# Patient Record
Sex: Female | Born: 1950 | Race: White | Hispanic: No | Marital: Married | Smoking: Former smoker
Health system: Southern US, Community
[De-identification: ages and names within clinical notes are randomized; demographics above are authoritative.]

## PROBLEM LIST (undated history)

## (undated) DIAGNOSIS — E079 Disorder of thyroid, unspecified: Secondary | ICD-10-CM

## (undated) DIAGNOSIS — R9431 Abnormal electrocardiogram [ECG] [EKG]: Secondary | ICD-10-CM

## (undated) DIAGNOSIS — K2960 Other gastritis without bleeding: Secondary | ICD-10-CM

## (undated) DIAGNOSIS — I2510 Atherosclerotic heart disease of native coronary artery without angina pectoris: Secondary | ICD-10-CM

## (undated) DIAGNOSIS — E785 Hyperlipidemia, unspecified: Secondary | ICD-10-CM

## (undated) DIAGNOSIS — J449 Chronic obstructive pulmonary disease, unspecified: Secondary | ICD-10-CM

## (undated) DIAGNOSIS — F1910 Other psychoactive substance abuse, uncomplicated: Secondary | ICD-10-CM

## (undated) DIAGNOSIS — C541 Malignant neoplasm of endometrium: Secondary | ICD-10-CM

## (undated) DIAGNOSIS — M1990 Unspecified osteoarthritis, unspecified site: Secondary | ICD-10-CM

## (undated) DIAGNOSIS — I10 Essential (primary) hypertension: Secondary | ICD-10-CM

## (undated) DIAGNOSIS — C801 Malignant (primary) neoplasm, unspecified: Secondary | ICD-10-CM

## (undated) HISTORY — DX: Unspecified osteoarthritis, unspecified site: M19.90

## (undated) HISTORY — DX: Disorder of thyroid, unspecified: E07.9

## (undated) HISTORY — DX: Atherosclerotic heart disease of native coronary artery without angina pectoris: I25.10

## (undated) HISTORY — DX: Abnormal electrocardiogram (ECG) (EKG): R94.31

## (undated) HISTORY — DX: Hyperlipidemia, unspecified: E78.5

## (undated) HISTORY — PX: TOTAL ABDOMINAL HYSTERECTOMY: SHX209

## (undated) HISTORY — DX: Other gastritis without bleeding: K29.60

## (undated) HISTORY — PX: BREAST CYST ASPIRATION: SHX578

## (undated) HISTORY — DX: Malignant neoplasm of endometrium: C54.1

## (undated) HISTORY — DX: Essential (primary) hypertension: I10

## (undated) HISTORY — DX: Malignant (primary) neoplasm, unspecified: C80.1

## (undated) HISTORY — DX: Other psychoactive substance abuse, uncomplicated: F19.10

---

## 2003-04-12 HISTORY — PX: THYROIDECTOMY: SHX17

## 2004-04-11 HISTORY — PX: CARDIAC CATHETERIZATION: SHX172

## 2005-02-09 ENCOUNTER — Ambulatory Visit: Payer: Self-pay | Admitting: General Practice

## 2005-04-05 ENCOUNTER — Ambulatory Visit (HOSPITAL_COMMUNITY): Admission: RE | Admit: 2005-04-05 | Discharge: 2005-04-05 | Payer: Self-pay | Admitting: *Deleted

## 2006-03-07 ENCOUNTER — Ambulatory Visit: Payer: Self-pay | Admitting: General Practice

## 2006-05-18 ENCOUNTER — Other Ambulatory Visit: Payer: Self-pay

## 2006-05-18 ENCOUNTER — Emergency Department: Payer: Self-pay | Admitting: Internal Medicine

## 2007-06-01 ENCOUNTER — Emergency Department: Payer: Self-pay | Admitting: Emergency Medicine

## 2007-06-01 ENCOUNTER — Other Ambulatory Visit: Payer: Self-pay

## 2007-09-22 ENCOUNTER — Emergency Department: Payer: Self-pay | Admitting: Emergency Medicine

## 2007-12-20 ENCOUNTER — Ambulatory Visit: Payer: Self-pay | Admitting: Internal Medicine

## 2008-04-18 ENCOUNTER — Ambulatory Visit: Payer: Self-pay | Admitting: Specialist

## 2009-04-11 DIAGNOSIS — C541 Malignant neoplasm of endometrium: Secondary | ICD-10-CM

## 2009-04-11 HISTORY — DX: Malignant neoplasm of endometrium: C54.1

## 2009-06-09 ENCOUNTER — Ambulatory Visit: Payer: Self-pay | Admitting: Gynecologic Oncology

## 2009-06-16 ENCOUNTER — Ambulatory Visit: Payer: Self-pay | Admitting: Gynecologic Oncology

## 2009-06-19 ENCOUNTER — Ambulatory Visit: Payer: Self-pay | Admitting: Obstetrics and Gynecology

## 2009-07-10 ENCOUNTER — Ambulatory Visit: Payer: Self-pay | Admitting: Gynecologic Oncology

## 2009-07-13 ENCOUNTER — Ambulatory Visit: Payer: Self-pay | Admitting: Obstetrics and Gynecology

## 2009-07-14 ENCOUNTER — Inpatient Hospital Stay: Payer: Self-pay | Admitting: Obstetrics and Gynecology

## 2009-08-09 ENCOUNTER — Ambulatory Visit: Payer: Self-pay | Admitting: Gynecologic Oncology

## 2009-09-09 ENCOUNTER — Ambulatory Visit: Payer: Self-pay | Admitting: Gynecologic Oncology

## 2010-06-05 ENCOUNTER — Emergency Department: Payer: Self-pay | Admitting: Emergency Medicine

## 2011-02-09 ENCOUNTER — Other Ambulatory Visit: Payer: Self-pay | Admitting: Internal Medicine

## 2011-04-20 ENCOUNTER — Ambulatory Visit: Payer: Self-pay | Admitting: Internal Medicine

## 2011-04-27 ENCOUNTER — Ambulatory Visit (INDEPENDENT_AMBULATORY_CARE_PROVIDER_SITE_OTHER): Payer: BC Managed Care – PPO | Admitting: Internal Medicine

## 2011-04-27 ENCOUNTER — Encounter: Payer: Self-pay | Admitting: Internal Medicine

## 2011-04-27 VITALS — BP 146/90 | HR 84 | Temp 98.0°F | Wt 197.0 lb

## 2011-04-27 DIAGNOSIS — E079 Disorder of thyroid, unspecified: Secondary | ICD-10-CM | POA: Insufficient documentation

## 2011-04-27 DIAGNOSIS — Z720 Tobacco use: Secondary | ICD-10-CM

## 2011-04-27 DIAGNOSIS — J449 Chronic obstructive pulmonary disease, unspecified: Secondary | ICD-10-CM

## 2011-04-27 DIAGNOSIS — E039 Hypothyroidism, unspecified: Secondary | ICD-10-CM

## 2011-04-27 DIAGNOSIS — M1990 Unspecified osteoarthritis, unspecified site: Secondary | ICD-10-CM | POA: Insufficient documentation

## 2011-04-27 DIAGNOSIS — Z716 Tobacco abuse counseling: Secondary | ICD-10-CM

## 2011-04-27 DIAGNOSIS — I10 Essential (primary) hypertension: Secondary | ICD-10-CM

## 2011-04-27 DIAGNOSIS — Z1239 Encounter for other screening for malignant neoplasm of breast: Secondary | ICD-10-CM

## 2011-04-27 DIAGNOSIS — K219 Gastro-esophageal reflux disease without esophagitis: Secondary | ICD-10-CM

## 2011-04-27 DIAGNOSIS — C541 Malignant neoplasm of endometrium: Secondary | ICD-10-CM | POA: Insufficient documentation

## 2011-04-27 DIAGNOSIS — Z7189 Other specified counseling: Secondary | ICD-10-CM

## 2011-04-27 DIAGNOSIS — F17200 Nicotine dependence, unspecified, uncomplicated: Secondary | ICD-10-CM

## 2011-04-27 DIAGNOSIS — C801 Malignant (primary) neoplasm, unspecified: Secondary | ICD-10-CM

## 2011-04-27 DIAGNOSIS — E785 Hyperlipidemia, unspecified: Secondary | ICD-10-CM

## 2011-04-27 DIAGNOSIS — A048 Other specified bacterial intestinal infections: Secondary | ICD-10-CM

## 2011-04-27 DIAGNOSIS — Z79899 Other long term (current) drug therapy: Secondary | ICD-10-CM

## 2011-04-27 MED ORDER — ALPRAZOLAM 0.5 MG PO TABS: 0.5000 mg | ORAL_TABLET | Freq: Every evening | ORAL | Status: AC | PRN

## 2011-04-27 MED ORDER — ALPRAZOLAM 0.5 MG PO TBDP: 0.5000 mg | ORAL_TABLET | Freq: Two times a day (BID) | ORAL | Status: DC | PRN

## 2011-04-27 MED ORDER — DULOXETINE HCL 60 MG PO CPEP: 60.0000 mg | ORAL_CAPSULE | Freq: Every day | ORAL | Status: DC

## 2011-04-27 NOTE — Telephone Encounter (Signed)
Xanax called to [facility] 

## 2011-04-27 NOTE — Patient Instructions (Signed)
I am prescribing alprazolam, a mild anxiety medication for you to use as needed fore anxiety, to help you quit smoking.     Gastroesophageal Reflux Disease, Adult Gastroesophageal reflux disease (GERD) happens when acid from your stomach flows up into the esophagus. When acid comes in contact with the esophagus, the acid causes soreness (inflammation) in the esophagus. Over time, GERD may create small holes (ulcers) in the lining of the esophagus. CAUSES   Increased body weight. This puts pressure on the stomach, making acid rise from the stomach into the esophagus.   Smoking. This increases acid production in the stomach.   Drinking alcohol. This causes decreased pressure in the lower esophageal sphincter (valve or ring of muscle between the esophagus and stomach), allowing acid from the stomach into the esophagus.   Late evening meals and a full stomach. This increases pressure and acid production in the stomach.   A malformed lower esophageal sphincter.  Sometimes, no cause is found. SYMPTOMS   Burning pain in the lower part of the mid-chest behind the breastbone and in the mid-stomach area. This may occur twice a week or more often.   Trouble swallowing.   Sore throat.   Dry cough.   Asthma-like symptoms including chest tightness, shortness of breath, or wheezing.  DIAGNOSIS  Your caregiver may be able to diagnose GERD based on your symptoms. In some cases, X-rays and other tests may be done to check for complications or to check the condition of your stomach and esophagus. TREATMENT  Your caregiver may recommend over-the-counter or prescription medicines to help decrease acid production. Ask your caregiver before starting or adding any new medicines.  HOME CARE INSTRUCTIONS   Change the factors that you can control. Ask your caregiver for guidance concerning weight loss, quitting smoking, and alcohol consumption.   Avoid foods and drinks that make your symptoms worse, such  as:   Caffeine or alcoholic drinks.   Chocolate.   Peppermint or mint flavorings.   Garlic and onions.   Spicy foods.   Citrus fruits, such as oranges, lemons, or limes.   Tomato-based foods such as sauce, chili, salsa, and pizza.   Fried and fatty foods.   Avoid lying down for the 3 hours prior to your bedtime or prior to taking a nap.   Eat small, frequent meals instead of large meals.   Wear loose-fitting clothing. Do not wear anything tight around your waist that causes pressure on your stomach.   Raise the head of your bed 6 to 8 inches with wood blocks to help you sleep. Extra pillows will not help.   Only take over-the-counter or prescription medicines for pain, discomfort, or fever as directed by your caregiver.   Do not take aspirin, ibuprofen, or other nonsteroidal anti-inflammatory drugs (NSAIDs).  SEEK IMMEDIATE MEDICAL CARE IF:   You have pain in your arms, neck, jaw, teeth, or back.   Your pain increases or changes in intensity or duration.   You develop nausea, vomiting, or sweating (diaphoresis).   You develop shortness of breath, or you faint.   Your vomit is green, yellow, black, or looks like coffee grounds or blood.   Your stool is red, bloody, or black.  These symptoms could be signs of other problems, such as heart disease, gastric bleeding, or esophageal bleeding. MAKE SURE YOU:   Understand these instructions.   Will watch your condition.   Will get help right away if you are not doing well or get worse.  Document Released: 01/05/2005 Document Revised: 12/08/2010 Document Reviewed: 10/15/2010 [facility] Patient Information 2012 [place], [place].

## 2011-04-27 NOTE — Progress Notes (Signed)
Subjective:    Patient ID: Pamela Romero, female    DOB: 03-May-1950, 61 y.o.   MRN: [id number]  HPI 31 Pamela Romero is a 61 year old white female with a history of hypothyroidism chronic tobacco abuse osteoarthritis venous insufficiency and endometrial cancer who returns for followup after an absence of 14 months her chief complaint today is increased reflux aggravated by fried foods. She has been intermittently successful with tobacco cessation.  . She has also been intermittently successful with weight loss having achieved a weight loss of roughly 80 pounds over the last year and 2 months with some regain due to emotional stressors at home which have interfered with her ability to exercise regularly. Her ability to exercise is also hindered by her chronic foot pain. She is working only one job where previously she was working 2. Home emotional stressors include the fact that her mother Rennie Plowman has been admitted to behavioral health during an alcohol binge with suicidal tendencies. She states that her mother has been drinking and abusing alcohol steadily for the last year or so and her brother is her drinking partner.   Past Medical History  Diagnosis Date  . Hypertension   . Hyperlipidemia   . Reflux gastritis   . Vaginal delivery     x 2  . CAD (coronary artery disease)     non critical, by cardiac cath 2006  . Abnormal EKG   . Endometrial adenocarcinoma 2011  . Thyroid disease   . Osteoarthritis   . Cancer     endometrial Ca   Current Outpatient Prescriptions on File Prior to Visit  Medication Sig Dispense Refill  . traMADol (ULTRAM) 50 MG tablet TAKE 2 TABLETS BY MOUTH TWICE A DAY AS NEEDED  120 tablet  2    Review of Systems  Constitutional: Negative for fever, chills and unexpected weight change.  HENT: Negative for hearing loss, ear pain, nosebleeds, congestion, sore throat, facial swelling, rhinorrhea, sneezing, mouth sores, trouble swallowing, neck pain, neck stiffness,  voice change, postnasal drip, sinus pressure, tinnitus and ear discharge.   Eyes: Negative for pain, discharge, redness and visual disturbance.  Respiratory: Negative for cough, chest tightness, shortness of breath, wheezing and stridor.   Cardiovascular: Negative for chest pain, palpitations and leg swelling.  Musculoskeletal: Positive for arthralgias. Negative for myalgias.  Skin: Negative for color change and rash.  Neurological: Negative for dizziness, weakness, light-headedness and headaches.  Hematological: Negative for adenopathy.   BP 146/90  Pulse 84  Temp(Src) 98 F (36.7 C) (Oral)  Wt 197 lb (89.359 kg)  SpO2 97%     Objective:   Physical Exam  Constitutional: She is oriented to person, place, and time. She appears well-developed and well-nourished.  HENT:  Mouth/Throat: Oropharynx is clear and moist.  Eyes: EOM are normal. Pupils are equal, round, and reactive to light. No scleral icterus.  Neck: Normal range of motion. Neck supple. No JVD present. No thyromegaly present.  Cardiovascular: Normal rate, regular rhythm, normal heart sounds and intact distal pulses.   Pulmonary/Chest: Effort normal and breath sounds normal.  Abdominal: Soft. Bowel sounds are normal. She exhibits no mass. There is no tenderness.  Musculoskeletal: Normal range of motion. She exhibits no edema.  Lymphadenopathy:    She has no cervical adenopathy.  Neurological: She is alert and oriented to person, place, and time.  Skin: Skin is warm and dry.  Psychiatric: She has a normal mood and affect.      Assessment & Plan:  Cancer Her endometrial cancer is under the care of observation of Dr. Santiago Bur at [facility] oncology. Records requested .  Esophageal reflux Discussed triggers for esophageal reflux and recommended use of omeprazole 20 mg daily. If symptoms are continuing to be present during daily use of omeprazole will refer to gastroenterology for endoscopy to rule out Barrett's  esophagus.  Osteoarthritis Her osteoarthritis is causing constant pain. Her constipation is contributing to her depressive disorder. We discussed changing her antidepressant from Lexapro to Cymbalta as a trial for management of recurrent chronic pain. She is willing to do this samples of Cymbalta 60 mg daily were given to patient and she was instructed to start the day after stopping her Lexapro.  Hypothyroidism She has a history of hypothyroidism with prior episodes of extremely underactive thyroid due to medication noncompliance. She has been on Synthroid for a few days and has not had a TSH checked in over one year. This will be checked today.  Tobacco abuse counseling She is contemplative of quitting tobacco again but has multiple stressors at home that are contributing to her risk for failure and relapsed. She is not interested in Chantix at this time. We discussed getting her pain under control before trying to set a date for her tobacco cessation. We discussed alternative activities that she may do to relieve stress. I have also offered to prescribe for her a mild anxiolytic that she can use as needed during those periods but often drive her to smoke.   Total time of care with patient today was 60 minutes. This included counseling evaluation and management of medications. Updated Medication List Outpatient Encounter Prescriptions as of 04/27/2011  Medication Sig Dispense Refill  . esomeprazole (NEXIUM) 40 MG capsule Take one daily in the evenings      . levothyroxine (SYNTHROID, LEVOTHROID) 200 MCG tablet Take 200 mcg by mouth daily.      . traMADol (ULTRAM) 50 MG tablet TAKE 2 TABLETS BY MOUTH TWICE A DAY AS NEEDED  120 tablet  2  . ALPRAZolam (XANAX) 0.5 MG tablet Take 1 tablet (0.5 mg total) by mouth at bedtime as needed for sleep or anxiety.  30 tablet  3  . DULoxetine (CYMBALTA) 60 MG capsule Take 1 capsule (60 mg total) by mouth daily.  30 capsule  11  . simvastatin (ZOCOR) 20 MG  tablet Take one by mouth daily      . DISCONTD: ALPRAZolam (NIRAVAM) 0.5 MG dissolvable tablet Take 1 tablet (0.5 mg total) by mouth 2 (two) times daily as needed for anxiety.  60 tablet  2

## 2011-04-28 LAB — COMPREHENSIVE METABOLIC PANEL
Alkaline Phosphatase: 85 U/L (ref 39–117)
CO2: 28 mEq/L (ref 19–32)
Creatinine, Ser: 1.2 mg/dL (ref 0.4–1.2)
GFR: 51.4 mL/min — ABNORMAL LOW (ref >60.00)
Glucose, Bld: 83 mg/dL (ref 70–99)
Total Bilirubin: 0.2 mg/dL — ABNORMAL LOW (ref 0.3–1.2)

## 2011-04-28 LAB — H. PYLORI ANTIBODY, IGG: H Pylori IgG: POSITIVE

## 2011-04-29 ENCOUNTER — Telehealth: Payer: Self-pay | Admitting: *Deleted

## 2011-04-29 DIAGNOSIS — E039 Hypothyroidism, unspecified: Secondary | ICD-10-CM | POA: Insufficient documentation

## 2011-04-29 DIAGNOSIS — Z716 Tobacco abuse counseling: Secondary | ICD-10-CM | POA: Insufficient documentation

## 2011-04-29 DIAGNOSIS — I10 Essential (primary) hypertension: Secondary | ICD-10-CM | POA: Insufficient documentation

## 2011-04-29 DIAGNOSIS — A048 Other specified bacterial intestinal infections: Secondary | ICD-10-CM

## 2011-04-29 DIAGNOSIS — K219 Gastro-esophageal reflux disease without esophagitis: Secondary | ICD-10-CM | POA: Insufficient documentation

## 2011-04-29 DIAGNOSIS — Z720 Tobacco use: Secondary | ICD-10-CM | POA: Insufficient documentation

## 2011-04-29 MED ORDER — CLARITHROMYCIN 250 MG PO TABS: 500.0000 mg | ORAL_TABLET | Freq: Two times a day (BID) | ORAL | Status: AC

## 2011-04-29 MED ORDER — OMEPRAZOLE 20 MG PO CPDR: 20.0000 mg | DELAYED_RELEASE_CAPSULE | Freq: Every day | ORAL | Status: DC

## 2011-04-29 MED ORDER — AMOXICILLIN 500 MG PO CAPS: 1000.0000 mg | ORAL_CAPSULE | Freq: Two times a day (BID) | ORAL | Status: AC

## 2011-04-29 MED ORDER — LEVOTHYROXINE SODIUM 25 MCG PO TABS: 25.0000 ug | ORAL_TABLET | Freq: Every day | ORAL | Status: DC

## 2011-04-29 MED ORDER — LEVOTHYROXINE SODIUM 200 MCG PO TABS: 200.0000 ug | ORAL_TABLET | Freq: Every day | ORAL | Status: DC

## 2011-04-29 NOTE — Progress Notes (Signed)
Addended by: Duncan Dull on: 04/29/2011 12:07 PM   Modules accepted: Orders

## 2011-04-29 NOTE — Assessment & Plan Note (Signed)
Her osteoarthritis is causing constant pain. Her constipation is contributing to her depressive disorder. We discussed changing her antidepressant from Lexapro to Cymbalta as a trial for management of recurrent chronic pain. She is willing to do this samples of Cymbalta 60 mg daily were given to patient and she was instructed to start the day after stopping her Lexapro.

## 2011-04-29 NOTE — Assessment & Plan Note (Signed)
Discussed triggers for esophageal reflux and recommended use of omeprazole 20 mg daily. If symptoms are continuing to be present during daily use of omeprazole will refer to gastroenterology for endoscopy to rule out Barrett's esophagus.

## 2011-04-29 NOTE — Assessment & Plan Note (Signed)
She is contemplative of quitting tobacco again but has multiple stressors at home that are contributing to her risk for failure and relapsed. She is not interested in Chantix at this time. We discussed getting her pain under control before trying to set a date for her tobacco cessation. We discussed alternative activities that she may do to relieve stress. I have also offered to prescribe for her a mild anxiolytic that she can use as needed during those periods but often drive her to smoke.

## 2011-04-29 NOTE — Telephone Encounter (Signed)
Advised patient of lab results.  She doesn't have any prilosec so will need that sent to pharmacy along with other meds.  [facility] [place].

## 2011-04-29 NOTE — Assessment & Plan Note (Signed)
She has a history of hypothyroidism with prior episodes of extremely underactive thyroid due to medication noncompliance. She has been on Synthroid for a few days and has not had a TSH checked in over one year. This will be checked today.

## 2011-04-29 NOTE — Assessment & Plan Note (Signed)
Her endometrial cancer is under the care of observation of Dr. Santiago Bur at [facility] oncology. Records requested .

## 2011-04-29 NOTE — Telephone Encounter (Signed)
omeprazole 20 mg bid sent to [facility]

## 2011-05-16 ENCOUNTER — Ambulatory Visit: Payer: Self-pay | Admitting: Internal Medicine

## 2011-05-16 DIAGNOSIS — Z0289 Encounter for other administrative examinations: Secondary | ICD-10-CM

## 2011-05-27 ENCOUNTER — Ambulatory Visit (INDEPENDENT_AMBULATORY_CARE_PROVIDER_SITE_OTHER): Payer: BC Managed Care – PPO | Admitting: Internal Medicine

## 2011-05-27 ENCOUNTER — Encounter: Payer: Self-pay | Admitting: Internal Medicine

## 2011-05-27 DIAGNOSIS — I10 Essential (primary) hypertension: Secondary | ICD-10-CM

## 2011-05-27 DIAGNOSIS — K219 Gastro-esophageal reflux disease without esophagitis: Secondary | ICD-10-CM

## 2011-05-27 DIAGNOSIS — K2960 Other gastritis without bleeding: Secondary | ICD-10-CM

## 2011-05-27 DIAGNOSIS — C801 Malignant (primary) neoplasm, unspecified: Secondary | ICD-10-CM

## 2011-05-27 MED ORDER — TRAMADOL HCL 50 MG PO TABS: 50.0000 mg | ORAL_TABLET | Freq: Four times a day (QID) | ORAL | Status: DC | PRN

## 2011-05-27 NOTE — Patient Instructions (Addendum)
For now,  Try 1 alprazolam 30 minutes prior to bedtime,  Repeat in 30 minutes if still wired..    Save the cymbalta trial for your next 2 day break.,  Take it on the way home from  work bc sedation is the most commonly reported issue.    Avoid all aspirin ,  Motrin  Alleve,  exedrin and goody's powders,  because you had a minor GI bleed from irritation of the stomach.

## 2011-05-27 NOTE — Progress Notes (Signed)
Subjective:    Patient ID: Pamela Romero, female    DOB: Jun 02, 1950, 61 y.o.   MRN: [id number]  HPI   Ms. 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 pylori gastritis .  She reports a recent two day episode of abdominal pain accompanied by nausea vomiting and diarrhea. She noticed some black stools and coffee ground emesis during that period of time. She had been taking Goody's powders for chronic leg and back pain. She has completed most of the treated for H. pylori gastritis continues to take a proton pump inhibitor.    her abdominal pain has resolved and she has not taken any Goody's powders in several days. Chief such as Cymbalta samples were given to her for a trial of treated for depression with chronic pain. She was concerned about the side effects she is working less and taking her other medications as prescribed.   Past Medical History  Diagnosis Date  . Hypertension   . Hyperlipidemia   . Reflux gastritis   . Vaginal delivery     x 2  . CAD (coronary artery disease)     non critical, by cardiac cath 2006  . Abnormal EKG   . Endometrial adenocarcinoma 2011  . Thyroid disease   . Osteoarthritis   . Cancer     endometrial Ca   Current Outpatient Prescriptions on File Prior to Visit  Medication Sig Dispense Refill  . DULoxetine (CYMBALTA) 60 MG capsule Take 1 capsule (60 mg total) by mouth daily.  30 capsule  11  . levothyroxine (LEVOTHROID) 25 MCG tablet Take 1 tablet (25 mcg total) by mouth daily. To be taken with the 200 mcg tablet in the morning 40 minutes prior to other medications  90 tablet  1  . levothyroxine (SYNTHROID, LEVOTHROID) 200 MCG tablet Take 1 tablet (200 mcg total) by mouth daily. To be taken with the 25 mcg tablet in the morning 40 minutes prior to other meds and food  90 tablet  1  . omeprazole (PRILOSEC)  20 MG capsule Take 1 capsule (20 mg total) by mouth daily. Two times daily for H pylori  treatment  60 capsule  0  . simvastatin (ZOCOR) 20 MG tablet Take one by mouth daily        Review of Systems  Constitutional: Negative for fever, chills and unexpected weight change.  HENT: Negative for hearing loss, ear pain, nosebleeds, congestion, sore throat, facial swelling, rhinorrhea, sneezing, mouth sores, trouble swallowing, neck pain, neck stiffness, voice change, postnasal drip, sinus pressure, tinnitus and ear discharge.   Eyes: Negative for pain, discharge, redness and visual disturbance.  Respiratory: Negative for cough, chest tightness, shortness of breath, wheezing and stridor.   Cardiovascular: Negative for chest pain, palpitations and leg swelling.  Musculoskeletal: Positive for arthralgias. Negative for myalgias.  Skin: Negative for color change and rash.  Neurological: Negative for dizziness, weakness, light-headedness and headaches.  Hematological: Negative for adenopathy.       Objective:   Physical Exam  Constitutional: She is oriented to person, place, and time. She appears well-developed and well-nourished.  HENT:  Mouth/Throat: Oropharynx is clear and moist.  Eyes: EOM are normal. Pupils are equal, round, and reactive to light. No scleral icterus.  Neck: Normal range of motion. Neck supple. No JVD present. No thyromegaly present.  Cardiovascular: Normal rate,  regular rhythm, normal heart sounds and intact distal pulses.   Pulmonary/Chest: Effort normal and breath sounds normal.  Abdominal: Soft. Bowel sounds are normal. She exhibits no mass. There is no tenderness.  Musculoskeletal: Normal range of motion. She exhibits no edema.  Lymphadenopathy:    She has no cervical adenopathy.  Neurological: She is alert and oriented to person, place, and time.  Skin: Skin is warm and dry.  Psychiatric: She has a normal mood and affect.          Assessment & Plan:

## 2011-05-29 ENCOUNTER — Encounter: Payer: Self-pay | Admitting: Internal Medicine

## 2011-05-29 DIAGNOSIS — K2960 Other gastritis without bleeding: Secondary | ICD-10-CM | POA: Insufficient documentation

## 2011-05-29 NOTE — Assessment & Plan Note (Signed)
A new diagnosis prior elevations. 2 diastolic is elevated readings. She will return in one month and if home readings over repeat reading is elevated will begin therapy with either ACE inhibitor or beta blocker. Will avoid calcium channel blockers since she has tendencies towards venous insufficiency and long hours of work on her feet and this will likely to precipitate fluid retention.

## 2011-05-29 NOTE — Assessment & Plan Note (Signed)
She continues to see Dr. Wendy Poet for management of endometrial cancer diagnosed in 2011. Records requested.

## 2011-05-29 NOTE — Assessment & Plan Note (Signed)
With positive H. pylori testing at last visit due to concerns for persistent abdominal pain. She was treated with 2 drug antibiotic regimen and omeprazole. Her recent episode of epigastric pain and severe gastritis was likely caused by overuse of NSAIDs. She is nonalcohol drinker

## 2011-05-29 NOTE — Assessment & Plan Note (Signed)
In the setting of concurrent H. pylori positive serology. Symptoms are currently quiescent but she did have an episode of what sounds like an upper GI bleed secondary to gastritis. She states her symptoms are currently resolved after taking the H. pylori treatment and continuing omeprazole. She does not want GI consult at this time.

## 2011-07-31 ENCOUNTER — Other Ambulatory Visit: Payer: Self-pay | Admitting: Internal Medicine

## 2011-08-18 ENCOUNTER — Other Ambulatory Visit: Payer: Self-pay | Admitting: Internal Medicine

## 2011-08-18 MED ORDER — ESOMEPRAZOLE MAGNESIUM 40 MG PO CPDR: 40.0000 mg | DELAYED_RELEASE_CAPSULE | Freq: Every day | ORAL | Status: DC

## 2011-08-23 ENCOUNTER — Telehealth: Payer: Self-pay | Admitting: Internal Medicine

## 2011-08-23 MED ORDER — ESOMEPRAZOLE MAGNESIUM 40 MG PO CPDR: 40.0000 mg | DELAYED_RELEASE_CAPSULE | Freq: Every day | ORAL | Status: DC

## 2011-08-23 NOTE — Telephone Encounter (Signed)
[phone] Pt came in needs refill on  nexium  [facility] Pt is completely out of med Please call pt when you call this in  She needs phone number to call [facility] to give them credit card number

## 2011-08-23 NOTE — Telephone Encounter (Signed)
Addended by: Jobie Quaker on: 08/23/2011 04:30 PM   Modules accepted: Orders

## 2011-08-23 NOTE — Telephone Encounter (Signed)
Rx sent to pharmacy. Left message stating that patient will need to look on back of her insurance card for number to call [facility].

## 2011-08-24 ENCOUNTER — Encounter: Payer: Self-pay | Admitting: Internal Medicine

## 2011-09-25 ENCOUNTER — Other Ambulatory Visit: Payer: Self-pay | Admitting: Internal Medicine

## 2011-11-08 ENCOUNTER — Other Ambulatory Visit: Payer: Self-pay | Admitting: Internal Medicine

## 2011-12-08 ENCOUNTER — Encounter: Payer: Self-pay | Admitting: Internal Medicine

## 2012-02-07 ENCOUNTER — Ambulatory Visit (INDEPENDENT_AMBULATORY_CARE_PROVIDER_SITE_OTHER): Payer: BC Managed Care – PPO | Admitting: Internal Medicine

## 2012-02-07 ENCOUNTER — Encounter: Payer: Self-pay | Admitting: Internal Medicine

## 2012-02-07 VITALS — BP 128/74 | HR 84 | Temp 98.7°F | Resp 12 | Ht 65.5 in | Wt 204.5 lb

## 2012-02-07 DIAGNOSIS — Z1239 Encounter for other screening for malignant neoplasm of breast: Secondary | ICD-10-CM

## 2012-02-07 DIAGNOSIS — M1990 Unspecified osteoarthritis, unspecified site: Secondary | ICD-10-CM

## 2012-02-07 DIAGNOSIS — Z79899 Other long term (current) drug therapy: Secondary | ICD-10-CM

## 2012-02-07 DIAGNOSIS — E039 Hypothyroidism, unspecified: Secondary | ICD-10-CM

## 2012-02-07 DIAGNOSIS — Z716 Tobacco abuse counseling: Secondary | ICD-10-CM

## 2012-02-07 DIAGNOSIS — R5383 Other fatigue: Secondary | ICD-10-CM

## 2012-02-07 DIAGNOSIS — F17200 Nicotine dependence, unspecified, uncomplicated: Secondary | ICD-10-CM

## 2012-02-07 DIAGNOSIS — Z23 Encounter for immunization: Secondary | ICD-10-CM

## 2012-02-07 DIAGNOSIS — I10 Essential (primary) hypertension: Secondary | ICD-10-CM

## 2012-02-07 DIAGNOSIS — E785 Hyperlipidemia, unspecified: Secondary | ICD-10-CM

## 2012-02-07 DIAGNOSIS — Z7189 Other specified counseling: Secondary | ICD-10-CM

## 2012-02-07 LAB — CBC WITH DIFFERENTIAL/PLATELET
Basophils Relative: 0.9 % (ref 0.0–3.0)
Eosinophils Relative: 1.2 % (ref 0.0–5.0)
HCT: 37.1 % (ref 36.0–46.0)
Lymphs Abs: 1.5 10*3/uL (ref 0.7–4.0)
Monocytes Relative: 6 % (ref 3.0–12.0)
Platelets: 147 10*3/uL — ABNORMAL LOW (ref 150.0–400.0)
RBC: 3.75 Mil/uL — ABNORMAL LOW (ref 3.87–5.11)
WBC: 5.8 10*3/uL (ref 4.5–10.5)

## 2012-02-07 LAB — COMPREHENSIVE METABOLIC PANEL
BUN: 11 mg/dL (ref 6–23)
CO2: 30 mEq/L (ref 19–32)
Creatinine, Ser: 1.1 mg/dL (ref 0.4–1.2)
GFR: 56.94 mL/min — ABNORMAL LOW (ref >60.00)
Glucose, Bld: 71 mg/dL (ref 70–99)
Sodium: 138 mEq/L (ref 135–145)
Total Bilirubin: 0.3 mg/dL (ref 0.3–1.2)
Total Protein: 6.9 g/dL (ref 6.0–8.3)

## 2012-02-07 LAB — TSH: TSH: 59.72 u[IU]/mL — ABNORMAL HIGH (ref 0.35–5.50)

## 2012-02-07 LAB — LIPID PANEL
Cholesterol: 312 mg/dL — ABNORMAL HIGH (ref 0–200)
HDL: 43.3 mg/dL (ref >39.00)
Triglycerides: 311 mg/dL — ABNORMAL HIGH (ref 0.0–149.0)
VLDL: 62.2 mg/dL — ABNORMAL HIGH (ref 0.0–40.0)

## 2012-02-07 MED ORDER — LEVOTHYROXINE SODIUM 200 MCG PO TABS: 200.0000 ug | ORAL_TABLET | Freq: Every day | ORAL | Status: DC

## 2012-02-07 MED ORDER — OMEPRAZOLE 40 MG PO CPDR: 40.0000 mg | DELAYED_RELEASE_CAPSULE | Freq: Every day | ORAL | Status: DC

## 2012-02-07 MED ORDER — HYDROCODONE-ACETAMINOPHEN 7.5-750 MG PO TABS: 1.0000 | ORAL_TABLET | Freq: Every day | ORAL | Status: DC | PRN

## 2012-02-07 MED ORDER — LEVOTHYROXINE SODIUM 50 MCG PO TABS: 50.0000 ug | ORAL_TABLET | Freq: Every day | ORAL | Status: DC

## 2012-02-07 MED ORDER — TRAMADOL HCL 50 MG PO TABS: 50.0000 mg | ORAL_TABLET | Freq: Four times a day (QID) | ORAL | Status: DC | PRN

## 2012-02-07 NOTE — Patient Instructions (Addendum)
Please return for a repeat thyroid level in 6 weeks,  You do not need to see me for this test,  Just come to the lab  PLEASE QUIT SMOKING

## 2012-02-07 NOTE — Progress Notes (Signed)
Patient ID: Pamela PATERNOSTRO, female   DOB: October 22, 1950, 61 y.o.   MRN: [id number]  Patient Active Problem List  Diagnosis  . Osteoarthritis  . Cancer  . Tobacco abuse counseling  . Tobacco abuse  . Hypothyroidism  . Hypertension goal BP (blood pressure) < 130/80  . Esophageal reflux  . 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 Other and unspecified hyperlipidemia    Subjective:  CC:   Chief Complaint  Patient presents with  . Medication Refill    HPI:   Pamela Romero a 61 y.o. female who presents Lost to followup. She was last seen in January at which time she was noted to be very hypothyroid and her dose was increased. She has not had any thyroid medications in over one month. She is having multiple symptoms including  Blurred vision blurred,  diffuse pain, nonrestorative sleep. She continues to work 2 jobs. At health fairs of her mother and husband have been cited as obstacles to her ability to maintain good followup..  Mother Fulton Mole fell and broke her other hip while drunk.  Mother has now been abstinent of alcohol 3 months,  husband was abusing drugs , went to rehab for 5 months and has been abstinent and employed  for the past 6 months.  Feet still hurting and swelling due to arthritis. Still smoking.   Past Medical History  Diagnosis Date  . Hypertension   . Hyperlipidemia   . Reflux gastritis   . Vaginal delivery     x 2  . CAD (coronary artery disease)     non critical, by cardiac cath 2006  . Abnormal EKG   . Endometrial adenocarcinoma 2011  . Thyroid disease   . Osteoarthritis   . Cancer     endometrial Ca    Past Surgical History  Procedure Date  . Thyroidectomy 2005    partial at [facility], non malignant tumor  . Cardiac catheterization 2006    40% LAD, EF 60%  . Total abdominal hysterectomy     with BSO         The following portions of the patient's history were reviewed and updated as appropriate: Allergies, current medications, and  problem list.    Review of Systems:   12 Pt  review of systems was negative except those addressed in the HPI,     History   Social History  . Marital Status: Married    Spouse Name: N/A    Number of Children: 0  . Years of Education: N/A   Occupational History  . Scientist, physiological- full time   . [employer]- part time    Social History Main Topics  . Smoking status: Current Every Day Smoker  . Smokeless tobacco: Not on file   Comment: 2-3 cigs a day  . Alcohol Use: Yes     Occasional  . Drug Use: Not on file  . Sexually Active: Not on file   Other Topics Concern  . Not on file   Social History Narrative   Lives with spouse, takes care of her mother.Had 2 children, one died at birth, the other given up for adoption (was unmarried).    Objective:  BP 128/74  Pulse 84  Temp 98.7 F (37.1 C) (Oral)  Resp 12  Ht 5' 5.5" (1.664 m)  Wt 204 lb 8 oz (92.761 kg)  BMI 33.51 kg/m2  SpO2 96%  General appearance: alert, cooperative and appears stated age Ears: normal TM's and external ear  canals both ears Throat: lips, mucosa, and tongue normal; teeth and gums normal Neck: no adenopathy, no carotid bruit, supple, symmetrical, trachea midline and thyroid not enlarged, symmetric, no tenderness/mass/nodules Back: symmetric, no curvature. ROM normal. No CVA tenderness. Lungs: clear to auscultation bilaterally Heart: regular rate and rhythm, S1, S2 normal, no murmur, click, rub or gallop Abdomen: soft, non-tender; bowel sounds normal; no masses,  no organomegaly Pulses: 2+ and symmetric Skin: Skin color, texture, turgor normal. No rashes or lesions Lymph nodes: Cervical, supraclavicular, and axillary nodes normal.  Assessment and Plan:  Hypertension goal BP (blood pressure) < 130/80 well controlled   Hypothyroidism Iatrogenic secondary to thyroid surgery. She has not been taking the thyroid medication for over one month. Her last TSH was 55 and her dose was increased to  225 mcg. I have increased it to 250 today.  Baseline TSH is 59 today She will return in 6 weeks for repeat TSH.  Tobacco abuse counseling Counseling given. She is not motivated to quit currently due to home stressors.  Osteoarthritis She has chronic foot pain due to severe arthritis. She cannot take NSAIDs due to history of gastritis. I've refilled her tramadol given her prescription for Vicodin to use once daily for severe pain.  Other and unspecified hyperlipidemia Her triglycerides are elevated today. Will not change her dose of statin until her thyroid is under better control.   Updated Medication List Outpatient Encounter Prescriptions as of 02/07/2012  Medication Sig Dispense Refill  . levothyroxine (SYNTHROID, LEVOTHROID) 200 MCG tablet Take 1 tablet (200 mcg total) by mouth daily. To be taken with the 25 mcg tablet in the morning 40 minutes prior to other meds and food  90 tablet  1  . simvastatin (ZOCOR) 20 MG tablet Take one by mouth daily      . traMADol (ULTRAM) 50 MG tablet Take 1 tablet (50 mg total) by mouth every 6 (six) hours as needed for pain.  120 tablet  3  . DISCONTD: DULoxetine (CYMBALTA) 60 MG capsule Take 1 capsule (60 mg total) by mouth daily.  30 capsule  11  . DISCONTD: esomeprazole (NEXIUM) 40 MG capsule Take 1 capsule (40 mg total) by mouth daily.  90 capsule  3  . DISCONTD: levothyroxine (LEVOTHROID) 25 MCG tablet Take 1 tablet (25 mcg total) by mouth daily. To be taken with the 200 mcg tablet in the morning 40 minutes prior to other medications  90 tablet  1  . DISCONTD: levothyroxine (SYNTHROID, LEVOTHROID) 200 MCG tablet Take 1 tablet (200 mcg total) by mouth daily. To be taken with the 25 mcg tablet in the morning 40 minutes prior to other meds and food  90 tablet  1  . DISCONTD: omeprazole (PRILOSEC) 20 MG capsule TAKE 1 CAPSULE TWICE DAILY FOR H-PYLORI TREATMENT  60 capsule  0  . DISCONTD: traMADol (ULTRAM) 50 MG tablet TAKE 1 TABLET EVERY 6 HOURS AS NEEDED  FOR PAIN  120 tablet  2  . HYDROcodone-acetaminophen (VICODIN ES) 7.5-750 MG per tablet Take 1 tablet by mouth daily as needed for pain.  30 tablet  2  . levothyroxine (SYNTHROID, LEVOTHROID) 50 MCG tablet Take 1 tablet (50 mcg total) by mouth daily.  30 tablet  3  . omeprazole (PRILOSEC) 40 MG capsule Take 1 capsule (40 mg total) by mouth daily.  30 capsule  3     Orders Placed This Encounter  Procedures  . MM Digital Screening  . Tdap vaccine greater than or equal to  7yo IM  . Pneumococcal polysaccharide vaccine 23-valent greater than or equal to 2yo subcutaneous/IM  . Lipid panel  . LDL cholesterol, direct  . Comprehensive metabolic panel  . CBC with Differential  . TSH    Return in about 6 months (around 08/07/2012).

## 2012-02-08 ENCOUNTER — Encounter: Payer: Self-pay | Admitting: Internal Medicine

## 2012-02-08 DIAGNOSIS — E785 Hyperlipidemia, unspecified: Secondary | ICD-10-CM | POA: Insufficient documentation

## 2012-02-08 NOTE — Assessment & Plan Note (Signed)
Her triglycerides are elevated today. Will not change her dose of statin until her thyroid is under better control.

## 2012-02-08 NOTE — Assessment & Plan Note (Signed)
well controlled  

## 2012-02-08 NOTE — Addendum Note (Signed)
Addended by: Sherlene Shams on: 02/08/2012 01:19 PM   Modules accepted: Orders

## 2012-02-08 NOTE — Assessment & Plan Note (Signed)
She has chronic foot pain due to severe arthritis. She cannot take NSAIDs due to history of gastritis. I've refilled her tramadol given her prescription for Vicodin to use once daily for severe pain.

## 2012-02-08 NOTE — Assessment & Plan Note (Signed)
Iatrogenic secondary to thyroid surgery. She has not been taking the thyroid medication for over one month. Her last TSH was 55 and her dose was increased to 225 mcg. I have increased it to 250 today.  Baseline TSH is 59 today She will return in 6 weeks for repeat TSH.

## 2012-02-08 NOTE — Assessment & Plan Note (Signed)
Counseling given. She is not motivated to quit currently due to home stressors.

## 2012-03-20 ENCOUNTER — Ambulatory Visit: Payer: BC Managed Care – PPO | Admitting: Internal Medicine

## 2012-03-28 ENCOUNTER — Ambulatory Visit: Payer: Self-pay | Admitting: Internal Medicine

## 2012-03-30 ENCOUNTER — Telehealth: Payer: Self-pay | Admitting: Internal Medicine

## 2012-03-30 ENCOUNTER — Ambulatory Visit (INDEPENDENT_AMBULATORY_CARE_PROVIDER_SITE_OTHER): Payer: BC Managed Care – PPO | Admitting: Internal Medicine

## 2012-03-30 ENCOUNTER — Encounter: Payer: Self-pay | Admitting: Internal Medicine

## 2012-03-30 VITALS — BP 140/80 | HR 80 | Temp 98.3°F | Resp 12 | Ht 68.0 in | Wt 206.8 lb

## 2012-03-30 DIAGNOSIS — Z720 Tobacco use: Secondary | ICD-10-CM

## 2012-03-30 DIAGNOSIS — R51 Headache: Secondary | ICD-10-CM

## 2012-03-30 DIAGNOSIS — H53129 Transient visual loss, unspecified eye: Secondary | ICD-10-CM

## 2012-03-30 DIAGNOSIS — E785 Hyperlipidemia, unspecified: Secondary | ICD-10-CM

## 2012-03-30 DIAGNOSIS — Z716 Tobacco abuse counseling: Secondary | ICD-10-CM

## 2012-03-30 DIAGNOSIS — H53123 Transient visual loss, bilateral: Secondary | ICD-10-CM

## 2012-03-30 DIAGNOSIS — R928 Other abnormal and inconclusive findings on diagnostic imaging of breast: Secondary | ICD-10-CM

## 2012-03-30 DIAGNOSIS — E039 Hypothyroidism, unspecified: Secondary | ICD-10-CM

## 2012-03-30 DIAGNOSIS — F17200 Nicotine dependence, unspecified, uncomplicated: Secondary | ICD-10-CM

## 2012-03-30 DIAGNOSIS — H539 Unspecified visual disturbance: Secondary | ICD-10-CM

## 2012-03-30 DIAGNOSIS — Z7189 Other specified counseling: Secondary | ICD-10-CM

## 2012-03-30 DIAGNOSIS — Z79899 Other long term (current) drug therapy: Secondary | ICD-10-CM

## 2012-03-30 DIAGNOSIS — R569 Unspecified convulsions: Secondary | ICD-10-CM

## 2012-03-30 LAB — LDL CHOLESTEROL, DIRECT: Direct LDL: 148.8 mg/dL

## 2012-03-30 LAB — COMPREHENSIVE METABOLIC PANEL
ALT: 13 U/L (ref 0–35)
AST: 18 U/L (ref 0–37)
Albumin: 3.6 g/dL (ref 3.5–5.2)
BUN: 11 mg/dL (ref 6–23)
Calcium: 8.8 mg/dL (ref 8.4–10.5)
Chloride: 107 mEq/L (ref 96–112)
Potassium: 3.9 mEq/L (ref 3.5–5.1)

## 2012-03-30 LAB — TSH: TSH: 19.92 u[IU]/mL — ABNORMAL HIGH (ref 0.35–5.50)

## 2012-03-30 NOTE — Patient Instructions (Signed)
Start taking a baby aspirin  Daily  MRI ordered of brain to investigate visual changes. Along with [facility] neurology eval

## 2012-03-30 NOTE — Telephone Encounter (Signed)
Her mammogram was incomplete on the right and they are advising additional views.  I will order for next week

## 2012-03-30 NOTE — Progress Notes (Signed)
Patient ID: Pamela Romero, female   DOB: 17-Dec-1950, 61 y.o.   MRN: [id number]  Patient Active Problem List  Diagnosis  . Osteoarthritis  . Cancer  . Tobacco abuse counseling  . Tobacco abuse  . Hypothyroidism  . Hypertension goal BP (blood pressure) < 130/80  . Esophageal reflux  . 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 Other and unspecified hyperlipidemia  . Transient vision disturbance, bilateral    Subjective:  CC:   Chief Complaint  Patient presents with  . Follow-up    HPI:   Pamela Romero a 61 y.o. female who presents for follow up on hypothyroidism,  Tobacco abuse,  Hyperlipidemia.  She has resumed her medications and has ben adherent over 90% of the time since her last visit She has a nNew cc:  Recurrent episodes of total vision loss  Described as  "a cloud comes over" visual and her visual field  Becomes completely whited out.  The episodes last for a total of 5 to 10 seconds before resolving spontaneously.   The onset was 2 years ago,  occurs about twice a month.  It has occurred while driving. Accompanied by left frontal headache, feelings of panic and dyspnea without chest pain or wheezing.   The headache resolves swiftly,  After about a minute.     Past Medical History  Diagnosis Date  . Hypertension   . Hyperlipidemia   . Reflux gastritis   . Vaginal delivery     x 2  . CAD (coronary artery disease)     non critical, by cardiac cath 2006  . Abnormal EKG   . Endometrial adenocarcinoma 2011  . Thyroid disease   . Osteoarthritis   . Cancer     endometrial Ca  . tobacco abuse     Past Surgical History  Procedure Date  . Thyroidectomy 2005    partial at [facility], non malignant tumor  . Cardiac catheterization 2006    40% LAD, EF 60%  . Total abdominal hysterectomy     with BSO         The following portions of the patient's history were reviewed and updated as appropriate: Allergies, current medications, and problem  list.    Review of Systems:   12 Pt  review of systems was negative except those addressed in the HPI,     History   Social History  . Marital Status: Married    Spouse Name: N/A    Number of Children: 0  . Years of Education: N/A   Occupational History  . Scientist, physiological- full time   . [employer]- part time    Social History Main Topics  . Smoking status: Current Every Day Smoker  . Smokeless tobacco: Not on file     Comment: 2-3 cigs a day  . Alcohol Use: Yes     Comment: Occasional  . Drug Use: Not on file  . Sexually Active: Not on file   Other Topics Concern  . Not on file   Social History Narrative   Lives with spouse, takes care of her mother.Had 2 children, one died at birth, the other given up for adoption (was unmarried).    Objective:  BP 140/80  Pulse 80  Temp 98.3 F (36.8 C) (Oral)  Resp 12  Ht 5\' 8"  (1.727 m)  Wt 206 lb 12 oz (93.781 kg)  BMI 31.44 kg/m2  SpO2 98%  General appearance: alert, cooperative and appears stated age Ears: normal TM's  and external ear canals both ears Throat: lips, mucosa, and tongue normal; teeth and gums normal Neck: no adenopathy, no carotid bruit, supple, symmetrical, trachea midline and thyroid not enlarged, symmetric, no tenderness/mass/nodules Back: symmetric, no curvature. ROM normal. No CVA tenderness. Lungs: clear to auscultation bilaterally Heart: regular rate and rhythm, S1, S2 normal, no murmur, click, rub or gallop Abdomen: soft, non-tender; bowel sounds normal; no masses,  no organomegaly Pulses: 2+ and symmetric Skin: Skin color, texture, turgor normal. No rashes or lesions Lymph nodes: Cervical, supraclavicular, and axillary nodes normal.  Assessment and Plan:  Hypothyroidism She has a long history of medication noncompliance. She has been using her medication for the last 6 weeks with over 90% appearance. TSh is 20 on current dose.  Increase needed.  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 Given her history of tobacco abuse am concerned about TIAs. I've ordered an MRI MRA of the brain. I recommended she start taking a baby aspirin daily and continue her statin.  Tobacco abuse Lifelong. Prior attempts to quit a been successful for more than several days. Aggravated by stress.  Tobacco abuse counseling Risks of continued tobacco use were discussed. She is currently interested in tobacco cessation She is considering trial of Chantix. I've given her a coupon cannot her copayments down.  Other and unspecified hyperlipidemia She will return for fasting lipids   Updated Medication List Outpatient Encounter Prescriptions as of 03/30/2012  Medication Sig Dispense Refill  . HYDROcodone-acetaminophen (VICODIN ES) 7.5-750 MG per tablet Take 1 tablet by mouth daily as needed for pain.  30 tablet  2  . levothyroxine (SYNTHROID, LEVOTHROID) 200 MCG tablet Take 1 tablet (200 mcg total) by mouth daily. To be taken with the 25 mcg tablet in the morning 40 minutes prior to other meds and food  90 tablet  1  . levothyroxine (SYNTHROID, LEVOTHROID) 50 MCG tablet Take 1.5 tablets (75 mcg total) by mouth daily. Take with the 200 mcg dose  30 tablet  3  . omeprazole (PRILOSEC) 40 MG capsule Take 1 capsule (40 mg total) by mouth daily.  30 capsule  3  . simvastatin (ZOCOR) 20 MG tablet Take one by mouth daily      . traMADol (ULTRAM) 50 MG tablet Take 1 tablet (50 mg total) by mouth every 6 (six) hours as needed for pain.  120 tablet  3  . [DISCONTINUED] levothyroxine (SYNTHROID, LEVOTHROID) 200 MCG tablet Take 1 tablet (200 mcg total) by mouth daily. To be taken with the 25 mcg tablet in the morning 40 minutes prior to other meds and food  90 tablet  1  . [DISCONTINUED] levothyroxine (SYNTHROID, LEVOTHROID) 50 MCG tablet Take 1 tablet (50 mcg total) by mouth daily.  30 tablet  3     Orders Placed This Encounter  Procedures  . MR  MRA Head/Brain Wo Cm  . MR Brain W Wo Contrast  . LDL cholesterol, direct  . Comprehensive metabolic panel  . Sedimentation rate  . Prolactin  . Ambulatory referral to Neurology    No Follow-up on file.

## 2012-04-01 MED ORDER — LEVOTHYROXINE SODIUM 50 MCG PO TABS: 75.0000 ug | ORAL_TABLET | Freq: Every day | ORAL | Status: DC

## 2012-04-01 MED ORDER — LEVOTHYROXINE SODIUM 200 MCG PO TABS: 200.0000 ug | ORAL_TABLET | Freq: Every day | ORAL | Status: DC

## 2012-04-01 NOTE — Assessment & Plan Note (Addendum)
She has a long history of medication noncompliance. She has been using her medication for the last 6 weeks with over 90% appearance. TSh is 20 on current dose.  Increase needed.

## 2012-04-02 ENCOUNTER — Encounter: Payer: Self-pay | Admitting: Internal Medicine

## 2012-04-02 ENCOUNTER — Other Ambulatory Visit: Payer: Self-pay | Admitting: Internal Medicine

## 2012-04-02 DIAGNOSIS — H539 Unspecified visual disturbance: Secondary | ICD-10-CM | POA: Insufficient documentation

## 2012-04-02 MED ORDER — HYDROCODONE-ACETAMINOPHEN 7.5-750 MG PO TABS: 1.0000 | ORAL_TABLET | Freq: Every day | ORAL | Status: DC | PRN

## 2012-04-02 NOTE — Telephone Encounter (Signed)
Fine to refill 1 month of the Hydrocodone. 7.5/300, 1 pill daily as needed for osteoarthritis pain, disp 30, no refill.

## 2012-04-02 NOTE — Telephone Encounter (Signed)
Med filled.  

## 2012-04-02 NOTE — Telephone Encounter (Signed)
Called patient to inform of mammogram status;no answer

## 2012-04-02 NOTE — Assessment & Plan Note (Signed)
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 Given her history of tobacco abuse am concerned about TIAs. I've ordered an MRI MRA of the brain. I recommended she start taking a baby aspirin daily and continue her statin.

## 2012-04-02 NOTE — Telephone Encounter (Signed)
Pamela Romero needs new Rx for her  Hydrocodone /APAP because 7.5/750 no longer available, please send Rx for 7.5/325 or 7.5/300. Thanks [facility] phone# [phone].

## 2012-04-02 NOTE — Assessment & Plan Note (Signed)
She will return for fasting lipids

## 2012-04-02 NOTE — Assessment & Plan Note (Signed)
Lifelong. Prior attempts to quit a been successful for more than several days. Aggravated by stress.

## 2012-04-02 NOTE — Assessment & Plan Note (Signed)
Risks of continued tobacco use were discussed. She is currently interested in tobacco cessation She is considering trial of Chantix. I've given her a coupon cannot her copayments down.

## 2012-04-03 LAB — HM MAMMOGRAPHY: HM Mammogram: ABNORMAL

## 2012-04-06 ENCOUNTER — Ambulatory Visit: Payer: Self-pay | Admitting: Internal Medicine

## 2012-04-06 NOTE — Telephone Encounter (Signed)
Patient notified of results.

## 2012-04-08 ENCOUNTER — Telehealth: Payer: Self-pay | Admitting: Internal Medicine

## 2012-04-08 NOTE — Telephone Encounter (Signed)
MRI of brain and MRA of brain circulation was normal.  I still think she needs to see a neurologist and have initiated a referral to  Dr. Sherryll Burger.

## 2012-04-09 NOTE — Telephone Encounter (Signed)
Pt notified ans states understanding.

## 2012-04-10 ENCOUNTER — Ambulatory Visit: Payer: Self-pay | Admitting: Internal Medicine

## 2012-04-11 HISTORY — PX: BREAST CYST ASPIRATION: SHX578

## 2012-04-12 ENCOUNTER — Telehealth: Payer: Self-pay | Admitting: Internal Medicine

## 2012-04-12 DIAGNOSIS — N63 Unspecified lump in unspecified breast: Secondary | ICD-10-CM

## 2012-04-12 NOTE — Telephone Encounter (Signed)
Pamela Romero's diagnostic mammogram was abnormal.  Please get her on the phone so I can talk to her.

## 2012-04-12 NOTE — Telephone Encounter (Signed)
Patient advised of persistent mass in right breast and recommended biopsy.  She has asked me to choose the surgeon.   Referral to Pamela Romero and Pamela Romero underway for bopsy of mass right breast

## 2012-04-12 NOTE — Telephone Encounter (Signed)
Patient notified

## 2012-04-13 ENCOUNTER — Telehealth: Payer: Self-pay | Admitting: *Deleted

## 2012-04-13 NOTE — Telephone Encounter (Signed)
Notified Christie  At [facility] of patient referral has been done to [facility].

## 2012-04-13 NOTE — Telephone Encounter (Signed)
Pamela Romero CALLED FROM [facility] [phone] CONCERNING PATIENT ABNORMAL MAMOGRAM REPORT ON 04/10/2012 TO SET UP SURGICAL CONSULT APPT.[name] PLEASE ADVISE.

## 2012-04-13 NOTE — Telephone Encounter (Signed)
Referral has already been made to [facility]

## 2012-04-16 ENCOUNTER — Telehealth: Payer: Self-pay | Admitting: *Deleted

## 2012-04-16 NOTE — Telephone Encounter (Signed)
7.5/325  thanks

## 2012-04-16 NOTE — Telephone Encounter (Signed)
Received a call from Leonette Most at the pharmacy stating that Vicodin only comes in the following strengths now: 7.5-325mg  or 7.5-500mg .  Which strength would you like to change the patient to?  Please advise.

## 2012-04-17 MED ORDER — HYDROCODONE-ACETAMINOPHEN 7.5-325 MG PO TABS: 1.0000 | ORAL_TABLET | Freq: Every day | ORAL | Status: DC | PRN

## 2012-04-17 NOTE — Telephone Encounter (Signed)
Pharmacy notified.

## 2012-04-19 ENCOUNTER — Encounter: Payer: Self-pay | Admitting: Internal Medicine

## 2012-04-30 ENCOUNTER — Encounter: Payer: Self-pay | Admitting: Internal Medicine

## 2012-06-09 ENCOUNTER — Telehealth: Payer: Self-pay | Admitting: Internal Medicine

## 2012-06-09 HISTORY — PX: BREAST SURGERY: SHX581

## 2012-06-10 MED ORDER — TRAMADOL HCL 50 MG PO TABS: ORAL_TABLET | ORAL | Status: DC

## 2012-06-10 NOTE — Addendum Note (Signed)
Addended by: Sherlene Shams on: 06/10/2012 11:37 PM   Modules accepted: Orders

## 2012-06-10 NOTE — Telephone Encounter (Signed)
Attempts to email failed .  Please phone in refill,

## 2012-06-11 NOTE — Telephone Encounter (Signed)
Rx called to pharmacy

## 2012-10-02 ENCOUNTER — Encounter: Payer: Self-pay | Admitting: Internal Medicine

## 2012-10-02 ENCOUNTER — Ambulatory Visit (INDEPENDENT_AMBULATORY_CARE_PROVIDER_SITE_OTHER): Payer: BC Managed Care – PPO | Admitting: Internal Medicine

## 2012-10-02 VITALS — BP 144/98 | HR 83 | Temp 97.9°F | Resp 16 | Wt 212.5 lb

## 2012-10-02 DIAGNOSIS — M1990 Unspecified osteoarthritis, unspecified site: Secondary | ICD-10-CM

## 2012-10-02 DIAGNOSIS — Z7189 Other specified counseling: Secondary | ICD-10-CM

## 2012-10-02 DIAGNOSIS — K5900 Constipation, unspecified: Secondary | ICD-10-CM | POA: Insufficient documentation

## 2012-10-02 DIAGNOSIS — F17200 Nicotine dependence, unspecified, uncomplicated: Secondary | ICD-10-CM

## 2012-10-02 DIAGNOSIS — Z1211 Encounter for screening for malignant neoplasm of colon: Secondary | ICD-10-CM

## 2012-10-02 DIAGNOSIS — E785 Hyperlipidemia, unspecified: Secondary | ICD-10-CM

## 2012-10-02 DIAGNOSIS — K2990 Gastroduodenitis, unspecified, without bleeding: Secondary | ICD-10-CM

## 2012-10-02 DIAGNOSIS — K2970 Gastritis, unspecified, without bleeding: Secondary | ICD-10-CM

## 2012-10-02 DIAGNOSIS — Z716 Tobacco abuse counseling: Secondary | ICD-10-CM

## 2012-10-02 DIAGNOSIS — E039 Hypothyroidism, unspecified: Secondary | ICD-10-CM

## 2012-10-02 DIAGNOSIS — Z79899 Other long term (current) drug therapy: Secondary | ICD-10-CM

## 2012-10-02 MED ORDER — DOCUSATE SODIUM 100 MG PO CAPS: 100.0000 mg | ORAL_CAPSULE | Freq: Two times a day (BID) | ORAL | Status: DC

## 2012-10-02 MED ORDER — TRAMADOL HCL 50 MG PO TABS: 100.0000 mg | ORAL_TABLET | Freq: Four times a day (QID) | ORAL | Status: DC | PRN

## 2012-10-02 MED ORDER — MELOXICAM 15 MG PO TABS: 15.0000 mg | ORAL_TABLET | Freq: Every day | ORAL | Status: DC

## 2012-10-02 NOTE — Assessment & Plan Note (Signed)
Increasing tramadol to 8 daily.

## 2012-10-02 NOTE — Assessment & Plan Note (Signed)
Referral for colonoscopy °

## 2012-10-02 NOTE — Assessment & Plan Note (Signed)
Due to excessive use of aspirin  of resolved.  continue omeprazole  Cautious trial of meloxicam .

## 2012-10-02 NOTE — Patient Instructions (Addendum)
1) For your foot pain :  Ok to take up to 8 tramadol daily.  Adding once daily meloxicam ans an antinflammatory,  STOP THE ASPIRIN AND  Aleve  Continue the omeprazole daily to protect your stomach   Please do the take home test for stool in your blood   You are being referred to Dr byrnett for your colonoscopy.  Make sure they give you an alternative prep    Return for fasting lipids and thyroid check

## 2012-10-02 NOTE — Assessment & Plan Note (Signed)
The patient was counseled on the dangers of tobacco use, and was advised to quit and referred to a tobacco cessation program.  Reviewed strategies to maximize success, including stress management.

## 2012-10-02 NOTE — Progress Notes (Signed)
Patient ID: Pamela Romero, female   DOB: 1950-05-21, 62 y.o.   MRN: [id number]  Patient Active Problem List   Diagnosis Date Noted  . Unspecified constipation 10/02/2012  . Transient vision disturbance, bilateral 04/02/2012  . Other and unspecified hyperlipidemia 02/08/2012  . 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 Tobacco abuse counseling 04/29/2011  . Tobacco abuse 04/29/2011  . Hypothyroidism 04/29/2011  . Hypertension goal BP (blood pressure) < 130/80 04/29/2011  . Esophageal reflux 04/29/2011  . Osteoarthritis   . Cancer     Subjective:  CC:   Chief Complaint  Patient presents with  . Acute Visit    pain in right foot . Concerned about pancreatic cancer screening.    HPI:   Pamela Romero a 62 y.o. female who presents with multiple somatic complaints.  Right foot has history of OA,  And pain was previously controlled with tramadol but her foot is now now hurting by end of shift, can't bear weight on it. Wants to increase the tramadol to 8 tramadol daily .  Does not want to see podiatry   Constipated.  No prior colonoscopy due to intolerance for prep causing nausea and vomiting.    Brother died of pancreatic CA last month , metastatic at time of diagnostic workup for abdominal pain.  Her former husband also died of pancreatic ca.  She wants to be screened for pancreatic CA.  She has had no abdominal pain or weight loss and no history of alcohol abuse.   Tobacco abuse:  Has signed up for tobacco cessation counselling/coaching .  Her  first phone callis scheduled  today at work . Home stressors are making it difficult  ,  She did not smoke around her dying  Brother, but has gone back to smoking daily a less quantitysince he passed last month   Weight gain  6 lbs.  Not exercising at all.. Works 2 12 hour days and 3 8 hr days.  Mom Pamela Romero  has had a recent fall resulting in a vertebral fracture and lives with other brother. But she is caring for  her.    Past Medical History  Diagnosis Date  . Hypertension   . Hyperlipidemia   . Reflux gastritis   . Vaginal delivery     x 2  . CAD (coronary artery disease)     non critical, by cardiac cath 2006  . Abnormal EKG   . Endometrial adenocarcinoma 2011  . Thyroid disease   . Osteoarthritis   . Cancer     endometrial Ca  . tobacco abuse     Past Surgical History  Procedure Laterality Date  . Thyroidectomy  2005    partial at [facility], non malignant tumor  . Cardiac catheterization  2006    40% LAD, EF 60%  . Total abdominal hysterectomy      with BSO  . Breast surgery Right March 2014    benign Byrnett    The following portions of the patient's history were reviewed and updated as appropriate: Allergies, current medications, and problem list.    Review of Systems:   Patient denies headache, fevers, malaise, unintentional weight loss, skin rash, eye pain, sinus congestion and sinus pain, sore throat, dysphagia,  hemoptysis , cough, dyspnea, wheezing, chest pain, palpitations, orthopnea, edema, abdominal pain, nausea, melena, diarrhea, constipation, flank pain, dysuria, hematuria, urinary  Frequency, nocturia, numbness, tingling, seizures,  Focal weakness, Loss of consciousness,  Tremor, insomnia, depression, anxiety, and suicidal ideation.  History   Social History  . Marital Status: Married    Spouse Name: N/A    Number of Children: 0  . Years of Education: N/A   Occupational History  . Scientist, physiological- full time   . [employer]- part time    Social History Main Topics  . Smoking status: Current Every Day Smoker  . Smokeless tobacco: Not on file     Comment: 2-3 cigs a day  . Alcohol Use: Yes     Comment: Occasional  . Drug Use: Not on file  . Sexually Active: Not on file   Other Topics Concern  . Not on file   Social History Narrative   Lives with spouse, takes care of her mother.   Had 2 children, one died at birth, the other given up for adoption (was  unmarried).    Objective:  BP 144/98  Pulse 83  Temp(Src) 97.9 F (36.6 C) (Oral)  Resp 16  Wt 212 lb 8 oz (96.389 kg)  BMI 32.32 kg/m2  SpO2 98%  General appearance: alert, cooperative and appears stated age Ears: normal TM's and external ear canals both ears Throat: lips, mucosa, and tongue normal; teeth and gums normal Neck: no adenopathy, no carotid bruit, supple, symmetrical, trachea midline and thyroid not enlarged, symmetric, no tenderness/mass/nodules Back: symmetric, no curvature. ROM normal. No CVA tenderness. Lungs: clear to auscultation bilaterally Heart: regular rate and rhythm, S1, S2 normal, no murmur, click, rub or gallop Abdomen: soft, non-tender; bowel sounds normal; no masses,  no organomegaly Pulses: 2+ and symmetric Skin: Skin color, texture, turgor normal. No rashes or lesions Lymph nodes: Cervical, supraclavicular, and axillary nodes normal.  Assessment and Plan:  Other and unspecified hyperlipidemia High per patient,  By recent check.    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  continue omeprazole  Cautious trial of meloxicam .   Tobacco abuse counseling The patient was counseled on the dangers of tobacco use, and was advised to quit and referred to a tobacco cessation program.  Reviewed strategies to maximize success, including stress management.  Osteoarthritis Increasing tramadol to 8 daily.   Unspecified constipation Referral for colonoscopy.     A total of 40 minutes was spent with patient more than half of which was spent in counseling, reviewing records from other prviders and coordination of care.  Updated Medication List Outpatient Encounter Prescriptions as of 10/02/2012  Medication Sig Dispense Refill  . levothyroxine (SYNTHROID, LEVOTHROID) 200 MCG tablet Take 1 tablet (200 mcg total) by mouth daily. To be taken with the 25 mcg tablet in  the morning 40 minutes prior to other meds and food  90 tablet  1  . levothyroxine (SYNTHROID, LEVOTHROID) 50 MCG tablet Take 1.5 tablets (75 mcg total) by mouth daily. Take with the 200 mcg dose  30 tablet  3  . traMADol (ULTRAM) 50 MG tablet Take 2 tablets (100 mg total) by mouth every 6 (six) hours as needed for pain. .  240 tablet  3  . [DISCONTINUED] traMADol (ULTRAM) 50 MG tablet TAKE 1 TABLET (50 MG TOTAL) BY MOUTH EVERY 6 (SIX) HOURS AS NEEDED FOR PAIN.  120 tablet  3  . docusate sodium (COLACE) 100 MG capsule Take 1 capsule (100 mg total) by mouth 2 (two) times daily.  60 capsule  3  . HYDROcodone-acetaminophen (NORCO) 7.5-325 MG per tablet Take 1 tablet by mouth daily as needed for  pain.  30 tablet  0  . meloxicam (MOBIC) 15 MG tablet Take 1 tablet (15 mg total) by mouth daily.  30 tablet  3  . omeprazole (PRILOSEC) 40 MG capsule Take 1 capsule (40 mg total) by mouth daily.  30 capsule  3  . simvastatin (ZOCOR) 20 MG tablet Take one by mouth daily       No facility-administered encounter medications on file as of 10/02/2012.     Orders Placed This Encounter  Procedures  . HM MAMMOGRAPHY  . Comprehensive metabolic panel  . CBC with Differential  . TSH  . Lipid panel  . Ambulatory referral to General Surgery  . POC Hemoccult Bld/Stl (3-Cd Home Screen)    No Follow-up on file.

## 2012-10-02 NOTE — Assessment & Plan Note (Signed)
High per patient,  By recent check.

## 2012-10-02 NOTE — Assessment & Plan Note (Signed)
Needs repeat TSH 

## 2012-10-03 ENCOUNTER — Other Ambulatory Visit: Payer: Self-pay | Admitting: Internal Medicine

## 2012-10-03 NOTE — Telephone Encounter (Signed)
The patient had to increase her medication and she is almost out . Can you call into the pharmacy a refill.  traMADol (ULTRAM) 50 MG tablet

## 2012-10-04 ENCOUNTER — Other Ambulatory Visit: Payer: BC Managed Care – PPO

## 2012-10-05 ENCOUNTER — Other Ambulatory Visit: Payer: BC Managed Care – PPO

## 2012-10-05 NOTE — Telephone Encounter (Signed)
May I refill ? 

## 2012-10-08 MED ORDER — TRAMADOL HCL 50 MG PO TABS: 100.0000 mg | ORAL_TABLET | Freq: Four times a day (QID) | ORAL | Status: DC | PRN

## 2012-10-11 ENCOUNTER — Other Ambulatory Visit: Payer: Self-pay | Admitting: Internal Medicine

## 2012-10-11 NOTE — Telephone Encounter (Signed)
Okay to refill? 

## 2012-10-15 ENCOUNTER — Other Ambulatory Visit (INDEPENDENT_AMBULATORY_CARE_PROVIDER_SITE_OTHER): Payer: BC Managed Care – PPO

## 2012-10-15 DIAGNOSIS — Z79899 Other long term (current) drug therapy: Secondary | ICD-10-CM

## 2012-10-15 DIAGNOSIS — E785 Hyperlipidemia, unspecified: Secondary | ICD-10-CM

## 2012-10-15 DIAGNOSIS — K2990 Gastroduodenitis, unspecified, without bleeding: Secondary | ICD-10-CM

## 2012-10-15 DIAGNOSIS — K2970 Gastritis, unspecified, without bleeding: Secondary | ICD-10-CM

## 2012-10-15 DIAGNOSIS — E039 Hypothyroidism, unspecified: Secondary | ICD-10-CM

## 2012-10-15 LAB — LIPID PANEL
Cholesterol: 274 mg/dL — ABNORMAL HIGH (ref 0–200)
Total CHOL/HDL Ratio: 5
Triglycerides: 129 mg/dL (ref 0.0–149.0)
VLDL: 25.8 mg/dL (ref 0.0–40.0)

## 2012-10-15 LAB — CBC WITH DIFFERENTIAL/PLATELET
Basophils Relative: 0.8 % (ref 0.0–3.0)
Eosinophils Absolute: 0.1 10*3/uL (ref 0.0–0.7)
Hemoglobin: 13.2 g/dL (ref 12.0–15.0)
Lymphs Abs: 1.6 10*3/uL (ref 0.7–4.0)
MCHC: 34 g/dL (ref 30.0–36.0)
MCV: 97.7 fl (ref 78.0–100.0)
Monocytes Absolute: 0.4 10*3/uL (ref 0.1–1.0)
Neutro Abs: 3.2 10*3/uL (ref 1.4–7.7)
RBC: 3.98 Mil/uL (ref 3.87–5.11)

## 2012-10-15 LAB — COMPREHENSIVE METABOLIC PANEL
ALT: 17 U/L (ref 0–35)
AST: 22 U/L (ref 0–37)
Albumin: 3.8 g/dL (ref 3.5–5.2)
Alkaline Phosphatase: 75 U/L (ref 39–117)
BUN: 15 mg/dL (ref 6–23)
Creatinine, Ser: 1 mg/dL (ref 0.4–1.2)
Potassium: 3.8 mEq/L (ref 3.5–5.1)

## 2012-10-15 LAB — LDL CHOLESTEROL, DIRECT: Direct LDL: 195.4 mg/dL

## 2012-10-17 MED ORDER — LEVOTHYROXINE SODIUM 200 MCG PO TABS: 200.0000 ug | ORAL_TABLET | Freq: Every day | ORAL | Status: DC

## 2012-10-17 NOTE — Addendum Note (Signed)
Addended by: Sherlene Shams on: 10/17/2012 01:52 PM   Modules accepted: Orders, Medications

## 2012-10-19 ENCOUNTER — Encounter: Payer: Self-pay | Admitting: Emergency Medicine

## 2012-10-30 ENCOUNTER — Ambulatory Visit: Payer: Self-pay | Admitting: General Surgery

## 2012-11-07 ENCOUNTER — Ambulatory Visit: Payer: Self-pay | Admitting: General Surgery

## 2012-12-03 ENCOUNTER — Other Ambulatory Visit: Payer: Self-pay | Admitting: Internal Medicine

## 2012-12-03 NOTE — Telephone Encounter (Signed)
Okay to refill? 

## 2012-12-04 ENCOUNTER — Other Ambulatory Visit: Payer: Self-pay | Admitting: Internal Medicine

## 2012-12-04 NOTE — Telephone Encounter (Signed)
Okay to refill? 

## 2012-12-06 NOTE — Telephone Encounter (Signed)
Patient notified script ready and will have to pick up from pharmacy.

## 2012-12-15 ENCOUNTER — Other Ambulatory Visit: Payer: Self-pay | Admitting: Internal Medicine

## 2012-12-18 ENCOUNTER — Telehealth: Payer: Self-pay | Admitting: Internal Medicine

## 2012-12-31 ENCOUNTER — Encounter: Payer: Self-pay | Admitting: Internal Medicine

## 2012-12-31 ENCOUNTER — Ambulatory Visit: Payer: Self-pay | Admitting: General Surgery

## 2013-01-24 ENCOUNTER — Encounter: Payer: Self-pay | Admitting: *Deleted

## 2013-02-18 ENCOUNTER — Other Ambulatory Visit: Payer: Self-pay | Admitting: Internal Medicine

## 2013-02-18 NOTE — Telephone Encounter (Signed)
Last visit 10/02/12

## 2013-05-20 ENCOUNTER — Other Ambulatory Visit: Payer: Self-pay | Admitting: Internal Medicine

## 2013-05-20 NOTE — Telephone Encounter (Signed)
Okay to refill? 

## 2013-05-20 NOTE — Telephone Encounter (Signed)
Ok to refill,  printed rx  

## 2013-05-20 NOTE — Telephone Encounter (Signed)
Refill faxed

## 2013-06-17 ENCOUNTER — Other Ambulatory Visit: Payer: Self-pay | Admitting: Internal Medicine

## 2013-06-17 NOTE — Telephone Encounter (Signed)
Okay to refill? 

## 2013-08-30 ENCOUNTER — Other Ambulatory Visit: Payer: Self-pay | Admitting: Internal Medicine

## 2013-08-30 NOTE — Telephone Encounter (Signed)
Ok to refill for 30 days as long as she long as she makes appt

## 2013-08-30 NOTE — Telephone Encounter (Signed)
Okay to refill? Last seen in June 2014

## 2013-09-03 NOTE — Telephone Encounter (Signed)
Pt notified. Scheduled appt for 09/16/13, Rx faxed to pharmacy

## 2013-09-16 ENCOUNTER — Encounter: Payer: Self-pay | Admitting: Internal Medicine

## 2013-09-16 ENCOUNTER — Ambulatory Visit (INDEPENDENT_AMBULATORY_CARE_PROVIDER_SITE_OTHER): Payer: BC Managed Care – PPO | Admitting: Internal Medicine

## 2013-09-16 VITALS — BP 124/88 | HR 85 | Resp 14 | Ht 65.5 in | Wt 212.2 lb

## 2013-09-16 DIAGNOSIS — I10 Essential (primary) hypertension: Secondary | ICD-10-CM

## 2013-09-16 DIAGNOSIS — Z1239 Encounter for other screening for malignant neoplasm of breast: Secondary | ICD-10-CM

## 2013-09-16 DIAGNOSIS — E785 Hyperlipidemia, unspecified: Secondary | ICD-10-CM

## 2013-09-16 DIAGNOSIS — R5383 Other fatigue: Secondary | ICD-10-CM

## 2013-09-16 DIAGNOSIS — Z7189 Other specified counseling: Secondary | ICD-10-CM

## 2013-09-16 DIAGNOSIS — R5381 Other malaise: Secondary | ICD-10-CM

## 2013-09-16 DIAGNOSIS — E039 Hypothyroidism, unspecified: Secondary | ICD-10-CM

## 2013-09-16 DIAGNOSIS — Z79899 Other long term (current) drug therapy: Secondary | ICD-10-CM

## 2013-09-16 DIAGNOSIS — R059 Cough, unspecified: Secondary | ICD-10-CM

## 2013-09-16 DIAGNOSIS — R05 Cough: Secondary | ICD-10-CM

## 2013-09-16 DIAGNOSIS — F17200 Nicotine dependence, unspecified, uncomplicated: Secondary | ICD-10-CM

## 2013-09-16 DIAGNOSIS — R1310 Dysphagia, unspecified: Secondary | ICD-10-CM

## 2013-09-16 DIAGNOSIS — Z716 Tobacco abuse counseling: Secondary | ICD-10-CM

## 2013-09-16 LAB — COMPREHENSIVE METABOLIC PANEL
ALT: 16 U/L (ref 0–35)
AST: 23 U/L (ref 0–37)
Albumin: 3.5 g/dL (ref 3.5–5.2)
Alkaline Phosphatase: 95 U/L (ref 39–117)
BILIRUBIN TOTAL: 0.2 mg/dL (ref 0.2–1.2)
BUN: 11 mg/dL (ref 6–23)
CO2: 29 mEq/L (ref 19–32)
CREATININE: 0.8 mg/dL (ref 0.4–1.2)
Calcium: 8.8 mg/dL (ref 8.4–10.5)
Chloride: 105 mEq/L (ref 96–112)
GFR: 76.4 mL/min (ref >60.00)
Glucose, Bld: 79 mg/dL (ref 70–99)
POTASSIUM: 3.6 meq/L (ref 3.5–5.1)
Sodium: 139 mEq/L (ref 135–145)
Total Protein: 6.1 g/dL (ref 6.0–8.3)

## 2013-09-16 LAB — CBC WITH DIFFERENTIAL/PLATELET
Basophils Absolute: 0 10*3/uL (ref 0.0–0.1)
Basophils Relative: 0.6 % (ref 0.0–3.0)
Eosinophils Absolute: 0.1 10*3/uL (ref 0.0–0.7)
Eosinophils Relative: 1.8 % (ref 0.0–5.0)
HCT: 36.3 % (ref 36.0–46.0)
HEMOGLOBIN: 12.1 g/dL (ref 12.0–15.0)
LYMPHS ABS: 1 10*3/uL (ref 0.7–4.0)
LYMPHS PCT: 21.5 % (ref 12.0–46.0)
MCHC: 33.4 g/dL (ref 30.0–36.0)
MCV: 92.9 fl (ref 78.0–100.0)
MONO ABS: 0.4 10*3/uL (ref 0.1–1.0)
MONOS PCT: 9.1 % (ref 3.0–12.0)
NEUTROS PCT: 67 % (ref 43.0–77.0)
Neutro Abs: 3.1 10*3/uL (ref 1.4–7.7)
Platelets: 127 10*3/uL — ABNORMAL LOW (ref 150.0–400.0)
RBC: 3.91 Mil/uL (ref 3.87–5.11)
RDW: 13.2 % (ref 11.5–15.5)
WBC: 4.6 10*3/uL (ref 4.0–10.5)

## 2013-09-16 LAB — LIPID PANEL
CHOL/HDL RATIO: 5
Cholesterol: 201 mg/dL — ABNORMAL HIGH (ref 0–200)
HDL: 41.9 mg/dL (ref >39.00)
LDL CALC: 143 mg/dL — AB (ref 0–99)
NonHDL: 159.1
TRIGLYCERIDES: 80 mg/dL (ref 0.0–149.0)
VLDL: 16 mg/dL (ref 0.0–40.0)

## 2013-09-16 LAB — TSH: TSH: 1.45 u[IU]/mL (ref 0.35–4.50)

## 2013-09-16 MED ORDER — VARENICLINE TARTRATE 0.5 MG X 11 & 1 MG X 42 PO MISC: ORAL | Status: DC

## 2013-09-16 MED ORDER — OMEPRAZOLE 40 MG PO CPDR: 40.0000 mg | DELAYED_RELEASE_CAPSULE | Freq: Every day | ORAL | Status: DC

## 2013-09-16 NOTE — Assessment & Plan Note (Signed)
Likely multifactorial including chronic bronchitis/COPD, and GERD.  Will increase GERD therapy.  Consider CT chest non contrast

## 2013-09-16 NOTE — Progress Notes (Signed)
Patient ID: Pamela Romero, female   DOB: 1950-09-27, 62 y.o.   MRN: [id number]  Patient Active Problem List   Diagnosis Date Noted  . Cough 09/16/2013  . Unspecified constipation 10/02/2012  . Transient vision disturbance, bilateral 04/02/2012  . Other and unspecified hyperlipidemia 02/08/2012  . 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 Tobacco abuse counseling 04/29/2011  . Tobacco abuse 04/29/2011  . Hypothyroidism 04/29/2011  . Hypertension goal BP (blood pressure) < 130/80 04/29/2011  . Esophageal reflux 04/29/2011  . Osteoarthritis   . Cancer     Subjective:  CC:   Chief Complaint  Patient presents with  . Follow-up    HPI:   Pamela Romero is a 63 y.o. female who presents for Follow up on chronic conditions including hypothyroidism, NSAID induced gastritis. OA, and tobacco abuse.  Last seen a year ago. Thyroid was very underactive then due to medicaiton noncompliance. She has been having a persistent cough and is worried about her health.  She is ready to quit smoking and is requesting  Pharmacotherapy.      Dysphagia for solid goods,  Regurgitating food,  Reflux is aggravating her cough.   Has not been taking nexium,  Due to $$$   Xantac. otc using   underactive thyroid TSH was 55 last July.  Lost to follow up  Has been taking 200 mcg daily , not 250 mcg daily for the last 6 weeks,  But has been missing  doses.  Interested in taking it weekly instead    Past Medical History  Diagnosis Date  . Hypertension   . Hyperlipidemia   . Reflux gastritis   . Vaginal delivery     x 2  . CAD (coronary artery disease)     non critical, by cardiac cath 2006  . Abnormal EKG   . Endometrial adenocarcinoma 2011  . Thyroid disease   . Osteoarthritis   . Cancer     endometrial Ca  . tobacco abuse     Past Surgical History  Procedure Laterality Date  . Thyroidectomy  2005    partial at [facility], non malignant tumor  . Cardiac catheterization   2006    40% LAD, EF 60%  . Total abdominal hysterectomy      with BSO  . Breast surgery Right March 2014    benign Byrnett       The following portions of the patient's history were reviewed and updated as appropriate: Allergies, current medications, and problem list.    Review of Systems:   Patient denies headache, fevers, malaise, unintentional weight loss, skin rash, eye pain, sinus congestion and sinus pain, sore throat, dysphagia,  hemoptysis , cough, dyspnea, wheezing, chest pain, palpitations, orthopnea, edema, abdominal pain, nausea, melena, diarrhea, constipation, flank pain, dysuria, hematuria, urinary  Frequency, nocturia, numbness, tingling, seizures,  Focal weakness, Loss of consciousness,  Tremor, insomnia, depression, anxiety, and suicidal ideation.     History   Social History  . Marital Status: Married    Spouse Name: N/A    Number of Children: 0  . Years of Education: N/A   Occupational History  . Public affairs consultant- full time   . [employer]- part time    Social History Main Topics  . Smoking status: Current Every Day Smoker  . Smokeless tobacco: Not on file     Comment: 2-3 cigs a day  . Alcohol Use: Yes     Comment: Occasional  . Drug Use: Not on file  .  Sexual Activity: Not on file   Other Topics Concern  . Not on file   Social History Narrative   Lives with spouse, takes care of her mother.   Had 2 children, one died at birth, the other given up for adoption (was unmarried).    Objective:  Filed Vitals:   09/16/13 1024  BP: 124/88  Pulse: 85  Resp: 14     General appearance: alert, cooperative and appears stated age Ears: normal TM's and external ear canals both ears Throat: lips, mucosa, and tongue normal; teeth and gums normal Neck: no adenopathy, no carotid bruit, supple, symmetrical, trachea midline and thyroid not enlarged, symmetric, no tenderness/mass/nodules Back: symmetric, no curvature. ROM normal. No CVA tenderness. Lungs:  clear to auscultation bilaterally Heart: regular rate and rhythm, S1, S2 normal, no murmur, click, rub or gallop Abdomen: soft, non-tender; bowel sounds normal; no masses,  no organomegaly Pulses: 2+ and symmetric Skin: Skin color, texture, turgor normal. No rashes or lesions Lymph nodes: Cervical, supraclavicular, and axillary nodes normal.  Assessment and Plan:  Tobacco abuse counseling She is requesting help with pharmacotherapy,  Discussed risks and benefits of Chantix and wellbutrin .  Chantix rx given .    Hypothyroidism Historically Uncontrolled secondary to medication noncompliance.  Thyroid function is WNL on current dose of 200 mcg daily .  No current changes needed.    Lab Results  Component Value Date   TSH 1.45 09/16/2013     Hypertension goal BP (blood pressure) < 130/80 Currently on no medications.  Diastolic elevation noted today.  Reviewed list of meds, patient is not taking OTC meds that could be causing,. It.  Have asked patient to recheck bp at home a minimum of 5 times over the next 4 weeks and call readings to office for adjustment of medications.   Lab Results  Component Value Date   CREATININE 0.8 09/16/2013   Lab Results  Component Value Date   NA 139 09/16/2013   K 3.6 09/16/2013   CL 105 09/16/2013   CO2 29 09/16/2013     Other and unspecified hyperlipidemia  Currently tolerating  statin therapy.   Liver enzymes are normal , no changes today.  Lab Results  Component Value Date   CHOL 201* 09/16/2013   HDL 41.90 09/16/2013   LDLCALC 143* 09/16/2013   LDLDIRECT 195.4 10/15/2012   TRIG 80.0 09/16/2013   CHOLHDL 5 09/16/2013   Lab Results  Component Value Date   ALT 16 09/16/2013   AST 23 09/16/2013   ALKPHOS 95 09/16/2013   BILITOT 0.2 09/16/2013     Cough Likely multifactorial including chronic bronchitis/COPD, and GERD.  Will increase GERD therapy.  Consider CT chest non contrast    Updated Medication List Outpatient Encounter Prescriptions as of 09/16/2013   Medication Sig  . docusate sodium (COLACE) 100 MG capsule Take 1 capsule (100 mg total) by mouth 2 (two) times daily.  [name] esomeprazole (NEXIUM) 40 MG capsule Take 40 mg by mouth daily.  [name] levothyroxine (SYNTHROID, LEVOTHROID) 200 MCG tablet 1 tablet daily  . traMADol (ULTRAM) 50 MG tablet Take 2 tablets (100 mg total) by mouth every 6 (six) hours as needed for pain. .  . [DISCONTINUED] levothyroxine (SYNTHROID, LEVOTHROID) 200 MCG tablet TAKE 1 TAB EVERY DAY TO BE TAKEN W/THE 50MCG TAB IN THE MORNING, 40 MINS PRIOR TO MEDS & FOOD  . omeprazole (PRILOSEC) 40 MG capsule Take 1 capsule (40 mg total) by mouth daily.  . simvastatin (ZOCOR) 20  MG tablet Take one by mouth daily  . varenicline (CHANTIX STARTING MONTH PAK) 0.5 MG X 11 & 1 MG X 42 tablet Take one 0.5 mg tablet by mouth once daily for 3 days, then increase to one 0.5 mg tablet twice daily for 4 days, then increase to one 1 mg tablet twice daily.  . [DISCONTINUED] HYDROcodone-acetaminophen (NORCO) 7.5-325 MG per tablet Take 1 tablet by mouth daily as needed for pain.  . [DISCONTINUED] meloxicam (MOBIC) 15 MG tablet Take 1 tablet (15 mg total) by mouth daily.  . [DISCONTINUED] omeprazole (PRILOSEC) 40 MG capsule Take 1 capsule (40 mg total) by mouth daily.  . [DISCONTINUED] traMADol (ULTRAM) 50 MG tablet TAKE 1 TABLET (50 MG TOTAL) BY MOUTH EVERY 6 (SIX) HOURS AS NEEDED FOR PAIN.  . [DISCONTINUED] traMADol (ULTRAM) 50 MG tablet TAKE 1 TABLET (50 MG TOTAL) BY MOUTH EVERY 6 (SIX) HOURS AS NEEDED FOR PAIN.  . [DISCONTINUED] traMADol (ULTRAM) 50 MG tablet TAKE 1 TABLET BY MOUTH EVERY 6 HOURS AS NEEDED FOR PAIN  . [DISCONTINUED] traMADol (ULTRAM) 50 MG tablet TAKE 1 TABLET BY MOUTH EVERY 6 HOURS AS NEEDED FOR PAIN  . [DISCONTINUED] traMADol (ULTRAM) 50 MG tablet TAKE 1 TABLET BY MOUTH EVERY 6 HOURS AS NEEDED FOR PAIN  . [DISCONTINUED] traMADol (ULTRAM) 50 MG tablet TAKE 1 TABLET BY MOUTH EVERY 6 HOURS AS NEEDED     Orders Placed This Encounter   Procedures  . DG Esophagus  . MM DIGITAL SCREENING BILATERAL  . Lipid panel  . TSH  . CBC with Differential  . Comprehensive metabolic panel    Return in about 4 weeks (around 10/14/2013).

## 2013-09-16 NOTE — Assessment & Plan Note (Signed)
esophageal stricture  Suspected givnen her history of GERD, but CA also a consideration given history of tobacco abuse.

## 2013-09-16 NOTE — Assessment & Plan Note (Addendum)
Currently on no medications.  Diastolic elevation noted today.  Reviewed list of meds, patient is not taking OTC meds that could be causing,. It.  Have asked patient to recheck bp at home a minimum of 5 times over the next 4 weeks and call readings to office for adjustment of medications.   Lab Results  Component Value Date   CREATININE 0.8 09/16/2013   Lab Results  Component Value Date   NA 139 09/16/2013   K 3.6 09/16/2013   CL 105 09/16/2013   CO2 29 09/16/2013

## 2013-09-16 NOTE — Assessment & Plan Note (Signed)
Historically Uncontrolled secondary to medication noncompliance.  Thyroid function is WNL on current dose of 200 mcg daily .  No current changes needed.    Lab Results  Component Value Date   TSH 1.45 09/16/2013

## 2013-09-16 NOTE — Patient Instructions (Addendum)
Barium swallow to evaluate swallowing issues Omeprazole 40 mg daily in the morning for refulx,  continue zantac in the evening  Chantix to help you qui smoking    Varenicline oral tablets What is this medicine? VARENICLINE (var EN i kleen) is used to help people quit smoking. It can reduce the symptoms caused by stopping smoking. It is used with a patient support program recommended by your physician. This medicine may be used for other purposes; ask your health care provider or pharmacist if you have questions. COMMON BRAND NAME(S): Chantix What should I tell my health care provider before I take this medicine? They need to know if you have any of these conditions: -bipolar disorder, depression, schizophrenia or other mental illness -heart disease -if you often drink alcohol -kidney disease -peripheral vascular disease -seizures -stroke -suicidal thoughts, plans, or attempt; a previous suicide attempt by you or a family member -an unusual or allergic reaction to varenicline, other medicines, foods, dyes, or preservatives -pregnant or trying to get pregnant -breast-feeding How should I use this medicine? You should set a date to stop smoking and tell your doctor. Start this medicine one week before the quit date. You can also start taking this medicine before you choose a quit date, and then pick a quit date that is between 8 and 35 days of treatment with this medicine. Stick to your plan; ask about support groups or other ways to help you remain a 'quitter'. Take this medicine by mouth after eating. Take with a full glass of water. Follow the directions on the prescription label. Take your doses at regular intervals. Do not take your medicine more often than directed. A special MedGuide will be given to you by the pharmacist with each prescription and refill. Be sure to read this information carefully each time. Talk to your pediatrician regarding the use of this medicine in children.  This medicine is not approved for use in children. Overdosage: If you think you have taken too much of this medicine contact a poison control center or emergency room at once. NOTE: This medicine is only for you. Do not share this medicine with others. What if I miss a dose? If you miss a dose, take it as soon as you can. If it is almost time for your next dose, take only that dose. Do not take double or extra doses. What may interact with this medicine? -alcohol or any product that contains alcohol -insulin -other stop smoking aids -theophylline -warfarin This list may not describe all possible interactions. Give your health care provider a list of all the medicines, herbs, non-prescription drugs, or dietary supplements you use. Also tell them if you smoke, drink alcohol, or use illegal drugs. Some items may interact with your medicine. What should I watch for while using this medicine? Visit your doctor or health care professional for regular check ups. Ask for ongoing advice and encouragement from your doctor or healthcare professional, friends, and family to help you quit. If you smoke while on this medication, quit again Your mouth may get dry. Chewing sugarless gum or sucking hard candy, and drinking plenty of water may help. Contact your doctor if the problem does not go away or is severe. You may get drowsy or dizzy. Do not drive, use machinery, or do anything that needs mental alertness until you know how this medicine affects you. Do not stand or sit up quickly, especially if you are an older patient. This reduces the risk of dizzy or  fainting spells. The use of this medicine may increase the chance of suicidal thoughts or actions. Pay special attention to how you are responding while on this medicine. Any worsening of mood, or thoughts of suicide or dying should be reported to your health care professional right away. What side effects may I notice from receiving this medicine? Side  effects that you should report to your doctor or health care professional as soon as possible: -allergic reactions like skin rash, itching or hives, swelling of the face, lips, tongue, or throat -breathing problems -changes in vision -chest pain or chest tightness -confusion, trouble speaking or understanding -fast, irregular heartbeat -feeling faint or lightheaded, falls -fever -pain in legs when walking -problems with balance, talking, walking -redness, blistering, peeling or loosening of the skin, including inside the mouth -ringing in ears -seizures -sudden numbness or weakness of the face, arm or leg -suicidal thoughts or other mood changes -trouble passing urine or change in the amount of urine -unusual bleeding or bruising -unusually weak or tired Side effects that usually do not require medical attention (report to your doctor or health care professional if they continue or are bothersome): -constipation -headache -nausea, vomiting -strange dreams -stomach gas -trouble sleeping This list may not describe all possible side effects. Call your doctor for medical advice about side effects. You may report side effects to FDA at 1-800-FDA-1088. Where should I keep my medicine? Keep out of the reach of children. Store at room temperature between 15 and 30 degrees C (59 and 86 degrees F). Throw away any unused medicine after the expiration date. NOTE: This sheet is a summary. It may not cover all possible information. If you have questions about this medicine, talk to your doctor, pharmacist, or health care provider.  2014, Elsevier/Gold Standard. (2013-01-07 13:37:47)

## 2013-09-16 NOTE — Assessment & Plan Note (Addendum)
She is requesting help with pharmacotherapy,  Discussed risks and benefits of Chantix and wellbutrin .  Chantix rx given .

## 2013-09-16 NOTE — Assessment & Plan Note (Signed)
Currently tolerating  statin therapy.   Liver enzymes are normal , no changes today.  Lab Results  Component Value Date   CHOL 201* 09/16/2013   HDL 41.90 09/16/2013   LDLCALC 143* 09/16/2013   LDLDIRECT 195.4 10/15/2012   TRIG 80.0 09/16/2013   CHOLHDL 5 09/16/2013   Lab Results  Component Value Date   ALT 16 09/16/2013   AST 23 09/16/2013   ALKPHOS 95 09/16/2013   BILITOT 0.2 09/16/2013

## 2013-09-16 NOTE — Progress Notes (Signed)
Pre visit review using our clinic review tool, if applicable. No additional management support is needed unless otherwise documented below in the visit note. 

## 2013-09-19 ENCOUNTER — Encounter: Payer: Self-pay | Admitting: *Deleted

## 2013-10-16 ENCOUNTER — Telehealth: Payer: Self-pay | Admitting: *Deleted

## 2013-10-16 MED ORDER — TRAMADOL HCL 50 MG PO TABS: 100.0000 mg | ORAL_TABLET | Freq: Four times a day (QID) | ORAL | Status: DC | PRN

## 2013-10-16 NOTE — Telephone Encounter (Signed)
Ok to refill,  printed rx .tt

## 2013-10-16 NOTE — Telephone Encounter (Signed)
Fax from pharmacy requesting Tramadol HCL 50mg  tablet one tablet PO every 6 hours PRN for pain.  Please advise refill

## 2013-10-17 NOTE — Telephone Encounter (Signed)
Rx faxed

## 2013-11-07 ENCOUNTER — Encounter: Payer: BC Managed Care – PPO | Admitting: Internal Medicine

## 2013-12-18 ENCOUNTER — Encounter: Payer: Self-pay | Admitting: Internal Medicine

## 2013-12-18 ENCOUNTER — Ambulatory Visit (INDEPENDENT_AMBULATORY_CARE_PROVIDER_SITE_OTHER): Payer: BC Managed Care – PPO | Admitting: Internal Medicine

## 2013-12-18 VITALS — BP 122/78 | HR 78 | Temp 97.7°F | Resp 14 | Ht 67.0 in | Wt 210.2 lb

## 2013-12-18 DIAGNOSIS — M2140 Flat foot [pes planus] (acquired), unspecified foot: Secondary | ICD-10-CM

## 2013-12-18 DIAGNOSIS — E785 Hyperlipidemia, unspecified: Secondary | ICD-10-CM

## 2013-12-18 DIAGNOSIS — E663 Overweight: Secondary | ICD-10-CM | POA: Insufficient documentation

## 2013-12-18 DIAGNOSIS — E034 Atrophy of thyroid (acquired): Secondary | ICD-10-CM

## 2013-12-18 DIAGNOSIS — E038 Other specified hypothyroidism: Secondary | ICD-10-CM

## 2013-12-18 DIAGNOSIS — Z9889 Other specified postprocedural states: Secondary | ICD-10-CM | POA: Insufficient documentation

## 2013-12-18 DIAGNOSIS — M2142 Flat foot [pes planus] (acquired), left foot: Secondary | ICD-10-CM | POA: Insufficient documentation

## 2013-12-18 DIAGNOSIS — E0789 Other specified disorders of thyroid: Secondary | ICD-10-CM

## 2013-12-18 DIAGNOSIS — E669 Obesity, unspecified: Secondary | ICD-10-CM | POA: Insufficient documentation

## 2013-12-18 DIAGNOSIS — F17200 Nicotine dependence, unspecified, uncomplicated: Secondary | ICD-10-CM

## 2013-12-18 DIAGNOSIS — Z79899 Other long term (current) drug therapy: Secondary | ICD-10-CM

## 2013-12-18 DIAGNOSIS — Z0000 Encounter for general adult medical examination without abnormal findings: Secondary | ICD-10-CM | POA: Insufficient documentation

## 2013-12-18 DIAGNOSIS — Z6826 Body mass index (BMI) 26.0-26.9, adult: Secondary | ICD-10-CM | POA: Insufficient documentation

## 2013-12-18 DIAGNOSIS — C549 Malignant neoplasm of corpus uteri, unspecified: Secondary | ICD-10-CM

## 2013-12-18 DIAGNOSIS — E6609 Other obesity due to excess calories: Secondary | ICD-10-CM | POA: Insufficient documentation

## 2013-12-18 DIAGNOSIS — M2141 Flat foot [pes planus] (acquired), right foot: Secondary | ICD-10-CM | POA: Insufficient documentation

## 2013-12-18 DIAGNOSIS — Z1159 Encounter for screening for other viral diseases: Secondary | ICD-10-CM

## 2013-12-18 DIAGNOSIS — R1310 Dysphagia, unspecified: Secondary | ICD-10-CM

## 2013-12-18 DIAGNOSIS — Z720 Tobacco use: Secondary | ICD-10-CM

## 2013-12-18 DIAGNOSIS — C541 Malignant neoplasm of endometrium: Secondary | ICD-10-CM

## 2013-12-18 DIAGNOSIS — Z1239 Encounter for other screening for malignant neoplasm of breast: Secondary | ICD-10-CM

## 2013-12-18 LAB — COMPREHENSIVE METABOLIC PANEL
ALK PHOS: 100 U/L (ref 39–117)
ALT: 11 U/L (ref 0–35)
AST: 16 U/L (ref 0–37)
Albumin: 3.7 g/dL (ref 3.5–5.2)
BILIRUBIN TOTAL: 0.2 mg/dL (ref 0.2–1.2)
BUN: 11 mg/dL (ref 6–23)
CO2: 27 mEq/L (ref 19–32)
Calcium: 8.8 mg/dL (ref 8.4–10.5)
Chloride: 102 mEq/L (ref 96–112)
Creatinine, Ser: 1 mg/dL (ref 0.4–1.2)
GFR: 62 mL/min (ref >60.00)
Glucose, Bld: 66 mg/dL — ABNORMAL LOW (ref 70–99)
Potassium: 3.3 mEq/L — ABNORMAL LOW (ref 3.5–5.1)
SODIUM: 138 meq/L (ref 135–145)
Total Protein: 7 g/dL (ref 6.0–8.3)

## 2013-12-18 LAB — LIPID PANEL
CHOLESTEROL: 233 mg/dL — AB (ref 0–200)
HDL: 37.3 mg/dL — AB (ref >39.00)
LDL Cholesterol: 160 mg/dL — ABNORMAL HIGH (ref 0–99)
NonHDL: 195.7
Total CHOL/HDL Ratio: 6
Triglycerides: 179 mg/dL — ABNORMAL HIGH (ref 0.0–149.0)
VLDL: 35.8 mg/dL (ref 0.0–40.0)

## 2013-12-18 NOTE — Assessment & Plan Note (Signed)
Last Thyroid function was WNL on current dose.  No current changes needed, but patient admits to recent noncompliance due to forgetting to take meds regualrly.  Discussed weekly dosing on Sundays

## 2013-12-18 NOTE — Assessment & Plan Note (Signed)
Annual mammogram ordered.

## 2013-12-18 NOTE — Assessment & Plan Note (Signed)
She continues to see Dr. Jacquelyne Balint for management of endometrial cancer diagnosed in 2011.

## 2013-12-18 NOTE — Progress Notes (Addendum)
Patient ID: Pamela Romero, female   DOB: 1950-11-20, 63 y.o.   MRN: [id number]    Subjective:     Pamela Romero is a 63 y.o. female and is here for a comprehensive physical exam. The patient reports multiple issues:.  Annual exam and follow up on lipids, thyroid, dysphagia  At last check  Her TSH was finally therapeutic ,  But she  Admits to medical noncompliance due to forgetting to take meds daily At last visit we discussed once weekly dosing due to medicaion noncompliance  Lipids: at last check in  June , LDL was 215  Never started the simvastatin   Eats a lot of restaurant/ fast food  In small frequent amounts .  Drinks 3 or 4 sodas daily   Tobacco abuse:  Still smoking, did not try the medication i gave her.  But has reduced from one pack  Daily to 10 cigs daily using a program at work  Has a coaching line .    AnxietyL Mother still drinking heavily at 30 and in an out of the hospital monthly.    Stressed out by her decline and her husband's unemployment after being laid off  And wt gain of 100 lbs   Still having dysphagia for liquids and solids.  better if she eats slowly.   Last EGstrD was negative for stricture.   Sees Pamela Romero annually for history of enodmetrial CA     History   Social History  . Marital Status: Married    Spouse Name: N/A    Number of Children: 0  . Years of Education: N/A   Occupational History  . Public affairs consultant- full time   . [employer]- part time    Social History Main Topics  . Smoking status: Current Every Day Smoker  . Smokeless tobacco: Not on file     Comment: 2-3 cigs a day  . Alcohol Use: Yes     Comment: Occasional  . Drug Use: Not on file  . Sexual Activity: Not on file   Other Topics Concern  . Not on file   Social History Narrative   Lives with spouse, takes care of her mother.   Had 2 children, one died at birth, the other given up for adoption (was unmarried).   Health Maintenance  Topic Date Due  . Pap Smear   09/21/1970  . Colonoscopy  09/21/2002  . Zostavax  09/20/2012  . Influenza Vaccine  11/09/2013  . Mammogram  04/10/2014  . Tetanus/tdap  02/06/2022    The following portions of the patient's history were reviewed and updated as appropriate: allergies, current medications, past family history, past medical history, past social history, past surgical history and problem list.  Review of Systems A comprehensive review of systems was negative.   Objective:    BP 122/78  Pulse 78  Temp(Src) 97.7 F (36.5 C) (Oral)  Resp 14  Ht 5\' 7"  (1.702 m)  Wt 210 lb 4 oz (95.369 kg)  BMI 32.92 kg/m2  SpO2 98%  General appearance: alert, cooperative and appears stated age Head: Normocephalic, without obvious abnormality, atraumatic Eyes: conjunctivae/corneas clear. PERRL, EOM's intact. Fundi benign. Ears: normal TM's and external ear canals both ears Nose: Nares normal. Septum midline. Mucosa normal. No drainage or sinus tenderness. Throat: lips, mucosa, and tongue normal; teeth and gums normal Neck: no adenopathy, no carotid bruit, no JVD, supple, symmetrical, trachea midline and thyroid not enlarged, symmetric, no tenderness/mass/nodules Lungs: clear to auscultation bilaterally Breasts:  normal appearance, no masses or tenderness Heart: regular rate and rhythm, S1, S2 normal, no murmur, click, rub or gallop Abdomen: soft, non-tender; bowel sounds normal; no masses,  no organomegaly Extremities: extremities normal, atraumatic, no cyanosis or edema Pulses: 2+ and symmetric Skin: Skin color, texture, turgor normal. No rashes or lesions Neurologic: Alert and oriented X 3, normal strength and tone. Normal symmetric reflexes. Normal coordination and gait.   .    Assessment and plan:    Dysphagia, unspecified(787.20) Most recent EGD was negative for stricture or mass.  Symptoms are prevented by eating slowly, suggesting achalasia.   Hypothyroidism Last Thyroid function was WNL on current  dose.  No current changes needed, but patient admits to recent noncompliance due to forgetting to take meds regualrly.  Discussed weekly dosing on Sundays   Tobacco abuse She has reduced daily use using an employer sponsored support program, currently 10 cigs daily .  Discussed alternative early morning actions.   Other and unspecified hyperlipidemia She has been  noncomnpliant with regular use of simvastatin.  Changing  simvastatin  To atorvastatin for more LDL reduction (untreated 215,  Treated 160) Lab Results  Component Value Date   CHOL 233* 12/18/2013   HDL 37.30* 12/18/2013   LDLCALC 160* 12/18/2013   LDLDIRECT 195.4 10/15/2012   TRIG 179.0* 12/18/2013   CHOLHDL 6 12/18/2013   Lab Results  Component Value Date   ALT 11 12/18/2013   AST 16 12/18/2013   ALKPHOS 100 12/18/2013   BILITOT 0.2 12/18/2013     Obesity, unspecified I have addressed  BMI and recommended wt loss of 10% of body weight over the next 6 months using a low glycemic index diet and regular exercise a minimum of 5 days per week.    Endometrial cancer She continues to see Pamela Romero for management of endometrial cancer diagnosed in 2011.   S/P breast biopsy Annual mammogram ordered.   Annual physical exam Annual comprehensive exam was done including breast, excluding pelvic,  as well as a comprehensive physical exam and management of acute and chronic conditions .  During the course of the visit the patient was educated and counseled about appropriate screening and preventive services including : fall prevention , diabetes screening, nutrition counseling, colorectal cancer screening, and recommended immunizations.  Printed recommendations for health maintenance screenings was given.   Pes planus of both feet She has chronici foot pain aggravated by pes planus and lack of appropriate arch support in shoes,  Podiatry referral advised .     Updated Medication List Outpatient Encounter Prescriptions as of 12/18/2013   Medication Sig  . levothyroxine (SYNTHROID, LEVOTHROID) 200 MCG tablet 1 tablet daily  . traMADol (ULTRAM) 50 MG tablet Take 2 tablets (100 mg total) by mouth every 6 (six) hours as needed. .  . [DISCONTINUED] simvastatin (ZOCOR) 20 MG tablet Take one by mouth daily  . atorvastatin (LIPITOR) 20 MG tablet Take 1 tablet (20 mg total) by mouth daily.  [name] docusate sodium (COLACE) 100 MG capsule Take 100 mg by mouth 2 (two) times daily as needed.  . [DISCONTINUED] docusate sodium (COLACE) 100 MG capsule Take 1 capsule (100 mg total) by mouth 2 (two) times daily.  . [DISCONTINUED] esomeprazole (NEXIUM) 40 MG capsule Take 40 mg by mouth daily.  . [DISCONTINUED] omeprazole (PRILOSEC) 40 MG capsule Take 1 capsule (40 mg total) by mouth daily.  . [DISCONTINUED] varenicline (CHANTIX STARTING MONTH PAK) 0.5 MG X 11 & 1 MG X 42 tablet Take  one 0.5 mg tablet by mouth once daily for 3 days, then increase to one 0.5 mg tablet twice daily for 4 days, then increase to one 1 mg tablet twice daily.

## 2013-12-18 NOTE — Assessment & Plan Note (Signed)
Annual comprehensive exam was done including breast, excluding pelvic,  as well as a comprehensive physical exam and management of acute and chronic conditions .  During the course of the visit the patient was educated and counseled about appropriate screening and preventive services including : fall prevention , diabetes screening, nutrition counseling, colorectal cancer screening, and recommended immunizations.  Printed recommendations for health maintenance screenings was given.

## 2013-12-18 NOTE — Progress Notes (Signed)
Pre visit review using our clinic review tool, if applicable. No additional management support is needed unless otherwise documented below in the visit note. 

## 2013-12-18 NOTE — Assessment & Plan Note (Addendum)
She has been  noncomnpliant with regular use of simvastatin.  Changing  simvastatin  To atorvastatin for more LDL reduction (untreated 215,  Treated 160) Lab Results  Component Value Date   CHOL 233* 12/18/2013   HDL 37.30* 12/18/2013   LDLCALC 160* 12/18/2013   LDLDIRECT 195.4 10/15/2012   TRIG 179.0* 12/18/2013   CHOLHDL 6 12/18/2013   Lab Results  Component Value Date   ALT 11 12/18/2013   AST 16 12/18/2013   ALKPHOS 100 12/18/2013   BILITOT 0.2 12/18/2013

## 2013-12-18 NOTE — Assessment & Plan Note (Signed)
I have addressed  BMI and recommended wt loss of 10% of body weight over the next 6 months using a low glycemic index diet and regular exercise a minimum of 5 days per week.   

## 2013-12-18 NOTE — Assessment & Plan Note (Signed)
Most recent EGD was negative for stricture or mass.  Symptoms are prevented by eating slowly, suggesting achalasia.    

## 2013-12-18 NOTE — Patient Instructions (Signed)
You had your annual  wellness exam today.  Please return to Dr Sabra Heck for your follow up on endometrial cancer    We will schedule your mammogram at [facility] soon .   You are due for the Shingles vaccine but not the pneumonia vaccine.  Check with your insurance to see if they cover it.    You can take your thyroid medication once a week on Sundays,  All 7 daily doses,    Referral to Dr Vickki Muff the foot doctor in process  I will resume your cholesterol medication if your LDL is still > 130 today

## 2013-12-18 NOTE — Assessment & Plan Note (Signed)
She has reduced daily use using an employer sponsored support program, currently 10 cigs daily .  Discussed alternative early morning actions.

## 2013-12-18 NOTE — Assessment & Plan Note (Signed)
She has chronici foot pain aggravated by pes planus and lack of appropriate arch support in shoes,  Podiatry referral advised .

## 2013-12-19 LAB — HEPATITIS C ANTIBODY: HCV Ab: NEGATIVE

## 2013-12-19 MED ORDER — ATORVASTATIN CALCIUM 20 MG PO TABS: 20.0000 mg | ORAL_TABLET | Freq: Every day | ORAL | Status: DC

## 2013-12-19 NOTE — Addendum Note (Signed)
Addended by: Crecencio Mc on: 12/19/2013 07:09 AM   Modules accepted: Orders, Medications

## 2013-12-26 ENCOUNTER — Ambulatory Visit: Payer: Self-pay | Admitting: Podiatry

## 2014-01-02 ENCOUNTER — Ambulatory Visit (INDEPENDENT_AMBULATORY_CARE_PROVIDER_SITE_OTHER): Payer: BC Managed Care – PPO

## 2014-01-02 ENCOUNTER — Encounter: Payer: Self-pay | Admitting: Podiatry

## 2014-01-02 ENCOUNTER — Ambulatory Visit (INDEPENDENT_AMBULATORY_CARE_PROVIDER_SITE_OTHER): Payer: BC Managed Care – PPO | Admitting: Podiatry

## 2014-01-02 VITALS — BP 125/85 | HR 83 | Resp 16 | Ht 68.0 in | Wt 210.0 lb

## 2014-01-02 DIAGNOSIS — M19079 Primary osteoarthritis, unspecified ankle and foot: Secondary | ICD-10-CM

## 2014-01-02 DIAGNOSIS — M722 Plantar fascial fibromatosis: Secondary | ICD-10-CM

## 2014-01-02 DIAGNOSIS — M19071 Primary osteoarthritis, right ankle and foot: Secondary | ICD-10-CM

## 2014-01-02 DIAGNOSIS — Q6650 Congenital pes planus, unspecified foot: Secondary | ICD-10-CM

## 2014-01-02 DIAGNOSIS — M779 Enthesopathy, unspecified: Secondary | ICD-10-CM

## 2014-01-02 NOTE — Patient Instructions (Signed)
Cryotherapy °Cryotherapy means treatment with cold. Ice or gel packs can be used to reduce both pain and swelling. Ice is the most helpful within the first 24 to 48 hours after an injury or flare-up from overusing a muscle or joint. Sprains, strains, spasms, burning pain, shooting pain, and aches can all be eased with ice. Ice can also be used when recovering from surgery. Ice is effective, has very few side effects, and is safe for most people to use. °PRECAUTIONS  °Ice is not a safe treatment option for people with: °· Raynaud phenomenon. This is a condition affecting small blood vessels in the extremities. Exposure to cold may cause your problems to return. °· Cold hypersensitivity. There are many forms of cold hypersensitivity, including: °¨ Cold urticaria. Red, itchy hives appear on the skin when the tissues begin to warm after being iced. °¨ Cold erythema. This is a red, itchy rash caused by exposure to cold. °¨ Cold hemoglobinuria. Red blood cells break down when the tissues begin to warm after being iced. The hemoglobin that carry oxygen are passed into the urine because they cannot combine with blood proteins fast enough. °· Numbness or altered sensitivity in the area being iced. °If you have any of the following conditions, do not use ice until you have discussed cryotherapy with your caregiver: °· Heart conditions, such as arrhythmia, angina, or chronic heart disease. °· High blood pressure. °· Healing wounds or open skin in the area being iced. °· Current infections. °· Rheumatoid arthritis. °· Poor circulation. °· Diabetes. °Ice slows the blood flow in the region it is applied. This is beneficial when trying to stop inflamed tissues from spreading irritating chemicals to surrounding tissues. However, if you expose your skin to cold temperatures for too long or without the proper protection, you can damage your skin or nerves. Watch for signs of skin damage due to cold. °HOME CARE INSTRUCTIONS °Follow  these tips to use ice and cold packs safely. °· Place a dry or damp towel between the ice and skin. A damp towel will cool the skin more quickly, so you may need to shorten the time that the ice is used. °· For a more rapid response, add gentle compression to the ice. °· Ice for no more than 10 to 20 minutes at a time. The bonier the area you are icing, the less time it will take to get the benefits of ice. °· Check your skin after 5 minutes to make sure there are no signs of a poor response to cold or skin damage. °· Rest 20 minutes or more between uses. °· Once your skin is numb, you can end your treatment. You can test numbness by very lightly touching your skin. The touch should be so light that you do not see the skin dimple from the pressure of your fingertip. When using ice, most people will feel these normal sensations in this order: cold, burning, aching, and numbness. °· Do not use ice on someone who cannot communicate their responses to pain, such as small children or people with dementia. °HOW TO MAKE AN ICE PACK °Ice packs are the most common way to use ice therapy. Other methods include ice massage, ice baths, and cryosprays. Muscle creams that cause a cold, tingly feeling do not offer the same benefits that ice offers and should not be used as a substitute unless recommended by your caregiver. °To make an ice pack, do one of the following: °· Place crushed ice or a   bag of frozen vegetables in a sealable plastic bag. Squeeze out the excess air. Place this bag inside another plastic bag. Slide the bag into a pillowcase or place a damp towel between your skin and the bag. °· Mix 3 parts water with 1 part rubbing alcohol. Freeze the mixture in a sealable plastic bag. When you remove the mixture from the freezer, it will be slushy. Squeeze out the excess air. Place this bag inside another plastic bag. Slide the bag into a pillowcase or place a damp towel between your skin and the bag. °SEEK MEDICAL CARE  IF: °· You develop white spots on your skin. This may give the skin a blotchy (mottled) appearance. °· Your skin turns blue or pale. °· Your skin becomes waxy or hard. °· Your swelling gets worse. °MAKE SURE YOU:  °· Understand these instructions. °· Will watch your condition. °· Will get help right away if you are not doing well or get worse. °Document Released: 11/22/2010 Document Revised: 08/12/2013 Document Reviewed: 11/22/2010 °Exit[employer]® Patient Information ©2015 [employer]. This information is not intended to replace advice given to you by your health care provider. Make sure you discuss any questions you have with your health care provider. ° °

## 2014-01-02 NOTE — Progress Notes (Signed)
   Subjective:    Patient ID: Pamela Romero, female    DOB: 04/20/1950, 63 y.o.   MRN: [id number]  HPI Comments: Pamela Romero, 63 year old female, presents the office today with complaints of right foot/ankle pain, left foot pain. She states that her bilateral heels are painful in the morning or after periods of work. She states she gets some relief with ambulation and pain recurs after seeing her feet for long periods. Also states that she has right ankle pain which she believes is worse than that heel pain. The ankle pain has been ongoing for approximately 6 years it is worse in the morning or after periods of rest. She has been taking tramadol and arthritis BC powder for pain. She's been soaking her feet Epson salts and using some arthritis cream to her feet. She is also tried numerous orthotics without relief of symptoms. She continues to have discomfort. Denies any history, or injury to the area. No other complaints at this time.  Foot Pain Associated symptoms include headaches, nausea and vomiting.      Review of Systems  HENT:       Ringing in ears  Cardiovascular:       Calf pain  Gastrointestinal: Positive for nausea and vomiting.  Musculoskeletal:       Joint pain Difficulty walking Muscle pain   Neurological: Positive for headaches.  Hematological: Bruises/bleeds easily.  All other systems reviewed and are negative.      Objective:   Physical Exam AAO x3, NAD DP/PT pulses palpable b/l. CRT < 3sec Protective sensation intact with Derrel Nip monofilament, vibratory sensation intact, Achilles tendon reflex intact. Tenderness to palpation over the lateral aspect of the rear foot over the subtalar joint on the right. Limitation in range of motion in both inversion and eversion. Mild crepitation with range of motion. Ankle joint range of motion without discomfort/crepitus. Severe decrease in medial arch height upon weightbearing. No tenderness on the course of posterior  tibial tendon. Tenderness to palpation over the plantar medial aspect of the bilateral calcaneus at the insertion of the plantar fascia. No pain within the course of the plantar fascia and the arch of the foot and no pain with lateral compression or along the posterior aspect.  MMT 5/5; equinus bilaterally No open lesions. No calf pain, swelling, warmth.     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 -X-rays were obtained and reviewed with the patient. See x-ray report for full details. No acute fracture. -Conservative versus surgical treatment were discussed including alternatives, risks, complications. -At this time the majority the pain is localized on the right foot over the subtalar joint. Patient states that this is more painful than the heel pain. Discussed injection therapy. At this time the patient wishes to proceed with an injection into the right subtalar joint. Under sterile skin preparation a total of 2.5 cc of dexamethasone, 0.5% Marcaine plain, 2% lidocaine plain was infiltrated in the subtalar joint. Patient tolerated the injection well without complications. Discussed that as both a diagnostic and therapeutic injection. -At next appointment will consider injections into the bilateral heels. -Discussed -Ice to the effected area. -Discussed stretching exercises. -Discussed possible [place] brace the right side. -Followup in 2 weeks or sooner if any problems are to arise or any changes symptoms. In the meantime call any questions, concerns, change in symptoms.

## 2014-01-16 ENCOUNTER — Ambulatory Visit (INDEPENDENT_AMBULATORY_CARE_PROVIDER_SITE_OTHER): Payer: BC Managed Care – PPO | Admitting: Podiatry

## 2014-01-16 DIAGNOSIS — M722 Plantar fascial fibromatosis: Secondary | ICD-10-CM

## 2014-01-16 DIAGNOSIS — M19071 Primary osteoarthritis, right ankle and foot: Secondary | ICD-10-CM

## 2014-01-16 DIAGNOSIS — Q6650 Congenital pes planus, unspecified foot: Secondary | ICD-10-CM

## 2014-01-16 MED ORDER — TRIAMCINOLONE ACETONIDE 10 MG/ML IJ SUSP
10.0000 mg | Freq: Once | INTRAMUSCULAR | Status: AC
  Administered 2014-01-16: 10 mg

## 2014-01-16 NOTE — Progress Notes (Signed)
Patient ID: Pamela Romero, female   DOB: 1951/02/18, 63 y.o.   MRN: [id number]  Subjective: [name] returns the office they for followup evaluation of bilateral foot pain. She states that after receiving injection into her right foot last appointment she had relief of symptoms in the area. Does state that she had continued symptoms however the pain was decreased. She continues to have pain in the bilateral plantar aspect of her heel. No acute changes since last appointment and no recent or new injury. No other complaints at this time.  Objective: AAO x3, NAD DP/PT pulse palpable b/l. CRT < 3 sec Protective sensation intact with Simms Weinstein monofilament, vibratory sensation intact, Achilles tendon reflex intact. Significant decrease in medial arch height upon weightbearing with a right greater than left. Tenderness over the sinus tarsi. Pain with range of motion crepitus with range of motion of the subtalar joint. Diffuse mild tenderness in the right foot over the ankle, subtalar joint as well as the plantar aspect of the foot. Tenderness to bilateral plantar medial tubercle of the calcaneus at the insertion of the plantar fascia. No pain with lateral compression of the calcaneus. No pain on the posterior aspect of the calcaneus. Equinus bilaterally. No open lesions. No calf pain, swelling, warmth.  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 Bilateral plantar fasciitis.  Plan: -Various she options discussed including alternatives, risks, complications. -Patient states that she has some relief of symptoms after the previous injection. At that time she continues to have pain to her bilateral heels. Patient elects to proceed with steroid injection into the bilateral heels. Under sterile skin preparation, a total of 2.5cc of kenalog 10, 0.5% Marcaine plain, and 2% lidocaine plain were infiltrated into the symptomatic  area without complication. A band-aid was applied. Patient tolerated the injection well without complication.  -We can discuss long-term treatment options for her given her significant flatfoot arthritic changes. I don't expect complete relief of symptoms at this plantar fascial injections but we are using these to help identify the symptomatic areas and to help relieve some of the inflammation and pain. -Discussed for likely cast for an [place] brace and the right lower extremity next appointment. -Continue ice the effected area. -Followup in 2 weeks or sooner if any problems are to arise or any changes symptoms. In the meantime call the office with any questions, concerns, change in symptoms.

## 2014-01-16 NOTE — Patient Instructions (Signed)
Cryotherapy °Cryotherapy means treatment with cold. Ice or gel packs can be used to reduce both pain and swelling. Ice is the most helpful within the first 24 to 48 hours after an injury or flare-up from overusing a muscle or joint. Sprains, strains, spasms, burning pain, shooting pain, and aches can all be eased with ice. Ice can also be used when recovering from surgery. Ice is effective, has very few side effects, and is safe for most people to use. °PRECAUTIONS  °Ice is not a safe treatment option for people with: °· Raynaud phenomenon. This is a condition affecting small blood vessels in the extremities. Exposure to cold may cause your problems to return. °· Cold hypersensitivity. There are many forms of cold hypersensitivity, including: °¨ Cold urticaria. Red, itchy hives appear on the skin when the tissues begin to warm after being iced. °¨ Cold erythema. This is a red, itchy rash caused by exposure to cold. °¨ Cold hemoglobinuria. Red blood cells break down when the tissues begin to warm after being iced. The hemoglobin that carry oxygen are passed into the urine because they cannot combine with blood proteins fast enough. °· Numbness or altered sensitivity in the area being iced. °If you have any of the following conditions, do not use ice until you have discussed cryotherapy with your caregiver: °· Heart conditions, such as arrhythmia, angina, or chronic heart disease. °· High blood pressure. °· Healing wounds or open skin in the area being iced. °· Current infections. °· Rheumatoid arthritis. °· Poor circulation. °· Diabetes. °Ice slows the blood flow in the region it is applied. This is beneficial when trying to stop inflamed tissues from spreading irritating chemicals to surrounding tissues. However, if you expose your skin to cold temperatures for too long or without the proper protection, you can damage your skin or nerves. Watch for signs of skin damage due to cold. °HOME CARE INSTRUCTIONS °Follow  these tips to use ice and cold packs safely. °· Place a dry or damp towel between the ice and skin. A damp towel will cool the skin more quickly, so you may need to shorten the time that the ice is used. °· For a more rapid response, add gentle compression to the ice. °· Ice for no more than 10 to 20 minutes at a time. The bonier the area you are icing, the less time it will take to get the benefits of ice. °· Check your skin after 5 minutes to make sure there are no signs of a poor response to cold or skin damage. °· Rest 20 minutes or more between uses. °· Once your skin is numb, you can end your treatment. You can test numbness by very lightly touching your skin. The touch should be so light that you do not see the skin dimple from the pressure of your fingertip. When using ice, most people will feel these normal sensations in this order: cold, burning, aching, and numbness. °· Do not use ice on someone who cannot communicate their responses to pain, such as small children or people with dementia. °HOW TO MAKE AN ICE PACK °Ice packs are the most common way to use ice therapy. Other methods include ice massage, ice baths, and cryosprays. Muscle creams that cause a cold, tingly feeling do not offer the same benefits that ice offers and should not be used as a substitute unless recommended by your caregiver. °To make an ice pack, do one of the following: °· Place crushed ice or a   bag of frozen vegetables in a sealable plastic bag. Squeeze out the excess air. Place this bag inside another plastic bag. Slide the bag into a pillowcase or place a damp towel between your skin and the bag. °· Mix 3 parts water with 1 part rubbing alcohol. Freeze the mixture in a sealable plastic bag. When you remove the mixture from the freezer, it will be slushy. Squeeze out the excess air. Place this bag inside another plastic bag. Slide the bag into a pillowcase or place a damp towel between your skin and the bag. °SEEK MEDICAL CARE  IF: °· You develop white spots on your skin. This may give the skin a blotchy (mottled) appearance. °· Your skin turns blue or pale. °· Your skin becomes waxy or hard. °· Your swelling gets worse. °MAKE SURE YOU:  °· Understand these instructions. °· Will watch your condition. °· Will get help right away if you are not doing well or get worse. °Document Released: 11/22/2010 Document Revised: 08/12/2013 Document Reviewed: 11/22/2010 °Exit[employer]® Patient Information ©2015 [employer]. This information is not intended to replace advice given to you by your health care provider. Make sure you discuss any questions you have with your health care provider. ° °

## 2014-01-30 ENCOUNTER — Ambulatory Visit (INDEPENDENT_AMBULATORY_CARE_PROVIDER_SITE_OTHER): Payer: BC Managed Care – PPO | Admitting: Podiatry

## 2014-01-30 DIAGNOSIS — M722 Plantar fascial fibromatosis: Secondary | ICD-10-CM

## 2014-01-30 DIAGNOSIS — M21961 Unspecified acquired deformity of right lower leg: Secondary | ICD-10-CM

## 2014-01-30 DIAGNOSIS — M2142 Flat foot [pes planus] (acquired), left foot: Secondary | ICD-10-CM

## 2014-01-30 DIAGNOSIS — M19071 Primary osteoarthritis, right ankle and foot: Secondary | ICD-10-CM

## 2014-01-30 DIAGNOSIS — M2141 Flat foot [pes planus] (acquired), right foot: Secondary | ICD-10-CM

## 2014-01-30 NOTE — Progress Notes (Signed)
Patient ID: Pamela Romero, female   DOB: 15-Jan-1951, 63 y.o.   MRN: [id number]  Subjective: Pamela Romero returns the office they for followup evaluation for right foot and ankle pain as well as bilateral plantar fasciitis. She states that since the injections last appointment she did not have much relief of symptoms. She also states that she's had recurrence of pain on the outside aspect of her foot over the site of the prior injection. She denies any acute changes since last appointment. No other complaints at this time.  Objective: AAO x3, NAD DP/PT pulses palpable bilaterally, CRT less than 3 seconds Protective sensation intact with Simms Weinstein monofilament, vibratory sensation intact Significant decrease in medial arch height bilaterally with tenderness over the right sinus tarsi. Decreased range of motion of subtalar joint with crepitus. Mild discomfort with range of motion of the ankle joint. Tenderness directly over the sinus tarsi. Mild tenderness to the plantar medial aspect of the calcaneus at the insertion the plantar fascia. No pain with lateral compression of the calcaneus or along the posterior aspect. Equinus bilaterally. MMT 4/5.  No calf pain, swelling, warmth No open lesions.  Assessment: 63 year old female with bilateral pes planovalgus right greater than left with symptomatic subtalar joint arthritis  Plan: -Conservative versus surgical treatment were again discussed including alternatives, risks, complications. -At this time recommend [place] brace. Patient unable to be casted today. She will come to the office tomorrow morning to be casted. -Followup in 3 weeks and likely steroid injection to the right sinus tarsi. -Followup sooner if any questions, concerns, change in symptoms.

## 2014-01-31 ENCOUNTER — Ambulatory Visit (INDEPENDENT_AMBULATORY_CARE_PROVIDER_SITE_OTHER): Payer: BC Managed Care – PPO | Admitting: *Deleted

## 2014-01-31 DIAGNOSIS — M21961 Unspecified acquired deformity of right lower leg: Secondary | ICD-10-CM

## 2014-01-31 DIAGNOSIS — M2142 Flat foot [pes planus] (acquired), left foot: Secondary | ICD-10-CM

## 2014-01-31 DIAGNOSIS — M2141 Flat foot [pes planus] (acquired), right foot: Secondary | ICD-10-CM

## 2014-01-31 DIAGNOSIS — M19071 Primary osteoarthritis, right ankle and foot: Secondary | ICD-10-CM

## 2014-01-31 NOTE — Progress Notes (Signed)
Patient was casted for [place] brace

## 2014-02-14 ENCOUNTER — Telehealth: Payer: Self-pay | Admitting: *Deleted

## 2014-02-14 NOTE — Telephone Encounter (Signed)
Calling in reference to a brace we're making for the patient.  We need to verify if you want the articulated or non-articulated standard brace.  If an articulated brace what type of joint, free motion or dorsi-assist?

## 2014-02-14 NOTE — Telephone Encounter (Signed)
Articulated is fine. Thanks.

## 2014-02-16 ENCOUNTER — Emergency Department: Payer: Self-pay | Admitting: Emergency Medicine

## 2014-02-19 ENCOUNTER — Telehealth: Payer: Self-pay | Admitting: *Deleted

## 2014-02-19 NOTE — Telephone Encounter (Signed)
Spoke with carleen at [facility] letting her know that the afo is non articulated per dr Jacqualyn Posey. [phone]

## 2014-02-20 ENCOUNTER — Ambulatory Visit: Payer: BC Managed Care – PPO | Admitting: Podiatry

## 2014-02-24 ENCOUNTER — Telehealth: Payer: Self-pay | Admitting: *Deleted

## 2014-02-24 NOTE — Telephone Encounter (Signed)
Need to know what style of brace to make for this patient.  Are you guys looking for articulated or non-articulated standard brace?  If you want articulated, do you want free motion or dorsi-flex.  Please give me a call.  Thank you.

## 2014-02-26 ENCOUNTER — Telehealth: Payer: Self-pay | Admitting: *Deleted

## 2014-02-26 NOTE — Telephone Encounter (Signed)
Spoke with Pamela Romero again at [facility] [phone] because they called again on 11.16.15 wanting to know if the afo brace was articulated or non articulated. Told Pamela Romero once again per dr wagoner non articulated. Pamela Romero understood and apologized for calling again. Stated that for some reason this order keeps going in circles.

## 2014-02-27 ENCOUNTER — Ambulatory Visit (INDEPENDENT_AMBULATORY_CARE_PROVIDER_SITE_OTHER): Payer: BC Managed Care – PPO | Admitting: Podiatry

## 2014-02-27 VITALS — BP 143/94 | HR 78 | Resp 16

## 2014-02-27 DIAGNOSIS — M2141 Flat foot [pes planus] (acquired), right foot: Secondary | ICD-10-CM

## 2014-02-27 DIAGNOSIS — M19071 Primary osteoarthritis, right ankle and foot: Secondary | ICD-10-CM

## 2014-02-27 DIAGNOSIS — M21961 Unspecified acquired deformity of right lower leg: Secondary | ICD-10-CM

## 2014-02-27 DIAGNOSIS — M2142 Flat foot [pes planus] (acquired), left foot: Secondary | ICD-10-CM

## 2014-02-27 DIAGNOSIS — M79671 Pain in right foot: Secondary | ICD-10-CM

## 2014-02-27 NOTE — Progress Notes (Signed)
Patient ID: Pamela Romero, female   DOB: [birth year], 63 y.o.   MRN: [id number]  Subjective: 63 year old female returns the office today for continued pain in her right foot. She is asking for steroid injection at this time. She denies any acute changes since last appointment. Denies any systemic complaints such as fevers, chills, nausea, vomiting. No other complaints at this time.  Objective: AAO x3, NAD DP/PT pulses palpable bilaterally, CRT less than 3 seconds Protective sensation intact with Simms Weinstein monofilament, vibratory sensation intact, Achilles tendon reflex intact Significant decrease in medial arch height bilaterally with tenderness directly over the sinus tarsi on the right foot. There is mild tenderness along the dorsal medial aspect of the foot over the talo- navicular joint. There is significant decrease in range of motion of the subtalar joint with crepitation. At this time there is no discomfort with range of motion of the ankle joint bilaterally. Equinus present. There is mild discomfort on the left foot but not as to medical's the right. Range of motion decrease in right foot  No calf pain with compression, swelling, warmth, erythema.  Assessment: 63 year old female with subtalar joint osteoarthritis/sinus tarsi pain/capsulitis; symptomatic flatfoot deformity  Plan: -Conservative versus surgical options discussed including alternatives, risks, complications. -At this time the patient is requesting a steroid injection into the area on the lateral aspect of the foot over the sinus tarsi as this is her site of maximal tenderness. Risks and, occasions of the injection were discussed the patient for which she understands and verbally consents to the injection. Under sterile Betadine preparation a total of 2 mL mixture of dexamethasone 4 mg, 0.5% Marcaine plain, 2% lidocaine plain was infiltrated in the sinus tarsi. Band-aid was applied. Patient tolerated the injection well  without complications. -We have not yet received the [place]. Patient to follow up once the brace arrives.  -The patient would like to proceed with surgical intervention, however would like to hold off for at least another year in order to retire from her job. Discussed with her that we can use bracing as well as periodic injections to control her symptoms during this time. -In the meantime, call the office with any questions, concerns, change in symptoms.

## 2014-02-27 NOTE — Patient Instructions (Signed)
Continue ice to the area over the next few days to help prevent a steroid flare.

## 2014-03-20 ENCOUNTER — Telehealth: Payer: Self-pay | Admitting: Internal Medicine

## 2014-03-20 ENCOUNTER — Ambulatory Visit: Payer: BC Managed Care – PPO | Admitting: Internal Medicine

## 2014-03-20 ENCOUNTER — Other Ambulatory Visit: Payer: Self-pay | Admitting: *Deleted

## 2014-03-20 MED ORDER — LEVOTHYROXINE SODIUM 200 MCG PO TABS: ORAL_TABLET | ORAL | Status: DC

## 2014-03-20 NOTE — Telephone Encounter (Signed)
The patient is completely out of her medication  levothyroxine (SYNTHROID, LEVOTHROID) 200 MCG tablet  She would like a office visit with Dr. Derrel Nip only

## 2014-03-20 NOTE — Telephone Encounter (Signed)
Please take off schedule.

## 2014-03-20 NOTE — Telephone Encounter (Signed)
Pt called to cancel appt due to being called in at work. Please advise to cancel off schedule/msn

## 2014-04-01 ENCOUNTER — Ambulatory Visit (INDEPENDENT_AMBULATORY_CARE_PROVIDER_SITE_OTHER): Payer: BC Managed Care – PPO | Admitting: Podiatry

## 2014-04-01 ENCOUNTER — Encounter: Payer: Self-pay | Admitting: Podiatry

## 2014-04-01 VITALS — BP 137/99 | HR 87 | Resp 18

## 2014-04-01 DIAGNOSIS — M19071 Primary osteoarthritis, right ankle and foot: Secondary | ICD-10-CM

## 2014-04-01 NOTE — Patient Instructions (Signed)
Ice the area that was injected today to help prevent a steroid flare

## 2014-04-02 NOTE — Progress Notes (Signed)
Patient ID: Pamela Romero, female   DOB: 04-09-1951, 63 y.o.   MRN: [id number]  Subjective: 63 year old female presents the office today to pick up [address] and for further evaluation of the right foot and ankle pain. The patient states that she periodically continues to get intermittent pain into the foot and ankle while working. She states that the injections into the side of her foot (sinus tarsi/subtalar joint injections) have helped. She states that she started to get more pain in the actual ankle joint. She points to the anterior lateral aspect of the ankle. No other complaints at this time in no acute changes since last appointment.  Objective: AAO 3, NAD DP/PT pulses palpable bilaterally, CRT less than 3 seconds Protective sensation intact with Simms Weinstein monofilament, vibratory sensation intact, Achilles tendon reflex intact. On the right foot there continues to have significant decrease in medial arch height upon weightbearing with tenderness overlying the sinus tarsi on the right foot. There is tenderness at this time upon anterior lateral aspect of the ankle joint is mild discomfort with ankle joint range of motion and ankle joint motion is limited. There is no overlying edema, erythema, increase in warmth. Significant reduction in range of motion of the subtalar joint and there is crepitation. Patient has symptoms in the left foot as well but not as significant as the right. No pain with calf compression, swelling, warmth, erythema.  Assessment: 63 year old female with subtalar joint osteoarthritis, sinus tarsi pain; right ankle joint pain/osteoarthritis  Plan: -Treatment options were discussed with the patient including alternatives, risks, complications. -[place] brace was fitted to the patient which appears to be fitting well although she is unable to fit inside of her work shoe. She states that the heel area is to bulky. Will send the brace back to the company to see if  they can modify it to make it somewhat smaller to fit inside of her shoe to work. -As the patient is having more pain in her ankle discussed steroid injection into the ankle joint at this time for both diagnostic and therapeutic purposes. Patient agrees to the injection. Under sterile Betadine preparation, a total of 2 mL mixture of dexamethasone phosphate, 0.5% Marcaine plain and 2% lidocaine plain was infiltrated into the area of maximal tenderness on the anterior lateral aspect of the ankle joint. A Band-Aid was applied. The patient tolerated the injection well without any complications. Post injection care was discussed with the patient.  -Follow-up after the brace is adjusted. In the meantime, call the office with any questions, concerns, change in symptoms.

## 2014-04-28 ENCOUNTER — Ambulatory Visit: Payer: BC Managed Care – PPO | Admitting: Internal Medicine

## 2014-05-06 ENCOUNTER — Telehealth: Payer: Self-pay | Admitting: *Deleted

## 2014-05-06 NOTE — Telephone Encounter (Signed)
Opened in error

## 2014-05-06 NOTE — Telephone Encounter (Signed)
Called pt to let her know afo brace can not be made any thinner per [facility]. Told pt she can have the brace we made for her and she said she did not want it and was not going to wear it. Pt stated she will come by the office tomorrow to pick up rx for afo to be made at [facility] that dr Jacqualyn Posey wrote for.

## 2014-05-14 ENCOUNTER — Ambulatory Visit (INDEPENDENT_AMBULATORY_CARE_PROVIDER_SITE_OTHER): Payer: BLUE CROSS/BLUE SHIELD | Admitting: Internal Medicine

## 2014-05-14 ENCOUNTER — Encounter: Payer: Self-pay | Admitting: Internal Medicine

## 2014-05-14 ENCOUNTER — Encounter (INDEPENDENT_AMBULATORY_CARE_PROVIDER_SITE_OTHER): Payer: Self-pay

## 2014-05-14 ENCOUNTER — Telehealth: Payer: Self-pay | Admitting: Internal Medicine

## 2014-05-14 VITALS — BP 140/100 | HR 80 | Temp 98.4°F | Resp 14 | Ht 68.0 in | Wt 212.2 lb

## 2014-05-14 DIAGNOSIS — Z23 Encounter for immunization: Secondary | ICD-10-CM

## 2014-05-14 DIAGNOSIS — R1310 Dysphagia, unspecified: Secondary | ICD-10-CM

## 2014-05-14 DIAGNOSIS — E038 Other specified hypothyroidism: Secondary | ICD-10-CM

## 2014-05-14 DIAGNOSIS — M2142 Flat foot [pes planus] (acquired), left foot: Secondary | ICD-10-CM

## 2014-05-14 DIAGNOSIS — E034 Atrophy of thyroid (acquired): Secondary | ICD-10-CM

## 2014-05-14 DIAGNOSIS — E785 Hyperlipidemia, unspecified: Secondary | ICD-10-CM

## 2014-05-14 DIAGNOSIS — Z716 Tobacco abuse counseling: Secondary | ICD-10-CM

## 2014-05-14 DIAGNOSIS — E559 Vitamin D deficiency, unspecified: Secondary | ICD-10-CM

## 2014-05-14 DIAGNOSIS — M19071 Primary osteoarthritis, right ankle and foot: Secondary | ICD-10-CM

## 2014-05-14 DIAGNOSIS — I10 Essential (primary) hypertension: Secondary | ICD-10-CM

## 2014-05-14 DIAGNOSIS — M2141 Flat foot [pes planus] (acquired), right foot: Secondary | ICD-10-CM

## 2014-05-14 LAB — COMPREHENSIVE METABOLIC PANEL
ALK PHOS: 99 U/L (ref 39–117)
ALT: 12 U/L (ref 0–35)
AST: 18 U/L (ref 0–37)
Albumin: 4.1 g/dL (ref 3.5–5.2)
BUN: 12 mg/dL (ref 6–23)
CALCIUM: 9.4 mg/dL (ref 8.4–10.5)
CO2: 29 mEq/L (ref 19–32)
CREATININE: 0.99 mg/dL (ref 0.40–1.20)
Chloride: 106 mEq/L (ref 96–112)
GFR: 60.48 mL/min (ref >60.00)
GLUCOSE: 74 mg/dL (ref 70–99)
Potassium: 4.1 mEq/L (ref 3.5–5.1)
SODIUM: 142 meq/L (ref 135–145)
Total Bilirubin: 0.4 mg/dL (ref 0.2–1.2)
Total Protein: 6.7 g/dL (ref 6.0–8.3)

## 2014-05-14 LAB — TSH: TSH: 56.08 u[IU]/mL — AB (ref 0.35–4.50)

## 2014-05-14 LAB — LIPID PANEL
CHOL/HDL RATIO: 5
Cholesterol: 289 mg/dL — ABNORMAL HIGH (ref 0–200)
HDL: 53.1 mg/dL (ref >39.00)
NonHDL: 235.9
Triglycerides: 210 mg/dL — ABNORMAL HIGH (ref 0.0–149.0)
VLDL: 42 mg/dL — AB (ref 0.0–40.0)

## 2014-05-14 LAB — VITAMIN D 25 HYDROXY (VIT D DEFICIENCY, FRACTURES): VITD: 26.66 ng/mL — ABNORMAL LOW (ref 30.00–100.00)

## 2014-05-14 LAB — LDL CHOLESTEROL, DIRECT: Direct LDL: 199 mg/dL

## 2014-05-14 MED ORDER — ATORVASTATIN CALCIUM 20 MG PO TABS: 20.0000 mg | ORAL_TABLET | Freq: Every day | ORAL | Status: DC

## 2014-05-14 MED ORDER — TRAMADOL HCL 50 MG PO TABS: 100.0000 mg | ORAL_TABLET | Freq: Four times a day (QID) | ORAL | Status: DC | PRN

## 2014-05-14 MED ORDER — HYDROCHLOROTHIAZIDE 25 MG PO TABS: 25.0000 mg | ORAL_TABLET | Freq: Every day | ORAL | Status: DC

## 2014-05-14 NOTE — Telephone Encounter (Signed)
emmi mailed  °

## 2014-05-14 NOTE — Progress Notes (Signed)
Pre-visit discussion using our clinic review tool. No additional management support is needed unless otherwise documented below in the visit note.  

## 2014-05-14 NOTE — Patient Instructions (Addendum)
I am proud of you for quitting smoking!!   I am starting you on atorvastatin for your cholesterol,  And hctz for your blood pressure  Referral to Dr Debby Bud at [facility] for your ankle pain     This is  my version of a  "Low GI"  Weight loss Diet:  It will allow you to lose 4 to 8  lbs  per month if you follow it carefully.  Your goal with exercise is a minimum of 30 minutes of aerobic exercise 5 days per week (Walking does not count once it becomes easy!)     All of the foods can be found at grocery stores and in bulk at [employer].  The Atkins protein bars and shakes are available in more varieties at Target, [employer] and [place].     7 AM Breakfast:  Choose from the following:  Low carbohydrate Protein  Shakes (I recommend the EAS AdvantEdge "Carb Control" shakes, Atkins,  Muscle Milk or Premier Protein shakes  All are < 4  carbs   a scrambled egg/bacon/cheese burrito made with Mission's "carb balance" whole wheat tortilla  (about 10 net carbs )  A slice of home made fritatta (egg based dish without a crust:  google it)    Avoid cereal and bananas, oatmeal and cream of wheat and grits. They are loaded with carbohydrates!   10 AM: high protein snack  Protein bar by Atkins  Or KIND  (the snack size, under 200 cal, usually < 6 net carbs).    A stick of cheese:  Around 1 carb,  100 cal      Other so called "protein bars" and Greek yogurts tend to be loaded with carbohydrates.  Remember, in food advertising, the word "energy" is synonymous for " carbohydrate."  Lunch:   A Sandwich using the bread choices listed, Can use any  Eggs,  lunchmeat, grilled meat or canned tuna), avocado, regular mayo/mustard  and cheese.  A Salad using blue cheese, ranch,  Goddess or vinagrette,  No croutons or "confetti" and no "candied nuts" but regular nuts OK.   No pretzels or chips.  Pickles and miniature sweet peppers are a good low carb alternative that provide a "crunch"  The bread is the only source of  carbohydrate in a sandwich and  can be decreased by trying some of these alternatives to traditional loaf bread  Joseph's makes a pita bread and a flat bread that are 50 cal and 4 net carbs available at [place] and [place].  This can be toasted to use with hummous as well  Toufayan makes a low carb flatbread that's 100 cal and 9 net carbs available at [employer] and [employer]'s makes 2 sizes of  Low carb whole wheat tortilla  (The large one is 210 cal and 6 net carbs)  Flat Out makes flatbreads that are low carb as well  Avoid "Low fat dressings, as well as Barry Brunner and [place] dressings They are loaded with sugar!   3 PM/ Mid day  Snack:  Consider  1 ounce of  almonds, walnuts, pistachios, pecans, peanuts,  Macadamia nuts or a nut medley.  Avoid "granola"; the dried cranberries and raisins are loaded with carbohydrates. Mixed nuts as long as there are no raisins,  cranberries or dried fruit.    Try the prosciutto/mozzarella cheese sticks by [employer]ci  In deli /backery section   High protein   To avoid overindulging in snacks: Try drinking a  glass of unsweeted almond/coconut milk  Or a cup of coffee with your Atkins chocolate bar to keep you from having 3!!!   Pork rinds!  Yes Pork Rinds        6 PM  Dinner:     Meat/fowl/fish with a green salad, and either broccoli, cauliflower, green beans, spinach, brussel sprouts or  Lima beans. DO NOT BREAD THE PROTEIN!!      There is a low carb pasta by Dreamfield's that is acceptable and tastes great: only 5 digestible carbs/serving.( All grocery stores but BJs carry it )  Try Hurley Cisco Angelo's chicken piccata or chicken or eggplant parm over low carb pasta.(Lowes and BJs)   Marjory Lies Sanchez's "Carnitas" (pulled pork, no sauce,  0 carbs) or his beef pot roast to make a dinner burrito (at [employer]J's)  Pesto over low carb pasta (bj's sells a good quality pesto in the center refrigerated section of the deli   Try satueeing  Cheral Marker with mushroooms  Whole  wheat pasta is still full of digestible carbs and  Not as low in glycemic index as Dreamfield's.   Brown rice is still rice,  So skip the rice and noodles if you eat [place] or [place] (or at least limit to 1/2 cup)  9 PM snack :   Breyer's "low carb" fudgsicle or  ice cream bar (Carb Smart line), or  Weight Watcher's ice cream bar , or another "no sugar added" ice cream;  a serving of fresh berries/cherries with whipped cream   Cheese or DANNON'S LlGHT N FIT GREEK YOGURT or the [place]os greek yogurt   8 ounces of [place] unsweetened almond/cococunut milk  Cheese and crackers (using WASA crackers,  They are low carb) or peanut butter on low carb crackers or pita bread     Avoid bananas, pineapple, grapes  and watermelon on a regular basis because they are high in sugar.  THINK OF THEM AS DESSERT  Remember that snack Substitutions should be less than 10 NET carbs per serving and meals should be < 20 net carbs. Remember that carbohydrates from fiber do not affect blood sugar, so you can  subtract fiber grams to get the "net carbs " of any particular food item.

## 2014-05-14 NOTE — Progress Notes (Signed)
Patient ID: Pamela Romero, female   DOB: September 24, 1950, 64 y.o.   MRN: [id number]  Patient Active Problem List   Diagnosis Date Noted  . Obesity, unspecified 12/18/2013  . Annual physical exam 12/18/2013  . S/P breast biopsy 12/18/2013  . Pes planus of both feet 12/18/2013  . Cough 09/16/2013  . Dysphagia, unspecified(787.20) 09/16/2013  . Unspecified constipation 10/02/2012  . Transient vision disturbance, bilateral 04/02/2012  . Hyperlipidemia LDL goal <100 02/08/2012  . 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 Tobacco abuse counseling 04/29/2011  . Hypothyroidism 04/29/2011  . Hypertension goal BP (blood pressure) < 130/80 04/29/2011  . Esophageal reflux 04/29/2011  . Osteoarthritis   . Endometrial cancer     Subjective:  CC:   Chief Complaint  Patient presents with  . Follow-up    General for medication    HPI:   Pamela Romero is a 64 y.o. female who presents for  Follow up on chronic conditions including hypothyroidism, hypertension , hyperlipidemia, and tobacco abuse.    She has been taking her thyroid medication regularly but has not been taking any statin therapy for several months .  She denies statin intolerance.  Just did not get around to picking up the medication. She was noncompliant with simvastatin previously and was taking it about 40% of the time,  But is currently taking neither simvastatin nor atorvastatin.  She has reduced her tobacco abuse to two cigarettes daily after her employer employed financial incentives to reduce tobacco use.  Her last cigarette was at  4 am  Has been assisted by a Smoking coach made available to her through the company.   Her feet continue to be a constant source of pain despite treatment by podiatry.  She is frustrated at the amount of out of pocket expense she has incurred ($1500) and is unable to wear the brace or receive a refund for the ill fitting brace that was fashioned for her, because she  cannot wear her work shoes with the brace on.  She has daily pain which is most severe by evening.  Using tramadol for pain relief,  Has history of NSAID induced gastritis.    Past Medical History  Diagnosis Date  . Hypertension   . Hyperlipidemia   . Reflux gastritis   . Vaginal delivery     x 2  . CAD (coronary artery disease)     non critical, by cardiac cath 2006  . Abnormal EKG   . Endometrial adenocarcinoma 2011  . Thyroid disease   . Osteoarthritis   . Cancer     endometrial Ca  . tobacco abuse     Past Surgical History  Procedure Laterality Date  . Thyroidectomy  2005    partial at [facility], non malignant tumor  . Cardiac catheterization  2006    40% LAD, EF 60%  . Total abdominal hysterectomy      with BSO  . Breast surgery Right March 2014    benign [place]       The following portions of the patient's history were reviewed and updated as appropriate: Allergies, current medications, and problem list.    Review of Systems:   Patient denies headache, fevers, malaise, unintentional weight loss, skin rash, eye pain, sinus congestion and sinus pain, sore throat, dysphagia,  hemoptysis , cough, dyspnea, wheezing, chest pain, palpitations, orthopnea, edema, abdominal pain, nausea, melena, diarrhea, constipation, flank pain, dysuria, hematuria, urinary  Frequency, nocturia, numbness, tingling, seizures,  Focal weakness, Loss  of consciousness,  Tremor, insomnia, depression, anxiety, and suicidal ideation.     History   Social History  . Marital Status: Married    Spouse Name: N/A    Number of Children: 0  . Years of Education: N/A   Occupational History  . Public affairs consultant- full time   . [employer]- part time    Social History Main Topics  . Smoking status: Current Every Day Smoker  . Smokeless tobacco: Not on file     Comment: 2-3 cigs a day  . Alcohol Use: No  . Drug Use: Not on file  . Sexual Activity: Not on file   Other Topics Concern  . Not on file    Social History Narrative   Lives with spouse, takes care of her mother.   Had 2 children, one died at birth, the other given up for adoption (was unmarried).    Objective:  Filed Vitals:   05/14/14 0822  BP: 140/100  Pulse: 80  Temp: 98.4 F (36.9 C)  Resp: 14     General appearance: alert, cooperative and appears stated age Ears: normal TM's and external ear canals both ears Throat: lips, mucosa, and tongue normal; teeth and gums normal Neck: no adenopathy, no carotid bruit, supple, symmetrical, trachea midline and thyroid not enlarged, symmetric, no tenderness/mass/nodules Back: symmetric, no curvature. ROM normal. No CVA tenderness. Lungs: clear to auscultation bilaterally Heart: regular rate and rhythm, S1, S2 normal, no murmur, click, rub or gallop Abdomen: soft, non-tender; bowel sounds normal; no masses,  no organomegaly Pulses: 2+ and symmetric Skin: Skin color, texture, turgor normal. No rashes or lesions Lymph nodes: Cervical, supraclavicular, and axillary nodes normal.  Assessment and Plan:  Problem List Items Addressed This Visit    Dysphagia    Most recent EGD was negative for stricture or mass.  Symptoms are prevented by eating slowly, suggesting achalasia.         Hyperlipidemia LDL goal <100    She has been  noncompliant with regular use of simvastatin and did not start the atorvastatin after last visit.,  Her untreated LDL is 160 and her 10 yr risk of CAD is > 28% due to concurrent HTN and tobacco abuse.  Strongly urged to start atorvastatin.  Lab Results  Component Value Date   CHOL 289* 05/14/2014   HDL 53.10 05/14/2014   LDLCALC 160* 12/18/2013   LDLDIRECT 199.0 05/14/2014   TRIG 210.0* 05/14/2014   CHOLHDL 5 05/14/2014   Lab Results  Component Value Date   ALT 12 05/14/2014   AST 18 05/14/2014   ALKPHOS 99 05/14/2014   BILITOT 0.4 05/14/2014           Relevant Medications   atorvastatin (LIPITOR) tablet   hydrochlorothiazide  tablet   Hypertension goal BP (blood pressure) < 130/80    Uncontrolled today.  Adding hctz.  compliance encouraged given her increased risk of CVA/CAD,      Relevant Medications   atorvastatin (LIPITOR) tablet   hydrochlorothiazide tablet   Hypothyroidism - Primary    Thyroid function is grossly underactive on  current dose of 200 mcf daily .  I suspect she has been noncompliant again given her history, but she reports compliance today.  Will repeat level is 6 weeks at same dose   Lab Results  Component Value Date   TSH 56.08* 05/14/2014         Relevant Medications   levothyroxine (SYNTHROID, LEVOTHROID) tablet   Other Relevant Orders   TSH (  Completed)   Pes planus of both feet    She continues to have severe pain daily due to multiple acquired conditions involving her feet and ankles. She is requesting a second opinion if her insurance will cover a referral to Dr Debby Bud at [place].       Tobacco abuse counseling    Smoking cessation instruction/counseling given:  commended patient for quitting and reviewed strategies for preventing relapses.  She is down to 2 cigarettes daily        Other Visit Diagnoses    Encounter for immunization        Hyperlipidemia        Relevant Medications    atorvastatin (LIPITOR) tablet    hydrochlorothiazide tablet    Other Relevant Orders    Lipid panel (Completed)    Vitamin D deficiency        Relevant Orders    Vit D  25 hydroxy (rtn osteoporosis monitoring) (Completed)    Essential hypertension        Relevant Medications    atorvastatin (LIPITOR) tablet    hydrochlorothiazide tablet    Other Relevant Orders    Comprehensive metabolic panel (Completed)    DJD (degenerative joint disease), ankle and foot, right        Relevant Medications    traMADol (ULTRAM) 50 MG tablet    Other Relevant Orders    Ambulatory referral to Orthopedic Surgery

## 2014-05-15 ENCOUNTER — Encounter: Payer: Self-pay | Admitting: Internal Medicine

## 2014-05-15 ENCOUNTER — Telehealth: Payer: Self-pay | Admitting: Internal Medicine

## 2014-05-15 MED ORDER — LEVOTHYROXINE SODIUM 200 MCG PO TABS: ORAL_TABLET | ORAL | Status: DC

## 2014-05-15 NOTE — Addendum Note (Signed)
Addended by: Crecencio Mc on: 05/15/2014 03:03 PM   Modules accepted: Orders

## 2014-05-15 NOTE — Assessment & Plan Note (Signed)
Most recent EGD was negative for stricture or mass.  Symptoms are prevented by eating slowly, suggesting achalasia.

## 2014-05-15 NOTE — Assessment & Plan Note (Addendum)
Thyroid function is grossly underactive on  current dose of 200 mcf daily .  I suspect she has been noncompliant again given her history, but she reports compliance today.  Will repeat level is 6 weeks at same dose   Lab Results  Component Value Date   TSH 56.08* 05/14/2014

## 2014-05-15 NOTE — Assessment & Plan Note (Signed)
She has been  noncompliant with regular use of simvastatin and did not start the atorvastatin after last visit.,  Her untreated LDL is 160 and her 10 yr risk of CAD is > 28% due to concurrent HTN and tobacco abuse.  Strongly urged to start atorvastatin.  Lab Results  Component Value Date   CHOL 289* 05/14/2014   HDL 53.10 05/14/2014   LDLCALC 160* 12/18/2013   LDLDIRECT 199.0 05/14/2014   TRIG 210.0* 05/14/2014   CHOLHDL 5 05/14/2014   Lab Results  Component Value Date   ALT 12 05/14/2014   AST 18 05/14/2014   ALKPHOS 99 05/14/2014   BILITOT 0.4 05/14/2014

## 2014-05-15 NOTE — Assessment & Plan Note (Signed)
She continues to have severe pain daily due to multiple acquired conditions involving her feet and ankles. She is requesting a second opinion if her insurance will cover a referral to Dr Debby Bud at [place].

## 2014-05-15 NOTE — Assessment & Plan Note (Addendum)
Uncontrolled today.  Adding hctz.  compliance encouraged given her increased risk of CVA/CAD,

## 2014-05-15 NOTE — Telephone Encounter (Signed)
emmi mailed  °

## 2014-05-15 NOTE — Assessment & Plan Note (Signed)
Smoking cessation instruction/counseling given:  commended patient for quitting and reviewed strategies for preventing relapses.  She is down to 2 cigarettes daily

## 2014-06-23 ENCOUNTER — Other Ambulatory Visit: Payer: BLUE CROSS/BLUE SHIELD

## 2014-12-10 ENCOUNTER — Other Ambulatory Visit: Payer: Self-pay | Admitting: Internal Medicine

## 2014-12-10 NOTE — Telephone Encounter (Signed)
Last OV 2.3.16.  Please advise refill 

## 2014-12-11 NOTE — Telephone Encounter (Signed)
rx faxed

## 2014-12-11 NOTE — Telephone Encounter (Signed)
Refill for 30 days only.  OFFICE VISIT NEEDED prior to any more refills 

## 2015-01-21 ENCOUNTER — Ambulatory Visit (INDEPENDENT_AMBULATORY_CARE_PROVIDER_SITE_OTHER): Payer: BLUE CROSS/BLUE SHIELD | Admitting: Internal Medicine

## 2015-01-21 ENCOUNTER — Encounter: Payer: Self-pay | Admitting: Internal Medicine

## 2015-01-21 VITALS — BP 138/96 | HR 92 | Temp 98.1°F | Resp 12 | Ht 68.0 in | Wt 207.1 lb

## 2015-01-21 DIAGNOSIS — K2970 Gastritis, unspecified, without bleeding: Secondary | ICD-10-CM

## 2015-01-21 DIAGNOSIS — R1314 Dysphagia, pharyngoesophageal phase: Secondary | ICD-10-CM

## 2015-01-21 DIAGNOSIS — E034 Atrophy of thyroid (acquired): Secondary | ICD-10-CM

## 2015-01-21 DIAGNOSIS — K2100 Gastro-esophageal reflux disease with esophagitis, without bleeding: Secondary | ICD-10-CM

## 2015-01-21 DIAGNOSIS — K2990 Gastroduodenitis, unspecified, without bleeding: Secondary | ICD-10-CM

## 2015-01-21 DIAGNOSIS — K2960 Other gastritis without bleeding: Secondary | ICD-10-CM

## 2015-01-21 DIAGNOSIS — E669 Obesity, unspecified: Secondary | ICD-10-CM | POA: Diagnosis not present

## 2015-01-21 DIAGNOSIS — I10 Essential (primary) hypertension: Secondary | ICD-10-CM

## 2015-01-21 DIAGNOSIS — T3991XA Poisoning by unspecified nonopioid analgesic, antipyretic and antirheumatic, accidental (unintentional), initial encounter: Secondary | ICD-10-CM

## 2015-01-21 DIAGNOSIS — Z720 Tobacco use: Secondary | ICD-10-CM

## 2015-01-21 DIAGNOSIS — Z8719 Personal history of other diseases of the digestive system: Secondary | ICD-10-CM

## 2015-01-21 DIAGNOSIS — K219 Gastro-esophageal reflux disease without esophagitis: Secondary | ICD-10-CM | POA: Diagnosis not present

## 2015-01-21 DIAGNOSIS — R110 Nausea: Secondary | ICD-10-CM

## 2015-01-21 DIAGNOSIS — K209 Esophagitis, unspecified without bleeding: Secondary | ICD-10-CM

## 2015-01-21 DIAGNOSIS — E785 Hyperlipidemia, unspecified: Secondary | ICD-10-CM | POA: Diagnosis not present

## 2015-01-21 DIAGNOSIS — Z1239 Encounter for other screening for malignant neoplasm of breast: Secondary | ICD-10-CM

## 2015-01-21 DIAGNOSIS — Z716 Tobacco abuse counseling: Secondary | ICD-10-CM

## 2015-01-21 DIAGNOSIS — Z113 Encounter for screening for infections with a predominantly sexual mode of transmission: Secondary | ICD-10-CM

## 2015-01-21 DIAGNOSIS — Z23 Encounter for immunization: Secondary | ICD-10-CM

## 2015-01-21 DIAGNOSIS — E038 Other specified hypothyroidism: Secondary | ICD-10-CM

## 2015-01-21 DIAGNOSIS — T39395A Adverse effect of other nonsteroidal anti-inflammatory drugs [NSAID], initial encounter: Secondary | ICD-10-CM

## 2015-01-21 LAB — COMPREHENSIVE METABOLIC PANEL
ALT: 13 U/L (ref 0–35)
AST: 22 U/L (ref 0–37)
Albumin: 3.9 g/dL (ref 3.5–5.2)
Alkaline Phosphatase: 105 U/L (ref 39–117)
BUN: 16 mg/dL (ref 6–23)
CHLORIDE: 103 meq/L (ref 96–112)
CO2: 27 meq/L (ref 19–32)
CREATININE: 0.99 mg/dL (ref 0.40–1.20)
Calcium: 9.5 mg/dL (ref 8.4–10.5)
GFR: 60.34 mL/min (ref >60.00)
GLUCOSE: 67 mg/dL — AB (ref 70–99)
Potassium: 3.9 mEq/L (ref 3.5–5.1)
Sodium: 140 mEq/L (ref 135–145)
Total Bilirubin: 0.3 mg/dL (ref 0.2–1.2)
Total Protein: 7 g/dL (ref 6.0–8.3)

## 2015-01-21 LAB — HEMOGLOBIN A1C: Hgb A1c MFr Bld: 4.8 % (ref 4.6–6.5)

## 2015-01-21 LAB — H. PYLORI ANTIBODY, IGG: H PYLORI IGG: NEGATIVE

## 2015-01-21 LAB — IBC PANEL
IRON: 74 ug/dL (ref 42–145)
SATURATION RATIOS: 24.8 % (ref 20.0–50.0)
Transferrin: 213 mg/dL (ref 212.0–360.0)

## 2015-01-21 LAB — LDL CHOLESTEROL, DIRECT: LDL DIRECT: 144 mg/dL

## 2015-01-21 MED ORDER — LOSARTAN POTASSIUM-HCTZ 50-12.5 MG PO TABS: 1.0000 | ORAL_TABLET | Freq: Every day | ORAL | Status: DC

## 2015-01-21 MED ORDER — ESOMEPRAZOLE MAGNESIUM 40 MG PO CPDR: 40.0000 mg | DELAYED_RELEASE_CAPSULE | Freq: Every day | ORAL | Status: DC

## 2015-01-21 MED ORDER — ATORVASTATIN CALCIUM 20 MG PO TABS: 20.0000 mg | ORAL_TABLET | Freq: Every day | ORAL | Status: DC

## 2015-01-21 MED ORDER — TRAMADOL HCL 50 MG PO TABS: 100.0000 mg | ORAL_TABLET | Freq: Four times a day (QID) | ORAL | Status: DC | PRN

## 2015-01-21 NOTE — Patient Instructions (Addendum)
I am starting you on losartan/hct for you blood pressure  I am starting you on atorvastatin for your cholesterol  DO NOT TAKE ANY MORE MOTRIN , ALEVE, OR ASPIRIN  Referral to GI for endoscopy and colonoscopy is under way   Resume Nexium ASAP

## 2015-01-21 NOTE — Progress Notes (Signed)
Pre-visit discussion using our clinic review tool. No additional management support is needed unless otherwise documented below in the visit note.  

## 2015-01-21 NOTE — Progress Notes (Signed)
Subjective:  Patient ID: Pamela Romero, female    DOB: August 02, 1950  Age: 64 y.o. MRN: [id number]  CC: The primary encounter diagnosis was Dysphagia, pharyngoesophageal phase. 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  HPI Pamela Romero presents for postprandial nausea and vomiting.  She was last seen 8 months ago. The nausea and vomiting has  been intermittent and occurring about once a week for the past 6 to 8 months,  Attributed it initially to acid reflux . Starts  About 30 to 60 mintues after eating with burning in the esophagus  ,  then mouth starts watering,,  Then suddenly becomes nauseated ad vomits. .She has also been having dysphagia for meat and bread for the past year, and has had to regurgitate at times.  She has a history of  esophageal stricture secondary to reflux eshophagitis and has had 2  prior  dilations, last EGD over 8 years ago by Dr. Nicolasa Ducking. Was told she would not be able to have any more dilations .  Stopped her PPI  Several months ago and has been taking zantac 75  Up to 4 daily bc she heard that daily PPI s caused dementia and other problems.   Also using alka seltzer,  Tums,  Etc with no improvement .  Still taking motrin and aleve nearly daily  For joint pain,   No prior colonoscopy despite multiple referrals,  because " t can't drink that stuff".   Tobacco abuse;  She  has reduced cigs to 4 daily , attributes her reduction to an enforced smoking cessation program that her employer has instituted  That is tied to compensation.   Not taking her Lipitor or her HCTZ.  Reports compliance with levothyroxine.    Outpatient Prescriptions Prior to Visit  Medication Sig Dispense Refill  . docusate sodium (COLACE) 100 MG capsule Take 100 mg by mouth 2 (two) times daily as needed.    [name] levothyroxine (SYNTHROID, LEVOTHROID) 200 MCG tablet 1 tablet daily 30 tablet 6  . atorvastatin (LIPITOR) 20 MG tablet Take 1 tablet (20 mg total) by mouth daily. 90 tablet 1  . ibuprofen (ADVIL,MOTRIN) 600 MG tablet     . traMADol (ULTRAM) 50 MG tablet TAKE 2 TABLETS BY MOUTH EVERY 6 HOURS AS NEEDED 240 tablet 0  . atorvastatin (LIPITOR) 20 MG tablet Take 1 tablet (20 mg total) by mouth daily. (Patient not taking: Reported on 05/14/2014) 90 tablet 3  . hydrochlorothiazide (HYDRODIURIL) 25 MG tablet Take 1 tablet (25 mg total) by mouth daily. (Patient not taking: Reported on 01/21/2015) 90 tablet 1   No facility-administered medications prior to visit.    Review of Systems;  Patient denies headache, fevers, malaise, unintentional weight loss, skin rash, eye pain, sinus congestion and sinus pain, sore throat, ,  hemoptysis , cough, dyspnea, wheezing, chest pain, palpitations, orthopnea, edema, abdominal pain,  melena, diarrhea, constipation, flank pain, dysuria, hematuria, urinary  Frequency, nocturia, numbness, tingling, seizures,  Focal weakness, Loss of consciousness,  Tremor, insomnia, depression, anxiety, and suicidal ideation.      Objective:  BP  138/96 mmHg  Pulse 92  Temp(Src) 98.1 F (36.7 C) (Oral)  Resp 12  Ht 5\' 8"  (1.727 m)  Wt 207 lb 2 oz (93.951 kg)  BMI 31.50 kg/m2  SpO2 98%  BP Readings from Last 3 Encounters:  01/21/15 138/96  05/14/14 140/100  04/01/14 137/99    Wt Readings from Last 3 Encounters:  01/21/15 207 lb 2 oz (93.951 kg)  05/14/14 212 lb 4 oz  (96.276 kg)  01/02/14 210 lb (95.255 kg)    General appearance: alert, cooperative and appears stated age Ears: normal TM's and external ear canals both ears Throat: lips, mucosa, and tongue normal; teeth and gums normal Neck: no adenopathy, no carotid bruit, supple, symmetrical, trachea midline and thyroid not enlarged, symmetric, no tenderness/mass/nodules Back: symmetric, no curvature. ROM normal. No CVA tenderness. Lungs: clear to auscultation bilaterally Heart: regular rate and rhythm, S1, S2 normal, no murmur, click, rub or gallop Abdomen: soft, tender in mid epigastric region without guarding , bowel sounds normal; no masses,  no organomegaly Pulses: 2+ and symmetric Skin: Skin color, texture, turgor normal. No rashes or lesions Lymph nodes: Cervical, supraclavicular, and axillary nodes normal.  Lab Results  Component Value Date   HGBA1C 4.8 01/21/2015    Lab Results  Component Value Date   CREATININE 0.99 01/21/2015   CREATININE 0.99 05/14/2014   CREATININE 1.0 12/18/2013    Lab Results  Component Value Date   WBC 4.6 09/16/2013   HGB 12.1 09/16/2013   HCT 36.3 09/16/2013   PLT 127.0* 09/16/2013   GLUCOSE 67* 01/21/2015   CHOL 289* 05/14/2014   TRIG 210.0* 05/14/2014   HDL 53.10 05/14/2014   LDLDIRECT 144.0 01/21/2015   LDLCALC 160* 12/18/2013   ALT 13 01/21/2015   AST 22 01/21/2015   NA 140 01/21/2015   K 3.9 01/21/2015   CL 103 01/21/2015   CREATININE 0.99 01/21/2015   BUN 16 01/21/2015   CO2 27 01/21/2015   TSH 56.08* 05/14/2014   HGBA1C 4.8 01/21/2015    No results found.  Assessment & Plan:   Problem List Items Addressed This Visit    Tobacco abuse counseling    Smoking cessation instruction/counseling given:  commended patient for quitting and reviewed strategies for preventing relapses.  At last visit she had reduce ue to 2 cigs daily,  She is currently smoking  4 cigarettes daily         Hypothyroidism    LAST TSH was wildly high due to  noncompliance with medication.  She reports she is taking her T4 daily,  TSH is pending    Lab Results  Component Value Date   TSH 56.08* 05/14/2014         Hypertension goal BP (blood pressure) < 130/80    Elevated today due to medication noncompliance and use of NSAIDs. .  Lab Results  Component Value Date   NA 140 01/21/2015   K 3.9 01/21/2015   CL 103 01/21/2015   CO2 27 01/21/2015   Lab Results  Component Value Date   CREATININE 0.99 01/21/2015        Relevant Medications   losartan-hydrochlorothiazide (HYZAAR) 50-12.5 MG tablet   atorvastatin (LIPITOR) 20 MG tablet   Esophageal reflux    With symptoms suggestive of recurrence of stricture ,  Aggravated by patient's self discontinuation of PPI .  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  Resuming Nexium and referral for endoscopy in process. H Pylori Ag pending .  She is not anemic or iron deficient by today's labs.  Lab Results  Component Value Date   WBC 4.6 09/16/2013   HGB 12.1 09/16/2013   HCT 36.3 09/16/2013   MCV 92.9 09/16/2013   PLT 127.0* 09/16/2013   Lab Results  Component Value Date   IRON 74 01/21/2015        Hyperlipidemia LDL goal <100    Untreated secondary to medication nonadherence.  Non fasting direct LDL is 144 today.  Advised to resume Lipitor.    Lab Results  Component Value Date   CHOL 289* 05/14/2014   HDL 53.10 05/14/2014   LDLCALC 160* 12/18/2013   LDLDIRECT 144.0 01/21/2015   TRIG 210.0* 05/14/2014   CHOLHDL 5 05/14/2014         Relevant Medications   losartan-hydrochlorothiazide (HYZAAR) 50-12.5 MG tablet   atorvastatin (LIPITOR) 20 MG tablet    Other Visit Diagnoses    Dysphagia, pharyngoesophageal phase    -  Primary    Relevant Orders     Ambulatory referral to Gastroenterology    History of esophageal stricture        Relevant Orders    Ambulatory referral to Gastroenterology    Postprandial nausea        Relevant Orders    Ambulatory referral to Gastroenterology    Esophagitis        Essential hypertension        Relevant Medications    losartan-hydrochlorothiazide (HYZAAR) 50-12.5 MG tablet    atorvastatin (LIPITOR) 20 MG tablet    Other Relevant Orders    Comprehensive metabolic panel (Completed)    Microalbumin / creatinine urine ratio    Hyperlipidemia        Relevant Medications    losartan-hydrochlorothiazide (HYZAAR) 50-12.5 MG tablet    atorvastatin (LIPITOR) 20 MG tablet    Other Relevant Orders    LDL cholesterol, direct (Completed)    Tobacco abuse        Gastritis and gastroduodenitis        Relevant Orders    H. pylori antibody, IgG (Completed)    IBC panel (Completed)    Obesity        Relevant Orders    Hemoglobin A1c (Completed)    Screen for STD (sexually transmitted disease)        Relevant Orders    HIV antibody (Completed)    Hepatitis C antibody (Completed)    Breast cancer screening        Relevant Orders    MM DIGITAL SCREENING BILATERAL    Need for prophylactic vaccination against Streptococcus pneumoniae (pneumococcus)        Relevant Orders    Pneumococcal conjugate vaccine 13-valent (Completed)    Encounter for immunization           I have discontinued Ms. Karney's ibuprofen and hydrochlorothiazide. I have also changed her traMADol. Additionally, I am having her start on losartan-hydrochlorothiazide and esomeprazole. Lastly, I am having her maintain her docusate sodium, levothyroxine, and atorvastatin.  Meds ordered this encounter  Medications  . losartan-hydrochlorothiazide (HYZAAR) 50-12.5 MG tablet    Sig: Take 1 tablet by mouth daily.    Dispense:  90 tablet    Refill:  1  . traMADol (ULTRAM) 50  MG tablet    Sig: Take 2 tablets (100 mg total) by mouth every 6  (six) hours as needed.    Dispense:  240 tablet    Refill:  5    This request is for a new prescription for a controlled substance as required by Federal/State law..  . atorvastatin (LIPITOR) 20 MG tablet    Sig: Take 1 tablet (20 mg total) by mouth daily.    Dispense:  90 tablet    Refill:  1  . esomeprazole (NEXIUM) 40 MG capsule    Sig: Take 1 capsule (40 mg total) by mouth daily.    Dispense:  30 capsule    Refill:  3    Medications Discontinued During This Encounter  Medication Reason  . atorvastatin (LIPITOR) 20 MG tablet Duplicate  . traMADol (ULTRAM) 50 MG tablet Reorder  . hydrochlorothiazide (HYDRODIURIL) 25 MG tablet   . ibuprofen (ADVIL,MOTRIN) 600 MG tablet   . atorvastatin (LIPITOR) 20 MG tablet Reorder    Follow-up: Return in about 4 weeks (around 02/18/2015).   Crecencio Mc, MD

## 2015-01-22 ENCOUNTER — Telehealth: Payer: Self-pay

## 2015-01-22 ENCOUNTER — Other Ambulatory Visit (INDEPENDENT_AMBULATORY_CARE_PROVIDER_SITE_OTHER): Payer: BLUE CROSS/BLUE SHIELD

## 2015-01-22 DIAGNOSIS — E034 Atrophy of thyroid (acquired): Secondary | ICD-10-CM

## 2015-01-22 DIAGNOSIS — E038 Other specified hypothyroidism: Secondary | ICD-10-CM | POA: Diagnosis not present

## 2015-01-22 LAB — TSH: TSH: 1.96 u[IU]/mL (ref 0.35–4.50)

## 2015-01-22 LAB — HEPATITIS C ANTIBODY: HCV AB: NEGATIVE

## 2015-01-22 LAB — HIV ANTIBODY (ROUTINE TESTING W REFLEX): HIV 1&2 Ab, 4th Generation: NONREACTIVE

## 2015-01-22 NOTE — Assessment & Plan Note (Signed)
Untreated secondary to medication nonadherence.  Non fasting direct LDL is 144 today.  Advised to resume Lipitor.    Lab Results  Component Value Date   CHOL 289* 05/14/2014   HDL 53.10 05/14/2014   LDLCALC 160* 12/18/2013   LDLDIRECT 144.0 01/21/2015   TRIG 210.0* 05/14/2014   CHOLHDL 5 05/14/2014

## 2015-01-22 NOTE — Assessment & Plan Note (Signed)
Elevated today due to medication noncompliance and use of NSAIDs. .  Lab Results  Component Value Date   NA 140 01/21/2015   K 3.9 01/21/2015   CL 103 01/21/2015   CO2 27 01/21/2015   Lab Results  Component Value Date   CREATININE 0.99 01/21/2015

## 2015-01-22 NOTE — Telephone Encounter (Signed)
PA started for patient on Nexium

## 2015-01-22 NOTE — Assessment & Plan Note (Addendum)
Recurrent, secondary to daily  use of OTC NSAIDs and self discontinuation of PPI.  Resuming Nexium and referral for endoscopy in process. H Pylori Ag pending .  She is not anemic or iron deficient by today's labs.  Lab Results  Component Value Date   WBC 4.6 09/16/2013   HGB 12.1 09/16/2013   HCT 36.3 09/16/2013   MCV 92.9 09/16/2013   PLT 127.0* 09/16/2013   Lab Results  Component Value Date   IRON 74 01/21/2015

## 2015-01-22 NOTE — Assessment & Plan Note (Signed)
LAST TSH was wildly high due to noncompliance with medication.  She reports she is taking her T4 daily,  TSH is pending    Lab Results  Component Value Date   TSH 56.08* 05/14/2014

## 2015-01-22 NOTE — Assessment & Plan Note (Signed)
With symptoms suggestive of recurrence of stricture ,  Aggravated by patient's self discontinuation of PPI .  Advised patient to NEVER discontinue  PPI and ref to GI for EGD

## 2015-01-22 NOTE — Assessment & Plan Note (Signed)
Smoking cessation instruction/counseling given:  commended patient for quitting and reviewed strategies for preventing relapses.  At last visit she had reduce ue to 2 cigs daily,  She is currently smoking  4 cigarettes daily

## 2015-01-23 ENCOUNTER — Encounter: Payer: Self-pay | Admitting: *Deleted

## 2015-03-11 ENCOUNTER — Other Ambulatory Visit: Payer: Self-pay | Admitting: Internal Medicine

## 2015-03-27 ENCOUNTER — Encounter: Payer: Self-pay | Admitting: Internal Medicine

## 2015-04-24 ENCOUNTER — Other Ambulatory Visit: Payer: Self-pay | Admitting: Internal Medicine

## 2015-04-24 ENCOUNTER — Ambulatory Visit
Admission: RE | Admit: 2015-04-24 | Discharge: 2015-04-24 | Disposition: A | Discharge status: Home / Self Care | Payer: BLUE CROSS/BLUE SHIELD | Source: Ambulatory Visit | Attending: Internal Medicine | Admitting: Internal Medicine

## 2015-04-24 DIAGNOSIS — N644 Mastodynia: Secondary | ICD-10-CM

## 2015-04-24 DIAGNOSIS — Z1239 Encounter for other screening for malignant neoplasm of breast: Secondary | ICD-10-CM

## 2015-04-28 ENCOUNTER — Ambulatory Visit
Admission: RE | Admit: 2015-04-28 | Discharge: 2015-04-28 | Disposition: A | Discharge status: Home / Self Care | Payer: BLUE CROSS/BLUE SHIELD | Source: Ambulatory Visit | Attending: Internal Medicine | Admitting: Internal Medicine

## 2015-04-28 DIAGNOSIS — N644 Mastodynia: Secondary | ICD-10-CM | POA: Diagnosis present

## 2015-04-28 DIAGNOSIS — N63 Unspecified lump in breast: Secondary | ICD-10-CM | POA: Insufficient documentation

## 2015-05-29 ENCOUNTER — Ambulatory Visit: Payer: BLUE CROSS/BLUE SHIELD | Admitting: Internal Medicine

## 2015-06-15 ENCOUNTER — Ambulatory Visit: Payer: BLUE CROSS/BLUE SHIELD | Admitting: Internal Medicine

## 2015-11-02 NOTE — Telephone Encounter (Signed)
My Chart message sent

## 2016-07-04 ENCOUNTER — Other Ambulatory Visit: Payer: Self-pay | Admitting: Internal Medicine

## 2016-07-04 DIAGNOSIS — Z1231 Encounter for screening mammogram for malignant neoplasm of breast: Secondary | ICD-10-CM

## 2016-07-29 ENCOUNTER — Ambulatory Visit: Payer: BLUE CROSS/BLUE SHIELD

## 2016-08-02 ENCOUNTER — Ambulatory Visit
Admission: RE | Admit: 2016-08-02 | Discharge: 2016-08-02 | Disposition: A | Discharge status: Home / Self Care | Payer: BLUE CROSS/BLUE SHIELD | Source: Ambulatory Visit | Attending: Internal Medicine | Admitting: Internal Medicine

## 2016-08-02 DIAGNOSIS — Z1231 Encounter for screening mammogram for malignant neoplasm of breast: Secondary | ICD-10-CM | POA: Diagnosis not present

## 2016-11-22 ENCOUNTER — Other Ambulatory Visit: Payer: Self-pay | Admitting: Nurse Practitioner

## 2016-11-22 DIAGNOSIS — M79605 Pain in left leg: Secondary | ICD-10-CM

## 2016-11-23 ENCOUNTER — Ambulatory Visit
Admission: RE | Admit: 2016-11-23 | Discharge: 2016-11-23 | Disposition: A | Discharge status: Home / Self Care | Payer: BLUE CROSS/BLUE SHIELD | Source: Ambulatory Visit | Attending: Nurse Practitioner | Admitting: Nurse Practitioner

## 2016-11-23 DIAGNOSIS — M79605 Pain in left leg: Secondary | ICD-10-CM | POA: Diagnosis present

## 2016-11-23 DIAGNOSIS — M7989 Other specified soft tissue disorders: Secondary | ICD-10-CM | POA: Diagnosis not present

## 2017-02-02 ENCOUNTER — Other Ambulatory Visit: Payer: Self-pay | Admitting: Family Medicine

## 2017-02-02 DIAGNOSIS — R55 Syncope and collapse: Secondary | ICD-10-CM

## 2017-02-02 DIAGNOSIS — R0602 Shortness of breath: Secondary | ICD-10-CM

## 2017-02-06 ENCOUNTER — Ambulatory Visit: Payer: BLUE CROSS/BLUE SHIELD

## 2017-02-08 ENCOUNTER — Ambulatory Visit
Admission: RE | Admit: 2017-02-08 | Discharge: 2017-02-08 | Disposition: A | Discharge status: Home / Self Care | Payer: BLUE CROSS/BLUE SHIELD | Source: Ambulatory Visit | Attending: Family Medicine | Admitting: Family Medicine

## 2017-02-08 DIAGNOSIS — I6523 Occlusion and stenosis of bilateral carotid arteries: Secondary | ICD-10-CM | POA: Insufficient documentation

## 2017-02-08 DIAGNOSIS — R55 Syncope and collapse: Secondary | ICD-10-CM | POA: Diagnosis not present

## 2017-02-09 ENCOUNTER — Ambulatory Visit: Admission: RE | Admit: 2017-02-09 | Payer: BLUE CROSS/BLUE SHIELD | Source: Ambulatory Visit

## 2017-02-09 ENCOUNTER — Ambulatory Visit
Admission: RE | Admit: 2017-02-09 | Discharge: 2017-02-09 | Disposition: A | Discharge status: Home / Self Care | Payer: BLUE CROSS/BLUE SHIELD | Source: Ambulatory Visit | Attending: Family Medicine | Admitting: Family Medicine

## 2017-02-09 ENCOUNTER — Other Ambulatory Visit: Payer: Self-pay | Admitting: Nurse Practitioner

## 2017-02-09 ENCOUNTER — Ambulatory Visit
Admission: RE | Admit: 2017-02-09 | Discharge: 2017-02-09 | Disposition: A | Discharge status: Home / Self Care | Payer: BLUE CROSS/BLUE SHIELD | Source: Ambulatory Visit | Attending: Nurse Practitioner | Admitting: Nurse Practitioner

## 2017-02-09 DIAGNOSIS — R0789 Other chest pain: Secondary | ICD-10-CM

## 2017-02-09 DIAGNOSIS — I2510 Atherosclerotic heart disease of native coronary artery without angina pectoris: Secondary | ICD-10-CM | POA: Insufficient documentation

## 2017-02-09 DIAGNOSIS — I10 Essential (primary) hypertension: Secondary | ICD-10-CM | POA: Insufficient documentation

## 2017-02-09 DIAGNOSIS — E785 Hyperlipidemia, unspecified: Secondary | ICD-10-CM | POA: Diagnosis not present

## 2017-02-09 DIAGNOSIS — E079 Disorder of thyroid, unspecified: Secondary | ICD-10-CM | POA: Diagnosis not present

## 2017-02-09 DIAGNOSIS — Z720 Tobacco use: Secondary | ICD-10-CM | POA: Diagnosis not present

## 2017-02-09 DIAGNOSIS — R0602 Shortness of breath: Secondary | ICD-10-CM

## 2017-02-09 DIAGNOSIS — R9431 Abnormal electrocardiogram [ECG] [EKG]: Secondary | ICD-10-CM | POA: Insufficient documentation

## 2017-02-09 NOTE — Progress Notes (Signed)
*  PRELIMINARY RESULTS* Echocardiogram 2D Echocardiogram has been performed.  Pamela Romero 02/09/2017, 10:30 AM

## 2017-04-05 ENCOUNTER — Other Ambulatory Visit: Payer: Self-pay

## 2017-04-17 ENCOUNTER — Ambulatory Visit: Payer: BLUE CROSS/BLUE SHIELD | Admitting: Nurse Practitioner

## 2017-04-17 ENCOUNTER — Encounter: Payer: Self-pay | Admitting: Nurse Practitioner

## 2017-04-17 VITALS — BP 130/80 | HR 89 | Resp 16 | Ht 68.0 in | Wt 230.0 lb

## 2017-04-17 DIAGNOSIS — M722 Plantar fascial fibromatosis: Secondary | ICD-10-CM | POA: Diagnosis not present

## 2017-04-17 DIAGNOSIS — L03116 Cellulitis of left lower limb: Secondary | ICD-10-CM | POA: Insufficient documentation

## 2017-04-17 DIAGNOSIS — F17210 Nicotine dependence, cigarettes, uncomplicated: Secondary | ICD-10-CM | POA: Insufficient documentation

## 2017-04-17 DIAGNOSIS — N189 Chronic kidney disease, unspecified: Secondary | ICD-10-CM | POA: Insufficient documentation

## 2017-04-17 DIAGNOSIS — M064 Inflammatory polyarthropathy: Secondary | ICD-10-CM | POA: Insufficient documentation

## 2017-04-17 DIAGNOSIS — E039 Hypothyroidism, unspecified: Secondary | ICD-10-CM

## 2017-04-17 DIAGNOSIS — F32A Depression, unspecified: Secondary | ICD-10-CM | POA: Insufficient documentation

## 2017-04-17 DIAGNOSIS — F419 Anxiety disorder, unspecified: Secondary | ICD-10-CM | POA: Insufficient documentation

## 2017-04-17 DIAGNOSIS — F411 Generalized anxiety disorder: Secondary | ICD-10-CM | POA: Insufficient documentation

## 2017-04-17 DIAGNOSIS — R0602 Shortness of breath: Secondary | ICD-10-CM | POA: Diagnosis not present

## 2017-04-17 DIAGNOSIS — F330 Major depressive disorder, recurrent, mild: Secondary | ICD-10-CM | POA: Diagnosis not present

## 2017-04-17 DIAGNOSIS — R609 Edema, unspecified: Secondary | ICD-10-CM | POA: Insufficient documentation

## 2017-04-17 DIAGNOSIS — N183 Chronic kidney disease, stage 3 unspecified: Secondary | ICD-10-CM | POA: Insufficient documentation

## 2017-04-17 MED ORDER — DICLOFENAC SODIUM 50 MG PO TBEC: 50.0000 mg | DELAYED_RELEASE_TABLET | Freq: Two times a day (BID) | ORAL | 3 refills | Status: DC

## 2017-04-17 MED ORDER — TRAMADOL HCL 50 MG PO TABS: 100.0000 mg | ORAL_TABLET | Freq: Four times a day (QID) | ORAL | 2 refills | Status: DC | PRN

## 2017-04-17 NOTE — Progress Notes (Signed)
[facility] [place], [place] [address]  Internal MEDICINE  Office Visit Note  Patient Name: Pamela Romero  [id number]  [id number]  Date of Service: 04/17/2017     Complaints/HPI Pt is here for routine follow up.  The patient is here for routine follo up visit. States that both ears are itchy and ringing all the time. Both are tender and sore.  The problems with her ears have been going on for about 2 months.  She also states that pain and doscomfort of the right foot continues to get worse. Has been to see podiatry for this. Did have injections into the foot which did help for about a week. X-rays have not shown anything significant or acute. The last time she saw podiatry was about a year ago.  Since her most recent visit, she has had her echocardiogram and carotid doppler studies done and needs to review the results.     Current Medication: Outpatient Encounter Medications as of 04/17/2017  Medication Sig  . docusate sodium (COLACE) 100 MG capsule Take 100 mg by mouth 2 (two) times daily as needed.  . DULoxetine (CYMBALTA) 20 MG capsule Take 20 mg by mouth daily.  [name] esomeprazole (NEXIUM) 40 MG capsule Take 1 capsule (40 mg total) by mouth daily.  [name] levothyroxine (SYNTHROID, LEVOTHROID) 200 MCG tablet TAKE 1 TABLET BY MOUTH EVERY DAY  . rosuvastatin (CRESTOR) 5 MG tablet Take 5 mg by mouth at bedtime.  . traMADol (ULTRAM) 50 MG tablet Take 2 tablets (100 mg total) by mouth every 6 (six) hours as needed.  . [DISCONTINUED] traMADol (ULTRAM) 50 MG tablet Take 2 tablets (100 mg total) by mouth every 6 (six) hours as needed.  [name] atorvastatin (LIPITOR) 20 MG tablet Take 1 tablet (20 mg total) by mouth daily. (Patient not taking: Reported on 04/17/2017)  . diclofenac (VOLTAREN) 50 MG EC tablet Take 1 tablet (50 mg total) by mouth 2 (two) times daily.  [name] losartan-hydrochlorothiazide (HYZAAR) 50-12.5 MG tablet Take 1 tablet by mouth daily. (Patient not taking:  Reported on 04/17/2017)  . [DISCONTINUED] diclofenac (VOLTAREN) 50 MG EC tablet TAKE 1 TABLET(S) BY MOUTH TWICE A DAY AS NEEDED FOR PAIN   No facility-administered encounter medications on file as of 04/17/2017.     Surgical History: Past Surgical History:  Procedure Laterality Date  . BREAST CYST ASPIRATION Right    neg  . BREAST SURGERY Right March 2014   benign [place]  . CARDIAC CATHETERIZATION  2006   40% LAD, EF 60%  . THYROIDECTOMY  2005   partial at [facility], non malignant tumor  . TOTAL ABDOMINAL HYSTERECTOMY     with BSO    Medical History: Past Medical History:  Diagnosis Date  . Abnormal EKG   . CAD (coronary artery disease)    non critical, by cardiac cath 2006  . Cancer ([place])    endometrial Ca  . Endometrial adenocarcinoma ([place]) 2011  . Hyperlipidemia   . Hypertension   . Osteoarthritis   . Reflux gastritis   . Thyroid disease   . tobacco abuse   . Vaginal delivery    x 2    Family History: Family History  Problem Relation Age of Onset  . Alcohol abuse Father   . Cancer Maternal Aunt        stomach  . Alcohol abuse Maternal Aunt   . Alcohol abuse Mother   . Heart disease Brother        MI  age 32- non smoker  . Cancer Brother        colon, dx'd age 23  . Cancer Brother        stomach, paternal half brother  . Breast cancer Neg Hx     Social History   Socioeconomic History  . Marital status: Married    Spouse name: Not on file  . Number of children: 0  . Years of education: Not on file  . Highest education level: Not on file  Social Needs  . Financial resource strain: Not on file  . Food insecurity - worry: Not on file  . Food insecurity - inability: Not on file  . Transportation needs - medical: Not on file  . Transportation needs - non-medical: Not on file  Occupational History  . Occupation: Public affairs consultant- full time  . Occupation: [employer]- part time  Tobacco Use  . Smoking status: Current Every Day Smoker  . Smokeless tobacco: Never  Used  . Tobacco comment: 2-3 cigs a day  Substance and Sexual Activity  . Alcohol use: No  . Drug use: No  . Sexual activity: Not on file  Other Topics Concern  . Not on file  Social History Narrative   Lives with spouse, takes care of her mother.   Had 2 children, one died at birth, the other given up for adoption (was unmarried).      Review of Systems  Constitutional: Negative.   HENT: Positive for ear pain and hearing loss.        Ringing in the ears.   Respiratory: Negative.   Cardiovascular: Negative for palpitations.  Gastrointestinal: Negative.   Endocrine: Negative.        Thyroid issues - due to have thyroid panel rechecked.   Genitourinary: Negative.   Musculoskeletal: Positive for arthralgias and myalgias.  Skin: Negative.   Allergic/Immunologic: Negative.   Neurological: Positive for headaches.  Hematological: Negative.   Psychiatric/Behavioral: The patient is nervous/anxious.     Today's Vitals   04/17/17 0857  BP: 130/80  Pulse: 89  Resp: 16  SpO2: 96%  Weight: 130 lb 12.8 oz (59.3 kg)  Height: 5\' 8"  (1.727 m)     Physical Exam  Constitutional: She is oriented to person, place, and time. She appears well-developed and well-nourished.  HENT:  Head: Normocephalic and atraumatic.  Right Ear: External ear normal.  Left Ear: External ear normal.  Eyes: Pupils are equal, round, and reactive to light.  Neck: Normal range of motion. Neck supple.  Cardiovascular: Normal rate, regular rhythm and normal heart sounds.  Pulmonary/Chest: Effort normal and breath sounds normal. She has no wheezes. She exhibits no tenderness.  Abdominal: Soft. Bowel sounds are normal. There is no tenderness.  Musculoskeletal:       Feet:  Neurological: She is alert and oriented to person, place, and time.  Skin: Skin is warm and dry.  Psychiatric: She has a normal mood and affect.  Nursing note and vitals reviewed.     Assessment/Plan:    ICD-10-CM   1. Plantar  fascia syndrome M72.2 Ambulatory referral to Podiatry    traMADol (ULTRAM) 50 MG tablet    diclofenac (VOLTAREN) 50 MG EC tablet  2. Acquired hypothyroidism E03.9 TSH + free T4  3. Major depressive disorder, recurrent, mild (HCC) F33.0   4. 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 Nicotine dependence, cigarettes, uncomplicated B01.751   6. Shortness of breath  R06.02    1. Continue diclofenac 50mg  bid prn pain/iflammation. May take tramadol 100mg  every four to six hours as needed for more severe pain. A new rx for #180 tablets given. Patient was also given a new referral to podiatry for further evaluation and treatment of plantar fasciitis.  2. Check thyroid panel and adjust levothyroxine as indicated  3. Continue duloxetine as prescribed  4. Diclofenac 50mg  twice daily to reduce pain/infalmmation. Tramadol 100mg  every 4 to 6 hours as needed for severe, breakthrough pain.  5. Smoking cessation information provided.  6. Reviewed carotid duplex study showing moderate plaque ithout stenosis in both carotid arteries. Echocardiogram looked good as well. Continue statin for cholesterol and monitor closely.    General Counseling: I have discussed the findings of the evaluation and examination with Darolyn.  I have also discussed any further diagnostic evaluation that may be needed or ordered today. Brice verbalizes understanding of the findings of todays visit. We also reviewed her medications today. she has been encouraged to call the office with any questions or concerns that should arise related to todays visit.  This patient was seen by Leretha Pol, FNP- C in Collaboration with Dr Lavera Guise as a part of collaborative care agreement  The patient should follow up in 3 months and sooner if needed     Time spent: 20 minutes    Dr Lavera Guise Internal medicine

## 2017-04-25 ENCOUNTER — Ambulatory Visit: Payer: Self-pay | Admitting: Podiatry

## 2017-05-12 ENCOUNTER — Ambulatory Visit: Payer: Self-pay | Admitting: Podiatry

## 2017-05-31 ENCOUNTER — Ambulatory Visit: Payer: Self-pay | Admitting: Internal Medicine

## 2017-06-29 ENCOUNTER — Ambulatory Visit: Payer: BLUE CROSS/BLUE SHIELD | Admitting: Nurse Practitioner

## 2017-06-29 ENCOUNTER — Encounter: Payer: Self-pay | Admitting: Nurse Practitioner

## 2017-06-29 VITALS — BP 148/98 | HR 86 | Resp 16 | Ht 68.0 in | Wt 228.0 lb

## 2017-06-29 DIAGNOSIS — M25562 Pain in left knee: Secondary | ICD-10-CM

## 2017-06-29 DIAGNOSIS — M545 Low back pain, unspecified: Secondary | ICD-10-CM

## 2017-06-29 DIAGNOSIS — I10 Essential (primary) hypertension: Secondary | ICD-10-CM | POA: Diagnosis not present

## 2017-06-29 DIAGNOSIS — E039 Hypothyroidism, unspecified: Secondary | ICD-10-CM | POA: Diagnosis not present

## 2017-06-29 MED ORDER — LOSARTAN POTASSIUM-HCTZ 50-12.5 MG PO TABS: 1.0000 | ORAL_TABLET | Freq: Every day | ORAL | 1 refills | Status: DC

## 2017-06-29 MED ORDER — LEVOTHYROXINE SODIUM 125 MCG PO TABS: 250.0000 ug | ORAL_TABLET | Freq: Every day | ORAL | 3 refills | Status: DC

## 2017-06-29 NOTE — Progress Notes (Signed)
[facility], [place] [address]  Internal MEDICINE  Office Visit Note  Patient Name: Pamela Romero  [id number]  [id number]  Date of Service: 07/19/2017  Chief Complaint  Patient presents with  . Knee Pain    left   . Back Pain    pain in upper back     The patient is here for sick visit. Today, she is complaining of left knee pain. Started suddenly. Hurts so much she can hardly sleep. Blood pressure is elevated. Likely due to the pain She is also c/o left flank pain. Hurts more when she bends from side to side. This has been getting worse over past few weeks.    Pt is here for routine follow up.    Current Medication: Outpatient Encounter Medications as of 06/29/2017  Medication Sig  . atorvastatin (LIPITOR) 20 MG tablet Take 1 tablet (20 mg total) by mouth daily.  . diclofenac (VOLTAREN) 50 MG EC tablet Take 1 tablet (50 mg total) by mouth 2 (two) times daily.  [name] docusate sodium (COLACE) 100 MG capsule Take 100 mg by mouth 2 (two) times daily as needed.  . DULoxetine (CYMBALTA) 20 MG capsule Take 20 mg by mouth daily.  [name] esomeprazole (NEXIUM) 40 MG capsule Take 1 capsule (40 mg total) by mouth daily.  . rosuvastatin (CRESTOR) 5 MG tablet Take 5 mg by mouth at bedtime.  . [DISCONTINUED] levothyroxine (SYNTHROID, LEVOTHROID) 125 MCG tablet Take 2 tablets (250 mcg total) by mouth daily.  . [DISCONTINUED] levothyroxine (SYNTHROID, LEVOTHROID) 200 MCG tablet TAKE 1 TABLET BY MOUTH EVERY DAY  . [DISCONTINUED] losartan-hydrochlorothiazide (HYZAAR) 50-12.5 MG tablet Take 1 tablet by mouth daily. (Patient not taking: Reported on 04/17/2017)  . [DISCONTINUED] losartan-hydrochlorothiazide (HYZAAR) 50-12.5 MG tablet Take 1 tablet by mouth daily.  . [DISCONTINUED] traMADol (ULTRAM) 50 MG tablet Take 2 tablets (100 mg total) by mouth every 6 (six) hours as needed.   No facility-administered encounter medications on file as of 06/29/2017.     Surgical  History: Past Surgical History:  Procedure Laterality Date  . BREAST CYST ASPIRATION Right    neg  . BREAST SURGERY Right March 2014   benign Byrnett  . CARDIAC CATHETERIZATION  2006   40% LAD, EF 60%  . THYROIDECTOMY  2005   partial at [facility], non malignant tumor  . TOTAL ABDOMINAL HYSTERECTOMY     with BSO    Medical History: Past Medical History:  Diagnosis Date  . Abnormal EKG   . CAD (coronary artery disease)    non critical, by cardiac cath 2006  . Cancer ([place])    endometrial Ca  . Endometrial adenocarcinoma ([place]) 2011  . Hyperlipidemia   . Hypertension   . Osteoarthritis   . Reflux gastritis   . Thyroid disease   . tobacco abuse   . Vaginal delivery    x 2    Family History: Family History  Problem Relation Age of Onset  . Alcohol abuse Father   . Cancer Maternal Aunt        stomach  . Alcohol abuse Maternal Aunt   . Alcohol abuse Mother   . Heart disease Brother        MI age 5- non smoker  . Cancer Brother        colon, dx'd age 58  . Cancer Brother        stomach, paternal half brother  . Breast cancer Neg Hx     Social  History   Socioeconomic History  . Marital status: Married    Spouse name: Not on file  . Number of children: 0  . Years of education: Not on file  . Highest education level: Not on file  Occupational History  . Occupation: Public affairs consultant- full time  . Occupation: Administrator, arts- part time  Social Needs  . Financial resource strain: Not on file  . Food insecurity:    Worry: Not on file    Inability: Not on file  . Transportation needs:    Medical: Not on file    Non-medical: Not on file  Tobacco Use  . Smoking status: Current Every Day Smoker  . Smokeless tobacco: Never Used  . Tobacco comment: 2-3 cigs a day  Substance and Sexual Activity  . Alcohol use: No  . Drug use: No  . Sexual activity: Not on file  Lifestyle  . Physical activity:    Days per week: Not on file    Minutes per session: Not on file  . Stress: Not  on file  Relationships  . Social connections:    Talks on phone: Not on file    Gets together: Not on file    Attends religious service: Not on file    Active member of club or organization: Not on file    Attends meetings of clubs or organizations: Not on file    Relationship status: Not on file  . Intimate partner violence:    Fear of current or ex partner: Not on file    Emotionally abused: Not on file    Physically abused: Not on file    Forced sexual activity: Not on file  Other Topics Concern  . Not on file  Social History Narrative   Lives with spouse, takes care of her mother.   Had 2 children, one died at birth, the other given up for adoption (was unmarried).      Review of Systems  Constitutional: Positive for activity change and fatigue.  HENT: Negative for ear pain, hearing loss and tinnitus.        Ringing in the ears.   Eyes: Negative.   Respiratory: Negative for cough, chest tightness, shortness of breath and wheezing.   Cardiovascular: Negative for chest pain and palpitations.  Gastrointestinal: Negative for constipation, diarrhea, nausea and vomiting.  Endocrine:       Thyroid issues - due to have thyroid panel rechecked.   Genitourinary: Negative.   Musculoskeletal: Positive for arthralgias, back pain and myalgias.       Left knee pain which is getting worse. Knee is swollen and it hurts to walk on it.  Low back pain. Pain worse with exertion.   Skin: Negative for rash.  Allergic/Immunologic: Negative for environmental allergies.  Neurological: Positive for headaches.  Hematological: Negative.   Psychiatric/Behavioral: The patient is nervous/anxious.     Today's Vitals   06/29/17 1116 06/29/17 1154  BP: (!) 138/98 (!) 148/98  Pulse: 86   Resp: 16   SpO2: 98%   Weight: 228 lb (103.4 kg)   Height: 5\' 8"  (1.727 m)     Physical Exam  Constitutional: She is oriented to person, place, and time. She appears well-developed and well-nourished. No  distress.  HENT:  Head: Normocephalic and atraumatic.  Mouth/Throat: Oropharynx is clear and moist. No oropharyngeal exudate.  Eyes: Pupils are equal, round, and reactive to light. EOM are normal.  Neck: Normal range of motion. Neck supple. No JVD present. No tracheal deviation present.  No thyromegaly present.  Cardiovascular: Normal rate, regular rhythm and normal heart sounds. Exam reveals no gallop and no friction rub.  No murmur heard. Pulmonary/Chest: Effort normal and breath sounds normal. No respiratory distress. She has no wheezes. She has no rales. She exhibits no tenderness.  Abdominal: Soft. Bowel sounds are normal. There is no tenderness.  Musculoskeletal:       Legs: Mild to moderate lower back pain. Pain is worse with bending and twisting at the waist. Pain also more severe with lifting, pushing, and pulling with the arms. No visible abnormalities or deformities noted today.   Lymphadenopathy:    She has no cervical adenopathy.  Neurological: She is alert and oriented to person, place, and time. No cranial nerve deficit.  Skin: Skin is warm and dry. She is not diaphoretic.  Psychiatric: She has a normal mood and affect. Her behavior is normal. Judgment and thought content normal.  Nursing note and vitals reviewed.  Assessment/Plan: 1. Acute left-sided low back pain without sciatica Will x-ray the lumbar and thoracic spine for further evaluation.  - DG Thoracic Spine 2 View; Future - DG Lumbar Spine Complete; Future  2. Acute pain of left knee X-ray left knee for further evaluation. Rest, ice, and elevated the knee when possible. Use ace bandage or brace to give knee support. May refer to orthopedics if indicated. - DG Knee Complete 4 Views Left; Future  3. Hypothyroidism, unspecified type Check thyroid panel and adjust levothyroxine dosing as indicated.   4. Essential hypertension Generally stable. Continue bp medication as prescribed.   General Counseling: Raiana  verbalizes understanding of the findings of todays visit and agrees with plan of treatment. I have discussed any further diagnostic evaluation that may be needed or ordered today. We also reviewed her medications today. she has been encouraged to call the office with any questions or concerns that should arise related to todays visit.   Apply a compressive ACE bandage. Rest and elevate the affected painful area.  Apply cold compresses intermittently as needed.  As pain recedes, begin normal activities slowly as tolerated.  Call if symptoms persist.  This patient was seen by Leretha Pol, FNP- C in Collaboration with Dr Lavera Guise as a part of collaborative care agreement  Orders Placed This Encounter  Procedures  . DG Knee Complete 4 Views Left  . DG Thoracic Spine 2 View  . DG Lumbar Spine Complete    Meds ordered this encounter  Medications  . DISCONTD: losartan-hydrochlorothiazide (HYZAAR) 50-12.5 MG tablet    Sig: Take 1 tablet by mouth daily.    Dispense:  90 tablet    Refill:  1    Order Specific Question:   Supervising Provider    Answer:   Lavera Guise [[id number]]  . DISCONTD: levothyroxine (SYNTHROID, LEVOTHROID) 125 MCG tablet    Sig: Take 2 tablets (250 mcg total) by mouth daily.    Dispense:  60 tablet    Refill:  3    Order Specific Question:   Supervising Provider    Answer:   Lavera Guise [[id number]]    Time spent: 8 Minutes       Dr Lavera Guise Internal medicine

## 2017-07-14 ENCOUNTER — Other Ambulatory Visit: Payer: Self-pay

## 2017-07-14 ENCOUNTER — Other Ambulatory Visit
Admission: RE | Admit: 2017-07-14 | Discharge: 2017-07-14 | Disposition: A | Discharge status: Home / Self Care | Payer: BLUE CROSS/BLUE SHIELD | Source: Ambulatory Visit | Attending: Nurse Practitioner | Admitting: Nurse Practitioner

## 2017-07-14 ENCOUNTER — Telehealth: Payer: Self-pay | Admitting: Nurse Practitioner

## 2017-07-14 DIAGNOSIS — E039 Hypothyroidism, unspecified: Secondary | ICD-10-CM | POA: Diagnosis present

## 2017-07-14 LAB — TSH: TSH: 40.992 u[IU]/mL — ABNORMAL HIGH (ref 0.350–4.500)

## 2017-07-14 LAB — T4, FREE: Free T4: 0.77 ng/dL (ref 0.61–1.12)

## 2017-07-14 NOTE — Telephone Encounter (Signed)
Pt is on levothyroxine 125 mg

## 2017-07-17 ENCOUNTER — Encounter: Payer: Self-pay | Admitting: Nurse Practitioner

## 2017-07-17 ENCOUNTER — Ambulatory Visit: Payer: BLUE CROSS/BLUE SHIELD | Admitting: Nurse Practitioner

## 2017-07-17 ENCOUNTER — Other Ambulatory Visit: Payer: Self-pay | Admitting: Nurse Practitioner

## 2017-07-17 VITALS — BP 120/83 | HR 91 | Resp 16 | Ht 68.5 in | Wt 219.0 lb

## 2017-07-17 DIAGNOSIS — M25562 Pain in left knee: Secondary | ICD-10-CM | POA: Diagnosis not present

## 2017-07-17 DIAGNOSIS — M722 Plantar fascial fibromatosis: Secondary | ICD-10-CM | POA: Diagnosis not present

## 2017-07-17 DIAGNOSIS — I10 Essential (primary) hypertension: Secondary | ICD-10-CM

## 2017-07-17 DIAGNOSIS — M064 Inflammatory polyarthropathy: Secondary | ICD-10-CM

## 2017-07-17 DIAGNOSIS — E039 Hypothyroidism, unspecified: Secondary | ICD-10-CM

## 2017-07-17 DIAGNOSIS — G8929 Other chronic pain: Secondary | ICD-10-CM | POA: Insufficient documentation

## 2017-07-17 MED ORDER — METHYLPREDNISOLONE 4 MG PO TBPK: ORAL_TABLET | ORAL | 0 refills | Status: DC

## 2017-07-17 MED ORDER — TRAMADOL HCL 50 MG PO TABS
100.0000 mg | ORAL_TABLET | Freq: Four times a day (QID) | ORAL | 2 refills | Status: DC | PRN
Start: 2017-07-17 — End: 2017-07-17

## 2017-07-17 MED ORDER — LEVOTHYROXINE SODIUM 150 MCG PO TABS: 300.0000 ug | ORAL_TABLET | Freq: Every day | ORAL | 3 refills | Status: DC

## 2017-07-17 MED ORDER — LOSARTAN POTASSIUM-HCTZ 50-12.5 MG PO TABS: 1.0000 | ORAL_TABLET | Freq: Every day | ORAL | 1 refills | Status: DC

## 2017-07-17 MED ORDER — TRAMADOL HCL 50 MG PO TABS: 100.0000 mg | ORAL_TABLET | Freq: Four times a day (QID) | ORAL | 1 refills | Status: DC | PRN

## 2017-07-17 NOTE — Progress Notes (Signed)
Changed tramadol, as prior prescription written in error. rx is for #120 tablets with 1 refill only.

## 2017-07-17 NOTE — Progress Notes (Signed)
[facility] [place], [place] [address]  Internal MEDICINE  Office Visit Note  Patient Name: Pamela Romero  [id number]  [id number]  Date of Service: 07/17/2017  Chief Complaint  Patient presents with  . Knee Pain    follow up  . Back Pain    upper back  . Hypothyroidism    The patient is here for routine follow up exam. She is having severe pain in right foot. Long history of plantar fasciitis. Has seen podiatry/orthopedics for this in the past. Does take tramadol on as needed basis, but does not seem to be working at this current dose. Pain keeps coming back while she is still working. She is also having a lot of pain in left knee. Was to have x-ray of left knee, but would like to see her orthopedic provider instead.  The patient did have lab work done today and is here to discuss results.   Knee Pain    Back Pain  Associated symptoms include headaches. Pertinent negatives include no abdominal pain or chest pain.    Pt is here for routine follow up.    Current Medication: Outpatient Encounter Medications as of 07/17/2017  Medication Sig Note  . diclofenac (VOLTAREN) 50 MG EC tablet Take 1 tablet (50 mg total) by mouth 2 (two) times daily.   [name] docusate sodium (COLACE) 100 MG capsule Take 100 mg by mouth 2 (two) times daily as needed.   . DULoxetine (CYMBALTA) 20 MG capsule Take 20 mg by mouth daily.   [name] levothyroxine (SYNTHROID, LEVOTHROID) 150 MCG tablet Take 2 tablets (300 mcg total) by mouth daily.   [name] losartan-hydrochlorothiazide (HYZAAR) 50-12.5 MG tablet Take 1 tablet by mouth daily.   . ranitidine (ZANTAC) 150 MG capsule Take by mouth.   . rosuvastatin (CRESTOR) 5 MG tablet Take 5 mg by mouth at bedtime.   . traMADol (ULTRAM) 50 MG tablet Take 2 tablets (100 mg total) by mouth every 6 (six) hours as needed.   . [DISCONTINUED] etodolac (LODINE) 400 MG tablet Take by mouth. 07/17/2017: patient taking diclofenac.  . [DISCONTINUED] levothyroxine  (SYNTHROID, LEVOTHROID) 125 MCG tablet Take 2 tablets (250 mcg total) by mouth daily.   . [DISCONTINUED] losartan-hydrochlorothiazide (HYZAAR) 50-12.5 MG tablet Take 1 tablet by mouth daily.   . [DISCONTINUED] traMADol (ULTRAM) 50 MG tablet Take 2 tablets (100 mg total) by mouth every 6 (six) hours as needed.   [name] atorvastatin (LIPITOR) 20 MG tablet Take 1 tablet (20 mg total) by mouth daily.   [name] esomeprazole (NEXIUM) 40 MG capsule Take 1 capsule (40 mg total) by mouth daily.   . methylPREDNISolone (MEDROL) 4 MG TBPK tablet Take by mouth as directed for 6 days    No facility-administered encounter medications on file as of 07/17/2017.     Surgical History: Past Surgical History:  Procedure Laterality Date  . BREAST CYST ASPIRATION Right    neg  . BREAST SURGERY Right March 2014   benign Byrnett  . CARDIAC CATHETERIZATION  2006   40% LAD, EF 60%  . THYROIDECTOMY  2005   partial at [name], non malignant tumor  . TOTAL ABDOMINAL HYSTERECTOMY     with BSO    Medical History: Past Medical History:  Diagnosis Date  . Abnormal EKG   . CAD (coronary artery disease)    non critical, by cardiac cath 2006  . Cancer ([place])    endometrial Ca  . Endometrial adenocarcinoma ([place]) 2011  . Hyperlipidemia   .  Hypertension   . Osteoarthritis   . Reflux gastritis   . Thyroid disease   . tobacco abuse   . Vaginal delivery    x 2    Family History: Family History  Problem Relation Age of Onset  . Alcohol abuse Father   . Cancer Maternal Aunt        stomach  . Alcohol abuse Maternal Aunt   . Alcohol abuse Mother   . Heart disease Brother        MI age 57- non smoker  . Cancer Brother        colon, dx'd age 93  . Cancer Brother        stomach, paternal half brother  . Breast cancer Neg Hx     Social History   Socioeconomic History  . Marital status: Married    Spouse name: Not on file  . Number of children: 0  . Years of education: Not on file  . Highest education level: Not on  file  Occupational History  . Occupation: Public affairs consultant- full time  . Occupation: Administrator, arts- part time  Social Needs  . Financial resource strain: Not on file  . Food insecurity:    Worry: Not on file    Inability: Not on file  . Transportation needs:    Medical: Not on file    Non-medical: Not on file  Tobacco Use  . Smoking status: Current Every Day Smoker  . Smokeless tobacco: Never Used  . Tobacco comment: 2-3 cigs a day  Substance and Sexual Activity  . Alcohol use: No  . Drug use: No  . Sexual activity: Not on file  Lifestyle  . Physical activity:    Days per week: Not on file    Minutes per session: Not on file  . Stress: Not on file  Relationships  . Social connections:    Talks on phone: Not on file    Gets together: Not on file    Attends religious service: Not on file    Active member of club or organization: Not on file    Attends meetings of clubs or organizations: Not on file    Relationship status: Not on file  . Intimate partner violence:    Fear of current or ex partner: Not on file    Emotionally abused: Not on file    Physically abused: Not on file    Forced sexual activity: Not on file  Other Topics Concern  . Not on file  Social History Narrative   Lives with spouse, takes care of her mother.   Had 2 children, one died at birth, the other given up for adoption (was unmarried).      Review of Systems  Constitutional: Positive for activity change and fatigue.  HENT: Positive for ear pain and hearing loss.        Ringing in the ears.   Eyes: Negative.   Respiratory: Negative for cough and wheezing.   Cardiovascular: Negative for chest pain and palpitations.       Blood pressure improved.   Gastrointestinal: Negative.  Negative for abdominal pain, diarrhea, nausea and vomiting.  Endocrine:       Thyroid very hypoactive.   Genitourinary: Negative.   Musculoskeletal: Positive for arthralgias, back pain and myalgias.       Left knee and right  foot. Pain getting worse and more difficult to keep active.   Skin: Negative.   Allergic/Immunologic: Negative.   Neurological: Positive for headaches.  Hematological: Negative.  Negative for adenopathy.  Psychiatric/Behavioral: The patient is nervous/anxious.    Today's Vitals   07/17/17 0850  BP: 120/83  Pulse: 91  Resp: 16  SpO2: 95%  Weight: 219 lb (99.3 kg)  Height: 5' 8.5" (1.74 m)    Physical Exam  Constitutional: She is oriented to person, place, and time. She appears well-developed and well-nourished.  HENT:  Head: Normocephalic and atraumatic.  Right Ear: External ear normal.  Left Ear: External ear normal.  Eyes: Pupils are equal, round, and reactive to light.  Neck: Normal range of motion. Neck supple. No thyromegaly present.  Cardiovascular: Normal rate, regular rhythm and normal heart sounds.  Pulmonary/Chest: Effort normal and breath sounds normal. She has no wheezes. She exhibits no tenderness.  Abdominal: Soft. Bowel sounds are normal. There is no tenderness.  Musculoskeletal:       Legs:      Feet:  Lymphadenopathy:    She has no cervical adenopathy.  Neurological: She is alert and oriented to person, place, and time.  Skin: Skin is warm and dry.  Psychiatric: She has a normal mood and affect.  Nursing note and vitals reviewed.   Assessment/Plan: 1. Hypothyroidism, unspecified type Very underactive thyroid. Increase levothyroxine to 3105mcg daily. Recheck thyroid panel prior to next visit and adjust again and refer to endocrinologist as indicated.  - levothyroxine (SYNTHROID, LEVOTHROID) 150 MCG tablet; Take 2 tablets (300 mcg total) by mouth daily.  Dispense: 60 tablet; Refill: 3  2. Acute pain of left knee Diclofenac 50mg  twice daily to reduce pain and inflammation. tramdol as needed and as prescribed. New rx provided to pharmacy today. Refer to orthopedics for further evaluation.  - Ambulatory referral to Orthopedic Surgery  3. Plantar fascia  syndrome Referral orthopedics. Pain medications as needed and as prescribed  - traMADol (ULTRAM) 50 MG tablet; Take 2 tablets (100 mg total) by mouth every 6 (six) hours as needed.  Dispense: 180 tablet; Refill: 2 - methylPREDNISolone (MEDROL) 4 MG TBPK tablet; Take by mouth as directed for 6 days  Dispense: 21 tablet; Refill: 0  4. Essential hypertension Improved. contineu bp medication as prescribed  - losartan-hydrochlorothiazide (HYZAAR) 50-12.5 MG tablet; Take 1 tablet by mouth daily.  Dispense: 90 tablet; Refill: 1  5. Inflammatory polyarthropathy (HCC) Diclofenac 50mg  twice daily as needed for pain/inflammation. Tramadol 100,g as needed and as prescribed. Refer t orthopedics for further evaluation and treatment .   General Counseling: Pamela Romero verbalizes understanding of the findings of todays visit and agrees with plan of treatment. I have discussed any further diagnostic evaluation that may be needed or ordered today. We also reviewed her medications today. she has been encouraged to call the office with any questions or concerns that should arise related to todays visit.  This patient was seen by Leretha Pol, FNP- C in Collaboration with Dr Lavera Guise as a part of collaborative care agreement    Orders Placed This Encounter  Procedures  . Ambulatory referral to Orthopedic Surgery    Meds ordered this encounter  Medications  . levothyroxine (SYNTHROID, LEVOTHROID) 150 MCG tablet    Sig: Take 2 tablets (300 mcg total) by mouth daily.    Dispense:  60 tablet    Refill:  3    Please note increased dose    Order Specific Question:   Supervising Provider    Answer:   Lavera Guise [[id number]]  . losartan-hydrochlorothiazide (HYZAAR) 50-12.5 MG tablet    Sig: Take 1 tablet  by mouth daily.    Dispense:  90 tablet    Refill:  1    Order Specific Question:   Supervising Provider    Answer:   Lavera Guise [[id number]]  . traMADol (ULTRAM) 50 MG tablet    Sig: Take 2 tablets (100 mg  total) by mouth every 6 (six) hours as needed.    Dispense:  180 tablet    Refill:  2    This request is for a new prescription for a controlled substance as required by Federal/State law..    Order Specific Question:   Supervising Provider    Answer:   Lavera Guise [[id number]]  . methylPREDNISolone (MEDROL) 4 MG TBPK tablet    Sig: Take by mouth as directed for 6 days    Dispense:  21 tablet    Refill:  0    Order Specific Question:   Supervising Provider    Answer:   Lavera Guise [[id number]]    Time spent: 63 Minutes        Dr Lavera Guise Internal medicine

## 2017-07-19 DIAGNOSIS — M545 Low back pain, unspecified: Secondary | ICD-10-CM | POA: Insufficient documentation

## 2017-08-14 ENCOUNTER — Ambulatory Visit: Payer: Self-pay | Admitting: Nurse Practitioner

## 2017-08-18 ENCOUNTER — Other Ambulatory Visit: Payer: Self-pay | Admitting: Surgery

## 2017-08-18 DIAGNOSIS — M25562 Pain in left knee: Secondary | ICD-10-CM

## 2017-08-24 ENCOUNTER — Telehealth: Payer: Self-pay

## 2017-08-24 DIAGNOSIS — I10 Essential (primary) hypertension: Secondary | ICD-10-CM

## 2017-08-24 MED ORDER — LISINOPRIL-HYDROCHLOROTHIAZIDE 20-12.5 MG PO TABS: 1.0000 | ORAL_TABLET | Freq: Every day | ORAL | 3 refills | Status: DC

## 2017-08-24 NOTE — Telephone Encounter (Signed)
I tried to call but there is not vm on cell or house. I will call back later

## 2017-08-28 NOTE — Telephone Encounter (Signed)
Got in touch with husband Friday at 4. He was notified of change.

## 2017-08-29 ENCOUNTER — Ambulatory Visit
Payer: BLUE CROSS/BLUE SHIELD | Attending: Student in an Organized Health Care Education/Training Program | Admitting: Student in an Organized Health Care Education/Training Program

## 2017-08-29 ENCOUNTER — Other Ambulatory Visit: Payer: Self-pay

## 2017-08-29 ENCOUNTER — Encounter: Payer: Self-pay | Admitting: Student in an Organized Health Care Education/Training Program

## 2017-08-29 VITALS — BP 132/103 | HR 94 | Temp 98.8°F | Resp 18 | Ht 68.0 in | Wt 212.0 lb

## 2017-08-29 DIAGNOSIS — F419 Anxiety disorder, unspecified: Secondary | ICD-10-CM | POA: Insufficient documentation

## 2017-08-29 DIAGNOSIS — I2510 Atherosclerotic heart disease of native coronary artery without angina pectoris: Secondary | ICD-10-CM | POA: Insufficient documentation

## 2017-08-29 DIAGNOSIS — Z79899 Other long term (current) drug therapy: Secondary | ICD-10-CM

## 2017-08-29 DIAGNOSIS — Z8542 Personal history of malignant neoplasm of other parts of uterus: Secondary | ICD-10-CM | POA: Insufficient documentation

## 2017-08-29 DIAGNOSIS — I129 Hypertensive chronic kidney disease with stage 1 through stage 4 chronic kidney disease, or unspecified chronic kidney disease: Secondary | ICD-10-CM | POA: Insufficient documentation

## 2017-08-29 DIAGNOSIS — G894 Chronic pain syndrome: Secondary | ICD-10-CM | POA: Diagnosis not present

## 2017-08-29 DIAGNOSIS — R0602 Shortness of breath: Secondary | ICD-10-CM | POA: Insufficient documentation

## 2017-08-29 DIAGNOSIS — Z8249 Family history of ischemic heart disease and other diseases of the circulatory system: Secondary | ICD-10-CM | POA: Insufficient documentation

## 2017-08-29 DIAGNOSIS — Z809 Family history of malignant neoplasm, unspecified: Secondary | ICD-10-CM | POA: Diagnosis not present

## 2017-08-29 DIAGNOSIS — K219 Gastro-esophageal reflux disease without esophagitis: Secondary | ICD-10-CM | POA: Insufficient documentation

## 2017-08-29 DIAGNOSIS — F17200 Nicotine dependence, unspecified, uncomplicated: Secondary | ICD-10-CM | POA: Diagnosis not present

## 2017-08-29 DIAGNOSIS — N189 Chronic kidney disease, unspecified: Secondary | ICD-10-CM | POA: Diagnosis not present

## 2017-08-29 DIAGNOSIS — E039 Hypothyroidism, unspecified: Secondary | ICD-10-CM | POA: Diagnosis not present

## 2017-08-29 DIAGNOSIS — Z6830 Body mass index (BMI) 30.0-30.9, adult: Secondary | ICD-10-CM | POA: Diagnosis not present

## 2017-08-29 DIAGNOSIS — R1310 Dysphagia, unspecified: Secondary | ICD-10-CM | POA: Insufficient documentation

## 2017-08-29 DIAGNOSIS — E785 Hyperlipidemia, unspecified: Secondary | ICD-10-CM | POA: Diagnosis not present

## 2017-08-29 DIAGNOSIS — Z811 Family history of alcohol abuse and dependence: Secondary | ICD-10-CM | POA: Diagnosis not present

## 2017-08-29 DIAGNOSIS — M722 Plantar fascial fibromatosis: Secondary | ICD-10-CM | POA: Insufficient documentation

## 2017-08-29 DIAGNOSIS — M2141 Flat foot [pes planus] (acquired), right foot: Secondary | ICD-10-CM | POA: Insufficient documentation

## 2017-08-29 DIAGNOSIS — E669 Obesity, unspecified: Secondary | ICD-10-CM | POA: Diagnosis not present

## 2017-08-29 DIAGNOSIS — M25562 Pain in left knee: Secondary | ICD-10-CM | POA: Insufficient documentation

## 2017-08-29 DIAGNOSIS — L03116 Cellulitis of left lower limb: Secondary | ICD-10-CM | POA: Diagnosis not present

## 2017-08-29 DIAGNOSIS — Z9889 Other specified postprocedural states: Secondary | ICD-10-CM | POA: Insufficient documentation

## 2017-08-29 DIAGNOSIS — M19071 Primary osteoarthritis, right ankle and foot: Secondary | ICD-10-CM | POA: Insufficient documentation

## 2017-08-29 DIAGNOSIS — K2970 Gastritis, unspecified, without bleeding: Secondary | ICD-10-CM | POA: Insufficient documentation

## 2017-08-29 DIAGNOSIS — Z79891 Long term (current) use of opiate analgesic: Secondary | ICD-10-CM | POA: Insufficient documentation

## 2017-08-29 DIAGNOSIS — Z90710 Acquired absence of both cervix and uterus: Secondary | ICD-10-CM | POA: Insufficient documentation

## 2017-08-29 MED ORDER — DICLOFENAC SODIUM 75 MG PO TBEC: 75.0000 mg | DELAYED_RELEASE_TABLET | Freq: Two times a day (BID) | ORAL | 1 refills | Status: DC

## 2017-08-29 NOTE — Progress Notes (Deleted)
Patient's Name: Pamela Romero  MRN: [id number]  Referring Provider: Ronnell Freshwater, NP  DOB: 06-17-1950  PCP: Ronnell Freshwater, NP  DOS: 08/29/2017  Note by: Gillis Santa, MD  Service setting: Ambulatory outpatient  Specialty: Interventional Pain Management  Location: [facility] (AMB) Pain Management Facility  Visit type: Initial Patient Evaluation  Patient type: New Patient   Primary Reason(s) for Visit: Encounter for initial evaluation of one or more chronic problems (new to examiner) potentially causing chronic pain, and posing a threat to normal musculoskeletal function. (Level of risk: High) CC: Foot Pain (right); Ankle Pain (right); and Knee Pain (left)  HPI  Pamela Romero is a 67 y.o. year old, female patient, who comes today to see [place] for the first time for an initial evaluation of her chronic pain. 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; Hyperlipidemia LDL goal <100; Transient vision disturbance, bilateral; Unspecified constipation; Cough; Dysphagia; Obesity, unspecified; Annual physical exam; S/P breast biopsy; Pes planus of both feet; Shortness of breath; Cellulitis of left lower limb; Edema, unspecified; Inflammatory polyarthropathy ([place]); Major depressive disorder, recurrent, mild ([place]); Chronic kidney disease, unspecified; Anxiety disorder, unspecified; Nicotine dependence, cigarettes, uncomplicated; Acute pain of left knee; Plantar fascia syndrome; and Acute left-sided low back pain without sciatica on their problem list. Today she comes in for evaluation of her Foot Pain (right); Ankle Pain (right); and Knee Pain (left)  Pain Assessment: Location: Right Foot Radiating: radiates from foot to ankle Onset: More than a month ago Duration: Chronic pain Quality: Stabbing, Tingling, Cramping Severity: 8 /10 (subjective, self-reported pain score)   Note: {Blank single:19197::"Reported level is inconsistent with clinical observations.","Clear symptom exaggeration. Reported level of pain is not compatible with clinical observations.","Reported level is compatible with observation."} {Blank single:19197::"Clinically the patient looks like a","     "} {Blank single:19197::"0/10","1/10","2/10","3/10","4/10","5/10","     "} {Blank single:19197::"A 1/10 is viewed as "Mild" and described as nagging, annoying, but not interfering with basic activities of daily living (ADL). Pamela Romero is able to eat, bathe, get dressed, do toileting (being able to get on and off the toilet and perform personal hygiene functions), transfer (move in and out of bed or a chair without assistance), and maintain continence (able to control bladder and bowel functions). Physiologic parameters such as blood pressure and heart rate apear wnl.","A 2/10 is viewed as "Mild to Moderate" and described as noticeable and distracting. Impossible to hide from other people. More frequent flare-ups. Still possible to adapt and function close to normal. It can be very annoying and may have occasional stronger flare-ups. With discipline, patients may get used to it and adapt.","A 3/10 is viewed as "Moderate" and described as significantly interfering with activities of daily living (ADL). It becomes difficult to feed, bathe, get dressed, get on and off the toilet or to perform personal hygiene functions. Difficult to get in and out of bed or a chair without assistance. Very distracting. With effort, it can be ignored when deeply involved in activities.","A 4/10 is viewed as "Moderately Severe" and described as impossible to ignore for more than a few minutes. With effort, patients may still be able to manage work or participate in some social activities. Very difficult to concentrate. Signs of autonomic nervous system discharge are evident: dilated pupils (mydriasis); mild sweating (diaphoresis); sleep  interference. Heart rate becomes elevated (>115 bpm). Diastolic blood pressure (lower number) rises above 100 mmHg. Patients find relief in laying down  and not moving.","A 5/10 is viewed as "Severe" and described as intense and extremely unpleasant. Associated with frowning face and frequent crying. Pain overwhelms the senses.  Ability to do any activity or maintain social relationships becomes significantly limited. Conversation becomes difficult. Pacing back and forth is common, as getting into a comfortable position is nearly impossible. Pain wakes you up from deep sleep. Physical signs will be obvious: pupillary dilation; increased sweating; goosebumps; brisk reflexes; cold, clammy hands and feet; nausea, vomiting or dry heaves; loss of appetite; significant sleep disturbance with inability to fall asleep or to remain asleep. When persistent, significant weight loss is observed due to the complete loss of appetite and sleep deprivation.  Blood pressure and heart rate becomes significantly elevated.","     "} {Blank single:19197::"Information on the proper use of the pain scale provided to the patient today.","Discrepancy may suggest a "suffering" (psychosocial) component.","Discrepancy may suggest symptom exaggeration.","Pamela Romero does not seem to understand the use of our objective pain scale","Pamela Romero continues to use a standard subjective pain scale, rather than an objective pain scale as instructed.","     "} {Blank single:19197::"     ","When using our objective Pain Scale, levels between 6 and 10/10 are said to belong in an emergency room, as it progressively worsens from a 6/10, described as severely limiting, requiring emergency care not usually available at an outpatient pain management facility. At a 6/10 level, communication becomes difficult and requires great effort. Assistance to reach the emergency department may be required. Facial flushing and profuse sweating along with potentially  dangerous increases in heart rate and blood pressure will be evident."} Effect on ADL:   Timing: Constant Modifying factors: sitting with ice on foot BP: (!) 132/103  HR: 94  Onset and Duration: {Hx; Onset and Duration:210120511} Cause of pain: {Hx; Cause:210120521} Severity: {Pain Severity:210120502} Timing: {Symptoms; Timing:210120501} Aggravating Factors: {Causes; Aggravating pain factors:210120507} Alleviating Factors: {Causes; Alleviating Factors:210120500} Associated Problems: {Hx; Associated problems:210120515} Quality of Pain: {Hx; Symptom quality or Descriptor:210120531} Previous Examinations or Tests: {Hx; Previous examinations or test:210120529} Previous Treatments: {Hx; Previous Treatment:210120503}  The patient comes into the clinics today for the first time for a chronic pain management evaluation. ***  Today I took the time to provide the patient with information regarding my pain practice. The patient was informed that my practice is divided into two sections: an interventional pain management section, as well as a completely separate and distinct medication management section. I explained that I have procedure days for my interventional therapies, and evaluation days for follow-ups and medication management. Because of the amount of documentation required during both, they are kept separated. This means that there is the possibility that she may be scheduled for a procedure on one day, and medication management the next. I have also informed her that because of staffing and facility limitations, I no longer take patients for medication management only. To illustrate the reasons for this, I gave the patient the example of surgeons, and how inappropriate it would be to refer a patient to his/her care, just to write for the post-surgical antibiotics on a surgery done by a different surgeon.   Because interventional pain management is my board-certified specialty, the patient was  informed that joining my practice means that they are open to any and all interventional therapies. I made it clear that this does not mean that they will be forced to have any procedures done. What this means is that I believe interventional therapies to be essential part  of the diagnosis and proper management of chronic pain conditions. Therefore, patients not interested in these interventional alternatives will be better served under the care of a different practitioner.  The patient was also made aware of my Comprehensive Pain Management Safety Guidelines where by joining my practice, they limit all of their nerve blocks and joint injections to those done by our practice, for as long as we are retained to manage their care.   Historic Controlled Substance Pharmacotherapy Review  PMP and historical list of controlled substances: ***  Highest opioid analgesic regimen found: ***  Most recent opioid analgesic: ***  Current opioid analgesics: ***  Highest recorded MME/day: *** mg/day MME/day: *** mg/day Medications: {Blank single:19197::"Patient brought medications to be checked, as requested","The patient did not bring the medication(s) to the appointment, as requested in our "New Patient Package""} Pharmacodynamics: Desired effects: Analgesia: The patient reports {Blank single:19197::"no","<50%","50%",">50%"} benefit. Reported improvement in function: The patient reports {Blank single:19197::"being unable to accomplish basic ADLs.","medication allows her to have a normal productive life, including accomplishing all ADLs.","medication allows her to accomplish basic ADLs."} Clinically meaningful improvement in function (CMIF): {Blank single:19197::"Medication does not meet basic CMIF","Sustained CMIF goals met"} Perceived effectiveness: {Blank single:19197::"None","Described as ineffective and would like to make some changes","Described as relatively effective but with some room for  improvement","Described as relatively effective, allowing for increase in activities of daily living (ADL)"} Undesirable effects: Side-effects or Adverse reactions: {Blank single:19197::"Minimal","Moderate","Significant","Constipation","Oversedation. This could suggest a drug interaction or accumulation","None reported"} Historical Monitoring: The patient  reports that she does not use drugs. List of all UDS Test(s): No results found for: MDMA, COCAINSCRNUR, PCPSCRNUR, [place], CANNABQUANT, [place], [place] List of other Serum/Urine Drug Screening Test(s):  No results found for: AMPHSCRSER, BARBSCRSER, BENZOSCRSER, COCAINSCRSER, COCAINSCRNUR, PCPSCRSER, PCPQUANT, THCSCRSER, THCU, CANNABQUANT, OPIATESCRSER, OXYSCRSER, PROPOXSCRSER, ETH Historical Background Evaluation: [place] PMP: {Blank single:19197::"   ","Unable to conduct review of the controlled substance reporting system due to technological failure.","Online review of the past 100-monthperiod conducted.","Six (6) year initial data search conducted."} {Blank single:19197::"Abnormal pattern detected.","Regular, uninterrupted pattern of monthly opioid refills detected.","Discrepancies found between information provided by the patient and the database.","No abnormal patterns identified.","     "} {Blank single:19197::"Pattern of multiple prescribers found","Pattern of multiple pharmacies found","A pattern of multiple prescribers and multiple pharmacies was identified","     "} PMP NARX Score Report:  Narcotic: *** Sedative: *** Stimulant: *** [place] Department of public safety, offender search: (Editor, commissioningInformation) {Blank single:19197::"Positive for","No criminal record(s) found","Non-contributory"} Risk Assessment Profile: Aberrant behavior: {Aberrant Behavior:210120800::"None observed or detected today"} Risk factors for fatal opioid overdose: {Risk Factors:210120801::"None identified today"} PMP NARX Overdose Risk Score: *** Fatal overdose hazard ratio  (HR): {Blank single:19197::"1.32 for 20-49 MME/day","1.92 for 50-99 MME/day","2.04 for doses equal to, or higher than 100 MME/day","Calculation deferred"} Non-fatal overdose hazard ratio (HR): {Blank single:19197::"1.44 for 20-49 MME/day","3.73 for 50-99 MME/day","8.87 for 100-199 MME/day","2.88 for doses equal to, or higher than 200 MME/day","Calculation deferred"} Risk of opioid abuse or dependence: 0.7-3.0% with doses ? 36 MME/day and 6.1-26% with doses ? 120 MME/day. Substance use disorder (SUD) risk level: {Blank single:19197::"High","Moderate","Low","Pending results of Medical Psychology Evaluation for SUD"} Opioid risk tool (ORT) (Total Score): 2 Opioid Risk Tool - 08/29/17 0918      Family History of Substance Abuse   Alcohol  Positive Female    Illegal Drugs  Negative    Rx Drugs  Negative      Personal History of Substance Abuse   Alcohol  Negative    Illegal Drugs  Negative  Rx Drugs  Negative      Age   Age between 11-45 years   No      History of Preadolescent Sexual Abuse   History of Preadolescent Sexual Abuse  Negative or Female      Psychological Disease   Psychological Disease  Negative    Depression  Positive      Total Score   Opioid Risk Tool Scoring  2    Opioid Risk Interpretation  Low Risk      ORT Scoring interpretation table:  Score <3 = Low Risk for SUD  Score between 4-7 = Moderate Risk for SUD  Score >8 = High Risk for Opioid Abuse   PHQ-2 Depression Scale:  Total score: 0  PHQ-2 Scoring interpretation table: (Score and probability of major depressive disorder)  Score 0 = No depression  Score 1 = 15.4% Probability  Score 2 = 21.1% Probability  Score 3 = 38.4% Probability  Score 4 = 45.5% Probability  Score 5 = 56.4% Probability  Score 6 = 78.6% Probability   PHQ-9 Depression Scale:  Total score: 0  PHQ-9 Scoring interpretation table:  Score 0-4 = No depression  Score 5-9 = Mild depression  Score 10-14 = Moderate depression  Score  15-19 = Moderately severe depression  Score 20-27 = Severe depression (2.4 times higher risk of SUD and 2.89 times higher risk of overuse)   Pharmacologic Plan: {Blank single:19197::"None at this point.","Non-opioid analgesic therapy offered.","Further information needed.","No further testing ordered.","Adjust therapy: ***","Discontinue opioid analgesic therapy.","Continue therapy as is.","No opioid analgesics.","Ms. Nicoletti did not have a "Drug Screening Test" done today.","As per protocol, I have not taken over any controlled substance management, pending the results of ordered tests and/or consults."} {Blank single:19197::"She declined/refused having a "Drug Screening test" done today.","She left without providing [place] with a sample for the "Drug Screening Test".","Ms. Arentz states being against the use of "opioid analgesics", as part of her treatment plan.","Ms. Gade has expressed her preference to stay away from controlled substances.","Ms. Laduke has indicated not being interested in our services, at this time.","          "} Initial impression: {Blank single:19197::"N/A","The patient indicated having no interest, at this time.","Poor candidate for opioid analgesics.","High risk for opiate therapy.","No immediate contraindications found.","Pending review of available data and ordered tests."}  Meds   Current Outpatient Medications:  .  docusate sodium (COLACE) 100 MG capsule, Take 100 mg by mouth 2 (two) times daily as needed., Disp: , Rfl:  .  esomeprazole (NEXIUM) 40 MG capsule, Take 1 capsule (40 mg total) by mouth daily., Disp: 30 capsule, Rfl: 3 .  levothyroxine (SYNTHROID, LEVOTHROID) 150 MCG tablet, Take 2 tablets (300 mcg total) by mouth daily., Disp: 60 tablet, Rfl: 3 .  lisinopril-hydrochlorothiazide (ZESTORETIC) 20-12.5 MG tablet, Take 1 tablet by mouth daily., Disp: 30 tablet, Rfl: 3 .  traMADol (ULTRAM) 50 MG tablet, Take 2 tablets (100 mg total) by mouth every 6 (six) hours  as needed., Disp: 120 tablet, Rfl: 1 .  diclofenac (VOLTAREN) 75 MG EC tablet, Take 1 tablet (75 mg total) by mouth 2 (two) times daily., Disp: 60 tablet, Rfl: 1 .  DULoxetine (CYMBALTA) 20 MG capsule, Take 20 mg by mouth daily., Disp: , Rfl:  .  ranitidine (ZANTAC) 150 MG capsule, Take by mouth., Disp: , Rfl:  .  rosuvastatin (CRESTOR) 5 MG tablet, Take 5 mg by mouth at bedtime., Disp: , Rfl:   Imaging Review  Cervical Imaging: Cervical MR wo contrast: No  results found for this or any previous visit. Cervical MR wo contrast: No results found for this or any previous visit. Cervical MR w/wo contrast: No results found for this or any previous visit. Cervical MR w contrast: No results found for this or any previous visit. Cervical CT wo contrast: No results found for this or any previous visit. Cervical CT w/wo contrast: No results found for this or any previous visit. Cervical CT w/wo contrast: No results found for this or any previous visit. Cervical CT w contrast: No results found for this or any previous visit. Cervical CT outside: No results found for this or any previous visit. Cervical DG 1 view: No results found for this or any previous visit. Cervical DG 2-3 views: No results found for this or any previous visit. Cervical DG F/E views: No results found for this or any previous visit. Cervical DG 2-3 clearing views: No results found for this or any previous visit. Cervical DG Bending/F/E views: No results found for this or any previous visit. Cervical DG complete: No results found for this or any previous visit. Cervical DG Myelogram views: No results found for this or any previous visit. Cervical DG Myelogram views: No results found for this or any previous visit. Cervical Discogram views: No results found for this or any previous visit.  Shoulder Imaging: Shoulder-R MR w contrast: No results found for this or any previous visit. Shoulder-L MR w contrast: No results found for this  or any previous visit. Shoulder-R MR w/wo contrast: No results found for this or any previous visit. Shoulder-L MR w/wo contrast: No results found for this or any previous visit. Shoulder-R MR wo contrast: No results found for this or any previous visit. Shoulder-L MR wo contrast: No results found for this or any previous visit. Shoulder-R CT w contrast: No results found for this or any previous visit. Shoulder-L CT w contrast: No results found for this or any previous visit. Shoulder-R CT w/wo contrast: No results found for this or any previous visit. Shoulder-L CT w/wo contrast: No results found for this or any previous visit. Shoulder-R CT wo contrast: No results found for this or any previous visit. Shoulder-L CT wo contrast: No results found for this or any previous visit. Shoulder-R DG Arthrogram: No results found for this or any previous visit. Shoulder-L DG Arthrogram: No results found for this or any previous visit. Shoulder-R DG 1 view: No results found for this or any previous visit. Shoulder-L DG 1 view: No results found for this or any previous visit. Shoulder-R DG: No results found for this or any previous visit. Shoulder-L DG: No results found for this or any previous visit.  Thoracic Imaging: Thoracic MR wo contrast: No results found for this or any previous visit. Thoracic MR wo contrast: No results found for this or any previous visit. Thoracic MR w/wo contrast: No results found for this or any previous visit. Thoracic MR w contrast: No results found for this or any previous visit. Thoracic CT wo contrast: No results found for this or any previous visit. Thoracic CT w/wo contrast: No results found for this or any previous visit. Thoracic CT w/wo contrast: No results found for this or any previous visit. Thoracic CT w contrast: No results found for this or any previous visit. Thoracic DG 2-3 views: No results found for this or any previous visit. Thoracic DG 4 views: No  results found for this or any previous visit. Thoracic DG: No results found for this  or any previous visit. Thoracic DG w/swimmers view: No results found for this or any previous visit. Thoracic DG Myelogram views: No results found for this or any previous visit. Thoracic DG Myelogram views: No results found for this or any previous visit.  Lumbosacral Imaging: Lumbar MR wo contrast: No results found for this or any previous visit. Lumbar MR wo contrast: No results found for this or any previous visit. Lumbar MR w/wo contrast: No results found for this or any previous visit. Lumbar MR w/wo contrast: No results found for this or any previous visit. Lumbar MR w contrast: No results found for this or any previous visit. Lumbar CT wo contrast: No results found for this or any previous visit. Lumbar CT w/wo contrast: No results found for this or any previous visit. Lumbar CT w/wo contrast: No results found for this or any previous visit. Lumbar CT w contrast: No results found for this or any previous visit. Lumbar DG 1V: No results found for this or any previous visit. Lumbar DG 1V (Clearing): No results found for this or any previous visit. Lumbar DG 2-3V (Clearing): No results found for this or any previous visit. Lumbar DG 2-3 views: No results found for this or any previous visit. Lumbar DG (Complete) 4+V: No results found for this or any previous visit. Lumbar DG F/E views: No results found for this or any previous visit. Lumbar DG Bending views: No results found for this or any previous visit. Lumbar DG Myelogram views: No results found for this or any previous visit. Lumbar DG Myelogram: No results found for this or any previous visit. Lumbar DG Myelogram: No results found for this or any previous visit. Lumbar DG Myelogram: No results found for this or any previous visit. Lumbar DG Myelogram Lumbosacral: No results found for this or any previous visit. Lumbar DG Diskogram views: No  results found for this or any previous visit. Lumbar DG Diskogram views: No results found for this or any previous visit. Lumbar DG Epidurogram OP: No results found for this or any previous visit. Lumbar DG Epidurogram IP: No results found for this or any previous visit.  Sacroiliac Joint Imaging: Sacroiliac Joint DG: No results found for this or any previous visit. Sacroiliac Joint MR w/wo contrast: No results found for this or any previous visit. Sacroiliac Joint MR wo contrast: No results found for this or any previous visit.  Spine Imaging: Whole Spine DG Myelogram views: No results found for this or any previous visit. Whole Spine MR Mets screen: No results found for this or any previous visit. Whole Spine MR Mets screen: No results found for this or any previous visit. Whole Spine MR w/wo: No results found for this or any previous visit. MRA Spinal Canal w/ cm: No results found for this or any previous visit. MRA Spinal Canal wo/ cm: No results found for this or any previous visit. MRA Spinal Canal w/wo cm: No results found for this or any previous visit. Spine Outside MR Films: No results found for this or any previous visit. Spine Outside CT Films: No results found for this or any previous visit. CT-Guided Biopsy: No results found for this or any previous visit. CT-Guided Needle Placement: No results found for this or any previous visit. DG Spine outside: No results found for this or any previous visit. IR Spine outside: No results found for this or any previous visit. NM Spine outside: No results found for this or any previous visit.  Hip Imaging:  Hip-R MR w contrast: No results found for this or any previous visit. Hip-L MR w contrast: No results found for this or any previous visit. Hip-R MR w/wo contrast: No results found for this or any previous visit. Hip-L MR w/wo contrast: No results found for this or any previous visit. Hip-R MR wo contrast: No results found for this  or any previous visit. Hip-L MR wo contrast: No results found for this or any previous visit. Hip-R CT w contrast: No results found for this or any previous visit. Hip-L CT w contrast: No results found for this or any previous visit. Hip-R CT w/wo contrast: No results found for this or any previous visit. Hip-L CT w/wo contrast: No results found for this or any previous visit. Hip-R CT wo contrast: No results found for this or any previous visit. Hip-L CT wo contrast: No results found for this or any previous visit. Hip-R DG 2-3 views: No results found for this or any previous visit. Hip-L DG 2-3 views: No results found for this or any previous visit. Hip-R DG Arthrogram: No results found for this or any previous visit. Hip-L DG Arthrogram: No results found for this or any previous visit. Hip-B DG Bilateral: No results found for this or any previous visit.  Knee Imaging: Knee-R MR w contrast: No results found for this or any previous visit. Knee-L MR w contrast: No results found for this or any previous visit. Knee-R MR w/wo contrast: No results found for this or any previous visit. Knee-L MR w/wo contrast: No results found for this or any previous visit. Knee-R MR wo contrast: No results found for this or any previous visit. Knee-L MR wo contrast: No results found for this or any previous visit. Knee-R CT w contrast: No results found for this or any previous visit. Knee-L CT w contrast: No results found for this or any previous visit. Knee-R CT w/wo contrast: No results found for this or any previous visit. Knee-L CT w/wo contrast: No results found for this or any previous visit. Knee-R CT wo contrast: No results found for this or any previous visit. Knee-L CT wo contrast: No results found for this or any previous visit. Knee-R DG 1-2 views: No results found for this or any previous visit. Knee-L DG 1-2 views: No results found for this or any previous visit. Knee-R DG 3 views: No  results found for this or any previous visit. Knee-L DG 3 views: No results found for this or any previous visit. Knee-R DG 4 views: No results found for this or any previous visit. Knee-L DG 4 views: No results found for this or any previous visit. Knee-R DG Arthrogram: No results found for this or any previous visit. Knee-L DG Arthrogram: No results found for this or any previous visit.  Ankle Imaging: Ankle-R DG Complete:  Results for orders placed in visit on 01/02/14  DG Ankle Complete Right   Narrative 3 views of the right ankle were obtained of a skeletally mature  individual.  Study includes AP, oblique, lateral projections.  There is degenerative changes within the subtalar joint as well as the  talonavicular joint and naviculocuneiform joint. There is dorsal beaking  of the TN and  joints. Plantar calcaneal heel spur and retrocalcaneal  exostosis present. Hammertoe contractures present. No acute fractures.  Ankle joint preserved   Ankle-L DG Complete: No results found for this or any previous visit.  Foot Imaging: Foot-R DG Complete: No results found for this or  any previous visit. Foot-L DG Complete:  Results for orders placed in visit on 01/02/14  DG Foot Complete Left   Narrative 3 views of a skeletally mature individual were obtained of the left foot.  Retrocalcaneal heel spur present. Midfoot degenerative joint changes.  Elevation of the first ray present. Hammertoe contractures present.  No  acute fractures.    Elbow Imaging: Elbow-R DG Complete: No results found for this or any previous visit. Elbow-L DG Complete: No results found for this or any previous visit.  Wrist Imaging: Wrist-R DG Complete: No results found for this or any previous visit. Wrist-L DG Complete: No results found for this or any previous visit.  Hand Imaging: Hand-R DG Complete: No results found for this or any previous visit. Hand-L DG Complete: No results found for this or any  previous visit.  Complexity Note: {Blank single:19197::"No new results found.","No results found under the [facility] record.","Imaging results reviewed.","Imaging results reviewed. Results shared with Ms. Delpizzo, using Layman's terms."} {Blank single:19197::"Today I personally and independently reviewed the study images pertinent to Ms. Kulig's problem.","      "} {Blank single:19197::"Results visible under [facility].","Results visible under [facility].","Results visible under [facility].","Results visible under [facility].","Results visible under "Care Everywhere".","Results made available to patient.","Copy of results provided to patient.","     "} {Blank single:19197::"Images reviewed using the PACS system hyperlink.","    "} {Blank single:19197::"I have personally examined the images and I agree with the reported  findings. I find no additional pain-related pathology to add to the report.","I have personally examined the images. I agree with the Radiologist's report. The following observed findings are of concern to me:","    "}   Allergies  Ms. Scow has No Known Allergies.  Laboratory Chemistry  Inflammation Markers (CRP: Acute Phase) (ESR: Chronic Phase) Lab Results  Component Value Date   ESRSEDRATE 19 03/30/2012  {Blank single:19197::"NOTE:","Interpretation:","None reported","No significant anomalies detected.","            "} {Blank single:19197::"The combined elevation of both markers could suggest the possibility of an autoimmune disease.","The combined elevation of both markers in combination with negative rheumatology markers could signal a chronic inflammatory condition not associated with autoimmune disease.","Elevated CRP could suggest acute inflammation while elevated sed rate is typically seen with chronic inflammation.","Elevated CRP is usually seen with acute phase inflammatory disease.","Elevated set rate is usually seen with chronic phase inflammatory  disease.","        "}  Rheumatology Markers No results found for: RF, ANA, LABURIC, URICUR, LYMEIGGIGMAB, LYMEABIGMQN{Blank single:19197::"NOTE:","Interpretation:","None reported","No significant anomalies detected.","            "} {Blank single:19197::"       "}  Renal Function Markers Lab Results  Component Value Date   BUN 16 01/21/2015   CREATININE 0.99 01/21/2015  {Blank single:19197::"NOTE:","Interpretation:","None reported","No significant anomalies detected.","            "} {Blank single:19197::"BUN/Cr ratio >20:1 would suggests prerenal azotemia (dehydration or renal hypoperfusion.","BUN/Cr ratio <10:1 would suggests renal damage.","            "}   Hepatic Function Markers Lab Results  Component Value Date   AST 22 01/21/2015   ALT 13 01/21/2015   ALBUMIN 3.9 01/21/2015   ALKPHOS 105 01/21/2015   HCVAB NEGATIVE 01/21/2015  {Blank single:19197::"NOTE:","Interpretation:","None reported","No significant anomalies detected.","            "} {Blank single:19197::"       "}  Electrolytes Lab Results  Component Value Date   NA 140 01/21/2015   K 3.9 01/21/2015   CL 103 01/21/2015   CALCIUM 9.5 01/21/2015  {Blank single:19197::"NOTE:","Interpretation:","None reported","No significant anomalies detected.","            "} {Blank single:19197::"       "}  Neuropathy Markers Lab Results  Component Value Date   HGBA1C 4.8 01/21/2015   HIV NONREACTIVE 01/21/2015  {Blank single:19197::"NOTE:","Interpretation:","None reported","No significant anomalies detected.","            "} {Blank single:19197::"       "}  Bone Pathology Markers Lab Results  Component Value Date   VD25OH 26.66 (L) 05/14/2014  {Blank single:19197::"NOTE:","Interpretation:","None reported","No significant anomalies detected.","            "} {Blank single:19197::"Vitamin D deficiency/insufficiency, among other things, has been associated with chronic joint pain.","        "}  Coagulation Parameters Lab  Results  Component Value Date   PLT 127.0 (L) 09/16/2013  {Blank single:19197::"NOTE:","Interpretation:","None reported","No significant anomalies detected.","            "} {Blank single:19197::"A positive D-dimer result may indicate the presence of an abnormally high level of fibrin degradation products. It indicates that there may be significant blood clot (thrombus) formation and breakdown in the body, but it does not tell the location or cause.","       "}  Cardiovascular Markers Lab Results  Component Value Date   HGB 12.1 09/16/2013   HCT 36.3 09/16/2013  {Blank single:19197::"NOTE:","Interpretation:","None reported","No significant anomalies detected.","            "} {Blank single:19197::"       "}   CA Markers No results found for: CEA, CA125, LABCA2{Blank single:19197::"NOTE:","Interpretation:","None reported","No significant anomalies detected.","            "} {Blank single:19197::"       "}  Note: {Blank single:19197::"No results found under the [facility] record","Results made available to patient.","Lab results reviewed and made available to patient.","Lab results reviewed and explained to patient in Layman's terms.","Lab results reviewed."}  PFSH  Drug: Ms. Bourgoin  reports that she does not use drugs. Alcohol:  reports that she does not drink alcohol. Tobacco:  reports that she has been smoking.  She has never used smokeless tobacco. Medical:  has a past medical history of Abnormal EKG, CAD (coronary artery disease), Cancer ([place]), Endometrial adenocarcinoma ([place]) (2011), Hyperlipidemia, Hypertension, Osteoarthritis, Reflux gastritis, Thyroid disease, tobacco abuse, and Vaginal delivery. Family: family history includes Alcohol abuse in her father, maternal aunt, and mother; Cancer in her brother, brother, and maternal aunt; Heart disease in her brother.  Past Surgical History:  Procedure Laterality Date  . BREAST CYST ASPIRATION Right    neg  .  BREAST SURGERY Right March 2014   benign [facility]  . CARDIAC CATHETERIZATION  2006   40% LAD, EF 60%  . THYROIDECTOMY  2005   partial at [facility], non malignant tumor  . TOTAL ABDOMINAL HYSTERECTOMY     with BSO   Active Ambulatory Problems    Diagnosis Date Noted  . Osteoarthritis   . Endometrial cancer ([place])   . Tobacco abuse counseling 04/29/2011  . Hypothyroidism 04/29/2011  . Essential hypertension 04/29/2011  . Esophageal reflux 04/29/2011  . 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 Hyperlipidemia LDL goal <100 02/08/2012  . Transient vision disturbance, bilateral 04/02/2012  . Unspecified constipation 10/02/2012  . Cough 09/16/2013  . Dysphagia 09/16/2013  .  Obesity, unspecified 12/18/2013  . Annual physical exam 12/18/2013  . S/P breast biopsy 12/18/2013  . Pes planus of both feet 12/18/2013  . Shortness of breath 04/17/2017  . Cellulitis of left lower limb 04/17/2017  . Edema, unspecified 04/17/2017  . 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 Chronic kidney disease, unspecified 04/17/2017  . Anxiety disorder, unspecified 04/17/2017  . Nicotine dependence, cigarettes, uncomplicated 02/02/8526  . Acute pain of left knee 07/17/2017  . Plantar fascia syndrome 07/17/2017  . Acute left-sided low back pain without sciatica 07/19/2017   Resolved Ambulatory Problems    Diagnosis Date Noted  . Thyroid disease   . Tobacco abuse 04/29/2011   Past Medical History:  Diagnosis Date  . Abnormal EKG   . CAD (coronary artery disease)   . Cancer ([place])   . Endometrial adenocarcinoma ([place]) 2011  . Hyperlipidemia   . Hypertension   . Osteoarthritis   . Reflux gastritis   . Thyroid disease   . tobacco abuse   . Vaginal delivery    Constitutional Exam  General appearance: {general exam:210120802::"Well nourished, well developed, and well hydrated. In no apparent acute  distress"} Vitals:   08/29/17 0912  BP: (!) 132/103  Pulse: 94  Resp: 18  Temp: 98.8 F (37.1 C)  SpO2: 100%  Weight: 212 lb (96.2 kg)  Height: '5\' 8"'$  (1.727 m)   BMI Assessment: Estimated body mass index is 32.23 kg/m as calculated from the following:   Height as of this encounter: '5\' 8"'$  (1.727 m).   Weight as of this encounter: 212 lb (96.2 kg).  BMI interpretation table: BMI level Category Range association with higher incidence of chronic pain  <18 kg/m2 Underweight   18.5-24.9 kg/m2 Ideal body weight   25-29.9 kg/m2 Overweight Increased incidence by 20%  30-34.9 kg/m2 Obese (Class I) Increased incidence by 68%  35-39.9 kg/m2 Severe obesity (Class II) Increased incidence by 136%  >40 kg/m2 Extreme obesity (Class III) Increased incidence by 254%   Patient's current BMI Ideal Body weight  Body mass index is 32.23 kg/m. Ideal body weight: 63.9 kg (140 lb 14 oz) Adjusted ideal body weight: 76.8 kg (169 lb 5.2 oz)   BMI Readings from Last 4 Encounters:  08/29/17 32.23 kg/m  07/17/17 32.81 kg/m  06/29/17 34.67 kg/m  04/17/17 34.97 kg/m   Wt Readings from Last 4 Encounters:  08/29/17 212 lb (96.2 kg)  07/17/17 219 lb (99.3 kg)  06/29/17 228 lb (103.4 kg)  04/17/17 230 lb (104.3 kg)  Psych/Mental status: {Blank single:19197::"Alert and oriented x 3. Exaggerated physical and/or psychosocial pain behavior perceived.","Alert, oriented x 3 (person, place, & time)"} {Blank single:19197::"Ms. Ortlieb's speech pattern and demeanor seems to suggest oversedation","     "} Eyes: {Blank single:19197::"Miotic (pupilary constriction) due to opiate use","Midriatic","Anisocoric","Evidence of ptosis","Pin-point pupils","PERLA"} Respiratory: {Blank single:19197::"Oxygen-dependent COPD","No evidence of acute respiratory distress"}  Cervical Spine Area Exam  Skin & Axial Inspection: {Blank single:19197::"Well healed scar from previous spine surgery detected","Paravertebral muscle  atrophy","No masses, redness, edema, swelling, or associated skin lesions"} Alignment: {Blank single:19197::"Asymmetric","Symmetrical"} Functional ROM: {Blank single:19197::"Improved after treatment","Adequate ROM","Decreased ROM","Diminished ROM","Full ROM","Fused","Grossly intact ROM","Guarding","Limited ROM","Mechanically restricted ROM","Minimal ROM","Pain restricted ROM","Restricted ROM","Zero ROM","ROM appears unrestricted","ROM is within functional limits (WFL)","ROM is within normal limits (WNL)","Unrestricted ROM"}{Blank single:19197::", to the right",", to the left",", bilaterally","     "} Stability: {Blank single:19197::"Possibly unstable","No instability detected"} Muscle Tone/Strength: {Blank single:19197::"Inconsistent level of performance when tested","Guarding observed","Functionally intact. No obvious neuro-muscular anomalies detected."} Sensory (Neurological): {  Blank single:19197::"Improved","Movement-associated pain","Movement-associated discomfort","Impaired sensorium","Articular pain pattern","Arthropathic arthralgia","Dermatomal pain pattern","Musculoskeletal pain pattern","Myotome pain pattern","Neurogenic pain pattern","Neuropathic pain pattern","Referred pain pattern","Visceral pain pattern","Allodynia (Painful response to non-painful stimuli)","Anesthesia (Absence of sensation)","Anesthesia Dolorosa (Numbness over painful area)","Dysesthesias (Unpleasant sensation to touch)","Hyperalgesia (Increased sensitivity to pain)","Hyperesthesia (Increased sensitivity to touch)","Hyperpathia (Painful, exaggerated response to nociceptive stimuli)","Hypoalgesia (Decreased sensitivity to painful stimuli)","Hypoesthesia/Hypesthesia (Reduced sensation to touch)","Paresthesia (Burning sensation)","Paresthesia (Tingling sensation)","WNL","No anomaly detected","Unimpaired"} Palpation: {Blank single:19197::"Tender","Complains of area being tender to  palpation","Uncomfortable","Positive","Negative","Increased muscle tone","Trigger Point","Muscular Atrophy","Non-tender","No complaints of tenderness","Hyperthermic","Hypothermic","Euthermic","No palpable anomalies"} {Blank single:19197::"Positive provocative maneuver for","Negative provocative maneuver for","Negative cervical compression test","Cervical compression test positive","Negative Tinel's test","Tinel's test","Negative Trousseau's test","Trousseau's test (3 minute blood pressure cuff test) for hypocalcemia","     "} {Blank single:19197::"for right occipital neuralgia","for left occipital neuralgia","for bilateral occipital neuralgia","for cervical facet disease","     "}  Upper Extremity (UE) Exam    Side: Right upper extremity  Side: Left upper extremity  Skin & Extremity Inspection: {Blank single:19197::"Evidence of prior arthroplastic surgery","Below elbow amputation (BEA)","Above elbow amputation (AEA)","Contracture","Atrophy","Dystrophy","Degenerative arthropathy with ulnar deviation","Degenerative deforming arthropathy","Heberden's nodes (DIP)","Bouchard's nodes (PIP)","No gross anomalies detected","Edema","Positive color changes","Some redness observed","Increased temperature","Acrocyanosis","Skin color, temperature, and hair growth are WNL. No peripheral edema or cyanosis. No masses, redness, swelling, asymmetry, or associated skin lesions. No contractures."}  Skin & Extremity Inspection: {Blank single:19197::"Evidence of prior arthroplastic surgery","Below elbow amputation (BEA)","Above elbow amputation (AEA)","Contracture","Atrophy","Dystrophy","Degenerative arthropathy with ulnar deviation","Degenerative deforming arthropathy","Heberden's nodes (DIP)","Bouchard's nodes (PIP)","No gross anomalies detected","Edema","Positive color changes","Some redness observed","Increased temperature","Acrocyanosis","Skin color, temperature, and hair growth are WNL. No peripheral edema or cyanosis. No  masses, redness, swelling, asymmetry, or associated skin lesions. No contractures."}  Functional ROM: {Blank single:19197::"Improved after treatment","Impaired ROM","Adequate ROM","Decreased ROM","Diminished ROM","Full ROM","Fused","Grossly intact ROM","Guarding","Limited ROM","Mechanically restricted ROM","Minimal ROM","Pain restricted ROM","Restricted ROM","Zero ROM","ROM appears unrestricted","ROM is within functional limits (WFL)","ROM is within normal limits (WNL)","Unrestricted ROM"} {Blank single:19197::"for shoulder","for elbow","for shoulder and elbow","for wrist","for wrist and hand","for hand","for all joints of upper extremity","       "}  Functional ROM: {Blank single:19197::"Improved after treatment","Impaired ROM","Adequate ROM","Decreased ROM","Diminished ROM","Full ROM","Fused","Grossly intact ROM","Guarding","Limited ROM","Mechanically restricted ROM","Minimal ROM","Pain restricted ROM","Restricted ROM","Zero ROM","ROM appears unrestricted","ROM is within functional limits (WFL)","ROM is within normal limits (WNL)","Unrestricted ROM"} {Blank single:19197::"for shoulder","for elbow","for shoulder and elbow","for wrist","for wrist and hand","for hand","for all joints of upper extremity","       "}  Muscle Tone/Strength: {Blank single:19197::"Inconsistent level of performance when tested","C5 myotomal weakness (Elbow Flexion)","C6 myotomal weakness (Wrist Extension)","C7 myotomal weakness (Elbow Extension","C8 myotomal weakness (Finger Extension)","T1 myotomal weakness (Finger Abduction and Adduction)","Generalized upper extremity weakness","Normal strength (5/5)","Movement possible against some resistance (4/5)","Movement possible against gravity, but not against resistance (3/5)","Movement possible, but not against gravity (5/3)","GDJMEQ flickering, but no movement (1/5)","No motor contraction (0/5)","Flaccid paralysis","Spastic paralysis","Cogwheel rigidity","Clasp-knife rigidity","Give-away  weakness","Deconditioned","Mild-to-medarate deconditioning","Moderate-to-severe deconditioning","Guarding","WNL","Unremarkable","Grossly normal","Grossly intact","Functionally intact. No obvious neuro-muscular anomalies detected."}  Muscle Tone/Strength: {Blank single:19197::"Inconsistent level of performance when tested","C5 myotomal weakness (Elbow Flexion)","C6 myotomal weakness (Wrist Extension)","C7 myotomal weakness (Elbow Extension","C8 myotomal weakness (Finger Extension)","T1 myotomal weakness (Finger Abduction and Adduction)","Generalized upper extremity weakness","Normal strength (5/5)","Movement possible against some resistance (4/5)","Movement possible against gravity, but not against resistance (3/5)","Movement possible, but not against gravity (6/8)","TMHDQQ flickering, but no movement (1/5)","No motor contraction (0/5)","Flaccid paralysis","Spastic paralysis","Cogwheel rigidity","Clasp-knife rigidity","Give-away weakness","Deconditioned","Mild-to-medarate deconditioning","Moderate-to-severe deconditioning","Guarding","WNL","Unremarkable","Grossly normal","Grossly intact","Functionally intact. No obvious neuro-muscular anomalies detected."}  Sensory (Neurological): {Blank single:19197::"Improved","Movement-associated pain","Movement-associated discomfort","Impaired sensorium","Articular pain pattern","Arthropathic arthralgia","Dermatomal pain pattern","Non-dermatomal pain pattern","Musculoskeletal pain pattern","Myotome pain pattern","Neurogenic pain pattern","Neuropathic pain pattern","Referred pain pattern","Visceral pain pattern","Allodynia (Painful response to non-painful stimuli)","Anesthesia (Absence of sensation)","Anesthesia Dolorosa (Numbness over painful area)","Dysesthesias (Unpleasant sensation to touch)","Hyperalgesia (Increased sensitivity to pain)","Hyperesthesia (Increased sensitivity  to touch)","Hyperpathia (Painful, exaggerated response to nociceptive stimuli)","Hypoalgesia  (Decreased sensitivity to painful stimuli)","Hypoesthesia/Hypesthesia (Reduced sensation to touch)","Paresthesia (Burning sensation)","Paresthesia (Tingling sensation)","WNL","No anomaly detected","Unimpaired"} {Blank single:19197::"affecting the shoulder","affecting the elbow","affecting the shoulder and elbow","affecting the wrist","affecting the wrist and hand","affecting the hand","affecting all joints of upper extremity","       "}  Sensory (Neurological): {Blank single:19197::"Improved","Movement-associated pain","Movement-associated discomfort","Impaired sensorium","Articular pain pattern","Arthropathic arthralgia","Dermatomal pain pattern","Non-dermatomal pain pattern","Musculoskeletal pain pattern","Myotome pain pattern","Neurogenic pain pattern","Neuropathic pain pattern","Referred pain pattern","Visceral pain pattern","Allodynia (Painful response to non-painful stimuli)","Anesthesia (Absence of sensation)","Anesthesia Dolorosa (Numbness over painful area)","Dysesthesias (Unpleasant sensation to touch)","Hyperalgesia (Increased sensitivity to pain)","Hyperesthesia (Increased sensitivity to touch)","Hyperpathia (Painful, exaggerated response to nociceptive stimuli)","Hypoalgesia (Decreased sensitivity to painful stimuli)","Hypoesthesia/Hypesthesia (Reduced sensation to touch)","Paresthesia (Burning sensation)","Paresthesia (Tingling sensation)","WNL","No anomaly detected","Unimpaired"} {Blank single:19197::"affecting the shoulder","affecting the elbow","affecting the shoulder and elbow","affecting the wrist","affecting the wrist and hand","affecting the hand","affecting all joints of upper extremity","       "}  Palpation: {Blank single:19197::"Tender","Complains of area being tender to palpation","Uncomfortable","Positive","Negative","Increased muscle tone","Trigger Point","Muscular Atrophy","Non-tender","No complaints of tenderness","Hyperthermic","Hypothermic","Euthermic","No palpable anomalies"} {Blank  single:19197::"Tinel's test","Trousseau's test (3 minute blood pressure cuff test) for hypocalcemia","     "} {Blank single:19197::"for right CTS","for left CTS","for bilateral CTS","     "}  Palpation: {Blank single:19197::"Tender","Complains of area being tender to palpation","Uncomfortable","Positive","Negative","Increased muscle tone","Trigger Point","Muscular Atrophy","Non-tender","No complaints of tenderness","Hyperthermic","Hypothermic","Euthermic","No palpable anomalies"} {Blank single:19197::"Tinel's test","Trousseau's test (3 minute blood pressure cuff test) for hypocalcemia","     "} {Blank single:19197::"for right CTS","for left CTS","for bilateral CTS","     "}  Provocative Test(s):  Phalen's test: {Blank single:19197::"(+) for CTS","(-)","deferred"} Tinel's test: {Blank single:19197::"(+) for CTS","(+) for ulnar neuropathy at elbow grove","(+) for ulnar neuropathy at [place]","(-)","deferred"} Apley's scratch test (touch opposite shoulder):  Action 1 (Across chest): {Blank single:19197::"Decreased ROM","Adequate ROM","deferred"} Action 2 (Overhead): {Blank single:19197::"Decreased ROM","Adequate ROM","deferred"} Action 3 (LB reach): {Blank single:19197::"Decreased ROM","Adequate ROM","deferred"}   Provocative Test(s):  Phalen's test: {Blank single:19197::"(+) for CTS","(-)","deferred"} Tinel's test: {Blank single:19197::"(+) for CTS","(+) for ulnar neuropathy at elbow grove","(+) for ulnar neuropathy at [place]","(-)","deferred"} Apley's scratch test (touch opposite shoulder):  Action 1 (Across chest): {Blank single:19197::"Decreased ROM","Adequate ROM","deferred"} Action 2 (Overhead): {Blank single:19197::"Decreased ROM","Adequate ROM","deferred"} Action 3 (LB reach): {Blank single:19197::"Decreased ROM","Adequate ROM","deferred"}    Thoracic Spine Area Exam  Skin & Axial Inspection: {Blank single:19197::"Well healed scar from previous spine surgery detected","prominent  thoracic Kyphosis","Significant thoracic kyphosis","Paravertebral muscle atrophy","No masses, redness, or swelling"} Alignment: {Blank single:19197::"Asymmetric","Symmetrical"} Functional ROM: {Blank single:19197::"Improved after treatment","Adequate ROM","Decreased ROM","Diminished ROM","Full ROM","Fused","Grossly intact ROM","Guarding","Limited ROM","Mechanically restricted ROM","Minimal ROM","Pain restricted ROM","Restricted ROM","Zero ROM","ROM appears unrestricted","ROM is within functional limits (WFL)","ROM is within normal limits (WNL)","Unrestricted ROM"} Stability: {Blank single:19197::"Possibly unstable","No instability detected"} Muscle Tone/Strength: {Blank single:19197::"Increased muscle tone over affected area","Inconsistent level of performance when tested","Functionally intact. No obvious neuro-muscular anomalies detected."} Sensory (Neurological): {Blank single:19197::"Improved","Movement associated pain","Movement associated discomfort","Impaired sensorium","Articular pain pattern","Dermatomal pain pattern","Musculoskeletal pain pattern","Myotome pain pattern","Neurogenic pain pattern","Neuropathic pain pattern","Referred pain pattern","Visceral pain pattern","Allodynia (Painful response to non-painful stimuli)","Anesthesia (Absence of sensation)","Anesthesia Dolorosa (Numbness over painful area)","Dysesthesias (Unpleasant sensation to touch)","Hyperalgesia (Increased sensitivity to pain)","Hyperesthesia (Increased sensitivity to touch)","Hyperpathia (Painful, exaggerated response to nociceptive stimuli)","Hypoalgesia (Decreased sensitivity to painful stimuli)","Hypoesthesia/Hypesthesia (Reduced sensation to touch)","Paresthesia (Burning sensation)","Paresthesia (Tingling sensation)","WNL","No anomaly detected","Unimpaired"} Muscle strength & Tone: {Blank single:19197::"Tender","Complains of area being tender to palpation","Uncomfortable","Positive","Negative","Increased muscle  tone","Trigger Point","Muscular Atrophy","Non-tender","No complaints of tenderness","Hyperthermic","Hypothermic","Euthermic","No palpable anomalies"}  Lumbar Spine Area Exam  Skin & Axial Inspection: {Blank single:19197::"Well healed scar from previous spine surgery detected","Thoraco-lumbar Scoliosis","Lumbar Scoliosis","Paravertebral muscle atrophy","No masses, redness, or swelling"} Alignment: {Blank single:19197::"Scoliosis detected","Levoscoliosis","Dextroscoliosis","Asymmetric","Symmetrical"} Functional ROM: {Blank single:19197::"Improved after treatment","Adequate ROM","Decreased ROM","Diminished ROM","Full  ROM","Fused","Grossly intact ROM","Guarding","Limited ROM","Mechanically restricted ROM","Minimal ROM","Pain restricted ROM","Restricted ROM","Zero ROM","ROM appears unrestricted","ROM is within functional limits (WFL)","ROM is within normal limits (WNL)","Unrestricted ROM"} {Blank single:19197::"affecting primarily the right","affecting primarily the left","affecting both sides","     "} Stability: {Blank single:19197::"Possibly unstable","No instability detected"} Muscle Tone/Strength: {Blank single:19197::"Increased muscle tone over affected area","Inconsistent level of performance when tested","Functionally intact. No obvious neuro-muscular anomalies detected."} Sensory (Neurological): {Blank single:19197::"Improved","Movement-associated pain","Movement-associated discomfort","Impaired sensorium","Articular pain pattern","Dermatomal pain pattern","Musculoskeletal pain pattern","Myotome pain pattern","Neurogenic pain pattern","Neuropathic pain pattern","Referred pain pattern","Visceral pain pattern","Allodynia (Painful response to non-painful stimuli)","Anesthesia (Absence of sensation)","Anesthesia Dolorosa (Numbness over painful area)","Dysesthesias (Unpleasant sensation to touch)","Hyperalgesia (Increased sensitivity to pain)","Hyperesthesia (Increased sensitivity to touch)","Hyperpathia  (Painful, exaggerated response to nociceptive stimuli)","Hypoalgesia (Decreased sensitivity to painful stimuli)","Hypoesthesia/Hypesthesia (Reduced sensation to touch)","Paresthesia (Burning sensation)","Paresthesia (Tingling sensation)","WNL","No anomaly detected","Unimpaired"} Palpation: {Blank single:19197::"Tender","Complains of area being tender to palpation","Uncomfortable","Positive","Negative","Increased muscle tone","Trigger Point","Muscular Atrophy","Non-tender","No complaints of tenderness","Hyperthermic","Hypothermic","Euthermic","No palpable anomalies"} {Blank single:19197::"Right Fist Percussion Test","Left Fist Percussion Test","Bilateral Fist Percussion Test","     "} Provocative Tests: Lumbar Hyperextension/rotation test: {Blank single:19197::"Positive","Negative","Equivocal","Improved after treatment","Unable to perform","Non-contributory","improved","worsened","no change from prior assessment","deferred today"} {Blank single:19197::"bilaterally for facet joint pain.","on the right for facet joint pain.","on the left for facet joint pain.","due to pain.","due to fusion restriction.","     "} Lumbar quadrant test (Kemp's test): {Blank single:19197::"(+)","(-)","Equivocal","Improved after treatment","Unable to perform","Non-contributory","improved","worsened","no change from prior assessment","deferred today"} {Blank single:19197::"bilaterally for facet joint pain.","bilateral for foraminal stenosis","on the right for foraminal stenosis","on the right for facet joint pain.","on the left for foraminal stenosis","on the left for facet joint pain.","due to pain.","due to fusion restriction.","     "} Lumbar Lateral bending test: {Blank single:19197::"(+)","(-)","Equivocal","Improved after treatment","Unable to perform","Non-contributory","improved","worsened","no change from prior assessment","deferred today"} {Blank single:19197::"ipsilateral radicular pain, bilaterally. Positive for bilateral  foraminal stenosis.","ipsilateral radicular pain, on the right. Positive for right-sided foraminal stenosis.","ipsilateral radicular pain, on the left. Positive for left-sided foraminal stenosis.","due to pain.","due to fusion restriction.","     "} Patrick's Maneuver: {Blank single:19197::"(+)","(-)","Non-diagnostic","Improved after treatment","Unable to perform","worsened","unimproved","Unchanged","no change from prior assessment","deferred today"} {Blank single:19197::"for bilateral S-I arthralgia","for right-sided S-I arthralgia","for left-sided S-I arthralgia","for bilateral hip arthralgia","for right hip arthralgia","for left hip arthralgia","due to pain","due to fusion restriction","     "} {Blank single:19197::"and","     "} {Blank single:19197::"for bilateral hip arthralgia","for right hip arthralgia","for left hip arthralgia","due to pain","due to fusion restriction","     "} FABER test: {Blank single:19197::"Positive","Negative","Non-diagnostic","Improved after treatment","Unable to perform","worsened","unimproved","Unchanged","no change from prior assessment","deferred today"} {Blank single:19197::"for bilateral S-I arthralgia","for right-sided S-I arthralgia","for left-sided S-I arthralgia","     "} Thigh-thrust test: {Blank single:19197::"Positive","Negative","Non-diagnostic","Improved after treatment","Unable to perform","worsened","unimproved","Unchanged","no change from prior assessment","deferred today"} {Blank single:19197::"for bilateral S-I arthralgia","for right-sided S-I arthralgia","for left-sided S-I arthralgia","     "} S-I compression test: {Blank single:19197::"Positive","Negative","Non-diagnostic","Improved after treatment","Unable to perform","worsened","unimproved","Unchanged","no change from prior assessment","deferred today"} {Blank single:19197::"for bilateral S-I arthralgia","for right-sided S-I arthralgia","for left-sided S-I arthralgia","     "} S-I distraction test: {Blank  single:19197::"Positive","Negative","Non-diagnostic","Improved after treatment","Unable to perform","worsened","unimproved","Unchanged","no change from prior assessment","deferred today"} {Blank single:19197::"for bilateral S-I arthralgia","for right-sided S-I arthralgia","for left-sided S-I arthralgia","     "}  Gait & Posture Assessment  Ambulation: {Blank single:19197::"Limited","Patient ambulates using a cane","Patient ambulates using crutches","Patient ambulates using a walker","Patient ambulates using a wheel chair","Patient came in today in a wheel chair","Patient brought in today on a stretcher","Independent. Level and Non-level surfaces","Independent. Level surfaces only","Dependent. Supervision required","Dependent. Level I (intermittent assistance required)","Dependent. Level II (constant assistance required)","Incapable of ambulation without assistance","Nonfunctional","Unassisted"} Gait: {Blank single:19197::"Limited. Using assistive device to ambulate","Significantly limited. Dependent on assistive device to ambulate","Age-related, senile gait pattern","Antalgic","Antalgic gait (limping)","Apraxia (Inability to execute  a movement, upon request, without loss of motor or sensory function)","Ataxic","Ataxia (Appendicular)","Ataxia (Cerebellar) (Irregular, uncoordinated movement with inability to balance on one leg or perform tandem gait test)","Ataxia (Friedreich's)","Ataxia (Poor voluntary coordination)","Ataxia (Sensory) (Unsteady "stomping" gait with heavy heel strikes. Postural instability worsened by closing eyes.)","Ataxia (Truncal) ("Drunken sailor" gait)","Ataxia (Vestibular)","Awkward","Cautious","Charlie-Chaplin (due to tibial torsion)","Choreiform (Irregular, jerky, involuntary movements) (Hyperkinetic)","Circumduction gait (due to hemiplegia)","Clumsy","Compensatory","Dystaxia (Mild degree of ataxia)","Dystonic","Flaccid paralysis","Frontal gait (Apraxia)","Functionally WNL","Grossly  intact","Hemiplegia","Hemiataxia (Ataxia limited to one side)","Hemiparesis","Hemiparetic (Post-stroke)(Ipsilateral arm flexion and tiptoe/lateral foot walk)","High stepping gait (foot drop)","Improved after treatment","Modified gait pattern (slower gait speed, wider stride width, and longer stance duration) associated with morbid obesity","Paraparetic","Paraparetic (Stiffness, extension, adduction and scissoring of both legs)","Paraplegia","Parkinsonian","Psychogenic","Scissor gait (cerebral palsy)","Shuffling gait","Staggering","Steppage","Stiff hip gait (hip ankylosis)","Stumbling","Trendelenburg (unstable hip)","Unaffected","Uneven","Unstable","Waddling (Hip pathology)","Relatively normal for age and body habitus"} Posture: {Blank single:19197::"Antalgic","Difficulty standing up straight, due to pain","Positive Romberg's test (Sensory Ataxia)(Worsening of balance and pointing with eyes closed)","Painful","Recombent","Relaxed","Tense","Difficulty with positional changes","Sway back","Lumbar lordosis","Thoracic kyphosis","Kyphosis-lordosis","Flat back","Forward head","Neutral Spine","Slouching","Drooping","Rigid","Poor","Good","WNL"}   Lower Extremity Exam    Side: Right lower extremity  Side: Left lower extremity  Stability: {Blank single:19197::"Dislocated","Subluxated","Laxity of","Unstable","Stable","No instability observed"} {Blank single:19197::"hip joint","for knee joint","hip and knee joints","Ankle","Toe","       "}  Stability: {Blank single:19197::"Dislocated","Subluxated","Laxity of","Unstable","Stable","No instability observed"} {Blank single:19197::"hip joint","for knee joint","hip and knee joints","Ankle","Toe","       "}  Skin & Extremity Inspection: {Blank single:19197::"Evidence of prior arthroplastic surgery","Below knee amputation (BKA)","Above knee amputation (AKA)","Contracture","Atrophy","Dystrophy","No gross anomalies detected","Edema","Pitting edema","Venous stasis edema","Positive  color changes","Some redness observed","Increased temperature","Acrocyanosis","Skin color, temperature, and hair growth are WNL. No peripheral edema or cyanosis. No masses, redness, swelling, asymmetry, or associated skin lesions. No contractures."}  Skin & Extremity Inspection: {Blank single:19197::"Evidence of prior arthroplastic surgery","Below knee amputation (BKA)","Above knee amputation (AKA)","Contracture","Atrophy","Dystrophy","No gross anomalies detected","Edema","Pitting edema","Venous stasis edema","Positive color changes","Some redness observed","Increased temperature","Acrocyanosis","Skin color, temperature, and hair growth are WNL. No peripheral edema or cyanosis. No masses, redness, swelling, asymmetry, or associated skin lesions. No contractures."}  Functional ROM: {Blank single:19197::"Improved after treatment","Impaired ROM","Adequate ROM","Decreased ROM","Diminished ROM","Full ROM","Fused","Grossly intact ROM","Guarding","Limited ROM","Mechanically restricted ROM","Minimal ROM","Pain restricted ROM","Restricted ROM","Zero ROM","ROM appears unrestricted","ROM is within functional limits (WFL)","ROM is within normal limits (WNL)","Unrestricted ROM"} {Blank single:19197::"for hip joint","for knee joint","for hip and knee joints","for all joints of the lower extremity","       "} {Blank single:19197::"Limited SLR (straight leg raise)","Adequate SLR (straight leg raise)","SLR WNL","       "}  Functional ROM: {Blank single:19197::"Improved after treatment","Impaired ROM","Adequate ROM","Decreased ROM","Diminished ROM","Full ROM","Fused","Grossly intact ROM","Guarding","Limited ROM","Mechanically restricted ROM","Minimal ROM","Pain restricted ROM","Restricted ROM","Zero ROM","ROM appears unrestricted","ROM is within functional limits (WFL)","ROM is within normal limits (WNL)","Unrestricted ROM"} {Blank single:19197::"for hip joint","for knee joint","for hip and knee joints","for all joints of the lower  extremity","       "} {Blank single:19197::"Limited SLR (straight leg raise)","Adequate SLR (straight leg raise)","SLR WNL","       "}  Muscle Tone/Strength: {Blank single:19197::"Able to Toe-walk & Heel-walk without problems","Foot drop (L5 vs Deep Peroneal)","L2 myotomal weakness (Hip Flexion)","L3 myotomal weakness (Knee Extension)","L4 myotomal weakness (Foot Dorsiflexion)","L5 myotomal (EHL) weakness (Toe Dorsiflexion)","S1 myotomal weakness (Foot Plantar Flexion)","Give-away weakness","Deconditioned","Mild-to-moderate deconditioning","Moderate-to-severe deconditioning","Generalized lower extremity weakness","Guarding","Inconsistent level of performance when tested","Normal strength (5/5)","Movement possible against some resistance (4/5)","Movement possible against gravity, but not against resistance (3/5)","Movement possible, but not against gravity (1/6)","XWRUEA flickering, but no movement (1/5)","No motor contraction (0/5)","Paraparesis","Paraplegia","Hemiparesis","Hemeplegia","Flaccid paralysis","Spastic paralysis","Cogwheel rigidity","Clasp-knife rigidity","WNL","Unremarkable","Grossly normal","Grossly intact","Functionally intact. No obvious neuro-muscular anomalies detected."}  Muscle Tone/Strength: {Blank single:19197::"Able to Toe-walk & Heel-walk without problems","Foot drop (L5 vs Deep Peroneal)","L2 myotomal weakness (Hip Flexion)","L3 myotomal weakness (Knee Extension)","L4 myotomal weakness (Foot Dorsiflexion)","L5 myotomal (EHL)  weakness (Toe Dorsiflexion)","S1 myotomal weakness (Foot Plantar Flexion)","Give-away weakness","Deconditioned","Mild-to-moderate deconditioning","Moderate-to-severe deconditioning","Generalized lower extremity weakness","Guarding","Inconsistent level of performance when tested","Normal strength (5/5)","Movement possible against some resistance (4/5)","Movement possible against gravity, but not against resistance (3/5)","Movement possible, but not against gravity  (3/8)","HWEXHB flickering, but no movement (1/5)","No motor contraction (0/5)","Paraparesis","Paraplegia","Hemiparesis","Hemeplegia","Flaccid paralysis","Spastic paralysis","Cogwheel rigidity","Clasp-knife rigidity","WNL","Unremarkable","Grossly normal","Grossly intact","Functionally intact. No obvious neuro-muscular anomalies detected."}  Sensory (Neurological): {Blank single:19197::"Improved","Movement-associated pain","Movement-associated discomfort","Impaired sensorium","Articular pain pattern","Arthropathic arthralgia","Dermatomal pain pattern","Non-dermatomal pain pattern","Musculoskeletal pain pattern","Myotome pain pattern","Neurogenic pain pattern","Neuropathic pain pattern","Referred pain pattern","Visceral pain pattern","Allodynia (Painful response to non-painful stimuli)","Anesthesia (Absence of sensation)","Anesthesia Dolorosa (Numbness over painful area)","Dysesthesias (Unpleasant sensation to touch)","Hyperalgesia (Increased sensitivity to pain)","Hyperesthesia (Increased sensitivity to touch)","Hyperpathia (Painful, exaggerated response to nociceptive stimuli)","Hypoalgesia (Decreased sensitivity to painful stimuli)","Hypoesthesia/Hypesthesia (Reduced sensation to touch)","Paresthesia (Burning sensation)","Paresthesia (Tingling sensation)","WNL","No anomaly detected","Unimpaired"}  Sensory (Neurological): {Blank single:19197::"Improved","Movement-associated pain","Movement-associated discomfort","Impaired sensorium","Articular pain pattern","Arthropathic arthralgia","Dermatomal pain pattern","Non-dermatomal pain pattern","Musculoskeletal pain pattern","Myotome pain pattern","Neurogenic pain pattern","Neuropathic pain pattern","Referred pain pattern","Visceral pain pattern","Allodynia (Painful response to non-painful stimuli)","Anesthesia (Absence of sensation)","Anesthesia Dolorosa (Numbness over painful area)","Dysesthesias (Unpleasant sensation to touch)","Hyperalgesia (Increased sensitivity to  pain)","Hyperesthesia (Increased sensitivity to touch)","Hyperpathia (Painful, exaggerated response to nociceptive stimuli)","Hypoalgesia (Decreased sensitivity to painful stimuli)","Hypoesthesia/Hypesthesia (Reduced sensation to touch)","Paresthesia (Burning sensation)","Paresthesia (Tingling sensation)","WNL","No anomaly detected","Unimpaired"}  Palpation: {Blank single:19197::"Tender","Complains of area being tender to palpation","Uncomfortable","Positive","Negative","Increased muscle tone","Trigger Point","Muscular Atrophy","Non-tender","No complaints of tenderness","Hyperthermic","Hypothermic","Euthermic","No palpable anomalies"}  Palpation: {Blank single:19197::"Tender","Complains of area being tender to palpation","Uncomfortable","Positive","Negative","Increased muscle tone","Trigger Point","Muscular Atrophy","Non-tender","No complaints of tenderness","Hyperthermic","Hypothermic","Euthermic","No palpable anomalies"}   Assessment  Primary Diagnosis & Pertinent Problem List: The primary encounter diagnosis was Primary osteoarthritis of right ankle. Diagnoses of Plantar fascia syndrome, Class 1 obesity without serious comorbidity with body mass index (BMI) of 30.0 to 30.9 in adult, unspecified obesity type, Chronic pain syndrome, Chronic use of opiate for therapeutic purpose, and Pharmacologic therapy were also pertinent to this visit.  Visit Diagnosis (New problems to examiner): 1. Primary osteoarthritis of right ankle   2. Plantar fascia syndrome   3. Class 1 obesity without serious comorbidity with body mass index (BMI) of 30.0 to 30.9 in adult, unspecified obesity type   4. Chronic pain syndrome   5. Chronic use of opiate for therapeutic purpose   6. Pharmacologic therapy    Plan of Care (Initial workup plan)  Note: {Blank single:19197::"Please be advised that as per protocol, today's visit has been an evaluation only. We have not taken over the patient's controlled substance  management."}  Problem-specific plan: No problem-specific Assessment & Plan notes found for this encounter.  Ordered Lab-work, Procedure(s), Referral(s), & Consult(s): Orders Placed This Encounter  Procedures  . Comprehensive metabolic panel  . Magnesium  . Vitamin B12  . Compliance Drug Analysis, Ur   Pharmacotherapy (current): Medications ordered:  Meds ordered this encounter  Medications  . diclofenac (VOLTAREN) 75 MG EC tablet    Sig: Take 1 tablet (75 mg total) by mouth 2 (two) times daily.    Dispense:  60 tablet    Refill:  1   Medications administered during this visit: Detra M. Ade had no medications administered during this visit.   Pharmacological management options:  Opioid Analgesics: The patient was informed that there is no guarantee that she would be a candidate for opioid analgesics. The decision will be made following CDC guidelines. This decision will be based on the results of diagnostic studies, as well as Ms. Caradonna's risk profile.   Membrane stabilizer: {Blank single:19197::"Not indicated","Medically contraindicated","Tried and failed","To be determined at a later time"}  Muscle relaxant: {Blank single:19197::"Not indicated","Medically contraindicated","Tried and failed","To be determined  at a later time"}  NSAID: {Blank single:19197::"Not indicated","Medically contraindicated","Tried and failed","To be determined at a later time"}  Other analgesic(s): {Blank single:19197::"Not indicated","Medically contraindicated","Tried and failed","To be determined at a later time"}   Interventional management options: Ms. Sarsfield was informed that there is no guarantee that she would be a candidate for interventional therapies. The decision will be based on the results of diagnostic studies, as well as Ms. Faeth's risk profile.  Procedure(s) under consideration:  ***   Provider-requested follow-up: Return in about 3 weeks (around 09/19/2017) for Medication  Management.  Future Appointments  Date Time Provider [place]  09/01/2017 10:15 AM [facility]  09/14/2017  9:30 AM Ronnell Freshwater, NP [facility] None  09/21/2017  8:45 AM Gillis Santa, MD [facility] None    Primary Care Physician: Ronnell Freshwater, NP Location: [facility] Note by: Gillis Santa, M.D, Date: 08/29/2017; Time: 2:46 PM  There are no Patient Instructions on file for this visit.

## 2017-08-29 NOTE — Progress Notes (Signed)
Nursing Pain Medication Assessment:  Safety precautions to be maintained throughout the outpatient stay will include: orient to surroundings, keep bed in low position, maintain call bell within reach at all times, provide assistance with transfer out of bed and ambulation.  Medication Inspection Compliance: Pill count conducted under aseptic conditions, in front of the patient. Neither the pills nor the bottle was removed from the patient's sight at any time. Once count was completed pills were immediately returned to the patient in their original bottle.  Medication: Tramadol (Ultram) Pill/Patch Count: 20 of 120 pills remain Pill/Patch Appearance: Markings consistent with prescribed medication Bottle Appearance: Standard pharmacy container. Clearly labeled. Filled Date: 04/ 22/ 2019 Last Medication intake:  Today

## 2017-08-29 NOTE — Progress Notes (Signed)
She has a history of he did not have any heart problems patient's Name: Pamela Romero  MRN: [id number]  Referring Provider: Ronnell Freshwater, NP  DOB: 1950-09-19  PCP: Ronnell Freshwater, NP  DOS: 08/29/2017  Note by: Gillis Santa, MD  Service setting: Ambulatory outpatient  Specialty: Interventional Pain Management  Location: [facility] (AMB) Pain Management Facility  Visit type: Initial Patient Evaluation  Patient type: New Patient   Primary Reason(s) for Visit: Encounter for initial evaluation of one or more chronic problems (new to examiner) potentially causing chronic pain, and posing a threat to normal musculoskeletal function. (Level of risk: High) CC: Foot Pain (right); Ankle Pain (right); and Knee Pain (left)  HPI  Pamela Romero is a 67 y.o. year old, female patient, who comes today to see [place] for the first time for an initial evaluation of her chronic pain. 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; Hyperlipidemia LDL goal <100; Transient vision disturbance, bilateral; Unspecified constipation; Cough; Dysphagia; Obesity, unspecified; Annual physical exam; S/P breast biopsy; Pes planus of both feet; Shortness of breath; Cellulitis of left lower limb; Edema, unspecified; Inflammatory polyarthropathy (Pamela Romero); Major depressive disorder, recurrent, mild (Pamela Romero); Chronic kidney disease, unspecified; Anxiety disorder, unspecified; Nicotine dependence, cigarettes, uncomplicated; Acute pain of left knee; Plantar fascia syndrome; and Acute left-sided low back pain without sciatica on their problem list. Today she comes in for evaluation of her Foot Pain (right); Ankle Pain (right); and Knee Pain (left)  Pain Assessment: Location: Right Foot Radiating: radiates from foot to ankle Onset: More than a month ago Duration: Chronic pain Quality: Stabbing, Tingling,  Cramping Severity: 8 /10 (subjective, self-reported pain score)  Note: Reported level is inconsistent with clinical observations.                         When using our objective Pain Scale, levels between 6 and 10/10 are said to belong in an emergency room, as it progressively worsens from a 6/10, described as severely limiting, requiring emergency care not usually available at an outpatient pain management facility. At a 6/10 level, communication becomes difficult and requires great effort. Assistance to reach the emergency department may be required. Facial flushing and profuse sweating along with potentially dangerous increases in heart rate and blood pressure will be evident. Effect on ADL:   Timing: Constant Modifying factors: sitting with ice on foot BP: (!) 132/103  HR: 94  Onset and Duration: Gradual Cause of pain: Unknown Severity: Getting worse 8 out of 10 at its worst, 6 out of 10 at its best.  Currently 8 out of 10.  Most of the time 7 out of 10 Timing: Not influenced by any time the day Aggravating factors: Kneeling, sitting, twisting, walking Alleviating factors: Cold packs, medications, resting Associated problems: Daytime cramps, nighttime cramps, dizziness, nausea, sweating, swelling, tingling, vomiting, pain that wakes me up, pain that does not let me sleep Quality: Cramping, feeling of weight, hot, nagging, tender, throbbing, tingling Previous examinations or test: X-rays Previous treatments: Stretching exercises  The patient comes into the clinics today for the first time for a chronic pain management evaluation.   67 year old female with a history of hypothyroidism, endometrial cancer, acid reflux, obesity who presents with right foot and right ankle pain along with left knee pain.  This is been going on for over 3 years.  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  She has been on tramadol for many years.  She finds this medication  and effective in managing her pain.  Patient usually takes 100 mg 3 times a day as needed.  She states that over the last couple of years she has not noticed any improvement in her pain with this medication. Patient is tried right foot and ankle steroid injections which were not effective in the past.  She denies bowel or bladder dysfunction.   Today I took the time to provide the patient with information regarding my pain practice. The patient was informed that my practice is divided into two sections: an interventional pain management section, as well as a completely separate and distinct medication management section. I explained that I have procedure days for my interventional therapies, and evaluation days for follow-ups and medication management. Because of the amount of documentation required during both, they are kept separated. This means that there is the possibility that she may be scheduled for a procedure on one day, and medication management the next. I have also informed her that because of staffing and facility limitations, I no longer take patients for medication management only. To illustrate the reasons for this, I gave the patient the example of surgeons, and how inappropriate it would be to refer a patient to his/her care, just to write for the post-surgical antibiotics on a surgery done by a different surgeon.   Because interventional pain management is my board-certified specialty, the patient was informed that joining my practice means that they are open to any and all interventional therapies. I made it clear that this does not mean that they will be forced to have any procedures done. What this means is that I believe interventional therapies to be essential part of the diagnosis and proper management of chronic pain conditions. Therefore, patients not interested in these interventional alternatives will be better served under the care of a different practitioner.  The patient was  also made aware of my Comprehensive Pain Management Safety Guidelines where by joining my practice, they limit all of their nerve blocks and joint injections to those done by our practice, for as long as we are retained to manage their care.   Historic Controlled Substance Pharmacotherapy Review  PMP and historical list of controlled substances: Tramadol 100 mg 3 times daily as needed, quantity 180 a month, MME equals 30 Medications: Patient brought medications to be checked, as requested Pharmacodynamics: Desired effects: Analgesia: The patient reports <50% benefit. Reported improvement in function: The patient reports medication allows her to accomplish basic ADLs. Clinically meaningful improvement in function (CMIF): Sustained CMIF goals met Perceived effectiveness: Described as relatively effective but with some room for improvement Undesirable effects: Side-effects or Adverse reactions: None reported Historical Monitoring: The patient  reports that she does not use drugs. List of all UDS Test(s): No results found for: MDMA, COCAINSCRNUR, [place], [place], CANNABQUANT, [place], [place] List of other Serum/Urine Drug Screening Test(s):  No results found for: AMPHSCRSER, BARBSCRSER, BENZOSCRSER, COCAINSCRSER, COCAINSCRNUR, PCPSCRSER, PCPQUANT, THCSCRSER, THCU, CANNABQUANT, OPIATESCRSER, OXYSCRSER, PROPOXSCRSER, ETH Historical Background Evaluation: [place] PMP: Six (6) year initial data search conducted.             [place] Department of public safety, offender search: Editor, commissioning Information) Non-contributory Risk Assessment Profile: Aberrant behavior: None observed or detected today Risk factors for fatal opioid overdose: None identified today Fatal overdose hazard ratio (HR): Calculation deferred Non-fatal overdose hazard ratio (HR): Calculation deferred Risk of opioid abuse or  dependence: 0.7-3.0% with doses ? 36 MME/day and 6.1-26% with doses ? 120 MME/day. Substance use disorder (SUD) risk level:  Low Opioid risk tool (ORT) (Total Score): 2 Opioid Risk Tool - 08/29/17 0918      Family History of Substance Abuse   Alcohol  Positive Female    Illegal Drugs  Negative    Rx Drugs  Negative      Personal History of Substance Abuse   Alcohol  Negative    Illegal Drugs  Negative    Rx Drugs  Negative      Age   Age between 30-45 years   No      History of Preadolescent Sexual Abuse   History of Preadolescent Sexual Abuse  Negative or Female      Psychological Disease   Psychological Disease  Negative    Depression  Positive      Total Score   Opioid Risk Tool Scoring  2    Opioid Risk Interpretation  Low Risk      ORT Scoring interpretation table:  Score <3 = Low Risk for SUD  Score between 4-7 = Moderate Risk for SUD  Score >8 = High Risk for Opioid Abuse   PHQ-2 Depression Scale:  Total score: 0  PHQ-2 Scoring interpretation table: (Score and probability of major depressive disorder)  Score 0 = No depression  Score 1 = 15.4% Probability  Score 2 = 21.1% Probability  Score 3 = 38.4% Probability  Score 4 = 45.5% Probability  Score 5 = 56.4% Probability  Score 6 = 78.6% Probability   PHQ-9 Depression Scale:  Total score: 0  PHQ-9 Scoring interpretation table:  Score 0-4 = No depression  Score 5-9 = Mild depression  Score 10-14 = Moderate depression  Score 15-19 = Moderately severe depression  Score 20-27 = Severe depression (2.4 times higher risk of SUD and 2.89 times higher risk of overuse)   Pharmacologic Plan: As per protocol, I have not taken over any controlled substance management, pending the results of ordered tests and/or consults.            Initial impression: Pending review of available data and ordered tests.  Meds   Current Outpatient Medications:  .  docusate sodium (COLACE) 100 MG capsule, Take 100 mg by mouth 2 (two) times daily as needed., Disp: , Rfl:  .  esomeprazole (NEXIUM) 40 MG capsule, Take 1 capsule (40 mg total) by mouth daily.,  Disp: 30 capsule, Rfl: 3 .  levothyroxine (SYNTHROID, LEVOTHROID) 150 MCG tablet, Take 2 tablets (300 mcg total) by mouth daily., Disp: 60 tablet, Rfl: 3 .  lisinopril-hydrochlorothiazide (ZESTORETIC) 20-12.5 MG tablet, Take 1 tablet by mouth daily., Disp: 30 tablet, Rfl: 3 .  traMADol (ULTRAM) 50 MG tablet, Take 2 tablets (100 mg total) by mouth every 6 (six) hours as needed., Disp: 120 tablet, Rfl: 1 .  diclofenac (VOLTAREN) 75 MG EC tablet, Take 1 tablet (75 mg total) by mouth 2 (two) times daily., Disp: 60 tablet, Rfl: 1 .  DULoxetine (CYMBALTA) 20 MG capsule, Take 20 mg by mouth daily., Disp: , Rfl:  .  ranitidine (ZANTAC) 150 MG capsule, Take by mouth., Disp: , Rfl:  .  rosuvastatin (CRESTOR) 5 MG tablet, Take 5 mg by mouth at bedtime., Disp: , Rfl:    ROS  Cardiovascular: High blood pressure and Chest pain Pulmonary or Respiratory: Shortness of breath, Smoking and Snoring  Neurological: No reported neurological signs or symptoms such as seizures, abnormal skin  sensations, urinary and/or fecal incontinence, being born with an abnormal open spine and/or a tethered spinal cord Review of Past Neurological Studies: No results found for this or any previous visit. Psychological-Psychiatric: No reported psychological or psychiatric signs or symptoms such as difficulty sleeping, anxiety, depression, delusions or hallucinations (schizophrenial), mood swings (bipolar disorders) or suicidal ideations or attempts Gastrointestinal: Reflux or heatburn Genitourinary: No reported renal or genitourinary signs or symptoms such as difficulty voiding or producing urine, peeing blood, non-functioning kidney, kidney stones, difficulty emptying the bladder, difficulty controlling the flow of urine, or chronic kidney disease Hematological: No reported hematological signs or symptoms such as prolonged bleeding, low or poor functioning platelets, bruising or bleeding easily, hereditary bleeding problems, low energy  levels due to low hemoglobin or being anemic Endocrine: Slow thyroid Rheumatologic: No reported rheumatological signs and symptoms such as fatigue, joint pain, tenderness, swelling, redness, heat, stiffness, decreased range of motion, with or without associated rash Musculoskeletal: Negative for myasthenia gravis, muscular dystrophy, multiple sclerosis or malignant hyperthermia Work History: Working full time  [employer]  Drug: Ms. Dray  reports that she does not use drugs. Alcohol:  reports that she does not drink alcohol. Tobacco:  reports that she has been smoking.  She has never used smokeless tobacco. Medical:  has a past medical history of Abnormal EKG, CAD (coronary artery disease), Cancer ([place]), Endometrial adenocarcinoma ([place]) (2011), Hyperlipidemia, Hypertension, Osteoarthritis, Reflux gastritis, Thyroid disease, tobacco abuse, and Vaginal delivery. Family: family history includes Alcohol abuse in her father, maternal aunt, and mother; Cancer in her brother, brother, and maternal aunt; Heart disease in her brother.       Past Surgical History:  Procedure Laterality Date  . BREAST CYST ASPIRATION Right    neg  . BREAST SURGERY Right March 2014   benign [place]  . CARDIAC CATHETERIZATION  2006   40% LAD, EF 60%  . THYROIDECTOMY  2005   partial at [facility], non malignant tumor  . TOTAL ABDOMINAL HYSTERECTOMY     with BSO       Active Ambulatory Problems    Diagnosis Date Noted  . Osteoarthritis   . Endometrial cancer ([place])   . Tobacco abuse counseling 04/29/2011  . Hypothyroidism 04/29/2011  . Essential hypertension 04/29/2011  . Esophageal reflux 04/29/2011  . 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 Hyperlipidemia LDL goal <100 02/08/2012  . Transient vision disturbance, bilateral 04/02/2012  . Unspecified constipation 10/02/2012  . Cough 09/16/2013  . Dysphagia 09/16/2013  . Obesity, unspecified 12/18/2013  . Annual physical exam  12/18/2013  . S/P breast biopsy 12/18/2013  . Pes planus of both feet 12/18/2013  . Shortness of breath 04/17/2017  . Cellulitis of left lower limb 04/17/2017  . Edema, unspecified 04/17/2017  . Inflammatory polyarthropathy ([place]) 04/17/2017  . Major depressive disorder, recurrent, mild ([place]) 04/17/2017  . Chronic kidney disease, unspecified 04/17/2017  . Anxiety disorder, unspecified 04/17/2017  . Nicotine dependence, cigarettes, uncomplicated 72/90/2111  . Acute pain of left knee 07/17/2017  . Plantar fascia syndrome 07/17/2017  . Acute left-sided low back pain without sciatica 07/19/2017     Imaging Review  X-ray knee left 3 views5/01/2018 [place] Other Result Information  This result has an attachment that is not available.  Result Narrative  Knee Imaging: AP weightbearing of both knees, as well as lateral and merchant views of  the left knee are obtained.These films demonstrate moderate degenerative  changes, primarily involving the medial compartment with 90% medial joint  space narrowing.Overall alignment is mild varus.No fractures, lytic  lesions, or abnormal calcifications are noted.   X-ray foot right 1 view4/03/2017 [place] Result Impression  Inferior heel spur with arthritis midfoot  Result Narrative    COMPARISON: None  FINDINGS: Lateral of the left foot reveals an inferior spur off the calcaneus.Some  arthritic changes are noted in the dorsal midfoot region.No evidence of      BMI interpretation table: BMI level Category Range association with higher incidence of chronic pain  <18 kg/m2 Underweight   18.5-24.9 kg/m2 Ideal body weight   25-29.9 kg/m2 Overweight Increased incidence by 20%  30-34.9 kg/m2 Obese (Class I) Increased incidence by 68%  35-39.9 kg/m2 Severe obesity (Class II) Increased incidence by 136%  >40 kg/m2 Extreme obesity (Class III) Increased incidence by 254%   Patient's current  BMI Ideal Body weight  Body mass index is 32.23 kg/m. Ideal body weight: 63.9 kg (140 lb 14 oz) Adjusted ideal body weight: 76.8 kg (169 lb 5.2 oz)   BMI Readings from Last 4 Encounters:  08/29/17 32.23 kg/m  07/17/17 32.81 kg/m  06/29/17 34.67 kg/m  04/17/17 34.97 kg/m   Wt Readings from Last 4 Encounters:  08/29/17 212 lb (96.2 kg)  07/17/17 219 lb (99.3 kg)  06/29/17 228 lb (103.4 kg)  04/17/17 230 lb (104.3 kg)  Psych/Mental status: Alert, oriented x 3 (person, place, & time)       Eyes: PERLA Respiratory: No evidence of acute respiratory distress  Cervical Spine Area Exam  Skin & Axial Inspection: No masses, redness, edema, swelling, or associated skin lesions Alignment: Symmetrical Functional ROM: Unrestricted ROM      Stability: No instability detected Muscle Tone/Strength: Functionally intact. No obvious neuro-muscular anomalies detected. Sensory (Neurological): Unimpaired Palpation: No palpable anomalies              Upper Extremity (UE) Exam    Side: Right upper extremity  Side: Left upper extremity  Skin & Extremity Inspection: Skin color, temperature, and hair growth are WNL. No peripheral edema or cyanosis. No masses, redness, swelling, asymmetry, or associated skin lesions. No contractures.  Skin & Extremity Inspection: Skin color, temperature, and hair growth are WNL. No peripheral edema or cyanosis. No masses, redness, swelling, asymmetry, or associated skin lesions. No contractures.  Functional ROM: Unrestricted ROM          Functional ROM: Unrestricted ROM          Muscle Tone/Strength: Functionally intact. No obvious neuro-muscular anomalies detected.  Muscle Tone/Strength: Functionally intact. No obvious neuro-muscular anomalies detected.  Sensory (Neurological): Unimpaired          Sensory (Neurological): Unimpaired          Palpation: No palpable anomalies              Palpation: No palpable anomalies              Provocative Test(s):  Phalen's test:  deferred Tinel's test: deferred Apley's scratch test (touch opposite shoulder):  Action 1 (Across chest): deferred Action 2 (Overhead): deferred Action 3 (LB reach): deferred   Provocative Test(s):  Phalen's test: deferred Tinel's test: deferred Apley's scratch test (touch opposite shoulder):  Action 1 (Across chest): deferred Action 2 (Overhead): deferred Action 3 (LB reach): deferred    Thoracic Spine Area Exam  Skin & Axial Inspection: No masses, redness, or swelling Alignment: Symmetrical Functional ROM: Unrestricted ROM Stability: No instability detected Muscle Tone/Strength: Functionally intact. No obvious neuro-muscular anomalies detected. Sensory (Neurological):  Unimpaired Muscle strength & Tone: No palpable anomalies  Lumbar Spine Area Exam  Skin & Axial Inspection: No masses, redness, or swelling Alignment: Symmetrical Functional ROM: Unrestricted ROM       Stability: No instability detected Muscle Tone/Strength: Functionally intact. No obvious neuro-muscular anomalies detected. Sensory (Neurological): Unimpaired Palpation: No palpable anomalies       Provocative Tests: Lumbar Hyperextension/rotation test: deferred today       Lumbar quadrant test (Kemp's test): deferred today       Lumbar Lateral bending test: deferred today       Patrick's Maneuver: deferred today                   FABER test: deferred today       Thigh-thrust test: deferred today       S-I compression test: deferred today       S-I distraction test: deferred today        Gait & Posture Assessment  Ambulation: Unassisted Gait: Relatively normal for age and body habitus Posture: WNL   Lower Extremity Exam    Side: Right lower extremity  Side: Left lower extremity  Stability: No instability observed          Stability: No instability observed          Skin & Extremity Inspection: Skin color, temperature, and hair growth are WNL. No peripheral edema or cyanosis. No masses, redness, swelling,  asymmetry, or associated skin lesions. No contractures.  Skin & Extremity Inspection: Skin color, temperature, and hair growth are WNL. No peripheral edema or cyanosis. No masses, redness, swelling, asymmetry, or associated skin lesions. No contractures.  Functional ROM: Decreased ROM right foot and right ankle                  Functional ROM: Pain restricted ROM for knee joint          Muscle Tone/Strength: Functionally intact. No obvious neuro-muscular anomalies detected.  Muscle Tone/Strength: Functionally intact. No obvious neuro-muscular anomalies detected.  Sensory (Neurological): Arthropathic arthralgia  Sensory (Neurological): Arthropathic arthralgia  Palpation: Complains of area being tender to palpation  Palpation: No palpable anomalies   Assessment  Primary Diagnosis & Pertinent Problem List: The primary encounter diagnosis was Primary osteoarthritis of right ankle. Diagnoses of Plantar fascia syndrome, Class 1 obesity without serious comorbidity with body mass index (BMI) of 30.0 to 30.9 in adult, unspecified obesity type, Chronic pain syndrome, Chronic use of opiate for therapeutic purpose, and Pharmacologic therapy were also pertinent to this visit.  Visit Diagnosis (New problems to examiner): 1. Primary osteoarthritis of right ankle   2. Plantar fascia syndrome   3. Class 1 obesity without serious comorbidity with body mass index (BMI) of 30.0 to 30.9 in adult, unspecified obesity type   4. Chronic pain syndrome   5. Chronic use of opiate for therapeutic purpose   6. Pharmacologic therapy    General Recommendations: The pain condition that the patient suffers from is best treated with a multidisciplinary approach that involves an increase in physical activity to prevent de-conditioning and worsening of the pain cycle, as well as psychological counseling (formal and/or informal) to address the co-morbid psychological affects of pain. Treatment will often involve judicious use of  pain medications and interventional procedures to decrease the pain, allowing the patient to participate in the physical activity that will ultimately produce long-lasting pain reductions. The goal of the multidisciplinary approach is to return the patient to a higher level of overall function  and to restore their ability to perform activities of daily living.  67 year old female with a history of hypothyroidism, endometrial cancer, acid reflux, obesity who presents with right foot and right ankle pain along with left knee pain.  This is been going on for over 3 years.  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  She has been on tramadol for many years.  She finds this medication and effective in managing her pain.  Patient usually takes 100 mg 3 times a day as needed.  She states that over the last couple of years she has not noticed any improvement in her pain with this medication. Patient is tried right foot and ankle steroid injections which were not effective in the past.    Patient is primarily here for medication management and adjustment.  Given that tramadol has become less effective in the context of worsening osteoarthritis, we discussed alternative medications both opioid and non-opioid.  Patient found better benefit with a higher dose of diclofenac so we will increase to 75 mg twice daily.  I instructed the patient to take this after a meal and to keep yourself hydrated.  We will also complete UDS today which is standard for new patients.  Future opioid considerations could include hydrocodone, oxycodone, possible [place] patch.  I will also obtain baseline lab work today including CMP, magnesium, vitamin B12.  In regards to her left knee pain, we discussed left knee genicular nerve block with possible pulsed radio frequency ablation.    Plan of Care (Initial workup plan)  Note: Please be advised that as per protocol, today's visit has been an evaluation  only. We have not taken over the patient's controlled substance management.  Ordered Lab-work, Procedure(s), Referral(s), & Consult(s): Orders Placed This Encounter  Procedures  . Comprehensive metabolic panel  . Magnesium  . Vitamin B12   Pharmacotherapy (current): Medications ordered:  Meds ordered this encounter  Medications  . diclofenac (VOLTAREN) 75 MG EC tablet    Sig: Take 1 tablet (75 mg total) by mouth 2 (two) times daily.    Dispense:  60 tablet    Refill:  1   Medications administered during this visit: [place] had no medications administered during this visit.   Pharmacological management options:  Opioid Analgesics: The patient was informed that there is no guarantee that she would be a candidate for opioid analgesics. The decision will be made following CDC guidelines. This decision will be based on the results of diagnostic studies, as well as Ms. Effertz's risk profile.   Membrane stabilizer: To be determined at a later time  Muscle relaxant: To be determined at a later time  NSAID: To be determined at a later time  Other analgesic(s): To be determined at a later time   Provider-requested follow-up: Return in about 3 weeks (around 09/19/2017) for Medication Management.  Future Appointments  Date Time Provider [place]  09/01/2017 10:15 AM [facility]  09/14/2017  9:30 AM Ronnell Freshwater, NP [facility] None  09/21/2017  8:45 AM Gillis Santa, MD [facility] None    Primary Care Physician: Ronnell Freshwater, NP Location: [facility] Note by: Gillis Santa, M.D, Date: 08/29/2017; Time: 12:04 PM  There are no Patient Instructions on file for this visit.

## 2017-08-30 ENCOUNTER — Other Ambulatory Visit: Payer: Self-pay | Admitting: Student in an Organized Health Care Education/Training Program

## 2017-08-30 LAB — COMPREHENSIVE METABOLIC PANEL
ALBUMIN: 4.4 g/dL (ref 3.6–4.8)
ALT: 18 IU/L (ref 0–32)
AST: 15 IU/L (ref 0–40)
Albumin/Globulin Ratio: 2 (ref 1.2–2.2)
Alkaline Phosphatase: 126 IU/L — ABNORMAL HIGH (ref 39–117)
BILIRUBIN TOTAL: 0.2 mg/dL (ref 0.0–1.2)
BUN / CREAT RATIO: 17 (ref 12–28)
BUN: 19 mg/dL (ref 8–27)
CALCIUM: 9.3 mg/dL (ref 8.7–10.3)
CHLORIDE: 102 mmol/L (ref 96–106)
CO2: 25 mmol/L (ref 20–29)
CREATININE: 1.13 mg/dL — AB (ref 0.57–1.00)
GFR calc non Af Amer: 51 mL/min/{1.73_m2} — ABNORMAL LOW (ref >59)
GFR, EST AFRICAN AMERICAN: 59 mL/min/{1.73_m2} — AB (ref >59)
GLUCOSE: 75 mg/dL (ref 65–99)
Globulin, Total: 2.2 g/dL (ref 1.5–4.5)
Potassium: 3.8 mmol/L (ref 3.5–5.2)
Sodium: 141 mmol/L (ref 134–144)
TOTAL PROTEIN: 6.6 g/dL (ref 6.0–8.5)

## 2017-08-30 LAB — MAGNESIUM: Magnesium: 1.7 mg/dL (ref 1.6–2.3)

## 2017-08-30 LAB — VITAMIN B12: Vitamin B-12: 224 pg/mL — ABNORMAL LOW (ref 232–1245)

## 2017-09-01 ENCOUNTER — Ambulatory Visit
Admission: RE | Admit: 2017-09-01 | Discharge: 2017-09-01 | Disposition: A | Discharge status: Home / Self Care | Payer: BLUE CROSS/BLUE SHIELD | Source: Ambulatory Visit | Attending: Surgery | Admitting: Surgery

## 2017-09-01 DIAGNOSIS — M25562 Pain in left knee: Secondary | ICD-10-CM | POA: Diagnosis present

## 2017-09-01 DIAGNOSIS — M948X6 Other specified disorders of cartilage, lower leg: Secondary | ICD-10-CM | POA: Insufficient documentation

## 2017-09-01 DIAGNOSIS — M85662 Other cyst of bone, left lower leg: Secondary | ICD-10-CM | POA: Insufficient documentation

## 2017-09-01 DIAGNOSIS — X58XXXA Exposure to other specified factors, initial encounter: Secondary | ICD-10-CM | POA: Diagnosis not present

## 2017-09-01 DIAGNOSIS — S83282A Other tear of lateral meniscus, current injury, left knee, initial encounter: Secondary | ICD-10-CM | POA: Diagnosis not present

## 2017-09-01 DIAGNOSIS — S83242A Other tear of medial meniscus, current injury, left knee, initial encounter: Secondary | ICD-10-CM | POA: Diagnosis not present

## 2017-09-01 DIAGNOSIS — M1712 Unilateral primary osteoarthritis, left knee: Secondary | ICD-10-CM | POA: Diagnosis not present

## 2017-09-05 ENCOUNTER — Telehealth: Payer: Self-pay

## 2017-09-05 ENCOUNTER — Other Ambulatory Visit: Payer: Self-pay | Admitting: Nurse Practitioner

## 2017-09-05 DIAGNOSIS — M722 Plantar fascial fibromatosis: Secondary | ICD-10-CM

## 2017-09-05 MED ORDER — TRAMADOL HCL 50 MG PO TABS: 100.0000 mg | ORAL_TABLET | Freq: Four times a day (QID) | ORAL | 1 refills | Status: DC | PRN

## 2017-09-05 NOTE — Telephone Encounter (Signed)
Patient to have initial appointment with pain clinic in middle of June. Renewed current rx for tramadol, two tablets twice daily as needed, for 30 days with 1 refill.

## 2017-09-05 NOTE — Progress Notes (Signed)
Patient to have initial appointment with pain clinic in middle of June. Renewed current rx for tramadol, two tablets twice daily as needed, for 30 days with 1 refill.

## 2017-09-05 NOTE — Telephone Encounter (Signed)
Called and notified pt husband.

## 2017-09-07 LAB — COMPLIANCE DRUG ANALYSIS, UR

## 2017-09-14 ENCOUNTER — Encounter: Payer: Self-pay | Admitting: Nurse Practitioner

## 2017-09-14 ENCOUNTER — Ambulatory Visit: Payer: BLUE CROSS/BLUE SHIELD | Admitting: Nurse Practitioner

## 2017-09-14 VITALS — BP 132/86 | HR 83 | Resp 16 | Ht 68.5 in | Wt 223.6 lb

## 2017-09-14 DIAGNOSIS — F17210 Nicotine dependence, cigarettes, uncomplicated: Secondary | ICD-10-CM | POA: Diagnosis not present

## 2017-09-14 DIAGNOSIS — E039 Hypothyroidism, unspecified: Secondary | ICD-10-CM

## 2017-09-14 DIAGNOSIS — M25562 Pain in left knee: Secondary | ICD-10-CM

## 2017-09-14 DIAGNOSIS — I10 Essential (primary) hypertension: Secondary | ICD-10-CM

## 2017-09-14 DIAGNOSIS — G8929 Other chronic pain: Secondary | ICD-10-CM | POA: Diagnosis not present

## 2017-09-14 MED ORDER — LISINOPRIL 10 MG PO TABS: 10.0000 mg | ORAL_TABLET | Freq: Every day | ORAL | 3 refills | Status: DC

## 2017-09-14 NOTE — Progress Notes (Signed)
[facility] [place], [place] [address]  Internal MEDICINE  Office Visit Note  Patient Name: Pamela Romero  [id number]  [id number]  Date of Service: 09/14/2017   Pt is here for routine follow up.   Chief Complaint  Patient presents with  . Medication Management    blood pressure medication. pt took self off due to blood pressure dropping too low  . Knee Pain    left knee - has seen orthopedics     The patient's blood pressure medication was changed due to recall of losartan. Sent in prescription of lisinopril/HCTZ, comparable to dose of losartan/HCTZ she was already taking. She was feeling dizzy and weak. When she took her blood pressure, it was 90/58. She has stopped this medication. Blood pressure is only slightly elevated today without medication.  She has seen orthopedics and pain management due to chronic knee and foot pain. Her left knee is bone on bone. She has had MRI and will follow up with orthopedics tomorrow. Blood work and urine specimen were done at initial visit with pain management and she goes back to them on June 13.       Current Medication: Outpatient Encounter Medications as of 09/14/2017  Medication Sig Note  . diclofenac (VOLTAREN) 75 MG EC tablet Take 1 tablet (75 mg total) by mouth 2 (two) times daily.   [name] docusate sodium (COLACE) 100 MG capsule Take 100 mg by mouth 2 (two) times daily as needed.   . DULoxetine (CYMBALTA) 20 MG capsule Take 20 mg by mouth daily.   [name] esomeprazole (NEXIUM) 40 MG capsule Take 1 capsule (40 mg total) by mouth daily.   [name] levothyroxine (SYNTHROID, LEVOTHROID) 150 MCG tablet Take 2 tablets (300 mcg total) by mouth daily.   . ranitidine (ZANTAC) 150 MG capsule Take by mouth.   . rosuvastatin (CRESTOR) 5 MG tablet Take 5 mg by mouth at bedtime.   . traMADol (ULTRAM) 50 MG tablet Take 2 tablets (100 mg total) by mouth every 6 (six) hours as needed.   . [DISCONTINUED] lisinopril-hydrochlorothiazide  (ZESTORETIC) 20-12.5 MG tablet Take 1 tablet by mouth daily. 09/14/2017: caused low blood pressure reading.   [name] lisinopril (PRINIVIL,ZESTRIL) 10 MG tablet Take 1 tablet (10 mg total) by mouth daily.    No facility-administered encounter medications on file as of 09/14/2017.     Surgical History: Past Surgical History:  Procedure Laterality Date  . BREAST CYST ASPIRATION Right    neg  . BREAST SURGERY Right March 2014   benign [place]  . CARDIAC CATHETERIZATION  2006   40% LAD, EF 60%  . THYROIDECTOMY  2005   partial at [facility], non malignant tumor  . TOTAL ABDOMINAL HYSTERECTOMY     with BSO    Medical History: Past Medical History:  Diagnosis Date  . Abnormal EKG   . CAD (coronary artery disease)    non critical, by cardiac cath 2006  . Cancer ([place])    endometrial Ca  . Endometrial adenocarcinoma ([place]) 2011  . Hyperlipidemia   . Hypertension   . Osteoarthritis   . Reflux gastritis   . Thyroid disease   . tobacco abuse   . Vaginal delivery    x 2    Family History: Family History  Problem Relation Age of Onset  . Alcohol abuse Father   . Cancer Maternal Aunt        stomach  . Alcohol abuse Maternal Aunt   . Alcohol abuse Mother   .  Heart disease Brother        MI age 61- non smoker  . Cancer Brother        colon, dx'd age 51  . Cancer Brother        stomach, paternal half brother  . Breast cancer Neg Hx     Social History   Socioeconomic History  . Marital status: Married    Spouse name: Not on file  . Number of children: 0  . Years of education: Not on file  . Highest education level: Not on file  Occupational History  . Occupation: Public affairs consultant- full time  . Occupation: Administrator, arts- part time  Social Needs  . Financial resource strain: Not on file  . Food insecurity:    Worry: Not on file    Inability: Not on file  . Transportation needs:    Medical: Not on file    Non-medical: Not on file  Tobacco Use  . Smoking status: Current Every Day Smoker    . Smokeless tobacco: Never Used  . Tobacco comment: 2-3 cigs a day  Substance and Sexual Activity  . Alcohol use: No  . Drug use: No  . Sexual activity: Not on file  Lifestyle  . Physical activity:    Days per week: Not on file    Minutes per session: Not on file  . Stress: Not on file  Relationships  . Social connections:    Talks on phone: Not on file    Gets together: Not on file    Attends religious service: Not on file    Active member of club or organization: Not on file    Attends meetings of clubs or organizations: Not on file    Relationship status: Not on file  . Intimate partner violence:    Fear of current or ex partner: Not on file    Emotionally abused: Not on file    Physically abused: Not on file    Forced sexual activity: Not on file  Other Topics Concern  . Not on file  Social History Narrative   Lives with spouse, takes care of her mother.   Had 2 children, one died at birth, the other given up for adoption (was unmarried).      Review of Systems  Constitutional: Positive for activity change. Negative for fatigue.  HENT: Negative for ear pain and hearing loss.        Ringing in the ears.   Eyes: Negative.   Respiratory: Negative for cough and wheezing.   Cardiovascular: Negative for chest pain and palpitations.       Blood pressure improved.   Gastrointestinal: Negative.  Negative for abdominal pain, diarrhea, nausea and vomiting.  Endocrine:       Has been taking her higher dose of levothyroxine every day. Will have blood work redrawn next month.   Genitourinary: Negative.   Musculoskeletal: Positive for arthralgias and myalgias. Negative for back pain.       Left knee and right foot. Pain getting worse and more difficult to keep active.   Skin: Negative for rash.  Allergic/Immunologic: Negative.   Neurological: Positive for headaches.  Hematological: Negative for adenopathy.  Psychiatric/Behavioral: The patient is nervous/anxious.      Today's Vitals   09/14/17 0830  BP: 132/86  Pulse: 83  Resp: 16  SpO2: 99%  Weight: 223 lb 9.6 oz (101.4 kg)  Height: 5' 8.5" (1.74 m)    Physical Exam  Constitutional: She is oriented to person,  place, and time. She appears well-developed and well-nourished.  HENT:  Head: Normocephalic and atraumatic.  Right Ear: External ear normal.  Left Ear: External ear normal.  Eyes: Pupils are equal, round, and reactive to light.  Neck: Normal range of motion. Neck supple. No thyromegaly present.  Cardiovascular: Normal rate, regular rhythm and normal heart sounds.  Pulmonary/Chest: Effort normal and breath sounds normal. She has no wheezes. She exhibits no tenderness.  Abdominal: Soft. Bowel sounds are normal. There is no tenderness.  Musculoskeletal:       Legs:      Feet:  Moderate to severe left knee pain. Pain is most severe along inferior aspect of the patella. Mild/moderate swelling noted.   Lymphadenopathy:    She has no cervical adenopathy.  Neurological: She is alert and oriented to person, place, and time.  Skin: Skin is warm and dry.  Psychiatric: She has a normal mood and affect. Her behavior is normal. Judgment and thought content normal.  Nursing note and vitals reviewed.  Assessment/Plan: 1. Essential hypertension D/c lisinopril/HCTZ and start lisinopril 10mg  daily. Limit salt and increase water in the diet.  - lisinopril (PRINIVIL,ZESTRIL) 10 MG tablet; Take 1 tablet (10 mg total) by mouth daily.  Dispense: 30 tablet; Refill: 3  2. Acquired hypothyroidism Check thyroid panel and adjust levothyroxine dose as indicated. May need referral to endocrinology.   3. Chronic pain of left knee Follow up with orthopedics and pain management as scheduled.   4. Nicotine dependence, cigarettes, uncomplicated Continue to recommend smoking cessation.   General Counseling: Abrial verbalizes understanding of the findings of todays visit and agrees with plan of treatment. I  have discussed any further diagnostic evaluation that may be needed or ordered today. We also reviewed her medications today. she has been encouraged to call the office with any questions or concerns that should arise related to todays visit.    Counseling:  Hypertension Counseling:   The following hypertensive lifestyle modification were recommended and discussed:  1. Limiting alcohol intake to less than 1 oz/day of ethanol:(24 oz of beer or 8 oz of wine or 2 oz of 100-proof whiskey). 2. Take baby ASA 81 mg daily. 3. Importance of regular aerobic exercise and losing weight. 4. Reduce dietary saturated fat and cholesterol intake for overall cardiovascular health. 5. Maintaining adequate dietary potassium, calcium, and magnesium intake. 6. Regular monitoring of the blood pressure. 7. Reduce sodium intake to less than 100 mmol/day (less than 2.3 gm of sodium or less than 6 gm of sodium choride)   This patient was seen by Leretha Pol, FNP- C in Collaboration with Dr Lavera Guise as a part of collaborative care agreement  Meds ordered this encounter  Medications  . lisinopril (PRINIVIL,ZESTRIL) 10 MG tablet    Sig: Take 1 tablet (10 mg total) by mouth daily.    Dispense:  30 tablet    Refill:  3    Order Specific Question:   Supervising Provider    Answer:   Lavera Guise [[id number]]    Time spent: 70 Minutes      Dr Lavera Guise Internal medicine

## 2017-09-15 DIAGNOSIS — M23301 Other meniscus derangements, unspecified lateral meniscus, left knee: Secondary | ICD-10-CM | POA: Insufficient documentation

## 2017-09-21 ENCOUNTER — Ambulatory Visit
Payer: BLUE CROSS/BLUE SHIELD | Attending: Student in an Organized Health Care Education/Training Program | Admitting: Student in an Organized Health Care Education/Training Program

## 2017-09-21 ENCOUNTER — Other Ambulatory Visit: Payer: Self-pay

## 2017-09-21 ENCOUNTER — Encounter: Payer: Self-pay | Admitting: Student in an Organized Health Care Education/Training Program

## 2017-09-21 VITALS — BP 144/97 | HR 86 | Temp 98.0°F | Resp 16 | Ht 68.0 in | Wt 219.0 lb

## 2017-09-21 DIAGNOSIS — Z9889 Other specified postprocedural states: Secondary | ICD-10-CM | POA: Insufficient documentation

## 2017-09-21 DIAGNOSIS — Z8249 Family history of ischemic heart disease and other diseases of the circulatory system: Secondary | ICD-10-CM | POA: Insufficient documentation

## 2017-09-21 DIAGNOSIS — Z79891 Long term (current) use of opiate analgesic: Secondary | ICD-10-CM | POA: Diagnosis not present

## 2017-09-21 DIAGNOSIS — R1310 Dysphagia, unspecified: Secondary | ICD-10-CM | POA: Insufficient documentation

## 2017-09-21 DIAGNOSIS — Z6830 Body mass index (BMI) 30.0-30.9, adult: Secondary | ICD-10-CM

## 2017-09-21 DIAGNOSIS — F17200 Nicotine dependence, unspecified, uncomplicated: Secondary | ICD-10-CM | POA: Insufficient documentation

## 2017-09-21 DIAGNOSIS — K5900 Constipation, unspecified: Secondary | ICD-10-CM | POA: Insufficient documentation

## 2017-09-21 DIAGNOSIS — S83242A Other tear of medial meniscus, current injury, left knee, initial encounter: Secondary | ICD-10-CM | POA: Diagnosis not present

## 2017-09-21 DIAGNOSIS — F17210 Nicotine dependence, cigarettes, uncomplicated: Secondary | ICD-10-CM | POA: Insufficient documentation

## 2017-09-21 DIAGNOSIS — Z8542 Personal history of malignant neoplasm of other parts of uterus: Secondary | ICD-10-CM | POA: Insufficient documentation

## 2017-09-21 DIAGNOSIS — I129 Hypertensive chronic kidney disease with stage 1 through stage 4 chronic kidney disease, or unspecified chronic kidney disease: Secondary | ICD-10-CM | POA: Diagnosis not present

## 2017-09-21 DIAGNOSIS — F419 Anxiety disorder, unspecified: Secondary | ICD-10-CM | POA: Insufficient documentation

## 2017-09-21 DIAGNOSIS — R05 Cough: Secondary | ICD-10-CM | POA: Diagnosis not present

## 2017-09-21 DIAGNOSIS — L03116 Cellulitis of left lower limb: Secondary | ICD-10-CM | POA: Insufficient documentation

## 2017-09-21 DIAGNOSIS — E785 Hyperlipidemia, unspecified: Secondary | ICD-10-CM | POA: Diagnosis not present

## 2017-09-21 DIAGNOSIS — M19071 Primary osteoarthritis, right ankle and foot: Secondary | ICD-10-CM | POA: Diagnosis not present

## 2017-09-21 DIAGNOSIS — M2141 Flat foot [pes planus] (acquired), right foot: Secondary | ICD-10-CM | POA: Diagnosis not present

## 2017-09-21 DIAGNOSIS — I2510 Atherosclerotic heart disease of native coronary artery without angina pectoris: Secondary | ICD-10-CM | POA: Insufficient documentation

## 2017-09-21 DIAGNOSIS — M722 Plantar fascial fibromatosis: Secondary | ICD-10-CM | POA: Insufficient documentation

## 2017-09-21 DIAGNOSIS — S83282A Other tear of lateral meniscus, current injury, left knee, initial encounter: Secondary | ICD-10-CM | POA: Insufficient documentation

## 2017-09-21 DIAGNOSIS — G894 Chronic pain syndrome: Secondary | ICD-10-CM | POA: Diagnosis not present

## 2017-09-21 DIAGNOSIS — Z79899 Other long term (current) drug therapy: Secondary | ICD-10-CM | POA: Diagnosis not present

## 2017-09-21 DIAGNOSIS — M1712 Unilateral primary osteoarthritis, left knee: Secondary | ICD-10-CM

## 2017-09-21 DIAGNOSIS — Z760 Encounter for issue of repeat prescription: Secondary | ICD-10-CM | POA: Diagnosis present

## 2017-09-21 DIAGNOSIS — R0602 Shortness of breath: Secondary | ICD-10-CM | POA: Diagnosis not present

## 2017-09-21 DIAGNOSIS — N189 Chronic kidney disease, unspecified: Secondary | ICD-10-CM | POA: Diagnosis not present

## 2017-09-21 DIAGNOSIS — E890 Postprocedural hypothyroidism: Secondary | ICD-10-CM | POA: Insufficient documentation

## 2017-09-21 DIAGNOSIS — E669 Obesity, unspecified: Secondary | ICD-10-CM | POA: Insufficient documentation

## 2017-09-21 DIAGNOSIS — E538 Deficiency of other specified B group vitamins: Secondary | ICD-10-CM | POA: Insufficient documentation

## 2017-09-21 DIAGNOSIS — X58XXXA Exposure to other specified factors, initial encounter: Secondary | ICD-10-CM | POA: Insufficient documentation

## 2017-09-21 DIAGNOSIS — M25571 Pain in right ankle and joints of right foot: Secondary | ICD-10-CM | POA: Insufficient documentation

## 2017-09-21 DIAGNOSIS — K219 Gastro-esophageal reflux disease without esophagitis: Secondary | ICD-10-CM | POA: Insufficient documentation

## 2017-09-21 DIAGNOSIS — Z809 Family history of malignant neoplasm, unspecified: Secondary | ICD-10-CM | POA: Insufficient documentation

## 2017-09-21 DIAGNOSIS — Z90710 Acquired absence of both cervix and uterus: Secondary | ICD-10-CM | POA: Insufficient documentation

## 2017-09-21 DIAGNOSIS — Z811 Family history of alcohol abuse and dependence: Secondary | ICD-10-CM | POA: Insufficient documentation

## 2017-09-21 MED ORDER — HYDROCODONE-ACETAMINOPHEN 5-325 MG PO TABS: 1.0000 | ORAL_TABLET | Freq: Four times a day (QID) | ORAL | 0 refills | Status: DC | PRN

## 2017-09-21 NOTE — Patient Instructions (Addendum)
You were given 2 prescriptions for Hydrocodone today.  ____________________________________________________________________________________________  Genicular Nerve Block  What is a genicular nerve block? A genicular nerve block is the injection of a local anesthetic to block the nerves that transmits pain from the knee.  What is the purpose of a facet nerve block? A genicular nerve block is a diagnostic procedure to determine if the pathologic changes (i.e. arthritis, meniscal tears, etc) and inflammation within the knee joint is the source of your knee pain. It also confirms that the knee pain will respond well to the actual treatment procedure. If a genicular nerve block works, it will give you relief for several hours. After that, the pain is expected to return to normal. This test is always performed twice (usually a week or two apart) because two successful tests are required to move onto treatment. If both diagnostic tests are positive, then we schedule a treatment called radiofrequency (RF) ablation. In this procedure, the same nerves are cauterized, which typically leads to pain relief for 4 -18 months. If this process works well for one knee, it can be performed on the other knee if needed.  How is the procedure performed? You will be placed on the procedure table. The injection site is sterilized with either iodine or chlorhexadine. The site to be injected is numbed with a local anesthetic, and a needle is directed to the target area. X-ray guidance is used to ensure proper placement and positioning of the needle. When the needle is properly positioned near the genicular nerve, local anesthetic is injected to numb that nerve. This will be repeated at multiple sites around the knee to block all genicular nerves.  Will the procedure be painful? The injection can be painful and we therefore provide the option of receiving IV sedation. IV sedation, combined with local anesthetic, can make the  injection nearly pain free. It allows you to remain very still during the procedure, which can also make the injection easier, faster, and more successful. If you decide to have IV sedation, you must have a driver to get you home safely afterwards. In addition, you cannot have anything to eat or drink within 8 hours of your appointment (clear liquids are allowed until 3 hours before the procedure). If you take medications for diabetes, these medications may need to be adjusted the morning of the procedure. Your primary care physician can help you with this adjustment.  What are the discharge instructions? If you received IV sedation do not drive or operate machinery for at least 24 hours after the procedure. You may return to work the next day following your procedure. You may resume your normal diet immediately. Do not engage in any strenuous activity for 24 hours. You should, however, engage in moderate activity that typically causes your ususal pain. If the block works, those activities should not be painful for several hours after the injection. Do not take a bath, swim, or use a hot tub for 24 hours (you may take a shower). Call the office if you have any of the following: severe pain afterwards (different than your usual symptoms), redness/swelling/discharge at the injection site(s), fevers/chills, difficulty with bowel or bladder functions.  What are the risks and side effects? The complication rate for this procedure is very low. Whenever a needle enters the skin, bleeding or infection can occur. Some other serious but extremely rare risks include paralysis and death. You may have an allergic reaction to any of the medications used. If you have a  known allergy to any medications, especially local anesthetics, notify our staff before the procedure takes place. You may experience any of the following side effects up to 4 - 6 hours after the procedure: . Leg muscle weakness or numbness may occur due to  the local anesthetic affecting the nerves that control your legs (this is a temporary affect and it is not paralysis). If you have any leg weakness or numbness, walk only with assistance in order to prevent falls and injury. Your leg strength will return slowly and completely. . Dizziness may occur due to a decrease in your blood pressure. If this occurs, remain in a seated or lying position. Gradually sit up, and then stand after at least 10 minutes of sitting. . Mild headaches may occur. Drink fluids and take pain medications if needed. If the headaches persist or become severe, call the office. . Mild discomfort at the injection site can occur. This typically lasts for a few hours but can persist for a couple days. If this occurs, take anti-inflammatories or pain medications, apply ice to the area the day of the procedure. If it persists, apply moist heat in the day(s) following.  The side effects listed above can be normal. They are not dangerous and will resolve on their own. If, however, you experience any of the following, a complication may have occurred and you should either contact your doctor. If he is not readily available, then you should proceed to the closest urgent care center for evaluation: . Severe or progressive pain at the injection site(s) . Arm or leg weakness that progressively worsens or persists for longer than 8 hours . Severe or progressive redness, swelling, or discharge from the injections site(s) . Fevers, chills, nausea, or vomiting . Bowel or bladder dysfunction (i.e. inability to urinate or pass stool or difficulty controlling either)  How long does it take for the procedure to work? You should feel relief from your usual pain within the first hour. Again, this is only expected to last for several hours, at the most. Remember, you may be sore in the middle part of your back from the needles, and you must distinguish this from your usual  pain. ____________________________________________________________________________________________

## 2017-09-21 NOTE — Progress Notes (Signed)
Patient's Name: Pamela Romero  MRN: [id number]  Referring Provider: Ronnell Freshwater, NP  DOB: [birth year]  PCP: Ronnell Freshwater, NP  DOS: 09/21/2017  Note by: Gillis Santa, MD  Service setting: Ambulatory outpatient  Specialty: Interventional Pain Management  Location: [facility] (AMB) Pain Management Facility    Patient type: Established   Primary Reason(s) for Visit: Encounter for prescription drug management. (Level of risk: moderate)  CC: Medication Refill  HPI  [name] is a 67 y.o. year old, female patient, who comes today for a medication management evaluation. 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-inflammatory drug; Hyperlipidemia LDL goal <100; Transient vision disturbance, bilateral; Unspecified constipation; Cough; Dysphagia; Obesity, unspecified; Annual physical exam; S/P breast biopsy; Pes planus of both feet; Shortness of breath; Cellulitis of left lower limb; Edema, unspecified; Inflammatory polyarthropathy ([place]); Major depressive disorder, recurrent, mild ([place]); Chronic kidney disease, unspecified; Anxiety disorder, unspecified; Nicotine dependence, cigarettes, uncomplicated; Chronic pain of left knee; Plantar fascia syndrome; and Acute left-sided low back pain without sciatica on their problem list. Her primarily concern today is the Medication Refill  Pain Assessment: Location: Right Foot(right) Radiating: Radiates from ankle to foot Onset: More than a month ago Duration: Chronic pain Quality: Stabbing, Tingling, Cramping Severity: 6 /10 (subjective, self-reported pain score)  Note: Reported level is inconsistent with clinical observations.                         When using our objective Pain Scale, levels between 6 and 10/10 are said to belong in an emergency room, as it progressively worsens from a 6/10, described as severely limiting, requiring  emergency care not usually available at an outpatient pain management facility. At a 6/10 level, communication becomes difficult and requires great effort. Assistance to reach the emergency department may be required. Facial flushing and profuse sweating along with potentially dangerous increases in heart rate and blood pressure will be evident. Effect on ADL: Affectes with walking, working, overall activities  Timing: Constant Modifying factors: Sitting foot in ice BP: (!) 144/97  HR: 86  [name] was last scheduled for an appointment on 08/29/2017 for medication management. During today's appointment we reviewed [name]'s chronic pain status, as well as her outpatient medication regimen.  Patient returns today for her second patient visit.  Her UDS was reviewed and appropriate. She does find some benefit with diclofenac in regards to her knee pain.  Lab results were reviewed as below.  The patient  reports that she does not use drugs. Her body mass index is 33.3 kg/m.  Further details on both, my assessment(s), as well as the proposed treatment plan, please see below.  Controlled Substance Pharmacotherapy Assessment REMS (Risk Evaluation and Mitigation Strategy)  Analgesic: We will start hydrocodone 5 mg 3 times daily as needed, quantity 90/month MME/day: 15 mg/day.  Pamela Napoleon, RN  09/21/2017  8:41 AM  Sign at close encounter Safety precautions to be maintained throughout the outpatient stay will include: orient to surroundings, keep bed in low position, maintain call bell within reach at all times, provide assistance with transfer out of bed and ambulation.    Pharmacokinetics: Liberation and absorption (onset of action): WNL Distribution (time to peak effect): WNL Metabolism and excretion (duration of action): WNL         Pharmacodynamics: Desired effects: Analgesia: [name] reports >50% benefit. Functional ability: Patient reports that medication allows  her to  accomplish basic ADLs Clinically meaningful improvement in function (CMIF): Sustained CMIF goals met Perceived effectiveness: Described as relatively effective, allowing for increase in activities of daily living (ADL) Undesirable effects: Side-effects or Adverse reactions: None reported Monitoring: [place] PMP: Online review of the past 67-monthperiod conducted. Compliant with practice rules and regulations Last UDS on record: Summary  Date Value Ref Range Status  08/30/2017 FINAL  Final    Comment:    ==================================================================== TOXASSURE COMP DRUG ANALYSIS,UR ==================================================================== Test                             Result       Flag       Units Drug Present and Declared for Prescription Verification   Tramadol                       >1479        EXPECTED   ng/mg creat   O-Desmethyltramadol            >1479        EXPECTED   ng/mg creat   N-Desmethyltramadol            >1479        EXPECTED   ng/mg creat    Source of tramadol is a prescription medication.    O-desmethyltramadol and N-desmethyltramadol are expected    metabolites of tramadol.   Diclofenac                     PRESENT      EXPECTED Drug Present not Declared for Prescription Verification   Acetaminophen                  PRESENT      UNEXPECTED Drug Absent but Declared for Prescription Verification   Duloxetine                     Not Detected UNEXPECTED ==================================================================== Test                      Result    Flag   Units      Ref Range   Creatinine              338              mg/dL      >=20 ==================================================================== Declared Medications:  The flagging and interpretation on this report are based on the  following declared medications.  Unexpected results may arise from  inaccuracies in the declared medications.  **Note: The testing scope of  this panel includes these medications:  Duloxetine  Tramadol  **Note: The testing scope of this panel does not include small to  moderate amounts of these reported medications:  Diclofenac  **Note: The testing scope of this panel does not include following  reported medications:  Docusate (Colace)  Hydrochlorothiazide (Zestoretic)  Levothyroxine (Synthroid)  Lisinopril (Zestoretic)  Omeprazole (Nexium)  Ranitidine  Rosuvastatin ==================================================================== For clinical consultation, please call ([phone] ====================================================================    UDS interpretation: Compliant          Medication Assessment Form: Reviewed. Patient indicates being compliant with therapy Treatment compliance: Compliant Risk Assessment Profile: Aberrant behavior: See prior evaluations. None observed or detected today Comorbid factors increasing risk of overdose: See prior notes. No additional risks detected today Risk of substance use disorder (SUD): Low Opioid Risk  Tool - 09/21/17 0841      Family History of Substance Abuse   Alcohol  Positive Female    Illegal Drugs  Negative    Rx Drugs  Negative      Personal History of Substance Abuse   Alcohol  Negative    Illegal Drugs  Negative    Rx Drugs  Negative      Age   Age between 9-45 years   No      History of Preadolescent Sexual Abuse   History of Preadolescent Sexual Abuse  Negative or Female      Psychological Disease   Psychological Disease  Negative    Depression  Positive      Total Score   Opioid Risk Tool Scoring  2    Opioid Risk Interpretation  Low Risk      ORT Scoring interpretation table:  Score <3 = Low Risk for SUD  Score between 4-7 = Moderate Risk for SUD  Score >8 = High Risk for Opioid Abuse   Risk Mitigation Strategies:  Patient Counseling: Completed today. Counseling provided to patient as per "Patient Counseling Document". Document  signed by patient, attesting to counseling and understanding Patient-Prescriber Agreement (PPA): Obtained today  Notification to other healthcare providers: Written and sent today  Pharmacologic Plan: Today we will take over the chronic pain medication management and from this point on our medication agreement with this patient is active.             Laboratory Chemistry  Inflammation Markers (CRP: Acute Phase) (ESR: Chronic Phase) Lab Results  Component Value Date   ESRSEDRATE 19 03/30/2012                         Rheumatology Markers No results found for: RF, ANA, LABURIC, URICUR, LYMEIGGIGMAB, LYMEABIGMQN, HLAB27                      Renal Function Markers Lab Results  Component Value Date   BUN 19 08/29/2017   CREATININE 1.13 (H) 08/29/2017   BCR 17 08/29/2017   GFRAA 59 (L) 08/29/2017   GFRNONAA 51 (L) 08/29/2017                             Hepatic Function Markers Lab Results  Component Value Date   AST 15 08/29/2017   ALT 18 08/29/2017   ALBUMIN 4.4 08/29/2017   ALKPHOS 126 (H) 08/29/2017   HCVAB NEGATIVE 01/21/2015                        Electrolytes Lab Results  Component Value Date   NA 141 08/29/2017   K 3.8 08/29/2017   CL 102 08/29/2017   CALCIUM 9.3 08/29/2017   MG 1.7 08/29/2017                        Neuropathy Markers Lab Results  Component Value Date   VITAMINB12 224 (L) 08/29/2017   HGBA1C 4.8 01/21/2015   HIV NONREACTIVE 01/21/2015                        Bone Pathology Markers Lab Results  Component Value Date   VD25OH 26.66 (L) 05/14/2014  Coagulation Parameters Lab Results  Component Value Date   PLT 127.0 (L) 09/16/2013                        Cardiovascular Markers Lab Results  Component Value Date   HGB 12.1 09/16/2013   HCT 36.3 09/16/2013                         CA Markers No results found for: CEA, CA125, LABCA2                      Note: Lab results reviewed.  Recent Diagnostic Imaging  Results  MR KNEE LEFT WO CONTRAST CLINICAL DATA:  Persistent anterior knee pain for 1.5 months.  EXAM: MRI OF THE LEFT KNEE WITHOUT CONTRAST  TECHNIQUE: Multiplanar, multisequence MR imaging of the knee was performed. No intravenous contrast was administered.  COMPARISON:  None.  FINDINGS: MENISCI  Medial meniscus: There is an extensive horizontal tear posterior horn extending from the superior surface to the periphery. The midbody is degenerated and peripherally subluxed.  Lateral meniscus: Extensive horizontal tear involving the anterior horn and midbody with small parameniscal cysts at the level of the midbody best seen on image 34 of series 7 and series 31 of series 5.  LIGAMENTS  Cruciates:  Intact.  Collaterals:  Intact.  CARTILAGE  Patellofemoral: Partial-thickness cartilage loss of the superior aspect of the medial facet of the patella.  Medial: Denuding of the articular cartilage of the medial tibial plateau with subcortical edema and subcortical cyst formation in the periphery of the tibial plateau. Thinning of the articular cartilage of the femoral condyle.  Lateral: Focal full-thickness cartilage loss in the posterior aspect of the lateral tibial plateau.  Joint: Trace joint effusion. Normal Hoffa's fat pad. No plical thickening.  Popliteal Fossa:  Small Baker's cyst.  Intact popliteus tendon.  Extensor Mechanism:  Normal.  Bones: Moderate marginal osteophytes in the medial compartment. Tiny marginal osteophytes in the lateral compartment.  Other: None  IMPRESSION: 1. Extensive horizontal tear of the posterior horn of the medial meniscus. 2. Extensive horizontal tear of the anterior horn and midbody of the lateral meniscus. 3. Moderate osteoarthritis of the medial compartment with full-thickness cartilage loss and subcortical edema and subcortical cyst formation in the medial tibial plateau.  Electronically Signed   By: Lorriane Shire  M.D.   On: 09/01/2017 10:59  Complexity Note: Imaging results reviewed. Results shared with Ms. Sachs, using Layman's terms.                         Meds   Current Outpatient Medications:  .  docusate sodium (COLACE) 100 MG capsule, Take 100 mg by mouth 2 (two) times daily as needed., Disp: , Rfl:  .  DULoxetine (CYMBALTA) 20 MG capsule, Take 20 mg by mouth daily., Disp: , Rfl:  .  esomeprazole (NEXIUM) 40 MG capsule, Take 1 capsule (40 mg total) by mouth daily., Disp: 30 capsule, Rfl: 3 .  levothyroxine (SYNTHROID, LEVOTHROID) 150 MCG tablet, Take 2 tablets (300 mcg total) by mouth daily., Disp: 60 tablet, Rfl: 3 .  lisinopril (PRINIVIL,ZESTRIL) 10 MG tablet, Take 1 tablet (10 mg total) by mouth daily., Disp: 30 tablet, Rfl: 3 .  ranitidine (ZANTAC) 150 MG capsule, Take by mouth., Disp: , Rfl:  .  traMADol (ULTRAM) 50 MG tablet, Take 2 tablets (100 mg total) by mouth every  6 (six) hours as needed., Disp: 120 tablet, Rfl: 1 .  HYDROcodone-acetaminophen (NORCO/VICODIN) 5-325 MG tablet, Take 1 tablet by mouth every 6 (six) hours as needed for moderate pain. For chronic pain To last for 30 days from fill date  To fill on or after: 09/21/17, 10/20/17, Disp: 90 tablet, Rfl: 0 .  rosuvastatin (CRESTOR) 5 MG tablet, Take 5 mg by mouth at bedtime., Disp: , Rfl:   ROS  Constitutional: Denies any fever or chills Gastrointestinal: No reported hemesis, hematochezia, vomiting, or acute GI distress Musculoskeletal: Denies any acute onset joint swelling, redness, loss of ROM, or weakness Neurological: No reported episodes of acute onset apraxia, aphasia, dysarthria, agnosia, amnesia, paralysis, loss of coordination, or loss of consciousness  Allergies  Ms. Defrain has No Known Allergies.  [place]  Drug: Ms. Razzano  reports that she does not use drugs. Alcohol:  reports that she does not drink alcohol. Tobacco:  reports that she has been smoking.  She has never used smokeless tobacco. Medical:  has  a past medical history of Abnormal EKG, CAD (coronary artery disease), Cancer ([place]), Endometrial adenocarcinoma ([place]) (2011), Hyperlipidemia, Hypertension, Osteoarthritis, Reflux gastritis, Thyroid disease, tobacco abuse, and Vaginal delivery. Surgical: Ms. Haldeman  has a past surgical history that includes Thyroidectomy (2005); Cardiac catheterization (2006); Total abdominal hysterectomy; Breast surgery (Right, March 2014); and Breast cyst aspiration (Right). Family: family history includes Alcohol abuse in her father, maternal aunt, and mother; Cancer in her brother, brother, and maternal aunt; Heart disease in her brother.  Constitutional Exam  General appearance: Well nourished, well developed, and well hydrated. In no apparent acute distress Vitals:   09/21/17 0831  BP: (!) 144/97  Pulse: 86  Resp: 16  Temp: 98 F (36.7 C)  TempSrc: Oral  SpO2: 100%  Weight: 219 lb (99.3 kg)  Height: 5' 8" (1.727 m)   BMI Assessment: Estimated body mass index is 33.3 kg/m as calculated from the following:   Height as of this encounter: 5' 8" (1.727 m).   Weight as of this encounter: 219 lb (99.3 kg).  BMI interpretation table: BMI level Category Range association with higher incidence of chronic pain  <18 kg/m2 Underweight   18.5-24.9 kg/m2 Ideal body weight   25-29.9 kg/m2 Overweight Increased incidence by 20%  30-34.9 kg/m2 Obese (Class I) Increased incidence by 68%  35-39.9 kg/m2 Severe obesity (Class II) Increased incidence by 136%  >40 kg/m2 Extreme obesity (Class III) Increased incidence by 254%   Patient's current BMI Ideal Body weight  Body mass index is 33.3 kg/m. Ideal body weight: 63.9 kg (140 lb 14 oz) Adjusted ideal body weight: 78.1 kg (172 lb 2 oz)   BMI Readings from Last 4 Encounters:  09/21/17 33.30 kg/m  09/14/17 33.50 kg/m  08/29/17 32.23 kg/m  07/17/17 32.81 kg/m   Wt Readings from Last 4 Encounters:  09/21/17 219 lb (99.3 kg)  09/14/17 223 lb 9.6 oz (101.4  kg)  08/29/17 212 lb (96.2 kg)  07/17/17 219 lb (99.3 kg)  Psych/Mental status: Alert, oriented x 3 (person, place, & time)       Eyes: PERLA Respiratory: No evidence of acute respiratory distress  Cervical Spine Area Exam  Skin & Axial Inspection: No masses, redness, edema, swelling, or associated skin lesions Alignment: Symmetrical Functional ROM: Unrestricted ROM      Stability: No instability detected Muscle Tone/Strength: Functionally intact. No obvious neuro-muscular anomalies detected. Sensory (Neurological): Unimpaired Palpation: No palpable anomalies  Upper Extremity (UE) Exam    Side: Right upper extremity  Side: Left upper extremity  Skin & Extremity Inspection: Skin color, temperature, and hair growth are WNL. No peripheral edema or cyanosis. No masses, redness, swelling, asymmetry, or associated skin lesions. No contractures.  Skin & Extremity Inspection: Skin color, temperature, and hair growth are WNL. No peripheral edema or cyanosis. No masses, redness, swelling, asymmetry, or associated skin lesions. No contractures.  Functional ROM: Unrestricted ROM          Functional ROM: Unrestricted ROM          Muscle Tone/Strength: Functionally intact. No obvious neuro-muscular anomalies detected.  Muscle Tone/Strength: Functionally intact. No obvious neuro-muscular anomalies detected.  Sensory (Neurological): Unimpaired          Sensory (Neurological): Unimpaired          Palpation: No palpable anomalies              Palpation: No palpable anomalies              Provocative Test(s):  Phalen's test: deferred Tinel's test: deferred Apley's scratch test (touch opposite shoulder):  Action 1 (Across chest): deferred Action 2 (Overhead): deferred Action 3 (LB reach): deferred   Provocative Test(s):  Phalen's test: deferred Tinel's test: deferred Apley's scratch test (touch opposite shoulder):  Action 1 (Across chest): deferred Action 2 (Overhead): deferred Action 3  (LB reach): deferred    Thoracic Spine Area Exam  Skin & Axial Inspection: No masses, redness, or swelling Alignment: Symmetrical Functional ROM: Unrestricted ROM Stability: No instability detected Muscle Tone/Strength: Functionally intact. No obvious neuro-muscular anomalies detected. Sensory (Neurological): Unimpaired Muscle strength & Tone: No palpable anomalies  Lumbar Spine Area Exam  Skin & Axial Inspection: No masses, redness, or swelling Alignment: Symmetrical Functional ROM: Unrestricted ROM       Stability: No instability detected Muscle Tone/Strength: Functionally intact. No obvious neuro-muscular anomalies detected. Sensory (Neurological): Unimpaired Palpation: No palpable anomalies       Provocative Tests: Lumbar Hyperextension/rotation test: deferred today       Lumbar quadrant test (Kemp's test): deferred today       Lumbar Lateral bending test: deferred today       Patrick's Maneuver: deferred today                   FABER test: deferred today       Thigh-thrust test: deferred today       S-I compression test: deferred today       S-I distraction test: deferred today        Gait & Posture Assessment  Ambulation: Unassisted Gait: Relatively normal for age and body habitus Posture: WNL   Lower Extremity Exam    Side: Right lower extremity  Side: Left lower extremity  Stability: No instability observed          Stability: No instability observed          Skin & Extremity Inspection: Skin color, temperature, and hair growth are WNL. No peripheral edema or cyanosis. No masses, redness, swelling, asymmetry, or associated skin lesions. No contractures.  Skin & Extremity Inspection: Some redness observed  Functional ROM: Unrestricted ROM                  Functional ROM: Decreased ROM for hip and knee joints          Muscle Tone/Strength: Functionally intact. No obvious neuro-muscular anomalies detected.  Muscle Tone/Strength: Functionally intact. No obvious  neuro-muscular anomalies  detected.  Sensory (Neurological): Unimpaired  Sensory (Neurological): Arthropathic arthralgia  Palpation: No palpable anomalies  Palpation: Complains of area being tender to palpation   Assessment  Primary Diagnosis & Pertinent Problem List: The primary encounter diagnosis was Primary osteoarthritis of right ankle. Diagnoses of Plantar fascia syndrome, Class 1 obesity without serious comorbidity with body mass index (BMI) of 30.0 to 30.9 in adult, unspecified obesity type, Chronic pain syndrome, Chronic use of opiate for therapeutic purpose, Pharmacologic therapy, B12 deficiency, and Primary osteoarthritis of left knee were also pertinent to this visit.  Status Diagnosis  Controlled Controlled Controlled 1. Primary osteoarthritis of right ankle   2. Plantar fascia syndrome   3. Class 1 obesity without serious comorbidity with body mass index (BMI) of 30.0 to 30.9 in adult, unspecified obesity type   4. Chronic pain syndrome   5. Chronic use of opiate for therapeutic purpose   6. Pharmacologic therapy   7. B12 deficiency   8. Primary osteoarthritis of left knee      General Recommendations: The pain condition that the patient suffers from is best treated with a multidisciplinary approach that involves an increase in physical activity to prevent de-conditioning and worsening of the pain cycle, as well as psychological counseling (formal and/or informal) to address the co-morbid psychological affects of pain. Treatment will often involve judicious use of pain medications and interventional procedures to decrease the pain, allowing the patient to participate in the physical activity that will ultimately produce long-lasting pain reductions. The goal of the multidisciplinary approach is to return the patient to a higher level of overall function and to restore their ability to perform activities of daily living.  67 year old female with a history of hypothyroidism,  endometrial cancer, acid reflux, obesity who presents with right foot and right ankle pain along with left knee pain.  This is been going on for over 3 years.  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  She has been on tramadol for many years.  She finds this medication ineffective in managing her pain.  Patient usually takes 100 mg 3 times a day as needed.  She states that over the last couple of years she has not noticed any improvement in her pain with this medication. Patient is tried right foot and ankle steroid injections which were not effective in the past.   Patient's left knee MRI shows extensive horizontal tear of the posterior and anterior horn of the medial and lateral meniscus along with moderate osteoarthritis of the medial compartment with full-thickness cartilage loss.  She has met with an orthopedic surgeon and has been told to have left knee replacement surgery but patient wants to hold off at this time.  We discussed alternative therapies for her left knee pain including left knee genicular nerve block with possible pulse radiofrequency ablation.  Risks and benefits of this procedure were discussed.  Patient wants to hold off at this time.  We will place as needed order for a left knee genicular nerve block.  Also reviewed the patient's lab results and notified her of her slightly low B12 levels.  I encouraged her to supplement with B12 therapy that she can obtain over-the-counter.  In regards to medication management, patient did find benefit with diclofenac 75 mg twice daily that was prescribed to her at the last visit.  On patient's metabolic panel, her creatinine was elevated to 1.13.  I have instructed her to discontinue her diclofenac and her limit her  NSAID intake.  I have encouraged her to maintain hydration.  I have instructed her to discontinue tramadol.  She will sign an opioid contract with our clinic and we will initiate hydrocodone 5 mg  3 times daily as needed, quantity 90/month.  Plan: -Discontinue tramadol, diclofenac. -Sign opioid contract -Prescription for hydrocodone 5 mg 3 times daily as needed, quantity 90/month.  Prescription provided for 2 months -PRN left knee diagnostic genicular nerve block  Future considerations: Left knee genicular nerve block  Plan of Care  Pharmacotherapy (Medications Ordered): Meds ordered this encounter  Medications  . DISCONTD: HYDROcodone-acetaminophen (NORCO/VICODIN) 5-325 MG tablet    Sig: Take 1 tablet by mouth every 6 (six) hours as needed for moderate pain. For chronic pain To last for 30 days from fill date  To fill on or after: 09/21/17, 10/20/17    Dispense:  90 tablet    Refill:  0    Do not place this medication, or any other prescription from our practice, on "Automatic Refill". Patient may have prescription filled one day early if pharmacy is closed on scheduled refill date. Do not fill until:  To last until:  . HYDROcodone-acetaminophen (NORCO/VICODIN) 5-325 MG tablet    Sig: Take 1 tablet by mouth every 6 (six) hours as needed for moderate pain. For chronic pain To last for 30 days from fill date  To fill on or after: 09/21/17, 10/20/17    Dispense:  90 tablet    Refill:  0    Do not place this medication, or any other prescription from our practice, on "Automatic Refill". Patient may have prescription filled one day early if pharmacy is closed on scheduled refill date. Do not fill until:  To last until:   Lab-work, procedure(s), and/or referral(s): Orders Placed This Encounter  Procedures  . GENICULAR NERVE BLOCK    Provider-requested follow-up: Return in about 8 weeks (around 11/16/2017) for Medication Management. Time Note: Greater than 50% of the 25 minute(s) of face-to-face time spent with Ms. Konkle, was spent in counseling/coordination of care regarding: Ms. Stringfield primary cause of pain, the results of her recent test(s), the treatment plan, treatment  alternatives, the risks and possible complications of proposed treatment, medication side effects, going over the informed consent, the opioid analgesic risks and possible complications, the appropriate use of her medications, realistic expectations, the goals of pain management (increased in functionality), the medication agreement and the patient's responsibilities when it comes to controlled substances.   Future Appointments  Date Time Provider [place]  11/16/2017  8:45 AM Gillis Santa, MD [facility] None  11/21/2017  9:45 AM Ronnell Freshwater, NP [place] None    Primary Care Physician: Ronnell Freshwater, NP Location: [facility] Note by: Gillis Santa, M.D Date: 09/21/2017; Time: 9:12 AM  Patient Instructions  You were given 2 prescriptions for Hydrocodone today.  ____________________________________________________________________________________________  Genicular Nerve Block  What is a genicular nerve block? A genicular nerve block is the injection of a local anesthetic to block the nerves that transmits pain from the knee.  What is the purpose of a facet nerve block? A genicular nerve block is a diagnostic procedure to determine if the pathologic changes (i.e. arthritis, meniscal tears, etc) and inflammation within the knee joint is the source of your knee pain. It also confirms that the knee pain will respond well to the actual treatment procedure. If a genicular nerve block works, it will give you relief for several hours. After that, the pain  is expected to return to normal. This test is always performed twice (usually a week or two apart) because two successful tests are required to move onto treatment. If both diagnostic tests are positive, then we schedule a treatment called radiofrequency (RF) ablation. In this procedure, the same nerves are cauterized, which typically leads to pain relief for 4 -18 months. If this process works well for one  knee, it can be performed on the other knee if needed.  How is the procedure performed? You will be placed on the procedure table. The injection site is sterilized with either iodine or chlorhexadine. The site to be injected is numbed with a local anesthetic, and a needle is directed to the target area. X-ray guidance is used to ensure proper placement and positioning of the needle. When the needle is properly positioned near the genicular nerve, local anesthetic is injected to numb that nerve. This will be repeated at multiple sites around the knee to block all genicular nerves.  Will the procedure be painful? The injection can be painful and we therefore provide the option of receiving IV sedation. IV sedation, combined with local anesthetic, can make the injection nearly pain free. It allows you to remain very still during the procedure, which can also make the injection easier, faster, and more successful. If you decide to have IV sedation, you must have a driver to get you home safely afterwards. In addition, you cannot have anything to eat or drink within 8 hours of your appointment (clear liquids are allowed until 3 hours before the procedure). If you take medications for diabetes, these medications may need to be adjusted the morning of the procedure. Your primary care physician can help you with this adjustment.  What are the discharge instructions? If you received IV sedation do not drive or operate machinery for at least 24 hours after the procedure. You may return to work the next day following your procedure. You may resume your normal diet immediately. Do not engage in any strenuous activity for 24 hours. You should, however, engage in moderate activity that typically causes your ususal pain. If the block works, those activities should not be painful for several hours after the injection. Do not take a bath, swim, or use a hot tub for 24 hours (you may take a shower). Call the office if you  have any of the following: severe pain afterwards (different than your usual symptoms), redness/swelling/discharge at the injection site(s), fevers/chills, difficulty with bowel or bladder functions.  What are the risks and side effects? The complication rate for this procedure is very low. Whenever a needle enters the skin, bleeding or infection can occur. Some other serious but extremely rare risks include paralysis and death. You may have an allergic reaction to any of the medications used. If you have a known allergy to any medications, especially local anesthetics, notify our staff before the procedure takes place. You may experience any of the following side effects up to 4 - 6 hours after the procedure: . Leg muscle weakness or numbness may occur due to the local anesthetic affecting the nerves that control your legs (this is a temporary affect and it is not paralysis). If you have any leg weakness or numbness, walk only with assistance in order to prevent falls and injury. Your leg strength will return slowly and completely. . Dizziness may occur due to a decrease in your blood pressure. If this occurs, remain in a seated or lying position. Gradually sit up,  and then stand after at least 10 minutes of sitting. . Mild headaches may occur. Drink fluids and take pain medications if needed. If the headaches persist or become severe, call the office. . Mild discomfort at the injection site can occur. This typically lasts for a few hours but can persist for a couple days. If this occurs, take anti-inflammatories or pain medications, apply ice to the area the day of the procedure. If it persists, apply moist heat in the day(s) following.  The side effects listed above can be normal. They are not dangerous and will resolve on their own. If, however, you experience any of the following, a complication may have occurred and you should either contact your doctor. If he is not readily available, then you  should proceed to the closest urgent care center for evaluation: . Severe or progressive pain at the injection site(s) . Arm or leg weakness that progressively worsens or persists for longer than 8 hours . Severe or progressive redness, swelling, or discharge from the injections site(s) . Fevers, chills, nausea, or vomiting . Bowel or bladder dysfunction (i.e. inability to urinate or pass stool or difficulty controlling either)  How long does it take for the procedure to work? You should feel relief from your usual pain within the first hour. Again, this is only expected to last for several hours, at the most. Remember, you may be sore in the middle part of your back from the needles, and you must distinguish this from your usual pain. ____________________________________________________________________________________________

## 2017-09-21 NOTE — Progress Notes (Signed)
Safety precautions to be maintained throughout the outpatient stay will include: orient to surroundings, keep bed in low position, maintain call bell within reach at all times, provide assistance with transfer out of bed and ambulation.  

## 2017-10-17 ENCOUNTER — Ambulatory Visit: Payer: Self-pay | Admitting: Internal Medicine

## 2017-10-17 ENCOUNTER — Ambulatory Visit: Payer: Self-pay | Admitting: Nurse Practitioner

## 2017-11-16 ENCOUNTER — Encounter: Payer: Self-pay | Admitting: Student in an Organized Health Care Education/Training Program

## 2017-11-16 ENCOUNTER — Other Ambulatory Visit: Payer: Self-pay

## 2017-11-16 ENCOUNTER — Ambulatory Visit
Payer: BLUE CROSS/BLUE SHIELD | Attending: Student in an Organized Health Care Education/Training Program | Admitting: Student in an Organized Health Care Education/Training Program

## 2017-11-16 VITALS — BP 170/107 | HR 82 | Temp 98.4°F | Resp 18 | Ht 68.0 in | Wt 210.0 lb

## 2017-11-16 DIAGNOSIS — M2141 Flat foot [pes planus] (acquired), right foot: Secondary | ICD-10-CM | POA: Insufficient documentation

## 2017-11-16 DIAGNOSIS — F17210 Nicotine dependence, cigarettes, uncomplicated: Secondary | ICD-10-CM | POA: Diagnosis not present

## 2017-11-16 DIAGNOSIS — Z811 Family history of alcohol abuse and dependence: Secondary | ICD-10-CM | POA: Insufficient documentation

## 2017-11-16 DIAGNOSIS — E039 Hypothyroidism, unspecified: Secondary | ICD-10-CM | POA: Insufficient documentation

## 2017-11-16 DIAGNOSIS — Z7983 Long term (current) use of bisphosphonates: Secondary | ICD-10-CM | POA: Diagnosis not present

## 2017-11-16 DIAGNOSIS — Z79899 Other long term (current) drug therapy: Secondary | ICD-10-CM | POA: Diagnosis not present

## 2017-11-16 DIAGNOSIS — G894 Chronic pain syndrome: Secondary | ICD-10-CM | POA: Insufficient documentation

## 2017-11-16 DIAGNOSIS — K5900 Constipation, unspecified: Secondary | ICD-10-CM | POA: Insufficient documentation

## 2017-11-16 DIAGNOSIS — M19071 Primary osteoarthritis, right ankle and foot: Secondary | ICD-10-CM

## 2017-11-16 DIAGNOSIS — R1310 Dysphagia, unspecified: Secondary | ICD-10-CM | POA: Insufficient documentation

## 2017-11-16 DIAGNOSIS — M1712 Unilateral primary osteoarthritis, left knee: Secondary | ICD-10-CM | POA: Insufficient documentation

## 2017-11-16 DIAGNOSIS — Z79891 Long term (current) use of opiate analgesic: Secondary | ICD-10-CM

## 2017-11-16 DIAGNOSIS — C541 Malignant neoplasm of endometrium: Secondary | ICD-10-CM | POA: Insufficient documentation

## 2017-11-16 DIAGNOSIS — E785 Hyperlipidemia, unspecified: Secondary | ICD-10-CM | POA: Diagnosis not present

## 2017-11-16 DIAGNOSIS — Z8249 Family history of ischemic heart disease and other diseases of the circulatory system: Secondary | ICD-10-CM | POA: Insufficient documentation

## 2017-11-16 DIAGNOSIS — Z809 Family history of malignant neoplasm, unspecified: Secondary | ICD-10-CM | POA: Insufficient documentation

## 2017-11-16 DIAGNOSIS — E669 Obesity, unspecified: Secondary | ICD-10-CM | POA: Insufficient documentation

## 2017-11-16 DIAGNOSIS — M170 Bilateral primary osteoarthritis of knee: Secondary | ICD-10-CM | POA: Insufficient documentation

## 2017-11-16 DIAGNOSIS — R05 Cough: Secondary | ICD-10-CM | POA: Diagnosis not present

## 2017-11-16 DIAGNOSIS — M722 Plantar fascial fibromatosis: Secondary | ICD-10-CM | POA: Diagnosis not present

## 2017-11-16 DIAGNOSIS — Z5181 Encounter for therapeutic drug level monitoring: Secondary | ICD-10-CM | POA: Insufficient documentation

## 2017-11-16 DIAGNOSIS — I129 Hypertensive chronic kidney disease with stage 1 through stage 4 chronic kidney disease, or unspecified chronic kidney disease: Secondary | ICD-10-CM | POA: Insufficient documentation

## 2017-11-16 DIAGNOSIS — L03116 Cellulitis of left lower limb: Secondary | ICD-10-CM | POA: Insufficient documentation

## 2017-11-16 DIAGNOSIS — Z79890 Hormone replacement therapy (postmenopausal): Secondary | ICD-10-CM | POA: Diagnosis not present

## 2017-11-16 DIAGNOSIS — K219 Gastro-esophageal reflux disease without esophagitis: Secondary | ICD-10-CM | POA: Insufficient documentation

## 2017-11-16 DIAGNOSIS — N189 Chronic kidney disease, unspecified: Secondary | ICD-10-CM | POA: Insufficient documentation

## 2017-11-16 DIAGNOSIS — F419 Anxiety disorder, unspecified: Secondary | ICD-10-CM | POA: Diagnosis not present

## 2017-11-16 MED ORDER — HYDROCODONE-ACETAMINOPHEN 7.5-325 MG PO TABS: 1.0000 | ORAL_TABLET | Freq: Three times a day (TID) | ORAL | 0 refills | Status: AC | PRN

## 2017-11-16 MED ORDER — HYDROCODONE-ACETAMINOPHEN 7.5-325 MG PO TABS: 1.0000 | ORAL_TABLET | Freq: Three times a day (TID) | ORAL | 0 refills | Status: DC | PRN

## 2017-11-16 NOTE — Progress Notes (Signed)
Nursing Pain Medication Assessment:  Safety precautions to be maintained throughout the outpatient stay will include: orient to surroundings, keep bed in low position, maintain call bell within reach at all times, provide assistance with transfer out of bed and ambulation.  Medication Inspection Compliance: Pill count conducted under aseptic conditions, in front of the patient. Neither the pills nor the bottle was removed from the patient's sight at any time. Once count was completed pills were immediately returned to the patient in their original bottle.  Medication: Hydrocodone/APAP Pill/Patch Count: 6 of 90 pills remain Pill/Patch Appearance: Markings consistent with prescribed medication Bottle Appearance: Standard pharmacy container. Clearly labeled. Filled Date:07/12/ 2019 Last Medication intake:  Today

## 2017-11-16 NOTE — Progress Notes (Signed)
Patient's Name: Pamela Romero  MRN: [id number]  Referring Provider: Ronnell Freshwater, NP  DOB: May 04, 1950  PCP: Ronnell Freshwater, NP  DOS: 11/16/2017  Note by: Gillis Santa, MD  Service setting: Ambulatory outpatient  Specialty: Interventional Pain Management  Location: [facility] (AMB) Pain Management Facility    Patient type: Established   Primary Reason(s) for Visit: Encounter for prescription drug management. (Level of risk: moderate)  CC: Foot Pain (right) and Knee Pain (left)  HPI  [name] is a 67 y.o. year old, female patient, who comes today for a medication management evaluation. 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; Hyperlipidemia LDL goal <100; Transient vision disturbance, bilateral; Unspecified constipation; Cough; Dysphagia; Obesity, unspecified; Annual physical exam; S/P breast biopsy; Pes planus of both feet; Shortness of breath; Cellulitis of left lower limb; Edema, unspecified; Inflammatory polyarthropathy ([place]); Major depressive disorder, recurrent, mild ([place]); Chronic kidney disease, unspecified; Anxiety disorder, unspecified; Nicotine dependence, cigarettes, uncomplicated; Chronic pain of left knee; Plantar fascia syndrome; and Acute left-sided low back pain without sciatica on their problem list. Her primarily concern today is the Foot Pain (right) and Knee Pain (left)  Pain Assessment: Location: Right Foot Radiating:   Onset: More than a month ago Duration: Chronic pain Quality: Numbness Severity: 6 /10 (subjective, self-reported pain score)  Note: Reported level is compatible with observation.                         When using our objective Pain Scale, levels between 6 and 10/10 are said to belong in an emergency room, as it progressively worsens from a 6/10, described as severely limiting, requiring emergency care not  usually available at an outpatient pain management facility. At a 6/10 level, communication becomes difficult and requires great effort. Assistance to reach the emergency department may be required. Facial flushing and profuse sweating along with potentially dangerous increases in heart rate and blood pressure will be evident. Effect on ADL:   Timing: Constant Modifying factors: nothing BP: (!) 170/107  HR: 82  [name] was last scheduled for an appointment on 09/21/2017 for medication management. During today's appointment we reviewed [name]'s chronic pain status, as well as her outpatient medication regimen.  Patient follows up today complaining of severe bilateral knee pain, left greater than right.  She states that she is having significant difficulty at work especially bending down in regards to her knee.  She also feels that her knee has been giving out at times especially the left one.  She states that the medication that she is on is only moderately effective.  She is requesting higher dose.  She is also interested in pursuing nerve blocks were talked about at the last visit.  Worsening pain in the left knee.  The patient  reports that she does not use drugs. Her body mass index is 31.93 kg/m.  Further details on both, my assessment(s), as well as the proposed treatment plan, please see below.  Controlled Substance Pharmacotherapy Assessment REMS (Risk Evaluation and Mitigation Strategy)  Analgesic: Hydrocodone 5 mg 3 times daily as needed, quantity 90 MME/day: 15 mg/day.  Landis Martins, RN  11/16/2017  8:43 AM  Sign at close encounter Nursing Pain Medication Assessment:  Safety precautions to be maintained throughout the outpatient stay will include: orient to surroundings, keep bed in low position, maintain call bell within reach at all times, provide  assistance with transfer out of bed and ambulation.  Medication Inspection Compliance: Pill count conducted under aseptic  conditions, in front of the patient. Neither the pills nor the bottle was removed from the patient's sight at any time. Once count was completed pills were immediately returned to the patient in their original bottle.  Medication: Hydrocodone/APAP Pill/Patch Count: 6 of 90 pills remain Pill/Patch Appearance: Markings consistent with prescribed medication Bottle Appearance: Standard pharmacy container. Clearly labeled. Filled Date:07/12/ 2019 Last Medication intake:  Today   Pharmacokinetics: Liberation and absorption (onset of action): WNL Distribution (time to peak effect): WNL Metabolism and excretion (duration of action): WNL         Pharmacodynamics: Desired effects: Analgesia: [name] reports <50% benefit. Functional ability: Patient reports that medication does help, but not nearly as much as she would like Clinically meaningful improvement in function (CMIF): Sustained CMIF goals met Perceived effectiveness: Described as relatively effective but with some room for improvement Undesirable effects: Side-effects or Adverse reactions: None reported Monitoring: Industry PMP: Online review of the past 49-monthperiod conducted. Compliant with practice rules and regulations Last UDS on record: Summary  Date Value Ref Range Status  08/30/2017 FINAL  Final    Comment:    ==================================================================== TOXAS[facility]RE COMP DRUG ANALYSIS,UR ==================================================================== Test                             Result       Flag       Units Drug Present and Declared for Prescription Verification   Tramadol                       >1479        EXPECTED   ng/mg creat   O-Desmethyltramadol            >1479        EXPECTED   ng/mg creat   N-Desmethyltramadol            >1479        EXPECTED   ng/mg creat    Source of tramadol is a prescription medication.    O-desmethyltramadol and N-desmethyltramadol are expected    metabolites  of tramadol.   Diclofenac                     PRESENT      EXPECTED Drug Present not Declared for Prescription Verification   Acetaminophen                  PRESENT      UNEXPECTED Drug Absent but Declared for Prescription Verification   Duloxetine                     Not Detected UNEXPECTED ==================================================================== Test                      Result    Flag   Units      Ref Range   Creatinine              338              mg/dL      >=20 ==================================================================== Declared Medications:  The flagging and interpretation on this report are based on the  following declared medications.  Unexpected results may arise from  inaccuracies in the declared medications.  **Note: The testing scope of this panel includes these  medications:  Duloxetine  Tramadol  **Note: The testing scope of this panel does not include small to  moderate amounts of these reported medications:  Diclofenac  **Note: The testing scope of this panel does not include following  reported medications:  Docusate (Colace)  Hydrochlorothiazide (Zestoretic)  Levothyroxine (Synthroid)  Lisinopril (Zestoretic)  Omeprazole (Nexium)  Ranitidine  Rosuvastatin ==================================================================== For clinical consultation, please call [phone]. ====================================================================    UDS interpretation: Compliant          Medication Assessment Form: Reviewed. Patient indicates being compliant with therapy Treatment compliance: Compliant Risk Assessment Profile: Aberrant behavior: See prior evaluations. None observed or detected today Comorbid factors increasing risk of overdose: See prior notes. No additional risks detected today Risk of substance use disorder (SUD): Low  ORT Scoring interpretation table:  Score <3 = Low Risk for SUD  Score between 4-7 = Moderate Risk  for SUD  Score >8 = High Risk for Opioid Abuse   Risk Mitigation Strategies:  Patient Counseling: Covered Patient-Prescriber Agreement (PPA): Present and active  Notification to other healthcare providers: Done  Pharmacologic Plan: Therapy adjustment: Will temporarily increase dose to 7.5 mg 3 times daily as needed same quantity of 90 until we can do genicular nerve blocks and genicular nerve radio frequency ablation.  Laboratory Chemistry  Inflammation Markers (CRP: Acute Phase) (ESR: Chronic Phase) Lab Results  Component Value Date   ESRSEDRATE 19 03/30/2012                         Rheumatology Markers No results found for: RF, ANA, LABURIC, URICUR, LYMEIGGIGMAB, LYMEABIGMQN, HLAB27                      Renal Function Markers Lab Results  Component Value Date   BUN 19 08/29/2017   CREATININE 1.13 (H) 08/29/2017   BCR 17 08/29/2017   GFRAA 59 (L) 08/29/2017   GFRNONAA 51 (L) 08/29/2017                             Hepatic Function Markers Lab Results  Component Value Date   AST 15 08/29/2017   ALT 18 08/29/2017   ALBUMIN 4.4 08/29/2017   ALKPHOS 126 (H) 08/29/2017   HCVAB NEGATIVE 01/21/2015                        Electrolytes Lab Results  Component Value Date   NA 141 08/29/2017   K 3.8 08/29/2017   CL 102 08/29/2017   CALCIUM 9.3 08/29/2017   MG 1.7 08/29/2017                        Neuropathy Markers Lab Results  Component Value Date   VITAMINB12 224 (L) 08/29/2017   HGBA1C 4.8 01/21/2015   HIV NONREACTIVE 01/21/2015                        Bone Pathology Markers Lab Results  Component Value Date   VD25OH 26.66 (L) 05/14/2014                         Coagulation Parameters Lab Results  Component Value Date   PLT 127.0 (L) 09/16/2013  Cardiovascular Markers Lab Results  Component Value Date   HGB 12.1 09/16/2013   HCT 36.3 09/16/2013                         CA Markers No results found for: CEA, CA125, LABCA2                       Note: Lab results reviewed.  Recent Diagnostic Imaging Results  MR KNEE LEFT WO CONTRAST CLINICAL DATA:  Persistent anterior knee pain for 1.5 months.  EXAM: MRI OF THE LEFT KNEE WITHOUT CONTRAST  TECHNIQUE: Multiplanar, multisequence MR imaging of the knee was performed. No intravenous contrast was administered.  COMPARISON:  None.  FINDINGS: MENISCI  Medial meniscus: There is an extensive horizontal tear posterior horn extending from the superior surface to the periphery. The midbody is degenerated and peripherally subluxed.  Lateral meniscus: Extensive horizontal tear involving the anterior horn and midbody with small parameniscal cysts at the level of the midbody best seen on image 34 of series 7 and series 31 of series 5.  LIGAMENTS  Cruciates:  Intact.  Collaterals:  Intact.  CARTILAGE  Patellofemoral: Partial-thickness cartilage loss of the superior aspect of the medial facet of the patella.  Medial: Denuding of the articular cartilage of the medial tibial plateau with subcortical edema and subcortical cyst formation in the periphery of the tibial plateau. Thinning of the articular cartilage of the femoral condyle.  Lateral: Focal full-thickness cartilage loss in the posterior aspect of the lateral tibial plateau.  Joint: Trace joint effusion. Normal Hoffa's fat pad. No plical thickening.  Popliteal Fossa:  Small Baker's cyst.  Intact popliteus tendon.  Extensor Mechanism:  Normal.  Bones: Moderate marginal osteophytes in the medial compartment. Tiny marginal osteophytes in the lateral compartment.  Other: None  IMPRESSION: 1. Extensive horizontal tear of the posterior horn of the medial meniscus. 2. Extensive horizontal tear of the anterior horn and midbody of the lateral meniscus. 3. Moderate osteoarthritis of the medial compartment with full-thickness cartilage loss and subcortical edema and subcortical cyst formation in the  medial tibial plateau.  Electronically Signed   By: Lorriane Shire M.D.   On: 09/01/2017 10:59  Complexity Note: Imaging results reviewed. Results shared with Ms. Colasurdo, using Layman's terms.                         Meds   Current Outpatient Medications:  .  docusate sodium (COLACE) 100 MG capsule, Take 100 mg by mouth 2 (two) times daily as needed., Disp: , Rfl:  .  DULoxetine (CYMBALTA) 20 MG capsule, Take 20 mg by mouth daily., Disp: , Rfl:  .  esomeprazole (NEXIUM) 40 MG capsule, Take 1 capsule (40 mg total) by mouth daily., Disp: 30 capsule, Rfl: 3 .  levothyroxine (SYNTHROID, LEVOTHROID) 150 MCG tablet, Take 2 tablets (300 mcg total) by mouth daily., Disp: 60 tablet, Rfl: 3 .  lisinopril (PRINIVIL,ZESTRIL) 10 MG tablet, Take 1 tablet (10 mg total) by mouth daily., Disp: 30 tablet, Rfl: 3 .  rosuvastatin (CRESTOR) 5 MG tablet, Take 5 mg by mouth at bedtime., Disp: , Rfl:  .  [START ON 12/18/2017] HYDROcodone-acetaminophen (NORCO) 7.5-325 MG tablet, Take 1 tablet by mouth 3 (three) times daily as needed for moderate pain., Disp: 90 tablet, Rfl: 0 .  [START ON 11/18/2017] HYDROcodone-acetaminophen (NORCO) 7.5-325 MG tablet, Take 1 tablet by mouth 3 (three) times daily as  needed for moderate pain., Disp: 90 tablet, Rfl: 0 .  ranitidine (ZANTAC) 150 MG capsule, Take by mouth., Disp: , Rfl:  .  traMADol (ULTRAM) 50 MG tablet, Take 2 tablets (100 mg total) by mouth every 6 (six) hours as needed. (Patient not taking: Reported on 11/16/2017), Disp: 120 tablet, Rfl: 1  ROS  Constitutional: Denies any fever or chills Gastrointestinal: No reported hemesis, hematochezia, vomiting, or acute GI distress Musculoskeletal: Denies any acute onset joint swelling, redness, loss of ROM, or weakness Neurological: No reported episodes of acute onset apraxia, aphasia, dysarthria, agnosia, amnesia, paralysis, loss of coordination, or loss of consciousness  Allergies  Ms. Soberano has No Known  Allergies.  Stella  Drug: Ms. Nudo  reports that she does not use drugs. Alcohol:  reports that she does not drink alcohol. Tobacco:  reports that she has been smoking. She has never used smokeless tobacco. Medical:  has a past medical history of Abnormal EKG, CAD (coronary artery disease), Cancer ([place]), Endometrial adenocarcinoma ([place]) (2011), Hyperlipidemia, Hypertension, Osteoarthritis, Reflux gastritis, Thyroid disease, tobacco abuse, and Vaginal delivery. Surgical: Ms. Gardiner  has a past surgical history that includes Thyroidectomy (2005); Cardiac catheterization (2006); Total abdominal hysterectomy; Breast surgery (Right, March 2014); and Breast cyst aspiration (Right). Family: family history includes Alcohol abuse in her father, maternal aunt, and mother; Cancer in her brother, brother, and maternal aunt; Heart disease in her brother.  Constitutional Exam  General appearance: Well nourished, well developed, and well hydrated. In no apparent acute distress Vitals:   11/16/17 0839  BP: (!) 170/107  Pulse: 82  Resp: 18  Temp: 98.4 F (36.9 C)  TempSrc: Oral  SpO2: 100%  Weight: 210 lb (95.3 kg)  Height: _0  (1.727 m)   BMI Assessment: Estimated body mass index is 31.93 kg/m as calculated from the following:   Height as of this encounter: _1  (1.727 m).   Weight as of this encounter: 210 lb (95.3 kg).  BMI interpretation table: BMI level Category Range association with higher incidence of chronic pain  <18 kg/m2 Underweight   18.5-24.9 kg/m2 Ideal body weight   25-29.9 kg/m2 Overweight Increased incidence by 20%  30-34.9 kg/m2 Obese (Class I) Increased incidence by 68%  35-39.9 kg/m2 Severe obesity (Class II) Increased incidence by 136%  >40 kg/m2 Extreme obesity (Class III) Increased incidence by 254%   Patient's current BMI Ideal Body weight  Body mass index is 31.93 kg/m. Ideal body weight: 63.9 kg (140 lb 14 oz) Adjusted ideal body weight: 76.4 kg (168 lb 8.4  oz)   BMI Readings from Last 4 Encounters:  11/16/17 31.93 kg/m  09/21/17 33.30 kg/m  09/14/17 33.50 kg/m  08/29/17 32.23 kg/m   Wt Readings from Last 4 Encounters:  11/16/17 210 lb (95.3 kg)  09/21/17 219 lb (99.3 kg)  09/14/17 223 lb 9.6 oz (101.4 kg)  08/29/17 212 lb (96.2 kg)  Psych/Mental status: Alert, oriented x 3 (person, place, & time)       Eyes: PERLA Respiratory: No evidence of acute respiratory distress  Cervical Spine Area Exam  Skin & Axial Inspection: No masses, redness, edema, swelling, or associated skin lesions Alignment: Symmetrical Functional ROM: Unrestricted ROM      Stability: No instability detected Muscle Tone/Strength: Functionally intact. No obvious neuro-muscular anomalies detected. Sensory (Neurological): Unimpaired Palpation: No palpable anomalies              Upper Extremity (UE) Exam    Side: Right upper extremity  Side: Left upper  extremity  Skin & Extremity Inspection: Skin color, temperature, and hair growth are WNL. No peripheral edema or cyanosis. No masses, redness, swelling, asymmetry, or associated skin lesions. No contractures.  Skin & Extremity Inspection: Skin color, temperature, and hair growth are WNL. No peripheral edema or cyanosis. No masses, redness, swelling, asymmetry, or associated skin lesions. No contractures.  Functional ROM: Unrestricted ROM          Functional ROM: Unrestricted ROM          Muscle Tone/Strength: Functionally intact. No obvious neuro-muscular anomalies detected.  Muscle Tone/Strength: Functionally intact. No obvious neuro-muscular anomalies detected.  Sensory (Neurological): Unimpaired          Sensory (Neurological): Unimpaired          Palpation: No palpable anomalies              Palpation: No palpable anomalies              Provocative Test(s):  Phalen's test: deferred Tinel's test: deferred Apley's scratch test (touch opposite shoulder):  Action 1 (Across chest): deferred Action 2 (Overhead):  deferred Action 3 (LB reach): deferred   Provocative Test(s):  Phalen's test: deferred Tinel's test: deferred Apley's scratch test (touch opposite shoulder):  Action 1 (Across chest): deferred Action 2 (Overhead): deferred Action 3 (LB reach): deferred    Thoracic Spine Area Exam  Skin & Axial Inspection: No masses, redness, or swelling Alignment: Symmetrical Functional ROM: Unrestricted ROM Stability: No instability detected Muscle Tone/Strength: Functionally intact. No obvious neuro-muscular anomalies detected. Sensory (Neurological): Unimpaired Muscle strength & Tone: No palpable anomalies  Lumbar Spine Area Exam  Skin & Axial Inspection: No masses, redness, or swelling Alignment: Symmetrical Functional ROM: Unrestricted ROM       Stability: No instability detected Muscle Tone/Strength: Functionally intact. No obvious neuro-muscular anomalies detected. Sensory (Neurological): Unimpaired Palpation: No palpable anomalies       Provocative Tests: Hyperextension/rotation test: deferred today       Lumbar quadrant test (Kemp's test): deferred today       Lateral bending test: deferred today       Patrick's Maneuver: deferred today                   FABER test: deferred today                   S-I anterior distraction/compression test: deferred today         S-I lateral compression test: deferred today         S-I Thigh-thrust test: deferred today         S-I Gaenslen's test: deferred today           Gait & Posture Assessment  Ambulation: Unassisted Gait: Relatively normal for age and body habitus Posture: WNL   Lower Extremity Exam    Side: Right lower extremity  Side: Left lower extremity  Stability: No instability observed          Stability: No instability observed          Skin & Extremity Inspection: Skin color, temperature, and hair growth are WNL. No peripheral edema or cyanosis. No masses, redness, swelling, asymmetry, or associated skin lesions. No contractures.   Skin & Extremity Inspection: Skin color, temperature, and hair growth are WNL. No peripheral edema or cyanosis. No masses, redness, swelling, asymmetry, or associated skin lesions. No contractures.  Functional ROM: Decreased ROM for knee joint          Functional ROM:  Decreased ROM for knee joint          Muscle Tone/Strength: Functionally intact. No obvious neuro-muscular anomalies detected.  Muscle Tone/Strength: Functionally intact. No obvious neuro-muscular anomalies detected.  Sensory (Neurological): Arthropathic arthralgia  Sensory (Neurological): Arthropathic arthralgia  Palpation: Complains of area being tender to palpation  Palpation: Complains of area being tender to palpation   Assessment  Primary Diagnosis & Pertinent Problem List: The primary encounter diagnosis was Bilateral primary osteoarthritis of knee. Diagnoses of Primary osteoarthritis of left knee, Primary osteoarthritis of right ankle, Pharmacologic therapy, Chronic use of opiate for therapeutic purpose, and Chronic pain syndrome were also pertinent to this visit.  Status Diagnosis  Worsening Worsening Worsening 1. Bilateral primary osteoarthritis of knee   2. Primary osteoarthritis of left knee   3. Primary osteoarthritis of right ankle   4. Pharmacologic therapy   5. Chronic use of opiate for therapeutic purpose   6. Chronic pain syndrome      General Recommendations: The pain condition that the patient suffers from is best treated with a multidisciplinary approach that involves an increase in physical activity to prevent de-conditioning and worsening of the pain cycle, as well as psychological counseling (formal and/or informal) to address the co-morbid psychological affects of pain. Treatment will often involve judicious use of pain medications and interventional procedures to decrease the pain, allowing the patient to participate in the physical activity that will ultimately produce long-lasting pain reductions. The  goal of the multidisciplinary approach is to return the patient to a higher level of overall function and to restore their ability to perform activities of daily living.  67 year old female with a history of hypothyroidism, endometrial cancer, acid reflux, obesity who presents with right foot and right ankle pain along with left knee pain. This is been going on for over 3 years. 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 She had been tramadol for many years. She finds medication ineffective in managing her pain. Patient usually took 100 mg 3 times a day as needed. She states that over the last couple of years she has not noticed any improvement in her pain with this medication. Patient is tried right foot and ankle steroid injections which were not effective in the past. Patient's left knee MRI shows extensive horizontal tear of the posterior and anterior horn of the medial and lateral meniscus along with moderate osteoarthritis of the medial compartment with full-thickness cartilage loss.  She has met with an orthopedic surgeon and has been told to have left knee replacement surgery but patient wants to hold off at this time.    Patient follows up today complaining of severe bilateral knee pain, left greater than right.  She states that she is having significant difficulty at work especially bending down in regards to her knee.  She also feels that her knee has been giving out at times especially the left one.  She states that the medication that she is on is only moderately effective.  She is requesting higher dose.  She is also interested in pursuing nerve blocks were talked about at the last visit.  We discussed alternative therapies for her left knee pain including left knee genicular nerve block with possible pulse radiofrequency ablation.  Risks and benefits of this procedure were discussed.   We will schedule the patient for bilateral genicular nerve  block #1.  I will also increase her hydrocodone from 5 mg to 7.5 mg 3 times daily as needed  for a short period of time until we can get her knee pain better from the genicular nerve blocks and possible radio frequency ablation.  Plan: -Bilateral genicular nerve block #1 with sedation for bilateral knee osteoarthritis -Increase hydrocodone as below to 7.5 mg 3 times daily as needed -Patient instructed to continue Cymbalta   Plan of Care  Pharmacotherapy (Medications Ordered): Meds ordered this encounter  Medications  . HYDROcodone-acetaminophen (NORCO) 7.5-325 MG tablet    Sig: Take 1 tablet by mouth 3 (three) times daily as needed for moderate pain.    Dispense:  90 tablet    Refill:  0    Do not place this medication, or any other prescription from our practice, on "Automatic Refill". Patient may have prescription filled one day early if pharmacy is closed on scheduled refill date. :  . HYDROcodone-acetaminophen ([place]) 7.5-325 MG tablet    Sig: Take 1 tablet by mouth 3 (three) times daily as needed for moderate pain.    Dispense:  90 tablet    Refill:  0    Do not place this medication, or any other prescription from our practice, on "Automatic Refill". Patient may have prescription filled one day early if pharmacy is closed on scheduled refill date.   Lab-work, procedure(s), and/or referral(s): Orders Placed This Encounter  Procedures  . GENICULAR NERVE BLOCK    Provider-requested follow-up: Return in about 2 weeks (around 11/30/2017) for Procedure.  Time Note: Greater than 50% of the 25 minute(s) of face-to-face time spent with Ms. Elenbaas, was spent in counseling/coordination of care regarding: Ms. Hoot primary cause of pain, the treatment plan, treatment alternatives, the risks and possible complications of proposed treatment, medication side effects, going over the informed consent, the opioid analgesic risks and possible complications, the results, interpretation and  significance of  her recent diagnostic interventional treatment(s), the appropriate use of her medications, realistic expectations, the goals of pain management (increased in functionality) and the medication agreement.  Future Appointments  Date Time Provider [place]  11/21/2017 10:30 AM Kendell Bane, NP [facility] None  11/29/2017  8:45 AM Gillis Santa, MD [facility] None  01/16/2018  9:30 AM Gillis Santa, MD [facility] None    Primary Care Physician: Ronnell Freshwater, NP Location: [facility] Note by: Gillis Santa, M.D Date: 11/16/2017; Time: 9:27 AM  Patient Instructions  ____________________________________________________________________________________________  Preparing for Procedure with Sedation  Instructions: . Oral Intake: Do not eat or drink anything for at least 8 hours prior to your procedure. . Transportation: Public transportation is not allowed. Bring an adult driver. The driver must be physically present in our waiting room before any procedure can be started. [name] Physical Assistance: Bring an adult physically capable of assisting you, in the event you need help. This adult should keep you company at home for at least 6 hours after the procedure. . Blood Pressure Medicine: Take your blood pressure medicine with a sip of water the morning of the procedure. . Blood thinners: Notify our staff if you are taking any blood thinners. Depending on which one you take, there will be specific instructions on how and when to stop it. . Diabetics on insulin: Notify the staff so that you can be scheduled 1st case in the morning. If your diabetes requires high dose insulin, take only  of your normal insulin dose the morning of the procedure and notify the staff that you have done so. . Preventing infections: Shower with an antibacterial soap the morning of  your procedure. . Build-up your immune system: Take 1000 mg of Vitamin C with every meal (3  times a day) the day prior to your procedure. [name] Antibiotics: Inform the staff if you have a condition or reason that requires you to take antibiotics before dental procedures. . Pregnancy: If you are pregnant, call and cancel the procedure. . Sickness: If you have a cold, fever, or any active infections, call and cancel the procedure. . Arrival: You must be in the facility at least 30 minutes prior to your scheduled procedure. . Children: Do not bring children with you. . Dress appropriately: Bring dark clothing that you would not mind if they get stained. . Valuables: Do not bring any jewelry or valuables.  Procedure appointments are reserved for interventional treatments only. [name] No Prescription Refills. . No medication changes will be discussed during procedure appointments. . No disability issues will be discussed.  Reasons to call and reschedule or cancel your procedure: (Following these recommendations will minimize the risk of a serious complication.) . Surgeries: Avoid having procedures within 2 weeks of any surgery. (Avoid for 2 weeks before or after any surgery). . Flu Shots: Avoid having procedures within 2 weeks of a flu shots or . (Avoid for 2 weeks before or after immunizations). . Barium: Avoid having a procedure within 7-10 days after having had a radiological study involving the use of radiological contrast. (Myelograms, Barium swallow or enema study). . Heart attacks: Avoid any elective procedures or surgeries for the initial 6 months after a "Myocardial Infarction" (Heart Attack). . Blood thinners: It is imperative that you stop these medications before procedures. Let us know if you if you take any blood thinner.  . Infection: Avoid procedures during or within two weeks of an infection (including chest colds or gastrointestinal problems). Symptoms associated with infections include: Localized redness, fever, chills, night sweats or profuse sweating, burning sensation when  voiding, cough, congestion, stuffiness, runny nose, sore throat, diarrhea, nausea, vomiting, cold or Flu symptoms, recent or current infections. It is specially important if the infection is over the area that we intend to treat. [name] Heart and lung problems: Symptoms that may suggest an active cardiopulmonary problem include: cough, chest pain, breathing difficulties or shortness of breath, dizziness, ankle swelling, uncontrolled high or unusually low blood pressure, and/or palpitations. If you are experiencing any of these symptoms, cancel your procedure and contact your primary care physician for an evaluation.  Remember:  Regular Business hours are:  Monday to Thursday 8:00 AM to 4:00 PM  Provider's Schedule: Milinda Pointer, MD:  Procedure days: Tuesday and Thursday 7:30 AM to 4:00 PM  Gillis Santa, MD:  Procedure days: Monday and Wednesday 7:30 AM to 4:00 PM ____________________________________________________________________________________________  ____________________________________________________________________________________________  Genicular Nerve Block  What is a genicular nerve block? A genicular nerve block is the injection of a local anesthetic to block the nerves that transmits pain from the knee.  What is the purpose of a facet nerve block? A genicular nerve block is a diagnostic procedure to determine if the pathologic changes (i.e. arthritis, meniscal tears, etc) and inflammation within the knee joint is the source of your knee pain. It also confirms that the knee pain will respond well to the actual treatment procedure. If a genicular nerve block works, it will give you relief for several hours. After that, the pain is expected to return to normal. This test is always performed twice (usually a week or two apart) because two successful tests are required to  move onto treatment. If both diagnostic tests are positive, then we schedule a treatment called radiofrequency  (RF) ablation. In this procedure, the same nerves are cauterized, which typically leads to pain relief for 4 -18 months. If this process works well for one knee, it can be performed on the other knee if needed.  How is the procedure performed? You will be placed on the procedure table. The injection site is sterilized with either iodine or chlorhexadine. The site to be injected is numbed with a local anesthetic, and a needle is directed to the target area. X-ray guidance is used to ensure proper placement and positioning of the needle. When the needle is properly positioned near the genicular nerve, local anesthetic is injected to numb that nerve. This will be repeated at multiple sites around the knee to block all genicular nerves.  Will the procedure be painful? The injection can be painful and we therefore provide the option of receiving IV sedation. IV sedation, combined with local anesthetic, can make the injection nearly pain free. It allows you to remain very still during the procedure, which can also make the injection easier, faster, and more successful. If you decide to have IV sedation, you must have a driver to get you home safely afterwards. In addition, you cannot have anything to eat or drink within 8 hours of your appointment (clear liquids are allowed until 3 hours before the procedure). If you take medications for diabetes, these medications may need to be adjusted the morning of the procedure. Your primary care physician can help you with this adjustment.  What are the discharge instructions? If you received IV sedation do not drive or operate machinery for at least 24 hours after the procedure. You may return to work the next day following your procedure. You may resume your normal diet immediately. Do not engage in any strenuous activity for 24 hours. You should, however, engage in moderate activity that typically causes your ususal pain. If the block works, those activities should not  be painful for several hours after the injection. Do not take a bath, swim, or use a hot tub for 24 hours (you may take a shower). Call the office if you have any of the following: severe pain afterwards (different than your usual symptoms), redness/swelling/discharge at the injection site(s), fevers/chills, difficulty with bowel or bladder functions.  What are the risks and side effects? The complication rate for this procedure is very low. Whenever a needle enters the skin, bleeding or infection can occur. Some other serious but extremely rare risks include paralysis and death. You may have an allergic reaction to any of the medications used. If you have a known allergy to any medications, especially local anesthetics, notify our staff before the procedure takes place. You may experience any of the following side effects up to 4 - 6 hours after the procedure: . Leg muscle weakness or numbness may occur due to the local anesthetic affecting the nerves that control your legs (this is a temporary affect and it is not paralysis). If you have any leg weakness or numbness, walk only with assistance in order to prevent falls and injury. Your leg strength will return slowly and completely. . Dizziness may occur due to a decrease in your blood pressure. If this occurs, remain in a seated or lying position. Gradually sit up, and then stand after at least 10 minutes of sitting. . Mild headaches may occur. Drink fluids and take pain medications if needed. If the  headaches persist or become severe, call the office. . Mild discomfort at the injection site can occur. This typically lasts for a few hours but can persist for a couple days. If this occurs, take anti-inflammatories or pain medications, apply ice to the area the day of the procedure. If it persists, apply moist heat in the day(s) following.  The side effects listed above can be normal. They are not dangerous and will resolve on their own. If, however, you  experience any of the following, a complication may have occurred and you should either contact your doctor. If he is not readily available, then you should proceed to the closest urgent care center for evaluation: . Severe or progressive pain at the injection site(s) . Arm or leg weakness that progressively worsens or persists for longer than 8 hours . Severe or progressive redness, swelling, or discharge from the injections site(s) . Fevers, chills, nausea, or vomiting . Bowel or bladder dysfunction (i.e. inability to urinate or pass stool or difficulty controlling either)  How long does it take for the procedure to work? You should feel relief from your usual pain within the first hour. Again, this is only expected to last for several hours, at the most. Remember, you may be sore in the middle part of your back from the needles, and you must distinguish this from your usual pain. ____________________________________________________________________________________________

## 2017-11-16 NOTE — Patient Instructions (Signed)
____________________________________________________________________________________________  Preparing for Procedure with Sedation  Instructions: . Oral Intake: Do not eat or drink anything for at least 8 hours prior to your procedure. . Transportation: Public transportation is not allowed. Bring an adult driver. The driver must be physically present in our waiting room before any procedure can be started. . Physical Assistance: Bring an adult physically capable of assisting you, in the event you need help. This adult should keep you company at home for at least 6 hours after the procedure. . Blood Pressure Medicine: Take your blood pressure medicine with a sip of water the morning of the procedure. . Blood thinners: Notify our staff if you are taking any blood thinners. Depending on which one you take, there will be specific instructions on how and when to stop it. . Diabetics on insulin: Notify the staff so that you can be scheduled 1st case in the morning. If your diabetes requires high dose insulin, take only  of your normal insulin dose the morning of the procedure and notify the staff that you have done so. . Preventing infections: Shower with an antibacterial soap the morning of your procedure. . Build-up your immune system: Take 1000 mg of Vitamin C with every meal (3 times a day) the day prior to your procedure. . Antibiotics: Inform the staff if you have a condition or reason that requires you to take antibiotics before dental procedures. . Pregnancy: If you are pregnant, call and cancel the procedure. . Sickness: If you have a cold, fever, or any active infections, call and cancel the procedure. . Arrival: You must be in the facility at least 30 minutes prior to your scheduled procedure. . Children: Do not bring children with you. . Dress appropriately: Bring dark clothing that you would not mind if they get stained. . Valuables: Do not bring any jewelry or valuables.  Procedure  appointments are reserved for interventional treatments only. . No Prescription Refills. . No medication changes will be discussed during procedure appointments. . No disability issues will be discussed.  Reasons to call and reschedule or cancel your procedure: (Following these recommendations will minimize the risk of a serious complication.) . Surgeries: Avoid having procedures within 2 weeks of any surgery. (Avoid for 2 weeks before or after any surgery). . Flu Shots: Avoid having procedures within 2 weeks of a flu shots or . (Avoid for 2 weeks before or after immunizations). . Barium: Avoid having a procedure within 7-10 days after having had a radiological study involving the use of radiological contrast. (Myelograms, Barium swallow or enema study). . Heart attacks: Avoid any elective procedures or surgeries for the initial 6 months after a "Myocardial Infarction" (Heart Attack). . Blood thinners: It is imperative that you stop these medications before procedures. Let us know if you if you take any blood thinner.  . Infection: Avoid procedures during or within two weeks of an infection (including chest colds or gastrointestinal problems). Symptoms associated with infections include: Localized redness, fever, chills, night sweats or profuse sweating, burning sensation when voiding, cough, congestion, stuffiness, runny nose, sore throat, diarrhea, nausea, vomiting, cold or Flu symptoms, recent or current infections. It is specially important if the infection is over the area that we intend to treat. . Heart and lung problems: Symptoms that may suggest an active cardiopulmonary problem include: cough, chest pain, breathing difficulties or shortness of breath, dizziness, ankle swelling, uncontrolled high or unusually low blood pressure, and/or palpitations. If you are experiencing any of these symptoms, cancel   your procedure and contact your primary care physician for an evaluation.  Remember:   Regular Business hours are:  Monday to Thursday 8:00 AM to 4:00 PM  Provider's Schedule: Milinda Pointer, MD:  Procedure days: Tuesday and Thursday 7:30 AM to 4:00 PM  Gillis Santa, MD:  Procedure days: Monday and Wednesday 7:30 AM to 4:00 PM ____________________________________________________________________________________________  ____________________________________________________________________________________________  Genicular Nerve Block  What is a genicular nerve block? A genicular nerve block is the injection of a local anesthetic to block the nerves that transmits pain from the knee.  What is the purpose of a facet nerve block? A genicular nerve block is a diagnostic procedure to determine if the pathologic changes (i.e. arthritis, meniscal tears, etc) and inflammation within the knee joint is the source of your knee pain. It also confirms that the knee pain will respond well to the actual treatment procedure. If a genicular nerve block works, it will give you relief for several hours. After that, the pain is expected to return to normal. This test is always performed twice (usually a week or two apart) because two successful tests are required to move onto treatment. If both diagnostic tests are positive, then we schedule a treatment called radiofrequency (RF) ablation. In this procedure, the same nerves are cauterized, which typically leads to pain relief for 4 -18 months. If this process works well for one knee, it can be performed on the other knee if needed.  How is the procedure performed? You will be placed on the procedure table. The injection site is sterilized with either iodine or chlorhexadine. The site to be injected is numbed with a local anesthetic, and a needle is directed to the target area. X-ray guidance is used to ensure proper placement and positioning of the needle. When the needle is properly positioned near the genicular nerve, local anesthetic is  injected to numb that nerve. This will be repeated at multiple sites around the knee to block all genicular nerves.  Will the procedure be painful? The injection can be painful and we therefore provide the option of receiving IV sedation. IV sedation, combined with local anesthetic, can make the injection nearly pain free. It allows you to remain very still during the procedure, which can also make the injection easier, faster, and more successful. If you decide to have IV sedation, you must have a driver to get you home safely afterwards. In addition, you cannot have anything to eat or drink within 8 hours of your appointment (clear liquids are allowed until 3 hours before the procedure). If you take medications for diabetes, these medications may need to be adjusted the morning of the procedure. Your primary care physician can help you with this adjustment.  What are the discharge instructions? If you received IV sedation do not drive or operate machinery for at least 24 hours after the procedure. You may return to work the next day following your procedure. You may resume your normal diet immediately. Do not engage in any strenuous activity for 24 hours. You should, however, engage in moderate activity that typically causes your ususal pain. If the block works, those activities should not be painful for several hours after the injection. Do not take a bath, swim, or use a hot tub for 24 hours (you may take a shower). Call the office if you have any of the following: severe pain afterwards (different than your usual symptoms), redness/swelling/discharge at the injection site(s), fevers/chills, difficulty with bowel or bladder functions.  What  are the risks and side effects? The complication rate for this procedure is very low. Whenever a needle enters the skin, bleeding or infection can occur. Some other serious but extremely rare risks include paralysis and death. You may have an allergic reaction to  any of the medications used. If you have a known allergy to any medications, especially local anesthetics, notify our staff before the procedure takes place. You may experience any of the following side effects up to 4 - 6 hours after the procedure: . Leg muscle weakness or numbness may occur due to the local anesthetic affecting the nerves that control your legs (this is a temporary affect and it is not paralysis). If you have any leg weakness or numbness, walk only with assistance in order to prevent falls and injury. Your leg strength will return slowly and completely. . Dizziness may occur due to a decrease in your blood pressure. If this occurs, remain in a seated or lying position. Gradually sit up, and then stand after at least 10 minutes of sitting. . Mild headaches may occur. Drink fluids and take pain medications if needed. If the headaches persist or become severe, call the office. . Mild discomfort at the injection site can occur. This typically lasts for a few hours but can persist for a couple days. If this occurs, take anti-inflammatories or pain medications, apply ice to the area the day of the procedure. If it persists, apply moist heat in the day(s) following.  The side effects listed above can be normal. They are not dangerous and will resolve on their own. If, however, you experience any of the following, a complication may have occurred and you should either contact your doctor. If he is not readily available, then you should proceed to the closest urgent care center for evaluation: . Severe or progressive pain at the injection site(s) . Arm or leg weakness that progressively worsens or persists for longer than 8 hours . Severe or progressive redness, swelling, or discharge from the injections site(s) . Fevers, chills, nausea, or vomiting . Bowel or bladder dysfunction (i.e. inability to urinate or pass stool or difficulty controlling either)  How long does it take for the  procedure to work? You should feel relief from your usual pain within the first hour. Again, this is only expected to last for several hours, at the most. Remember, you may be sore in the middle part of your back from the needles, and you must distinguish this from your usual pain. ____________________________________________________________________________________________

## 2017-11-21 ENCOUNTER — Ambulatory Visit: Payer: Self-pay | Admitting: Adult Health

## 2017-11-29 ENCOUNTER — Ambulatory Visit
Admission: RE | Admit: 2017-11-29 | Discharge: 2017-11-29 | Disposition: A | Discharge status: Home / Self Care | Payer: BLUE CROSS/BLUE SHIELD | Source: Ambulatory Visit | Attending: Student in an Organized Health Care Education/Training Program | Admitting: Student in an Organized Health Care Education/Training Program

## 2017-11-29 ENCOUNTER — Encounter: Payer: Self-pay | Admitting: Student in an Organized Health Care Education/Training Program

## 2017-11-29 ENCOUNTER — Ambulatory Visit (HOSPITAL_BASED_OUTPATIENT_CLINIC_OR_DEPARTMENT_OTHER): Payer: BLUE CROSS/BLUE SHIELD | Admitting: Student in an Organized Health Care Education/Training Program

## 2017-11-29 ENCOUNTER — Other Ambulatory Visit: Payer: Self-pay

## 2017-11-29 VITALS — BP 163/96 | HR 80 | Temp 96.8°F | Resp 17 | Ht 68.0 in | Wt 220.0 lb

## 2017-11-29 DIAGNOSIS — M170 Bilateral primary osteoarthritis of knee: Secondary | ICD-10-CM

## 2017-11-29 MED ORDER — DEXAMETHASONE SODIUM PHOSPHATE 10 MG/ML IJ SOLN: 10.0000 mg | Freq: Once | INTRAMUSCULAR | Status: DC

## 2017-11-29 MED ORDER — LIDOCAINE HCL 2 % IJ SOLN
INTRAMUSCULAR | Status: AC
  Filled 2017-11-29: qty 20

## 2017-11-29 MED ORDER — ROPIVACAINE HCL 2 MG/ML IJ SOLN
10.0000 mL | Freq: Once | INTRAMUSCULAR | Status: AC
  Administered 2017-11-29: 10 mL

## 2017-11-29 MED ORDER — DEXAMETHASONE SODIUM PHOSPHATE 10 MG/ML IJ SOLN
10.0000 mg | Freq: Once | INTRAMUSCULAR | Status: AC
  Administered 2017-11-29: 10 mg

## 2017-11-29 MED ORDER — FENTANYL CITRATE (PF) 100 MCG/2ML IJ SOLN
INTRAMUSCULAR | Status: AC
  Filled 2017-11-29: qty 2

## 2017-11-29 MED ORDER — LIDOCAINE HCL 2 % IJ SOLN
20.0000 mL | Freq: Once | INTRAMUSCULAR | Status: AC
  Administered 2017-11-29: 400 mg

## 2017-11-29 MED ORDER — DEXAMETHASONE SODIUM PHOSPHATE 10 MG/ML IJ SOLN
INTRAMUSCULAR | Status: AC
  Filled 2017-11-29: qty 3

## 2017-11-29 MED ORDER — ROPIVACAINE HCL 2 MG/ML IJ SOLN
INTRAMUSCULAR | Status: AC
  Filled 2017-11-29: qty 10

## 2017-11-29 MED ORDER — LACTATED RINGERS IV SOLN
1000.0000 mL | Freq: Once | INTRAVENOUS | Status: AC
  Administered 2017-11-29: 1000 mL via INTRAVENOUS

## 2017-11-29 MED ORDER — FENTANYL CITRATE (PF) 100 MCG/2ML IJ SOLN
25.0000 ug | INTRAMUSCULAR | Status: DC | PRN
  Administered 2017-11-29: 50 ug via INTRAVENOUS

## 2017-11-29 NOTE — Progress Notes (Signed)
Safety precautions to be maintained throughout the outpatient stay will include: orient to surroundings, keep bed in low position, maintain call bell within reach at all times, provide assistance with transfer out of bed and ambulation.  

## 2017-11-29 NOTE — Patient Instructions (Signed)

## 2017-11-29 NOTE — Progress Notes (Signed)
Patient's Name: Pamela Romero  MRN: [id number]  Referring Provider: Ronnell Freshwater, NP  DOB: 1950/08/17  PCP: Ronnell Freshwater, NP  DOS: 11/29/2017  Note by: Gillis Santa, MD  Service setting: Ambulatory outpatient  Specialty: Interventional Pain Management  Patient type: Established  Location: [facility] (AMB) Pain Management Facility  Visit type: Interventional Procedure   Primary Reason for Visit: Interventional Pain Management Treatment. CC: Knee Pain (left and right)  Procedure:          Anesthesia, Analgesia, Anxiolysis:  Type: Genicular Nerves Block (Superior-lateral, Superior-medial, and Inferior-medial Genicular Nerves)          CPT: 17510      Primary Purpose: Diagnostic Region: Lateral, Anterior, and Medial aspects of the knee joint, above and below the knee joint proper. Level: Superior and inferior to the knee joint. Target Area: For Genicular Nerve block(s), the targets are: the superior-lateral genicular nerve, located in the lateral distal portion of the femoral shaft as it curves to form the lateral epicondyle, in the region of the distal femoral metaphysis; the superior-medial genicular nerve, located in the medial distal portion of the femoral shaft as it curves to form the medial epicondyle; and the inferior-medial genicular nerve, located in the medial, proximal portion of the tibial shaft, as it curves to form the medial epicondyle, in the region of the proximal tibial metaphysis. Approach: Anterior, percutaneous, ipsilateral approach. Laterality: Bilateral Position: Modified Fowler's position with pillows under the targeted knee(s).  Type: Moderate (Conscious) Sedation combined with Local Anesthesia Indication(s): Analgesia and Anxiety Route: Intravenous (IV) IV Access: Secured Sedation: Meaningful verbal contact was maintained at all times during the procedure  Local Anesthetic: Lidocaine 1-2%   Indications: 1. Bilateral primary osteoarthritis of knee    Pain  Score: Pre-procedure: 8 /10 Post-procedure: 0-No pain/10  Pre-op Assessment:  Pamela Romero is a 67 y.o. (year old), female patient, seen today for interventional treatment. She  has a past surgical history that includes Thyroidectomy (2005); Cardiac catheterization (2006); Total abdominal hysterectomy; Breast surgery (Right, March 2014); and Breast cyst aspiration (Right). Pamela Romero has a current medication list which includes the following prescription(s): docusate sodium, duloxetine, esomeprazole, hydrocodone-acetaminophen, hydrocodone-acetaminophen, levothyroxine, lisinopril, ranitidine, rosuvastatin, and tramadol, and the following Facility-Administered Medications: dexamethasone and fentanyl. Her primarily concern today is the Knee Pain (left and right)  Initial Vital Signs:  Pulse/HCG Rate: 86ECG Heart Rate: 78 Temp: 98.5 F (36.9 C) Resp: 18 BP: (!) 136/96 SpO2: 98 %  BMI: Estimated body mass index is 33.45 kg/m as calculated from the following:   Height as of this encounter: 5\' 8"  (1.727 m).   Weight as of this encounter: 220 lb (99.8 kg).  Risk Assessment: Allergies: Reviewed. She has No Known Allergies.  Allergy Precautions: None required Coagulopathies: Reviewed. None identified.  Blood-thinner therapy: None at this time Active Infection(s): Reviewed. None identified. Pamela Romero is afebrile  Site Confirmation: Pamela Romero was asked to confirm the procedure and laterality before marking the site Procedure checklist: Completed Consent: Before the procedure and under the influence of no sedative(s), amnesic(s), or anxiolytics, the patient was informed of the treatment options, risks and possible complications. To fulfill our ethical and legal obligations, as recommended by the American Medical Association's Code of Ethics, I have informed the patient of my clinical impression; the nature and purpose of the treatment or procedure; the risks, benefits, and possible  complications of the intervention; the alternatives, including doing nothing; the risk(s) and benefit(s) of the alternative treatment(s) or procedure(s);  and the risk(s) and benefit(s) of doing nothing. The patient was provided information about the general risks and possible complications associated with the procedure. These may include, but are not limited to: failure to achieve desired goals, infection, bleeding, organ or nerve damage, allergic reactions, paralysis, and death. In addition, the patient was informed of those risks and complications associated to the procedure, such as failure to decrease pain; infection; bleeding; organ or nerve damage with subsequent damage to sensory, motor, and/or autonomic systems, resulting in permanent pain, numbness, and/or weakness of one or several areas of the body; allergic reactions; (i.e.: anaphylactic reaction); and/or death. Furthermore, the patient was informed of those risks and complications associated with the medications. These include, but are not limited to: allergic reactions (i.e.: anaphylactic or anaphylactoid reaction(s)); adrenal axis suppression; blood sugar elevation that in diabetics may result in ketoacidosis or comma; water retention that in patients with history of congestive heart failure may result in shortness of breath, pulmonary edema, and decompensation with resultant heart failure; weight gain; swelling or edema; medication-induced neural toxicity; particulate matter embolism and blood vessel occlusion with resultant organ, and/or nervous system infarction; and/or aseptic necrosis of one or more joints. Finally, the patient was informed that Medicine is not an exact science; therefore, there is also the possibility of unforeseen or unpredictable risks and/or possible complications that may result in a catastrophic outcome. The patient indicated having understood very clearly. We have given the patient no guarantees and we have made no  promises. Enough time was given to the patient to ask questions, all of which were answered to the patient's satisfaction. Pamela Romero has indicated that she wanted to continue with the procedure. Attestation: I, the ordering provider, attest that I have discussed with the patient the benefits, risks, side-effects, alternatives, likelihood of achieving goals, and potential problems during recovery for the procedure that I have provided informed consent. Date  Time: 11/29/2017  8:33 AM  Pre-Procedure Preparation:  Monitoring: As per clinic protocol. Respiration, ETCO2, SpO2, BP, heart rate and rhythm monitor placed and checked for adequate function Safety Precautions: Patient was assessed for positional comfort and pressure points before starting the procedure. Time-out: I initiated and conducted the "Time-out" before starting the procedure, as per protocol. The patient was asked to participate by confirming the accuracy of the "Time Out" information. Verification of the correct person, site, and procedure were performed and confirmed by me, the nursing staff, and the patient. "Time-out" conducted as per Joint Commission's Universal Protocol (UP.01.01.01). Time: 0915  Description of Procedure:          Area Prepped: Entire knee area, from mid-thigh to mid-shin, lateral, anterior, and medial aspects. Prepping solution: ChloraPrep (2% chlorhexidine gluconate and 70% isopropyl alcohol) Safety Precautions: Aspiration looking for blood return was conducted prior to all injections. At no point did we inject any substances, as a needle was being advanced. No attempts were made at seeking any paresthesias. Safe injection practices and needle disposal techniques used. Medications properly checked for expiration dates. SDV (single dose vial) medications used. Latex Allergy precautions taken.   Description of the Procedure: Protocol guidelines were followed. The patient was placed in position over the procedure  table. The target area was identified and the area prepped in the usual manner. Skin & deeper tissues infiltrated with local anesthetic. Appropriate amount of time allowed to pass for local anesthetics to take effect. The procedure needles were then advanced to the target area. Proper needle placement secured. Negative aspiration confirmed. Solution  injected in intermittent fashion, asking for systemic symptoms every 0.5cc of injectate. The needles were then removed and the area cleansed, making sure to leave some of the prepping solution back to take advantage of its long term bactericidal properties.  Vitals:   11/29/17 0935 11/29/17 0945 11/29/17 0955 11/29/17 1004  BP: (!) 139/91 (!) 166/86 (!) 139/93 (!) 163/96  Pulse: 80     Resp: 16 14 17 17   Temp:  97.7 F (36.5 C)  (!) 96.8 F (36 C)  TempSrc:      SpO2: 97% 100% 98% 98%  Weight:      Height:        Start Time: 0915 hrs. End Time: 0932 hrs. Materials:  Needle(s) Type: Regular needle Gauge: 22G Length: 3.5-in Medication(s): Please see orders for medications and dosing details. 5 cc solution made of 4 cc of 0.2% ropivacaine, 1 cc of Decadron 10 mg/cc.  1.5 cc injected at each level for the left knee. 5 cc solution made of 4 cc of 0.2% ropivacaine, 1 cc of Decadron 10 mg/cc.  1.5 cc injected at each level for the right knee.  Total steroid dose equals 20 mg for bilateral knees Imaging Guidance (Non-Spinal):          Type of Imaging Technique: Fluoroscopy Guidance (Non-Spinal) Indication(s): Assistance in needle guidance and placement for procedures requiring needle placement in or near specific anatomical locations not easily accessible without such assistance. Exposure Time: Please see nurses notes. Contrast: Before injecting any contrast, we confirmed that the patient did not have an allergy to iodine, shellfish, or radiological contrast. Once satisfactory needle placement was completed at the desired level, radiological  contrast was injected. Contrast injected under live fluoroscopy. No contrast complications. See chart for type and volume of contrast used. Fluoroscopic Guidance: I was personally present during the use of fluoroscopy. "Tunnel Vision Technique" used to obtain the best possible view of the target area. Parallax error corrected before commencing the procedure. "Direction-depth-direction" technique used to introduce the needle under continuous pulsed fluoroscopy. Once target was reached, antero-posterior, oblique, and lateral fluoroscopic projection used confirm needle placement in all planes. Images permanently stored in EMR. Interpretation: I personally interpreted the imaging intraoperatively. Adequate needle placement confirmed in multiple planes. Appropriate spread of contrast into desired area was observed. No evidence of afferent or efferent intravascular uptake. Permanent images saved into the patient's record.  Antibiotic Prophylaxis:   Anti-infectives (From admission, onward)   None     Indication(s): None identified  Post-operative Assessment:  Post-procedure Vital Signs:  Pulse/HCG Rate: 8077 Temp: (!) 96.8 F (36 C) Resp: 17 BP: (!) 163/96 SpO2: 98 %  EBL: None  Complications: No immediate post-treatment complications observed by team, or reported by patient.  Note: The patient tolerated the entire procedure well. A repeat set of vitals were taken after the procedure and the patient was kept under observation following institutional policy, for this type of procedure. Post-procedural neurological assessment was performed, showing return to baseline, prior to discharge. The patient was provided with post-procedure discharge instructions, including a section on how to identify potential problems. Should any problems arise concerning this procedure, the patient was given instructions to immediately contact us, at any time, without hesitation. In any case, we plan to contact the  patient by telephone for a follow-up status report regarding this interventional procedure.  Comments:  No additional relevant information. 5 out of 5 strength bilateral lower extremity: Plantar flexion, dorsiflexion, knee flexion, knee extension.  Plan of  Care    Imaging Orders     DG C-Arm 1-60 Min-No Report Procedure Orders    No procedure(s) ordered today    Medications ordered for procedure: Meds ordered this encounter  Medications  . lactated ringers infusion 1,000 mL  . fentaNYL (SUBLIMAZE) injection 25-100 mcg    Make sure Narcan is available in the pyxis when using this medication. In the event of respiratory depression (RR< 8/min): Titrate NARCAN (naloxone) in increments of 0.1 to 0.2 mg IV at 2-3 minute intervals, until desired degree of reversal.  . lidocaine (XYLOCAINE) 2 % (with pres) injection 400 mg  . ropivacaine (PF) 2 mg/mL (0.2%) (NAROPIN) injection 10 mL  . dexamethasone (DECADRON) injection 10 mg  . dexamethasone (DECADRON) injection 10 mg  . dexamethasone (DECADRON) injection 10 mg   Medications administered: We administered lactated ringers, fentaNYL, lidocaine, ropivacaine (PF) 2 mg/mL (0.2%), dexamethasone, and dexamethasone.  See the medical record for exact dosing, route, and time of administration.  New Prescriptions   No medications on file   Disposition: Discharge home  Discharge Date & Time: 11/29/2017; 1005 hrs.   Physician-requested Follow-up: Return in about 4 weeks (around 12/27/2017) for Post Procedure Evaluation.  Future Appointments  Date Time Provider Comern­o  12/01/2017 11:00 AM Kendell Bane, NP [facility] None  12/27/2017 12:45 PM Gillis Santa, MD [facility] None  01/16/2018  9:30 AM Gillis Santa, MD [facility] None   Primary Care Physician: Ronnell Freshwater, NP Location: [facility] Out[facility] Facility Note by: Gillis Santa, MD Date: 11/29/2017; Time: 11:21 AM  Disclaimer:  Medicine is not an exact science.  The only guarantee in medicine is that nothing is guaranteed. It is important to note that the decision to proceed with this intervention was based on the information collected from the patient. The Data and conclusions were drawn from the patient's questionnaire, the interview, and the physical examination. Because the information was provided in large part by the patient, it cannot be guaranteed that it has not been purposely or unconsciously manipulated. Every effort has been made to obtain as much relevant data as possible for this evaluation. It is important to note that the conclusions that lead to this procedure are derived in large part from the available data. Always take into account that the treatment will also be dependent on availability of resources and existing treatment guidelines, considered by other Pain Management Practitioners as being common knowledge and practice, at the time of the intervention. For Medico-Legal purposes, it is also important to point out that variation in procedural techniques and pharmacological choices are the acceptable norm. The indications, contraindications, technique, and results of the above procedure should only be interpreted and judged by a Board-Certified Interventional Pain Specialist with extensive familiarity and expertise in the same exact procedure and technique.

## 2017-11-30 ENCOUNTER — Telehealth: Payer: Self-pay | Admitting: *Deleted

## 2017-11-30 NOTE — Telephone Encounter (Signed)
No problems post procedure. 

## 2017-12-01 ENCOUNTER — Ambulatory Visit: Payer: Self-pay | Admitting: Adult Health

## 2017-12-27 ENCOUNTER — Other Ambulatory Visit: Payer: Self-pay

## 2017-12-27 ENCOUNTER — Ambulatory Visit
Payer: BLUE CROSS/BLUE SHIELD | Attending: Student in an Organized Health Care Education/Training Program | Admitting: Student in an Organized Health Care Education/Training Program

## 2017-12-27 ENCOUNTER — Encounter: Payer: Self-pay | Admitting: Student in an Organized Health Care Education/Training Program

## 2017-12-27 VITALS — BP 157/105 | HR 87 | Temp 98.2°F | Resp 18 | Ht 68.0 in | Wt 200.0 lb

## 2017-12-27 DIAGNOSIS — K219 Gastro-esophageal reflux disease without esophagitis: Secondary | ICD-10-CM | POA: Diagnosis not present

## 2017-12-27 DIAGNOSIS — E785 Hyperlipidemia, unspecified: Secondary | ICD-10-CM | POA: Insufficient documentation

## 2017-12-27 DIAGNOSIS — E669 Obesity, unspecified: Secondary | ICD-10-CM | POA: Diagnosis not present

## 2017-12-27 DIAGNOSIS — M25571 Pain in right ankle and joints of right foot: Secondary | ICD-10-CM | POA: Diagnosis not present

## 2017-12-27 DIAGNOSIS — Z6830 Body mass index (BMI) 30.0-30.9, adult: Secondary | ICD-10-CM | POA: Insufficient documentation

## 2017-12-27 DIAGNOSIS — M19071 Primary osteoarthritis, right ankle and foot: Secondary | ICD-10-CM | POA: Diagnosis not present

## 2017-12-27 DIAGNOSIS — M170 Bilateral primary osteoarthritis of knee: Secondary | ICD-10-CM | POA: Diagnosis not present

## 2017-12-27 DIAGNOSIS — Z79899 Other long term (current) drug therapy: Secondary | ICD-10-CM | POA: Insufficient documentation

## 2017-12-27 DIAGNOSIS — Z8542 Personal history of malignant neoplasm of other parts of uterus: Secondary | ICD-10-CM | POA: Insufficient documentation

## 2017-12-27 DIAGNOSIS — E039 Hypothyroidism, unspecified: Secondary | ICD-10-CM | POA: Diagnosis not present

## 2017-12-27 DIAGNOSIS — N189 Chronic kidney disease, unspecified: Secondary | ICD-10-CM | POA: Diagnosis not present

## 2017-12-27 DIAGNOSIS — M2141 Flat foot [pes planus] (acquired), right foot: Secondary | ICD-10-CM | POA: Insufficient documentation

## 2017-12-27 DIAGNOSIS — G8929 Other chronic pain: Secondary | ICD-10-CM | POA: Insufficient documentation

## 2017-12-27 DIAGNOSIS — F17210 Nicotine dependence, cigarettes, uncomplicated: Secondary | ICD-10-CM | POA: Insufficient documentation

## 2017-12-27 DIAGNOSIS — M1712 Unilateral primary osteoarthritis, left knee: Secondary | ICD-10-CM

## 2017-12-27 DIAGNOSIS — M064 Inflammatory polyarthropathy: Secondary | ICD-10-CM | POA: Insufficient documentation

## 2017-12-27 DIAGNOSIS — F330 Major depressive disorder, recurrent, mild: Secondary | ICD-10-CM | POA: Diagnosis not present

## 2017-12-27 DIAGNOSIS — I2510 Atherosclerotic heart disease of native coronary artery without angina pectoris: Secondary | ICD-10-CM | POA: Diagnosis not present

## 2017-12-27 DIAGNOSIS — F419 Anxiety disorder, unspecified: Secondary | ICD-10-CM | POA: Insufficient documentation

## 2017-12-27 DIAGNOSIS — I129 Hypertensive chronic kidney disease with stage 1 through stage 4 chronic kidney disease, or unspecified chronic kidney disease: Secondary | ICD-10-CM | POA: Insufficient documentation

## 2017-12-27 NOTE — Patient Instructions (Signed)
____________________________________________________________________________________________  Preparing for your procedure (without sedation)  Instructions: . Oral Intake: Do not eat or drink anything for at least 3 hours prior to your procedure. . Transportation: Unless otherwise stated by your physician, you may drive yourself after the procedure. . Blood Pressure Medicine: Take your blood pressure medicine with a sip of water the morning of the procedure. . Blood thinners: Notify our staff if you are taking any blood thinners. Depending on which one you take, there will be specific instructions on how and when to stop it. . Diabetics on insulin: Notify the staff so that you can be scheduled 1st case in the morning. If your diabetes requires high dose insulin, take only  of your normal insulin dose the morning of the procedure and notify the staff that you have done so. . Preventing infections: Shower with an antibacterial soap the morning of your procedure.  . Build-up your immune system: Take 1000 mg of Vitamin C with every meal (3 times a day) the day prior to your procedure. [name] Antibiotics: Inform the staff if you have a condition or reason that requires you to take antibiotics before dental procedures. . Pregnancy: If you are pregnant, call and cancel the procedure. . Sickness: If you have a cold, fever, or any active infections, call and cancel the procedure. . Arrival: You must be in the facility at least 30 minutes prior to your scheduled procedure. . Children: Do not bring any children with you. . Dress appropriately: Bring dark clothing that you would not mind if they get stained. . Valuables: Do not bring any jewelry or valuables.  Procedure appointments are reserved for interventional treatments only. [name] No Prescription Refills. . No medication changes will be discussed during procedure appointments. . No disability issues will be discussed.  Reasons to call and reschedule or  cancel your procedure: (Following these recommendations will minimize the risk of a serious complication.) . Surgeries: Avoid having procedures within 2 weeks of any surgery. (Avoid for 2 weeks before or after any surgery). . Flu Shots: Avoid having procedures within 2 weeks of a flu shots or . (Avoid for 2 weeks before or after immunizations). . Barium: Avoid having a procedure within 7-10 days after having had a radiological study involving the use of radiological contrast. (Myelograms, Barium swallow or enema study). . Heart attacks: Avoid any elective procedures or surgeries for the initial 6 months after a "Myocardial Infarction" (Heart Attack). . Blood thinners: It is imperative that you stop these medications before procedures. Let us know if you if you take any blood thinner.  . Infection: Avoid procedures during or within two weeks of an infection (including chest colds or gastrointestinal problems). Symptoms associated with infections include: Localized redness, fever, chills, night sweats or profuse sweating, burning sensation when voiding, cough, congestion, stuffiness, runny nose, sore throat, diarrhea, nausea, vomiting, cold or Flu symptoms, recent or current infections. It is specially important if the infection is over the area that we intend to treat. [name] Heart and lung problems: Symptoms that may suggest an active cardiopulmonary problem include: cough, chest pain, breathing difficulties or shortness of breath, dizziness, ankle swelling, uncontrolled high or unusually low blood pressure, and/or palpitations. If you are experiencing any of these symptoms, cancel your procedure and contact your primary care physician for an evaluation.  Remember:  Regular Business hours are:  Monday to Thursday 8:00 AM to 4:00 PM  Provider's Schedule: Milinda Pointer, MD:  Procedure days: Tuesday and Thursday 7:30 AM to  4:00 PM  Bilal Lateef, MD:  Procedure days: Monday and Wednesday 7:30 AM to 4:00  PM ____________________________________________________________________________________________    

## 2017-12-27 NOTE — Progress Notes (Signed)
Safety precautions to be maintained throughout the outpatient stay will include: orient to surroundings, keep bed in low position, maintain call bell within reach at all times, provide assistance with transfer out of bed and ambulation.  

## 2017-12-27 NOTE — Progress Notes (Signed)
Patient's Name: Pamela Romero  MRN: [id number]  Referring Provider: Ronnell Freshwater, NP  DOB: 10/22/1950  PCP: Ronnell Freshwater, NP  DOS: 12/27/2017  Note by: Gillis Santa, MD  Service setting: Ambulatory outpatient  Specialty: Interventional Pain Management  Location: [facility] (AMB) Pain Management Facility    Patient type: Established   Primary Reason(s) for Visit: Encounter for post-procedure evaluation of chronic illness with mild to moderate exacerbation CC: Follow-up  HPI  Pamela Romero is a 67 y.o. year old, female patient, who comes today for a post-procedure evaluation. 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; Hyperlipidemia LDL goal <100; Transient vision disturbance, bilateral; Unspecified constipation; Cough; Dysphagia; Obesity, unspecified; Annual physical exam; S/P breast biopsy; Pes planus of both feet; Shortness of breath; Cellulitis of left lower limb; Edema, unspecified; Inflammatory polyarthropathy ([place]); Major depressive disorder, recurrent, mild ([place]); Chronic kidney disease, unspecified; Anxiety disorder, unspecified; Nicotine dependence, cigarettes, uncomplicated; Chronic pain of left knee; Plantar fascia syndrome; Acute left-sided low back pain without sciatica; Bilateral primary osteoarthritis of knee; Chronic pain of right ankle; and Primary osteoarthritis of right ankle on their problem list. Her primarily concern today is the Follow-up  Pain Assessment: Location: Right, Left Knee Radiating: Radiates to lower right knee Onset: More than a month ago Duration: Chronic pain Quality: Numbness, Aching, Stabbing Severity: 9 (right knee; pain 5 on left knee)/10 (subjective, self-reported pain score)  Note: Reported level is inconsistent with clinical observations. Clinically the patient looks like a 3/10 A 3/10 is viewed as  "Moderate" and described as significantly interfering with activities of daily living (ADL). It becomes difficult to feed, bathe, get dressed, get on and off the toilet or to perform personal hygiene functions. Difficult to get in and out of bed or a chair without assistance. Very distracting. With effort, it can be ignored when deeply involved in activities. Information on the proper use of the pain scale provided to the patient today. When using our objective Pain Scale, levels between 6 and 10/10 are said to belong in an emergency room, as it progressively worsens from a 6/10, described as severely limiting, requiring emergency care not usually available at an outpatient pain management facility. At a 6/10 level, communication becomes difficult and requires great effort. Assistance to reach the emergency department may be required. Facial flushing and profuse sweating along with potentially dangerous increases in heart rate and blood pressure will be evident. Effect on ADL: "Unable to walk so much at work"  Timing: Constant Modifying factors: Procedure BP: (!) 157/105  HR: 87  Pamela Romero comes in today for post-procedure evaluation after the treatment done on 11/30/2017.  Further details on both, my assessment(s), as well as the proposed treatment plan, please see below.  Post-Procedure Assessment  11/29/2017 Procedure: Bilateral genicular nerve block Pre-procedure pain score:  8/10 Post-procedure pain score: 0/10         Influential Factors: BMI: 30.41 kg/m Intra-procedural challenges: None observed.         Assessment challenges: None detected.              Reported side-effects: None.        Post-procedural adverse reactions or complications: None reported         Sedation: Please see nurses note. When no sedatives are used, the analgesic levels obtained are directly associated to the effectiveness of the local anesthetics. However, when sedation is provided, the level of analgesia  obtained during the initial 1 hour following the intervention, is believed to be the result of a combination of factors. These factors may include, but are not limited to: 1. The effectiveness of the local anesthetics used. 2. The effects of the analgesic(s) and/or anxiolytic(s) used. 3. The degree of discomfort experienced by the patient at the time of the procedure. 4. The patients ability and reliability in recalling and recording the events. 5. The presence and influence of possible secondary gains and/or psychosocial factors. Reported result: Relief experienced during the 1st hour after the procedure: 100 % (Ultra-Short Term Relief)            Interpretative annotation: Clinically appropriate result. Analgesia during this period is likely to be Local Anesthetic and/or IV Sedative (Analgesic/Anxiolytic) related.          Effects of local anesthetic: The analgesic effects attained during this period are directly associated to the localized infiltration of local anesthetics and therefore cary significant diagnostic value as to the etiological location, or anatomical origin, of the pain. Expected duration of relief is directly dependent on the pharmacodynamics of the local anesthetic used. Long-acting (4-6 hours) anesthetics used.  Reported result: Relief during the next 4 to 6 hour after the procedure: 100 % (Short-Term Relief)            Interpretative annotation: Clinically appropriate result. Analgesia during this period is likely to be Local Anesthetic-related.          Long-term benefit: Defined as the period of time past the expected duration of local anesthetics (1 hour for short-acting and 4-6 hours for long-acting). With the possible exception of prolonged sympathetic blockade from the local anesthetics, benefits during this period are typically attributed to, or associated with, other factors such as analgesic sensory neuropraxia, antiinflammatory effects, or beneficial biochemical changes  provided by agents other than the local anesthetics.  Reported result: Extended relief following procedure: (70% on Left Knee; 0% on Right Knee) (Long-Term Relief)            Interpretative annotation: Clinically possible results. Good relief. No permanent benefit expected. Inflammation plays a part in the etiology to the pain.          Current benefits: Defined as reported results that persistent at this point in time.   Analgesia: Greater than 70% for left knee, 0% for right knee  Function: Somewhat improved ROM: Somewhat improved Interpretative annotation:   Good pain relief for the left knee, not for the right knee Effective diagnostic intervention.          Interpretation: Results would suggest a successful diagnostic intervention.                  Plan:   Repeat procedure for the right knee; right knee genicular nerve block #2.                Laboratory Chemistry  Inflammation Markers (CRP: Acute Phase) (ESR: Chronic Phase) Lab Results  Component Value Date   ESRSEDRATE 19 03/30/2012                         Rheumatology Markers No results found for: RF, ANA, LABURIC, URICUR, LYMEIGGIGMAB, LYMEABIGMQN, HLAB27                      Renal Function Markers Lab Results  Component Value Date   BUN 19 08/29/2017   CREATININE 1.13 (H) 08/29/2017   BCR 17 08/29/2017  GFRAA 59 (L) 08/29/2017   GFRNONAA 51 (L) 08/29/2017                             Hepatic Function Markers Lab Results  Component Value Date   AST 15 08/29/2017   ALT 18 08/29/2017   ALBUMIN 4.4 08/29/2017   ALKPHOS 126 (H) 08/29/2017   HCVAB NEGATIVE 01/21/2015                        Electrolytes Lab Results  Component Value Date   NA 141 08/29/2017   K 3.8 08/29/2017   CL 102 08/29/2017   CALCIUM 9.3 08/29/2017   MG 1.7 08/29/2017                        Neuropathy Markers Lab Results  Component Value Date   VITAMINB12 224 (L) 08/29/2017   HGBA1C 4.8 01/21/2015   HIV NONREACTIVE 01/21/2015                         CNS Tests No results found for: COLORCSF, APPEARCSF, RBCCOUNTCSF, WBCCSF, POLYSCSF, LYMPHSCSF, EOSCSF, PROTEINCSF, GLUCCSF, JCVIRUS, CSFOLI, IGGCSF                      Bone Pathology Markers Lab Results  Component Value Date   VD25OH 26.66 (L) 05/14/2014                         Coagulation Parameters Lab Results  Component Value Date   PLT 127.0 (L) 09/16/2013                        Cardiovascular Markers Lab Results  Component Value Date   HGB 12.1 09/16/2013   HCT 36.3 09/16/2013                         CA Markers No results found for: CEA, CA125, LABCA2                      Note: Lab results reviewed.  Recent Diagnostic Imaging Results  DG C-Arm 1-60 Min-No Report Fluoroscopy was utilized by the requesting physician.  No radiographic  interpretation.   Complexity Note: Imaging results reviewed. Results shared with Ms. Marsolek, using Layman's terms.                         Meds   Current Outpatient Medications:  .  docusate sodium (COLACE) 100 MG capsule, Take 100 mg by mouth 2 (two) times daily as needed., Disp: , Rfl:  .  DULoxetine (CYMBALTA) 20 MG capsule, Take 20 mg by mouth daily., Disp: , Rfl:  .  esomeprazole (NEXIUM) 40 MG capsule, Take 1 capsule (40 mg total) by mouth daily., Disp: 30 capsule, Rfl: 3 .  HYDROcodone-acetaminophen (NORCO) 7.5-325 MG tablet, Take 1 tablet by mouth 3 (three) times daily as needed for moderate pain., Disp: 90 tablet, Rfl: 0 .  levothyroxine (SYNTHROID, LEVOTHROID) 150 MCG tablet, Take 2 tablets (300 mcg total) by mouth daily., Disp: 60 tablet, Rfl: 3 .  lisinopril (PRINIVIL,ZESTRIL) 10 MG tablet, Take 1 tablet (10 mg total) by mouth daily., Disp: 30 tablet, Rfl: 3 .  ranitidine (ZANTAC) 150 MG capsule, Take by mouth., Disp: ,  Rfl:  .  rosuvastatin (CRESTOR) 5 MG tablet, Take 5 mg by mouth at bedtime., Disp: , Rfl:  .  traMADol (ULTRAM) 50 MG tablet, Take 2 tablets (100 mg total) by mouth every 6 (six) hours as  needed., Disp: 120 tablet, Rfl: 1  ROS  Constitutional: Denies any fever or chills Gastrointestinal: No reported hemesis, hematochezia, vomiting, or acute GI distress Musculoskeletal: Denies any acute onset joint swelling, redness, loss of ROM, or weakness Neurological: No reported episodes of acute onset apraxia, aphasia, dysarthria, agnosia, amnesia, paralysis, loss of coordination, or loss of consciousness  Allergies  Ms. Justiniano has No Known Allergies.  [place]  Drug: Ms. Muchow  reports that she does not use drugs. Alcohol:  reports that she does not drink alcohol. Tobacco:  reports that she has been smoking. She has never used smokeless tobacco. Medical:  has a past medical history of Abnormal EKG, CAD (coronary artery disease), Cancer ([place]), Endometrial adenocarcinoma ([place]) (2011), Hyperlipidemia, Hypertension, Osteoarthritis, Reflux gastritis, Thyroid disease, tobacco abuse, and Vaginal delivery. Surgical: Ms. Galeno  has a past surgical history that includes Thyroidectomy (2005); Cardiac catheterization (2006); Total abdominal hysterectomy; Breast surgery (Right, March 2014); and Breast cyst aspiration (Right). Family: family history includes Alcohol abuse in her father, maternal aunt, and mother; Cancer in her brother, brother, and maternal aunt; Heart disease in her brother.  Constitutional Exam  General appearance: Well nourished, well developed, and well hydrated. In no apparent acute distress Vitals:   12/27/17 1139  BP: (!) 157/105  Pulse: 87  Resp: 18  Temp: 98.2 F (36.8 C)  Weight: 200 lb (90.7 kg)  Height: 5' 8" (1.727 m)   BMI Assessment: Estimated body mass index is 30.41 kg/m as calculated from the following:   Height as of this encounter: 5' 8" (1.727 m).   Weight as of this encounter: 200 lb (90.7 kg).  BMI interpretation table: BMI level Category Range association with higher incidence of chronic pain  <18 kg/m2 Underweight   18.5-24.9 kg/m2 Ideal  body weight   25-29.9 kg/m2 Overweight Increased incidence by 20%  30-34.9 kg/m2 Obese (Class I) Increased incidence by 68%  35-39.9 kg/m2 Severe obesity (Class II) Increased incidence by 136%  >40 kg/m2 Extreme obesity (Class III) Increased incidence by 254%   Patient's current BMI Ideal Body weight  Body mass index is 30.41 kg/m. Ideal body weight: 63.9 kg (140 lb 14 oz) Adjusted ideal body weight: 74.6 kg (164 lb 8.4 oz)   BMI Readings from Last 4 Encounters:  12/27/17 30.41 kg/m  11/29/17 33.45 kg/m  11/16/17 31.93 kg/m  09/21/17 33.30 kg/m   Wt Readings from Last 4 Encounters:  12/27/17 200 lb (90.7 kg)  11/29/17 220 lb (99.8 kg)  11/16/17 210 lb (95.3 kg)  09/21/17 219 lb (99.3 kg)  Psych/Mental status: Alert, oriented x 3 (person, place, & time)       Eyes: PERLA Respiratory: No evidence of acute respiratory distress  Cervical Spine Area Exam  Skin & Axial Inspection: No masses, redness, edema, swelling, or associated skin lesions Alignment: Symmetrical Functional ROM: Unrestricted ROM      Stability: No instability detected Muscle Tone/Strength: Functionally intact. No obvious neuro-muscular anomalies detected. Sensory (Neurological): Unimpaired Palpation: No palpable anomalies              Upper Extremity (UE) Exam    Side: Right upper extremity  Side: Left upper extremity  Skin & Extremity Inspection: Skin color, temperature, and hair growth are WNL. No peripheral edema  or cyanosis. No masses, redness, swelling, asymmetry, or associated skin lesions. No contractures.  Skin & Extremity Inspection: Skin color, temperature, and hair growth are WNL. No peripheral edema or cyanosis. No masses, redness, swelling, asymmetry, or associated skin lesions. No contractures.  Functional ROM: Unrestricted ROM          Functional ROM: Unrestricted ROM          Muscle Tone/Strength: Functionally intact. No obvious neuro-muscular anomalies detected.  Muscle Tone/Strength:  Functionally intact. No obvious neuro-muscular anomalies detected.  Sensory (Neurological): Unimpaired          Sensory (Neurological): Unimpaired          Palpation: No palpable anomalies              Palpation: No palpable anomalies              Provocative Test(s):  Phalen's test: deferred Tinel's test: deferred Apley's scratch test (touch opposite shoulder):  Action 1 (Across chest): deferred Action 2 (Overhead): deferred Action 3 (LB reach): deferred   Provocative Test(s):  Phalen's test: deferred Tinel's test: deferred Apley's scratch test (touch opposite shoulder):  Action 1 (Across chest): deferred Action 2 (Overhead): deferred Action 3 (LB reach): deferred    Thoracic Spine Area Exam  Skin & Axial Inspection: No masses, redness, or swelling Alignment: Symmetrical Functional ROM: Unrestricted ROM Stability: No instability detected Muscle Tone/Strength: Functionally intact. No obvious neuro-muscular anomalies detected. Sensory (Neurological): Unimpaired Muscle strength & Tone: No palpable anomalies  Lumbar Spine Area Exam  Skin & Axial Inspection: No masses, redness, or swelling Alignment: Symmetrical Functional ROM: Unrestricted ROM       Stability: No instability detected Muscle Tone/Strength: Functionally intact. No obvious neuro-muscular anomalies detected. Sensory (Neurological): Unimpaired Palpation: No palpable anomalies       Provocative Tests: Hyperextension/rotation test: deferred today       Lumbar quadrant test (Kemp's test): deferred today       Lateral bending test: deferred today       Patrick's Maneuver: deferred today                   FABER test: deferred today                   S-I anterior distraction/compression test: deferred today         S-I lateral compression test: deferred today         S-I Thigh-thrust test: deferred today         S-I Gaenslen's test: deferred today          Gait & Posture Assessment  Ambulation: Unassisted Gait:  Relatively normal for age and body habitus Posture: WNL   Lower Extremity Exam    Side: Right lower extremity  Side: Left lower extremity  Stability: No instability observed          Stability: No instability observed          Skin & Extremity Inspection: Skin color, temperature, and hair growth are WNL. No peripheral edema or cyanosis. No masses, redness, swelling, asymmetry, or associated skin lesions. No contractures.  Skin & Extremity Inspection: Skin color, temperature, and hair growth are WNL. No peripheral edema or cyanosis. No masses, redness, swelling, asymmetry, or associated skin lesions. No contractures.  Functional ROM: Decreased ROM for knee joint          Functional ROM: Improved after treatment  Muscle Tone/Strength: Functionally intact. No obvious neuro-muscular anomalies detected.  Muscle Tone/Strength: Functionally intact. No obvious neuro-muscular anomalies detected.  Sensory (Neurological): Arthropathic arthralgia  Sensory (Neurological): Improved  Palpation: No palpable anomalies  Palpation: No palpable anomalies   Assessment  Primary Diagnosis & Pertinent Problem List: The primary encounter diagnosis was Chronic pain of right ankle. Diagnoses of Bilateral primary osteoarthritis of knee, Primary osteoarthritis of right ankle, and Primary osteoarthritis of left knee were also pertinent to this visit.  Status Diagnosis  Persistent Persistent Persistent 1. Chronic pain of right ankle   2. Bilateral primary osteoarthritis of knee   3. Primary osteoarthritis of right ankle   4. Primary osteoarthritis of left knee      67 year old female with a history of bilateral knee osteoarthritis who follows up status post bilateral genicular nerve block who is endorsing significant pain relief in regards to her left knee but not her right knee.  We discussed repeating genicular nerve block for her right knee and adding additional volume to see if she can obtain pain  relief.  Patient also endorsing right ankle and right foot pain.  This is worse with ankle inversion.  I will obtain x-rays of her right ankle and right foot.  Plan: -Repeat genicular nerve block for the right knee only -X-rays of right ankle and right foot -Continue medications as prescribed  Problems updated and reviewed during this visit: Problem  Bilateral Primary Osteoarthritis of Knee  Chronic Pain of Right Ankle  Primary Osteoarthritis of Right Ankle   Plan of Care  Pharmacotherapy (Medications Ordered): No orders of the defined types were placed in this encounter.  Lab-work, procedure(s), and/or referral(s): Orders Placed This Encounter  Procedures  . GENICULAR NERVE BLOCK  . DG Foot Complete Right  . DG Ankle Complete Right   Provider-requested follow-up: Return in about 2 weeks (around 01/10/2018) for Procedure.  Time Note: Greater than 50% of the 25 minute(s) of face-to-face time spent with Ms. Alkhatib, was spent in counseling/coordination of care regarding: Ms. Myhand primary cause of pain, the treatment plan, treatment alternatives, the risks and possible complications of proposed treatment, the results, interpretation and significance of  her recent diagnostic interventional treatment(s) and the goals of pain management (increased in functionality).  Future Appointments  Date Time Provider [place]  01/10/2018  9:30 AM Gillis Santa, MD [facility] None  01/16/2018  9:30 AM Gillis Santa, MD [facility] None    Primary Care Physician: Ronnell Freshwater, NP Location: [facility] Note by: Gillis Santa, M.D Date: 12/27/2017; Time: 2:01 PM  Patient Instructions  ____________________________________________________________________________________________  Preparing for your procedure (without sedation)  Instructions: . Oral Intake: Do not eat or drink anything for at least 3 hours prior to your procedure. . Transportation:  Unless otherwise stated by your physician, you may drive yourself after the procedure. . Blood Pressure Medicine: Take your blood pressure medicine with a sip of water the morning of the procedure. . Blood thinners: Notify our staff if you are taking any blood thinners. Depending on which one you take, there will be specific instructions on how and when to stop it. . Diabetics on insulin: Notify the staff so that you can be scheduled 1st case in the morning. If your diabetes requires high dose insulin, take only  of your normal insulin dose the morning of the procedure and notify the staff that you have done so. . Preventing infections: Shower with an antibacterial soap the morning of your procedure.  [name]  Build-up your immune system: Take 1000 mg of Vitamin C with every meal (3 times a day) the day prior to your procedure. [name] Antibiotics: Inform the staff if you have a condition or reason that requires you to take antibiotics before dental procedures. . Pregnancy: If you are pregnant, call and cancel the procedure. . Sickness: If you have a cold, fever, or any active infections, call and cancel the procedure. . Arrival: You must be in the facility at least 30 minutes prior to your scheduled procedure. . Children: Do not bring any children with you. . Dress appropriately: Bring dark clothing that you would not mind if they get stained. . Valuables: Do not bring any jewelry or valuables.  Procedure appointments are reserved for interventional treatments only. [name] No Prescription Refills. . No medication changes will be discussed during procedure appointments. . No disability issues will be discussed.  Reasons to call and reschedule or cancel your procedure: (Following these recommendations will minimize the risk of a serious complication.) . Surgeries: Avoid having procedures within 2 weeks of any surgery. (Avoid for 2 weeks before or after any surgery). . Flu Shots: Avoid having procedures within 2  weeks of a flu shots or . (Avoid for 2 weeks before or after immunizations). . Barium: Avoid having a procedure within 7-10 days after having had a radiological study involving the use of radiological contrast. (Myelograms, Barium swallow or enema study). . Heart attacks: Avoid any elective procedures or surgeries for the initial 6 months after a "Myocardial Infarction" (Heart Attack). . Blood thinners: It is imperative that you stop these medications before procedures. Let us know if you if you take any blood thinner.  . Infection: Avoid procedures during or within two weeks of an infection (including chest colds or gastrointestinal problems). Symptoms associated with infections include: Localized redness, fever, chills, night sweats or profuse sweating, burning sensation when voiding, cough, congestion, stuffiness, runny nose, sore throat, diarrhea, nausea, vomiting, cold or Flu symptoms, recent or current infections. It is specially important if the infection is over the area that we intend to treat. [name] Heart and lung problems: Symptoms that may suggest an active cardiopulmonary problem include: cough, chest pain, breathing difficulties or shortness of breath, dizziness, ankle swelling, uncontrolled high or unusually low blood pressure, and/or palpitations. If you are experiencing any of these symptoms, cancel your procedure and contact your primary care physician for an evaluation.  Remember:  Regular Business hours are:  Monday to Thursday 8:00 AM to 4:00 PM  Provider's Schedule: Milinda Pointer, MD:  Procedure days: Tuesday and Thursday 7:30 AM to 4:00 PM  Gillis Santa, MD:  Procedure days: Monday and Wednesday 7:30 AM to 4:00 PM ____________________________________________________________________________________________

## 2018-01-10 ENCOUNTER — Encounter: Payer: Self-pay | Admitting: Student in an Organized Health Care Education/Training Program

## 2018-01-10 ENCOUNTER — Ambulatory Visit (HOSPITAL_BASED_OUTPATIENT_CLINIC_OR_DEPARTMENT_OTHER): Payer: BLUE CROSS/BLUE SHIELD | Admitting: Student in an Organized Health Care Education/Training Program

## 2018-01-10 ENCOUNTER — Ambulatory Visit
Admission: RE | Admit: 2018-01-10 | Discharge: 2018-01-10 | Disposition: A | Discharge status: Home / Self Care | Payer: BLUE CROSS/BLUE SHIELD | Source: Ambulatory Visit | Attending: Student in an Organized Health Care Education/Training Program | Admitting: Student in an Organized Health Care Education/Training Program

## 2018-01-10 ENCOUNTER — Other Ambulatory Visit: Payer: Self-pay

## 2018-01-10 VITALS — BP 124/84 | HR 72 | Temp 98.8°F | Resp 16 | Ht 68.0 in | Wt 205.0 lb

## 2018-01-10 DIAGNOSIS — E890 Postprocedural hypothyroidism: Secondary | ICD-10-CM | POA: Diagnosis not present

## 2018-01-10 DIAGNOSIS — M170 Bilateral primary osteoarthritis of knee: Secondary | ICD-10-CM | POA: Diagnosis not present

## 2018-01-10 DIAGNOSIS — Z79890 Hormone replacement therapy (postmenopausal): Secondary | ICD-10-CM | POA: Diagnosis not present

## 2018-01-10 DIAGNOSIS — Z79891 Long term (current) use of opiate analgesic: Secondary | ICD-10-CM | POA: Diagnosis not present

## 2018-01-10 DIAGNOSIS — M19071 Primary osteoarthritis, right ankle and foot: Secondary | ICD-10-CM | POA: Insufficient documentation

## 2018-01-10 DIAGNOSIS — Z79899 Other long term (current) drug therapy: Secondary | ICD-10-CM | POA: Diagnosis not present

## 2018-01-10 DIAGNOSIS — M79604 Pain in right leg: Secondary | ICD-10-CM | POA: Diagnosis present

## 2018-01-10 DIAGNOSIS — Z90710 Acquired absence of both cervix and uterus: Secondary | ICD-10-CM | POA: Insufficient documentation

## 2018-01-10 DIAGNOSIS — M1712 Unilateral primary osteoarthritis, left knee: Secondary | ICD-10-CM

## 2018-01-10 MED ORDER — ROPIVACAINE HCL 2 MG/ML IJ SOLN
INTRAMUSCULAR | Status: AC
  Filled 2018-01-10: qty 10

## 2018-01-10 MED ORDER — HYDROCODONE-ACETAMINOPHEN 7.5-325 MG PO TABS: 1.0000 | ORAL_TABLET | Freq: Three times a day (TID) | ORAL | 0 refills | Status: AC | PRN

## 2018-01-10 MED ORDER — HYDROCODONE-ACETAMINOPHEN 7.5-325 MG PO TABS: 1.0000 | ORAL_TABLET | Freq: Three times a day (TID) | ORAL | 0 refills | Status: DC | PRN

## 2018-01-10 MED ORDER — ROPIVACAINE HCL 2 MG/ML IJ SOLN
10.0000 mL | Freq: Once | INTRAMUSCULAR | Status: AC
  Administered 2018-01-10: 10 mL

## 2018-01-10 MED ORDER — LIDOCAINE HCL 2 % IJ SOLN
20.0000 mL | Freq: Once | INTRAMUSCULAR | Status: AC
  Administered 2018-01-10: 400 mg

## 2018-01-10 MED ORDER — DEXAMETHASONE SODIUM PHOSPHATE 10 MG/ML IJ SOLN
10.0000 mg | Freq: Once | INTRAMUSCULAR | Status: AC
  Administered 2018-01-10: 10 mg

## 2018-01-10 MED ORDER — LIDOCAINE HCL 2 % IJ SOLN
INTRAMUSCULAR | Status: AC
  Filled 2018-01-10: qty 20

## 2018-01-10 MED ORDER — DEXAMETHASONE SODIUM PHOSPHATE 10 MG/ML IJ SOLN
INTRAMUSCULAR | Status: AC
  Filled 2018-01-10: qty 1

## 2018-01-10 NOTE — Patient Instructions (Signed)
____________________________________________________________________________________________  Post-Procedure Information  What to expect: Most procedures involve the use of a local anesthetic (numbing medicine), and a steroid (anti-inflammatory medicine).  The local anesthetics may cause temporary numbness and weakness of the legs or arms, depending on the location of the block. This numbness/weakness may last 4-6 hours, depending on the local anesthetic used. In rare instances, it can last up to 24 hours. While numb, you must be very careful not to injure the extremity.  After any procedure, you could expect the pain to get better within 15-20 minutes. This relief is temporary and may last 4-6 hours. Once the local anesthetics wears off, you could experience discomfort, possibly more than usual, for up to 10 (ten) days. In the case of radiofrequencies, it may last up to 6 weeks. Surgeries may take up to 8 weeks for the healing process. The discomfort is due to the irritation caused by needles going through skin and muscle. To minimize the discomfort, we recommend using ice the first day, and heat from then on. The ice should be applied for 15 minutes on, and 15 minutes off. Keep repeating this cycle until bedtime. Avoid applying the ice directly to the skin, to prevent frostbite. Heat should be used daily, until the pain improves (4-10 days). Be careful not to burn yourself.  Occasionally you may experience muscle spasms or cramps. These occur as a consequence of the irritation caused by the needle sticks to the muscle and the blood that will inevitably be lost into the surrounding muscle tissue. Blood tends to be very irritating to tissues, which tend to react by going into spasm. These spasms may start the same day of your procedure, but they may also take days to develop. This late onset type of spasm or cramp is usually caused by electrolyte imbalances triggered by the steroids, at the level of the  kidney. Cramps and spasms tend to respond well to muscle relaxants, multivitamins (some are triggered by the procedure, but may have their origins in vitamin deficiencies), and "Gatorade", or any sports drinks that can replenish any electrolyte imbalances. (If you are a diabetic, ask your pharmacist to get you a sugar-free brand.) Warm showers or baths may also be helpful. Stretching exercises are highly recommended.  General Instructions:  Be alert for signs of possible infection: redness, swelling, heat, red streaks, elevated temperature, and/or fever. These typically appear 4 to 6 days after the procedure. Immediately notify your doctor if you experience unusual bleeding, difficulty breathing, or loss of bowel or bladder control. If you experience increased pain, do not increase your pain medicine intake, unless instructed by your pain physician.  Post-Procedure Care:  Be careful in moving about. Muscle spasms in the area of the injection may occur. Applying ice or heat to the area is often helpful. The incidence of spinal headaches after epidural injections ranges between 1.4% and 6%. If you develop a headache that does not seem to respond to conservative therapy, please let your physician know. This can be treated with an epidural blood patch.   Post-procedure numbness or redness is to be expected, however it should average 4 to 6 hours. If numbness and weakness of your extremities begins to develop 4 to 6 hours after your procedure, and is felt to be progressing and worsening, immediately contact your physician.    Diet:  If you experience nausea, do not eat until this sensation goes away. If you had a "Stellate Ganglion Block" for upper extremity "Reflex Sympathetic Dystrophy", do  not eat or drink until your hoarseness goes away. In any case, always start with liquids first and if you tolerate them well, then slowly progress to more solid foods.  Activity:  For the first 4 to 6 hours after the  procedure, use caution in moving about as you may experience numbness and/or weakness. Use caution in cooking, using household electrical appliances, and climbing steps.  If you need to reach your Doctor call our office: [phone] Monday-Thursday 8:00 am - 4:00 PM    Fridays: Closed      In case of an emergency: In case of emergency, call 911 or go to the nearest emergency room and have the physician there call us.  Interpretation of Procedure Every nerve block has two components: a diagnostic component, and a treatment component. Unrealistic expectations are the most common causes of "perceived failure".  In a perfect world, a single nerve block should be able to completely and permanently eliminate the pain. Sadly, the world is not perfect.  Most pain management nerve blocks are performed using local anesthetics and steroids. Steroids are responsible for any long-term benefit that you may experience. Their purpose is to decrease any chronic swelling that may exist in the area. Steroids begin to work immediately after being injected. However, most patients will not experience any benefits until 5 to 10 days after the injection, when the swelling has come down to the point where they can tell a difference. Steroids will only help if there is swelling to be treated. As such, they can assist with the diagnosis. If effective, they suggest an inflammatory component to the pain, and if ineffective, they rule out inflammation as the main cause or component of the problem. If the problem is one of mechanical compression, you will get no benefit from those steroids.   In the case of local anesthetics, they have a crucial role in the diagnosis of your condition. Most will begin to work within15 to 20 minutes after injection. The duration will depend on the type used (short- vs. Long-acting). It is of outmost importance that patients keep tract of their pain, after the procedure. To assist with this matter,  a "Post-procedure Pain Diary" is provided. Make sure to complete it and to bring it back to your follow-up appointment.  As long as the patient keeps accurate, detailed records of their symptoms after every procedure, and returns to have those interpreted, every procedure will provide [place] with invaluable information. Even a block that does not provide the patient with any relief, will always provide [place] with information about the mechanism and the origin of the pain. The only time a nerve block can be considered a waste of time is when patients do not keep track of the results, or do not keep their post-procedure appointment.  Reporting the results back to your physician The Pain Score  Pain is a subjective complaint. It cannot be seen, touched, or measured. We depend entirely on the patient's report of the pain in order to assess your condition and treatment. To evaluate the pain, we use a pain scale, where "0" means "No Pain", and a "10" is "the worst possible pain that you can even imagine" (i.e. something like been eaten alive by a shark or being torn apart by a lion).   You will frequently be asked to rate your pain. Please be as accurate, remember that medical decisions will be based on your responses. Please do not rate your pain above a 10. Doing  so is actually interpreted as "symptom magnification" (exaggeration), as well as lack of understanding with regards to the scale. To put this into perspective, when you tell us that your pain is at a 10 (ten), what you are saying is that there is nothing we can do to make this pain any worse. (Carefully think about that.)  Call if:  You experience numbness and weakness that gets worse with time, as opposed to wearing off.  New onset bowel or bladder incontinence. (This applies to Spinal procedures only)  Emergency Numbers:  [place] business hours (Monday - Thursday, 8:00 AM - 4:00 PM) (Friday, 9:00 AM - 12:00 Noon): [phone]  After hours:  [phone] ____________________________________________________________________________________________

## 2018-01-10 NOTE — Progress Notes (Signed)
Patient's Name: Pamela Romero  MRN: [id number]  Referring Provider: Ronnell Freshwater, NP  DOB: January 10, 1951  PCP: Pamela Freshwater, NP  DOS: 01/10/2018  Note by: Pamela Santa, MD  Service setting: Ambulatory outpatient  Specialty: Interventional Pain Management  Patient type: Established  Location: [facility] (AMB) Pain Management Facility  Visit type: Interventional Procedure   Primary Reason for Visit: Interventional Pain Management Treatment. CC: Leg Pain (right)  Procedure:          Anesthesia, Analgesia, Anxiolysis:  Type: Genicular Nerves Block (Superior-lateral, Superior-medial, and Inferior-medial Genicular Nerves) #2  CPT: 63149      Primary Purpose: Diagnostic Region: Lateral, Anterior, and Medial aspects of the knee joint, above and below the knee joint proper. Level: Superior and inferior to the knee joint. Target Area: For Genicular Nerve block(s), the targets are: the superior-lateral genicular nerve, located in the lateral distal portion of the femoral shaft as it curves to form the lateral epicondyle, in the region of the distal femoral metaphysis; the superior-medial genicular nerve, located in the medial distal portion of the femoral shaft as it curves to form the medial epicondyle; and the inferior-medial genicular nerve, located in the medial, proximal portion of the tibial shaft, as it curves to form the medial epicondyle, in the region of the proximal tibial metaphysis. Approach: Anterior, percutaneous, ipsilateral approach. Laterality: Right knee Position: Modified Fowler's position with pillows under the targeted knee(s).  Type: Local Anesthesia Indication(s): Analgesia         Route: Infiltration ([place]/IM) IV Access: Declined Sedation: Declined  Local Anesthetic: Lidocaine 1-2%   Indications: 1. Bilateral primary osteoarthritis of knee   2. Primary osteoarthritis of right ankle   3. Primary osteoarthritis of left knee    Pain Score: Pre-procedure: 10-Worst pain  ever/10 Post-procedure: 0-No pain/10  Patient is still endorsing pain relief in regards to her left knee pain after her first left knee genicular nerve block.  She is still having progressive pain in her right knee.  She states that this pain is making it difficult for her to walk.  We will plan for repeat genicular nerve block #2 today.  I will also refill her chronic pain medications as below.  Pamela Romero PMP checked and appropriate.  UDS up-to-date and appropriate.  Pre-op Assessment:  Pamela Romero is a 67 y.o. (year old), female patient, seen today for interventional treatment. She  has a past surgical history that includes Thyroidectomy (2005); Cardiac catheterization (2006); Total abdominal hysterectomy; Breast surgery (Right, March 2014); and Breast cyst aspiration (Right). Pamela Romero has a current medication list which includes the following prescription(s): docusate sodium, duloxetine, esomeprazole, levothyroxine, lisinopril, ranitidine, rosuvastatin, tramadol, hydrocodone-acetaminophen, and hydrocodone-acetaminophen. Her primarily concern today is the Leg Pain (right)  Initial Vital Signs:  Pulse/HCG Rate: 85  Temp: 98.8 F (37.1 C) Resp: 18 BP: (!) 140/104 SpO2: 97 %  BMI: Estimated body mass index is 31.17 kg/m as calculated from the following:   Height as of this encounter: 5\' 8"  (1.727 m).   Weight as of this encounter: 205 lb (93 kg).  Risk Assessment: Allergies: Reviewed. She has No Known Allergies.  Allergy Precautions: None required Coagulopathies: Reviewed. None identified.  Blood-thinner therapy: None at this time Active Infection(s): Reviewed. None identified. Pamela Romero is afebrile  Site Confirmation: Pamela Romero was asked to confirm the procedure and laterality before marking the site Procedure checklist: Completed Consent: Before the procedure and under the influence of no sedative(s), amnesic(s), or anxiolytics, the patient was  informed of the treatment  options, risks and possible complications. To fulfill our ethical and legal obligations, as recommended by the American Medical Association's Code of Ethics, I have informed the patient of my clinical impression; the nature and purpose of the treatment or procedure; the risks, benefits, and possible complications of the intervention; the alternatives, including doing nothing; the risk(s) and benefit(s) of the alternative treatment(s) or procedure(s); and the risk(s) and benefit(s) of doing nothing. The patient was provided information about the general risks and possible complications associated with the procedure. These may include, but are not limited to: failure to achieve desired goals, infection, bleeding, organ or nerve damage, allergic reactions, paralysis, and death. In addition, the patient was informed of those risks and complications associated to the procedure, such as failure to decrease pain; infection; bleeding; organ or nerve damage with subsequent damage to sensory, motor, and/or autonomic systems, resulting in permanent pain, numbness, and/or weakness of one or several areas of the body; allergic reactions; (i.e.: anaphylactic reaction); and/or death. Furthermore, the patient was informed of those risks and complications associated with the medications. These include, but are not limited to: allergic reactions (i.e.: anaphylactic or anaphylactoid reaction(s)); adrenal axis suppression; blood sugar elevation that in diabetics may result in ketoacidosis or comma; water retention that in patients with history of congestive heart failure may result in shortness of breath, pulmonary edema, and decompensation with resultant heart failure; weight gain; swelling or edema; medication-induced neural toxicity; particulate matter embolism and blood vessel occlusion with resultant organ, and/or nervous system infarction; and/or aseptic necrosis of one or more joints. Finally, the patient was informed that  Medicine is not an exact science; therefore, there is also the possibility of unforeseen or unpredictable risks and/or possible complications that may result in a catastrophic outcome. The patient indicated having understood very clearly. We have given the patient no guarantees and we have made no promises. Enough time was given to the patient to ask questions, all of which were answered to the patient's satisfaction. Ms. Stalling has indicated that she wanted to continue with the procedure. Attestation: I, the ordering provider, attest that I have discussed with the patient the benefits, risks, side-effects, alternatives, likelihood of achieving goals, and potential problems during recovery for the procedure that I have provided informed consent. Date  Time:   Pre-Procedure Preparation:  Monitoring: As per clinic protocol. Respiration, ETCO2, SpO2, BP, heart rate and rhythm monitor placed and checked for adequate function Safety Precautions: Patient was assessed for positional comfort and pressure points before starting the procedure. Time-out: I initiated and conducted the "Time-out" before starting the procedure, as per protocol. The patient was asked to participate by confirming the accuracy of the "Time Out" information. Verification of the correct person, site, and procedure were performed and confirmed by me, the nursing staff, and the patient. "Time-out" conducted as per Joint Commission's Universal Protocol (UP.01.01.01). Time: 1035  Description of Procedure:          Area Prepped: Entire RIGHT knee area, from mid-thigh to mid-shin, lateral, anterior, and medial aspects. Prepping solution: ChloraPrep (2% chlorhexidine gluconate and 70% isopropyl alcohol) Safety Precautions: Aspiration looking for blood return was conducted prior to all injections. At no point did we inject any substances, as a needle was being advanced. No attempts were made at seeking any paresthesias. Safe injection  practices and needle disposal techniques used. Medications properly checked for expiration dates. SDV (single dose vial) medications used. Latex Allergy precautions taken.   Description of the  Procedure: Protocol guidelines were followed. The patient was placed in position over the procedure table. The target area was identified and the area prepped in the usual manner. Skin & deeper tissues infiltrated with local anesthetic. Appropriate amount of time allowed to pass for local anesthetics to take effect. The procedure needles were then advanced to the target area. Proper needle placement secured. Negative aspiration confirmed. Solution injected in intermittent fashion, asking for systemic symptoms every 0.5cc of injectate. The needles were then removed and the area cleansed, making sure to leave some of the prepping solution back to take advantage of its long term bactericidal properties.  Vitals:   01/10/18 1025 01/10/18 1035 01/10/18 1040 01/10/18 1050  BP: 137/88 118/86 132/86 124/84  Pulse: 76 74 76 72  Resp: 13 16 15 16   Temp:      SpO2: 99% 99% 97% 98%  Weight:      Height:        Start Time: 1035 hrs. End Time: 1044 hrs. Materials:  Needle(s) Type: Regular needle Gauge: 22G Length: 3.5-in Medication(s): Please see orders for medications and dosing details.  5 cc solution made of 4 cc of 0.2% ropivacaine, 1 cc of Decadron 10 mg/cc.  1.5 cc injected at each level for the right knee.  Total steroid dose equals 10 mg Imaging Guidance (Non-Spinal):          Type of Imaging Technique: Fluoroscopy Guidance (Non-Spinal) Indication(s): Assistance in needle guidance and placement for procedures requiring needle placement in or near specific anatomical locations not easily accessible without such assistance. Exposure Time: Please see nurses notes. Contrast: Before injecting any contrast, we confirmed that the patient did not have an allergy to iodine, shellfish, or radiological contrast.  Once satisfactory needle placement was completed at the desired level, radiological contrast was injected. Contrast injected under live fluoroscopy. No contrast complications. See chart for type and volume of contrast used. Fluoroscopic Guidance: I was personally present during the use of fluoroscopy. "Tunnel Vision Technique" used to obtain the best possible view of the target area. Parallax error corrected before commencing the procedure. "Direction-depth-direction" technique used to introduce the needle under continuous pulsed fluoroscopy. Once target was reached, antero-posterior, oblique, and lateral fluoroscopic projection used confirm needle placement in all planes. Images permanently stored in EMR. Interpretation: I personally interpreted the imaging intraoperatively. Adequate needle placement confirmed in multiple planes. Appropriate spread of contrast into desired area was observed. No evidence of afferent or efferent intravascular uptake. Permanent images saved into the patient's record.  Antibiotic Prophylaxis:   Anti-infectives (From admission, onward)   None     Indication(s): None identified  Post-operative Assessment:  Post-procedure Vital Signs:  Pulse/HCG Rate: 72  Temp: 98.8 F (37.1 C) Resp: 16 BP: 124/84 SpO2: 98 %  EBL: None  Complications: No immediate post-treatment complications observed by team, or reported by patient.  Note: The patient tolerated the entire procedure well. A repeat set of vitals were taken after the procedure and the patient was kept under observation following institutional policy, for this type of procedure. Post-procedural neurological assessment was performed, showing return to baseline, prior to discharge. The patient was provided with post-procedure discharge instructions, including a section on how to identify potential problems. Should any problems arise concerning this procedure, the patient was given instructions to immediately contact  us, at any time, without hesitation. In any case, we plan to contact the patient by telephone for a follow-up status report regarding this interventional procedure.  Comments:  No additional  relevant information. 5 out of 5 strength bilateral lower extremity: Plantar flexion, dorsiflexion, knee flexion, knee extension.  Plan of Care    Imaging Orders     DG C-Arm 1-60 Min-No Report Procedure Orders    No procedure(s) ordered today    Medications ordered for procedure: Meds ordered this encounter  Medications  . HYDROcodone-acetaminophen (NORCO) 7.5-325 MG tablet    Sig: Take 1 tablet by mouth 3 (three) times daily as needed for moderate pain.    Dispense:  90 tablet    Refill:  0    Do not place this medication, or any other prescription from our practice, on "Automatic Refill". Patient may have prescription filled one day early if pharmacy is closed on scheduled refill date. :  . HYDROcodone-acetaminophen ([place]) 7.5-325 MG tablet    Sig: Take 1 tablet by mouth 3 (three) times daily as needed for moderate pain.    Dispense:  90 tablet    Refill:  0    Do not place this medication, or any other prescription from our practice, on "Automatic Refill". Patient may have prescription filled one day early if pharmacy is closed on scheduled refill date.  . ropivacaine (PF) 2 mg/mL (0.2%) (NAROPIN) injection 10 mL  . lidocaine (XYLOCAINE) 2 % (with pres) injection 400 mg  . dexamethasone (DECADRON) injection 10 mg   Medications administered: We administered ropivacaine (PF) 2 mg/mL (0.2%), lidocaine, and dexamethasone.  See the medical record for exact dosing, route, and time of administration.  New Prescriptions   HYDROCODONE-ACETAMINOPHEN (NORCO) 7.5-325 MG TABLET    Take 1 tablet by mouth 3 (three) times daily as needed for moderate pain.   Disposition: Discharge home  Discharge Date & Time: 01/10/2018; 1051 hrs.   Physician-requested Follow-up: Return in about 6 weeks (around  02/21/2018) for Post Procedure Evaluation, Medication Management.  Future Appointments  Date Time Provider [place]  02/15/2018  8:30 AM Pamela Santa, MD [facility] None   Primary Care Physician: Pamela Freshwater, NP Location: [facility] Note by: Pamela Santa, MD Date: 01/10/2018; Time: 11:13 AM  Disclaimer:  Medicine is not an exact science. The only guarantee in medicine is that nothing is guaranteed. It is important to note that the decision to proceed with this intervention was based on the information collected from the patient. The Data and conclusions were drawn from the patient's questionnaire, the interview, and the physical examination. Because the information was provided in large part by the patient, it cannot be guaranteed that it has not been purposely or unconsciously manipulated. Every effort has been made to obtain as much relevant data as possible for this evaluation. It is important to note that the conclusions that lead to this procedure are derived in large part from the available data. Always take into account that the treatment will also be dependent on availability of resources and existing treatment guidelines, considered by other Pain Management Practitioners as being common knowledge and practice, at the time of the intervention. For Medico-Legal purposes, it is also important to point out that variation in procedural techniques and pharmacological choices are the acceptable norm. The indications, contraindications, technique, and results of the above procedure should only be interpreted and judged by a Board-Certified Interventional Pain Specialist with extensive familiarity and expertise in the same exact procedure and technique.

## 2018-01-10 NOTE — Progress Notes (Signed)
Nursing Pain Medication Assessment:  Safety precautions to be maintained throughout the outpatient stay will include: orient to surroundings, keep bed in low position, maintain call bell within reach at all times, provide assistance with transfer out of bed and ambulation.  Medication Inspection Compliance: Pill count conducted under aseptic conditions, in front of the patient. Neither the pills nor the bottle was removed from the patient's sight at any time. Once count was completed pills were immediately returned to the patient in their original bottle.  Medication: Hydrocodone/APAP Pill/Patch Count: 15 of 90 pills remain Pill/Patch Appearance: Markings consistent with prescribed medication Bottle Appearance: Standard pharmacy container. Clearly labeled. Filled Date: 09 / 11 / 2019 Last Medication intake:  Today

## 2018-01-11 ENCOUNTER — Telehealth: Payer: Self-pay | Admitting: *Deleted

## 2018-01-11 NOTE — Telephone Encounter (Signed)
Attempted to call for post procedure follow-up. No answer, unable to leave a message. 

## 2018-01-16 ENCOUNTER — Encounter: Payer: BLUE CROSS/BLUE SHIELD | Admitting: Student in an Organized Health Care Education/Training Program

## 2018-02-15 ENCOUNTER — Ambulatory Visit
Payer: BLUE CROSS/BLUE SHIELD | Attending: Student in an Organized Health Care Education/Training Program | Admitting: Student in an Organized Health Care Education/Training Program

## 2018-02-15 ENCOUNTER — Other Ambulatory Visit: Payer: Self-pay

## 2018-02-15 VITALS — BP 148/101 | HR 93 | Temp 98.1°F | Resp 16 | Ht 68.0 in | Wt 205.0 lb

## 2018-02-15 DIAGNOSIS — E785 Hyperlipidemia, unspecified: Secondary | ICD-10-CM | POA: Diagnosis not present

## 2018-02-15 DIAGNOSIS — L03116 Cellulitis of left lower limb: Secondary | ICD-10-CM | POA: Diagnosis not present

## 2018-02-15 DIAGNOSIS — M79604 Pain in right leg: Secondary | ICD-10-CM | POA: Insufficient documentation

## 2018-02-15 DIAGNOSIS — I2510 Atherosclerotic heart disease of native coronary artery without angina pectoris: Secondary | ICD-10-CM | POA: Insufficient documentation

## 2018-02-15 DIAGNOSIS — Z6831 Body mass index (BMI) 31.0-31.9, adult: Secondary | ICD-10-CM | POA: Diagnosis not present

## 2018-02-15 DIAGNOSIS — Z8249 Family history of ischemic heart disease and other diseases of the circulatory system: Secondary | ICD-10-CM | POA: Insufficient documentation

## 2018-02-15 DIAGNOSIS — E669 Obesity, unspecified: Secondary | ICD-10-CM | POA: Diagnosis not present

## 2018-02-15 DIAGNOSIS — Z8542 Personal history of malignant neoplasm of other parts of uterus: Secondary | ICD-10-CM | POA: Diagnosis not present

## 2018-02-15 DIAGNOSIS — R202 Paresthesia of skin: Secondary | ICD-10-CM | POA: Insufficient documentation

## 2018-02-15 DIAGNOSIS — N189 Chronic kidney disease, unspecified: Secondary | ICD-10-CM | POA: Insufficient documentation

## 2018-02-15 DIAGNOSIS — M064 Inflammatory polyarthropathy: Secondary | ICD-10-CM | POA: Insufficient documentation

## 2018-02-15 DIAGNOSIS — M170 Bilateral primary osteoarthritis of knee: Secondary | ICD-10-CM | POA: Diagnosis not present

## 2018-02-15 DIAGNOSIS — I129 Hypertensive chronic kidney disease with stage 1 through stage 4 chronic kidney disease, or unspecified chronic kidney disease: Secondary | ICD-10-CM | POA: Insufficient documentation

## 2018-02-15 DIAGNOSIS — M1712 Unilateral primary osteoarthritis, left knee: Secondary | ICD-10-CM | POA: Diagnosis not present

## 2018-02-15 DIAGNOSIS — K219 Gastro-esophageal reflux disease without esophagitis: Secondary | ICD-10-CM | POA: Insufficient documentation

## 2018-02-15 DIAGNOSIS — Z79890 Hormone replacement therapy (postmenopausal): Secondary | ICD-10-CM | POA: Insufficient documentation

## 2018-02-15 DIAGNOSIS — E039 Hypothyroidism, unspecified: Secondary | ICD-10-CM | POA: Insufficient documentation

## 2018-02-15 DIAGNOSIS — B9689 Other specified bacterial agents as the cause of diseases classified elsewhere: Secondary | ICD-10-CM | POA: Diagnosis not present

## 2018-02-15 DIAGNOSIS — F330 Major depressive disorder, recurrent, mild: Secondary | ICD-10-CM | POA: Diagnosis not present

## 2018-02-15 DIAGNOSIS — M25571 Pain in right ankle and joints of right foot: Secondary | ICD-10-CM | POA: Insufficient documentation

## 2018-02-15 DIAGNOSIS — M2141 Flat foot [pes planus] (acquired), right foot: Secondary | ICD-10-CM | POA: Diagnosis not present

## 2018-02-15 DIAGNOSIS — F17210 Nicotine dependence, cigarettes, uncomplicated: Secondary | ICD-10-CM | POA: Insufficient documentation

## 2018-02-15 DIAGNOSIS — F419 Anxiety disorder, unspecified: Secondary | ICD-10-CM | POA: Diagnosis not present

## 2018-02-15 DIAGNOSIS — C541 Malignant neoplasm of endometrium: Secondary | ICD-10-CM | POA: Diagnosis not present

## 2018-02-15 DIAGNOSIS — Z79899 Other long term (current) drug therapy: Secondary | ICD-10-CM | POA: Insufficient documentation

## 2018-02-15 DIAGNOSIS — G894 Chronic pain syndrome: Secondary | ICD-10-CM | POA: Diagnosis not present

## 2018-02-15 NOTE — Progress Notes (Signed)
Patient's Name: Pamela Romero  MRN: [id number]  Referring Provider: Ronnell Freshwater, NP  DOB: March 05, 1951  PCP: Ronnell Freshwater, NP  DOS: 02/15/2018  Note by: Gillis Santa, MD  Service setting: Ambulatory outpatient  Specialty: Interventional Pain Management  Location: [facility] (AMB) Pain Management Facility    Patient type: Established   Primary Reason(s) for Visit: Encounter for post-procedure evaluation of chronic illness with mild to moderate exacerbation CC: Ankle Pain (right) and Leg Pain (left and right)  HPI  Pamela Romero is a 67 y.o. year old, female patient, who comes today for a post-procedure evaluation. 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; Hyperlipidemia LDL goal <100; Transient vision disturbance, bilateral; Unspecified constipation; Cough; Dysphagia; Obesity, unspecified; Annual physical exam; S/P breast biopsy; Pes planus of both feet; Shortness of breath; Cellulitis of left lower limb; Edema, unspecified; Inflammatory polyarthropathy ([place]); Major depressive disorder, recurrent, mild ([place]); Chronic kidney disease, unspecified; Anxiety disorder, unspecified; Nicotine dependence, cigarettes, uncomplicated; Chronic pain of left knee; Plantar fascia syndrome; Acute left-sided low back pain without sciatica; Bilateral primary osteoarthritis of knee; Chronic pain of right ankle; and Primary osteoarthritis of right ankle on their problem list. Her primarily concern today is the Ankle Pain (right) and Leg Pain (left and right)  Pain Assessment: Location: (P) Left (P) Leg Radiating: (P) denies Onset: (P) More than a month ago Duration: Chronic pain Quality: (P) Tingling(drawing) Severity: 7 /10 (subjective, self-reported pain score)  Note: Reported level is inconsistent with clinical observations. Clinically the patient looks like a 3/10 A  3/10 is viewed as "Moderate" and described as significantly interfering with activities of daily living (ADL). It becomes difficult to feed, bathe, get dressed, get on and off the toilet or to perform personal hygiene functions. Difficult to get in and out of bed or a chair without assistance. Very distracting. With effort, it can be ignored when deeply involved in activities. Information on the proper use of the pain scale provided to the patient today. When using our objective Pain Scale, levels between 6 and 10/10 are said to belong in an emergency room, as it progressively worsens from a 6/10, described as severely limiting, requiring emergency care not usually available at an outpatient pain management facility. At a 6/10 level, communication becomes difficult and requires great effort. Assistance to reach the emergency department may be required. Facial flushing and profuse sweating along with potentially dangerous increases in heart rate and blood pressure will be evident. Effect on ADL:   Timing: (P) Intermittent Modifying factors: (P) elevation, Epsom salt BP: (!) 148/101  HR: 93  Pamela Romero comes in today for post-procedure evaluation.  Further details on both, my assessment(s), as well as the proposed treatment plan, please see below.  Post-Procedure Assessment  01/10/2018 Procedure: Right knee genicular nerve block #2 Pre-procedure pain score:  10/10 Post-procedure pain score: 0/10         Influential Factors: BMI: 31.17 kg/m Intra-procedural challenges: None observed.         Assessment challenges: None detected.              Reported side-effects: None.        Post-procedural adverse reactions or complications: None reported         Sedation: Please see nurses note. When no sedatives are used, the analgesic levels obtained are directly associated to the effectiveness of the local anesthetics. However, when sedation is provided, the level  of analgesia obtained during the initial  1 hour following the intervention, is believed to be the result of a combination of factors. These factors may include, but are not limited to: 1. The effectiveness of the local anesthetics used. 2. The effects of the analgesic(s) and/or anxiolytic(s) used. 3. The degree of discomfort experienced by the patient at the time of the procedure. 4. The patients ability and reliability in recalling and recording the events. 5. The presence and influence of possible secondary gains and/or psychosocial factors. Reported result: Relief experienced during the 1st hour after the procedure: 100 % (Ultra-Short Term Relief)            Interpretative annotation: Clinically appropriate result. Analgesia during this period is likely to be Local Anesthetic and/or IV Sedative (Analgesic/Anxiolytic) related.          Effects of local anesthetic: The analgesic effects attained during this period are directly associated to the localized infiltration of local anesthetics and therefore cary significant diagnostic value as to the etiological location, or anatomical origin, of the pain. Expected duration of relief is directly dependent on the pharmacodynamics of the local anesthetic used. Long-acting (4-6 hours) anesthetics used.  Reported result: Relief during the next 4 to 6 hour after the procedure: 100 % (Short-Term Relief)            Interpretative annotation: Clinically appropriate result. Analgesia during this period is likely to be Local Anesthetic-related.          Long-term benefit: Defined as the period of time past the expected duration of local anesthetics (1 hour for short-acting and 4-6 hours for long-acting). With the possible exception of prolonged sympathetic blockade from the local anesthetics, benefits during this period are typically attributed to, or associated with, other factors such as analgesic sensory neuropraxia, antiinflammatory effects, or beneficial biochemical changes provided by agents other than  the local anesthetics.  Reported result: Extended relief following procedure: 90 %(90% L. leg, 0 rellief in R. leg) (Long-Term Relief)            Interpretative annotation: Clinically possible results. Good relief. No permanent benefit expected. Inflammation plays a part in the etiology to the pain.          Current benefits: Defined as reported results that persistent at this point in time.   Analgesia: 25-50 %            Interpretative annotation: Recurrence of symptoms.  Specifically in the right knee No permanent benefit expected. Effective diagnostic intervention.          Interpretation: Results would suggest a successful diagnostic intervention.                  Plan:  Proceed with Radiofrequency Ablation for the purpose of attaining long-term benefits.                Laboratory Chemistry  Inflammation Markers (CRP: Acute Phase) (ESR: Chronic Phase) Lab Results  Component Value Date   ESRSEDRATE 19 03/30/2012                         Rheumatology Markers No results found for: RF, ANA, LABURIC, URICUR, LYMEIGGIGMAB, LYMEABIGMQN, HLAB27                      Renal Function Markers Lab Results  Component Value Date   BUN 19 08/29/2017   CREATININE 1.13 (H) 08/29/2017   BCR 17 08/29/2017   GFRAA  59 (L) 08/29/2017   GFRNONAA 51 (L) 08/29/2017                             Hepatic Function Markers Lab Results  Component Value Date   AST 15 08/29/2017   ALT 18 08/29/2017   ALBUMIN 4.4 08/29/2017   ALKPHOS 126 (H) 08/29/2017   HCVAB NEGATIVE 01/21/2015                        Electrolytes Lab Results  Component Value Date   NA 141 08/29/2017   K 3.8 08/29/2017   CL 102 08/29/2017   CALCIUM 9.3 08/29/2017   MG 1.7 08/29/2017                        Neuropathy Markers Lab Results  Component Value Date   VITAMINB12 224 (L) 08/29/2017   HGBA1C 4.8 01/21/2015   HIV NONREACTIVE 01/21/2015                        CNS Tests No results found for: COLORCSF, APPEARCSF,  RBCCOUNTCSF, WBCCSF, POLYSCSF, LYMPHSCSF, EOSCSF, PROTEINCSF, GLUCCSF, JCVIRUS, CSFOLI, IGGCSF                      Bone Pathology Markers Lab Results  Component Value Date   VD25OH 26.66 (L) 05/14/2014                         Coagulation Parameters Lab Results  Component Value Date   PLT 127.0 (L) 09/16/2013                        Cardiovascular Markers Lab Results  Component Value Date   HGB 12.1 09/16/2013   HCT 36.3 09/16/2013                         CA Markers No results found for: CEA, CA125, LABCA2                      Note: Lab results reviewed.  Recent Diagnostic Imaging Results  DG C-Arm 1-60 Min-No Report Fluoroscopy was utilized by the requesting physician.  No radiographic  interpretation.   Complexity Note: Imaging results reviewed. Results shared with Ms. Alverio, using Layman's terms.                         Meds   Current Outpatient Medications:  .  docusate sodium (COLACE) 100 MG capsule, Take 100 mg by mouth 2 (two) times daily as needed., Disp: , Rfl:  .  DULoxetine (CYMBALTA) 20 MG capsule, Take 20 mg by mouth daily., Disp: , Rfl:  .  esomeprazole (NEXIUM) 40 MG capsule, Take 1 capsule (40 mg total) by mouth daily., Disp: 30 capsule, Rfl: 3 .  HYDROcodone-acetaminophen (NORCO) 7.5-325 MG tablet, Take 1 tablet by mouth 3 (three) times daily as needed for moderate pain., Disp: 90 tablet, Rfl: 0 .  levothyroxine (SYNTHROID, LEVOTHROID) 150 MCG tablet, Take 2 tablets (300 mcg total) by mouth daily., Disp: 60 tablet, Rfl: 3 .  lisinopril (PRINIVIL,ZESTRIL) 10 MG tablet, Take 1 tablet (10 mg total) by mouth daily., Disp: 30 tablet, Rfl: 3 .  rosuvastatin (CRESTOR) 5 MG tablet, Take 5 mg by mouth  at bedtime., Disp: , Rfl:  .  ranitidine (ZANTAC) 150 MG capsule, Take by mouth., Disp: , Rfl:  .  traMADol (ULTRAM) 50 MG tablet, Take 2 tablets (100 mg total) by mouth every 6 (six) hours as needed. (Patient not taking: Reported on 02/15/2018), Disp: 120 tablet,  Rfl: 1  ROS  Constitutional: Denies any fever or chills Gastrointestinal: No reported hemesis, hematochezia, vomiting, or acute GI distress Musculoskeletal: Denies any acute onset joint swelling, redness, loss of ROM, or weakness Neurological: No reported episodes of acute onset apraxia, aphasia, dysarthria, agnosia, amnesia, paralysis, loss of coordination, or loss of consciousness  Allergies  Ms. Langenbach has No Known Allergies.  [place]  Drug: Ms. Whipple  reports that she does not use drugs. Alcohol:  reports that she does not drink alcohol. Tobacco:  reports that she has been smoking. She has never used smokeless tobacco. Medical:  has a past medical history of Abnormal EKG, CAD (coronary artery disease), Cancer ([place]), Endometrial adenocarcinoma ([place]) (2011), Hyperlipidemia, Hypertension, Osteoarthritis, Reflux gastritis, Thyroid disease, tobacco abuse, and Vaginal delivery. Surgical: Ms. Marschner  has a past surgical history that includes Thyroidectomy (2005); Cardiac catheterization (2006); Total abdominal hysterectomy; Breast surgery (Right, March 2014); and Breast cyst aspiration (Right). Family: family history includes Alcohol abuse in her father, maternal aunt, and mother; Cancer in her brother, brother, and maternal aunt; Heart disease in her brother.  Constitutional Exam  General appearance: Well nourished, well developed, and well hydrated. In no apparent acute distress Vitals:   02/15/18 0837  BP: (!) 148/101  Pulse: 93  Resp: 16  Temp: 98.1 F (36.7 C)  TempSrc: Oral  SpO2: 99%  Weight: 205 lb (93 kg)  Height: '5\' 8"'$  (1.727 m)   BMI Assessment: Estimated body mass index is 31.17 kg/m as calculated from the following:   Height as of this encounter: '5\' 8"'$  (1.727 m).   Weight as of this encounter: 205 lb (93 kg).  BMI interpretation table: BMI level Category Range association with higher incidence of chronic pain  <18 kg/m2 Underweight   18.5-24.9 kg/m2 Ideal body  weight   25-29.9 kg/m2 Overweight Increased incidence by 20%  30-34.9 kg/m2 Obese (Class I) Increased incidence by 68%  35-39.9 kg/m2 Severe obesity (Class II) Increased incidence by 136%  >40 kg/m2 Extreme obesity (Class III) Increased incidence by 254%   Patient's current BMI Ideal Body weight  Body mass index is 31.17 kg/m. Ideal body weight: 63.9 kg (140 lb 14 oz) Adjusted ideal body weight: 75.5 kg (166 lb 8.4 oz)   BMI Readings from Last 4 Encounters:  02/15/18 31.17 kg/m  01/10/18 31.17 kg/m  12/27/17 30.41 kg/m  11/29/17 33.45 kg/m   Wt Readings from Last 4 Encounters:  02/15/18 205 lb (93 kg)  01/10/18 205 lb (93 kg)  12/27/17 200 lb (90.7 kg)  11/29/17 220 lb (99.8 kg)  Psych/Mental status: Alert, oriented x 3 (person, place, & time)       Eyes: PERLA Respiratory: No evidence of acute respiratory distress  Cervical Spine Area Exam  Skin & Axial Inspection: No masses, redness, edema, swelling, or associated skin lesions Alignment: Symmetrical Functional ROM: Unrestricted ROM      Stability: No instability detected Muscle Tone/Strength: Functionally intact. No obvious neuro-muscular anomalies detected. Sensory (Neurological): Unimpaired Palpation: No palpable anomalies              Upper Extremity (UE) Exam    Side: Right upper extremity  Side: Left upper extremity  Skin & Extremity  Inspection: Skin color, temperature, and hair growth are WNL. No peripheral edema or cyanosis. No masses, redness, swelling, asymmetry, or associated skin lesions. No contractures.  Skin & Extremity Inspection: Skin color, temperature, and hair growth are WNL. No peripheral edema or cyanosis. No masses, redness, swelling, asymmetry, or associated skin lesions. No contractures.  Functional ROM: Unrestricted ROM          Functional ROM: Unrestricted ROM          Muscle Tone/Strength: Functionally intact. No obvious neuro-muscular anomalies detected.  Muscle Tone/Strength: Functionally  intact. No obvious neuro-muscular anomalies detected.  Sensory (Neurological): Unimpaired          Sensory (Neurological): Unimpaired          Palpation: No palpable anomalies              Palpation: No palpable anomalies              Provocative Test(s):  Phalen's test: deferred Tinel's test: deferred Apley's scratch test (touch opposite shoulder):  Action 1 (Across chest): deferred Action 2 (Overhead): deferred Action 3 (LB reach): deferred   Provocative Test(s):  Phalen's test: deferred Tinel's test: deferred Apley's scratch test (touch opposite shoulder):  Action 1 (Across chest): deferred Action 2 (Overhead): deferred Action 3 (LB reach): deferred    Thoracic Spine Area Exam  Skin & Axial Inspection: No masses, redness, or swelling Alignment: Symmetrical Functional ROM: Unrestricted ROM Stability: No instability detected Muscle Tone/Strength: Functionally intact. No obvious neuro-muscular anomalies detected. Sensory (Neurological): Unimpaired Muscle strength & Tone: No palpable anomalies  Lumbar Spine Area Exam  Skin & Axial Inspection: No masses, redness, or swelling Alignment: Symmetrical Functional ROM: Unrestricted ROM       Stability: No instability detected Muscle Tone/Strength: Functionally intact. No obvious neuro-muscular anomalies detected. Sensory (Neurological): Unimpaired Palpation: No palpable anomalies       Provocative Tests: Hyperextension/rotation test: deferred today       Lumbar quadrant test (Kemp's test): deferred today       Lateral bending test: deferred today       Patrick's Maneuver: deferred today                   FABER test: deferred today                   S-I anterior distraction/compression test: deferred today         S-I lateral compression test: deferred today         S-I Thigh-thrust test: deferred today         S-I Gaenslen's test: deferred today          Gait & Posture Assessment  Ambulation: Unassisted Gait: Relatively  normal for age and body habitus Posture: WNL   Lower Extremity Exam    Side: Right lower extremity  Side: Left lower extremity  Stability: No instability observed          Stability: No instability observed          Skin & Extremity Inspection: Skin color, temperature, and hair growth are WNL. No peripheral edema or cyanosis. No masses, redness, swelling, asymmetry, or associated skin lesions. No contractures.  Skin & Extremity Inspection: Skin color, temperature, and hair growth are WNL. No peripheral edema or cyanosis. No masses, redness, swelling, asymmetry, or associated skin lesions. No contractures.  Functional ROM: Decreased ROM for hip and knee joints          Functional ROM: Decreased ROM for hip  and knee joints          Muscle Tone/Strength: Functionally intact. No obvious neuro-muscular anomalies detected.  Muscle Tone/Strength: Functionally intact. No obvious neuro-muscular anomalies detected.  Sensory (Neurological): Unimpaired  Sensory (Neurological): Unimpaired  Palpation: No palpable anomalies  Palpation: No palpable anomalies   Assessment  Primary Diagnosis & Pertinent Problem List: The primary encounter diagnosis was Bilateral primary osteoarthritis of knee. Diagnoses of Chronic pain syndrome and Primary osteoarthritis of left knee were also pertinent to this visit.  Status Diagnosis  Responding Persistent Responding 1. Bilateral primary osteoarthritis of knee   2. Chronic pain syndrome   3. Primary osteoarthritis of left knee      Patient follows up today after her right knee genicular nerve block #2.  Patient's procedure was on 01/10/2018.  Patient endorses approximately 2 weeks of significant pain relief in her right knee after the block.  Patient states that has she not to receive the block, her pain would have been much higher since she is having to take care of her brother during his illness and she has been on her feet for a longer period of time.  The patient  continues to work an overnight shift and is looking forward to retirement in 8 months.  She has return of knee pain.  We have completed 2 diagnostic right knee genicular nerve blocks which provided greater than 50% pain relief and improvement in functionality for a variable time of 2 to 3 weeks.  We discussed radio frequency ablation of these nerves.  Plan for right knee genicular nerve RFA with sedation.  We will plan for medication refill at next visit.  Plan: -Right knee genicular RFA -Medication management at next visit -Continue chronic pain medications as above.  Future considerations: -Left knee genicular RFA   Lab-work, procedure(s), and/or referral(s): Orders Placed This Encounter  Procedures  . Radiofrequency,Genicular   Time Note: Greater than 50% of the 25 minute(s) of face-to-face time spent with Ms. Freund, was spent in counseling/coordination of care regarding: Ms. Jenison primary cause of pain, the treatment plan, treatment alternatives, the risks and possible complications of proposed treatment, going over the informed consent, the results, interpretation and significance of  her recent diagnostic interventional treatment(s) and realistic expectations.  Provider-requested follow-up: Return in about 18 days (around 03/05/2018) for Procedure.  No future appointments.  Primary Care Physician: Ronnell Freshwater, NP Location: [facility] Note by: Gillis Santa, M.D Date: 02/15/2018; Time: 9:43 AM  Patient Instructions  ____________________________________________________________________________________________  Preparing for Procedure with Sedation  Instructions: . Oral Intake: Do not eat or drink anything for at least 8 hours prior to your procedure. . Transportation: Public transportation is not allowed. Bring an adult driver. The driver must be physically present in our waiting room before any procedure can be started. [name] Physical  Assistance: Bring an adult physically capable of assisting you, in the event you need help. This adult should keep you company at home for at least 6 hours after the procedure. . Blood Pressure Medicine: Take your blood pressure medicine with a sip of water the morning of the procedure. . Blood thinners: Notify our staff if you are taking any blood thinners. Depending on which one you take, there will be specific instructions on how and when to stop it. . Diabetics on insulin: Notify the staff so that you can be scheduled 1st case in the morning. If your diabetes requires high dose insulin, take only  of your normal insulin  dose the morning of the procedure and notify the staff that you have done so. . Preventing infections: Shower with an antibacterial soap the morning of your procedure. . Build-up your immune system: Take 1000 mg of Vitamin C with every meal (3 times a day) the day prior to your procedure. [name] Antibiotics: Inform the staff if you have a condition or reason that requires you to take antibiotics before dental procedures. . Pregnancy: If you are pregnant, call and cancel the procedure. . Sickness: If you have a cold, fever, or any active infections, call and cancel the procedure. . Arrival: You must be in the facility at least 30 minutes prior to your scheduled procedure. . Children: Do not bring children with you. . Dress appropriately: Bring dark clothing that you would not mind if they get stained. . Valuables: Do not bring any jewelry or valuables.  Procedure appointments are reserved for interventional treatments only. [name] No Prescription Refills. . No medication changes will be discussed during procedure appointments. . No disability issues will be discussed.  Reasons to call and reschedule or cancel your procedure: (Following these recommendations will minimize the risk of a serious complication.) . Surgeries: Avoid having procedures within 2 weeks of any surgery. (Avoid for 2  weeks before or after any surgery). . Flu Shots: Avoid having procedures within 2 weeks of a flu shots or . (Avoid for 2 weeks before or after immunizations). . Barium: Avoid having a procedure within 7-10 days after having had a radiological study involving the use of radiological contrast. (Myelograms, Barium swallow or enema study). . Heart attacks: Avoid any elective procedures or surgeries for the initial 6 months after a "Myocardial Infarction" (Heart Attack). . Blood thinners: It is imperative that you stop these medications before procedures. Let us know if you if you take any blood thinner.  . Infection: Avoid procedures during or within two weeks of an infection (including chest colds or gastrointestinal problems). Symptoms associated with infections include: Localized redness, fever, chills, night sweats or profuse sweating, burning sensation when voiding, cough, congestion, stuffiness, runny nose, sore throat, diarrhea, nausea, vomiting, cold or Flu symptoms, recent or current infections. It is specially important if the infection is over the area that we intend to treat. [name] Heart and lung problems: Symptoms that may suggest an active cardiopulmonary problem include: cough, chest pain, breathing difficulties or shortness of breath, dizziness, ankle swelling, uncontrolled high or unusually low blood pressure, and/or palpitations. If you are experiencing any of these symptoms, cancel your procedure and contact your primary care physician for an evaluation.  Remember:  Regular Business hours are:  Monday to Thursday 8:00 AM to 4:00 PM  Provider's Schedule: Milinda Pointer, MD:  Procedure days: Tuesday and Thursday 7:30 AM to 4:00 PM  Gillis Santa, MD:  Procedure days: Monday and Wednesday 7:30 AM to 4:00 PM ____________________________________________________________________________________________

## 2018-02-15 NOTE — Progress Notes (Signed)
Nursing Pain Medication Assessment:  Safety precautions to be maintained throughout the outpatient stay will include: orient to surroundings, keep bed in low position, maintain call bell within reach at all times, provide assistance with transfer out of bed and ambulation.  Medication Inspection Compliance: Pill count conducted under aseptic conditions, in front of the patient. Neither the pills nor the bottle was removed from the patient's sight at any time. Once count was completed pills were immediately returned to the patient in their original bottle.  Medication: Hydrocodone/APAP Pill/Patch Count: 87 of 90 pills remain Pill/Patch Appearance: Markings consistent with prescribed medication Bottle Appearance: Standard pharmacy container. Clearly labeled. Filled Date: 11/06 / 2019 Last Medication intake:  Today

## 2018-02-15 NOTE — Patient Instructions (Signed)
____________________________________________________________________________________________  Preparing for Procedure with Sedation  Instructions: . Oral Intake: Do not eat or drink anything for at least 8 hours prior to your procedure. . Transportation: Public transportation is not allowed. Bring an adult driver. The driver must be physically present in our waiting room before any procedure can be started. . Physical Assistance: Bring an adult physically capable of assisting you, in the event you need help. This adult should keep you company at home for at least 6 hours after the procedure. . Blood Pressure Medicine: Take your blood pressure medicine with a sip of water the morning of the procedure. . Blood thinners: Notify our staff if you are taking any blood thinners. Depending on which one you take, there will be specific instructions on how and when to stop it. . Diabetics on insulin: Notify the staff so that you can be scheduled 1st case in the morning. If your diabetes requires high dose insulin, take only  of your normal insulin dose the morning of the procedure and notify the staff that you have done so. . Preventing infections: Shower with an antibacterial soap the morning of your procedure. . Build-up your immune system: Take 1000 mg of Vitamin C with every meal (3 times a day) the day prior to your procedure. . Antibiotics: Inform the staff if you have a condition or reason that requires you to take antibiotics before dental procedures. . Pregnancy: If you are pregnant, call and cancel the procedure. . Sickness: If you have a cold, fever, or any active infections, call and cancel the procedure. . Arrival: You must be in the facility at least 30 minutes prior to your scheduled procedure. . Children: Do not bring children with you. . Dress appropriately: Bring dark clothing that you would not mind if they get stained. . Valuables: Do not bring any jewelry or valuables.  Procedure  appointments are reserved for interventional treatments only. . No Prescription Refills. . No medication changes will be discussed during procedure appointments. . No disability issues will be discussed.  Reasons to call and reschedule or cancel your procedure: (Following these recommendations will minimize the risk of a serious complication.) . Surgeries: Avoid having procedures within 2 weeks of any surgery. (Avoid for 2 weeks before or after any surgery). . Flu Shots: Avoid having procedures within 2 weeks of a flu shots or . (Avoid for 2 weeks before or after immunizations). . Barium: Avoid having a procedure within 7-10 days after having had a radiological study involving the use of radiological contrast. (Myelograms, Barium swallow or enema study). . Heart attacks: Avoid any elective procedures or surgeries for the initial 6 months after a "Myocardial Infarction" (Heart Attack). . Blood thinners: It is imperative that you stop these medications before procedures. Let us know if you if you take any blood thinner.  . Infection: Avoid procedures during or within two weeks of an infection (including chest colds or gastrointestinal problems). Symptoms associated with infections include: Localized redness, fever, chills, night sweats or profuse sweating, burning sensation when voiding, cough, congestion, stuffiness, runny nose, sore throat, diarrhea, nausea, vomiting, cold or Flu symptoms, recent or current infections. It is specially important if the infection is over the area that we intend to treat. . Heart and lung problems: Symptoms that may suggest an active cardiopulmonary problem include: cough, chest pain, breathing difficulties or shortness of breath, dizziness, ankle swelling, uncontrolled high or unusually low blood pressure, and/or palpitations. If you are experiencing any of these symptoms, cancel   your procedure and contact your primary care physician for an evaluation.  Remember:   Regular Business hours are:  Monday to Thursday 8:00 AM to 4:00 PM  Provider's Schedule: Francisco Naveira, MD:  Procedure days: Tuesday and Thursday 7:30 AM to 4:00 PM  Bilal Lateef, MD:  Procedure days: Monday and Wednesday 7:30 AM to 4:00 PM ____________________________________________________________________________________________    

## 2018-02-28 ENCOUNTER — Telehealth: Payer: Self-pay

## 2018-03-06 NOTE — Telephone Encounter (Signed)
error 

## 2018-03-12 ENCOUNTER — Encounter: Payer: Self-pay | Admitting: Student in an Organized Health Care Education/Training Program

## 2018-03-12 ENCOUNTER — Ambulatory Visit (HOSPITAL_BASED_OUTPATIENT_CLINIC_OR_DEPARTMENT_OTHER): Payer: BLUE CROSS/BLUE SHIELD | Admitting: Student in an Organized Health Care Education/Training Program

## 2018-03-12 ENCOUNTER — Ambulatory Visit
Admission: RE | Admit: 2018-03-12 | Discharge: 2018-03-12 | Disposition: A | Discharge status: Home / Self Care | Payer: BLUE CROSS/BLUE SHIELD | Source: Ambulatory Visit | Attending: Student in an Organized Health Care Education/Training Program | Admitting: Student in an Organized Health Care Education/Training Program

## 2018-03-12 DIAGNOSIS — M170 Bilateral primary osteoarthritis of knee: Secondary | ICD-10-CM | POA: Diagnosis not present

## 2018-03-12 DIAGNOSIS — M79604 Pain in right leg: Secondary | ICD-10-CM | POA: Diagnosis present

## 2018-03-12 MED ORDER — DEXAMETHASONE SODIUM PHOSPHATE 10 MG/ML IJ SOLN
INTRAMUSCULAR | Status: AC
  Filled 2018-03-12: qty 1

## 2018-03-12 MED ORDER — HYDROCODONE-ACETAMINOPHEN 7.5-325 MG PO TABS: 1.0000 | ORAL_TABLET | Freq: Three times a day (TID) | ORAL | 0 refills | Status: DC | PRN

## 2018-03-12 MED ORDER — HYDROCODONE-ACETAMINOPHEN 7.5-325 MG PO TABS: 1.0000 | ORAL_TABLET | Freq: Three times a day (TID) | ORAL | 0 refills | Status: AC | PRN

## 2018-03-12 MED ORDER — FENTANYL CITRATE (PF) 100 MCG/2ML IJ SOLN
INTRAMUSCULAR | Status: AC
  Filled 2018-03-12: qty 2

## 2018-03-12 MED ORDER — LACTATED RINGERS IV SOLN
1000.0000 mL | Freq: Once | INTRAVENOUS | Status: AC
  Administered 2018-03-12: 1000 mL via INTRAVENOUS

## 2018-03-12 MED ORDER — ROPIVACAINE HCL 2 MG/ML IJ SOLN
INTRAMUSCULAR | Status: AC
  Filled 2018-03-12: qty 10

## 2018-03-12 MED ORDER — ROPIVACAINE HCL 2 MG/ML IJ SOLN
10.0000 mL | Freq: Once | INTRAMUSCULAR | Status: AC
  Administered 2018-03-12: 10 mL

## 2018-03-12 MED ORDER — LIDOCAINE HCL 2 % IJ SOLN
20.0000 mL | Freq: Once | INTRAMUSCULAR | Status: AC
  Administered 2018-03-12: 400 mg

## 2018-03-12 MED ORDER — LIDOCAINE HCL 2 % IJ SOLN
INTRAMUSCULAR | Status: AC
  Filled 2018-03-12: qty 20

## 2018-03-12 MED ORDER — FENTANYL CITRATE (PF) 100 MCG/2ML IJ SOLN
25.0000 ug | INTRAMUSCULAR | Status: DC | PRN
  Administered 2018-03-12: 75 ug via INTRAVENOUS

## 2018-03-12 MED ORDER — DEXAMETHASONE SODIUM PHOSPHATE 10 MG/ML IJ SOLN
10.0000 mg | Freq: Once | INTRAMUSCULAR | Status: AC
  Administered 2018-03-12: 10 mg

## 2018-03-12 NOTE — Progress Notes (Signed)
Safety precautions to be maintained throughout the outpatient stay will include: orient to surroundings, keep bed in low position, maintain call bell within reach at all times, provide assistance with transfer out of bed and ambulation.  

## 2018-03-12 NOTE — Patient Instructions (Addendum)
Two scripts for Hydrocodone were sent to your pharmacy.Post-procedure Information What to expect: Most procedures involve the use of a local anesthetic (numbing medicine), and a steroid (anti-inflammatory medicine).  The local anesthetics may cause temporary numbness and weakness of the legs or arms, depending on the location of the block. This numbness/weakness may last 4-6 hours, depending on the local anesthetic used. In rare instances, it can last up to 24 hours. While numb, you must be very careful not to injure the extremity.  After any procedure, you could expect the pain to get better within 15-20 minutes. This relief is temporary and may last 4-6 hours. Once the local anesthetics wears off, you could experience discomfort, possibly more than usual, for up to 10 (ten) days. In the case of radiofrequencies, it may last up to 6 weeks. Surgeries may take up to 8 weeks for the healing process. The discomfort is due to the irritation caused by needles going through skin and muscle. To minimize the discomfort, we recommend using ice the first day, and heat from then on. The ice should be applied for 15 minutes on, and 15 minutes off. Keep repeating this cycle until bedtime. Avoid applying the ice directly to the skin, to prevent frostbite. Heat should be used daily, until the pain improves (4-10 days). Be careful not to burn yourself.  Occasionally you may experience muscle spasms or cramps. These occur as a consequence of the irritation caused by the needle sticks to the muscle and the blood that will inevitably be lost into the surrounding muscle tissue. Blood tends to be very irritating to tissues, which tend to react by going into spasm. These spasms may start the same day of your procedure, but they may also take days to develop. This late onset type of spasm or cramp is usually caused by electrolyte imbalances triggered by the steroids, at the level of the kidney. Cramps and spasms tend to respond  well to muscle relaxants, multivitamins (some are triggered by the procedure, but may have their origins in vitamin deficiencies), and "Gatorade", or any sports drinks that can replenish any electrolyte imbalances. (If you are a diabetic, ask your pharmacist to get you a sugar-free brand.) Warm showers or baths may also be helpful. Stretching exercises are highly recommended. General Instructions:  Be alert for signs of possible infection: redness, swelling, heat, red streaks, elevated temperature, and/or fever. These typically appear 4 to 6 days after the procedure. Immediately notify your doctor if you experience unusual bleeding, difficulty breathing, or loss of bowel or bladder control. If you experience increased pain, do not increase your pain medicine intake, unless instructed by your pain physician. Post-Procedure Care:  Be careful in moving about. Muscle spasms in the area of the injection may occur. Applying ice or heat to the area is often helpful. The incidence of spinal headaches after epidural injections ranges between 1.4% and 6%. If you develop a headache that does not seem to respond to conservative therapy, please let your physician know. This can be treated with an epidural blood patch.   Post-procedure numbness or redness is to be expected, however it should average 4 to 6 hours. If numbness and weakness of your extremities begins to develop 4 to 6 hours after your procedure, and is felt to be progressing and worsening, immediately contact your physician.   Diet:  If you experience nausea, do not eat until this sensation goes away. If you had a "Stellate Ganglion Block" for upper extremity "Reflex Sympathetic  Dystrophy", do not eat or drink until your hoarseness goes away. In any case, always start with liquids first and if you tolerate them well, then slowly progress to more solid foods. Activity:  For the first 4 to 6 hours after the procedure, use caution in moving about as you may  experience numbness and/or weakness. Use caution in cooking, using household electrical appliances, and climbing steps. If you need to reach your Doctor call our office: [phone] Monday-Thursday 8:00 am - 4:00 PM    Fridays: Closed     In case of an emergency: In case of emergency, call 911 or go to the nearest emergency room and have the physician there call us.  Interpretation of Procedure Every nerve block has two components: a diagnostic component, and a treatment component. Unrealistic expectations are the most common causes of "perceived failure".  In a perfect world, a single nerve block should be able to completely and permanently eliminate the pain. Sadly, the world is not perfect.  Most pain management nerve blocks are performed using local anesthetics and steroids. Steroids are responsible for any long-term benefit that you may experience. Their purpose is to decrease any chronic swelling that may exist in the area. Steroids begin to work immediately after being injected. However, most patients will not experience any benefits until 5 to 10 days after the injection, when the swelling has come down to the point where they can tell a difference. Steroids will only help if there is swelling to be treated. As such, they can assist with the diagnosis. If effective, they suggest an inflammatory component to the pain, and if ineffective, they rule out inflammation as the main cause or component of the problem. If the problem is one of mechanical compression, you will get no benefit from those steroids.   In the case of local anesthetics, they have a crucial role in the diagnosis of your condition. Most will begin to work within15 to 20 minutes after injection. The duration will depend on the type used (short- vs. Long-acting). It is of outmost importance that patients keep tract of their pain, after the procedure. To assist with this matter, a "Post-procedure Pain Diary" is provided. Make sure  to complete it and to bring it back to your follow-up appointment.  As long as the patient keeps accurate, detailed records of their symptoms after every procedure, and returns to have those interpreted, every procedure will provide [place] with invaluable information. Even a block that does not provide the patient with any relief, will always provide [place] with information about the mechanism and the origin of the pain. The only time a nerve block can be considered a waste of time is when patients do not keep track of the results, or do not keep their post-procedure appointment.  Reporting the results back to your physician The Pain Score  Pain is a subjective complaint. It cannot be seen, touched, or measured. We depend entirely on the patient's report of the pain in order to assess your condition and treatment. To evaluate the pain, we use a pain scale, where "0" means "No Pain", and a "10" is "the worst possible pain that you can even imagine" (i.e. something like been eaten alive by a shark or being torn apart by a lion).   You will frequently be asked to rate your pain. Please be as accurate, remember that medical decisions will be based on your responses. Please do not rate your pain above a 10. Doing so  is actually interpreted as "symptom magnification" (exaggeration), as well as lack of understanding with regards to the scale. To put this into perspective, when you tell us that your pain is at a 10 (ten), what you are saying is that there is nothing we can do to make this pain any worse. (Carefully think about that.)Preparing for Procedure with Sedation Instructions: . Oral Intake: Do not eat or drink anything for at least 8 hours prior to your procedure. . Transportation: Public transportation is not allowed. Bring an adult driver. The driver must be physically present in our waiting room before any procedure can be started. [name] Physical Assistance: Bring an adult capable of physically assisting you, in  the event you need help. . Blood Pressure Medicine: Take your blood pressure medicine with a sip of water the morning of the procedure. . Insulin: Take only  of your normal insulin dose. . Preventing infections: Shower with an antibacterial soap the morning of your procedure. . Build-up your immune system: Take 1000 mg of Vitamin C with every meal (3 times a day) the day prior to your procedure. . Pregnancy: If you are pregnant, call and cancel the procedure. . Sickness: If you have a cold, fever, or any active infections, call and cancel the procedure. . Arrival: You must be in the facility at least 30 minutes prior to your scheduled procedure. . Children: Do not bring children with you. . Dress appropriately: Bring dark clothing that you would not mind if they get stained. . Valuables: Do not bring any jewelry or valuables. Procedure appointments are reserved for interventional treatments only. [name] No Prescription Refills. . No medication changes will be discussed during procedure appointments. No disability issues will be discussed.

## 2018-03-12 NOTE — Progress Notes (Signed)
Patient's Name: Pamela Romero  MRN: [id number]  Referring Provider: Gillis Santa, MD  DOB: [birth year]  PCP: Ronnell Freshwater, NP  DOS: 03/12/2018  Note by: Gillis Santa, MD  Service setting: Ambulatory outpatient  Specialty: Interventional Pain Management  Patient type: Established  Location: [facility] (AMB) Pain Management Facility  Visit type: Interventional Procedure   Primary Reason for Visit: Interventional Pain Management Treatment. CC: Leg Pain (right)  Procedure:          Anesthesia, Analgesia, Anxiolysis:  Type: Therapeutic Superior-lateral, Superior-medial, and Inferior-medial, Genicular Nerve Radiofrequency Ablation.  #1  Region: Lateral, Anterior, and Medial aspects of the knee joint, above and below the knee joint proper. Level: Superior and inferior to the knee joint. Laterality: Right  Type: Moderate (Conscious) Sedation combined with Local Anesthesia Indication(s): Analgesia and Anxiety Route: Intravenous (IV) IV Access: Secured Sedation: Meaningful verbal contact was maintained at all times during the procedure  Local Anesthetic: Lidocaine 1-2%  Position: Supine   Indications: 1. Bilateral primary osteoarthritis of knee    Pamela Romero has been dealing with the above chronic pain for longer than three months and has either failed to respond, was unable to tolerate, or simply did not get enough benefit from other more conservative therapies including, but not limited to: 1. Over-the-counter medications 2. Anti-inflammatory medications 3. Muscle relaxants 4. Membrane stabilizers 5. Opioids 6. Physical therapy and/or chiropractic manipulation 7. Modalities (Heat, ice, etc.) 8. Invasive techniques such as nerve blocks. Pamela Romero has attained more than 50% relief of the pain from a series of diagnostic injections conducted in separate occasions.  Pain Score: Pre-procedure: 5 /10 Post-procedure: 0-No pain/10  Pre-op Assessment:  Pamela Romero is a 67 y.o. (year  old), female patient, seen today for interventional treatment. She  has a past surgical history that includes Thyroidectomy (2005); Cardiac catheterization (2006); Total abdominal hysterectomy; Breast surgery (Right, March 2014); and Breast cyst aspiration (Right). Pamela Romero has a current medication list which includes the following prescription(s): docusate sodium, duloxetine, esomeprazole, hydrocodone-acetaminophen, lisinopril, ranitidine, rosuvastatin, hydrocodone-acetaminophen, and tramadol, and the following Facility-Administered Medications: fentanyl. Her primarily concern today is the Leg Pain (right)  Initial Vital Signs:  Pulse/HCG Rate: 94ECG Heart Rate: 86 Temp: 97.8 F (36.6 C) Resp: 16 BP: (!) 175/82 SpO2: 96 %  BMI: Estimated body mass index is 30.72 kg/m as calculated from the following:   Height as of this encounter: 5' 8.5" (1.74 m).   Weight as of this encounter: 205 lb (93 kg).  Risk Assessment: Allergies: Reviewed. She has No Known Allergies.  Allergy Precautions: None required Coagulopathies: Reviewed. None identified.  Blood-thinner therapy: None at this time Active Infection(s): Reviewed. None identified. Pamela Romero is afebrile  Site Confirmation: Pamela Romero was asked to confirm the procedure and laterality before marking the site Procedure checklist: Completed Consent: Before the procedure and under the influence of no sedative(s), amnesic(s), or anxiolytics, the patient was informed of the treatment options, risks and possible complications. To fulfill our ethical and legal obligations, as recommended by the American Medical Association's Code of Ethics, I have informed the patient of my clinical impression; the nature and purpose of the treatment or procedure; the risks, benefits, and possible complications of the intervention; the alternatives, including doing nothing; the risk(s) and benefit(s) of the alternative treatment(s) or procedure(s); and the risk(s)  and benefit(s) of doing nothing. The patient was provided information about the general risks and possible complications associated with the procedure. These may include, but are not limited to:  failure to achieve desired goals, infection, bleeding, organ or nerve damage, allergic reactions, paralysis, and death. In addition, the patient was informed of those risks and complications associated to the procedure, such as failure to decrease pain; infection; bleeding; organ or nerve damage with subsequent damage to sensory, motor, and/or autonomic systems, resulting in permanent pain, numbness, and/or weakness of one or several areas of the body; allergic reactions; (i.e.: anaphylactic reaction); and/or death. Furthermore, the patient was informed of those risks and complications associated with the medications. These include, but are not limited to: allergic reactions (i.e.: anaphylactic or anaphylactoid reaction(s)); adrenal axis suppression; blood sugar elevation that in diabetics may result in ketoacidosis or comma; water retention that in patients with history of congestive heart failure may result in shortness of breath, pulmonary edema, and decompensation with resultant heart failure; weight gain; swelling or edema; medication-induced neural toxicity; particulate matter embolism and blood vessel occlusion with resultant organ, and/or nervous system infarction; and/or aseptic necrosis of one or more joints. Finally, the patient was informed that Medicine is not an exact science; therefore, there is also the possibility of unforeseen or unpredictable risks and/or possible complications that may result in a catastrophic outcome. The patient indicated having understood very clearly. We have given the patient no guarantees and we have made no promises. Enough time was given to the patient to ask questions, all of which were answered to the patient's satisfaction. Pamela Romero has indicated that she wanted to  continue with the procedure. Attestation: I, the ordering provider, attest that I have discussed with the patient the benefits, risks, side-effects, alternatives, likelihood of achieving goals, and potential problems during recovery for the procedure that I have provided informed consent. Date  Time: 03/12/2018  7:53 AM  Pre-Procedure Preparation:  Monitoring: As per clinic protocol. Respiration, ETCO2, SpO2, BP, heart rate and rhythm monitor placed and checked for adequate function Safety Precautions: Patient was assessed for positional comfort and pressure points before starting the procedure. Time-out: I initiated and conducted the "Time-out" before starting the procedure, as per protocol. The patient was asked to participate by confirming the accuracy of the "Time Out" information. Verification of the correct person, site, and procedure were performed and confirmed by me, the nursing staff, and the patient. "Time-out" conducted as per Joint Commission's Universal Protocol (UP.01.01.01). Time: 0828  Description of Procedure:          Target Area: For Genicular Nerve block(s), the targets are: the superior-lateral genicular nerve, located in the lateral distal portion of the femoral shaft as it curves to form the lateral epicondyle, in the region of the distal femoral metaphysis; the superior-medial genicular nerve, located in the medial distal portion of the femoral shaft as it curves to form the medial epicondyle; and the inferior-medial genicular nerve, located in the medial, proximal portion of the tibial shaft, as it curves to form the medial epicondyle, in the region of the proximal tibial metaphysis. Approach: Anterior, ipsilateral approach. Area Prepped: Entire knee area, from mid-thigh to mid-shin, lateral, anterior, and medial aspects. Prepping solution: Hibiclens (4.0% Chlorhexidine gluconate solution) Safety Precautions: Aspiration looking for blood return was conducted prior to all  injections. At no point did we inject any substances, as a needle was being advanced. No attempts were made at seeking any paresthesias. Safe injection practices and needle disposal techniques used. Medications properly checked for expiration dates. SDV (single dose vial) medications used. Description of the Procedure: Protocol guidelines were followed. The patient was placed in position over  the procedure table. The target area was identified and the area prepped in the usual manner. The skin and muscle were infiltrated with local anesthetic. Appropriate amount of time allowed to pass for local anesthetics to take effect. Radiofrequency needles were introduced to the target area using fluoroscopic guidance. Using the NeuroTherm NT1100 Radiofrequency Generator, sensory stimulation using 50 Hz was used to locate & identify the nerve, making sure that the needle was positioned such that there was no sensory stimulation below 0.3 V or above 0.7 V. Stimulation using 2 Hz was used to evaluate the motor component. Care was taken not to lesion any nerves that demonstrated motor stimulation of the lower extremities at an output of less than 2.5 times that of the sensory threshold, or a maximum of 2.0 V. Once satisfactory placement of the needles was achieved, the numbing solution was slowly injected after negative aspiration. After waiting for at least 2 minutes, the ablation was performed at 80 degrees C for 60 seconds, using regular Radiofrequency settings. Once the procedure was completed, the needles were then removed and the area cleansed, making sure to leave some of the prepping solution back to take advantage of its long term bactericidal properties. Intra-operative Compliance: Compliant Vitals:   03/12/18 0846 03/12/18 0856 03/12/18 0907 03/12/18 0917  BP: (!) 145/105 (!) 156/92 (!) 155/93 (!) 165/97  Pulse:      Resp: 20 16 18 19   Temp:  97.6 F (36.4 C)  99 F (37.2 C)  TempSrc:      SpO2: 98% 100%  100% 100%  Weight:      Height:        Start Time: 0828 hrs. End Time: 0846 hrs. Materials & Medications:  Needle(s) Type: Teflon-coated, curved tip, Radiofrequency needle(s) Gauge: 22G Length: 10cm Medication(s): Please see orders for medications and dosing details. 6 solution made a 5 cc of 0.2% prilocaine, 1 cc of Decadron 10 mg/cc.  2 cc injected at each level prior to lesioning Imaging Guidance (Non-Spinal):          Type of Imaging Technique: Fluoroscopy Guidance (Non-Spinal) Indication(s): Assistance in needle guidance and placement for procedures requiring needle placement in or near specific anatomical locations not easily accessible without such assistance. Exposure Time: Please see nurses notes. Contrast: Before injecting any contrast, we confirmed that the patient did not have an allergy to iodine, shellfish, or radiological contrast. Once satisfactory needle placement was completed at the desired level, radiological contrast was injected. Contrast injected under live fluoroscopy. No contrast complications. See chart for type and volume of contrast used. Fluoroscopic Guidance: I was personally present during the use of fluoroscopy. "Tunnel Vision Technique" used to obtain the best possible view of the target area. Parallax error corrected before commencing the procedure. "Direction-depth-direction" technique used to introduce the needle under continuous pulsed fluoroscopy. Once target was reached, antero-posterior, oblique, and lateral fluoroscopic projection used confirm needle placement in all planes. Images permanently stored in EMR. Interpretation: I personally interpreted the imaging intraoperatively. Adequate needle placement confirmed in multiple planes. Appropriate spread of contrast into desired area was observed. No evidence of afferent or efferent intravascular uptake. Permanent images saved into the patient's record.  Antibiotic Prophylaxis:   Anti-infectives (From  admission, onward)   None     Indication(s): None identified  Post-operative Assessment:  Post-procedure Vital Signs:  Pulse/HCG Rate: 9480 Temp: 99 F (37.2 C) Resp: 19 BP: (!) 165/97 SpO2: 100 %  EBL: None  Complications: No immediate post-treatment complications observed by team,  or reported by patient.  Note: The patient tolerated the entire procedure well. A repeat set of vitals were taken after the procedure and the patient was kept under observation following institutional policy, for this type of procedure. Post-procedural neurological assessment was performed, showing return to baseline, prior to discharge. The patient was provided with post-procedure discharge instructions, including a section on how to identify potential problems. Should any problems arise concerning this procedure, the patient was given instructions to immediately contact us, at any time, without hesitation. In any case, we plan to contact the patient by telephone for a follow-up status report regarding this interventional procedure.  Comments:  No additional relevant information.  Plan of Care    Imaging Orders     DG C-Arm 1-60 Min-No Report  Procedure Orders     Radiofrequency,Genicular  Medications ordered for procedure: Meds ordered this encounter  Medications  . lactated ringers infusion 1,000 mL  . fentaNYL (SUBLIMAZE) injection 25-100 mcg    Make sure Narcan is available in the pyxis when using this medication. In the event of respiratory depression (RR< 8/min): Titrate NARCAN (naloxone) in increments of 0.1 to 0.2 mg IV at 2-3 minute intervals, until desired degree of reversal.  . ropivacaine (PF) 2 mg/mL (0.2%) (NAROPIN) injection 10 mL  . lidocaine (XYLOCAINE) 2 % (with pres) injection 400 mg  . dexamethasone (DECADRON) injection 10 mg  . HYDROcodone-acetaminophen (NORCO) 7.5-325 MG tablet    Sig: Take 1 tablet by mouth 3 (three) times daily as needed for moderate pain.    Dispense:   90 tablet    Refill:  0    Do not place this medication, or any other prescription from our practice, on "Automatic Refill". Patient may have prescription filled one day early if pharmacy is closed on scheduled refill date.  [name] HYDROcodone-acetaminophen (NORCO) 7.5-325 MG tablet    Sig: Take 1 tablet by mouth every 8 (eight) hours as needed for moderate pain.    Dispense:  90 tablet    Refill:  0    Do not place this medication, or any other prescription from our practice, on "Automatic Refill". Patient may have prescription filled one day early if pharmacy is closed on scheduled refill date.    [place] PMP checked and appropriate. Refill of chronic pain meds as above.  Medications administered: We administered lactated ringers, fentaNYL, ropivacaine (PF) 2 mg/mL (0.2%), lidocaine, and dexamethasone.  See the medical record for exact dosing, route, and time of administration.  Disposition: Discharge home  Discharge Date & Time: 03/12/2018;   hrs.   Physician-requested Follow-up: Return in about 2 weeks (around 03/26/2018) for Contra-lateral Procedure.  Future Appointments  Date Time Provider [place]  03/26/2018  8:45 AM Gillis Santa, MD [facility] None   Primary Care Physician: Ronnell Freshwater, NP Location: [facility] Note by: Gillis Santa, MD Date: 03/12/2018; Time: 9:35 AM  Disclaimer:  Medicine is not an exact science. The only guarantee in medicine is that nothing is guaranteed. It is important to note that the decision to proceed with this intervention was based on the information collected from the patient. The Data and conclusions were drawn from the patient's questionnaire, the interview, and the physical examination. Because the information was provided in large part by the patient, it cannot be guaranteed that it has not been purposely or unconsciously manipulated. Every effort has been made to obtain as much relevant data as possible for  this evaluation. It is important to  note that the conclusions that lead to this procedure are derived in large part from the available data. Always take into account that the treatment will also be dependent on availability of resources and existing treatment guidelines, considered by other Pain Management Practitioners as being common knowledge and practice, at the time of the intervention. For Medico-Legal purposes, it is also important to point out that variation in procedural techniques and pharmacological choices are the acceptable norm. The indications, contraindications, technique, and results of the above procedure should only be interpreted and judged by a Board-Certified Interventional Pain Specialist with extensive familiarity and expertise in the same exact procedure and technique.

## 2018-03-13 ENCOUNTER — Telehealth: Payer: Self-pay

## 2018-03-13 NOTE — Telephone Encounter (Signed)
Post procedure phone call.  Patient states she is doing god.

## 2018-03-26 ENCOUNTER — Ambulatory Visit: Payer: BLUE CROSS/BLUE SHIELD | Admitting: Student in an Organized Health Care Education/Training Program

## 2018-04-16 ENCOUNTER — Ambulatory Visit (HOSPITAL_BASED_OUTPATIENT_CLINIC_OR_DEPARTMENT_OTHER): Payer: BLUE CROSS/BLUE SHIELD | Admitting: Student in an Organized Health Care Education/Training Program

## 2018-04-16 ENCOUNTER — Ambulatory Visit
Admission: RE | Admit: 2018-04-16 | Discharge: 2018-04-16 | Disposition: A | Discharge status: Home / Self Care | Payer: BLUE CROSS/BLUE SHIELD | Source: Ambulatory Visit | Attending: Student in an Organized Health Care Education/Training Program | Admitting: Student in an Organized Health Care Education/Training Program

## 2018-04-16 ENCOUNTER — Other Ambulatory Visit: Payer: Self-pay

## 2018-04-16 ENCOUNTER — Encounter: Payer: Self-pay | Admitting: Student in an Organized Health Care Education/Training Program

## 2018-04-16 VITALS — BP 115/93 | HR 75 | Temp 97.2°F | Resp 14 | Ht 68.0 in | Wt 205.0 lb

## 2018-04-16 DIAGNOSIS — M1712 Unilateral primary osteoarthritis, left knee: Secondary | ICD-10-CM

## 2018-04-16 MED ORDER — LIDOCAINE HCL 2 % IJ SOLN
20.0000 mL | Freq: Once | INTRAMUSCULAR | Status: AC
  Administered 2018-04-16: 400 mg
  Filled 2018-04-16: qty 20

## 2018-04-16 MED ORDER — FENTANYL CITRATE (PF) 100 MCG/2ML IJ SOLN
25.0000 ug | INTRAMUSCULAR | Status: DC | PRN
  Administered 2018-04-16: 100 ug via INTRAVENOUS
  Filled 2018-04-16: qty 2

## 2018-04-16 MED ORDER — ROPIVACAINE HCL 2 MG/ML IJ SOLN
10.0000 mL | Freq: Once | INTRAMUSCULAR | Status: AC
  Administered 2018-04-16: 10 mL
  Filled 2018-04-16: qty 10

## 2018-04-16 MED ORDER — HYDROCODONE-ACETAMINOPHEN 7.5-325 MG PO TABS: 1.0000 | ORAL_TABLET | Freq: Three times a day (TID) | ORAL | 0 refills | Status: DC | PRN

## 2018-04-16 MED ORDER — LACTATED RINGERS IV SOLN
1000.0000 mL | Freq: Once | INTRAVENOUS | Status: AC
  Administered 2018-04-16: 1000 mL via INTRAVENOUS

## 2018-04-16 MED ORDER — DEXAMETHASONE SODIUM PHOSPHATE 10 MG/ML IJ SOLN
10.0000 mg | Freq: Once | INTRAMUSCULAR | Status: AC
  Administered 2018-04-16: 10 mg
  Filled 2018-04-16: qty 1

## 2018-04-16 NOTE — Patient Instructions (Addendum)
Pain Management Discharge Instructions  General Discharge Instructions :  If you need to reach your doctor call: Monday-Friday 8:00 am - 4:00 pm at [phone] or toll free [phone].  After clinic hours [phone] to have operator reach doctor.  Bring all of your medication bottles to all your appointments in the pain clinic.  To cancel or reschedule your appointment with Pain Management please remember to call 24 hours in advance to avoid a fee.  Refer to the educational materials which you have been given on: General Risks, I had my Procedure. Discharge Instructions, Post Sedation.  Post Procedure Instructions:  The drugs you were given will stay in your system until tomorrow, so for the next 24 hours you should not drive, make any legal decisions or drink any alcoholic beverages.  You may eat anything you prefer, but it is better to start with liquids then soups and crackers, and gradually work up to solid foods.  Please notify your doctor immediately if you have any unusual bleeding, trouble breathing or pain that is not related to your normal pain.  Depending on the type of procedure that was done, some parts of your body may feel week and/or numb.  This usually clears up by tonight or the next day.  Walk with the use of an assistive device or accompanied by an adult for the 24 hours.  You may use ice on the affected area for the first 24 hours.  Put ice in a Ziploc bag and cover with a towel and place against area 15 minutes on 15 minutes off.  You may switch to heat after 24 hours.  Hydrocodone 7.5 escribed. To pick up at  Pharmacy.

## 2018-04-16 NOTE — Progress Notes (Signed)
Patient's Name: Pamela Romero  MRN: [id number]  Referring Provider: Ronnell Freshwater, NP  DOB: [birth year]  PCP: Ronnell Freshwater, NP  DOS: 04/16/2018  Note by: Gillis Santa, MD  Service setting: Ambulatory outpatient  Specialty: Interventional Pain Management  Patient type: Established  Location: [facility] (AMB) Pain Management Facility  Visit type: Interventional Procedure   Primary Reason for Visit: Interventional Pain Management Treatment. CC: Knee Pain (left)  Procedure:          Anesthesia, Analgesia, Anxiolysis:  Type: Therapeutic Superior-lateral, Superior-medial, and Inferior-medial, Genicular Nerve Radiofrequency Ablation.  #1  Region: Lateral, Anterior, and Medial aspects of the knee joint, above and below the knee joint proper. Level: Superior and inferior to the knee joint. Laterality: Left  Type: Moderate (Conscious) Sedation combined with Local Anesthesia Indication(s): Analgesia and Anxiety Route: Intravenous (IV) IV Access: Secured Sedation: Meaningful verbal contact was maintained at all times during the procedure  Local Anesthetic: Lidocaine 1-2%  Position: Supine   Indications: 1. Primary osteoarthritis of left knee    Pamela Romero has been dealing with the above chronic pain for longer than three months and has either failed to respond, was unable to tolerate, or simply did not get enough benefit from other more conservative therapies including, but not limited to: 1. Over-the-counter medications 2. Anti-inflammatory medications 3. Muscle relaxants 4. Membrane stabilizers 5. Opioids 6. Physical therapy and/or chiropractic manipulation 7. Modalities (Heat, ice, etc.) 8. Invasive techniques such as nerve blocks. Pamela Romero has attained more than 50% relief of the pain from a series of diagnostic injections conducted in separate occasions.  Pain Score: Pre-procedure: 7 /10 Post-procedure: 0-No pain/10  Pre-op Assessment:  [name] is a 68 y.o. (year old),  female patient, seen today for interventional treatment. She  has a past surgical history that includes Thyroidectomy (2005); Cardiac catheterization (2006); Total abdominal hysterectomy; Breast surgery (Right, March 2014); and Breast cyst aspiration (Right). [name] has a current medication list which includes the following prescription(s): docusate sodium, duloxetine, esomeprazole, hydrocodone-acetaminophen, lisinopril, ranitidine, rosuvastatin, and tramadol, and the following Facility-Administered Medications: fentanyl. Her primarily concern today is the Knee Pain (left)  Initial Vital Signs:  Pulse/HCG Rate: 75ECG Heart Rate: 79 Temp: 98.3 F (36.8 C) Resp: 18 BP: (!) 146/96 SpO2: 98 %  BMI: Estimated body mass index is 31.17 kg/m as calculated from the following:   Height as of this encounter: 5\' 8"  (1.727 m).   Weight as of this encounter: 205 lb (93 kg).  Risk Assessment: Allergies: Reviewed. She has No Known Allergies.  Allergy Precautions: None required Coagulopathies: Reviewed. None identified.  Blood-thinner therapy: None at this time Active Infection(s): Reviewed. None identified. Pamela Romero is afebrile  Site Confirmation: Pamela Romero was asked to confirm the procedure and laterality before marking the site Procedure checklist: Completed Consent: Before the procedure and under the influence of no sedative(s), amnesic(s), or anxiolytics, the patient was informed of the treatment options, risks and possible complications. To fulfill our ethical and legal obligations, as recommended by the American Medical Association's Code of Ethics, I have informed the patient of my clinical impression; the nature and purpose of the treatment or procedure; the risks, benefits, and possible complications of the intervention; the alternatives, including doing nothing; the risk(s) and benefit(s) of the alternative treatment(s) or procedure(s); and the risk(s) and benefit(s) of doing  nothing. The patient was provided information about the general risks and possible complications associated with the procedure. These may include, but are not limited to:  failure to achieve desired goals, infection, bleeding, organ or nerve damage, allergic reactions, paralysis, and death. In addition, the patient was informed of those risks and complications associated to the procedure, such as failure to decrease pain; infection; bleeding; organ or nerve damage with subsequent damage to sensory, motor, and/or autonomic systems, resulting in permanent pain, numbness, and/or weakness of one or several areas of the body; allergic reactions; (i.e.: anaphylactic reaction); and/or death. Furthermore, the patient was informed of those risks and complications associated with the medications. These include, but are not limited to: allergic reactions (i.e.: anaphylactic or anaphylactoid reaction(s)); adrenal axis suppression; blood sugar elevation that in diabetics may result in ketoacidosis or comma; water retention that in patients with history of congestive heart failure may result in shortness of breath, pulmonary edema, and decompensation with resultant heart failure; weight gain; swelling or edema; medication-induced neural toxicity; particulate matter embolism and blood vessel occlusion with resultant organ, and/or nervous system infarction; and/or aseptic necrosis of one or more joints. Finally, the patient was informed that Medicine is not an exact science; therefore, there is also the possibility of unforeseen or unpredictable risks and/or possible complications that may result in a catastrophic outcome. The patient indicated having understood very clearly. We have given the patient no guarantees and we have made no promises. Enough time was given to the patient to ask questions, all of which were answered to the patient's satisfaction. Pamela Romero has indicated that she wanted to continue with the  procedure. Attestation: I, the ordering provider, attest that I have discussed with the patient the benefits, risks, side-effects, alternatives, likelihood of achieving goals, and potential problems during recovery for the procedure that I have provided informed consent. Date  Time: 04/16/2018  8:50 AM  Pre-Procedure Preparation:  Monitoring: As per clinic protocol. Respiration, ETCO2, SpO2, BP, heart rate and rhythm monitor placed and checked for adequate function Safety Precautions: Patient was assessed for positional comfort and pressure points before starting the procedure. Time-out: I initiated and conducted the "Time-out" before starting the procedure, as per protocol. The patient was asked to participate by confirming the accuracy of the "Time Out" information. Verification of the correct person, site, and procedure were performed and confirmed by me, the nursing staff, and the patient. "Time-out" conducted as per Joint Commission's Universal Protocol (UP.01.01.01). Time: 0940  Description of Procedure:          Target Area: For Genicular Nerve block(s), the targets are: the superior-lateral genicular nerve, located in the lateral distal portion of the femoral shaft as it curves to form the lateral epicondyle, in the region of the distal femoral metaphysis; the superior-medial genicular nerve, located in the medial distal portion of the femoral shaft as it curves to form the medial epicondyle; and the inferior-medial genicular nerve, located in the medial, proximal portion of the tibial shaft, as it curves to form the medial epicondyle, in the region of the proximal tibial metaphysis. Approach: Anterior, ipsilateral approach. Area Prepped: Entire knee area, from mid-thigh to mid-shin, lateral, anterior, and medial aspects. Prepping solution: Hibiclens (4.0% Chlorhexidine gluconate solution) Safety Precautions: Aspiration looking for blood return was conducted prior to all injections. At no point  did we inject any substances, as a needle was being advanced. No attempts were made at seeking any paresthesias. Safe injection practices and needle disposal techniques used. Medications properly checked for expiration dates. SDV (single dose vial) medications used. Description of the Procedure: Protocol guidelines were followed. The patient was placed in position over  the procedure table. The target area was identified and the area prepped in the usual manner. The skin and muscle were infiltrated with local anesthetic. Appropriate amount of time allowed to pass for local anesthetics to take effect. Radiofrequency needles were introduced to the target area using fluoroscopic guidance. Using the NeuroTherm NT1100 Radiofrequency Generator, sensory stimulation using 50 Hz was used to locate & identify the nerve, making sure that the needle was positioned such that there was no sensory stimulation below 0.3 V or above 0.7 V. Stimulation using 2 Hz was used to evaluate the motor component. Care was taken not to lesion any nerves that demonstrated motor stimulation of the lower extremities at an output of less than 2.5 times that of the sensory threshold, or a maximum of 2.0 V. Once satisfactory placement of the needles was achieved, the numbing solution was slowly injected after negative aspiration. After waiting for at least 2 minutes, the ablation was performed at 80 degrees C for 60 seconds, using regular Radiofrequency settings. Once the procedure was completed, the needles were then removed and the area cleansed, making sure to leave some of the prepping solution back to take advantage of its long term bactericidal properties. Intra-operative Compliance: Compliant Vitals:   04/16/18 1000 04/16/18 1010 04/16/18 1020 04/16/18 1030  BP: (!) 153/92 (!) 146/85 (!) 154/99 (!) 115/93  Pulse:      Resp: 20 19 18 14   Temp:  97.9 F (36.6 C)  (!) 97.2 F (36.2 C)  TempSrc:      SpO2: 100% 98% 100% 100%  Weight:       Height:        Start Time: 0940 hrs. End Time: 1000 hrs. Materials & Medications:  Needle(s) Type: Teflon-coated, curved tip, Radiofrequency needle(s) Gauge: 22G Length: 10cm Medication(s): Please see orders for medications and dosing details. 6 solution made a 5 cc of 0.2% Ropivacaine, 1 cc of Decadron 10 mg/cc.  2 cc injected at each level prior to lesioning Imaging Guidance (Non-Spinal):          Type of Imaging Technique: Fluoroscopy Guidance (Non-Spinal) Indication(s): Assistance in needle guidance and placement for procedures requiring needle placement in or near specific anatomical locations not easily accessible without such assistance. Exposure Time: Please see nurses notes. Contrast: Before injecting any contrast, we confirmed that the patient did not have an allergy to iodine, shellfish, or radiological contrast. Once satisfactory needle placement was completed at the desired level, radiological contrast was injected. Contrast injected under live fluoroscopy. No contrast complications. See chart for type and volume of contrast used. Fluoroscopic Guidance: I was personally present during the use of fluoroscopy. "Tunnel Vision Technique" used to obtain the best possible view of the target area. Parallax error corrected before commencing the procedure. "Direction-depth-direction" technique used to introduce the needle under continuous pulsed fluoroscopy. Once target was reached, antero-posterior, oblique, and lateral fluoroscopic projection used confirm needle placement in all planes. Images permanently stored in EMR. Interpretation: I personally interpreted the imaging intraoperatively. Adequate needle placement confirmed in multiple planes. Appropriate spread of contrast into desired area was observed. No evidence of afferent or efferent intravascular uptake. Permanent images saved into the patient's record.  Antibiotic Prophylaxis:   Anti-infectives (From admission, onward)   None      Indication(s): None identified  Post-operative Assessment:  Post-procedure Vital Signs:  Pulse/HCG Rate: 7573 Temp: (!) 97.2 F (36.2 C) Resp: 14 BP: (!) 115/93 SpO2: 100 %  EBL: None  Complications: No immediate post-treatment complications observed  by team, or reported by patient.  Note: The patient tolerated the entire procedure well. A repeat set of vitals were taken after the procedure and the patient was kept under observation following institutional policy, for this type of procedure. Post-procedural neurological assessment was performed, showing return to baseline, prior to discharge. The patient was provided with post-procedure discharge instructions, including a section on how to identify potential problems. Should any problems arise concerning this procedure, the patient was given instructions to immediately contact us, at any time, without hesitation. In any case, we plan to contact the patient by telephone for a follow-up status report regarding this interventional procedure.  Comments:  No additional relevant information.  Plan of Care    Imaging Orders     DG C-Arm 1-60 Min-No Report     DG Ankle Complete Left     DG Ankle Complete Right Procedure Orders    No procedure(s) ordered today    Medications ordered for procedure: Meds ordered this encounter  Medications  . lactated ringers infusion 1,000 mL  . fentaNYL (SUBLIMAZE) injection 25-100 mcg    Make sure Narcan is available in the pyxis when using this medication. In the event of respiratory depression (RR< 8/min): Titrate NARCAN (naloxone) in increments of 0.1 to 0.2 mg IV at 2-3 minute intervals, until desired degree of reversal.  . ropivacaine (PF) 2 mg/mL (0.2%) (NAROPIN) injection 10 mL  . lidocaine (XYLOCAINE) 2 % (with pres) injection 400 mg  . dexamethasone (DECADRON) injection 10 mg  . HYDROcodone-acetaminophen (NORCO) 7.5-325 MG tablet    Sig: Take 1 tablet by mouth every 8 (eight) hours as  needed for moderate pain.    Dispense:  90 tablet    Refill:  0    Do not place this medication, or any other prescription from our practice, on "Automatic Refill". Patient may have prescription filled one day early if pharmacy is closed on scheduled refill date.    [place] PMP checked and appropriate. Refill of chronic pain meds as above for 1 month, fill date 05/10/2018  Medications administered: We administered lactated ringers, fentaNYL, ropivacaine (PF) 2 mg/mL (0.2%), lidocaine, and dexamethasone.  See the medical record for exact dosing, route, and time of administration.  Disposition: Discharge home  Discharge Date & Time: 04/16/2018; 1035 hrs.   Physician-requested Follow-up: Return in about 7 weeks (around 06/04/2018) for Post Procedure Evaluation, Medication Management.  Future Appointments  Date Time Provider [place]  05/31/2018  9:30 AM Gillis Santa, MD [facility] None   Primary Care Physician: Ronnell Freshwater, NP Location: [facility] Note by: Gillis Santa, MD Date: 04/16/2018; Time: 1:15 PM  Disclaimer:  Medicine is not an exact science. The only guarantee in medicine is that nothing is guaranteed. It is important to note that the decision to proceed with this intervention was based on the information collected from the patient. The Data and conclusions were drawn from the patient's questionnaire, the interview, and the physical examination. Because the information was provided in large part by the patient, it cannot be guaranteed that it has not been purposely or unconsciously manipulated. Every effort has been made to obtain as much relevant data as possible for this evaluation. It is important to note that the conclusions that lead to this procedure are derived in large part from the available data. Always take into account that the treatment will also be dependent on availability of resources and existing treatment guidelines, considered by  other Pain Management Practitioners  as being common knowledge and practice, at the time of the intervention. For Medico-Legal purposes, it is also important to point out that variation in procedural techniques and pharmacological choices are the acceptable norm. The indications, contraindications, technique, and results of the above procedure should only be interpreted and judged by a Board-Certified Interventional Pain Specialist with extensive familiarity and expertise in the same exact procedure and technique.

## 2018-04-17 ENCOUNTER — Telehealth: Payer: Self-pay

## 2018-04-17 NOTE — Telephone Encounter (Signed)
Phone call was made to pt, no answer and no answering service.

## 2018-05-09 ENCOUNTER — Other Ambulatory Visit: Payer: Self-pay

## 2018-05-09 DIAGNOSIS — I10 Essential (primary) hypertension: Secondary | ICD-10-CM

## 2018-05-09 MED ORDER — LEVOTHYROXINE SODIUM 125 MCG PO TABS: ORAL_TABLET | ORAL | 3 refills | Status: DC

## 2018-05-09 MED ORDER — LISINOPRIL 10 MG PO TABS: 10.0000 mg | ORAL_TABLET | Freq: Every day | ORAL | 3 refills | Status: DC

## 2018-05-22 ENCOUNTER — Ambulatory Visit
Admission: RE | Admit: 2018-05-22 | Discharge: 2018-05-22 | Disposition: A | Discharge status: Home / Self Care | Payer: BLUE CROSS/BLUE SHIELD | Source: Ambulatory Visit | Attending: Student in an Organized Health Care Education/Training Program | Admitting: Student in an Organized Health Care Education/Training Program

## 2018-05-22 DIAGNOSIS — M1712 Unilateral primary osteoarthritis, left knee: Secondary | ICD-10-CM

## 2018-05-31 ENCOUNTER — Ambulatory Visit
Payer: BLUE CROSS/BLUE SHIELD | Attending: Student in an Organized Health Care Education/Training Program | Admitting: Student in an Organized Health Care Education/Training Program

## 2018-05-31 ENCOUNTER — Encounter: Payer: Self-pay | Admitting: Student in an Organized Health Care Education/Training Program

## 2018-05-31 ENCOUNTER — Other Ambulatory Visit: Payer: Self-pay

## 2018-05-31 ENCOUNTER — Telehealth: Payer: Self-pay | Admitting: Student in an Organized Health Care Education/Training Program

## 2018-05-31 VITALS — BP 151/107 | HR 79 | Temp 98.2°F | Resp 18 | Ht 68.0 in | Wt 207.0 lb

## 2018-05-31 DIAGNOSIS — Z79899 Other long term (current) drug therapy: Secondary | ICD-10-CM | POA: Diagnosis not present

## 2018-05-31 DIAGNOSIS — M1712 Unilateral primary osteoarthritis, left knee: Secondary | ICD-10-CM | POA: Diagnosis present

## 2018-05-31 DIAGNOSIS — G894 Chronic pain syndrome: Secondary | ICD-10-CM | POA: Diagnosis not present

## 2018-05-31 DIAGNOSIS — M170 Bilateral primary osteoarthritis of knee: Secondary | ICD-10-CM | POA: Diagnosis not present

## 2018-05-31 MED ORDER — HYDROCODONE-ACETAMINOPHEN 7.5-325 MG PO TABS: 1.0000 | ORAL_TABLET | Freq: Four times a day (QID) | ORAL | 0 refills | Status: DC | PRN

## 2018-05-31 MED ORDER — HYDROCODONE-ACETAMINOPHEN 7.5-325 MG PO TABS: 1.0000 | ORAL_TABLET | Freq: Four times a day (QID) | ORAL | 0 refills | Status: AC | PRN

## 2018-05-31 NOTE — Progress Notes (Signed)
Nursing Pain Medication Assessment:  Safety precautions to be maintained throughout the outpatient stay will include: orient to surroundings, keep bed in low position, maintain call bell within reach at all times, provide assistance with transfer out of bed and ambulation.  Medication Inspection Compliance: Pill count conducted under aseptic conditions, in front of the patient. Neither the pills nor the bottle was removed from the patient's sight at any time. Once count was completed pills were immediately returned to the patient in their original bottle.  Medication: Hydrocodone/APAP Pill/Patch Count: 0 of 90 pills remain Pill/Patch Appearance: Markings consistent with prescribed medication Bottle Appearance: Standard pharmacy container. Clearly labeled. Filled Date: 01 / 30 / 2019 Last Medication intake:  Today

## 2018-05-31 NOTE — Patient Instructions (Signed)
____________________________________________________________________________________________  Preparing for your procedure (without sedation)  Instructions: . Oral Intake: Do not eat or drink anything for at least 3 hours prior to your procedure. . Transportation: Unless otherwise stated by your physician, you may drive yourself after the procedure. . Blood Pressure Medicine: Take your blood pressure medicine with a sip of water the morning of the procedure. . Blood thinners: Notify our staff if you are taking any blood thinners. Depending on which one you take, there will be specific instructions on how and when to stop it. . Diabetics on insulin: Notify the staff so that you can be scheduled 1st case in the morning. If your diabetes requires high dose insulin, take only  of your normal insulin dose the morning of the procedure and notify the staff that you have done so. . Preventing infections: Shower with an antibacterial soap the morning of your procedure.  . Build-up your immune system: Take 1000 mg of Vitamin C with every meal (3 times a day) the day prior to your procedure. . Antibiotics: Inform the staff if you have a condition or reason that requires you to take antibiotics before dental procedures. . Pregnancy: If you are pregnant, call and cancel the procedure. . Sickness: If you have a cold, fever, or any active infections, call and cancel the procedure. . Arrival: You must be in the facility at least 30 minutes prior to your scheduled procedure. . Children: Do not bring any children with you. . Dress appropriately: Bring dark clothing that you would not mind if they get stained. . Valuables: Do not bring any jewelry or valuables.  Procedure appointments are reserved for interventional treatments only. . No Prescription Refills. . No medication changes will be discussed during procedure appointments. . No disability issues will be discussed.  Reasons to call and reschedule or  cancel your procedure: (Following these recommendations will minimize the risk of a serious complication.) . Surgeries: Avoid having procedures within 2 weeks of any surgery. (Avoid for 2 weeks before or after any surgery). . Flu Shots: Avoid having procedures within 2 weeks of a flu shots or . (Avoid for 2 weeks before or after immunizations). . Barium: Avoid having a procedure within 7-10 days after having had a radiological study involving the use of radiological contrast. (Myelograms, Barium swallow or enema study). . Heart attacks: Avoid any elective procedures or surgeries for the initial 6 months after a "Myocardial Infarction" (Heart Attack). . Blood thinners: It is imperative that you stop these medications before procedures. Let us know if you if you take any blood thinner.  . Infection: Avoid procedures during or within two weeks of an infection (including chest colds or gastrointestinal problems). Symptoms associated with infections include: Localized redness, fever, chills, night sweats or profuse sweating, burning sensation when voiding, cough, congestion, stuffiness, runny nose, sore throat, diarrhea, nausea, vomiting, cold or Flu symptoms, recent or current infections. It is specially important if the infection is over the area that we intend to treat. . Heart and lung problems: Symptoms that may suggest an active cardiopulmonary problem include: cough, chest pain, breathing difficulties or shortness of breath, dizziness, ankle swelling, uncontrolled high or unusually low blood pressure, and/or palpitations. If you are experiencing any of these symptoms, cancel your procedure and contact your primary care physician for an evaluation.  Remember:  Regular Business hours are:  Monday to Thursday 8:00 AM to 4:00 PM  Provider's Schedule: Francisco Naveira, MD:  Procedure days: Tuesday and Thursday 7:30 AM to   4:00 PM  Bilal Lateef, MD:  Procedure days: Monday and Wednesday 7:30 AM to 4:00  PM ____________________________________________________________________________________________    

## 2018-05-31 NOTE — Telephone Encounter (Signed)
Pts husband called and stated that [facility] would not let them fill her Hydrocodone because it was too early but pt states that Dr. Holley Raring said he could fill it early

## 2018-05-31 NOTE — Progress Notes (Signed)
Patient's Name: Pamela Romero  MRN: [id number]  Referring Provider: Ronnell Freshwater, NP  DOB: Sep 19, 1950  PCP: Ronnell Freshwater, NP  DOS: 05/31/2018  Note by: Gillis Santa, MD  Service setting: Ambulatory outpatient  Specialty: Interventional Pain Management  Location: [facility] (AMB) Pain Management Facility    Patient type: Established   Primary Reason(s) for Visit: Encounter for prescription drug management & post-procedure evaluation of chronic illness with mild to moderate exacerbation(Level of risk: moderate) TK:PTWS knee pain  HPI  Pamela Romero is a 68 y.o. year old, female patient, who comes today for a post-procedure evaluation and medication management. 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; Hyperlipidemia LDL goal <100; Transient vision disturbance, bilateral; Unspecified constipation; Cough; Dysphagia; Obesity, unspecified; Annual physical exam; S/P breast biopsy; Pes planus of both feet; Shortness of breath; Cellulitis of left lower limb; Edema, unspecified; Inflammatory polyarthropathy ([place]); Major depressive disorder, recurrent, mild ([place]); Chronic kidney disease, unspecified; Anxiety disorder, unspecified; Nicotine dependence, cigarettes, uncomplicated; Chronic pain of left knee; Plantar fascia syndrome; Acute left-sided low back pain without sciatica; Bilateral primary osteoarthritis of knee; Chronic pain of right ankle; and Primary osteoarthritis of right ankle on their problem list. Her primarily concern today is the No chief complaint on file.  Pain Assessment: Location: Right, Left Knee Radiating: Pain starts at the thigh bilateral and down to feet bilateral  Onset: More than a month ago Duration: Chronic pain Quality: Aching, Cramping, Sharp, Constant, Stabbing Severity: 7 /10 (subjective, self-reported pain score)  Note:  Reported level is inconsistent with clinical observations.                         When using our objective Pain Scale, levels between 6 and 10/10 are said to belong in an emergency room, as it progressively worsens from a 6/10, described as severely limiting, requiring emergency care not usually available at an outpatient pain management facility. At a 6/10 level, communication becomes difficult and requires great effort. Assistance to reach the emergency department may be required. Facial flushing and profuse sweating along with potentially dangerous increases in heart rate and blood pressure will be evident. Effect on ADL: "It hards to walk so much escpeically with work"  Timing: Constant Modifying factors: Medication, procedure and rest  BP: (!) 151/107  HR: 79  [name] was last seen on 04/17/2018 for a procedure. During today's appointment we reviewed Pamela Romero's post-procedure results, as well as her outpatient medication regimen.  Further details on both, my assessment(s), as well as the proposed treatment plan, please see below.  Controlled Substance Pharmacotherapy Assessment REMS (Risk Evaluation and Mitigation Strategy)  Analgesic: Hydrocodone 7.5 mg 3 times daily PRN MME/day: 22.5 mg/day.  [name], RN  05/31/2018  8:40 AM  Sign when Signing Visit Nursing Pain Medication Assessment:  Safety precautions to be maintained throughout the outpatient stay will include: orient to surroundings, keep bed in low position, maintain call bell within reach at all times, provide assistance with transfer out of bed and ambulation.  Medication Inspection Compliance: Pill count conducted under aseptic conditions, in front of the patient. Neither the pills nor the bottle was removed from the patient's sight at any time. Once count was completed pills were immediately returned to the patient in their original bottle.  Medication: Hydrocodone/APAP Pill/Patch Count: 0 of 90 pills  remain Pill/Patch Appearance: Markings consistent with prescribed medication Bottle  Appearance: Standard pharmacy container. Clearly labeled. Filled Date: 01 / 30 / 2019 Last Medication intake:  Today   Pharmacokinetics: Liberation and absorption (onset of action): WNL Distribution (time to peak effect): WNL Metabolism and excretion (duration of action): WNL         Pharmacodynamics: Desired effects: Analgesia: Pamela Romero reports >50% benefit. Functional ability: Patient reports that medication allows her to accomplish basic ADLs Clinically meaningful improvement in function (CMIF): Sustained CMIF goals met Perceived effectiveness: Described as relatively effective but with some room for improvement Undesirable effects: Side-effects or Adverse reactions: None reported Monitoring: [place] PMP: Online review of the past 42-monthperiod conducted. Compliant with practice rules and regulations Last UDS on record: Summary  Date Value Ref Range Status  08/30/2017 FINAL  Final    Comment:    ==================================================================== TOXAS[facility]RE COMP DRUG ANALYSIS,UR ==================================================================== Test                             Result       Flag       Units Drug Present and Declared for Prescription Verification   Tramadol                       >1479        EXPECTED   ng/mg creat   O-Desmethyltramadol            >1479        EXPECTED   ng/mg creat   N-Desmethyltramadol            >1479        EXPECTED   ng/mg creat    Source of tramadol is a prescription medication.    O-desmethyltramadol and N-desmethyltramadol are expected    metabolites of tramadol.   Diclofenac                     PRESENT      EXPECTED Drug Present not Declared for Prescription Verification   Acetaminophen                  PRESENT      UNEXPECTED Drug Absent but Declared for Prescription Verification   Duloxetine                     Not Detected  UNEXPECTED ==================================================================== Test                      Result    Flag   Units      Ref Range   Creatinine              338              mg/dL      >=20 ==================================================================== Declared Medications:  The flagging and interpretation on this report are based on the  following declared medications.  Unexpected results may arise from  inaccuracies in the declared medications.  **Note: The testing scope of this panel includes these medications:  Duloxetine  Tramadol  **Note: The testing scope of this panel does not include small to  moderate amounts of these reported medications:  Diclofenac  **Note: The testing scope of this panel does not include following  reported medications:  Docusate (Colace)  Hydrochlorothiazide (Zestoretic)  Levothyroxine (Synthroid)  Lisinopril (Zestoretic)  Omeprazole (Nexium)  Ranitidine  Rosuvastatin ==================================================================== For clinical consultation, please call ([phone] ====================================================================  UDS interpretation: Compliant          Medication Assessment Form: Reviewed. Patient indicates being compliant with therapy Treatment compliance: Compliant Risk Assessment Profile: Aberrant behavior: See initial evaluations. None observed or detected today Comorbid factors increasing risk of overdose: See initial evaluation. No additional risks detected today Opioid risk tool (ORT):  Opioid Risk  05/31/2018  Alcohol 3  Illegal Drugs 0  Rx Drugs 0  Alcohol 3  Illegal Drugs 0  Rx Drugs 0  Age between 16-45 years  0  History of Preadolescent Sexual Abuse 0  Psychological Disease 0  Depression 0  Opioid Risk Tool Scoring 6  Opioid Risk Interpretation Moderate Risk    ORT Scoring interpretation table:  Score <3 = Low Risk for SUD  Score between 4-7 = Moderate Risk  for SUD  Score >8 = High Risk for Opioid Abuse   Risk of substance use disorder (SUD): Low  Risk Mitigation Strategies:  Patient Counseling: Covered Patient-Prescriber Agreement (PPA): Present and active  Notification to other healthcare providers: Done  Pharmacologic Plan: Increase in monthly quantity from #90-->105 so that patient can occasionally take fourth tablet a day when she is working 7 days shifts.             Post-Procedure Assessment  04/16/2018 Procedure: Left genicular nerve RFA 04/16/2018; right genicular nerve RFA 03/12/2018  Influential Factors: BMI: 31.47 kg/m Intra-procedural challenges: None observed.         Assessment challenges: None detected.              Reported side-effects: None.        Post-procedural adverse reactions or complications: None reported         Sedation: Please see nurses note. When no sedatives are used, the analgesic levels obtained are directly associated to the effectiveness of the local anesthetics. However, when sedation is provided, the level of analgesia obtained during the initial 1 hour following the intervention, is believed to be the result of a combination of factors. These factors may include, but are not limited to: 1. The effectiveness of the local anesthetics used. 2. The effects of the analgesic(s) and/or anxiolytic(s) used. 3. The degree of discomfort experienced by the patient at the time of the procedure. 4. The patients ability and reliability in recalling and recording the events. 5. The presence and influence of possible secondary gains and/or psychosocial factors. Reported result: Relief experienced during the 1st hour after the procedure: 100 % (Ultra-Short Term Relief)            Interpretative annotation: Clinically appropriate result. Analgesia during this period is likely to be Local Anesthetic and/or IV Sedative (Analgesic/Anxiolytic) related.          Effects of local anesthetic: The analgesic effects attained  during this period are directly associated to the localized infiltration of local anesthetics and therefore cary significant diagnostic value as to the etiological location, or anatomical origin, of the pain. Expected duration of relief is directly dependent on the pharmacodynamics of the local anesthetic used. Long-acting (4-6 hours) anesthetics used.  Reported result: Relief during the next 4 to 6 hour after the procedure: 100 % (Short-Term Relief)            Interpretative annotation: Clinically appropriate result. Analgesia during this period is likely to be Local Anesthetic-related.          Long-term benefit: Defined as the period of time past the expected duration of local anesthetics (1 hour for short-acting and 4-6  hours for long-acting). With the possible exception of prolonged sympathetic blockade from the local anesthetics, benefits during this period are typically attributed to, or associated with, other factors such as analgesic sensory neuropraxia, antiinflammatory effects, or beneficial biochemical changes provided by agents other than the local anesthetics.  Reported result: Extended relief following procedure: 60 %(Right lknee is 60% improve (except the foot). Left knee is worse. Feels likes its pulling on inner left knee. ) (Long-Term Relief)            Interpretative annotation: Clinically possible results. Good relief. No permanent benefit expected. Inflammation plays a part in the etiology to the pain.          Current benefits: Defined as reported results that persistent at this point in time.   Analgesia: 100% right knee, left knee 60 to 70% with continued pain towards the medial aspect. %            Function: Somewhat improved ROM: Somewhat improved Interpretative annotation: Ongoing benefit. Therapeutic benefit observed. Effective therapeutic approach.          Interpretation: Results would suggest a successful diagnostic and therapeutic intervention.                  Plan:   Please see "Plan of Care" for details.                Laboratory Chemistry  Inflammation Markers (CRP: Acute Phase) (ESR: Chronic Phase) Lab Results  Component Value Date   ESRSEDRATE 19 03/30/2012                         Rheumatology Markers No results found for: RF, ANA, LABURIC, URICUR, LYMEIGGIGMAB, LYMEABIGMQN, HLAB27                      Renal Function Markers Lab Results  Component Value Date   BUN 19 08/29/2017   CREATININE 1.13 (H) 08/29/2017   BCR 17 08/29/2017   GFRAA 59 (L) 08/29/2017   GFRNONAA 51 (L) 08/29/2017                             Hepatic Function Markers Lab Results  Component Value Date   AST 15 08/29/2017   ALT 18 08/29/2017   ALBUMIN 4.4 08/29/2017   ALKPHOS 126 (H) 08/29/2017   HCVAB NEGATIVE 01/21/2015                        Electrolytes Lab Results  Component Value Date   NA 141 08/29/2017   K 3.8 08/29/2017   CL 102 08/29/2017   CALCIUM 9.3 08/29/2017   MG 1.7 08/29/2017                        Neuropathy Markers Lab Results  Component Value Date   VITAMINB12 224 (L) 08/29/2017   HGBA1C 4.8 01/21/2015   HIV NONREACTIVE 01/21/2015                        CNS Tests No results found for: COLORCSF, APPEARCSF, RBCCOUNTCSF, WBCCSF, POLYSCSF, LYMPHSCSF, EOSCSF, PROTEINCSF, GLUCCSF, JCVIRUS, CSFOLI, IGGCSF                      Bone Pathology Markers Lab Results  Component Value Date   VD25OH 26.66 (L) 05/14/2014  Coagulation Parameters Lab Results  Component Value Date   PLT 127.0 (L) 09/16/2013                        Cardiovascular Markers Lab Results  Component Value Date   HGB 12.1 09/16/2013   HCT 36.3 09/16/2013                         CA Markers No results found for: CEA, CA125, LABCA2                      Endocrine Markers Lab Results  Component Value Date   TSH 40.992 (H) 07/14/2017   FREET4 0.77 07/14/2017                        Note: Lab results reviewed.  Recent Diagnostic  Imaging Results  DG Ankle Complete Right CLINICAL DATA:  Bilateral knee osteoarthritis with outward rotation of the feet. Evaluate for underlying ankle abnormality.  EXAM: RIGHT ANKLE - COMPLETE 3+ VIEW  COMPARISON:  Concurrently obtained radiographs of the left ankle  FINDINGS: No evidence of acute fracture, malalignment or ankle joint effusion. No degenerative changes present within the tibiotalar joint. There is degenerative osteoarthritis at the talonavicular, calcaneonavicular and talonavicular joints. Further degenerative changes are present in the midfoot within the intertarsal joints. No evidence of osseous lesion.  IMPRESSION: Degenerative osteoarthritis in the hindfoot and midfoot.  Electronically Signed   By: Jacqulynn Cadet M.D.   On: 05/22/2018 09:18 DG Ankle Complete Left CLINICAL DATA:  68 year old female with knee osteoarthritis and outward rotation of the feet. Evaluate for underlying ankle abnormality.  EXAM: LEFT ANKLE COMPLETE - 3+ VIEW  COMPARISON:  Concurrently obtained radiographs of the right ankle  FINDINGS: No evidence of acute fracture or malalignment. No ankle joint effusion or focal degenerative changes in the ankle itself. No evidence of bony lesion. There is degenerative osteoarthritis in the midfoot in the intertarsal joints.  IMPRESSION: Midfoot degenerative osteoarthritis within the intertarsal joints.  Electronically Signed   By: Jacqulynn Cadet M.D.   On: 05/22/2018 09:16  Complexity Note: Imaging results reviewed. Results shared with Ms. Cerino, using Layman's terms.                         Meds   Current Outpatient Medications:  .  esomeprazole (NEXIUM) 40 MG capsule, Take 1 capsule (40 mg total) by mouth daily., Disp: 30 capsule, Rfl: 3 .  HYDROcodone-acetaminophen (NORCO) 7.5-325 MG tablet, Take 1 tablet by mouth every 6 (six) hours as needed for up to 30 days for moderate pain., Disp: 105 tablet, Rfl: 0 .   levothyroxine (SYNTHROID, LEVOTHROID) 125 MCG tablet, Take two tablets by mouth daily, Disp: 60 tablet, Rfl: 3 .  lisinopril (PRINIVIL,ZESTRIL) 10 MG tablet, Take 1 tablet (10 mg total) by mouth daily., Disp: 30 tablet, Rfl: 3 .  docusate sodium (COLACE) 100 MG capsule, Take 100 mg by mouth 2 (two) times daily as needed., Disp: , Rfl:  .  DULoxetine (CYMBALTA) 20 MG capsule, Take 20 mg by mouth daily., Disp: , Rfl:  .  [START ON 06/30/2018] HYDROcodone-acetaminophen (NORCO) 7.5-325 MG tablet, Take 1 tablet by mouth every 6 (six) hours as needed for up to 30 days for moderate pain., Disp: 105 tablet, Rfl: 0  ROS  Constitutional: Denies any fever or chills Gastrointestinal: No reported hemesis,  hematochezia, vomiting, or acute GI distress Musculoskeletal: Denies any acute onset joint swelling, redness, loss of ROM, or weakness Neurological: No reported episodes of acute onset apraxia, aphasia, dysarthria, agnosia, amnesia, paralysis, loss of coordination, or loss of consciousness  Allergies  Ms. Napolitano has No Known Allergies.  PFSH  Drug: Ms. Northrup  reports no history of drug use. Alcohol:  reports no history of alcohol use. Tobacco:  reports that she has been smoking. She has never used smokeless tobacco. Medical:  has a past medical history of Abnormal EKG, CAD (coronary artery disease), Cancer ([place]), Endometrial adenocarcinoma ([place]) (2011), Hyperlipidemia, Hypertension, Osteoarthritis, Reflux gastritis, Thyroid disease, tobacco abuse, and Vaginal delivery. Surgical: Ms. Barcia  has a past surgical history that includes Thyroidectomy (2005); Cardiac catheterization (2006); Total abdominal hysterectomy; Breast surgery (Right, March 2014); and Breast cyst aspiration (Right). Family: family history includes Alcohol abuse in her father, maternal aunt, and mother; Cancer in her brother, brother, and maternal aunt; Heart disease in her brother.  Constitutional Exam  General appearance: Well  nourished, well developed, and well hydrated. In no apparent acute distress Vitals:   05/31/18 0830  BP: (!) 151/107  Pulse: 79  Resp: 18  Temp: 98.2 F (36.8 C)  SpO2: 97%  Weight: 207 lb (93.9 kg)  Height: '5\' 8"'$  (1.727 m)   BMI Assessment: Estimated body mass index is 31.47 kg/m as calculated from the following:   Height as of this encounter: '5\' 8"'$  (1.727 m).   Weight as of this encounter: 207 lb (93.9 kg).  BMI interpretation table: BMI level Category Range association with higher incidence of chronic pain  <18 kg/m2 Underweight   18.5-24.9 kg/m2 Ideal body weight   25-29.9 kg/m2 Overweight Increased incidence by 20%  30-34.9 kg/m2 Obese (Class I) Increased incidence by 68%  35-39.9 kg/m2 Severe obesity (Class II) Increased incidence by 136%  >40 kg/m2 Extreme obesity (Class III) Increased incidence by 254%   Patient's current BMI Ideal Body weight  Body mass index is 31.47 kg/m. Ideal body weight: 63.9 kg (140 lb 14 oz) Adjusted ideal body weight: 75.9 kg (167 lb 5.2 oz)   BMI Readings from Last 4 Encounters:  05/31/18 31.47 kg/m  04/16/18 31.17 kg/m  03/12/18 30.72 kg/m  02/15/18 31.17 kg/m   Wt Readings from Last 4 Encounters:  05/31/18 207 lb (93.9 kg)  04/16/18 205 lb (93 kg)  03/12/18 205 lb (93 kg)  02/15/18 205 lb (93 kg)  Psych/Mental status: Alert, oriented x 3 (person, place, & time)       Eyes: PERLA Respiratory: No evidence of acute respiratory distress  Cervical Spine Area Exam  Skin & Axial Inspection: No masses, redness, edema, swelling, or associated skin lesions Alignment: Symmetrical Functional ROM: Unrestricted ROM      Stability: No instability detected Muscle Tone/Strength: Functionally intact. No obvious neuro-muscular anomalies detected. Sensory (Neurological): Unimpaired Palpation: No palpable anomalies              Upper Extremity (UE) Exam    Side: Right upper extremity  Side: Left upper extremity  Skin & Extremity Inspection:  Skin color, temperature, and hair growth are WNL. No peripheral edema or cyanosis. No masses, redness, swelling, asymmetry, or associated skin lesions. No contractures.  Skin & Extremity Inspection: Skin color, temperature, and hair growth are WNL. No peripheral edema or cyanosis. No masses, redness, swelling, asymmetry, or associated skin lesions. No contractures.  Functional ROM: Unrestricted ROM          Functional ROM:  Unrestricted ROM          Muscle Tone/Strength: Functionally intact. No obvious neuro-muscular anomalies detected.  Muscle Tone/Strength: Functionally intact. No obvious neuro-muscular anomalies detected.  Sensory (Neurological): Unimpaired          Sensory (Neurological): Unimpaired          Palpation: No palpable anomalies              Palpation: No palpable anomalies              Provocative Test(s):  Phalen's test: deferred Tinel's test: deferred Apley's scratch test (touch opposite shoulder):  Action 1 (Across chest): deferred Action 2 (Overhead): deferred Action 3 (LB reach): deferred   Provocative Test(s):  Phalen's test: deferred Tinel's test: deferred Apley's scratch test (touch opposite shoulder):  Action 1 (Across chest): deferred Action 2 (Overhead): deferred Action 3 (LB reach): deferred    Thoracic Spine Area Exam  Skin & Axial Inspection: No masses, redness, or swelling Alignment: Symmetrical Functional ROM: Unrestricted ROM Stability: No instability detected Muscle Tone/Strength: Functionally intact. No obvious neuro-muscular anomalies detected. Sensory (Neurological): Unimpaired Muscle strength & Tone: No palpable anomalies  Lumbar Spine Area Exam  Skin & Axial Inspection: No masses, redness, or swelling Alignment: Symmetrical Functional ROM: Unrestricted ROM       Stability: No instability detected Muscle Tone/Strength: Functionally intact. No obvious neuro-muscular anomalies detected. Sensory (Neurological): Unimpaired Palpation: No palpable  anomalies       Provocative Tests: Hyperextension/rotation test: deferred today       Lumbar quadrant test (Kemp's test): deferred today       Lateral bending test: deferred today       Patrick's Maneuver: deferred today                   FABER* test: deferred today                   S-I anterior distraction/compression test: deferred today         S-I lateral compression test: deferred today         S-I Thigh-thrust test: deferred today         S-I Gaenslen's test: deferred today         *(Flexion, ABduction and External Rotation)  Gait & Posture Assessment  Ambulation: Unassisted Gait: Relatively normal for age and body habitus Posture: WNL   Lower Extremity Exam    Side: Right lower extremity  Side: Left lower extremity  Stability: No instability observed          Stability: No instability observed          Skin & Extremity Inspection: Skin color, temperature, and hair growth are WNL. No peripheral edema or cyanosis. No masses, redness, swelling, asymmetry, or associated skin lesions. No contractures.  Skin & Extremity Inspection: Skin color, temperature, and hair growth are WNL. No peripheral edema or cyanosis. No masses, redness, swelling, asymmetry, or associated skin lesions. No contractures.  Functional ROM: Improved after treatment                  Functional ROM: Unrestricted ROM                  Muscle Tone/Strength: Functionally intact. No obvious neuro-muscular anomalies detected.  Muscle Tone/Strength: Functionally intact. No obvious neuro-muscular anomalies detected.  Sensory (Neurological): Improved        Sensory (Neurological): Arthropathic arthralgia more pronounced towards the medial aspect  DTR: Patellar: deferred today Achilles: deferred today Plantar: deferred today  DTR: Patellar: deferred today Achilles: deferred today Plantar: deferred today  Palpation: No palpable anomalies  Palpation: No palpable anomalies   Assessment   Status Diagnosis   Controlled Controlled Controlled 1. Primary osteoarthritis of left knee   2. Chronic pain syndrome   3. Bilateral primary osteoarthritis of knee   4. Pharmacologic therapy      68 year old female with a history of bilateral knee osteoarthritis who follows up status post left and right genicular nerve radiofrequency ablation.  Patient endorses significant pain relief and improvement in function of her right knee.  She also describes improvement in pain of her left knee but continues to endorse persistent medial knee pain that is worse with weightbearing.  We also reviewed the patient's ankle x-rays which reveal degenerative changes.  In regards to the medial aspect of her knee, we discussed intra-articular steroid injection of her left knee.  Risks and benefits were discussed and patient would like to proceed.  Given that the patient is having worsening medial knee pain that is worse with weightbearing, patient is requesting to increase her monthly quantity to 4 times a day.  I will increase her monthly quantity from 90-1 05 so that the patient can take an extra tablet on some days when she has having a pain flare.  Patient endorsed understanding.  No further dose escalation beyond this.  Plan: -Status post bilateral genicular nerve RFA.  Can consider repeating after 6 months -Left intra-articular steroid injection for knee osteoarthritis -Prescription for hydrocodone at increase monthly quantity of #105.  No further dose escalation beyond this.  Plan of Care  Pharmacotherapy (Medications Ordered): Meds ordered this encounter  Medications  . HYDROcodone-acetaminophen (NORCO) 7.5-325 MG tablet    Sig: Take 1 tablet by mouth every 6 (six) hours as needed for up to 30 days for moderate pain.    Dispense:  105 tablet    Refill:  0    Do not place this medication, or any other prescription from our practice, on "Automatic Refill". Patient may have prescription filled one day early if pharmacy  is closed on scheduled refill date.  [name] HYDROcodone-acetaminophen (NORCO) 7.5-325 MG tablet    Sig: Take 1 tablet by mouth every 6 (six) hours as needed for up to 30 days for moderate pain.    Dispense:  105 tablet    Refill:  0    Do not place this medication, or any other prescription from our practice, on "Automatic Refill". Patient may have prescription filled one day early if pharmacy is closed on scheduled refill date. :   Lab-work, procedure(s), and/or referral(s): Orders Placed This Encounter  Procedures  . KNEE INJECTION  . Drug Screen 10 W/Conf, Serum    Time Note: Greater than 50% of the 25 minute(s) of face-to-face time spent with Ms. Thomaston, was spent in counseling/coordination of care regarding: the appropriate use of the pain scale, Ms. Tignor's primary cause of pain, the results of her recent test(s), the treatment plan, treatment alternatives, the risks and possible complications of proposed treatment, medication side effects, going over the informed consent, the opioid analgesic risks and possible complications, the results, interpretation and significance of  her recent diagnostic interventional treatment(s), the appropriate use of her medications, realistic expectations, the goals of pain management (increased in functionality), the medication agreement and the patient's responsibilities when it comes to controlled substances. Future Appointments  Date Time Provider [place]  06/04/2018 10:20  AM Mikey College, NP [facility] None  06/18/2018  9:45 AM Gillis Santa, MD [facility] None  07/31/2018  8:45 AM Gillis Santa, MD [facility] None    Primary Care Physician: Ronnell Freshwater, NP Location: [facility] Note by: Gillis Santa, M.D Date: 05/31/2018; Time: 11:10 AM  Patient Instructions    ____________________________________________________________________________________________  Preparing for your procedure (without  sedation)  Instructions: . Oral Intake: Do not eat or drink anything for at least 3 hours prior to your procedure. . Transportation: Unless otherwise stated by your physician, you may drive yourself after the procedure. . Blood Pressure Medicine: Take your blood pressure medicine with a sip of water the morning of the procedure. . Blood thinners: Notify our staff if you are taking any blood thinners. Depending on which one you take, there will be specific instructions on how and when to stop it. . Diabetics on insulin: Notify the staff so that you can be scheduled 1st case in the morning. If your diabetes requires high dose insulin, take only  of your normal insulin dose the morning of the procedure and notify the staff that you have done so. . Preventing infections: Shower with an antibacterial soap the morning of your procedure.  . Build-up your immune system: Take 1000 mg of Vitamin C with every meal (3 times a day) the day prior to your procedure. [name] Antibiotics: Inform the staff if you have a condition or reason that requires you to take antibiotics before dental procedures. . Pregnancy: If you are pregnant, call and cancel the procedure. . Sickness: If you have a cold, fever, or any active infections, call and cancel the procedure. . Arrival: You must be in the facility at least 30 minutes prior to your scheduled procedure. . Children: Do not bring any children with you. . Dress appropriately: Bring dark clothing that you would not mind if they get stained. . Valuables: Do not bring any jewelry or valuables.  Procedure appointments are reserved for interventional treatments only. [name] No Prescription Refills. . No medication changes will be discussed during procedure appointments. . No disability issues will be discussed.  Reasons to call and reschedule or cancel your procedure: (Following these recommendations will minimize the risk of a serious complication.) . Surgeries: Avoid having  procedures within 2 weeks of any surgery. (Avoid for 2 weeks before or after any surgery). . Flu Shots: Avoid having procedures within 2 weeks of a flu shots or . (Avoid for 2 weeks before or after immunizations). . Barium: Avoid having a procedure within 7-10 days after having had a radiological study involving the use of radiological contrast. (Myelograms, Barium swallow or enema study). . Heart attacks: Avoid any elective procedures or surgeries for the initial 6 months after a "Myocardial Infarction" (Heart Attack). . Blood thinners: It is imperative that you stop these medications before procedures. Let us know if you if you take any blood thinner.  . Infection: Avoid procedures during or within two weeks of an infection (including chest colds or gastrointestinal problems). Symptoms associated with infections include: Localized redness, fever, chills, night sweats or profuse sweating, burning sensation when voiding, cough, congestion, stuffiness, runny nose, sore throat, diarrhea, nausea, vomiting, cold or Flu symptoms, recent or current infections. It is specially important if the infection is over the area that we intend to treat. [name] Heart and lung problems: Symptoms that may suggest an active cardiopulmonary problem include: cough, chest pain, breathing difficulties or shortness of breath, dizziness, ankle swelling,  uncontrolled high or unusually low blood pressure, and/or palpitations. If you are experiencing any of these symptoms, cancel your procedure and contact your primary care physician for an evaluation.  Remember:  Regular Business hours are:  Monday to Thursday 8:00 AM to 4:00 PM  Provider's Schedule: Milinda Pointer, MD:  Procedure days: Tuesday and Thursday 7:30 AM to 4:00 PM  Gillis Santa, MD:  Procedure days: Monday and Wednesday 7:30 AM to 4:00 PM ____________________________________________________________________________________________

## 2018-05-31 NOTE — Telephone Encounter (Signed)
Spoke with pharmacist and they state they could fill it. Notified patients husband,

## 2018-06-04 ENCOUNTER — Ambulatory Visit (INDEPENDENT_AMBULATORY_CARE_PROVIDER_SITE_OTHER): Payer: BLUE CROSS/BLUE SHIELD | Admitting: Nurse Practitioner

## 2018-06-04 ENCOUNTER — Encounter: Payer: Self-pay | Admitting: Nurse Practitioner

## 2018-06-04 VITALS — BP 139/95 | HR 88 | Temp 98.2°F | Ht 68.0 in | Wt 232.6 lb

## 2018-06-04 DIAGNOSIS — Z7689 Persons encountering health services in other specified circumstances: Secondary | ICD-10-CM

## 2018-06-04 DIAGNOSIS — K2100 Gastro-esophageal reflux disease with esophagitis, without bleeding: Secondary | ICD-10-CM

## 2018-06-04 DIAGNOSIS — I10 Essential (primary) hypertension: Secondary | ICD-10-CM

## 2018-06-04 DIAGNOSIS — E890 Postprocedural hypothyroidism: Secondary | ICD-10-CM

## 2018-06-04 DIAGNOSIS — R252 Cramp and spasm: Secondary | ICD-10-CM

## 2018-06-04 MED ORDER — LISINOPRIL 20 MG PO TABS: 20.0000 mg | ORAL_TABLET | Freq: Every day | ORAL | 3 refills | Status: DC

## 2018-06-04 MED ORDER — LISINOPRIL 10 MG PO TABS: 10.0000 mg | ORAL_TABLET | Freq: Every day | ORAL | 1 refills | Status: DC

## 2018-06-04 NOTE — Progress Notes (Signed)
Subjective:    Patient ID: Pamela Romero, female    DOB: 17-Aug-1950, 68 y.o.   MRN: [id number]  Pamela Romero is a 68 y.o. female presenting on 06/04/2018 for Establish Care (muscle cramps arms, back and sides.  pt concern it could be related to thyroid disease. Need blood pressure managed.)   HPI Establish Care New Provider Pt last seen by PCP about 3 years ago.  Obtain records from [facility] for recent specialist care.  Patient has been seen recently for thyroid management and pain medicine.    Thyroid disease Patient feels like levels are not at goals.  Patient was having dizziness at levothyroxine at 300 mcg.  Has reduced to 250 then 125. - Patient is having severe cramps in arms, hands, legs, toes, back and sides, anterior left shoulder.  - Patient occasionally has trouble getting breath in. - Bleeding easily. - Patient works [employer] 3rd shift - Patient has been on 250 mcg for 2-3 weeks.    Hypertension - She is not checking BP at home or outside of clinic.    - Current medications: lisinopril 10 mg once daily prescribed but NOT taking.  Previously was tolerating well without side effects - She is not currently symptomatic. - Pt denies headache, lightheadedness, dizziness, changes in vision, chest tightness/pressure, palpitations, leg swelling, sudden loss of speech or loss of consciousness.  Past Medical History:  Diagnosis Date  . Abnormal EKG   . CAD (coronary artery disease)    non critical, by cardiac cath 2006  . Cancer ([place])    endometrial Ca  . Endometrial adenocarcinoma ([place]) 2011  . Hyperlipidemia   . Hypertension   . Osteoarthritis   . Reflux gastritis   . Thyroid disease   . tobacco abuse   . Vaginal delivery    x 2   Past Surgical History:  Procedure Laterality Date  . BREAST CYST ASPIRATION Right    neg  . BREAST SURGERY Right March 2014   benign [place]  . CARDIAC CATHETERIZATION  2006   40% LAD, EF 60%  . THYROIDECTOMY  2005   partial at [facility], non  malignant tumor  . TOTAL ABDOMINAL HYSTERECTOMY     with BSO   Social History   Socioeconomic History  . Marital status: Married    Spouse name: Not on file  . Number of children: 0  . Years of education: Not on file  . Highest education level: Not on file  Occupational History  . Occupation: Public affairs consultant- full time  . Occupation: Administrator, arts- part time  Social Needs  . Financial resource strain: Not on file  . Food insecurity:    Worry: Not on file    Inability: Not on file  . Transportation needs:    Medical: Not on file    Non-medical: Not on file  Tobacco Use  . Smoking status: Current Every Day Smoker  . Smokeless tobacco: Never Used  . Tobacco comment: 2-3 cigs a day  Substance and Sexual Activity  . Alcohol use: No  . Drug use: No  . Sexual activity: Not on file  Lifestyle  . Physical activity:    Days per week: Not on file    Minutes per session: Not on file  . Stress: Not on file  Relationships  . Social connections:    Talks on phone: Not on file    Gets together: Not on file    Attends religious service: Not on file    Active  member of club or organization: Not on file    Attends meetings of clubs or organizations: Not on file    Relationship status: Not on file  . Intimate partner violence:    Fear of current or ex partner: Not on file    Emotionally abused: Not on file    Physically abused: Not on file    Forced sexual activity: Not on file  Other Topics Concern  . Not on file  Social History Narrative   Lives with spouse, takes care of her mother.   Had 2 children, one died at birth, the other given up for adoption (was unmarried).   Family History  Problem Relation Age of Onset  . Alcohol abuse Father   . Cancer Maternal Aunt        stomach  . Alcohol abuse Maternal Aunt   . Alcohol abuse Mother   . Heart disease Brother        MI age 49- non smoker  . Cancer Brother        colon, dx'd age 63  . Cancer Brother        stomach, paternal  half brother  . Breast cancer Neg Hx    Current Outpatient Medications on File Prior to Visit  Medication Sig  . docusate sodium (COLACE) 100 MG capsule Take 100 mg by mouth 2 (two) times daily as needed.  [name] esomeprazole (NEXIUM) 40 MG capsule Take 1 capsule (40 mg total) by mouth daily.  [name] ON 06/30/2018] HYDROcodone-acetaminophen (NORCO) 7.5-325 MG tablet Take 1 tablet by mouth every 6 (six) hours as needed for up to 30 days for moderate pain.  [name] levothyroxine (SYNTHROID, LEVOTHROID) 125 MCG tablet Take two tablets by mouth daily  . lisinopril (PRINIVIL,ZESTRIL) 10 MG tablet Take 1 tablet (10 mg total) by mouth daily.  [name] HYDROcodone-acetaminophen (NORCO) 7.5-325 MG tablet Take 1 tablet by mouth every 6 (six) hours as needed for up to 30 days for moderate pain. (Patient not taking: Reported on 06/04/2018)   No current facility-administered medications on file prior to visit.     Review of Systems  Constitutional: Positive for fatigue. Negative for activity change and appetite change.  Respiratory: Negative for cough and shortness of breath.   Cardiovascular: Negative for chest pain, palpitations and leg swelling.  Gastrointestinal: Negative for constipation, diarrhea, nausea and vomiting.  Genitourinary: Negative for dysuria, frequency and urgency.  Musculoskeletal: Positive for myalgias. Negative for arthralgias.  Skin: Negative for rash.  Neurological: Positive for dizziness. Negative for headaches.  Psychiatric/Behavioral: Negative for dysphoric mood. The patient is not nervous/anxious.    Per HPI unless specifically indicated above     Objective:    BP (!) 139/95 (BP Location: Right Arm, Patient Position: Sitting, Cuff Size: Large)   Pulse 88   Temp 98.2 F (36.8 C) (Oral)   Ht 5\' 8"  (1.727 m)   Wt 232 lb 9.6 oz (105.5 kg)   SpO2 99%   BMI 35.37 kg/m   Wt Readings from Last 3 Encounters:  06/04/18 232 lb 9.6 oz (105.5 kg)  05/31/18 207 lb (93.9 kg)  04/16/18 205 lb  (93 kg)    Physical Exam Vitals signs reviewed.  Constitutional:      General: She is not in acute distress.    Appearance: She is well-developed.  HENT:     Head: Normocephalic and atraumatic.  Neck:     Musculoskeletal: Full passive range of motion without pain, normal range of motion and neck  supple.     Thyroid: No thyromegaly.  Cardiovascular:     Rate and Rhythm: Normal rate and regular rhythm.     Pulses:          Radial pulses are 2+ on the right side and 2+ on the left side.       Posterior tibial pulses are 1+ on the right side and 1+ on the left side.     Heart sounds: Normal heart sounds, S1 normal and S2 normal.  Pulmonary:     Effort: Pulmonary effort is normal. No respiratory distress.     Breath sounds: Normal breath sounds and air entry.  Musculoskeletal:     Right lower leg: No edema.     Left lower leg: No edema.  Skin:    General: Skin is warm and dry.     Capillary Refill: Capillary refill takes less than 2 seconds.  Neurological:     Mental Status: She is alert and oriented to person, place, and time.  Psychiatric:        Attention and Perception: Attention normal.        Mood and Affect: Mood and affect normal.        Behavior: Behavior normal. Behavior is cooperative.    Results for orders placed or performed in visit on 06/04/18  COMPLETE METABOLIC PANEL WITH GFR  Result Value Ref Range   Glucose, Bld 73 65 - 99 mg/dL   BUN 19 7 - 25 mg/dL   Creat 1.12 (H) 0.50 - 0.99 mg/dL   GFR, Est Non African American 52 (L) > OR = 60 mL/min/1.34m2   GFR, Est African American 60 > OR = 60 mL/min/1.15m2   BUN/Creatinine Ratio 17 6 - 22 (calc)   Sodium 143 135 - 146 mmol/L   Potassium 4.1 3.5 - 5.3 mmol/L   Chloride 103 98 - 110 mmol/L   CO2 26 20 - 32 mmol/L   Calcium 9.1 8.6 - 10.4 mg/dL   Total Protein 6.8 6.1 - 8.1 g/dL   Albumin 4.1 3.6 - 5.1 g/dL   Globulin 2.7 1.9 - 3.7 g/dL (calc)   AG Ratio 1.5 1.0 - 2.5 (calc)   Total Bilirubin 0.3 0.2 - 1.2  mg/dL   Alkaline phosphatase (APISO) 76 37 - 153 U/L   AST 19 10 - 35 U/L   ALT 10 6 - 29 U/L  Magnesium  Result Value Ref Range   Magnesium 1.9 1.5 - 2.5 mg/dL  TSH + free T4  Result Value Ref Range   TSH W/REFLEX TO FT4 50.77 (H) 0.40 - 4.50 mIU/L  T4, free  Result Value Ref Range   Free T4 0.7 (L) 0.8 - 1.8 ng/dL      Assessment & Plan:   Problem List Items Addressed This Visit      Cardiovascular and Mediastinum   Essential hypertension - Primary Controlled hypertension.  BP goal < 130/80.  Pt is not currently working on lifestyle modifications.  Taking medications tolerating well without side effects. No current complications.  Plan: 1. Continue taking lisinopril 10 mg once daily 2. Obtain labs today  3. Encouraged heart healthy diet and increasing exercise to 30 minutes most days of the week. 4. Check BP 1-2 x per week at home, keep log, and bring to clinic at next appointment. 5. Follow up 3 months.     Relevant Medications   lisinopril (PRINIVIL,ZESTRIL) 10 MG tablet   Other Relevant Orders   COMPLETE METABOLIC PANEL WITH  GFR (Completed)     Digestive   Esophageal reflux Currently well controlled on esomeprazole 40 mg once daily.  Plan: 1. Continue esomeprazole 40 mg once daily. Side effects discussed. Pt wants to continue med. 2. Avoid diet triggers. Reviewed need to seek care if globus sensation, difficulty swallowing, s/sx of GI bleed. 3. Follow up as needed and in 6 months.      Endocrine   Hypothyroidism Status unknown.  Patient with partial thyroidectomy and post-surgical hypothyroidism. Recheck labs.  Continue meds without changes today.  Refills provided. Followup with meds after labs and in 6 months.   Relevant Medications   levothyroxine (SYNTHROID, LEVOTHROID) 125 MCG tablet   Other Relevant Orders   TSH + free T4 (Completed)    Other Visit Diagnoses    Encounter to establish care     Previous PCP was not recently.  Records are reviewed in [facility].   Past medical, family, and surgical history reviewed w/ pt.     Muscle cramps     Unknown cause of muscle cramps.  No medications currently taking that would cause cramping.  Check labs for electrolytes.  Encourage ongoing stretching and moderate regular physical activity.  Follow-up prn and after labs.   Relevant Orders   COMPLETE METABOLIC PANEL WITH GFR (Completed)   Magnesium (Completed)      Meds ordered this encounter  Medications  . DISCONTD: lisinopril (PRINIVIL,ZESTRIL) 20 MG tablet    Sig: Take 1 tablet (20 mg total) by mouth daily.    Dispense:  90 tablet    Refill:  3    Order Specific Question:   Supervising Provider    Answer:   Olin Hauser [[id number]]  . lisinopril (PRINIVIL,ZESTRIL) 10 MG tablet    Sig: Take 1 tablet (10 mg total) by mouth daily.    Dispense:  90 tablet    Refill:  1    Order Specific Question:   Supervising Provider    Answer:   Olin Hauser [[id number]]  . levothyroxine (SYNTHROID, LEVOTHROID) 125 MCG tablet    Sig: Take two tablets by mouth daily before breakfast with water.    Dispense:  60 tablet    Refill:  3    Order Specific Question:   Supervising Provider    Answer:   Olin Hauser [[id number]]    Follow up plan: Return in about 3 months (around 09/02/2018) for hypertension, thyroid.  Cassell Smiles, DNP, AGPCNP-BC Adult Gerontology Primary Care Nurse Practitioner [place] [facility] 06/04/2018, 10:51 AM

## 2018-06-04 NOTE — Patient Instructions (Addendum)
Pamela Romero,   Thank you for coming in to clinic today.  1. START taking lisinopril to 10 mg once daily.  2. Labs today.    3. We will call with results in about 1-2 days.  Please schedule a follow-up appointment with Cassell Smiles, AGNP. Return in about 3 months (around 09/02/2018) for hypertension, thyroid.  If you have any other questions or concerns, please feel free to call the clinic or send a message through [place]. You may also schedule an earlier appointment if necessary.  You will receive a survey after today's visit either digitally by e-mail or paper by [employer]. Your experiences and feedback matter to [place].  Please respond so we know how we are doing as we provide care for you.   Cassell Smiles, DNP, AGNP-BC Adult Gerontology Nurse Practitioner [place]

## 2018-06-05 ENCOUNTER — Encounter: Payer: Self-pay | Admitting: Nurse Practitioner

## 2018-06-05 LAB — COMPLETE METABOLIC PANEL WITH GFR
AG Ratio: 1.5 (calc) (ref 1.0–2.5)
ALT: 10 U/L (ref 6–29)
AST: 19 U/L (ref 10–35)
Albumin: 4.1 g/dL (ref 3.6–5.1)
Alkaline phosphatase (APISO): 76 U/L (ref 37–153)
BUN/Creatinine Ratio: 17 (calc) (ref 6–22)
BUN: 19 mg/dL (ref 7–25)
CO2: 26 mmol/L (ref 20–32)
Calcium: 9.1 mg/dL (ref 8.6–10.4)
Chloride: 103 mmol/L (ref 98–110)
Creat: 1.12 mg/dL — ABNORMAL HIGH (ref 0.50–0.99)
GFR, Est African American: 60 mL/min/{1.73_m2} (ref >=60)
GFR, Est Non African American: 52 mL/min/{1.73_m2} — ABNORMAL LOW (ref >=60)
Globulin: 2.7 g/dL (calc) (ref 1.9–3.7)
Glucose, Bld: 73 mg/dL (ref 65–99)
Potassium: 4.1 mmol/L (ref 3.5–5.3)
Sodium: 143 mmol/L (ref 135–146)
Total Bilirubin: 0.3 mg/dL (ref 0.2–1.2)
Total Protein: 6.8 g/dL (ref 6.1–8.1)

## 2018-06-05 LAB — MAGNESIUM: Magnesium: 1.9 mg/dL (ref 1.5–2.5)

## 2018-06-05 LAB — TSH+FREE T4: TSH W/REFLEX TO FT4: 50.77 mIU/L — ABNORMAL HIGH (ref 0.40–4.50)

## 2018-06-05 LAB — T4, FREE: Free T4: 0.7 ng/dL — ABNORMAL LOW (ref 0.8–1.8)

## 2018-06-05 MED ORDER — LEVOTHYROXINE SODIUM 125 MCG PO TABS: ORAL_TABLET | ORAL | 3 refills | Status: DC

## 2018-06-09 ENCOUNTER — Encounter: Payer: Self-pay | Admitting: Nurse Practitioner

## 2018-06-10 LAB — OXYCODONES,MS,WB/SP RFX
Oxycocone: NEGATIVE ng/mL
Oxycodones Confirmation: NEGATIVE
Oxymorphone: NEGATIVE ng/mL

## 2018-06-10 LAB — OPIATES,MS,WB/SP RFX
6-Acetylmorphine: NEGATIVE
Codeine: NEGATIVE ng/mL
DIHYDROCODEINE: 2.3 ng/mL
Hydrocodone: 16.7 ng/mL
Hydromorphone: NEGATIVE ng/mL
Morphine: NEGATIVE ng/mL
Opiate Confirmation: POSITIVE

## 2018-06-10 LAB — DRUG SCREEN 10 W/CONF, SERUM
Amphetamines, IA: NEGATIVE ng/mL
Barbiturates, IA: NEGATIVE ug/mL
Benzodiazepines, IA: NEGATIVE ng/mL
Cocaine & Metabolite, IA: NEGATIVE ng/mL
Methadone, IA: NEGATIVE ng/mL
Opiates, IA: POSITIVE ng/mL — AB
Oxycodones, IA: NEGATIVE ng/mL
Phencyclidine, IA: NEGATIVE ng/mL
Propoxyphene, IA: NEGATIVE ng/mL
THC(MARIJUANA) METABOLITE, IA: NEGATIVE ng/mL

## 2018-06-18 ENCOUNTER — Ambulatory Visit: Payer: BLUE CROSS/BLUE SHIELD | Admitting: Student in an Organized Health Care Education/Training Program

## 2018-06-27 ENCOUNTER — Other Ambulatory Visit: Payer: Self-pay

## 2018-06-27 ENCOUNTER — Encounter: Payer: Self-pay | Admitting: Student in an Organized Health Care Education/Training Program

## 2018-06-27 ENCOUNTER — Ambulatory Visit
Payer: BLUE CROSS/BLUE SHIELD | Attending: Student in an Organized Health Care Education/Training Program | Admitting: Student in an Organized Health Care Education/Training Program

## 2018-06-27 ENCOUNTER — Telehealth: Payer: Self-pay | Admitting: Nurse Practitioner

## 2018-06-27 VITALS — BP 159/102 | HR 86 | Temp 98.4°F | Resp 18 | Ht 68.5 in | Wt 210.0 lb

## 2018-06-27 DIAGNOSIS — M1712 Unilateral primary osteoarthritis, left knee: Secondary | ICD-10-CM | POA: Diagnosis present

## 2018-06-27 MED ORDER — LIDOCAINE HCL 2 % IJ SOLN
20.0000 mL | Freq: Once | INTRAMUSCULAR | Status: AC
  Administered 2018-06-27: 400 mg
  Filled 2018-06-27: qty 20

## 2018-06-27 MED ORDER — ROPIVACAINE HCL 2 MG/ML IJ SOLN
4.0000 mL | Freq: Once | INTRAMUSCULAR | Status: AC
  Administered 2018-06-27: 4 mL via INTRA_ARTICULAR
  Filled 2018-06-27: qty 10

## 2018-06-27 MED ORDER — DEXAMETHASONE SODIUM PHOSPHATE 10 MG/ML IJ SOLN
10.0000 mg | Freq: Once | INTRAMUSCULAR | Status: AC
  Administered 2018-06-27: 10 mg
  Filled 2018-06-27: qty 1

## 2018-06-27 NOTE — Telephone Encounter (Signed)
The pt called requesting a letter stating she can return to work. She was sick 2 weeks ago with possible flu that she treated with OTC medication. She had fever, chills, bodyaches and coughing during that time.  She treated her symptoms with  otc medications and stayed out of work for a week. Her symptoms improved and she returned to work. The pt state now her job is requesting a doctors note from everyone who has been sick within the last 2 weeks, because of an employee who state he was exposed to the [place] and quarantined.  She needs the doctors note to state she is not sick and safe to return to work. She said she didn't come in the office during the time she was sick because she felt like it was the flu and didn't want to expose Lauren to the symptoms, because she knew she was pregnant.The pt was seen today at [facility] and her vitals was taken and documented. She denies any symptoms. She states all her symptoms subsided over 1.5 weeks ago. Please advise

## 2018-06-27 NOTE — Telephone Encounter (Signed)
The pt was notified. She verbalize understanding, no questions or concerns. 

## 2018-06-27 NOTE — Progress Notes (Signed)
Patient's Name: Pamela Romero  MRN: [id number]  Referring Provider: Ronnell Freshwater, NP  DOB: Dec 11, 1950  PCP: Mikey College, NP  DOS: 06/27/2018  Note by: Gillis Santa, MD  Service setting: Ambulatory outpatient  Specialty: Interventional Pain Management  Patient type: Established  Location: [facility] (AMB) Pain Management Facility  Visit type: Interventional Procedure   Primary Reason for Visit: Interventional Pain Management Treatment. CC: Procedure (Steriod injection ) and Knee Pain  Procedure:          Anesthesia, Analgesia, Anxiolysis:  Type: Diagnostic Intra-Articular Local anesthetic and steroid Knee Injection #1  Region: Lateral infrapatellar Knee Region Level: Knee Joint Laterality: Left knee  Type: Local Anesthesia Indication(s): Analgesia         Local Anesthetic: Lidocaine 1-2% Route: Infiltration ([place]/IM) IV Access: Declined Sedation: Declined   Position: Sitting   Indications: 1. Primary osteoarthritis of left knee    Pain Score: Pre-procedure: 10-Worst pain ever/10 Post-procedure: 0-No pain/10  Pre-op Assessment:  Pamela Romero is a 68 y.o. (year old), female patient, seen today for interventional treatment. She  has a past surgical history that includes Thyroidectomy (2005); Cardiac catheterization (2006); Total abdominal hysterectomy; Breast surgery (Right, March 2014); and Breast cyst aspiration (Right). Pamela Romero has a current medication list which includes the following prescription(s): docusate sodium, esomeprazole, hydrocodone-acetaminophen, hydrocodone-acetaminophen, levothyroxine, and lisinopril, and the following Facility-Administered Medications: lidocaine. Her primarily concern today is the Procedure (Steriod injection ) and Knee Pain  Initial Vital Signs:  Pulse/HCG Rate: 87  Temp: 98.4 F (36.9 C) Resp: 18 BP: (!) 149/78 SpO2: 100 %  BMI: Estimated body mass index is 31.47 kg/m as calculated from the following:   Height as of this  encounter: 5' 8.5" (1.74 m).   Weight as of this encounter: 210 lb (95.3 kg).  Risk Assessment: Allergies: Reviewed. She has No Known Allergies.  Allergy Precautions: None required Coagulopathies: Reviewed. None identified.  Blood-thinner therapy: None at this time Active Infection(s): Reviewed. None identified. Pamela Romero is afebrile  Site Confirmation: Pamela Romero was asked to confirm the procedure and laterality before marking the site Procedure checklist: Completed Consent: Before the procedure and under the influence of no sedative(s), amnesic(s), or anxiolytics, the patient was informed of the treatment options, risks and possible complications. To fulfill our ethical and legal obligations, as recommended by the American Medical Association's Code of Ethics, I have informed the patient of my clinical impression; the nature and purpose of the treatment or procedure; the risks, benefits, and possible complications of the intervention; the alternatives, including doing nothing; the risk(s) and benefit(s) of the alternative treatment(s) or procedure(s); and the risk(s) and benefit(s) of doing nothing. The patient was provided information about the general risks and possible complications associated with the procedure. These may include, but are not limited to: failure to achieve desired goals, infection, bleeding, organ or nerve damage, allergic reactions, paralysis, and death. In addition, the patient was informed of those risks and complications associated to the procedure, such as failure to decrease pain; infection; bleeding; organ or nerve damage with subsequent damage to sensory, motor, and/or autonomic systems, resulting in permanent pain, numbness, and/or weakness of one or several areas of the body; allergic reactions; (i.e.: anaphylactic reaction); and/or death. Furthermore, the patient was informed of those risks and complications associated with the medications. These include, but are  not limited to: allergic reactions (i.e.: anaphylactic or anaphylactoid reaction(s)); adrenal axis suppression; blood sugar elevation that in diabetics may result in ketoacidosis or comma; water  retention that in patients with history of congestive heart failure may result in shortness of breath, pulmonary edema, and decompensation with resultant heart failure; weight gain; swelling or edema; medication-induced neural toxicity; particulate matter embolism and blood vessel occlusion with resultant organ, and/or nervous system infarction; and/or aseptic necrosis of one or more joints. Finally, the patient was informed that Medicine is not an exact science; therefore, there is also the possibility of unforeseen or unpredictable risks and/or possible complications that may result in a catastrophic outcome. The patient indicated having understood very clearly. We have given the patient no guarantees and we have made no promises. Enough time was given to the patient to ask questions, all of which were answered to the patient's satisfaction. Pamela Romero has indicated that she wanted to continue with the procedure. Attestation: I, the ordering provider, attest that I have discussed with the patient the benefits, risks, side-effects, alternatives, likelihood of achieving goals, and potential problems during recovery for the procedure that I have provided informed consent. Date  Time: 06/27/2018  9:13 AM  Pre-Procedure Preparation:  Monitoring: As per clinic protocol. Respiration, ETCO2, SpO2, BP, heart rate and rhythm monitor placed and checked for adequate function Safety Precautions: Patient was assessed for positional comfort and pressure points before starting the procedure. Time-out: I initiated and conducted the "Time-out" before starting the procedure, as per protocol. The patient was asked to participate by confirming the accuracy of the "Time Out" information. Verification of the correct person, site, and  procedure were performed and confirmed by me, the nursing staff, and the patient. "Time-out" conducted as per Joint Commission's Universal Protocol (UP.01.01.01). Time: 0940  Description of Procedure:          Target Area: Knee Joint Approach: Just above the Lateral tibial plateau, lateral to the infrapatellar tendon. Area Prepped: Entire knee area, from the mid-thigh to the mid-shin. Prepping solution: ChloraPrep (2% chlorhexidine gluconate and 70% isopropyl alcohol) Safety Precautions: Aspiration looking for blood return was conducted prior to all injections. At no point did we inject any substances, as a needle was being advanced. No attempts were made at seeking any paresthesias. Safe injection practices and needle disposal techniques used. Medications properly checked for expiration dates. SDV (single dose vial) medications used. Description of the Procedure: Protocol guidelines were followed. The patient was placed in position over the fluoroscopy table. The target area was identified and the area prepped in the usual manner. Skin & deeper tissues infiltrated with local anesthetic. Appropriate amount of time allowed to pass for local anesthetics to take effect. The procedure needles were then advanced to the target area. Proper needle placement secured. Negative aspiration confirmed. Solution injected in intermittent fashion, asking for systemic symptoms every 0.5cc of injectate. The needles were then removed and the area cleansed, making sure to leave some of the prepping solution back to take advantage of its long term bactericidal properties. Vitals:   06/27/18 0922 06/27/18 0943  BP: (!) 149/78 (!) 159/102  Pulse: 87 86  Resp: 18   Temp: 98.4 F (36.9 C)   TempSrc: Oral   SpO2: 100%   Weight: 210 lb (95.3 kg)   Height: 5' 8.5" (1.74 m)     Start Time: 0941 hrs. End Time: 0943 hrs. Materials:  Needle(s) Type: Regular needle Gauge: 25G Length: 1.5-in Medication(s): Please see  orders for medications and dosing details. 3 cc solution made of 2 cc of 0.2% Ropivacaine and 1 cc of Decadron 10 mg/cc, injected intra-articular  Imaging Guidance:  Type of Imaging Technique: None used Indication(s): N/A Exposure Time: No patient exposure Contrast: None used. Fluoroscopic Guidance: N/A Ultrasound Guidance: N/A Interpretation: N/A  Antibiotic Prophylaxis:   Anti-infectives (From admission, onward)   None     Indication(s): None identified  Post-operative Assessment:  Post-procedure Vital Signs:  Pulse/HCG Rate: 86  Temp: 98.4 F (36.9 C) Resp: 18 BP: (!) 159/102 SpO2: 100 %  EBL: None  Complications: No immediate post-treatment complications observed by team, or reported by patient.  Note: The patient tolerated the entire procedure well. A repeat set of vitals were taken after the procedure and the patient was kept under observation following institutional policy, for this type of procedure. Post-procedural neurological assessment was performed, showing return to baseline, prior to discharge. The patient was provided with post-procedure discharge instructions, including a section on how to identify potential problems. Should any problems arise concerning this procedure, the patient was given instructions to immediately contact us, at any time, without hesitation. In any case, we plan to contact the patient by telephone for a follow-up status report regarding this interventional procedure.  Comments:  No additional relevant information.  Plan of Care  Orders:  No orders of the defined types were placed in this encounter.  Medications ordered for procedure: Meds ordered this encounter  Medications  . lidocaine (XYLOCAINE) 2 % (with pres) injection 400 mg  . ropivacaine (PF) 2 mg/mL (0.2%) (NAROPIN) injection 4 mL  . dexamethasone (DECADRON) injection 10 mg   Medications administered: We administered ropivacaine (PF) 2 mg/mL (0.2%) and  dexamethasone.  See the medical record for exact dosing, route, and time of administration.  Disposition: Discharge home  Discharge Date & Time: 06/27/2018; 0950 hrs.   Follow-up plan:    Future Appointments  Date Time Provider [place]  07/31/2018  8:45 AM Gillis Santa, MD [facility] None  09/05/2018  8:00 AM Mikey College, NP [facility] None   Primary Care Physician: Mikey College, NP Location: [facility] Management Facility Note by: Gillis Santa, MD Date: 06/27/2018; Time: 9:53 AM  Disclaimer:  Medicine is not an exact science. The only guarantee in medicine is that nothing is guaranteed. It is important to note that the decision to proceed with this intervention was based on the information collected from the patient. The Data and conclusions were drawn from the patient's questionnaire, the interview, and the physical examination. Because the information was provided in large part by the patient, it cannot be guaranteed that it has not been purposely or unconsciously manipulated. Every effort has been made to obtain as much relevant data as possible for this evaluation. It is important to note that the conclusions that lead to this procedure are derived in large part from the available data. Always take into account that the treatment will also be dependent on availability of resources and existing treatment guidelines, considered by other Pain Management Practitioners as being common knowledge and practice, at the time of the intervention. For Medico-Legal purposes, it is also important to point out that variation in procedural techniques and pharmacological choices are the acceptable norm. The indications, contraindications, technique, and results of the above procedure should only be interpreted and judged by a Board-Certified Interventional Pain Specialist with extensive familiarity and expertise in the same exact procedure and technique.

## 2018-06-27 NOTE — Progress Notes (Signed)
Safety precautions to be maintained throughout the outpatient stay will include: orient to surroundings, keep bed in low position, maintain call bell within reach at all times, provide assistance with transfer out of bed and ambulation.  

## 2018-06-27 NOTE — Telephone Encounter (Signed)
Letter is provided.

## 2018-06-27 NOTE — Telephone Encounter (Signed)
Pt needs a note to return to work saying she is not sick due to a Art therapist saying he had traveled and was quarantined.   She was sick recently but is fine now and had already returned to work.  Her call back number is [phone]

## 2018-06-28 ENCOUNTER — Telehealth: Payer: Self-pay

## 2018-06-28 NOTE — Telephone Encounter (Signed)
Mrs Greaves not available spoke with husband and states she is doing better. Instructed to call if needed.

## 2018-07-02 ENCOUNTER — Telehealth: Payer: Self-pay | Admitting: *Deleted

## 2018-07-03 NOTE — Telephone Encounter (Signed)
Offered to see patient to assess but she wants to see how it is if she rest it for a few days. Denies numbness or weakness. Just says it aches. Improves with heat and rest. Patient to call tomorrow if she wants to be seen.

## 2018-07-31 ENCOUNTER — Ambulatory Visit
Payer: Medicare Other | Attending: Student in an Organized Health Care Education/Training Program | Admitting: Student in an Organized Health Care Education/Training Program

## 2018-07-31 ENCOUNTER — Other Ambulatory Visit: Payer: Self-pay

## 2018-07-31 DIAGNOSIS — G894 Chronic pain syndrome: Secondary | ICD-10-CM | POA: Diagnosis not present

## 2018-07-31 DIAGNOSIS — M170 Bilateral primary osteoarthritis of knee: Secondary | ICD-10-CM

## 2018-07-31 DIAGNOSIS — M1712 Unilateral primary osteoarthritis, left knee: Secondary | ICD-10-CM | POA: Diagnosis not present

## 2018-07-31 DIAGNOSIS — Z79891 Long term (current) use of opiate analgesic: Secondary | ICD-10-CM | POA: Diagnosis not present

## 2018-07-31 DIAGNOSIS — M722 Plantar fascial fibromatosis: Secondary | ICD-10-CM

## 2018-07-31 MED ORDER — HYDROCODONE-ACETAMINOPHEN 7.5-325 MG PO TABS: 1.0000 | ORAL_TABLET | Freq: Four times a day (QID) | ORAL | 0 refills | Status: DC | PRN

## 2018-07-31 MED ORDER — HYDROCODONE-ACETAMINOPHEN 7.5-325 MG PO TABS: 1.0000 | ORAL_TABLET | Freq: Four times a day (QID) | ORAL | 0 refills | Status: AC | PRN

## 2018-07-31 NOTE — Progress Notes (Signed)
Pain Management Virtual Encounter Note - Virtual Visit via [place] (real-time audio visits between healthcare provider and patient).  Patient's Phone No. & Preferred Pharmacy:  [phone] (home); [phone] (mobile); (Preferred) [phone] No e-mail address on record  [id number] - [place], [place] - [address] [place] [address] Phone: [phone] Fax: [phone]  [facility] [place], [place] [address] [place] [address] Phone: [phone] Fax: [phone]   Pre-screening note:  Our staff contacted [name] and offered her an "in person", "face-to-face" appointment versus a telephone encounter. She indicated preferring the telephone encounter, at this time.  Reason for Virtual Visit: COVID-19*  Social distancing based on CDC and AMA recommendations.   I contacted Pamela Romero on 07/31/2018 at 10:07 AM via video conference and clearly identified myself as Pamela Santa, MD. I verified that I was speaking with the correct person using two identifiers (Name and date of birth: 06-04-1950).  Advanced Informed Consent I sought verbal advanced consent from Pamela Romero for virtual visit interactions. I informed Pamela Romero of possible security and privacy concerns, risks, and limitations associated with providing "not-in-person" medical evaluation and management services. I also informed Pamela Romero of the availability of "in-person" appointments. Finally, I informed her that there would be a charge for the virtual visit and that she could be  personally, fully or partially, financially responsible for it. Pamela Romero expressed understanding and agreed to proceed.   Historic Elements   Pamela Romero is a 68 y.o. year old, female patient evaluated today after her last encounter by our practice on 07/02/2018. Pamela Romero  has a past medical history of Abnormal EKG, CAD  (coronary artery disease), Cancer ([place]), Endometrial adenocarcinoma ([place]) (2011), Hyperlipidemia, Hypertension, Osteoarthritis, Reflux gastritis, Thyroid disease, tobacco abuse, and Vaginal delivery. She also  has a past surgical history that includes Thyroidectomy (2005); Cardiac catheterization (2006); Total abdominal hysterectomy; Breast surgery (Right, March 2014); and Breast cyst aspiration (Right). [name] has a current medication list which includes the following prescription(s): esomeprazole, hydrocodone-acetaminophen, levothyroxine, lisinopril, docusate sodium, and hydrocodone-acetaminophen. She  reports that she quit smoking about 2 months ago. She has never used smokeless tobacco. She reports that she does not drink alcohol or use drugs. [name] has No Known Allergies.   HPI  I last saw her on 06/27/2018. She is being evaluated for both, medication management and a post-procedure assessment.  Post-Procedure Evaluation  Procedure: Left knee IA steroid injection Pre-procedure pain level:  10/10 Post-procedure: 0/10          Sedation: Please see nurses note.  Effectiveness during initial hour after procedure(Ultra-Short Term Relief): 100%  Local anesthetic used: Long-acting (4-6 hours) Effectiveness: Defined as any analgesic benefit obtained secondary to the administration of local anesthetics. This carries significant diagnostic value as to the etiological location, or anatomical origin, of the pain. Duration of benefit is expected to coincide with the duration of the local anesthetic used.  Effectiveness during initial 4-6 hours after procedure(Short-Term Relief): 100%  Long-term benefit: Defined as any relief past the pharmacologic duration of the local anesthetics.  Effectiveness past the initial 6 hours after procedure(Long-Term Relief): 100 %  Current benefits: Defined as benefit that persist at this time.   Analgesia:  >50% relief Function: Back to baseline ROM: Back to  baseline  Pharmacotherapy Assessment   06/30/2018  1   05/31/2018  Hydrocodone-Acetamin 7.5-325  105.00 30 Bi Lat   [id number]   Nor (1409)   0  26.25 MME  Comm Ins   [place]     Monitoring: Pharmacotherapy: No side-effects or adverse reactions reported. [place] PMP: PDMP reviewed during this encounter.       Compliance: No problems identified or detected. Plan: Refer to "POC".  Review of recent tests  DG Ankle Complete Right CLINICAL DATA:  Bilateral knee osteoarthritis with outward rotation of the feet. Evaluate for underlying ankle abnormality.  EXAM: RIGHT ANKLE - COMPLETE 3+ VIEW  COMPARISON:  Concurrently obtained radiographs of the left ankle  FINDINGS: No evidence of acute fracture, malalignment or ankle joint effusion. No degenerative changes present within the tibiotalar joint. There is degenerative osteoarthritis at the talonavicular, calcaneonavicular and talonavicular joints. Further degenerative changes are present in the midfoot within the intertarsal joints. No evidence of osseous lesion.  IMPRESSION: Degenerative osteoarthritis in the hindfoot and midfoot.  Electronically Signed   By: Jacqulynn Cadet M.D.   On: 05/22/2018 09:18 DG Ankle Complete Left CLINICAL DATA:  68 year old female with knee osteoarthritis and outward rotation of the feet. Evaluate for underlying ankle abnormality.  EXAM: LEFT ANKLE COMPLETE - 3+ VIEW  COMPARISON:  Concurrently obtained radiographs of the right ankle  FINDINGS: No evidence of acute fracture or malalignment. No ankle joint effusion or focal degenerative changes in the ankle itself. No evidence of bony lesion. There is degenerative osteoarthritis in the midfoot in the intertarsal joints.  IMPRESSION: Midfoot degenerative osteoarthritis within the intertarsal joints.  Electronically Signed   By: Jacqulynn Cadet M.D.   On: 05/22/2018 09:16   Office Visit on 06/04/2018  Component Date Value Ref Range Status  .  Glucose, Bld 06/04/2018 73  65 - 99 mg/dL Final   Comment: .            Fasting reference interval .   . BUN 06/04/2018 19  7 - 25 mg/dL Final  . Creat 06/04/2018 1.12* 0.50 - 0.99 mg/dL Final   Comment: For patients >65 years of age, the reference limit for Creatinine is approximately 13% higher for people identified as African-American. .   . GFR, Est Non African American 06/04/2018 52* > OR = 60 mL/min/1.35m2 Final  . GFR, Est African American 06/04/2018 60  > OR = 60 mL/min/1.61m2 Final  . BUN/Creatinine Ratio 06/04/2018 17  6 - 22 (calc) Final  . Sodium 06/04/2018 143  135 - 146 mmol/L Final  . Potassium 06/04/2018 4.1  3.5 - 5.3 mmol/L Final  . Chloride 06/04/2018 103  98 - 110 mmol/L Final  . CO2 06/04/2018 26  20 - 32 mmol/L Final  . Calcium 06/04/2018 9.1  8.6 - 10.4 mg/dL Final  . Total Protein 06/04/2018 6.8  6.1 - 8.1 g/dL Final  . Albumin 06/04/2018 4.1  3.6 - 5.1 g/dL Final  . Globulin 06/04/2018 2.7  1.9 - 3.7 g/dL (calc) Final  . AG Ratio 06/04/2018 1.5  1.0 - 2.5 (calc) Final  . Total Bilirubin 06/04/2018 0.3  0.2 - 1.2 mg/dL Final  . Alkaline phosphatase (APISO) 06/04/2018 76  37 - 153 U/L Final  . AST 06/04/2018 19  10 - 35 U/L Final  . ALT 06/04/2018 10  6 - 29 U/L Final  . Magnesium 06/04/2018 1.9  1.5 - 2.5 mg/dL Final  . TSH W/REFLEX TO FT4 06/04/2018 50.77* 0.40 - 4.50 mIU/L Final  . Free T4 06/04/2018 0.7* 0.8 - 1.8 ng/dL Final   Assessment  The  primary encounter diagnosis was Primary osteoarthritis of left knee. Diagnoses of Chronic pain syndrome, Bilateral primary osteoarthritis of knee, Chronic use of opiate for therapeutic purpose, and Plantar fascia syndrome were also pertinent to this visit.  Significant pain relief for 2-1/2 to 3 weeks after previous left intra-articular steroid injection.  Is endorsing increased cramping of her knee.  Discussed repeating intra-articular steroid injection when able to after COVID-19 restriction is lifted for elective  procedures.  Otherwise patient has been laid off.  Has been followed for unemployment.  States that she is doing the best that she can.  Refill of medications as below.  Will wean back down to 90 after intra-articular steroid injection.  Plan of Care  I am having 29 M. Behlke start on HYDROcodone-acetaminophen. I am also having her maintain her docusate sodium, esomeprazole, lisinopril, levothyroxine, and HYDROcodone-acetaminophen.  Pharmacotherapy (Medications Ordered): Meds ordered this encounter  Medications  . HYDROcodone-acetaminophen ([place]) 7.5-325 MG tablet    Sig: Take 1 tablet by mouth every 6 (six) hours as needed for up to 30 days for moderate pain.    Dispense:  105 tablet    Refill:  0    Do not place this medication, or any other prescription from our practice, on "Automatic Refill". Patient may have prescription filled one day early if pharmacy is closed on scheduled refill date. :  . HYDROcodone-acetaminophen ([place]) 7.5-325 MG tablet    Sig: Take 1 tablet by mouth every 6 (six) hours as needed for up to 30 days for severe pain. Must last 30 days.    Dispense:  105 tablet    Refill:  0    [place] STOP ACT - Not applicable. Fill one day early if pharmacy is closed on scheduled refill date.   Orders:  Orders Placed This Encounter  Procedures  . KNEE INJECTION    Local Anesthetic & Steroid injection.    Standing Status:   Future    Standing Expiration Date:   08/30/2018    Scheduling Instructions:     Side: Left-sided     Sedation: None     Timeframe: ASAA    Order Specific Question:   Where will this procedure be performed?    Answer:   [facility]     Follow-up plan:   Return in about 8 weeks (around 09/25/2018) for Medication Management.   I discussed the assessment and treatment plan with the patient. The patient was provided an opportunity to ask questions and all were answered. The patient agreed with the plan and demonstrated an understanding of the  instructions.  Patient advised to call back or seek an in-person evaluation if the symptoms or condition worsens.  Total duration of non-face-to-face encounter: 25 minutes.  Note by: Pamela Santa, MD Date: 07/31/2018; Time: 10:07 AM  Disclaimer:  * Given the special circumstances of the COVID-19 pandemic, the federal government has announced that the Office for Civil Rights (OCR) will exercise its enforcement discretion and will not impose penalties on physicians using telehealth in the event of noncompliance with regulatory requirements under the [place] and Accountability Act (HIPAA) in connection with the good faith provision of telehealth during the LYYTK-35 national public health emergency. ([place])

## 2018-08-02 ENCOUNTER — Telehealth: Payer: Self-pay

## 2018-08-02 NOTE — Telephone Encounter (Addendum)
The pt complains of constant SOB but admits it worsen with movement x 5 days ago. Also mild dry cough. denies fever. She denies any exposure to COVID 19, and has not traveled out of [place] in the last month. She states she have currently been in the house for the last 4 weeks. She quit smoking 2 mths ago, but have had issues with SOB in the past. Please advise   The pt seem to be stable. She able to engage with conversation without having to take a deep breath. I scheduled her a office visit for tomorrow at 9:40am.

## 2018-08-02 NOTE — Telephone Encounter (Signed)
Covering inbox for Pamela Romero, AGPCNP-BC while she is out of office on maternity leave.  Discussed case with Donnie Mesa CMA, agree with triage, will see patient as scheduled 4/24. Return criteria was given or when to seek more immediate care.  [name], [facility] 08/02/2018, 1:01 PM

## 2018-08-03 ENCOUNTER — Other Ambulatory Visit: Payer: Self-pay

## 2018-08-03 ENCOUNTER — Encounter: Payer: Self-pay | Admitting: Family Medicine

## 2018-08-03 ENCOUNTER — Ambulatory Visit (INDEPENDENT_AMBULATORY_CARE_PROVIDER_SITE_OTHER): Payer: BLUE CROSS/BLUE SHIELD | Admitting: Family Medicine

## 2018-08-03 DIAGNOSIS — J441 Chronic obstructive pulmonary disease with (acute) exacerbation: Secondary | ICD-10-CM | POA: Diagnosis not present

## 2018-08-03 DIAGNOSIS — R0609 Other forms of dyspnea: Secondary | ICD-10-CM | POA: Diagnosis not present

## 2018-08-03 MED ORDER — ALBUTEROL SULFATE HFA 108 (90 BASE) MCG/ACT IN AERS: 2.0000 | INHALATION_SPRAY | RESPIRATORY_TRACT | 0 refills | Status: DC | PRN

## 2018-08-03 MED ORDER — PREDNISONE 50 MG PO TABS: 50.0000 mg | ORAL_TABLET | Freq: Every day | ORAL | 0 refills | Status: DC

## 2018-08-03 NOTE — Progress Notes (Signed)
Virtual Visit via Telephone The purpose of this virtual visit is to provide medical care while limiting exposure to the novel coronavirus (COVID19) for both patient and office staff.  Consent was obtained for phone visit:  Yes.   Answered questions that patient had about telehealth interaction:  Yes.   I discussed the limitations, risks, security and privacy concerns of performing an evaluation and management service by telephone. I also discussed with the patient that there may be a patient responsible charge related to this service. The patient expressed understanding and agreed to proceed.  Patient Location: Home Provider Location: [facility] (Office)  PCP is Cassell Smiles, AGPCNP-BC - I am currently covering during her maternity leave.  ---------------------------------------------------------------------- Chief Complaint  Patient presents with  . Shortness of Breath    intermittent SOB x 2 weeks. Pt state the symptoms worsen with movement. Mild dry cough that intermittent, not persistent.   . Constipation    pt currently taking Ducolax laxatives every other week. Currently having bm every six days.    S: Reviewed CMA documentation. 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 Occasional dry cough. No sick contacts. She has not been dx with COPD. She does not have albuterol or other breathing treatment. - She is a former smoker, long history tobacco abuse 0.5ppd for 50 years, quit 2 months ago - She has history of some HTN and CAD, does not currently have cardiologist  Patient currently at home in isolation Denies any high risk travel to areas of current concern for [place]. Denies any known or suspected exposure to person with or possibly with COVID19.  Denies  any fevers, chills, sweats, body ache, chest pain or pressure, sinus pain or pressure, headache, abdominal pain, diarrhea, dizziness lightheadedness, syncope  Social History   Tobacco Use  . Smoking status: Former Smoker    Packs/day: 0.50    Years: 50.00    Pack years: 25.00    Last attempt to quit: 05/23/2018    Years since quitting: 0.1  . Smokeless tobacco: Never Used  . Tobacco comment: 2-3 cigs a day  Substance Use Topics  . Alcohol use: No  . Drug use: No    -------------------------------------------------------------------------- O: No physical exam performed due to remote telephone encounter.  -------------------------------------------------------------------------- A&P:   Improved today Suspected Acute COPD Exacerbation most likely in setting of chronic tobacco abuse Cannot rule out underlying other cardiovascular etiology Less likely infectious given lack of other symptoms or fever - Reassuring without high risk symptoms - Afebrile  - Currently patient is MODERATE RISK for COVID19 based on current symptoms and no known travel/exposure - however at this time, now [place] is currently within phase of community spread, and therefore patient can still be potentially exposed. Cannot rule out case with mild symptoms. Testing is not recommended at this time due to mild symptoms.  She declined to go to hospital in recent triage.  1. START Prednisone burst 50mg  daily x 5 days - counsel on side effects 2. START Albuterol rescue inhaler q 4 hr PRN 3. Monitor symptoms 4. Notify if worsening or new concerns - if improved then we can consider adding a daily breathing treatment for COPD maintenance - spiriva or other option  Additionally if new concerns for underlying cardiac etiology - advised to go to hospital -  we can consider future Cardiology referral if needed  Strict return criteria given if not improved - when to go to hospital ED or Urgent Care  Meds ordered this  encounter  Medications  . albuterol (VENTOLIN HFA) 108 (90 Base) MCG/ACT inhaler    Sig: Inhale 2 puffs into the lungs every 4 (four) hours as needed for wheezing or shortness of breath (cough).    Dispense:  1 Inhaler    Refill:  0  . predniSONE (DELTASONE) 50 MG tablet    Sig: Take 1 tablet (50 mg total) by mouth daily with breakfast.    Dispense:  5 tablet    Refill:  0   OPTIONAL RECOMMENDED self quarantine for patient safety for PREVENTION ONLY. It is not required based on current clinical symptoms. If they were to develop fever or worsening shortness of breath, then emphasis on REQUIRED quarantine for up to 7-14 days that could be resolved if fever free >3 days AND if symptoms improving after 7 days.   If symptoms do not resolve or significantly improve OR if WORSENING - fever / cough - or worsening shortness of breath - then should contact us and seek advice on next steps in treatment at home vs where/when to seek care at Urgent Care or Hospital ED for further intervention and possible testing if indicated.  Patient verbalizes understanding with the above medical recommendations including the limitation of remote medical advice.  Specific follow-up / call-back criteria were given for patient to follow-up or seek medical care more urgently if needed.   - Time spent in direct consultation with patient on phone: 12 minutes  [name], [facility] 08/03/2018, 9:35 AM

## 2018-08-03 NOTE — Patient Instructions (Addendum)
AVS given over phone. No access to [employer]

## 2018-08-24 ENCOUNTER — Other Ambulatory Visit: Payer: Self-pay | Admitting: Family Medicine

## 2018-08-24 DIAGNOSIS — J441 Chronic obstructive pulmonary disease with (acute) exacerbation: Secondary | ICD-10-CM

## 2018-09-04 ENCOUNTER — Telehealth: Payer: Self-pay

## 2018-09-04 DIAGNOSIS — J441 Chronic obstructive pulmonary disease with (acute) exacerbation: Secondary | ICD-10-CM

## 2018-09-04 MED ORDER — UMECLIDINIUM-VILANTEROL 62.5-25 MCG/INH IN AEPB: 1.0000 | INHALATION_SPRAY | Freq: Every day | RESPIRATORY_TRACT | 0 refills | Status: DC

## 2018-09-04 NOTE — Telephone Encounter (Signed)
Covering inbox for Pamela Romero, AGPCNP-BC while she is out of office.  Last seen by me on 08/03/18 for COPD - discussed this is likely a new diagnosis based on her clinical history no formal dx before.  She can start with maintenance therapy with Anoro - she can come by office to pick up sample, and then when she does pick it up and try it she should call us back to let us know if it is beneficial and we can send rx to her pharmacy, once daily use Anoro to prevent COPD flares or reduce flare ups.  In future if still not improving, then we would refer her to Pulmonology  [name], [facility] 09/04/2018, 4:42 PM

## 2018-09-04 NOTE — Telephone Encounter (Signed)
Pt complaining of intermittent SOB. Some days no problem and she doesn't have to use the inhaler. Then there are days that she struggle getting through daily activities like folding clothes without getting short of breath. She wanted to know if this is common w/ COPD. Today she is a little more short of breath. She had to sit down to get her breath while cooking. She reports that after resting a few minutes she recovers. Please advise

## 2018-09-05 ENCOUNTER — Ambulatory Visit: Payer: BLUE CROSS/BLUE SHIELD | Admitting: Nurse Practitioner

## 2018-09-05 MED ORDER — UMECLIDINIUM-VILANTEROL 62.5-25 MCG/INH IN AEPB: 1.0000 | INHALATION_SPRAY | Freq: Every day | RESPIRATORY_TRACT | 2 refills | Status: DC

## 2018-09-05 NOTE — Telephone Encounter (Signed)
Sent rx Anoro to her pharmacy, it should be covered. If she needs a sample she can contact us when they are in stock, otherwise can fill rx and just start medication now.  [name], [facility] 09/05/2018, 10:15 AM

## 2018-09-05 NOTE — Telephone Encounter (Signed)
The pt was notified of your recommendation. We currently do not have any Anoro samples in stock. I notified the patient as soon as we get them we will give her a call.

## 2018-09-10 ENCOUNTER — Other Ambulatory Visit: Payer: Self-pay

## 2018-09-10 DIAGNOSIS — Z01818 Encounter for other preprocedural examination: Secondary | ICD-10-CM

## 2018-09-12 ENCOUNTER — Other Ambulatory Visit: Payer: Self-pay

## 2018-09-12 ENCOUNTER — Ambulatory Visit (INDEPENDENT_AMBULATORY_CARE_PROVIDER_SITE_OTHER): Payer: BC Managed Care – PPO | Admitting: Family Medicine

## 2018-09-12 ENCOUNTER — Encounter: Payer: Self-pay | Admitting: Family Medicine

## 2018-09-12 DIAGNOSIS — I10 Essential (primary) hypertension: Secondary | ICD-10-CM | POA: Diagnosis not present

## 2018-09-12 DIAGNOSIS — K5900 Constipation, unspecified: Secondary | ICD-10-CM | POA: Diagnosis not present

## 2018-09-12 DIAGNOSIS — E890 Postprocedural hypothyroidism: Secondary | ICD-10-CM | POA: Diagnosis not present

## 2018-09-12 DIAGNOSIS — J441 Chronic obstructive pulmonary disease with (acute) exacerbation: Secondary | ICD-10-CM | POA: Diagnosis not present

## 2018-09-12 MED ORDER — POLYETHYLENE GLYCOL 3350 17 GM/SCOOP PO POWD: 17.0000 g | Freq: Every day | ORAL | 1 refills | Status: DC | PRN

## 2018-09-12 NOTE — Progress Notes (Signed)
Virtual Visit via Telephone The purpose of this virtual visit is to provide medical care while limiting exposure to the novel coronavirus (COVID19) for both patient and office staff.  Consent was obtained for phone visit:  Yes.   Answered questions that patient had about telehealth interaction:  Yes.   I discussed the limitations, risks, security and privacy concerns of performing an evaluation and management service by telephone. I also discussed with the patient that there may be a patient responsible charge related to this service. The patient expressed understanding and agreed to proceed.  Patient Location: Home Provider Location: [facility])  ---------------------------------------------------------------------- Chief Complaint  Patient presents with  . Hypertension  . Thyroid Problem  . Constipation    persistent constipation. Pt have to strain when she have to make bowel movement. She report she does feel like something coming out after she have a bowel movement.    S: Reviewed CMA documentation. I have called patient and gathered additional HPI as follows:  CHRONIC HTN: Reports no concerns. Current Meds - Lisinopril 10mg  daily   Reports good compliance, took meds today. Tolerating well, w/o complaints. Denies CP, dyspnea, HA, edema, dizziness / lightheadedness  Hypothyroidism Last lab was 07/2017 had TSH >40. Also most recent had TSH 50 in 05/2018, she was taking 163mcg x 2 = 226mcg daily without problem. Due for testing. No new concerns or symptoms of thyroid problem Denies temperature change, hair or nail problem, reduced energy  Constipation Managed by [facility] on opiates, hydrocodone. She was taking Dulcolax for constipation. She request additional medication or treatment options. She tries to hydrate. Never taken miralax, she is straining more with BMs. Denies rectal bleeding or blood in bowel movement.  Follow-up COPD Last visit  07/2018, treated for acute COPD. Improved. Now taking Anoro with good results. Still occasional short of breath.  Denies any high risk travel to areas of current concern for COVID19. Denies any known or suspected exposure to person with or possibly with COVID19.  Denies any fevers, chills, sweats, body ache, cough, shortness of breath, sinus pain or pressure, headache, abdominal pain, diarrhea  Past Medical History:  Diagnosis Date  . Abnormal EKG   . CAD (coronary artery disease)    non critical, by cardiac cath 2006  . Cancer ([place])    endometrial Ca  . Endometrial adenocarcinoma ([place]) 2011  . Hyperlipidemia   . Hypertension   . Osteoarthritis    Right foot, Left knee (Dr. Roland Rack)  . Reflux gastritis   . Thyroid disease   . tobacco abuse   . Vaginal delivery    x 2   Social History   Tobacco Use  . Smoking status: Former Smoker    Packs/day: 0.50    Years: 50.00    Pack years: 25.00    Last attempt to quit: 05/23/2018    Years since quitting: 0.3  . Smokeless tobacco: Never Used  . Tobacco comment: 2-3 cigs a day  Substance Use Topics  . Alcohol use: No  . Drug use: No    Current Outpatient Medications:  .  docusate sodium (COLACE) 100 MG capsule, Take 100 mg by mouth 2 (two) times daily as needed., Disp: , Rfl:  .  esomeprazole (NEXIUM) 40 MG capsule, Take 1 capsule (40 mg total) by mouth daily., Disp: 30 capsule, Rfl: 3 .  HYDROcodone-acetaminophen (NORCO) 7.5-325 MG tablet, Take 1 tablet by mouth every 6 (six) hours as needed for up to 30 days for  severe pain. Must last 30 days., Disp: 105 tablet, Rfl: 0 .  levothyroxine (SYNTHROID, LEVOTHROID) 125 MCG tablet, Take two tablets by mouth daily before breakfast with water., Disp: 60 tablet, Rfl: 3 .  lisinopril (PRINIVIL,ZESTRIL) 10 MG tablet, Take 1 tablet (10 mg total) by mouth daily., Disp: 90 tablet, Rfl: 1 .  PROAIR HFA 108 (90 Base) MCG/ACT inhaler, INHALE 2 PUFFS INTO THE LUNGS EVERY 4 HOURS AS NEEDED FOR WHEEZING  OR SHORTNESS OF BREATH (COUGH, Disp: 8.5 Inhaler, Rfl: 2 .  umeclidinium-vilanterol (ANORO ELLIPTA) 62.5-25 MCG/INH AEPB, Inhale 1 puff into the lungs daily., Disp: 60 each, Rfl: 2 .  polyethylene glycol powder (GLYCOLAX/MIRALAX) 17 GM/SCOOP powder, Take 17 g by mouth daily as needed for mild constipation or moderate constipation., Disp: 255 g, Rfl: 1  Depression screen [facility] 2/9 09/12/2018 06/27/2018 05/31/2018  Decreased Interest 0 0 0  Down, Depressed, Hopeless 0 0 0  PHQ - 2 Score 0 0 0  Altered sleeping - - -  Tired, decreased energy - - -  Change in appetite - - -  Feeling bad or failure about yourself  - - -  Trouble concentrating - - -  Moving slowly or fidgety/restless - - -  Suicidal thoughts - - -  PHQ-9 Score - - -  Difficult doing work/chores - - -    GAD 7 : Generalized Anxiety Score 09/12/2018  Nervous, Anxious, on Edge 1  Control/stop worrying 1  Worry too much - different things 0  Trouble relaxing 1  Restless 1  Easily annoyed or irritable 1  Afraid - awful might happen 1  Total GAD 7 Score 6  Anxiety Difficulty Somewhat difficult    -------------------------------------------------------------------------- O: No physical exam performed due to remote telephone encounter.  Lab results reviewed.  Last TSH 40.922, Free T4 0.77 (07/14/17)  No results found for this or any previous visit (from the past 2160 hour(s)).  -------------------------------------------------------------------------- A&P:  Problem List Items Addressed This Visit    Essential hypertension Controlled HTN on current med Continue Lisinopril Check chemistry    Relevant Orders   COMPLETE METABOLIC PANEL WITH GFR   Hypothyroidism Previously elevated TSH. Due for lab Return tomorrow for labs with TSH Continue current Levothyroxine 136mcg x 2 = 250    Relevant Orders   TSH   T4, free   COMPLETE METABOLIC PANEL WITH GFR    Other Visit Diagnoses    Chronic obstructive pulmonary disease  with acute exacerbation (HCC)    -  Primary Clinically improved COPD with reduced dyspnea on Anoro Some residual symptoms Followup if not improving, consider pulm    Constipation, unspecified constipation type      Trial on Miralax for constipation, may use PRN vs maintenance Lifestyle changes to diet    Relevant Medications   polyethylene glycol powder (GLYCOLAX/MIRALAX) 17 GM/SCOOP powder      Meds ordered this encounter  Medications  . polyethylene glycol powder (GLYCOLAX/MIRALAX) 17 GM/SCOOP powder    Sig: Take 17 g by mouth daily as needed for mild constipation or moderate constipation.    Dispense:  255 g    Refill:  1    Follow-up: - Return in 3 months for COPD, HTN, Thyroid, Constipation, w/ PCP - Future labs ordered for tomorrow 6/4 for TSH Free T4 and CMET  Patient verbalizes understanding with the above medical recommendations including the limitation of remote medical advice.  Specific follow-up and call-back criteria were given for patient to follow-up or seek medical care  more urgently if needed.   - Time spent in direct consultation with patient on phone: 9 minutes   [name], [facility] 09/12/2018, 10:03 AM

## 2018-09-12 NOTE — Patient Instructions (Signed)
AVS info given by phone. No access to mychart.

## 2018-09-13 ENCOUNTER — Other Ambulatory Visit
Admission: RE | Admit: 2018-09-13 | Discharge: 2018-09-13 | Disposition: A | Discharge status: Home / Self Care | Payer: BC Managed Care – PPO | Source: Ambulatory Visit | Attending: Student in an Organized Health Care Education/Training Program | Admitting: Student in an Organized Health Care Education/Training Program

## 2018-09-13 ENCOUNTER — Other Ambulatory Visit: Payer: Medicare Other

## 2018-09-13 DIAGNOSIS — Z1159 Encounter for screening for other viral diseases: Secondary | ICD-10-CM | POA: Insufficient documentation

## 2018-09-14 LAB — NOVEL CORONAVIRUS, NAA (HOSP ORDER, SEND-OUT TO REF LAB; TAT 18-24 HRS): SARS-CoV-2, NAA: NOT DETECTED

## 2018-09-17 ENCOUNTER — Other Ambulatory Visit: Payer: Self-pay

## 2018-09-17 ENCOUNTER — Encounter: Payer: Self-pay | Admitting: Student in an Organized Health Care Education/Training Program

## 2018-09-17 ENCOUNTER — Ambulatory Visit
Payer: BC Managed Care – PPO | Attending: Student in an Organized Health Care Education/Training Program | Admitting: Student in an Organized Health Care Education/Training Program

## 2018-09-17 VITALS — BP 148/113 | HR 94 | Temp 98.0°F | Ht 69.0 in | Wt 210.0 lb

## 2018-09-17 DIAGNOSIS — G894 Chronic pain syndrome: Secondary | ICD-10-CM | POA: Diagnosis present

## 2018-09-17 DIAGNOSIS — M1712 Unilateral primary osteoarthritis, left knee: Secondary | ICD-10-CM | POA: Diagnosis present

## 2018-09-17 DIAGNOSIS — M170 Bilateral primary osteoarthritis of knee: Secondary | ICD-10-CM | POA: Insufficient documentation

## 2018-09-17 DIAGNOSIS — M722 Plantar fascial fibromatosis: Secondary | ICD-10-CM | POA: Diagnosis present

## 2018-09-17 MED ORDER — DEXAMETHASONE SODIUM PHOSPHATE 10 MG/ML IJ SOLN
10.0000 mg | Freq: Once | INTRAMUSCULAR | Status: AC
  Administered 2018-09-17: 10 mg
  Filled 2018-09-17: qty 1

## 2018-09-17 MED ORDER — HYDROCODONE-ACETAMINOPHEN 7.5-325 MG PO TABS: 1.0000 | ORAL_TABLET | Freq: Four times a day (QID) | ORAL | 0 refills | Status: AC | PRN

## 2018-09-17 MED ORDER — HYDROCODONE-ACETAMINOPHEN 7.5-325 MG PO TABS: 1.0000 | ORAL_TABLET | Freq: Four times a day (QID) | ORAL | 0 refills | Status: DC | PRN

## 2018-09-17 MED ORDER — LIDOCAINE HCL 2 % IJ SOLN
20.0000 mL | Freq: Once | INTRAMUSCULAR | Status: AC
  Administered 2018-09-17: 11:00:00 400 mg
  Filled 2018-09-17: qty 20

## 2018-09-17 MED ORDER — ROPIVACAINE HCL 2 MG/ML IJ SOLN
4.0000 mL | Freq: Once | INTRAMUSCULAR | Status: AC
  Administered 2018-09-17: 10 mL via INTRA_ARTICULAR
  Filled 2018-09-17: qty 10

## 2018-09-17 NOTE — Progress Notes (Signed)
Patient's Name: Pamela Romero  MRN: [id number]  Referring Provider: Mikey College, *  DOB: Sep 18, 1950  PCP: Mikey College, NP  DOS: 09/17/2018  Note by: Gillis Santa, MD  Service setting: Ambulatory outpatient  Specialty: Interventional Pain Management  Patient type: Established  Location: [facility] (AMB) Pain Management Facility  Visit type: Interventional Procedure   Primary Reason for Visit: Interventional Pain Management Treatment. CC: Knee Pain  Procedure:          Anesthesia, Analgesia, Anxiolysis:  Type: Therapeutic Intra-Articular Local anesthetic and steroid Knee Injection #2  Region: Infrapatellar Knee Region Level: Knee Joint Laterality: Left knee  Type: Local Anesthesia Indication(s): Analgesia         Local Anesthetic: Lidocaine 1-2% Route: Infiltration (Central/IM) IV Access: Declined Sedation: Declined   Position: Sitting   Indications: 1. Primary osteoarthritis of left knee   2. Bilateral primary osteoarthritis of knee   3. Chronic pain syndrome   4. Plantar fascia syndrome    Pain Score: Pre-procedure: 8 /10 Post-procedure: 0-No pain/10  Pre-op Assessment:  Pamela Romero is a 68 y.o. (year old), female patient, seen today for interventional treatment. She  has a past surgical history that includes Thyroidectomy (2005); Cardiac catheterization (2006); Total abdominal hysterectomy; Breast surgery (Right, March 2014); and Breast cyst aspiration (Right). Pamela Romero has a current medication list which includes the following prescription(s): docusate sodium, esomeprazole, hydrocodone-acetaminophen, hydrocodone-acetaminophen, levothyroxine, lisinopril, polyethylene glycol powder, proair hfa, and umeclidinium-vilanterol. Her primarily concern today is the Knee Pain  Initial Vital Signs:  Pulse/HCG Rate: 96  Temp: 98 F (36.7 C) Resp:   BP: (!) 148/113 SpO2: 99 %  BMI: Estimated body mass index is 31.01 kg/m as calculated from the following:   Height as of  this encounter: 5' 9" (1.753 m).   Weight as of this encounter: 210 lb (95.3 kg).  Risk Assessment: Allergies: Reviewed. She has No Known Allergies.  Allergy Precautions: None required Coagulopathies: Reviewed. None identified.  Blood-thinner therapy: None at this time Active Infection(s): Reviewed. None identified. Pamela Romero is afebrile  Site Confirmation: Pamela Romero was asked to confirm the procedure and laterality before marking the site Procedure checklist: Completed Consent: Before the procedure and under the influence of no sedative(s), amnesic(s), or anxiolytics, the patient was informed of the treatment options, risks and possible complications. To fulfill our ethical and legal obligations, as recommended by the American Medical Association's Code of Ethics, I have informed the patient of my clinical impression; the nature and purpose of the treatment or procedure; the risks, benefits, and possible complications of the intervention; the alternatives, including doing nothing; the risk(s) and benefit(s) of the alternative treatment(s) or procedure(s); and the risk(s) and benefit(s) of doing nothing. The patient was provided information about the general risks and possible complications associated with the procedure. These may include, but are not limited to: failure to achieve desired goals, infection, bleeding, organ or nerve damage, allergic reactions, paralysis, and death. In addition, the patient was informed of those risks and complications associated to the procedure, such as failure to decrease pain; infection; bleeding; organ or nerve damage with subsequent damage to sensory, motor, and/or autonomic systems, resulting in permanent pain, numbness, and/or weakness of one or several areas of the body; allergic reactions; (i.e.: anaphylactic reaction); and/or death. Furthermore, the patient was informed of those risks and complications associated with the medications. These include, but  are not limited to: allergic reactions (i.e.: anaphylactic or anaphylactoid reaction(s)); adrenal axis suppression; blood sugar elevation that  in diabetics may result in ketoacidosis or comma; water retention that in patients with history of congestive heart failure may result in shortness of breath, pulmonary edema, and decompensation with resultant heart failure; weight gain; swelling or edema; medication-induced neural toxicity; particulate matter embolism and blood vessel occlusion with resultant organ, and/or nervous system infarction; and/or aseptic necrosis of one or more joints. Finally, the patient was informed that Medicine is not an exact science; therefore, there is also the possibility of unforeseen or unpredictable risks and/or possible complications that may result in a catastrophic outcome. The patient indicated having understood very clearly. We have given the patient no guarantees and we have made no promises. Enough time was given to the patient to ask questions, all of which were answered to the patient's satisfaction. Pamela Romero has indicated that she wanted to continue with the procedure. Attestation: I, the ordering provider, attest that I have discussed with the patient the benefits, risks, side-effects, alternatives, likelihood of achieving goals, and potential problems during recovery for the procedure that I have provided informed consent. Date  Time: 09/17/2018 10:21 AM  Pre-Procedure Preparation:  Monitoring: As per clinic protocol. Respiration, ETCO2, SpO2, BP, heart rate and rhythm monitor placed and checked for adequate function Safety Precautions: Patient was assessed for positional comfort and pressure points before starting the procedure. Time-out: I initiated and conducted the "Time-out" before starting the procedure, as per protocol. The patient was asked to participate by confirming the accuracy of the "Time Out" information. Verification of the correct person, site,  and procedure were performed and confirmed by me, the nursing staff, and the patient. "Time-out" conducted as per Joint Commission's Universal Protocol (UP.01.01.01). Time: 1056  Description of Procedure:          Target Area: Knee Joint Approach: Just above the Lateral tibial plateau, lateral to the infrapatellar tendon. Area Prepped: Entire knee area, from the mid-thigh to the mid-shin. Prepping solution: 61M DuraPrep (Iodine Povacrylex [0.7% available iodine] and Isopropyl Alcohol, 74% w/w) Safety Precautions: Aspiration looking for blood return was conducted prior to all injections. At no point did we inject any substances, as a needle was being advanced. No attempts were made at seeking any paresthesias. Safe injection practices and needle disposal techniques used. Medications properly checked for expiration dates. SDV (single dose vial) medications used. Description of the Procedure: Protocol guidelines were followed. The patient was placed in position over the fluoroscopy table. The target area was identified and the area prepped in the usual manner. Skin & deeper tissues infiltrated with local anesthetic. Appropriate amount of time allowed to pass for local anesthetics to take effect. The procedure needles were then advanced to the target area. Proper needle placement secured. Negative aspiration confirmed. Solution injected in intermittent fashion, asking for systemic symptoms every 0.5cc of injectate. The needles were then removed and the area cleansed, making sure to leave some of the prepping solution back to take advantage of its long term bactericidal properties. Vitals:   09/17/18 1025 09/17/18 1100  BP:  (!) 148/113  Pulse: 96 94  Temp: 98 F (36.7 C)   SpO2: 99% 99%  Weight: 210 lb (95.3 kg)   Height: 5' 9" (1.753 m)     Start Time: 1056 hrs. End Time: 1057 hrs. Materials:  Needle(s) Type: Regular needle Gauge: 25G Length: 1.5-in Medication(s): Please see orders for  medications and dosing details. 3 cc solution made of 2 cc of 0.2% cocaine, 1 cc of Decadron 10 mg/cc, injected into intra-articular left  knee Imaging Guidance:          Type of Imaging Technique: None used Indication(s): N/A Exposure Time: No patient exposure Contrast: None used. Fluoroscopic Guidance: N/A Ultrasound Guidance: N/A Interpretation: N/A  Antibiotic Prophylaxis:   Anti-infectives (From admission, onward)   None     Indication(s): None identified  Post-operative Assessment:  Post-procedure Vital Signs:  Pulse/HCG Rate: 94  Temp: 98 F (36.7 C) Resp:   BP: (!) 148/113 SpO2: 99 %  EBL: None  Complications: No immediate post-treatment complications observed by team, or reported by patient.  Note: The patient tolerated the entire procedure well. A repeat set of vitals were taken after the procedure and the patient was kept under observation following institutional policy, for this type of procedure. Post-procedural neurological assessment was performed, showing return to baseline, prior to discharge. The patient was provided with post-procedure discharge instructions, including a section on how to identify potential problems. Should any problems arise concerning this procedure, the patient was given instructions to immediately contact us, at any time, without hesitation. In any case, we plan to contact the patient by telephone for a follow-up status report regarding this interventional procedure.  Comments:  No additional relevant information.  Controlled Substance Pharmacotherapy Assessment REMS (Risk Evaluation and Mitigation Strategy)   08/30/2018  1   07/31/2018  Hydrocodone-Acetamin 7.5-325  105.00 30 Bi Lat   [id number]   [place] (1409)   0  26.25 MME  Comm Ins   [place]    No notes on file Pharmacokinetics: Liberation and absorption (onset of action): WNL Distribution (time to peak effect): WNL Metabolism and excretion (duration of action): WNL          Pharmacodynamics: Desired effects: Analgesia: Pamela Romero reports >50% benefit. Functional ability: Patient reports that medication allows her to accomplish basic ADLs Clinically meaningful improvement in function (CMIF): Sustained CMIF goals met Perceived effectiveness: Described as relatively effective, allowing for increase in activities of daily living (ADL) Undesirable effects: Side-effects or Adverse reactions: None reported Monitoring: [place] PMP: Online review of the past 60-monthperiod conducted. Compliant with practice rules and regulations Last UDS on record: Summary  Date Value Ref Range Status  08/30/2017 FINAL  Final    Comment:    ==================================================================== TOXASSURE COMP DRUG ANALYSIS,UR ==================================================================== Test                             Result       Flag       Units Drug Present and Declared for Prescription Verification   Tramadol                       >1479        EXPECTED   ng/mg creat   O-Desmethyltramadol            >1479        EXPECTED   ng/mg creat   N-Desmethyltramadol            >1479        EXPECTED   ng/mg creat    Source of tramadol is a prescription medication.    O-desmethyltramadol and N-desmethyltramadol are expected    metabolites of tramadol.   Diclofenac                     PRESENT      EXPECTED Drug Present not Declared for Prescription Verification   Acetaminophen  PRESENT      UNEXPECTED Drug Absent but Declared for Prescription Verification   Duloxetine                     Not Detected UNEXPECTED ==================================================================== Test                      Result    Flag   Units      Ref Range   Creatinine              338              mg/dL      >=20 ==================================================================== Declared Medications:  The flagging and interpretation on this report are based on  the  following declared medications.  Unexpected results may arise from  inaccuracies in the declared medications.  **Note: The testing scope of this panel includes these medications:  Duloxetine  Tramadol  **Note: The testing scope of this panel does not include small to  moderate amounts of these reported medications:  Diclofenac  **Note: The testing scope of this panel does not include following  reported medications:  Docusate (Colace)  Hydrochlorothiazide (Zestoretic)  Levothyroxine (Synthroid)  Lisinopril (Zestoretic)  Omeprazole (Nexium)  Ranitidine  Rosuvastatin ==================================================================== For clinical consultation, please call [phone]. ====================================================================    UDS interpretation: Compliant          Medication Assessment Form: Reviewed. Patient indicates being compliant with therapy Treatment compliance: Compliant Risk Assessment Profile: Aberrant behavior: See initial evaluations. None observed or detected today Comorbid factors increasing risk of overdose: See initial evaluation. No additional risks detected today Opioid risk tool (ORT):  Opioid Risk  09/17/2018  Alcohol 0  Illegal Drugs 0  Rx Drugs 0  Alcohol 0  Illegal Drugs 0  Rx Drugs 0  Age between 16-45 years  0  History of Preadolescent Sexual Abuse 0  Psychological Disease 0  Depression 0  Opioid Risk Tool Scoring 0  Opioid Risk Interpretation Low Risk    ORT Scoring interpretation table:  Score <3 = Low Risk for SUD  Score between 4-7 = Moderate Risk for SUD  Score >8 = High Risk for Opioid Abuse   Risk of substance use disorder (SUD): Low  Risk Mitigation Strategies:  Patient Counseling: Covered Patient-Prescriber Agreement (PPA): Present and active  Notification to other healthcare providers: Done  Pharmacologic Plan: No change in therapy, at this time.             Plan of Care   1. Primary  osteoarthritis of left knee   2. Bilateral primary osteoarthritis of knee   3. Chronic pain syndrome   4. Plantar fascia syndrome    Medications ordered for procedure: Meds ordered this encounter  Medications  . ropivacaine (PF) 2 mg/mL (0.2%) (NAROPIN) injection 4 mL  . dexamethasone (DECADRON) injection 10 mg  . lidocaine (XYLOCAINE) 2 % (with pres) injection 400 mg  . HYDROcodone-acetaminophen (NORCO) 7.5-325 MG tablet    Sig: Take 1 tablet by mouth every 6 (six) hours as needed for up to 30 days for severe pain. Must last 30 days.    Dispense:  105 tablet    Refill:  0    [place] STOP ACT - Not applicable. Fill one day early if pharmacy is closed on scheduled refill date.  [name] HYDROcodone-acetaminophen (NORCO) 7.5-325 MG tablet    Sig: Take 1 tablet by mouth every 6 (six) hours as needed for up to 30 days  for severe pain. Must last 30 days.    Dispense:  105 tablet    Refill:  0    [place] STOP ACT - Not applicable. Fill one day early if pharmacy is closed on scheduled refill date.   UDS at next visit.   Medications administered: We administered ropivacaine (PF) 2 mg/mL (0.2%), dexamethasone, and lidocaine.  See the medical record for exact dosing, route, and time of administration.  Disposition: Discharge home  Discharge Date & Time: 09/17/2018; 1105 hrs.   Follow-up plan:   Return in about 8 weeks (around 11/12/2018) for Post Procedure Evaluation, Medication Management.     Future Appointments  Date Time Provider [place]  11/12/2018  1:30 PM Gillis Santa, MD [facility] None  01/11/2019  9:20 AM Mikey College, NP [facility] None   Primary Care Physician: Mikey College, NP Location: [facility] Outpatient [facility] Management Facility Note by: Gillis Santa, MD Date: 09/17/2018; Time: 11:55 AM  Disclaimer:  Medicine is not an exact science. The only guarantee in medicine is that nothing is guaranteed. It is important to note that the decision to proceed with this intervention  was based on the information collected from the patient. The Data and conclusions were drawn from the patient's questionnaire, the interview, and the physical examination. Because the information was provided in large part by the patient, it cannot be guaranteed that it has not been purposely or unconsciously manipulated. Every effort has been made to obtain as much relevant data as possible for this evaluation. It is important to note that the conclusions that lead to this procedure are derived in large part from the available data. Always take into account that the treatment will also be dependent on availability of resources and existing treatment guidelines, considered by other Pain Management Practitioners as being common knowledge and practice, at the time of the intervention. For Medico-Legal purposes, it is also important to point out that variation in procedural techniques and pharmacological choices are the acceptable norm. The indications, contraindications, technique, and results of the above procedure should only be interpreted and judged by a Board-Certified Interventional Pain Specialist with extensive familiarity and expertise in the same exact procedure and technique.

## 2018-09-17 NOTE — Patient Instructions (Signed)

## 2018-09-18 ENCOUNTER — Telehealth: Payer: Self-pay | Admitting: *Deleted

## 2018-09-18 ENCOUNTER — Encounter: Payer: BLUE CROSS/BLUE SHIELD | Admitting: Student in an Organized Health Care Education/Training Program

## 2018-09-18 NOTE — Telephone Encounter (Signed)
Attempted to call for post procedure follow-up. No answer, unable to leave a message. 

## 2018-10-01 ENCOUNTER — Telehealth: Payer: Self-pay

## 2018-10-01 NOTE — Telephone Encounter (Signed)
Reviewed chart. This seems to be contradictory to what she told me on 09/12/18, said she was using Anoro with good results.  Regardless, she should try sample Anoro first for 7 days then go pick up rx at pharmacy, let us know if need new rx  [name], [facility] 10/01/2018, 10:35 AM

## 2018-10-01 NOTE — Telephone Encounter (Signed)
Pt called complaining of persistent SOB with exertion. She informed me that she was only using the Proair inhaler because she never got the Anoro inhaler form the pharmacy. She states that they never notified her that it was ready for pick up. I contacted the pharmacy and left a detail message on the vm concerning the Anoro inhaler prescription. I also recommended that the patient call to check on when the prescription will be available. She verbalize understanding.

## 2018-10-01 NOTE — Telephone Encounter (Signed)
The pt informed me that she thought the Proair inhaler was the Anoro. She contacted her pharmacy and the prescription was ready for pick-up.

## 2018-11-06 ENCOUNTER — Ambulatory Visit (INDEPENDENT_AMBULATORY_CARE_PROVIDER_SITE_OTHER): Payer: Medicare Other | Admitting: Nurse Practitioner

## 2018-11-06 ENCOUNTER — Encounter: Payer: Self-pay | Admitting: Nurse Practitioner

## 2018-11-06 ENCOUNTER — Other Ambulatory Visit: Payer: Self-pay

## 2018-11-06 VITALS — BP 126/85 | HR 88 | Resp 22 | Ht 69.0 in | Wt 241.0 lb

## 2018-11-06 DIAGNOSIS — E890 Postprocedural hypothyroidism: Secondary | ICD-10-CM | POA: Diagnosis not present

## 2018-11-06 DIAGNOSIS — R0602 Shortness of breath: Secondary | ICD-10-CM | POA: Diagnosis not present

## 2018-11-06 DIAGNOSIS — R632 Polyphagia: Secondary | ICD-10-CM

## 2018-11-06 DIAGNOSIS — R7309 Other abnormal glucose: Secondary | ICD-10-CM

## 2018-11-06 DIAGNOSIS — R635 Abnormal weight gain: Secondary | ICD-10-CM | POA: Diagnosis not present

## 2018-11-06 DIAGNOSIS — J441 Chronic obstructive pulmonary disease with (acute) exacerbation: Secondary | ICD-10-CM

## 2018-11-06 MED ORDER — MONTELUKAST SODIUM 10 MG PO TABS: 10.0000 mg | ORAL_TABLET | Freq: Every day | ORAL | 3 refills | Status: DC

## 2018-11-06 NOTE — Progress Notes (Signed)
Subjective:    Patient ID: Pamela Romero, female    DOB: May 26, 1950, 68 y.o.   MRN: [id number]  Pamela Romero is a 68 y.o. female presenting on 11/06/2018 for Shortness of Breath (persistent SOB, weight gain and sevee intermittent cramps )   HPI Shortness of Breath Patient has stopped smoking.  She has continued being stopped smoking for last 7 months. - Patient is limited in travel out of the home.  Patient goes to get groceries at [employer] and is not going anywhere else.  Wants to see Aunt in [place], [place] but she is not willing to travel at this time.  Little interest in activites, when she is active she must take frequent rest breaks.  She has started her maintenance medications as prescribed Dr. Parks Ranger on 09/12/2018.  She has not yet seen pulmonology notes initial exacerbation.  Weight gain Increased appetite is noted over the last several months.  Patient is concerned about the rate of weight gain. - Patient has been off her levothyroxine for about 1-2 weeks, which is a possible and probable cause of her weight gain.   Cramps Patient is noting leg cramps intermittently throughout the day.  There is nothing that seems to make them better.  They go away on their own.  Frequency of leg cramps is increasing. Patient has no other known causes of increased cramps.  Social History   Tobacco Use  . Smoking status: Former Smoker    Packs/day: 0.50    Years: 50.00    Pack years: 25.00    Quit date: 05/23/2018    Years since quitting: 0.4  . Smokeless tobacco: Never Used  . Tobacco comment: 2-3 cigs a day  Substance Use Topics  . Alcohol use: No  . Drug use: No    Review of Systems Per HPI unless specifically indicated above     Objective:    BP 126/85 (BP Location: Right Arm, Patient Position: Sitting, Cuff Size: Normal)   Pulse 88   Resp (!) 22   Ht 5\' 9"  (1.753 m)   Wt 241 lb (109.3 kg)   SpO2 99%   BMI 35.59 kg/m   Wt Readings from Last 3 Encounters:  11/06/18  241 lb (109.3 kg)  09/17/18 210 lb (95.3 kg)  06/27/18 210 lb (95.3 kg)    Physical Exam Vitals signs reviewed.  Constitutional:      General: She is not in acute distress.    Appearance: She is well-developed.  HENT:     Head: Normocephalic and atraumatic.  Cardiovascular:     Rate and Rhythm: Normal rate and regular rhythm.     Pulses:          Radial pulses are 2+ on the right side and 2+ on the left side.       Posterior tibial pulses are 1+ on the right side and 1+ on the left side.     Heart sounds: Normal heart sounds, S1 normal and S2 normal.  Pulmonary:     Effort: Pulmonary effort is normal. No respiratory distress.     Breath sounds: Decreased air movement present. Wheezing (throughout all lobes) and rhonchi present.  Musculoskeletal:     Right lower leg: No edema.     Left lower leg: No edema.  Skin:    General: Skin is warm and dry.     Capillary Refill: Capillary refill takes less than 2 seconds.  Neurological:     Mental Status: She is alert  and oriented to person, place, and time.  Psychiatric:        Attention and Perception: Attention normal.        Mood and Affect: Mood and affect normal.        Behavior: Behavior normal. Behavior is cooperative.      Results for orders placed or performed during the hospital encounter of 09/13/18  Novel Coronavirus, NAA (hospital order; send-out to ref lab)   Specimen: Nasopharyngeal Swab; Respiratory  Result Value Ref Range   SARS-CoV-2, NAA NOT DETECTED NOT DETECTED   Coronavirus Source NASOPHARYNGEAL       Assessment & Plan:   Problem List Items Addressed This Visit      Endocrine   Hypothyroidism   Relevant Orders   TSH (Completed)   T4, free (Completed)     Other   Shortness of breath   Relevant Medications   montelukast (SINGULAIR) 10 MG tablet   Other Relevant Orders   CBC with Differential/Platelet (Completed)   COMPLETE METABOLIC PANEL WITH GFR (Completed)    Other Visit Diagnoses    Chronic  obstructive pulmonary disease with acute exacerbation (HCC)    -  Primary   Relevant Medications   montelukast (SINGULAIR) 10 MG tablet   Other Relevant Orders   Pulmonary Function Test [facility] Only   Ambulatory referral to Pulmonology   CBC with Differential/Platelet (Completed)   Weight gain       Relevant Orders   TSH (Completed)   T4, free (Completed)   COMPLETE METABOLIC PANEL WITH GFR (Completed)   Polyphagia       Elevated glucose       Relevant Orders   Hemoglobin A1c      Shortness of breath, COPD Uncontrolled symptoms with significantly increased shortness of breath over last 2 months. Not resolving with initial inhaler therapies. Severity not yet established.  Plan: 1. PFT ordered 2. Referral pulmonology 3. Continue Anoro, Proair 4. Start montelukast 5. Labs to evaluate metabolic cause, anemia (normal) 6. Follow-up 3 months and prn.  Weight gain, Hypothyroidism Specific cause unknown, however, patient has had uncontrolled postsurgical hypothyroidism and was lost to followup after last visit with me.   Plan: 1. Screen diabetes with polyphagia, weight gain 2. Recheck TSH, FreeT4 - resume levothyroxine 125 mcg once daily 3. Encouraged patient to evaluate diet and lifestyle.  Participate in physical activity as tolerated. 4. Follow-up 4 weeks  Meds ordered this encounter  Medications  . montelukast (SINGULAIR) 10 MG tablet    Sig: Take 1 tablet (10 mg total) by mouth at bedtime.    Dispense:  30 tablet    Refill:  3    Order Specific Question:   Supervising Provider    Answer:   Olin Hauser [[id number]]    Follow up plan: Return in about 4 weeks (around 12/04/2018) for shortness of breath or stress.  Cassell Smiles, DNP, AGPCNP-BC Adult Gerontology Primary Care Nurse Practitioner [facility] 11/06/2018, 11:39 AM

## 2018-11-06 NOTE — Patient Instructions (Addendum)
Pamela Romero,   Thank you for coming in to clinic today.  1. START montelukast 10 mg once daily  2. Continue inhalers  3.  You will get a call for lung function testing  4. Pulmonology appointment - they will call you  5. Lab results will be back to you this week.  6. Call to have a telephone visit or in person for your stress in the next 4-6 weeks or sooner if needed.  Please schedule a follow-up appointment with Cassell Smiles, AGNP. Return in about 4 weeks (around 12/04/2018) for shortness of breath or stress.  If you have any other questions or concerns, please feel free to call the clinic or send a message through [place]. You may also schedule an earlier appointment if necessary.  You will receive a survey after today's visit either digitally by e-mail or paper by [employer]. Your experiences and feedback matter to [place].  Please respond so we know how we are doing as we provide care for you.   Cassell Smiles, DNP, AGNP-BC Adult Gerontology Nurse Practitioner [place]

## 2018-11-07 ENCOUNTER — Telehealth: Payer: Self-pay | Admitting: Pulmonary Disease

## 2018-11-07 LAB — COMPLETE METABOLIC PANEL WITH GFR
AG Ratio: 1.5 (calc) (ref 1.0–2.5)
ALT: 6 U/L (ref 6–29)
AST: 16 U/L (ref 10–35)
Albumin: 4 g/dL (ref 3.6–5.1)
Alkaline phosphatase (APISO): 48 U/L (ref 37–153)
BUN/Creatinine Ratio: 13 (calc) (ref 6–22)
BUN: 16 mg/dL (ref 7–25)
CO2: 31 mmol/L (ref 20–32)
Calcium: 8.9 mg/dL (ref 8.6–10.4)
Chloride: 101 mmol/L (ref 98–110)
Creat: 1.26 mg/dL — ABNORMAL HIGH (ref 0.50–0.99)
GFR, Est African American: 51 mL/min/{1.73_m2} — ABNORMAL LOW (ref >=60)
GFR, Est Non African American: 44 mL/min/{1.73_m2} — ABNORMAL LOW (ref >=60)
Globulin: 2.7 g/dL (calc) (ref 1.9–3.7)
Glucose, Bld: 81 mg/dL (ref 65–99)
Potassium: 3.8 mmol/L (ref 3.5–5.3)
Sodium: 141 mmol/L (ref 135–146)
Total Bilirubin: 0.4 mg/dL (ref 0.2–1.2)
Total Protein: 6.7 g/dL (ref 6.1–8.1)

## 2018-11-07 LAB — HEMOGLOBIN A1C
Hgb A1c MFr Bld: 5.1 % of total Hgb (ref <5.7)
Mean Plasma Glucose: 100 (calc)
eAG (mmol/L): 5.5 (calc)

## 2018-11-07 LAB — CBC WITH DIFFERENTIAL/PLATELET
Absolute Monocytes: 319 cells/uL (ref 200–950)
Basophils Absolute: 18 cells/uL (ref 0–200)
Basophils Relative: 0.3 %
Eosinophils Absolute: 59 cells/uL (ref 15–500)
Eosinophils Relative: 1 %
HCT: 38.2 % (ref 35.0–45.0)
Hemoglobin: 12.9 g/dL (ref 11.7–15.5)
Lymphs Abs: 1345 cells/uL (ref 850–3900)
MCH: 33.6 pg — ABNORMAL HIGH (ref 27.0–33.0)
MCHC: 33.8 g/dL (ref 32.0–36.0)
MCV: 99.5 fL (ref 80.0–100.0)
MPV: 10.5 fL (ref 7.5–12.5)
Monocytes Relative: 5.4 %
Neutro Abs: 4160 cells/uL (ref 1500–7800)
Neutrophils Relative %: 70.5 %
Platelets: 147 10*3/uL (ref 140–400)
RBC: 3.84 10*6/uL (ref 3.80–5.10)
RDW: 13.9 % (ref 11.0–15.0)
Total Lymphocyte: 22.8 %
WBC: 5.9 10*3/uL (ref 3.8–10.8)

## 2018-11-07 LAB — TSH: TSH: 71.3 mIU/L — ABNORMAL HIGH (ref 0.40–4.50)

## 2018-11-07 LAB — T4, FREE: Free T4: 0.2 ng/dL — ABNORMAL LOW (ref 0.8–1.8)

## 2018-11-07 NOTE — Telephone Encounter (Signed)
Called patient for COVID-19 pre-screening for in office visit.  Have you recently traveled any where out of the local area in the last 2 weeks? No  Have you been in close contact with a person diagnosed with COVID-19 or someone awaiting results within the last 2 weeks? Np  Do you currently have any of the following symptoms? If so, when did they start? Cough     Diarrhea            Joint Pain Fever      Muscle Pain  Red eyes Shortness of breath (Yes- couple weeks)  Abdominal pain            Vomiting Loss of smell    Rash   Sore Throat Headache    Weakness  Bruising or bleeding  Congestion  Okay to proceed with visit. (date)  / Needs to reschedule visit. (date)

## 2018-11-07 NOTE — Telephone Encounter (Signed)
Okay to proceed with visit.

## 2018-11-08 ENCOUNTER — Encounter: Payer: Self-pay | Admitting: Student in an Organized Health Care Education/Training Program

## 2018-11-08 NOTE — Progress Notes (Signed)
Medications to run out on 11/28/18 Hydrocodone - apap 7.5-325 mg.

## 2018-11-09 ENCOUNTER — Encounter: Payer: Self-pay | Admitting: Pulmonary Disease

## 2018-11-09 ENCOUNTER — Encounter: Payer: Self-pay | Admitting: Nurse Practitioner

## 2018-11-09 ENCOUNTER — Other Ambulatory Visit: Payer: Self-pay | Admitting: Nurse Practitioner

## 2018-11-09 ENCOUNTER — Ambulatory Visit (INDEPENDENT_AMBULATORY_CARE_PROVIDER_SITE_OTHER): Payer: Medicare Other | Admitting: Pulmonary Disease

## 2018-11-09 ENCOUNTER — Other Ambulatory Visit: Payer: Self-pay

## 2018-11-09 ENCOUNTER — Ambulatory Visit
Admission: RE | Admit: 2018-11-09 | Discharge: 2018-11-09 | Disposition: A | Discharge status: Home / Self Care | Payer: Medicare Other | Source: Ambulatory Visit | Attending: Pulmonary Disease | Admitting: Pulmonary Disease

## 2018-11-09 VITALS — BP 130/78 | HR 86 | Temp 98.1°F | Ht 68.5 in | Wt 239.6 lb

## 2018-11-09 DIAGNOSIS — E669 Obesity, unspecified: Secondary | ICD-10-CM | POA: Diagnosis not present

## 2018-11-09 DIAGNOSIS — E039 Hypothyroidism, unspecified: Secondary | ICD-10-CM

## 2018-11-09 DIAGNOSIS — R0602 Shortness of breath: Secondary | ICD-10-CM | POA: Insufficient documentation

## 2018-11-09 DIAGNOSIS — R0600 Dyspnea, unspecified: Secondary | ICD-10-CM

## 2018-11-09 DIAGNOSIS — E890 Postprocedural hypothyroidism: Secondary | ICD-10-CM

## 2018-11-09 MED ORDER — LEVOTHYROXINE SODIUM 125 MCG PO TABS: ORAL_TABLET | ORAL | 3 refills | Status: DC

## 2018-11-09 MED ORDER — TRELEGY ELLIPTA 100-62.5-25 MCG/INH IN AEPB: 1.0000 | INHALATION_SPRAY | Freq: Every day | RESPIRATORY_TRACT | 0 refills | Status: DC

## 2018-11-09 NOTE — Patient Instructions (Addendum)
1.  You have already been set up for breathing test by her primary practitioner.  We will wait for those results.  2.  We will obtain a chest x-ray to further determine the reason for your shortness of breath.  3.  We will also get an echocardiogram, this is a heart test to make sure that your heart is okay and not part of your shortness of breath.  4.  You have been instructed how to use the inhalers that you currently have.  I would like for you to hold off on the Anoro (the once a day inhaler) and I am going to give you a trial of Trelegy which is the next step up from the Anoro.  If you find that the Trelegy works better, do let us know so we can change her prescription from Anoro to Trelegy.  Do not use both inhalers at the same time.  5.  We will see you in follow-up in 4 to 6 weeks time call us sooner should any new difficulties arise.  6.  Thank you for trusting [place] with your care.

## 2018-11-09 NOTE — Progress Notes (Signed)
Subjective:    Patient ID: Pamela Romero, female    DOB: 05/06/1950, 68 y.o.   MRN: [id number]  HPI This is a 68 year old former smoker (January 2020) who presents for evaluation and management of dyspnea (shortness of breath).  She is kindly referred by Cassell Smiles, NP.  Patient notes that she started having some difficulties around January 2020 with increasing dyspnea and fatigue.  He does not engage in smoking cessation.  She then developed issues with worsening dyspnea from February 2020 on.  She has issues with hypothyroidism and has had prior partial thyroidectomy in the past.  Of note her February 2020 TSH was in the 29s.  She apparently had issues with her primary care physician at that time and could not get her thyroid medication so she had discontinued taking it.  Most recently she switch practitioners and her most recent TSH which was obtained on 28 July is in the 70s.  The patient was placed on Anoro Ellipta she states that she has been taking this for approximately 2 weeks.  She continues to have issues with with dyspnea and despite using Anoro.  She has a rescue inhaler however is not certain of how to use it.  She also has had some worsening of gastroesophageal reflux symptoms.  She does not describe orthopnea or paroxysmal nocturnal dyspnea.  No fever, chills or sweats.  She has occasional cough but is usually dry.  No sputum production or hemoptysis.  She does feel chest tightness when she is short of breath but no chest pain per se.  Past medical history, surgical history, family history, have been reviewed and are as noted.  Social history, she smoked half to 1 pack of cigarettes per day since age 68 until January 2020.  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  Review of Systems  Constitutional: Positive for fatigue.  HENT: Positive for voice change.   Eyes: Negative.   Respiratory: Positive for cough, chest tightness, shortness of breath and wheezing.    Cardiovascular: Negative.   Gastrointestinal:       Significant gastroesophageal reflux symptoms  Endocrine: Positive for cold intolerance.  Genitourinary: Negative.   Musculoskeletal: Negative.   Skin: Negative.   Allergic/Immunologic: Negative.   Neurological: Negative.   Hematological: Negative.   Psychiatric/Behavioral: Negative.   All other systems reviewed and are negative.      Objective:   Physical Exam Vitals signs and nursing note reviewed.  Constitutional:      Appearance: Normal appearance. She is obese.  HENT:     Head: Normocephalic and atraumatic.     Right Ear: External ear normal.     Left Ear: External ear normal.     Nose:     Comments: Nose/mouth/throat not examined due to masking requirements for COVID 19. Eyes:     General: No scleral icterus.    Conjunctiva/sclera: Conjunctivae normal.     Pupils: Pupils are equal, round, and reactive to light.  Neck:     Musculoskeletal: Neck supple.     Thyroid: No thyromegaly.     Trachea: Trachea normal.     Comments: Hoarseness noted Cardiovascular:     Rate and Rhythm: Normal rate and regular rhythm.     Pulses: Normal pulses.     Heart sounds: Normal heart sounds.  Pulmonary:     Effort: Accessory muscle usage present.     Breath sounds: Normal air entry. No wheezing, rhonchi or rales.  Comments: Coarse breath sounds Chest:     Chest wall: Deformity (Increased AP diameter) present.  Abdominal:     General: Abdomen is protuberant. There is no distension.  Musculoskeletal: Normal range of motion.     Right lower leg: No edema.     Left lower leg: No edema.  Lymphadenopathy:     Cervical: No cervical adenopathy.  Skin:    General: Skin is warm and dry.  Neurological:     General: No focal deficit present.     Mental Status: She is alert and oriented to person, place, and time.  Psychiatric:        Mood and Affect: Mood normal.        Behavior: Behavior normal.    Ambulatory oximetry was  performed today she did not desaturate with ambulation.  Maintained saturations of 93% or better.  Heart rate not higher than 119.  There are no imaging studies to review.     Assessment & Plan:   1.  Dyspnea: I suspect that this is multifactorial.  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  I agree with pulmonary function testing that has already been ordered.  We will get chest x-ray to further evaluate for reasons for dyspnea be at hyperinflation, significant COPD, or structural abnormality.  This may determine whether we need to do further imaging.  Another possibility could be cardiac etiology.  2D echo will be ordered to evaluate this.  This in particular since the patient did not have any issues with desaturations during exercise.  Additionally, dyspnea can be aggravated by obesity but most importantly by her hypothyroidism.  She has severe hypothyroidism.  She needs to start supplementation which she has not done.  She is to pick up her medication today.  Instructions were given as below.  We will see her in follow-up in 4 to 6 weeks time.  She is to contact us prior to that time should any new difficulties arise.  2.  Hypothyroidism: TSH was 71.3 and free T4 0.2 on most recent study of 28 July the patient has severe hypothyroidism on the verge of myxedema.  She was instructed to take half of her Synthroid dose daily for a week and then increase to her full tablet which will be 125 mcg.  In February when she started having symptoms her TSH was 51.  She had lost follow-up with her primary physician and was not taking supplementation.  This issue likely adds to the patient's fatigue and shortness of breath.  3.  Obesity class II: This issue adds complexity to her management.  Current BMI is 35.9.  This is an additional issue to aggravate her dyspnea.  Thank you for allowing [place] to participate in this patient's care.   This chart was  dictated using voice recognition software/Dragon.  Despite best efforts to proofread, errors can occur which can change the meaning.  Any change was purely unintentional.

## 2018-11-09 NOTE — Addendum Note (Signed)
Addended by: Maryanna Shape A on: 11/09/2018 03:25 PM   Modules accepted: Orders

## 2018-11-12 ENCOUNTER — Other Ambulatory Visit: Payer: Self-pay

## 2018-11-12 ENCOUNTER — Ambulatory Visit
Payer: Medicare Other | Attending: Student in an Organized Health Care Education/Training Program | Admitting: Student in an Organized Health Care Education/Training Program

## 2018-11-12 DIAGNOSIS — Z79899 Other long term (current) drug therapy: Secondary | ICD-10-CM

## 2018-11-12 DIAGNOSIS — M1712 Unilateral primary osteoarthritis, left knee: Secondary | ICD-10-CM

## 2018-11-12 DIAGNOSIS — G894 Chronic pain syndrome: Secondary | ICD-10-CM | POA: Diagnosis not present

## 2018-11-12 DIAGNOSIS — M170 Bilateral primary osteoarthritis of knee: Secondary | ICD-10-CM | POA: Diagnosis not present

## 2018-11-12 DIAGNOSIS — M722 Plantar fascial fibromatosis: Secondary | ICD-10-CM | POA: Diagnosis not present

## 2018-11-12 DIAGNOSIS — Z79891 Long term (current) use of opiate analgesic: Secondary | ICD-10-CM

## 2018-11-12 MED ORDER — HYDROCODONE-ACETAMINOPHEN 7.5-325 MG PO TABS: 1.0000 | ORAL_TABLET | Freq: Four times a day (QID) | ORAL | 0 refills | Status: DC | PRN

## 2018-11-12 NOTE — Progress Notes (Signed)
Pain Management Virtual Encounter Note - Virtual Visit via Telephone Telehealth (real-time audio visits between healthcare provider and patient).   Patient's Phone No. & Preferred Pharmacy:  [phone] (home); [phone] (mobile); (Preferred) [phone] No e-mail address on record  [id number] - [place], [place] - [address] [place] [address] Phone: [phone] Fax: [phone]  [facility] [place], [place] [address] [place] [address] Phone: [phone] Fax: [phone]    Pre-screening note:  Our staff contacted Pamela Romero and offered her an "in person", "face-to-face" appointment versus a telephone encounter. She indicated preferring the telephone encounter, at this time.   Reason for Virtual Visit: COVID-19*  Social distancing based on CDC and AMA recommendations.   I contacted Pamela Romero on 11/12/2018 via telephone.      I clearly identified myself as Gillis Santa, MD. I verified that I was speaking with the correct person using two identifiers (Name: Pamela Romero, and date of birth: [birth year]).  Advanced Informed Consent I sought verbal advanced consent from Pamela Romero for virtual visit interactions. I informed Pamela Romero of possible security and privacy concerns, risks, and limitations associated with providing "not-in-person" medical evaluation and management services. I also informed Pamela Romero of the availability of "in-person" appointments. Finally, I informed her that there would be a charge for the virtual visit and that she could be  personally, fully or partially, financially responsible for it. Pamela Romero expressed understanding and agreed to proceed.   Historic Elements   Pamela Romero is a 68 y.o. year old, female patient evaluated today after her last encounter by our practice on 09/18/2018. Pamela Romero  has a past medical history of Abnormal EKG, CAD  (coronary artery disease), Cancer ([place]), Endometrial adenocarcinoma ([place]) (2011), Hyperlipidemia, Hypertension, Osteoarthritis, Reflux gastritis, Thyroid disease, tobacco abuse, and Vaginal delivery. She also  has a past surgical history that includes Thyroidectomy (2005); Cardiac catheterization (2006); Total abdominal hysterectomy; Breast surgery (Right, March 2014); and Breast cyst aspiration (Right). Pamela Romero has a current medication list which includes the following prescription(s): docusate sodium, esomeprazole, hydrocodone-acetaminophen, hydrocodone-acetaminophen, lisinopril, montelukast, polyethylene glycol powder, proair hfa, umeclidinium-vilanterol, trelegy ellipta, and levothyroxine. She  reports that she quit smoking about 6 months ago. She has a 25.00 pack-year smoking history. She has never used smokeless tobacco. She reports that she does not drink alcohol or use drugs. Pamela Romero has No Known Allergies.   HPI  Today, she is being contacted for both, medication management and a post-procedure assessment.   Evaluation of last interventional procedure  09/17/2018 Procedure: LEFT KNEE STEROID INJECTION #2 Pre-procedure pain score:  8/10 Post-procedure pain score: 0/10         Influential Factors: Intra-procedural challenges: None observed.         Reported side-effects: None.        Post-procedural adverse reactions or complications: None reported         Sedation: Please see nurses note for DOS. When no sedatives are used, the analgesic levels obtained are directly associated to the effectiveness of the local anesthetics. However, when sedation is provided, the level of analgesia obtained during the initial 1 hour following the intervention, is believed to be the result of a combination of factors. These factors may include, but are not limited to: 1. The effectiveness of the local anesthetics used. 2. The effects of the analgesic(s) and/or anxiolytic(s) used. 3. The  degree of  discomfort experienced by the patient at the time of the procedure. 4. The patients ability and reliability in recalling and recording the events. 5. The presence and influence of possible secondary gains and/or psychosocial factors. Reported result: Relief experienced during the 1st hour after the procedure: 100 % (Ultra-Short Term Relief)            Interpretative annotation: Clinically appropriate result. Analgesia during this period is likely to be Local Anesthetic and/or IV Sedative (Analgesic/Anxiolytic) related.          Effects of local anesthetic: The analgesic effects attained during this period are directly associated to the localized infiltration of local anesthetics and therefore cary significant diagnostic value as to the etiological location, or anatomical origin, of the pain. Expected duration of relief is directly dependent on the pharmacodynamics of the local anesthetic used. Long-acting (4-6 hours) anesthetics used.  Reported result: Relief during the next 4 to 6 hour after the procedure: 100 % (Short-Term Relief)            Interpretative annotation: Clinically appropriate result. Analgesia during this period is likely to be Local Anesthetic-related.          Long-term benefit: Defined as the period of time past the expected duration of local anesthetics (1 hour for short-acting and 4-6 hours for long-acting). With the possible exception of prolonged sympathetic blockade from the local anesthetics, benefits during this period are typically attributed to, or associated with, other factors such as analgesic sensory neuropraxia, antiinflammatory effects, or beneficial biochemical changes provided by agents other than the local anesthetics.  Reported result: Extended relief following procedure: 60 %(patient states she has different days that it hurts more.  could be based on activity.) (Long-Term Relief)            Interpretative annotation: Clinically appropriate result. Good relief. No  permanent benefit expected. Inflammation plays a part in the etiology to the pain.          Pharmacotherapy Assessment   10/29/2018  1   09/17/2018  Hydrocodone-Acetamin 7.5-325  105.00 30 Bi Lat   [id number]   [place] (1409)   0  26.25 MME  Private Pay   [place]    Monitoring: Pharmacotherapy: No side-effects or adverse reactions reported. [place] PMP: PDMP reviewed during this encounter.       Compliance: No problems identified. Effectiveness: Clinically acceptable. Plan: Refer to "POC".  UDS:  Summary  Date Value Ref Range Status  08/30/2017 FINAL  Final    Comment:    ==================================================================== [facility] COMP DRUG ANALYSIS,UR ==================================================================== Test                             Result       Flag       Units Drug Present and Declared for Prescription Verification   Tramadol                       >1479        EXPECTED   ng/mg creat   O-Desmethyltramadol            >1479        EXPECTED   ng/mg creat   N-Desmethyltramadol            >1479        EXPECTED   ng/mg creat    Source of tramadol is a prescription medication.    O-desmethyltramadol and N-desmethyltramadol  are expected    metabolites of tramadol.   Diclofenac                     PRESENT      EXPECTED Drug Present not Declared for Prescription Verification   Acetaminophen                  PRESENT      UNEXPECTED Drug Absent but Declared for Prescription Verification   Duloxetine                     Not Detected UNEXPECTED ==================================================================== Test                      Result    Flag   Units      Ref Range   Creatinine              338              mg/dL      >=20 ==================================================================== Declared Medications:  The flagging and interpretation on this report are based on the  following declared medications.  Unexpected results may arise from   inaccuracies in the declared medications.  **Note: The testing scope of this panel includes these medications:  Duloxetine  Tramadol  **Note: The testing scope of this panel does not include small to  moderate amounts of these reported medications:  Diclofenac  **Note: The testing scope of this panel does not include following  reported medications:  Docusate (Colace)  Hydrochlorothiazide (Zestoretic)  Levothyroxine (Synthroid)  Lisinopril (Zestoretic)  Omeprazole (Nexium)  Ranitidine  Rosuvastatin ==================================================================== For clinical consultation, please call [phone]. ====================================================================    Laboratory Chemistry Profile (12 mo)  Renal: 11/06/2018: BUN 16; BUN/Creatinine Ratio 13; Creat 1.26; GFR, Est African American 51; GFR, Est Non African American 44  Hepatic: 11/06/2018: ALT 6; AST 16 Other: No results found for requested labs within last 8760 hours. Note: Above Lab results reviewed.  Imaging  Last 90 days:  Dg Chest 2 View  Result Date: 11/09/2018 CLINICAL DATA:  Shortness of breath for 4 months EXAM: CHEST - 2 VIEW COMPARISON:  February 09, 2017 FINDINGS: The heart size and mediastinal contours are stable. The aorta is tortuous. There is no focal infiltrate, pulmonary edema, or pleural effusion. Mild linear scar or atelectasis is noted in both lung bases. The visualized skeletal structures are stable. IMPRESSION: Mild atelectasis or scar of both lung bases, stable compared prior exam. No focal pneumonia noted. Electronically Signed   By: Abelardo Diesel M.D.   On: 11/09/2018 15:19   Last Hospital Admission:  Dg Chest 2 View  Result Date: 11/09/2018 CLINICAL DATA:  Shortness of breath for 4 months EXAM: CHEST - 2 VIEW COMPARISON:  February 09, 2017 FINDINGS: The heart size and mediastinal contours are stable. The aorta is tortuous. There is no focal infiltrate, pulmonary edema, or  pleural effusion. Mild linear scar or atelectasis is noted in both lung bases. The visualized skeletal structures are stable. IMPRESSION: Mild atelectasis or scar of both lung bases, stable compared prior exam. No focal pneumonia noted. Electronically Signed   By: Abelardo Diesel M.D.   On: 11/09/2018 15:19   Assessment  The primary encounter diagnosis was Primary osteoarthritis of left knee. Diagnoses of Chronic pain syndrome, Bilateral primary osteoarthritis of knee, Plantar fascia syndrome, Chronic use of opiate for therapeutic purpose, and Pharmacologic therapy were also pertinent to this visit.  Plan of  Care  I have changed Pamela M. Davisson's HYDROcodone-acetaminophen. I am also having her start on HYDROcodone-acetaminophen. Additionally, I am having her maintain her docusate sodium, esomeprazole, lisinopril, ProAir HFA, umeclidinium-vilanterol, polyethylene glycol powder, and montelukast.  Pharmacotherapy (Medications Ordered): Meds ordered this encounter  Medications  . HYDROcodone-acetaminophen (NORCO) 7.5-325 MG tablet    Sig: Take 1 tablet by mouth every 6 (six) hours as needed for severe pain. Must last 30 days.    Dispense:  105 tablet    Refill:  0    [place] STOP ACT - Not applicable. Fill one day early if pharmacy is closed on scheduled refill date.  [name] HYDROcodone-acetaminophen (NORCO) 7.5-325 MG tablet    Sig: Take 1 tablet by mouth every 6 (six) hours as needed for severe pain. Must last 30 days.    Dispense:  105 tablet    Refill:  0    [place] STOP ACT - Not applicable. Fill one day early if pharmacy is closed on scheduled refill date.   Orders:  Orders Placed This Encounter  Procedures  . KNEE INJECTION    Local Anesthetic & Steroid injection.    Standing Status:   Future    Standing Expiration Date:   12/12/2018    Scheduling Instructions:     Side: LEFT #3     Sedation: None     Timeframe: As soon as schedule allows    Order Specific Question:   Where will this procedure be  performed?    Answer:   [facility]  . ToxASSURE Select 13 (MW), Urine    Volume: 30 ml(s). Minimum 3 ml of urine is needed. Document temperature of fresh sample. Indications: Long term (current) use of opiate analgesic (N23.557)   Follow-up plan:   Return for Procedure left knee steroid injection #3 without sedation.     Status post left genicular RFA 04/16/2018, status post right genicular RFA 03/12/2018.  Provided her with benefit.  Can consider repeating.  Also obtain benefit with left intra-articular knee steroid injection.  Plan for #3.   Recent Visits Date Type Provider Dept  09/17/18 Procedure visit Gillis Santa, MD [facility]  Showing recent visits within past 90 days and meeting all other requirements   Today's Visits Date Type Provider Dept  11/12/18 Office Visit Gillis Santa, MD [facility]  Showing today's visits and meeting all other requirements   Future Appointments No visits were found meeting these conditions.  Showing future appointments within next 90 days and meeting all other requirements   I discussed the assessment and treatment plan with the patient. The patient was provided an opportunity to ask questions and all were answered. The patient agreed with the plan and demonstrated an understanding of the instructions.  Patient advised to call back or seek an in-person evaluation if the symptoms or condition worsens.  Total duration of non-face-to-face encounter: 25 minutes.  Note by: Gillis Santa, MD Date: 11/12/2018; Time: 11:37 AM  Note: This dictation was prepared with Dragon dictation. Any transcriptional errors that may result from this process are unintentional.  Disclaimer:  * Given the special circumstances of the COVID-19 pandemic, the federal government has announced that the Office for Civil Rights (OCR) will exercise its enforcement discretion and will not impose penalties on physicians using telehealth in the event of  noncompliance with regulatory requirements under the [place] and [place] (HIPAA) in connection with the good faith provision of telehealth during the DUKGU-54 national public health emergency. (  AMA)

## 2018-11-16 ENCOUNTER — Ambulatory Visit: Payer: Medicare Other

## 2018-11-19 ENCOUNTER — Telehealth: Payer: Self-pay | Admitting: Nurse Practitioner

## 2018-11-19 DIAGNOSIS — E890 Postprocedural hypothyroidism: Secondary | ICD-10-CM

## 2018-11-19 NOTE — Telephone Encounter (Signed)
Patient continues to have shortness of breath, feelings of being very cold.  She is able to warm up when she goes outside and HR continues to be in 90s.  She is finished today with her 1/2 tablet week and will resume levothyroxine 125 mcg once daily beginning 11/20/2018. - She fell 2 days ago and reports general soreness, no injury.  No loss of consciousness.  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  Educated patient on signs and symptoms. - Recheck free T4 now.  Continue plan to repeat TSH, Free T4 around 12/10/2018. - Anxiety about health also contributing factor to shortness of breath. - Patient verbalizes understanding of plan today.

## 2018-11-21 ENCOUNTER — Other Ambulatory Visit: Payer: Self-pay

## 2018-11-21 ENCOUNTER — Encounter: Payer: Self-pay | Admitting: Student in an Organized Health Care Education/Training Program

## 2018-11-21 ENCOUNTER — Ambulatory Visit
Payer: Medicare Other | Attending: Student in an Organized Health Care Education/Training Program | Admitting: Student in an Organized Health Care Education/Training Program

## 2018-11-21 VITALS — BP 135/104 | HR 88 | Temp 98.8°F | Resp 18 | Ht 68.5 in | Wt 220.0 lb

## 2018-11-21 DIAGNOSIS — M1712 Unilateral primary osteoarthritis, left knee: Secondary | ICD-10-CM | POA: Diagnosis not present

## 2018-11-21 MED ORDER — DEXAMETHASONE SODIUM PHOSPHATE 10 MG/ML IJ SOLN
INTRAMUSCULAR | Status: AC
  Filled 2018-11-21: qty 1

## 2018-11-21 MED ORDER — LIDOCAINE HCL 2 % IJ SOLN
INTRAMUSCULAR | Status: AC
  Filled 2018-11-21: qty 20

## 2018-11-21 MED ORDER — ROPIVACAINE HCL 2 MG/ML IJ SOLN
INTRAMUSCULAR | Status: AC
  Filled 2018-11-21: qty 10

## 2018-11-21 MED ORDER — ROPIVACAINE HCL 2 MG/ML IJ SOLN
2.0000 mL | Freq: Once | INTRAMUSCULAR | Status: AC
  Administered 2018-11-21: 10 mL via EPIDURAL

## 2018-11-21 MED ORDER — LIDOCAINE HCL 2 % IJ SOLN
20.0000 mL | Freq: Once | INTRAMUSCULAR | Status: AC
  Administered 2018-11-21: 400 mg

## 2018-11-21 MED ORDER — DEXAMETHASONE SODIUM PHOSPHATE 10 MG/ML IJ SOLN
10.0000 mg | Freq: Once | INTRAMUSCULAR | Status: AC
  Administered 2018-11-21: 10:00:00 10 mg

## 2018-11-21 NOTE — Progress Notes (Signed)
Patient's Name: Pamela Romero  MRN: [id number]  Referring Provider: Mikey College, *  DOB: 1951-03-18  PCP: Mikey College, NP  DOS: 11/21/2018  Note by: Gillis Santa, MD  Service setting: Ambulatory outpatient  Specialty: Interventional Pain Management  Patient type: Established  Location: [facility] (AMB) Pain Management Facility  Visit type: Interventional Procedure   Primary Reason for Visit: Interventional Pain Management Treatment. CC: Knee Pain (left)  Procedure:          Anesthesia, Analgesia, Anxiolysis:  Type: Therapeutic Intra-Articular Local anesthetic and steroid Knee Injection #3  Region: Infrapatellar Knee Region Level: Knee Joint Laterality: Left knee  Type: Local Anesthesia Indication(s): Analgesia         Local Anesthetic: Lidocaine 1-2% Route: Infiltration ([place]/IM) IV Access: Declined Sedation: Declined   Position: Sitting   Indications: 1. Primary osteoarthritis of left knee    Pain Score: Pre-procedure: 7 /10 Post-procedure: 2 /10   Previous knee injection 09/17/2018.  She states that this was beneficial for her left knee pain but patient did sustain a fall a couple of days ago and has bruised her knee primarily the medial portion.  Is having increased knee pain.  Will repeat knee steroid injection today.  Pre-op Assessment:  Pamela Romero is a 68 y.o. (year old), female patient, seen today for interventional treatment. She  has a past surgical history that includes Thyroidectomy (2005); Cardiac catheterization (2006); Total abdominal hysterectomy; Breast surgery (Right, March 2014); and Breast cyst aspiration (Right). Pamela Romero has a current medication list which includes the following prescription(s): docusate sodium, esomeprazole, trelegy ellipta, hydrocodone-acetaminophen, hydrocodone-acetaminophen, levothyroxine, lisinopril, montelukast, polyethylene glycol powder, proair hfa, and umeclidinium-vilanterol. Her primarily concern today is the Knee Pain  (left)  Initial Vital Signs:  Pulse/HCG Rate: 88  Temp: 98.8 F (37.1 C) Resp: 18 BP: (!) 135/104 SpO2: 97 %  BMI: Estimated body mass index is 32.96 kg/m as calculated from the following:   Height as of this encounter: 5' 8.5" (1.74 m).   Weight as of this encounter: 220 lb (99.8 kg).  Risk Assessment: Allergies: Reviewed. She has No Known Allergies.  Allergy Precautions: None required Coagulopathies: Reviewed. None identified.  Blood-thinner therapy: None at this time Active Infection(s): Reviewed. None identified. Pamela Romero is afebrile  Site Confirmation: Pamela Romero was asked to confirm the procedure and laterality before marking the site Procedure checklist: Completed Consent: Before the procedure and under the influence of no sedative(s), amnesic(s), or anxiolytics, the patient was informed of the treatment options, risks and possible complications. To fulfill our ethical and legal obligations, as recommended by the American Medical Association's Code of Ethics, I have informed the patient of my clinical impression; the nature and purpose of the treatment or procedure; the risks, benefits, and possible complications of the intervention; the alternatives, including doing nothing; the risk(s) and benefit(s) of the alternative treatment(s) or procedure(s); and the risk(s) and benefit(s) of doing nothing. The patient was provided information about the general risks and possible complications associated with the procedure. These may include, but are not limited to: failure to achieve desired goals, infection, bleeding, organ or nerve damage, allergic reactions, paralysis, and death. In addition, the patient was informed of those risks and complications associated to the procedure, such as failure to decrease pain; infection; bleeding; organ or nerve damage with subsequent damage to sensory, motor, and/or autonomic systems, resulting in permanent pain, numbness, and/or weakness of one  or several areas of the body; allergic reactions; (i.e.: anaphylactic reaction); and/or death.  Furthermore, the patient was informed of those risks and complications associated with the medications. These include, but are not limited to: allergic reactions (i.e.: anaphylactic or anaphylactoid reaction(s)); adrenal axis suppression; blood sugar elevation that in diabetics may result in ketoacidosis or comma; water retention that in patients with history of congestive heart failure may result in shortness of breath, pulmonary edema, and decompensation with resultant heart failure; weight gain; swelling or edema; medication-induced neural toxicity; particulate matter embolism and blood vessel occlusion with resultant organ, and/or nervous system infarction; and/or aseptic necrosis of one or more joints. Finally, the patient was informed that Medicine is not an exact science; therefore, there is also the possibility of unforeseen or unpredictable risks and/or possible complications that may result in a catastrophic outcome. The patient indicated having understood very clearly. We have given the patient no guarantees and we have made no promises. Enough time was given to the patient to ask questions, all of which were answered to the patient's satisfaction. Pamela Romero has indicated that she wanted to continue with the procedure. Attestation: I, the ordering provider, attest that I have discussed with the patient the benefits, risks, side-effects, alternatives, likelihood of achieving goals, and potential problems during recovery for the procedure that I have provided informed consent. Date  Time: 11/21/2018  9:27 AM  Pre-Procedure Preparation:  Monitoring: As per clinic protocol. Respiration, ETCO2, SpO2, BP, heart rate and rhythm monitor placed and checked for adequate function Safety Precautions: Patient was assessed for positional comfort and pressure points before starting the procedure. Time-out: I  initiated and conducted the "Time-out" before starting the procedure, as per protocol. The patient was asked to participate by confirming the accuracy of the "Time Out" information. Verification of the correct person, site, and procedure were performed and confirmed by me, the nursing staff, and the patient. "Time-out" conducted as per Joint Commission's Universal Protocol (UP.01.01.01). Time: 1015  Description of Procedure:          Target Area: Knee Joint Approach: Just above the Lateral tibial plateau, lateral to the infrapatellar tendon. Area Prepped: Entire knee area, from the mid-thigh to the mid-shin. Prepping solution: 37M DuraPrep (Iodine Povacrylex [0.7% available iodine] and Isopropyl Alcohol, 74% w/w) Safety Precautions: Aspiration looking for blood return was conducted prior to all injections. At no point did we inject any substances, as a needle was being advanced. No attempts were made at seeking any paresthesias. Safe injection practices and needle disposal techniques used. Medications properly checked for expiration dates. SDV (single dose vial) medications used. Description of the Procedure: Protocol guidelines were followed. The patient was placed in position over the fluoroscopy table. The target area was identified and the area prepped in the usual manner. Skin & deeper tissues infiltrated with local anesthetic. Appropriate amount of time allowed to pass for local anesthetics to take effect. The procedure needles were then advanced to the target area. Proper needle placement secured. Negative aspiration confirmed. Solution injected in intermittent fashion, asking for systemic symptoms every 0.5cc of injectate. The needles were then removed and the area cleansed, making sure to leave some of the prepping solution back to take advantage of its long term bactericidal properties. Vitals:   11/21/18 0947  BP: (!) 135/104  Pulse: 88  Resp: 18  Temp: 98.8 F (37.1 C)  TempSrc: Oral   SpO2: 97%  Weight: 220 lb (99.8 kg)  Height: 5' 8.5" (1.74 m)    Start Time: 1016 hrs. End Time: 1017 hrs. Materials:  Needle(s) Type: Regular  needle Gauge: 25G Length: 1.5-in Medication(s): Please see orders for medications and dosing details. 3 cc solution made of 2 cc of 0.2% cocaine, 1 cc of Decadron 10 mg/cc, injected into intra-articular left knee Imaging Guidance:          Type of Imaging Technique: None used Indication(s): N/A Exposure Time: No patient exposure Contrast: None used. Fluoroscopic Guidance: N/A Ultrasound Guidance: N/A Interpretation: N/A  Antibiotic Prophylaxis:   Anti-infectives (From admission, onward)   None     Indication(s): None identified  Post-operative Assessment:  Post-procedure Vital Signs:  Pulse/HCG Rate: 88  Temp: 98.8 F (37.1 C) Resp: 18 BP: (!) 135/104 SpO2: 97 %  EBL: None  Complications: No immediate post-treatment complications observed by team, or reported by patient.  Note: The patient tolerated the entire procedure well. A repeat set of vitals were taken after the procedure and the patient was kept under observation following institutional policy, for this type of procedure. Post-procedural neurological assessment was performed, showing return to baseline, prior to discharge. The patient was provided with post-procedure discharge instructions, including a section on how to identify potential problems. Should any problems arise concerning this procedure, the patient was given instructions to immediately contact us, at any time, without hesitation. In any case, we plan to contact the patient by telephone for a follow-up status report regarding this interventional procedure.  Comments:  No additional relevant information.  Medications administered: We administered ropivacaine (PF) 2 mg/mL (0.2%), dexamethasone, and lidocaine.  See the medical record for exact dosing, route, and time of administration.  Disposition:  Discharge home  Discharge Date & Time: 11/21/2018; 1020 hrs.   Follow-up plan:    Future Appointments  Date Time Provider [place]  11/27/2018 11:00 AM [facility] None  12/13/2018 10:30 AM Tyler Pita, MD [facility] None  01/10/2019 12:15 PM Gillis Santa, MD [facility] None  01/11/2019  9:00 AM Mikey College, NP [facility] None   Primary Care Physician: Mikey College, NP Location: [facility] Outpatient Pain Management Facility Note by: Gillis Santa, MD Date: 11/21/2018; Time: 11:54 AM  Disclaimer:  Medicine is not an exact science. The only guarantee in medicine is that nothing is guaranteed. It is important to note that the decision to proceed with this intervention was based on the information collected from the patient. The Data and conclusions were drawn from the patient's questionnaire, the interview, and the physical examination. Because the information was provided in large part by the patient, it cannot be guaranteed that it has not been purposely or unconsciously manipulated. Every effort has been made to obtain as much relevant data as possible for this evaluation. It is important to note that the conclusions that lead to this procedure are derived in large part from the available data. Always take into account that the treatment will also be dependent on availability of resources and existing treatment guidelines, considered by other Pain Management Practitioners as being common knowledge and practice, at the time of the intervention. For Medico-Legal purposes, it is also important to point out that variation in procedural techniques and pharmacological choices are the acceptable norm. The indications, contraindications, technique, and results of the above procedure should only be interpreted and judged by a Board-Certified Interventional Pain Specialist with extensive familiarity and expertise in the same exact procedure and technique.

## 2018-11-21 NOTE — Progress Notes (Signed)
Safety precautions to be maintained throughout the outpatient stay will include: orient to surroundings, keep bed in low position, maintain call bell within reach at all times, provide assistance with transfer out of bed and ambulation.  

## 2018-11-21 NOTE — Patient Instructions (Signed)

## 2018-11-22 ENCOUNTER — Telehealth: Payer: Self-pay | Admitting: *Deleted

## 2018-11-22 ENCOUNTER — Telehealth: Payer: Self-pay | Admitting: Student in an Organized Health Care Education/Training Program

## 2018-11-22 MED ORDER — HYDROCODONE-ACETAMINOPHEN 7.5-325 MG PO TABS: 1.0000 | ORAL_TABLET | Freq: Four times a day (QID) | ORAL | 0 refills | Status: AC | PRN

## 2018-11-22 MED ORDER — HYDROCODONE-ACETAMINOPHEN 7.5-325 MG PO TABS: 1.0000 | ORAL_TABLET | Freq: Four times a day (QID) | ORAL | 0 refills | Status: DC | PRN

## 2018-11-22 NOTE — Telephone Encounter (Signed)
Previous Rx not received by pharmacy on 8/3  HYDROcodone-acetaminophen ([place]) 7.5-325 MG tablet 105 tablet 0 12/28/2018 01/27/2019   Sig - Route: Take 1 tablet by mouth every 6 (six) hours as needed for severe pain. Must last 30 days. - Oral   Sent to pharmacy as: HYDROcodone-acetaminophen ([place]) 7.5-325 MG tablet   Earliest Fill Date: 12/28/2018   Notes to Pharmacy: [place] STOP ACT - Not applicable. Fill one day early if pharmacy is closed on scheduled refill date.   E-Prescribing Status: Transmission to pharmacy failed (11/12/2018 11:51 AM EDT)    Re-Rx as below  Requested Prescriptions   Signed Prescriptions Disp Refills  . HYDROcodone-acetaminophen ([place]) 7.5-325 MG tablet 105 tablet 0    Sig: Take 1 tablet by mouth every 6 (six) hours as needed for severe pain. Must last 30 days.    Authorizing Provider: Gillis Santa  . HYDROcodone-acetaminophen ([place]CO) 7.5-325 MG tablet 105 tablet 0    Sig: Take 1 tablet by mouth every 6 (six) hours as needed for severe pain. Must last 30 days.    Authorizing Provider: Gillis Santa

## 2018-11-22 NOTE — Telephone Encounter (Signed)
Patient's husband called stating they had called to check on medications at the pharmacy, [facility], pharmacy is telling them they have nothing on file, can you please check this and let them know.

## 2018-11-22 NOTE — Telephone Encounter (Signed)
Denies any post procedure issues. 

## 2018-11-22 NOTE — Telephone Encounter (Signed)
patient called and made aware of scripts resent.

## 2018-11-22 NOTE — Telephone Encounter (Signed)
Dr. Holley Raring, Please check the scripts for hydrocodone you e-scribed on 08/03. They failed to transmit to pharmacy and will need to be redone. ThxAnderson [place]

## 2018-11-27 ENCOUNTER — Ambulatory Visit
Admission: RE | Admit: 2018-11-27 | Discharge: 2018-11-27 | Disposition: A | Discharge status: Home / Self Care | Payer: Medicare Other | Source: Ambulatory Visit | Attending: Pulmonary Disease | Admitting: Pulmonary Disease

## 2018-11-27 ENCOUNTER — Other Ambulatory Visit: Payer: Self-pay

## 2018-11-27 DIAGNOSIS — E785 Hyperlipidemia, unspecified: Secondary | ICD-10-CM | POA: Diagnosis not present

## 2018-11-27 DIAGNOSIS — R0600 Dyspnea, unspecified: Secondary | ICD-10-CM | POA: Insufficient documentation

## 2018-11-27 DIAGNOSIS — I10 Essential (primary) hypertension: Secondary | ICD-10-CM | POA: Diagnosis not present

## 2018-11-27 NOTE — Progress Notes (Signed)
*  PRELIMINARY RESULTS* Echocardiogram 2D Echocardiogram has been performed.  Pamela Romero 11/27/2018, 11:42 AM

## 2018-12-13 ENCOUNTER — Ambulatory Visit: Payer: Medicare Other | Admitting: Pulmonary Disease

## 2019-01-04 ENCOUNTER — Other Ambulatory Visit: Payer: Self-pay

## 2019-01-04 ENCOUNTER — Ambulatory Visit (INDEPENDENT_AMBULATORY_CARE_PROVIDER_SITE_OTHER): Payer: Medicare Other | Admitting: Nurse Practitioner

## 2019-01-04 ENCOUNTER — Encounter: Payer: Self-pay | Admitting: Nurse Practitioner

## 2019-01-04 VITALS — BP 143/100 | HR 89 | Resp 20 | Ht 68.5 in | Wt 236.6 lb

## 2019-01-04 DIAGNOSIS — M545 Low back pain, unspecified: Secondary | ICD-10-CM

## 2019-01-04 DIAGNOSIS — K5900 Constipation, unspecified: Secondary | ICD-10-CM | POA: Diagnosis not present

## 2019-01-04 MED ORDER — BACLOFEN 10 MG PO TABS: 10.0000 mg | ORAL_TABLET | Freq: Three times a day (TID) | ORAL | 1 refills | Status: DC | PRN

## 2019-01-04 NOTE — Patient Instructions (Addendum)
Pamela Romero,   Thank you for coming in to clinic today.  1. Increase your miralax to 1/2 dose every day.  Increase your dose to one dose daily if needed.  2. You have a lumbar (low back) muscle strain. Likely caused by muscle weakness. - Start taking Tylenol extra strength 1 to 2 tablets every 6-8 hours for aches or fever/chills for next few days as needed.  Do not take more than 3,000 mg in 24 hours from all medicines.  May take Ibuprofen as well if tolerated 200-400mg  every 8 hours as needed. May alternate tylenol and ibuprofen in same day. - Use heat and ice.  Apply this for 15 minutes at a time 6-8 times per day.   - Muscle rub with lidocaine, lidocaine patch, Biofreeze, or tiger balm for topical pain relief.  Avoid using this with heat and ice to avoid burns. - START muscle relaxer baclofen 10 mg one tablet up to three times daily.  This can cause drowsiness, so use caution.  It may be best to only take this at night for helping you during sleep.   Please schedule a follow-up appointment with Cassell Smiles, AGNP. Return 4-6 weeks if symptoms worsen or fail to improve.   If you have any other questions or concerns, please feel free to call the clinic or send a message through [place]. You may also schedule an earlier appointment if necessary.  You will receive a survey after today's visit either digitally by e-mail or paper by [employer]. Your experiences and feedback matter to [place].  Please respond so we know how we are doing as we provide care for you.   Cassell Smiles, DNP, AGNP-BC Adult Gerontology Nurse Practitioner [facility], [facility]  Low Back Pain Exercises See other page with pictures of each exercise.  Start with 1 or 2 of these exercises that you are most comfortable with. Do not do any exercises that cause you significant worsening pain. Some of these may cause some "stretching soreness" but it should go away after you stop the exercise, and get better over  time. Gradually increase up to 3-4 exercises as tolerated.  Standing hamstring stretch: Place the heel of your leg on a stool about 15 inches high. Keep your knee straight. Lean forward, bending at the hips until you feel a mild stretch in the back of your thigh. Make sure you do not roll your shoulders and bend at the waist when doing this or you will stretch your lower back instead. Hold the stretch for 15 to 30 seconds. Repeat 3 times. Repeat the same stretch on your other leg.  Cat and camel: Get down on your hands and knees. Let your stomach sag, allowing your back to curve downward. Hold this position for 5 seconds. Then arch your back and hold for 5 seconds. Do 3 sets of 10.  Quadriped Arm/Leg Raises: Get down on your hands and knees. Tighten your abdominal muscles to stiffen your spine. While keeping your abdominals tight, raise one arm and the opposite leg away from you. Hold this position for 5 seconds. Lower your arm and leg slowly and alternate sides. Do this 10 times on each side.  Pelvic tilt: Lie on your back with your knees bent and your feet flat on the floor. Tighten your abdominal muscles and push your lower back into the floor. Hold this position for 5 seconds, then relax. Do 3 sets of 10.  Partial curl: Lie on your back  with your knees bent and your feet flat on the floor. Tighten your stomach muscles and flatten your back against the floor. Tuck your chin to your chest. With your hands stretched out in front of you, curl your upper body forward until your shoulders clear the floor. Hold this position for 3 seconds. Don't hold your breath. It helps to breathe out as you lift your shoulders up. Relax. Repeat 10 times. Build to 3 sets of 10. To challenge yourself, clasp your hands behind your head and keep your elbows out to the side.  Lower trunk rotation: Lie on your back with your knees bent and your feet flat on the floor. Tighten your abdominal muscles and push your lower back  into the floor. Keeping your shoulders down flat, gently rotate your legs to one side, then the other as far as you can. Repeat 10 to 20 times.  Single knee to chest stretch: Lie on your back with your legs straight out in front of you. Bring one knee up to your chest and grasp the back of your thigh. Pull your knee toward your chest, stretching your buttock muscle. Hold this position for 15 to 30 seconds and return to the starting position. Repeat 3 times on each side.  Double knee to chest: Lie on your back with your knees bent and your feet flat on the floor. Tighten your abdominal muscles and push your lower back into the floor. Pull both knees up to your chest. Hold for 5 seconds and repeat 10 to 20 times.      Food Choices for Gastroesophageal Reflux Disease, Adult When you have gastroesophageal reflux disease (GERD), the foods you eat and your eating habits are very important. Choosing the right foods can help ease your discomfort. Think about working with a nutrition specialist (dietitian) to help you make good choices. What are tips for following this plan?  Meals  Choose healthy foods that are low in fat, such as fruits, vegetables, whole grains, low-fat dairy products, and lean meat, fish, and poultry.  Eat small meals often instead of 3 large meals a day. Eat your meals slowly, and in a place where you are relaxed. Avoid bending over or lying down until 2-3 hours after eating.  Avoid eating meals 2-3 hours before bed.  Avoid drinking a lot of liquid with meals.  Cook foods using methods other than frying. Bake, grill, or broil food instead.  Avoid or limit: ? Chocolate. ? Peppermint or spearmint. ? Alcohol. ? Pepper. ? Black and decaffeinated coffee. ? Black and decaffeinated tea. ? Bubbly (carbonated) soft drinks. ? Caffeinated energy drinks and soft drinks.  Limit high-fat foods such as: ? Fatty meat or fried foods. ? Whole milk, cream, butter, or ice  cream. ? Nuts and nut butters. ? Pastries, donuts, and sweets made with butter or shortening.  Avoid foods that cause symptoms. These foods may be different for everyone. Common foods that cause symptoms include: ? Tomatoes. ? Oranges, lemons, and limes. ? Peppers. ? Spicy food. ? Onions and garlic. ? Vinegar. Lifestyle  Maintain a healthy weight. Ask your doctor what weight is healthy for you. If you need to lose weight, work with your doctor to do so safely.  Exercise for at least 30 minutes for 5 or more days each week, or as told by your doctor.  Wear loose-fitting clothes.  Do not smoke. If you need help quitting, ask your doctor.  Sleep with the head of your bed higher  than your feet. Use a wedge under the mattress or blocks under the bed frame to raise the head of the bed. Summary  When you have gastroesophageal reflux disease (GERD), food and lifestyle choices are very important in easing your symptoms.  Eat small meals often instead of 3 large meals a day. Eat your meals slowly, and in a place where you are relaxed.  Limit high-fat foods such as fatty meat or fried foods.  Avoid bending over or lying down until 2-3 hours after eating.  Avoid peppermint and spearmint, caffeine, alcohol, and chocolate. This information is not intended to replace advice given to you by your health care provider. Make sure you discuss any questions you have with your health care provider. Document Released: 09/27/2011 Document Revised: 07/19/2018 Document Reviewed: 05/03/2016 Elsevier Patient Education  2020 [employer].

## 2019-01-04 NOTE — Progress Notes (Signed)
Subjective:    Patient ID: Pamela Romero, female    DOB: 02-12-1951, 68 y.o.   MRN: [id number]  Pamela Romero is a 68 y.o. female presenting on 01/04/2019 for Back Pain (constant lower back pain that worsen with prolong standing. The pt contribute it to her recently returning to back to work. ) and Constipation (with nauseated)   HPI Back Pain First day back had aching, pressure in back.  When she sat down, pain eased up and was able to finish work.  Then was having trouble getting into/out of car.  Onset of pain 3 days ago 01/01/2019.   Ibuprofen and acetaminophen are not helping much.  Pain returns after.  - Patient has had some mild back pain in past.    Constipation Patient has constant constipation.  Patient has bowel movements only when she takes Miralax.  This will work over the course of 3-4 days.  Patient has also had nausea when having constipation. - Patient takes Miralax one day per week.  BM occurs every other week.  Social History   Tobacco Use  . Smoking status: Former Smoker    Packs/day: 0.50    Years: 50.00    Pack years: 25.00    Quit date: 05/11/2018    Years since quitting: 0.6  . Smokeless tobacco: Never Used  Substance Use Topics  . Alcohol use: No  . Drug use: No    Review of Systems Per HPI unless specifically indicated above     Objective:    BP (!) 143/100 (BP Location: Right Arm, Patient Position: Sitting, Cuff Size: Normal)   Pulse 89   Resp 20   Ht 5' 8.5" (1.74 m)   Wt 236 lb 9.6 oz (107.3 kg)   SpO2 99%   BMI 35.45 kg/m   Wt Readings from Last 3 Encounters:  01/04/19 236 lb 9.6 oz (107.3 kg)  11/21/18 220 lb (99.8 kg)  11/09/18 239 lb 9.6 oz (108.7 kg)    Physical Exam Vitals signs reviewed.  Constitutional:      General: She is not in acute distress.    Appearance: She is well-developed.  HENT:     Head: Normocephalic and atraumatic.  Cardiovascular:     Rate and Rhythm: Normal rate and regular rhythm.     Pulses:  Normal pulses.     Heart sounds: Normal heart sounds. No murmur. No friction rub. No gallop.   Pulmonary:     Effort: Pulmonary effort is normal.     Breath sounds: Normal air entry. Decreased breath sounds and wheezing present. No rhonchi or rales.  Musculoskeletal:     Comments: Low Back Inspection: Normal appearance, Large body habitus, no spinal deformity, symmetrical. Palpation: No tenderness over spinous processes. Bilateral lumbar paraspinal muscles tender and with hypertonicity/spasm. ROM: Full active ROM forward flex / back extension, rotation L/R with mild to moderate discomfort Special Testing: Seated SLR negative for radicular pain bilaterally and negative for localized back pain. Strength: Bilateral hip flex/ext 5/5, knee flex/ext 5/5, ankle dorsiflex/plantarflex 5/5 Neurovascular: intact distal sensation to light touch   Skin:    General: Skin is warm and dry.     Capillary Refill: Capillary refill takes less than 2 seconds.  Neurological:     General: No focal deficit present.     Mental Status: She is alert and oriented to person, place, and time. Mental status is at baseline.  Psychiatric:        Mood and Affect: Mood  normal.        Behavior: Behavior normal.    Results for orders placed or performed in visit on 11/06/18  TSH  Result Value Ref Range   TSH 71.30 (H) 0.40 - 4.50 mIU/L  T4, free  Result Value Ref Range   Free T4 0.2 (L) 0.8 - 1.8 ng/dL  CBC with Differential/Platelet  Result Value Ref Range   WBC 5.9 3.8 - 10.8 Thousand/uL   RBC 3.84 3.80 - 5.10 Million/uL   Hemoglobin 12.9 11.7 - 15.5 g/dL   HCT 38.2 35.0 - 45.0 %   MCV 99.5 80.0 - 100.0 fL   MCH 33.6 (H) 27.0 - 33.0 pg   MCHC 33.8 32.0 - 36.0 g/dL   RDW 13.9 11.0 - 15.0 %   Platelets 147 140 - 400 Thousand/uL   MPV 10.5 7.5 - 12.5 fL   Neutro Abs 4,160 1,500 - 7,800 cells/uL   Lymphs Abs 1,345 850 - 3,900 cells/uL   Absolute Monocytes 319 200 - 950 cells/uL   Eosinophils Absolute 59 15 -  500 cells/uL   Basophils Absolute 18 0 - 200 cells/uL   Neutrophils Relative % 70.5 %   Total Lymphocyte 22.8 %   Monocytes Relative 5.4 %   Eosinophils Relative 1.0 %   Basophils Relative 0.3 %  COMPLETE METABOLIC PANEL WITH GFR  Result Value Ref Range   Glucose, Bld 81 65 - 99 mg/dL   BUN 16 7 - 25 mg/dL   Creat 1.26 (H) 0.50 - 0.99 mg/dL   GFR, Est Non African American 44 (L) > OR = 60 mL/min/1.52m2   GFR, Est African American 51 (L) > OR = 60 mL/min/1.45m2   BUN/Creatinine Ratio 13 6 - 22 (calc)   Sodium 141 135 - 146 mmol/L   Potassium 3.8 3.5 - 5.3 mmol/L   Chloride 101 98 - 110 mmol/L   CO2 31 20 - 32 mmol/L   Calcium 8.9 8.6 - 10.4 mg/dL   Total Protein 6.7 6.1 - 8.1 g/dL   Albumin 4.0 3.6 - 5.1 g/dL   Globulin 2.7 1.9 - 3.7 g/dL (calc)   AG Ratio 1.5 1.0 - 2.5 (calc)   Total Bilirubin 0.4 0.2 - 1.2 mg/dL   Alkaline phosphatase (APISO) 48 37 - 153 U/L   AST 16 10 - 35 U/L   ALT 6 6 - 29 U/L  Hemoglobin A1c  Result Value Ref Range   Hgb A1c MFr Bld 5.1 <5.7 % of total Hgb   Mean Plasma Glucose 100 (calc)   eAG (mmol/L) 5.5 (calc)      Assessment & Plan:   Problem List Items Addressed This Visit    None    Visit Diagnoses    Acute bilateral low back pain without sciatica    -  Primary Pain likely self-limited.  Muscle strain possible complicated by large body habitus.  Plan:  1. Treat with OTC pain meds (acetaminophen and ibuprofen).  Discussed alternate dosing and max dosing. 2. Apply heat and/or ice to affected area. 3. May also apply a muscle rub with lidocaine or lidocaine patch after heat or ice. 4. Take muscle relaxer baclofen 10 mg up to three times daily.  Cautioned drowsiness. 5. Follow up 4-6 weeks prn     Relevant Medications   baclofen (LIORESAL) 10 MG tablet   Constipation, unspecified constipation type     Uncontrolled.  Minimally improved with Miralax, but not taking daily.  Increase dose to 1/2 dose daily, increase to  1 full dose daily if  needed and if tolerated. Follow-up prn.      Meds ordered this encounter  Medications  . baclofen (LIORESAL) 10 MG tablet    Sig: Take 1 tablet (10 mg total) by mouth 3 (three) times daily as needed for muscle spasms.    Dispense:  60 each    Refill:  1    Order Specific Question:   Supervising Provider    Answer:   Olin Hauser [[id number]]    Follow up plan: Return 4-6 weeks if symptoms worsen or fail to improve.  Cassell Smiles, DNP, AGPCNP-BC Adult Gerontology Primary Care Nurse Practitioner [facility] 01/04/2019, 11:24 AM

## 2019-01-09 ENCOUNTER — Encounter: Payer: Self-pay | Admitting: Nurse Practitioner

## 2019-01-09 ENCOUNTER — Telehealth: Payer: Self-pay | Admitting: *Deleted

## 2019-01-09 NOTE — Telephone Encounter (Signed)
Attempted to call for pre appointment med/allergy review. Line busy at home number, no answer at mobile number. Unable to leave a message.

## 2019-01-10 ENCOUNTER — Ambulatory Visit
Payer: Medicare Other | Attending: Student in an Organized Health Care Education/Training Program | Admitting: Student in an Organized Health Care Education/Training Program

## 2019-01-10 ENCOUNTER — Telehealth: Payer: Self-pay

## 2019-01-10 ENCOUNTER — Encounter: Payer: Self-pay | Admitting: Student in an Organized Health Care Education/Training Program

## 2019-01-10 DIAGNOSIS — M170 Bilateral primary osteoarthritis of knee: Secondary | ICD-10-CM | POA: Diagnosis not present

## 2019-01-10 DIAGNOSIS — Z79891 Long term (current) use of opiate analgesic: Secondary | ICD-10-CM

## 2019-01-10 DIAGNOSIS — G894 Chronic pain syndrome: Secondary | ICD-10-CM | POA: Diagnosis not present

## 2019-01-10 MED ORDER — HYDROCODONE-ACETAMINOPHEN 7.5-325 MG PO TABS: 1.0000 | ORAL_TABLET | Freq: Four times a day (QID) | ORAL | 0 refills | Status: AC | PRN

## 2019-01-10 MED ORDER — HYDROCODONE-ACETAMINOPHEN 7.5-325 MG PO TABS: 1.0000 | ORAL_TABLET | Freq: Four times a day (QID) | ORAL | 0 refills | Status: DC | PRN

## 2019-01-10 NOTE — Progress Notes (Signed)
Pain Management Virtual Encounter Note - Virtual Visit via [place] (real-time audio visits between healthcare provider and patient).   Patient's Phone No. & Preferred Pharmacy:  [phone] (home); [phone] (mobile); (Preferred) [phone] No e-mail address on record  [id number] - [place], [place] - [address] [place] [address] Phone: [phone] Fax: [phone]  [facility] [place], [place] [address] [place] [address] Phone: [phone] Fax: [phone]    Pre-screening note:  Our staff contacted Ms. Old and offered her an "in person", "face-to-face" appointment versus a telephone encounter. She indicated preferring the telephone encounter, at this time.   Reason for Virtual Visit: COVID-19*  Social distancing based on CDC and AMA recommendations.   I contacted MILEAH TETERS on 01/10/2019 via video conference.      I clearly identified myself as Gillis Santa, MD. I verified that I was speaking with the correct person using two identifiers (Name: ADDIE FRANGIPANE, and date of birth: 10/23/1950).  Advanced Informed Consent I sought verbal advanced consent from [name] for virtual visit interactions. I informed Ms. Glosser of possible security and privacy concerns, risks, and limitations associated with providing "not-in-person" medical evaluation and management services. I also informed Ms. Steury of the availability of "in-person" appointments. Finally, I informed her that there would be a charge for the virtual visit and that she could be  personally, fully or partially, financially responsible for it. Ms. Bloomingdale expressed understanding and agreed to proceed.   Historic Elements   Ms. Jovonna KHARMA MESSIMER is a 68 y.o. year old, female patient evaluated today after her last encounter by our practice on 01/09/2019. Ms. Deprimo  has a past medical history of Abnormal  EKG, CAD (coronary artery disease), Cancer ([place]), Endometrial adenocarcinoma ([place]) (2011), Hyperlipidemia, Hypertension, Osteoarthritis, Reflux gastritis, Thyroid disease, tobacco abuse, and Vaginal delivery. She also  has a past surgical history that includes Thyroidectomy (2005); Cardiac catheterization (2006); Total abdominal hysterectomy; Breast surgery (Right, March 2014); and Breast cyst aspiration (Right). Ms. Strength has a current medication list which includes the following prescription(s): baclofen, docusate sodium, esomeprazole, trelegy ellipta, hydrocodone-acetaminophen, hydrocodone-acetaminophen, levothyroxine, lisinopril, montelukast, polyethylene glycol powder, proair hfa, and umeclidinium-vilanterol. She  reports that she quit smoking about 8 months ago. She has a 25.00 pack-year smoking history. She has never used smokeless tobacco. She reports that she does not drink alcohol or use drugs. Ms. Ballas has No Known Allergies.   HPI  Today, she is being contacted for both, medication management and a post-procedure assessment.   Patient is finding benefit after her left knee steroid injection performed on 11/21/2018.  Of note she did fall yesterday.  She states that she hit her knee on an object as she was walking which resulted in her fall.  She is fairly sore.  She is also having arm pain.  She has an appointment with her primary provider tomorrow.  She is also experiencing swelling of her left knee.  She is using compression stocking as well as elevation to help reduce swelling.  I encouraged her to continue doing that.  Pharmacotherapy Assessment  Analgesic: 12/28/2018  1   11/22/2018  Hydrocodone-Acetamin 7.5-325  105.00  30 Bi Lat   [id number]   Nor (1409)   0  26.25 MME  Private Pay   [place]    Monitoring: Pharmacotherapy: No side-effects or adverse reactions reported.  PMP: PDMP reviewed during  this encounter.       Compliance: No problems identified. Effectiveness: Clinically  acceptable. Plan: Refer to "POC".  UDS:  Summary  Date Value Ref Range Status  08/30/2017 FINAL  Final    Comment:    ==================================================================== TOXASSURE COMP DRUG ANALYSIS,UR ==================================================================== Test                             Result       Flag       Units Drug Present and Declared for Prescription Verification   Tramadol                       >1479        EXPECTED   ng/mg creat   O-Desmethyltramadol            >1479        EXPECTED   ng/mg creat   N-Desmethyltramadol            >1479        EXPECTED   ng/mg creat    Source of tramadol is a prescription medication.    O-desmethyltramadol and N-desmethyltramadol are expected    metabolites of tramadol.   Diclofenac                     PRESENT      EXPECTED Drug Present not Declared for Prescription Verification   Acetaminophen                  PRESENT      UNEXPECTED Drug Absent but Declared for Prescription Verification   Duloxetine                     Not Detected UNEXPECTED ==================================================================== Test                      Result    Flag   Units      Ref Range   Creatinine              338              mg/dL      >=20 ==================================================================== Declared Medications:  The flagging and interpretation on this report are based on the  following declared medications.  Unexpected results may arise from  inaccuracies in the declared medications.  **Note: The testing scope of this panel includes these medications:  Duloxetine  Tramadol  **Note: The testing scope of this panel does not include small to  moderate amounts of these reported medications:  Diclofenac  **Note: The testing scope of this panel does not include following  reported medications:  Docusate (Colace)  Hydrochlorothiazide (Zestoretic)  Levothyroxine (Synthroid)  Lisinopril  (Zestoretic)  Omeprazole (Nexium)  Ranitidine  Rosuvastatin ==================================================================== For clinical consultation, please call [phone]. ====================================================================    Laboratory Chemistry Profile (12 mo)  Renal: 11/06/2018: BUN 16; BUN/Creatinine Ratio 13; Creat 1.26  Lab Results  Component Value Date   GFR 60.34 01/21/2015   GFRAA 51 (L) 11/06/2018   GFRNONAA 44 (L) 11/06/2018   Hepatic: No results found for requested labs within last 8760 hours. Lab Results  Component Value Date   AST 16 11/06/2018   ALT 6 11/06/2018   Other: No results found for requested labs within last 8760 hours. Note: Above Lab results reviewed.  Imaging  Last 90 days:  Dg Chest 2 View  Result Date: 11/09/2018 CLINICAL DATA:  Shortness of breath for 4 months EXAM: CHEST - 2 VIEW COMPARISON:  February 09, 2017 FINDINGS: The heart size and mediastinal contours are stable. The aorta is tortuous. There is no focal infiltrate, pulmonary edema, or pleural effusion. Mild linear scar or atelectasis is noted in both lung bases. The visualized skeletal structures are stable. IMPRESSION: Mild atelectasis or scar of both lung bases, stable compared prior exam. No focal pneumonia noted. Electronically Signed   By: Abelardo Diesel M.D.   On: 11/09/2018 15:19    Assessment  The primary encounter diagnosis was Chronic pain syndrome. Diagnoses of Chronic use of opiate for therapeutic purpose and Bilateral primary osteoarthritis of knee were also pertinent to this visit.  Plan of Care  I am having 53 M. Burkman start on HYDROcodone-acetaminophen. I am also having her maintain her docusate sodium, esomeprazole, lisinopril, ProAir HFA, umeclidinium-vilanterol, polyethylene glycol powder, montelukast, levothyroxine, Trelegy Ellipta, baclofen, and HYDROcodone-acetaminophen.  Pharmacotherapy (Medications Ordered): Meds ordered this  encounter  Medications  . HYDROcodone-acetaminophen (NORCO) 7.5-325 MG tablet    Sig: Take 1 tablet by mouth every 6 (six) hours as needed for severe pain. Must last 30 days.    Dispense:  105 tablet    Refill:  0    [place] STOP ACT - Not applicable. Fill one day early if pharmacy is closed on scheduled refill date.  [name] HYDROcodone-acetaminophen (NORCO) 7.5-325 MG tablet    Sig: Take 1 tablet by mouth every 6 (six) hours as needed for severe pain. Must last 30 days.    Dispense:  105 tablet    Refill:  0     STOP ACT - Not applicable. Fill one day early if pharmacy is closed on scheduled refill date.   Orders:  Orders Placed This Encounter  Procedures  . ToxASSURE Select 13 (MW), Urine    Volume: 30 ml(s). Minimum 3 ml of urine is needed. Document temperature of fresh sample. Indications: Long term (current) use of opiate analgesic [id number])   Follow-up plan:   Return in about 9 weeks (around 03/14/2019) for Medication Management.     Status post left genicular RFA 04/16/2018, status post right genicular RFA 03/12/2018.  Provided her with benefit.  Status post left knee intra-articular steroid injection #3 on 11/21/2018.  Helped.  Can consider repeating in future..    Recent Visits Date Type Provider Dept  11/21/18 Procedure visit Gillis Santa, MD [facility]  11/12/18 Office Visit Gillis Santa, MD [facility]  Showing recent visits within past 90 days and meeting all other requirements   Today's Visits Date Type Provider Dept  01/10/19 Office Visit Gillis Santa, MD [facility]  Showing today's visits and meeting all other requirements   Future Appointments No visits were found meeting these conditions.  Showing future appointments within next 90 days and meeting all other requirements   I discussed the assessment and treatment plan with the patient. The patient was provided an opportunity to ask questions and all were answered. The patient agreed with  the plan and demonstrated an understanding of the instructions.  Patient advised to call back or seek an in-person evaluation if the symptoms or condition worsens.  Total duration of non-face-to-face encounter:25 minutes.  Note by: Gillis Santa, MD Date: 01/10/2019; Time: 11:34 AM  Note: This dictation was prepared with Dragon dictation. Any transcriptional errors that may result from this process are unintentional.  Disclaimer:  * Given the special circumstances of the COVID-19  pandemic, the federal government has announced that [employer] for Civil Rights ([employer]) will exercise its enforcement discretion and will not impose penalties on physicians using telehealth in the event of noncompliance with regulatory requirements under the [place] (HIPAA) in connection with the good faith provision of telehealth during the XX123456 national public health emergency. ([place])

## 2019-01-10 NOTE — Telephone Encounter (Addendum)
I called the patient and informed her about her appt scheduled for tomorrow. She report a fall on yesterday that caused her to injury her knee.

## 2019-01-11 ENCOUNTER — Ambulatory Visit (INDEPENDENT_AMBULATORY_CARE_PROVIDER_SITE_OTHER): Payer: Medicare Other | Admitting: Nurse Practitioner

## 2019-01-11 ENCOUNTER — Encounter: Payer: Self-pay | Admitting: Nurse Practitioner

## 2019-01-11 ENCOUNTER — Other Ambulatory Visit: Payer: Self-pay

## 2019-01-11 VITALS — BP 120/64 | HR 86 | Ht 68.5 in | Wt 236.2 lb

## 2019-01-11 DIAGNOSIS — J441 Chronic obstructive pulmonary disease with (acute) exacerbation: Secondary | ICD-10-CM | POA: Diagnosis not present

## 2019-01-11 DIAGNOSIS — Z23 Encounter for immunization: Secondary | ICD-10-CM | POA: Diagnosis not present

## 2019-01-11 DIAGNOSIS — I10 Essential (primary) hypertension: Secondary | ICD-10-CM | POA: Diagnosis not present

## 2019-01-11 DIAGNOSIS — W19XXXA Unspecified fall, initial encounter: Secondary | ICD-10-CM | POA: Diagnosis not present

## 2019-01-11 MED ORDER — TRELEGY ELLIPTA 100-62.5-25 MCG/INH IN AEPB: 1.0000 | INHALATION_SPRAY | Freq: Every day | RESPIRATORY_TRACT | 11 refills | Status: DC

## 2019-01-11 MED ORDER — MONTELUKAST SODIUM 10 MG PO TABS: 10.0000 mg | ORAL_TABLET | Freq: Every day | ORAL | 1 refills | Status: DC

## 2019-01-11 NOTE — Progress Notes (Signed)
Subjective:    Patient ID: Pamela Romero, female    DOB: January 04, 1951, 68 y.o.   MRN: [id number]  Pamela Romero is a 68 y.o. female presenting on 01/11/2019 for COPD and Fall (pt reports falling x 2 days ago in her house. She states that she hit her Lt knee on an object as she was walking which resulted in her fall. She couldn't get up off the floor so she laid on the floor for over 20 hrs. She state she finally got enough strength to get to the phone to call 911.  She is fairly sore, complains of lower back pain, tailbone pain, left hand and left knee.)  HPI Fall with bruising Patient went to work Tuesday and was fine without pain, back started hurting a little so took a muscle relaxer.  Pain increased and shot up her back.  After work, she sat on side of bed and fell on floor and couldn't get up.  Patient took hydrocodone for knee pain.  Patient had taken muscle relaxer that afternoon (at least 3-4 hours apart). - patient notes no dizziness with muscle relaxer. - Patient had taken nightly for 4 nights, good relief - Patient got sick to her stomach and threw up.  She covered vomit with clothes. Patient has returned to work 24 hours per week.  Currently working 8 hr shifts.  Has not asked to work shorter shift. Patient is running spotter machine - loading/unloading, changing inserts.  Most are 5-10 lbs or less. - Patient refused to go to hospital with EMS  COPD Breathing is a little better.  Some days is worse than others.  Trelegy is working well.   - Working is not a problem.  Hypertension - She is not checking BP at home or outside of clinic.    - Current medications: lisinopril 10 mg daily, tolerating well without side effects - She is not currently symptomatic. - Pt denies headache, lightheadedness, dizziness, changes in vision, chest tightness/pressure, palpitations, leg swelling, sudden loss of speech or loss of consciousness. - She  reports no regular exercise routine. - Her  diet is moderate in salt, moderate in fat, and moderate in carbohydrates.   Social History   Tobacco Use  . Smoking status: Former Smoker    Packs/day: 0.50    Years: 50.00    Pack years: 25.00    Quit date: 05/11/2018    Years since quitting: 0.6  . Smokeless tobacco: Never Used  Substance Use Topics  . Alcohol use: No  . Drug use: No    Review of Systems Per HPI unless specifically indicated above     Objective:    BP 120/64 (BP Location: Left Arm, Patient Position: Sitting, Cuff Size: Large)   Pulse 86   Ht 5' 8.5" (1.74 m)   Wt 236 lb 3.2 oz (107.1 kg)   SpO2 98%   BMI 35.39 kg/m   Wt Readings from Last 3 Encounters:  01/11/19 236 lb 3.2 oz (107.1 kg)  01/04/19 236 lb 9.6 oz (107.3 kg)  11/21/18 220 lb (99.8 kg)    Physical Exam Vitals signs reviewed.  Constitutional:      General: She is not in acute distress.    Appearance: She is well-developed. She is obese.  HENT:     Head: Normocephalic and atraumatic.  Cardiovascular:     Rate and Rhythm: Normal rate and regular rhythm.     Pulses:  Radial pulses are 2+ on the right side and 2+ on the left side.       Posterior tibial pulses are 1+ on the right side and 1+ on the left side.     Heart sounds: Normal heart sounds, S1 normal and S2 normal.  Pulmonary:     Effort: Pulmonary effort is normal. No respiratory distress.     Breath sounds: Normal air entry. Decreased breath sounds and wheezing present.  Musculoskeletal:     Left knee: She exhibits decreased range of motion, swelling and ecchymosis. She exhibits no effusion and no deformity. Tenderness (generalized) found.     Left hand: She exhibits tenderness. She exhibits normal range of motion and no bony tenderness.     Right lower leg: No edema.     Left lower leg: No edema.  Skin:    General: Skin is warm and dry.     Capillary Refill: Capillary refill takes less than 2 seconds.     Findings: Bruising (over LEFT hand, arm) present.   Neurological:     General: No focal deficit present.     Mental Status: She is alert and oriented to person, place, and time. Mental status is at baseline.  Psychiatric:        Attention and Perception: Attention normal.        Mood and Affect: Mood and affect normal.        Behavior: Behavior normal. Behavior is cooperative.        Thought Content: Thought content normal.        Judgment: Judgment normal.    Results for orders placed or performed in visit on 11/06/18  TSH  Result Value Ref Range   TSH 71.30 (H) 0.40 - 4.50 mIU/L  T4, free  Result Value Ref Range   Free T4 0.2 (L) 0.8 - 1.8 ng/dL  CBC with Differential/Platelet  Result Value Ref Range   WBC 5.9 3.8 - 10.8 Thousand/uL   RBC 3.84 3.80 - 5.10 Million/uL   Hemoglobin 12.9 11.7 - 15.5 g/dL   HCT 38.2 35.0 - 45.0 %   MCV 99.5 80.0 - 100.0 fL   MCH 33.6 (H) 27.0 - 33.0 pg   MCHC 33.8 32.0 - 36.0 g/dL   RDW 13.9 11.0 - 15.0 %   Platelets 147 140 - 400 Thousand/uL   MPV 10.5 7.5 - 12.5 fL   Neutro Abs 4,160 1,500 - 7,800 cells/uL   Lymphs Abs 1,345 850 - 3,900 cells/uL   Absolute Monocytes 319 200 - 950 cells/uL   Eosinophils Absolute 59 15 - 500 cells/uL   Basophils Absolute 18 0 - 200 cells/uL   Neutrophils Relative % 70.5 %   Total Lymphocyte 22.8 %   Monocytes Relative 5.4 %   Eosinophils Relative 1.0 %   Basophils Relative 0.3 %  COMPLETE METABOLIC PANEL WITH GFR  Result Value Ref Range   Glucose, Bld 81 65 - 99 mg/dL   BUN 16 7 - 25 mg/dL   Creat 1.26 (H) 0.50 - 0.99 mg/dL   GFR, Est Non African American 44 (L) > OR = 60 mL/min/1.70m2   GFR, Est African American 51 (L) > OR = 60 mL/min/1.45m2   BUN/Creatinine Ratio 13 6 - 22 (calc)   Sodium 141 135 - 146 mmol/L   Potassium 3.8 3.5 - 5.3 mmol/L   Chloride 101 98 - 110 mmol/L   CO2 31 20 - 32 mmol/L   Calcium 8.9 8.6 - 10.4 mg/dL  Total Protein 6.7 6.1 - 8.1 g/dL   Albumin 4.0 3.6 - 5.1 g/dL   Globulin 2.7 1.9 - 3.7 g/dL (calc)   AG Ratio 1.5 1.0  - 2.5 (calc)   Total Bilirubin 0.4 0.2 - 1.2 mg/dL   Alkaline phosphatase (APISO) 48 37 - 153 U/L   AST 16 10 - 35 U/L   ALT 6 6 - 29 U/L  Hemoglobin A1c  Result Value Ref Range   Hgb A1c MFr Bld 5.1 <5.7 % of total Hgb   Mean Plasma Glucose 100 (calc)   eAG (mmol/L) 5.5 (calc)      Assessment & Plan:   Problem List Items Addressed This Visit      Cardiovascular and Mediastinum   Essential hypertension Controlled hypertension.  BP goal < 130/80.  Pt is not working on lifestyle modifications.  Taking medications tolerating well without side effects.   Plan: 1. Continue taking meds without changes 2. Obtain labs   3. Encouraged heart healthy diet and increasing exercise to 30 minutes most days of the week. 4. Check BP 1-2 x per week at home, keep log, and bring to clinic at next appointment. 5. Follow up 3 months.       Respiratory   Chronic obstructive pulmonary disease with acute exacerbation (HCC) Generally improved and stable, but persistent shortness of breath with activity at this time.  Patient should stop Anoro.  START Trelegy. - Follow-up 3 months.   Relevant Medications   Fluticasone-Umeclidin-Vilant (TRELEGY ELLIPTA) 100-62.5-25 MCG/INH AEPB   montelukast (SINGULAIR) 10 MG tablet    Other Visit Diagnoses    Fall, initial encounter    -  Primary Patient with fall at home, down approx 20 hours.  EMS recommended transport to ER, but patient refused.  This is first medical care and is > 48 hours after fall.  No bony tenderness, but ecchymosis persists.  Plan: 1. Concern for kidney function/rhabdo due to time down.  Rhabdomyolysis concerns are not strong due to time after fall. - Labs to evaluate 2. Referral CCM for social work.  Patient needs physical therapy, but refuses today due to financial concerns about copays. 3. Follow-up 2-4 weeks prn no improvement.   Relevant Orders   Comprehensive metabolic panel   CK Total (and CKMB)   Referral to Chronic Care  Management Services   Needs flu shot       Relevant Orders   Flu Vaccine QUAD High Dose(Fluad) (Completed)      Meds ordered this encounter  Medications  . Fluticasone-Umeclidin-Vilant (TRELEGY ELLIPTA) 100-62.5-25 MCG/INH AEPB    Sig: Inhale 1 puff into the lungs daily.    Dispense:  60 each    Refill:  11    Order Specific Question:   Supervising Provider    Answer:   Olin Hauser [[id number]]    Order Specific Question:   Lot Number?    Answer:   [id number]    Order Specific Question:   Expiration Date?    Answer:   01/10/2020  . montelukast (SINGULAIR) 10 MG tablet    Sig: Take 1 tablet (10 mg total) by mouth at bedtime.    Dispense:  90 tablet    Refill:  1    Order Specific Question:   Supervising Provider    Answer:   Olin Hauser [[id number]]   Follow up plan: Return in about 3 months (around 04/13/2019) for COPD, hypertension.  Cassell Smiles, DNP, AGPCNP-BC Adult Gerontology Primary Care Nurse Practitioner [facility] 01/11/2019, 9:17 AM

## 2019-01-11 NOTE — Patient Instructions (Addendum)
[name],   Thank you for coming in to clinic today.  1. Labs today  2. Continue current medicines except Anoro.  STOP Anoro.  Continue Trelegy  3. Continue blood pressure medicines.  They are working well.  4. For your injuries, bruising, and swelling: - Start taking Tylenol extra strength 1 to 2 tablets every 6-8 hours for aches or fever/chills for next few days as needed.  Do not take more than 3,000 mg in 24 hours from all medicines.  May take Ibuprofen as well if tolerated 200-400mg  every 8 hours as needed. May alternate tylenol and ibuprofen in same day. - Use heat tomorrow.  Use ice today.  Apply this for 15 minutes at a time 6-8 times per day.  Okay to stay with ice if needed. - Muscle rub with lidocaine, lidocaine patch, Biofreeze, or tiger balm for topical pain relief.  Avoid using this with heat and ice to avoid burns.  Please schedule a follow-up appointment. Return in about 3 months (around 04/13/2019) for COPD, hypertension.  If you have any other questions or concerns, please feel free to call the clinic or send a message through [place]. You may also schedule an earlier appointment if necessary.  You will receive a survey after today's visit either digitally by e-mail or paper by [employer]. Your experiences and feedback matter to [place].  Please respond so we know how we are doing as we provide care for you.   Cassell Smiles, DNP, AGNP-BC Adult Gerontology Nurse Practitioner [place] [facility]

## 2019-01-17 ENCOUNTER — Encounter: Payer: Self-pay | Admitting: Nurse Practitioner

## 2019-01-17 DIAGNOSIS — J441 Chronic obstructive pulmonary disease with (acute) exacerbation: Secondary | ICD-10-CM | POA: Insufficient documentation

## 2019-01-31 ENCOUNTER — Other Ambulatory Visit: Payer: Self-pay | Admitting: Nurse Practitioner

## 2019-01-31 DIAGNOSIS — I10 Essential (primary) hypertension: Secondary | ICD-10-CM

## 2019-02-11 ENCOUNTER — Telehealth: Payer: Self-pay

## 2019-02-21 ENCOUNTER — Other Ambulatory Visit: Payer: Self-pay | Admitting: Nurse Practitioner

## 2019-02-21 ENCOUNTER — Telehealth: Payer: Self-pay

## 2019-02-21 DIAGNOSIS — M545 Low back pain, unspecified: Secondary | ICD-10-CM

## 2019-02-28 ENCOUNTER — Ambulatory Visit: Payer: Self-pay | Admitting: *Deleted

## 2019-02-28 ENCOUNTER — Telehealth: Payer: Self-pay

## 2019-02-28 NOTE — Chronic Care Management (AMB) (Signed)
  Chronic Care Management   Outreach Note  02/28/2019 Name: Pamela Romero MRN: [id number] DOB: 10-29-1950  Referred by: Mikey College, NP (Inactive) Reason for referral : Chronic Care Management (Unsuccessful outreach )   An unsuccessful telephone outreach was attempted today. The patient was referred to the case management team by for assistance with care management and care coordination.   Follow Up Plan: A HIPPA compliant phone message was left for the patient providing contact information and requesting a return call.  The care management team will reach out to the patient again over the next 60 days.   Merlene Morse Gigi Onstad RN, BSN Nurse Case Pharmacist, [facility] Care Management  [phone]) Business Mobile

## 2019-03-13 ENCOUNTER — Encounter: Payer: Self-pay | Admitting: Student in an Organized Health Care Education/Training Program

## 2019-03-14 ENCOUNTER — Ambulatory Visit
Payer: Medicare Other | Attending: Student in an Organized Health Care Education/Training Program | Admitting: Student in an Organized Health Care Education/Training Program

## 2019-03-14 ENCOUNTER — Encounter: Payer: Self-pay | Admitting: Student in an Organized Health Care Education/Training Program

## 2019-03-14 ENCOUNTER — Other Ambulatory Visit: Payer: Self-pay

## 2019-03-14 DIAGNOSIS — M1712 Unilateral primary osteoarthritis, left knee: Secondary | ICD-10-CM | POA: Diagnosis not present

## 2019-03-14 DIAGNOSIS — M722 Plantar fascial fibromatosis: Secondary | ICD-10-CM

## 2019-03-14 DIAGNOSIS — M170 Bilateral primary osteoarthritis of knee: Secondary | ICD-10-CM | POA: Diagnosis not present

## 2019-03-14 DIAGNOSIS — Z79891 Long term (current) use of opiate analgesic: Secondary | ICD-10-CM

## 2019-03-14 DIAGNOSIS — M19071 Primary osteoarthritis, right ankle and foot: Secondary | ICD-10-CM

## 2019-03-14 DIAGNOSIS — G894 Chronic pain syndrome: Secondary | ICD-10-CM

## 2019-03-14 MED ORDER — HYDROCODONE-ACETAMINOPHEN 7.5-325 MG PO TABS: 1.0000 | ORAL_TABLET | Freq: Four times a day (QID) | ORAL | 0 refills | Status: DC | PRN

## 2019-03-14 MED ORDER — HYDROCODONE-ACETAMINOPHEN 7.5-325 MG PO TABS: 1.0000 | ORAL_TABLET | Freq: Four times a day (QID) | ORAL | 0 refills | Status: AC | PRN

## 2019-03-14 NOTE — Progress Notes (Signed)
Pain Management Virtual Encounter Note - Virtual Visit via [place] (real-time audio visits between healthcare provider and patient).   Patient's Phone No. & Preferred Pharmacy:  [phone] (home); [phone] (mobile); (Preferred) [phone] No e-mail address on record  [id number] - [place], [place] - [address] [address] Phone: [phone] Fax: [phone]  [facility] [place], [place] [address] [place] [address] Phone: [phone] Fax: [phone]    Pre-screening note:  Our staff contacted Pamela Romero and offered her an "in person", "face-to-face" appointment versus a telephone encounter. She indicated preferring the telephone encounter, at this time.   Reason for Virtual Visit: COVID-19*  Social distancing based on CDC and AMA recommendations.   I contacted Pamela Romero on 03/14/2019 via video conference.      I clearly identified myself as Gillis Santa, MD. I verified that I was speaking with the correct person using two identifiers (Name: Pamela Romero, and date of birth: 1950-11-18).  Advanced Informed Consent I sought verbal advanced consent from Pamela Romero for virtual visit interactions. I informed Pamela Romero of possible security and privacy concerns, risks, and limitations associated with providing "not-in-person" medical evaluation and management services. I also informed Pamela Romero of the availability of "in-person" appointments. Finally, I informed her that there would be a charge for the virtual visit and that she could be  personally, fully or partially, financially responsible for it. Pamela Romero expressed understanding and agreed to proceed.   Historic Elements   Pamela Romero is a 68 y.o. year old, female patient evaluated today after her last encounter by our practice on 01/10/2019. Pamela Romero  has a past medical history of Abnormal  EKG, CAD (coronary artery disease), Cancer ([place]), Endometrial adenocarcinoma ([place]) (2011), Hyperlipidemia, Hypertension, Osteoarthritis, Reflux gastritis, Thyroid disease, tobacco abuse, and Vaginal delivery. She also  has a past surgical history that includes Thyroidectomy (2005); Cardiac catheterization (2006); Total abdominal hysterectomy; Breast surgery (Right, March 2014); and Breast cyst aspiration (Right). Pamela Romero has a current medication list which includes the following prescription(s): baclofen, docusate sodium, esomeprazole, trelegy ellipta, hydrocodone-acetaminophen, hydrocodone-acetaminophen, levothyroxine, lisinopril, montelukast, polyethylene glycol powder, and proair hfa. She  reports that she quit smoking about 10 months ago. She has a 25.00 pack-year smoking history. She has never used smokeless tobacco. She reports that she does not drink alcohol or use drugs. Pamela Romero has No Known Allergies.   HPI  Today, she is being contacted for medication management.   No change in medical history since last visit.  Patient continues multimodal pain regimen as prescribed.  States that it provides pain relief and improvement in functional status.  Left knee/leg is doing worse, s/p left knee steroid injections #3.  Wants to avoid surgery at all costs.  Discussed left knee hyalgan series. Will also drain knee effusion at time of injection   Pharmacotherapy Assessment  Analgesic: 02/25/2019  1   01/10/2019  Hydrocodone-Acetamin 7.5-325  105.00  30 Bi Lat   [id number]   Nor (1409)   0  26.25 MME  Private Pay   [place]   Monitoring: Pharmacotherapy: No side-effects or adverse reactions reported. [place] PMP: PDMP reviewed during this encounter.       Compliance: No problems identified. Effectiveness: Clinically acceptable. Plan: Refer to "POC".  UDS:  Summary  Date Value Ref Range Status  08/30/2017 FINAL  Final  Comment:     ==================================================================== TOXASSURE COMP DRUG ANALYSIS,UR ==================================================================== Test                             Result       Flag       Units Drug Present and Declared for Prescription Verification   Tramadol                       >1479        EXPECTED   ng/mg creat   O-Desmethyltramadol            >1479        EXPECTED   ng/mg creat   N-Desmethyltramadol            >1479        EXPECTED   ng/mg creat    Source of tramadol is a prescription medication.    O-desmethyltramadol and N-desmethyltramadol are expected    metabolites of tramadol.   Diclofenac                     PRESENT      EXPECTED Drug Present not Declared for Prescription Verification   Acetaminophen                  PRESENT      UNEXPECTED Drug Absent but Declared for Prescription Verification   Duloxetine                     Not Detected UNEXPECTED ==================================================================== Test                      Result    Flag   Units      Ref Range   Creatinine              338              mg/dL      >=20 ==================================================================== Declared Medications:  The flagging and interpretation on this report are based on the  following declared medications.  Unexpected results may arise from  inaccuracies in the declared medications.  **Note: The testing scope of this panel includes these medications:  Duloxetine  Tramadol  **Note: The testing scope of this panel does not include small to  moderate amounts of these reported medications:  Diclofenac  **Note: The testing scope of this panel does not include following  reported medications:  Docusate (Colace)  Hydrochlorothiazide (Zestoretic)  Levothyroxine (Synthroid)  Lisinopril (Zestoretic)  Omeprazole (Nexium)  Ranitidine  Rosuvastatin ==================================================================== For  clinical consultation, please call [phone]. ====================================================================    Laboratory Chemistry Profile (12 mo)  Renal: 11/06/2018: BUN 16; BUN/Creatinine Ratio 13; Creat 1.26  Lab Results  Component Value Date   GFR 60.34 01/21/2015   GFRAA 51 (L) 11/06/2018   GFRNONAA 44 (L) 11/06/2018   Hepatic: No results found for requested labs within last 8760 hours. Lab Results  Component Value Date   AST 16 11/06/2018   ALT 6 11/06/2018   Other: No results found for requested labs within last 8760 hours. Note: Above Lab results reviewed.  Imaging  ECHOCARDIOGRAM COMPLETE   ECHOCARDIOGRAM REPORT       Patient Name:   SHARNEE CARNEY [facility] Date of Exam: 11/27/2018 Medical Rec #:  [id number]        Height:       68.5 in Accession #:  [id number]       Weight:       220.0 lb Date of Birth:  1950-05-25        BSA:          2.14 m Patient Age:    3 years         BP:           135/104 mmHg Patient Gender: F                HR:           88 bpm. Exam Location:  [facility]    Procedure: 2D Echo, Cardiac Doppler and Color Doppler  Indications:     Dyspnea 786.09   History:         Patient has prior history of Echocardiogram examinations, most                  recent 02/09/2017. CAD Risk Factors: Hypertension.                  Hyperlipidemia, tobacco abuse.   Sonographer:     Sherrie Sport RDCS (AE) Referring Phys:  [id number] CARMEN Veda Canning Diagnosing Phys: Nelva Bush MD    Sonographer Comments: Technically challenging study due to limited acoustic windows. IMPRESSIONS   1. The left ventricle has normal systolic function with an ejection fraction of 60-65%. The cavity size was normal. There is moderately increased left ventricular wall thickness. Left ventricular diastolic Doppler parameters are consistent with impaired  relaxation.  2. The right ventricle has normal systolic function. The cavity was normal. There is no increase in right  ventricular wall thickness. Right ventricular systolic pressure could not be assessed.  3. Right atrial size was mildly dilated.  4. The aortic valve is tricuspid. Mild aortic annular calcification noted.  5. The aorta is normal unless otherwise noted.  6. The interatrial septum was not well visualized.  FINDINGS  Left Ventricle: The left ventricle has normal systolic function, with an ejection fraction of 60-65%. The cavity size was normal. There is moderately increased left ventricular wall thickness. Left ventricular diastolic Doppler parameters are consistent  with impaired relaxation.  Right Ventricle: The right ventricle has normal systolic function. The cavity was normal. There is no increase in right ventricular wall thickness. Right ventricular systolic pressure could not be assessed.  Left Atrium: Left atrial size was normal in size.  Right Atrium: Right atrial size was mildly dilated.  Interatrial Septum: The interatrial septum was not well visualized.  Pericardium: The pericardium was not well visualized.  Mitral Valve: The mitral valve was not well visualized. Mitral valve regurgitation is trivial by color flow Doppler.  Tricuspid Valve: The tricuspid valve is not well visualized. Tricuspid valve regurgitation is trivial by color flow Doppler.  Aortic Valve: The aortic valve is tricuspid Aortic valve regurgitation was not visualized by color flow Doppler. There is no evidence of aortic valve stenosis. Mild aortic annular calcification noted.  Pulmonic Valve: The pulmonic valve was not well visualized. Pulmonic valve regurgitation is trivial by color flow Doppler. No evidence of pulmonic stenosis.  Aorta: The aorta is normal unless otherwise noted.  Pulmonary Artery: The pulmonary artery is not well seen.  Venous: The inferior vena cava was not well visualized.    +--------------+--------++ LEFT VENTRICLE          +----------------+---------++ +--------------+--------++ Diastology                PLAX 2D                +----------------+---------++ +--------------+--------++  LV e' lateral:  8.27 cm/s LVIDd:        2.34 cm  +----------------+---------++ +--------------+--------++ LV E/e' lateral:6.2       LVIDs:        1.61 cm  +----------------+---------++ +--------------+--------++ LV e' medial:   5.87 cm/s LV PW:        1.40 cm  +----------------+---------++ +--------------+--------++ LV E/e' medial: 8.8       LV IVS:       1.46 cm  +----------------+---------++ +--------------+--------++ LVOT diam:    2.00 cm  +--------------+--------++ LV SV:        12 ml    +--------------+--------++ LV SV Index:  5.22     +--------------+--------++ LVOT Area:    3.14 cm +--------------+--------++                        +--------------+--------++  +---------------+----------++ RIGHT VENTRICLE           +---------------+----------++ RV Basal diam: 3.43 cm    +---------------+----------++ RV S prime:    12.30 cm/s +---------------+----------++ TAPSE (M-mode):3.6 cm     +---------------+----------++  +---------------+-------++-----------++ LEFT ATRIUM           Index       +---------------+-------++-----------++ LA diam:       2.80 cm1.31 cm/m  +---------------+-------++-----------++ LA Vol (A2C):  38.8 ml18.13 ml/m +---------------+-------++-----------++ LA Vol (A4C):  50.5 ml23.60 ml/m +---------------+-------++-----------++ LA Biplane Vol:44.9 ml20.98 ml/m +---------------+-------++-----------++ +------------+---------++-----------++ RIGHT ATRIUM         Index       +------------+---------++-----------++ RA Area:    20.40 cm            +------------+---------++-----------++ RA Volume:  53.70 ml 25.10 ml/m +------------+---------++-----------++   +------------------+-----------++ +---------------+--------++ AORTIC VALVE                  PULMONIC VALVE          +------------------+-----------++ +---------------+--------++ AV Area (Vmax):   2.35 cm    PV Vmax:       0.48 m/s +------------------+-----------++ +---------------+--------++ AV Area (Vmean):  2.69 cm    PV Peak grad:  0.9 mmHg +------------------+-----------++ +---------------+--------++ AV Area (VTI):    2.70 cm    RVOT Peak grad:3 mmHg   +------------------+-----------++ +---------------+--------++ AV Vmax:          111.50 cm/s +------------------+-----------++ AV Vmean:         79.550 cm/s +------------------+-----------++ AV VTI:           0.216 m     +------------------+-----------++ AV Peak Grad:     5.0 mmHg    +------------------+-----------++ AV Mean Grad:     3.0 mmHg    +------------------+-----------++ LVOT Vmax:        83.50 cm/s  +------------------+-----------++ LVOT Vmean:       68.000 cm/s +------------------+-----------++ LVOT VTI:         0.185 m     +------------------+-----------++ LVOT/AV VTI ratio:0.86        +------------------+-----------++   +-------------+-------++ AORTA                +-------------+-------++ Ao Root diam:2.60 cm +-------------+-------++  +--------------+----------++ MITRAL VALVE             +--------------+-------+ +--------------+----------++ SHUNTS                MV Area (PHT):3.06 cm   +--------------+-------+ +--------------+----------++ Systemic VTI: 0.18 m  MV PHT:       71.92 msec +--------------+-------+ +--------------+----------++ Systemic Diam:2.00 cm  MV Decel Time:248 msec   +--------------+-------+ +--------------+----------++ +--------------+----------++ MV E velocity:51.40 cm/s +--------------+----------++ MV A velocity:74.20 cm/s +--------------+----------++ MV E/A ratio: 0.69        +--------------+----------++    Nelva Bush MD Electronically signed by Nelva Bush MD Signature Date/Time: 11/27/2018/4:29:45 PM      Final     Assessment  The primary encounter diagnosis was Bilateral primary osteoarthritis of knee. Diagnoses of Primary osteoarthritis of left knee, Plantar fascia syndrome, Primary osteoarthritis of right ankle, Chronic use of opiate for therapeutic purpose, and Chronic pain syndrome were also pertinent to this visit.  Plan of Care   I am having 44 M. Gelder start on HYDROcodone-acetaminophen. I am also having her maintain her docusate sodium, esomeprazole, ProAir HFA, polyethylene glycol powder, levothyroxine, Trelegy Ellipta, montelukast, lisinopril, baclofen, and HYDROcodone-acetaminophen.  Pharmacotherapy (Medications Ordered): Meds ordered this encounter  Medications  . HYDROcodone-acetaminophen (NORCO) 7.5-325 MG tablet    Sig: Take 1 tablet by mouth every 6 (six) hours as needed for severe pain. Must last 30 days.    Dispense:  105 tablet    Refill:  0    [place] STOP ACT - Not applicable. Fill one day early if pharmacy is closed on scheduled refill date.  [name] HYDROcodone-acetaminophen (NORCO) 7.5-325 MG tablet    Sig: Take 1 tablet by mouth every 6 (six) hours as needed for severe pain. Must last 30 days.    Dispense:  105 tablet    Refill:  0    [place] STOP ACT - Not applicable. Fill one day early if pharmacy is closed on scheduled refill date.   Orders:  Orders Placed This Encounter  Procedures  . Hyalgan Knee Inj. (Schedule)    Hyalgan knee injection. Please order Hyalgan.    Standing Status:   Future    Standing Expiration Date:   04/14/2019    Scheduling Instructions:     Procedure: Intra-articular Hyalgan Knee injection            Side: LEFT     Sedation: None     Timeframe: 4 weeks    Order Specific Question:   Where will this procedure be performed?    Answer:   [facility]   Follow-up plan:   Return in  about 5 weeks (around 04/18/2019) for L knee hyalgan #1.     Status post left genicular RFA 04/16/2018, status post right genicular RFA 03/12/2018.  Provided her with benefit.  Status post left knee intra-articular steroid injection #3 on 11/21/2018.  Trial of hyalgan for left knee     Recent Visits Date Type Provider Dept  01/10/19 Office Visit Gillis Santa, MD [facility]  Showing recent visits within past 90 days and meeting all other requirements   Today's Visits Date Type Provider Dept  03/14/19 Office Visit Gillis Santa, MD [facility]  Showing today's visits and meeting all other requirements   Future Appointments No visits were found meeting these conditions.  Showing future appointments within next 90 days and meeting all other requirements   I discussed the assessment and treatment plan with the patient. The patient was provided an opportunity to ask questions and all were answered. The patient agreed with the plan and demonstrated an understanding of the instructions.  Patient advised to call back or seek an in-person evaluation if the symptoms or condition worsens.  Total duration of non-face-to-face encounter: 25 minutes.  Note by: Gillis Santa, MD Date: 03/14/2019; Time: 8:51 AM  Note: This dictation  was prepared with Dragon dictation. Any transcriptional errors that may result from this process are unintentional.  Disclaimer:  * Given the special circumstances of the COVID-19 pandemic, the federal government has announced that the Office for Civil Rights (OCR) will exercise its enforcement discretion and will not impose penalties on physicians using telehealth in the event of noncompliance with regulatory requirements under the [place] (HIPAA) in connection with the good faith provision of telehealth during the XX123456 national public health emergency. ([place])

## 2019-03-15 ENCOUNTER — Telehealth: Payer: Self-pay

## 2019-03-15 NOTE — Telephone Encounter (Signed)
Denies any needs at this time. Instructed to call if needed. 

## 2019-04-01 ENCOUNTER — Telehealth: Payer: Self-pay

## 2019-04-01 ENCOUNTER — Ambulatory Visit: Payer: Self-pay | Admitting: *Deleted

## 2019-04-01 NOTE — Chronic Care Management (AMB) (Signed)
  Chronic Care Management   Outreach Note  04/01/2019 Name: Pamela Romero MRN: [id number] DOB: November 22, 1950  Referred by: Mikey College, NP (Inactive) Reason for referral : Chronic Care Management (Unsuccessful Outreach )   A second unsuccessful telephone outreach was attempted today. The patient was referred to the case management team for assistance with care management and care coordination. Patient has an outreach attempt scheduled with LCSW in January will ask LCSW to ask patient if she continues to need assistance from [facility].   Follow Up Plan: A HIPPA compliant phone message was UNABLE to be left for the patient, phone rang several times with no VM available.  The care management team will reach out to the patient again over the next 30 days.   Merlene Morse Toben Acuna RN, BSN Nurse Case Pharmacist, [facility]  [phone]) Business Mobile

## 2019-04-17 ENCOUNTER — Ambulatory Visit
Payer: Medicare Other | Attending: Student in an Organized Health Care Education/Training Program | Admitting: Student in an Organized Health Care Education/Training Program

## 2019-04-17 ENCOUNTER — Other Ambulatory Visit: Payer: Self-pay

## 2019-04-17 ENCOUNTER — Encounter: Payer: Self-pay | Admitting: Student in an Organized Health Care Education/Training Program

## 2019-04-17 VITALS — BP 162/106 | HR 100 | Temp 96.6°F | Resp 18 | Ht 68.0 in | Wt 222.0 lb

## 2019-04-17 DIAGNOSIS — M1712 Unilateral primary osteoarthritis, left knee: Secondary | ICD-10-CM | POA: Insufficient documentation

## 2019-04-17 MED ORDER — LIDOCAINE HCL 2 % IJ SOLN
INTRAMUSCULAR | Status: AC
  Filled 2019-04-17: qty 20

## 2019-04-17 MED ORDER — LIDOCAINE HCL 2 % IJ SOLN
20.0000 mL | Freq: Once | INTRAMUSCULAR | Status: AC
  Administered 2019-04-17: 11:00:00 400 mg

## 2019-04-17 MED ORDER — SODIUM HYALURONATE (VISCOSUP) 20 MG/2ML IX SOSY: 2.0000 mL | PREFILLED_SYRINGE | Freq: Once | INTRA_ARTICULAR | Status: AC

## 2019-04-17 NOTE — Progress Notes (Signed)
Safety precautions to be maintained throughout the outpatient stay will include: orient to surroundings, keep bed in low position, maintain call bell within reach at all times, provide assistance with transfer out of bed and ambulation.  

## 2019-04-17 NOTE — Patient Instructions (Signed)

## 2019-04-17 NOTE — Progress Notes (Signed)
Patient's Name: Pamela Romero  MRN: [id number]  Referring Provider: No ref. provider found  DOB: [birth year]  PCP: Mikey College, NP (Inactive)  DOS: 04/17/2019  Note by: Gillis Santa, MD  Service setting: Ambulatory outpatient  Specialty: Interventional Pain Management  Patient type: Established  Location: [facility] (AMB) Pain Management Facility  Visit type: Interventional Procedure   Primary Reason for Visit: Interventional Pain Management Treatment. CC: Knee Pain (left)  Procedure:          Anesthesia, Analgesia, Anxiolysis:  Type: Diagnostic Intra-Articular Hyalgan Knee Injection #1  Region: Medial infrapatellar Knee Region Level: Knee Joint Laterality: Left knee  Type: Local Anesthesia Indication(s): Analgesia         Local Anesthetic: Lidocaine 1-2% Route: Infiltration ([place]/IM) IV Access: Declined Sedation: Declined   Position: Sitting   Indications: 1. Primary osteoarthritis of left knee    Pain Score: Pre-procedure: 10-Worst pain ever/10 Post-procedure: 3/10   Pre-op Assessment:  Pamela Romero is a 69 y.o. (year old), female patient, seen today for interventional treatment. She  has a past surgical history that includes Thyroidectomy (2005); Cardiac catheterization (2006); Total abdominal hysterectomy; Breast surgery (Right, March 2014); and Breast cyst aspiration (Right). Ms. [name] has a current medication list which includes the following prescription(s): baclofen, docusate sodium, esomeprazole, trelegy ellipta, hydrocodone-acetaminophen, [START ON 04/25/2019] hydrocodone-acetaminophen, levothyroxine, lisinopril, montelukast, polyethylene glycol powder, and proair hfa. Her primarily concern today is the Knee Pain (left)  Initial Vital Signs:  Pulse/HCG Rate: 100  Temp: (!) 96.6 F (35.9 C) Resp: 18 BP: (!) 161/111 SpO2: 97 %  BMI: Estimated body mass index is 33.75 kg/m as calculated from the following:   Height as of this encounter: 5\' 8"  (1.727 m).   Weight  as of this encounter: 222 lb (100.7 kg).  Risk Assessment: Allergies: Reviewed. She has No Known Allergies.  Allergy Precautions: None required Coagulopathies: Reviewed. None identified.  Blood-thinner therapy: None at this time Active Infection(s): Reviewed. None identified. Pamela Romero is afebrile  Site Confirmation: Pamela Romero was asked to confirm the procedure and laterality before marking the site Procedure checklist: Completed Consent: Before the procedure and under the influence of no sedative(s), amnesic(s), or anxiolytics, the patient was informed of the treatment options, risks and possible complications. To fulfill our ethical and legal obligations, as recommended by the American Medical Association's Code of Ethics, I have informed the patient of my clinical impression; the nature and purpose of the treatment or procedure; the risks, benefits, and possible complications of the intervention; the alternatives, including doing nothing; the risk(s) and benefit(s) of the alternative treatment(s) or procedure(s); and the risk(s) and benefit(s) of doing nothing. The patient was provided information about the general risks and possible complications associated with the procedure. These may include, but are not limited to: failure to achieve desired goals, infection, bleeding, organ or nerve damage, allergic reactions, paralysis, and death. In addition, the patient was informed of those risks and complications associated to the procedure, such as failure to decrease pain; infection; bleeding; organ or nerve damage with subsequent damage to sensory, motor, and/or autonomic systems, resulting in permanent pain, numbness, and/or weakness of one or several areas of the body; allergic reactions; (i.e.: anaphylactic reaction); and/or death. Furthermore, the patient was informed of those risks and complications associated with the medications. These include, but are not limited to: allergic reactions  (i.e.: anaphylactic or anaphylactoid reaction(s)); adrenal axis suppression; blood sugar elevation that in diabetics may result in ketoacidosis or comma; water retention that in  patients with history of congestive heart failure may result in shortness of breath, pulmonary edema, and decompensation with resultant heart failure; weight gain; swelling or edema; medication-induced neural toxicity; particulate matter embolism and blood vessel occlusion with resultant organ, and/or nervous system infarction; and/or aseptic necrosis of one or more joints. Finally, the patient was informed that Medicine is not an exact science; therefore, there is also the possibility of unforeseen or unpredictable risks and/or possible complications that may result in a catastrophic outcome. The patient indicated having understood very clearly. We have given the patient no guarantees and we have made no promises. Enough time was given to the patient to ask questions, all of which were answered to the patient's satisfaction. Pamela Romero has indicated that she wanted to continue with the procedure. Attestation: I, the ordering provider, attest that I have discussed with the patient the benefits, risks, side-effects, alternatives, likelihood of achieving goals, and potential problems during recovery for the procedure that I have provided informed consent. Date  Time: 04/17/2019  8:32 AM  Pre-Procedure Preparation:  Monitoring: As per clinic protocol. Respiration, ETCO2, SpO2, BP, heart rate and rhythm monitor placed and checked for adequate function Safety Precautions: Patient was assessed for positional comfort and pressure points before starting the procedure. Time-out: I initiated and conducted the "Time-out" before starting the procedure, as per protocol. The patient was asked to participate by confirming the accuracy of the "Time Out" information. Verification of the correct person, site, and procedure were performed and  confirmed by me, the nursing staff, and the patient. "Time-out" conducted as per Joint Commission's Universal Protocol (UP.01.01.01). Time: [id number]  Description of Procedure:          Target Area: Knee Joint Approach: Just above the Medial tibial plateau, lateral to the infrapatellar tendon. Area Prepped: Entire knee area, from the mid-thigh to the mid-shin. Prepping solution: DuraPrep (Iodine Povacrylex [0.7% available iodine] and Isopropyl Alcohol, 74% w/w) Safety Precautions: Aspiration looking for blood return was conducted prior to all injections. At no point did we inject any substances, as a needle was being advanced. No attempts were made at seeking any paresthesias. Safe injection practices and needle disposal techniques used. Medications properly checked for expiration dates. SDV (single dose vial) medications used. Description of the Procedure: Protocol guidelines were followed. The patient was placed in position over the fluoroscopy table. The target area was identified and the area prepped in the usual manner. Skin & deeper tissues infiltrated with local anesthetic. Appropriate amount of time allowed to pass for local anesthetics to take effect. The procedure needles were then advanced to the target area. Proper needle placement secured. Negative aspiration confirmed. Solution injected in intermittent fashion, asking for systemic symptoms every 0.5cc of injectate. The needles were then removed and the area cleansed, making sure to leave some of the prepping solution back to take advantage of its long term bactericidal properties. Vitals:   04/17/19 0835 04/17/19 0855  BP: (!) 161/111 (!) 162/106  Pulse: 100   Resp: 18   Temp: (!) 96.6 F (35.9 C)   TempSrc: Temporal   SpO2: 97%   Weight: 222 lb (100.7 kg)   Height: 5\' 8"  (1.727 m)     Start Time: 0847 hrs. End Time: 0848 hrs. Materials:  Needle(s) Type: Regular needle Gauge: 25G Length: 1.5-in Medication(s): Please see orders  for medications and dosing details. 2 cc of Hyalgan injected Imaging Guidance:          Type of Imaging Technique: None used  Indication(s): N/A Exposure Time: No patient exposure Contrast: None used. Fluoroscopic Guidance: N/A Ultrasound Guidance: N/A Interpretation: N/A  Antibiotic Prophylaxis:   Anti-infectives (From admission, onward)   None     Indication(s): None identified  Post-operative Assessment:  Post-procedure Vital Signs:  Pulse/HCG Rate: 100  Temp: (!) 96.6 F (35.9 C) Resp: 18 BP: (!) 162/106 SpO2: 97 %  EBL: None  Complications: No immediate post-treatment complications observed by team, or reported by patient.  Note: The patient tolerated the entire procedure well. A repeat set of vitals were taken after the procedure and the patient was kept under observation following institutional policy, for this type of procedure. Post-procedural neurological assessment was performed, showing return to baseline, prior to discharge. The patient was provided with post-procedure discharge instructions, including a section on how to identify potential problems. Should any problems arise concerning this procedure, the patient was given instructions to immediately contact us, at any time, without hesitation. In any case, we plan to contact the patient by telephone for a follow-up status report regarding this interventional procedure.  Comments:  No additional relevant information.  Plan of Care  Orders:  Orders Placed This Encounter  Procedures  . Hyalgan Knee Inj. (Schedule)    Hyalgan knee injection. Please order Hyalgan.    Standing Status:   Future    Standing Expiration Date:   05/18/2019    Scheduling Instructions:     Procedure: Intra-articular Hyalgan Knee injection            Side: LEFT     Sedation: None     Timeframe: in 4 weeks    Order Specific Question:   Where will this procedure be performed?    Answer:   [facility]   Medications ordered for  procedure: Meds ordered this encounter  Medications  . Sodium Hyaluronate SOSY 2 mL  . lidocaine (XYLOCAINE) 2 % (with pres) injection 400 mg   Medications administered: We administered Sodium Hyaluronate and lidocaine.  See the medical record for exact dosing, route, and time of administration.  Follow-up plan:   Return in about 5 weeks (around 05/22/2019) for Procedure: Left knee hyalgan #2.      Status post left genicular RFA 04/16/2018, status post right genicular RFA 03/12/2018.  Provided her with benefit.  Status post left knee intra-articular steroid injection #3 on 11/21/2018.   hyalgan left knee #1 on 04/17/19      Recent Visits Date Type Provider Dept  03/14/19 Office Visit Gillis Santa, MD [facility]  Showing recent visits within past 90 days and meeting all other requirements   Today's Visits Date Type Provider Dept  04/17/19 Procedure visit Gillis Santa, MD [facility]  Showing today's visits and meeting all other requirements   Future Appointments Date Type Provider Dept  05/14/19 Appointment Gillis Santa, MD [facility]  05/22/19 Appointment Gillis Santa, MD [facility]  Showing future appointments within next 90 days and meeting all other requirements   Disposition: Discharge home  Discharge Date & Time: 04/17/2019; 0850 hrs.   Primary Care Physician: Mikey College, NP (Inactive) Location: [facility] Note by: Gillis Santa, MD Date: 04/17/2019; Time: 12:59 PM  Disclaimer:  Medicine is not an exact science. The only guarantee in medicine is that nothing is guaranteed. It is important to note that the decision to proceed with this intervention was based on the information collected from the patient. The Data and conclusions were drawn from the  patient's questionnaire, the interview, and the physical examination. Because the information was provided in large part by the patient, it cannot be  guaranteed that it has not been purposely or unconsciously manipulated. Every effort has been made to obtain as much relevant data as possible for this evaluation. It is important to note that the conclusions that lead to this procedure are derived in large part from the available data. Always take into account that the treatment will also be dependent on availability of resources and existing treatment guidelines, considered by other Pain Management Practitioners as being common knowledge and practice, at the time of the intervention. For Medico-Legal purposes, it is also important to point out that variation in procedural techniques and pharmacological choices are the acceptable norm. The indications, contraindications, technique, and results of the above procedure should only be interpreted and judged by a Board-Certified Interventional Pain Specialist with extensive familiarity and expertise in the same exact procedure and technique.

## 2019-04-18 ENCOUNTER — Telehealth: Payer: Self-pay

## 2019-04-18 NOTE — Telephone Encounter (Signed)
Post procedure phone call.  Patient states she is doing fine.  

## 2019-04-25 ENCOUNTER — Telehealth: Payer: Self-pay

## 2019-05-06 ENCOUNTER — Telehealth: Payer: Self-pay

## 2019-05-13 ENCOUNTER — Encounter: Payer: Self-pay | Admitting: Student in an Organized Health Care Education/Training Program

## 2019-05-14 ENCOUNTER — Encounter: Payer: Self-pay | Admitting: Student in an Organized Health Care Education/Training Program

## 2019-05-14 ENCOUNTER — Other Ambulatory Visit: Payer: Self-pay

## 2019-05-14 ENCOUNTER — Ambulatory Visit
Payer: Medicare Other | Attending: Student in an Organized Health Care Education/Training Program | Admitting: Student in an Organized Health Care Education/Training Program

## 2019-05-14 DIAGNOSIS — M170 Bilateral primary osteoarthritis of knee: Secondary | ICD-10-CM

## 2019-05-14 DIAGNOSIS — G894 Chronic pain syndrome: Secondary | ICD-10-CM

## 2019-05-14 DIAGNOSIS — E669 Obesity, unspecified: Secondary | ICD-10-CM | POA: Diagnosis not present

## 2019-05-14 DIAGNOSIS — Z6830 Body mass index (BMI) 30.0-30.9, adult: Secondary | ICD-10-CM

## 2019-05-14 DIAGNOSIS — M1712 Unilateral primary osteoarthritis, left knee: Secondary | ICD-10-CM

## 2019-05-14 DIAGNOSIS — M19071 Primary osteoarthritis, right ankle and foot: Secondary | ICD-10-CM

## 2019-05-14 DIAGNOSIS — G8929 Other chronic pain: Secondary | ICD-10-CM

## 2019-05-14 DIAGNOSIS — E538 Deficiency of other specified B group vitamins: Secondary | ICD-10-CM

## 2019-05-14 MED ORDER — HYDROCODONE-ACETAMINOPHEN 7.5-325 MG PO TABS: 1.0000 | ORAL_TABLET | Freq: Four times a day (QID) | ORAL | 0 refills | Status: AC | PRN

## 2019-05-14 MED ORDER — HYDROCODONE-ACETAMINOPHEN 7.5-325 MG PO TABS: 1.0000 | ORAL_TABLET | Freq: Four times a day (QID) | ORAL | 0 refills | Status: DC | PRN

## 2019-05-14 NOTE — Progress Notes (Signed)
Patient: Pamela Romero  Service Category: E/M  Provider: Gillis Santa, MD  DOB: [birth year]  DOS: 05/14/2019  Location: Office  MRN: [id number]  Setting: Ambulatory outpatient  Referring Provider: No ref. provider found  Type: Established Patient  Specialty: Interventional Pain Management  PCP: Mikey College, NP (Inactive)  Location: Home  Delivery: TeleHealth     Virtual Encounter - Pain Management PROVIDER NOTE: Information contained herein reflects review and annotations entered in association with encounter. Interpretation of such information and data should be left to medically-trained personnel. Information provided to patient can be located elsewhere in the medical record under "Patient Instructions". Document created using STT-dictation technology, any transcriptional errors that may result from process are unintentional.    Contact & Pharmacy Preferred: [phone] Home: [phone] (home) Mobile: [phone] (mobile) E-mail: No e-mail address on record  [id number] - [place], [place] - [address] [address] Phone: [phone] Fax: [phone]  [facility] [place] [place] [address] [place] [address] Phone: [phone] Fax: [phone]   Pre-screening  Pamela Romero offered "in-person" vs "virtual" encounter. She indicated preferring virtual for this encounter.   Reason COVID-19*  Social distancing based on CDC and AMA recommendations.   I contacted Pamela Romero on 05/14/2019 via video conference.      I clearly identified myself as Gillis Santa, MD. I verified that I was speaking with the correct person using two identifiers (Name: Pamela Romero, and date of birth: 1952/67/16).  Consent I sought verbal advanced consent from Pamela Romero for virtual visit interactions. I informed Pamela Romero of possible security and privacy concerns, risks, and limitations associated with  providing "not-in-person" medical evaluation and management services. I also informed Pamela Romero of the availability of "in-person" appointments. Finally, I informed her that there would be a charge for the virtual visit and that she could be  personally, fully or partially, financially responsible for it. Pamela Romero expressed understanding and agreed to proceed.   Historic Elements   Pamela Romero is a 69 y.o. year old, female patient evaluated today after her last encounter by our practice on 04/18/2019. Pamela Romero  has a past medical history of Abnormal EKG, CAD (coronary artery disease), Cancer ([place]), Endometrial adenocarcinoma ([place]) (2011), Hyperlipidemia, Hypertension, Osteoarthritis, Reflux gastritis, Thyroid disease, tobacco abuse, and Vaginal delivery. She also  has a past surgical history that includes Thyroidectomy (2005); Cardiac catheterization (2006); Total abdominal hysterectomy; Breast surgery (Right, March 2014); and Breast cyst aspiration (Right). Pamela Romero has a current medication list which includes the following prescription(s): baclofen, docusate sodium, esomeprazole, trelegy ellipta, [START ON 05/25/2019] hydrocodone-acetaminophen, [START ON 06/24/2019] hydrocodone-acetaminophen, [START ON 07/24/2019] hydrocodone-acetaminophen, levothyroxine, lisinopril, montelukast, polyethylene glycol powder, and proair hfa. She  reports that she quit smoking about a year ago. She has a 25.00 pack-year smoking history. She has never used smokeless tobacco. She reports that she does not drink alcohol or use drugs. Pamela Romero has No Known Allergies.   HPI  Today, she is being contacted for both, medication management and a post-procedure assessment.   No change in medical history since last visit.  Patient's pain is at baseline.  Patient continues multimodal pain regimen as prescribed.  States that it provides pain relief and improvement in functional status.  Patient is s/p  IA left  knee hyalgan #1 on 04/17/19. It endorsing some benefit. Plan for #2 next week.  Pharmacotherapy Assessment  Analgesic:  04/25/2019  1   03/14/2019  Hydrocodone-Acetamin 7.5-325  105.00  30 Bi Lat   [id number]   Nor (1409)   0  26.25 MME  Medicare   [place]     Monitoring: Pharmacotherapy: No side-effects or adverse reactions reported. [place] PMP: PDMP reviewed during this encounter.       Compliance: No problems identified. Effectiveness: Clinically acceptable. Plan: Refer to "POC".  UDS:   Summary  Date Value Ref Range Status  08/30/2017 FINAL  Final    Comment:    ==================================================================== TOXASSURE COMP DRUG ANALYSIS,UR ==================================================================== Test                             Result       Flag       Units Drug Present and Declared for Prescription Verification   Tramadol                       >1479        EXPECTED   ng/mg creat   O-Desmethyltramadol            >1479        EXPECTED   ng/mg creat   N-Desmethyltramadol            >1479        EXPECTED   ng/mg creat    Source of tramadol is a prescription medication.    O-desmethyltramadol and N-desmethyltramadol are expected    metabolites of tramadol.   Diclofenac                     PRESENT      EXPECTED Drug Present not Declared for Prescription Verification   Acetaminophen                  PRESENT      UNEXPECTED Drug Absent but Declared for Prescription Verification   Duloxetine                     Not Detected UNEXPECTED ==================================================================== Test                      Result    Flag   Units      Ref Range   Creatinine              338              mg/dL      >=20 ==================================================================== Declared Medications:  The flagging and interpretation on this report are based on the  following declared medications.  Unexpected results may arise from  inaccuracies  in the declared medications.  **Note: The testing scope of this panel includes these medications:  Duloxetine  Tramadol  **Note: The testing scope of this panel does not include small to  moderate amounts of these reported medications:  Diclofenac  **Note: The testing scope of this panel does not include following  reported medications:  Docusate (Colace)  Hydrochlorothiazide (Zestoretic)  Levothyroxine (Synthroid)  Lisinopril (Zestoretic)  Omeprazole (Nexium)  Ranitidine  Rosuvastatin ==================================================================== For clinical consultation, please call [phone]. ====================================================================    Laboratory Chemistry Profile (12 mo)  Renal: 11/06/2018: BUN 16; BUN/Creatinine Ratio 13; Creat 1.26  Lab Results  Component Value Date   GFR 60.34 01/21/2015   GFRAA 51 (L) 11/06/2018   GFRNONAA 44 (L) 11/06/2018   Hepatic: No  results found for requested labs within last 8760 hours. Lab Results  Component Value Date   AST 16 11/06/2018   ALT 6 11/06/2018   Other: No results found for requested labs within last 8760 hours.  Note: Above Lab results reviewed.  Imaging  ECHOCARDIOGRAM COMPLETE   ECHOCARDIOGRAM REPORT       Patient Name:   KAMIAH AVILLA [facility] Date of Exam: 11/27/2018 Medical Rec #:  [id number]        Height:       68.5 in Accession #:    [id number]       Weight:       220.0 lb Date of Birth:  1950/10/23        BSA:          2.14 m Patient Age:    68 years         BP:           135/104 mmHg Patient Gender: F                HR:           88 bpm. Exam Location:  [facility]    Procedure: 2D Echo, Cardiac Doppler and Color Doppler  Indications:     Dyspnea 786.09   History:         Patient has prior history of Echocardiogram examinations, most                  recent 02/09/2017. CAD Risk Factors: Hypertension.                  Hyperlipidemia, tobacco abuse.   Sonographer:     Sherrie Sport  RDCS (AE) Referring Phys:  [id number] CARMEN Veda Canning Diagnosing Phys: Nelva Bush MD    Sonographer Comments: Technically challenging study due to limited acoustic windows. IMPRESSIONS   1. The left ventricle has normal systolic function with an ejection fraction of 60-65%. The cavity size was normal. There is moderately increased left ventricular wall thickness. Left ventricular diastolic Doppler parameters are consistent with impaired  relaxation.  2. The right ventricle has normal systolic function. The cavity was normal. There is no increase in right ventricular wall thickness. Right ventricular systolic pressure could not be assessed.  3. Right atrial size was mildly dilated.  4. The aortic valve is tricuspid. Mild aortic annular calcification noted.  5. The aorta is normal unless otherwise noted.  6. The interatrial septum was not well visualized.  FINDINGS  Left Ventricle: The left ventricle has normal systolic function, with an ejection fraction of 60-65%. The cavity size was normal. There is moderately increased left ventricular wall thickness. Left ventricular diastolic Doppler parameters are consistent  with impaired relaxation.  Right Ventricle: The right ventricle has normal systolic function. The cavity was normal. There is no increase in right ventricular wall thickness. Right ventricular systolic pressure could not be assessed.  Left Atrium: Left atrial size was normal in size.  Right Atrium: Right atrial size was mildly dilated.  Interatrial Septum: The interatrial septum was not well visualized.  Pericardium: The pericardium was not well visualized.  Mitral Valve: The mitral valve was not well visualized. Mitral valve regurgitation is trivial by color flow Doppler.  Tricuspid Valve: The tricuspid valve is not well visualized. Tricuspid valve regurgitation is trivial by color flow Doppler.  Aortic Valve: The aortic valve is tricuspid Aortic valve regurgitation was  not visualized by color flow Doppler. There is no evidence of aortic valve  stenosis. Mild aortic annular calcification noted.  Pulmonic Valve: The pulmonic valve was not well visualized. Pulmonic valve regurgitation is trivial by color flow Doppler. No evidence of pulmonic stenosis.  Aorta: The aorta is normal unless otherwise noted.  Pulmonary Artery: The pulmonary artery is not well seen.  Venous: The inferior vena cava was not well visualized.    +--------------+--------++ LEFT VENTRICLE         +----------------+---------++ +--------------+--------++ Diastology                PLAX 2D                +----------------+---------++ +--------------+--------++ LV e' lateral:  8.27 cm/s LVIDd:        2.34 cm  +----------------+---------++ +--------------+--------++ LV E/e' lateral:6.2       LVIDs:        1.61 cm  +----------------+---------++ +--------------+--------++ LV e' medial:   5.87 cm/s LV PW:        1.40 cm  +----------------+---------++ +--------------+--------++ LV E/e' medial: 8.8       LV IVS:       1.46 cm  +----------------+---------++ +--------------+--------++ LVOT diam:    2.00 cm  +--------------+--------++ LV SV:        12 ml    +--------------+--------++ LV SV Index:  5.22     +--------------+--------++ LVOT Area:    3.14 cm +--------------+--------++                        +--------------+--------++  +---------------+----------++ RIGHT VENTRICLE           +---------------+----------++ RV Basal diam: 3.43 cm    +---------------+----------++ RV S prime:    12.30 cm/s +---------------+----------++ TAPSE (M-mode):3.6 cm     +---------------+----------++  +---------------+-------++-----------++ LEFT ATRIUM           Index       +---------------+-------++-----------++ LA diam:       2.80 cm1.31 cm/m  +---------------+-------++-----------++ LA Vol (A2C):  38.8  ml18.13 ml/m +---------------+-------++-----------++ LA Vol (A4C):  50.5 ml23.60 ml/m +---------------+-------++-----------++ LA Biplane Vol:44.9 ml20.98 ml/m +---------------+-------++-----------++ +------------+---------++-----------++ RIGHT ATRIUM         Index       +------------+---------++-----------++ RA Area:    20.40 cm            +------------+---------++-----------++ RA Volume:  53.70 ml 25.10 ml/m +------------+---------++-----------++  +------------------+-----------++ +---------------+--------++ AORTIC VALVE                  PULMONIC VALVE          +------------------+-----------++ +---------------+--------++ AV Area (Vmax):   2.35 cm    PV Vmax:       0.48 m/s +------------------+-----------++ +---------------+--------++ AV Area (Vmean):  2.69 cm    PV Peak grad:  0.9 mmHg +------------------+-----------++ +---------------+--------++ AV Area (VTI):    2.70 cm    RVOT Peak grad:3 mmHg   +------------------+-----------++ +---------------+--------++ AV Vmax:          111.50 cm/s +------------------+-----------++ AV Vmean:         79.550 cm/s +------------------+-----------++ AV VTI:           0.216 m     +------------------+-----------++ AV Peak Grad:     5.0 mmHg    +------------------+-----------++ AV Mean Grad:     3.0 mmHg    +------------------+-----------++ LVOT Vmax:        83.50 cm/s  +------------------+-----------++ LVOT Vmean:       68.000 cm/s +------------------+-----------++ LVOT VTI:  0.185 m     +------------------+-----------++ LVOT/AV VTI ratio:0.86        +------------------+-----------++   +-------------+-------++ AORTA                +-------------+-------++ Ao Root diam:2.60 cm +-------------+-------++  +--------------+----------++ MITRAL VALVE              +--------------+-------+ +--------------+----------++ SHUNTS                MV Area (PHT):3.06 cm   +--------------+-------+ +--------------+----------++ Systemic VTI: 0.18 m  MV PHT:       71.92 msec +--------------+-------+ +--------------+----------++ Systemic Diam:2.00 cm MV Decel Time:248 msec   +--------------+-------+ +--------------+----------++ +--------------+----------++ MV E velocity:51.40 cm/s +--------------+----------++ MV A velocity:74.20 cm/s +--------------+----------++ MV E/A ratio: 0.69       +--------------+----------++    Nelva Bush MD Electronically signed by Nelva Bush MD Signature Date/Time: 11/27/2018/4:29:45 PM      Final     Assessment  The primary encounter diagnosis was Bilateral primary osteoarthritis of knee. Diagnoses of Chronic pain syndrome, Primary osteoarthritis of left knee, Primary osteoarthritis of right ankle, Chronic pain of right ankle, Class 1 obesity without serious comorbidity with body mass index (BMI) of 30.0 to 30.9 in adult, unspecified obesity type, and B12 deficiency were also pertinent to this visit.  Plan of Care   I am having Pamela Romero start on HYDROcodone-acetaminophen and HYDROcodone-acetaminophen. I am also having her maintain her docusate sodium, esomeprazole, ProAir HFA, polyethylene glycol powder, levothyroxine, Trelegy Ellipta, montelukast, lisinopril, baclofen, and HYDROcodone-acetaminophen.  Pharmacotherapy (Medications Ordered): Meds ordered this encounter  Medications  . HYDROcodone-acetaminophen (NORCO) 7.5-325 MG tablet    Sig: Take 1 tablet by mouth every 6 (six) hours as needed for severe pain. Must last 30 days.    Dispense:  105 tablet    Refill:  0    [place] STOP ACT - Not applicable. Fill one day early if pharmacy is closed on scheduled refill date.  [name] HYDROcodone-acetaminophen (NORCO) 7.5-325 MG tablet    Sig: Take 1 tablet by mouth every 6 (six) hours as  needed for severe pain. Must last 30 days.    Dispense:  105 tablet    Refill:  0    [place] STOP ACT - Not applicable. Fill one day early if pharmacy is closed on scheduled refill date.  [name] HYDROcodone-acetaminophen (NORCO) 7.5-325 MG tablet    Sig: Take 1 tablet by mouth every 6 (six) hours as needed for severe pain. Must last 30 days.    Dispense:  105 tablet    Refill:  0     STOP ACT - Not applicable. Fill one day early if pharmacy is closed on scheduled refill date.   Plan for IA hyalgan #2 (left knee) next week  Follow-up plan:   Return in about 3 months (around 08/11/2019) for Medication Management.     Status post left genicular RFA 04/16/2018, status post right genicular RFA 03/12/2018.  Provided her with benefit.  Status post left knee intra-articular steroid injection #3 on 11/21/2018.   hyalgan left knee #1 on 04/17/19       Recent Visits Date Type Provider Dept  04/17/19 Procedure visit Gillis Santa, MD [facility]  03/14/19 Office Visit Gillis Santa, MD [facility]  Showing recent visits within past 90 days and meeting all other requirements   Today's Visits Date Type Provider Dept  05/14/19 Office Visit Gillis Santa, MD [facility]  Showing today's visits and meeting all other  requirements   Future Appointments Date Type Provider Dept  05/22/19 Appointment Gillis Santa, MD [facility]  Showing future appointments within next 90 days and meeting all other requirements   I discussed the assessment and treatment plan with the patient. The patient was provided an opportunity to ask questions and all were answered. The patient agreed with the plan and demonstrated an understanding of the instructions.  Patient advised to call back or seek an in-person evaluation if the symptoms or condition worsens.  Duration of encounter: 30 minutes.  Note by: Gillis Santa, MD Date: 05/14/2019; Time: 8:39 AM

## 2019-05-21 ENCOUNTER — Ambulatory Visit: Payer: Self-pay

## 2019-05-22 ENCOUNTER — Ambulatory Visit
Payer: Medicare Other | Attending: Student in an Organized Health Care Education/Training Program | Admitting: Student in an Organized Health Care Education/Training Program

## 2019-05-22 ENCOUNTER — Other Ambulatory Visit: Payer: Self-pay

## 2019-05-22 ENCOUNTER — Encounter: Payer: Self-pay | Admitting: Student in an Organized Health Care Education/Training Program

## 2019-05-22 VITALS — BP 148/116 | HR 86 | Temp 98.0°F | Resp 18 | Ht 68.0 in | Wt 220.0 lb

## 2019-05-22 DIAGNOSIS — M1712 Unilateral primary osteoarthritis, left knee: Secondary | ICD-10-CM | POA: Diagnosis present

## 2019-05-22 MED ORDER — LIDOCAINE HCL (PF) 1 % IJ SOLN
INTRAMUSCULAR | Status: AC
  Filled 2019-05-22: qty 5

## 2019-05-22 MED ORDER — SODIUM HYALURONATE (VISCOSUP) 20 MG/2ML IX SOSY: 2.0000 mL | PREFILLED_SYRINGE | Freq: Once | INTRA_ARTICULAR | Status: AC

## 2019-05-22 MED ORDER — LIDOCAINE HCL (PF) 1 % IJ SOLN
5.0000 mL | Freq: Once | INTRAMUSCULAR | Status: AC
  Administered 2019-05-22: 5 mL

## 2019-05-22 NOTE — Progress Notes (Signed)
Patient's Name: Pamela Romero  MRN: [id number]  Referring Provider: No ref. provider found  DOB: 05-26-1950  PCP: Mikey College, NP (Inactive)  DOS: 05/22/2019  Note by: Gillis Santa, MD  Service setting: Ambulatory outpatient  Specialty: Interventional Pain Management  Patient type: Established  Location: [facility] (AMB) Pain Management Facility  Visit type: Interventional Procedure   Primary Reason for Visit: Interventional Pain Management Treatment. CC: Knee Pain (left)  Procedure:          Anesthesia, Analgesia, Anxiolysis:  Type: Diagnostic Intra-Articular Hyalgan Knee Injection #2  Region: Medial infrapatellar Knee Region Level: Knee Joint Laterality: Left knee  Type: Local Anesthesia Indication(s): Analgesia         Local Anesthetic: Lidocaine 1-2% Route: Infiltration ([place]/IM) IV Access: Declined Sedation: Declined   Position: Sitting   Indications: 1. Primary osteoarthritis of left knee    Pain Score: Pre-procedure: 5 /10 Post-procedure: 3/10   Pre-op Assessment:  Pamela Romero is a 69 y.o. (year old), female patient, seen today for interventional treatment. She  has a past surgical history that includes Thyroidectomy (2005); Cardiac catheterization (2006); Total abdominal hysterectomy; Breast surgery (Right, March 2014); and Breast cyst aspiration (Right). Pamela Romero has a current medication list which includes the following prescription(s): baclofen, docusate sodium, esomeprazole, trelegy ellipta, [START ON 05/25/2019] hydrocodone-acetaminophen, [START ON 06/24/2019] hydrocodone-acetaminophen, [START ON 07/24/2019] hydrocodone-acetaminophen, levothyroxine, lisinopril, montelukast, polyethylene glycol powder, and proair hfa. Her primarily concern today is the Knee Pain (left)  Initial Vital Signs:  Pulse/HCG Rate: 86  Temp: 98 F (36.7 C) Resp: 18 BP: (!) 148/116 SpO2: 100 %  BMI: Estimated body mass index is 33.45 kg/m as calculated from the following:   Height  as of this encounter: 5\' 8"  (1.727 m).   Weight as of this encounter: 220 lb (99.8 kg).  Risk Assessment: Allergies: Reviewed. She has No Known Allergies.  Allergy Precautions: None required Coagulopathies: Reviewed. None identified.  Blood-thinner therapy: None at this time Active Infection(s): Reviewed. None identified. Pamela Romero is afebrile  Site Confirmation: Pamela Romero was asked to confirm the procedure and laterality before marking the site Procedure checklist: Completed Consent: Before the procedure and under the influence of no sedative(s), amnesic(s), or anxiolytics, the patient was informed of the treatment options, risks and possible complications. To fulfill our ethical and legal obligations, as recommended by the American Medical Association's Code of Ethics, I have informed the patient of my clinical impression; the nature and purpose of the treatment or procedure; the risks, benefits, and possible complications of the intervention; the alternatives, including doing nothing; the risk(s) and benefit(s) of the alternative treatment(s) or procedure(s); and the risk(s) and benefit(s) of doing nothing. The patient was provided information about the general risks and possible complications associated with the procedure. These may include, but are not limited to: failure to achieve desired goals, infection, bleeding, organ or nerve damage, allergic reactions, paralysis, and death. In addition, the patient was informed of those risks and complications associated to the procedure, such as failure to decrease pain; infection; bleeding; organ or nerve damage with subsequent damage to sensory, motor, and/or autonomic systems, resulting in permanent pain, numbness, and/or weakness of one or several areas of the body; allergic reactions; (i.e.: anaphylactic reaction); and/or death. Furthermore, the patient was informed of those risks and complications associated with the medications. These  include, but are not limited to: allergic reactions (i.e.: anaphylactic or anaphylactoid reaction(s)); adrenal axis suppression; blood sugar elevation that in diabetics may result in ketoacidosis or  comma; water retention that in patients with history of congestive heart failure may result in shortness of breath, pulmonary edema, and decompensation with resultant heart failure; weight gain; swelling or edema; medication-induced neural toxicity; particulate matter embolism and blood vessel occlusion with resultant organ, and/or nervous system infarction; and/or aseptic necrosis of one or more joints. Finally, the patient was informed that Medicine is not an exact science; therefore, there is also the possibility of unforeseen or unpredictable risks and/or possible complications that may result in a catastrophic outcome. The patient indicated having understood very clearly. We have given the patient no guarantees and we have made no promises. Enough time was given to the patient to ask questions, all of which were answered to the patient's satisfaction. Pamela Romero has indicated that she wanted to continue with the procedure. Attestation: I, the ordering provider, attest that I have discussed with the patient the benefits, risks, side-effects, alternatives, likelihood of achieving goals, and potential problems during recovery for the procedure that I have provided informed consent. Date  Time: 05/22/2019  8:29 AM  Pre-Procedure Preparation:  Monitoring: As per clinic protocol. Respiration, ETCO2, SpO2, BP, heart rate and rhythm monitor placed and checked for adequate function Safety Precautions: Patient was assessed for positional comfort and pressure points before starting the procedure. Time-out: I initiated and conducted the "Time-out" before starting the procedure, as per protocol. The patient was asked to participate by confirming the accuracy of the "Time Out" information. Verification of the correct  person, site, and procedure were performed and confirmed by me, the nursing staff, and the patient. "Time-out" conducted as per Joint Commission's Universal Protocol (UP.01.01.01). Time: [id number]  Description of Procedure:          Target Area: Knee Joint Approach: Just above the Medial tibial plateau, lateral to the infrapatellar tendon. Area Prepped: Entire knee area, from the mid-thigh to the mid-shin. Prepping solution: DuraPrep (Iodine Povacrylex [0.7% available iodine] and Isopropyl Alcohol, 74% w/w) Safety Precautions: Aspiration looking for blood return was conducted prior to all injections. At no point did we inject any substances, as a needle was being advanced. No attempts were made at seeking any paresthesias. Safe injection practices and needle disposal techniques used. Medications properly checked for expiration dates. SDV (single dose vial) medications used. Description of the Procedure: Protocol guidelines were followed. The patient was placed in position over the fluoroscopy table. The target area was identified and the area prepped in the usual manner. Skin & deeper tissues infiltrated with local anesthetic. Appropriate amount of time allowed to pass for local anesthetics to take effect. The procedure needles were then advanced to the target area. Proper needle placement secured. Negative aspiration confirmed. Solution injected in intermittent fashion, asking for systemic symptoms every 0.5cc of injectate. The needles were then removed and the area cleansed, making sure to leave some of the prepping solution back to take advantage of its long term bactericidal properties. Vitals:   05/22/19 0831  BP: (!) 148/116  Pulse: 86  Resp: 18  Temp: 98 F (36.7 C)  SpO2: 100%  Weight: 220 lb (99.8 kg)  Height: 5\' 8"  (1.727 m)    Start Time: 0845 hrs. End Time: 0847 hrs. Materials:  Needle(s) Type: Regular needle Gauge: 25G Length: 1.5-in Medication(s): Please see orders for medications  and dosing details. 2 cc of Hyalgan injected Imaging Guidance:          Type of Imaging Technique: None used Indication(s): N/A Exposure Time: No patient exposure Contrast: None used.  Fluoroscopic Guidance: N/A Ultrasound Guidance: N/A Interpretation: N/A  Antibiotic Prophylaxis:   Anti-infectives (From admission, onward)   None     Indication(s): None identified  Post-operative Assessment:  Post-procedure Vital Signs:  Pulse/HCG Rate: 86  Temp: 98 F (36.7 C) Resp: 18 BP: (!) 148/116 SpO2: 100 %  EBL: None  Complications: No immediate post-treatment complications observed by team, or reported by patient.  Note: The patient tolerated the entire procedure well. A repeat set of vitals were taken after the procedure and the patient was kept under observation following institutional policy, for this type of procedure. Post-procedural neurological assessment was performed, showing return to baseline, prior to discharge. The patient was provided with post-procedure discharge instructions, including a section on how to identify potential problems. Should any problems arise concerning this procedure, the patient was given instructions to immediately contact us, at any time, without hesitation. In any case, we plan to contact the patient by telephone for a follow-up status report regarding this interventional procedure.  Comments:  No additional relevant information.  Plan of Care  Orders:  Orders Placed This Encounter  Procedures  . KNEE INJECTION    Hyalgan knee injection. Please order Hyalgan.    Standing Status:   Future    Standing Expiration Date:   06/19/2019    Scheduling Instructions:     Procedure: Intra-articular Hyalgan Knee injection            Side: LEFT     Sedation: None     Timeframe: in 6 weeks    Order Specific Question:   Where will this procedure be performed?    Answer:   [facility]   Medications ordered for procedure: Meds ordered this  encounter  Medications  . lidocaine (PF) (XYLOCAINE) 1 % injection 5 mL  . Sodium Hyaluronate SOSY 2 mL   Medications administered: We administered lidocaine (PF) and Sodium Hyaluronate.  See the medical record for exact dosing, route, and time of administration.  Follow-up plan:   Return in about 6 weeks (around 07/03/2019) for left hyalgan #3.      Status post left genicular RFA 04/16/2018, status post right genicular RFA 03/12/2018.  Provided her with benefit.  Status post left knee intra-articular steroid injection #3 on 11/21/2018.   hyalgan left knee #1 on 04/17/19, #2 05/22/19      Recent Visits Date Type Provider Dept  05/14/19 Office Visit Gillis Santa, MD [facility]  04/17/19 Procedure visit Gillis Santa, MD [facility]  03/14/19 Office Visit Gillis Santa, MD [facility]  Showing recent visits within past 90 days and meeting all other requirements   Today's Visits Date Type Provider Dept  05/22/19 Procedure visit Gillis Santa, MD [facility]  Showing today's visits and meeting all other requirements   Future Appointments Date Type Provider Dept  08/12/19 Appointment Gillis Santa, MD [facility]  Showing future appointments within next 90 days and meeting all other requirements   Disposition: Discharge home  Discharge Date & Time: 05/22/2019; 0850 hrs.   Primary Care Physician: Mikey College, NP (Inactive) Location: [facility] Outpatient Pain Management Facility Note by: Gillis Santa, MD Date: 05/22/2019; Time: 8:50 AM  Disclaimer:  Medicine is not an exact science. The only guarantee in medicine is that nothing is guaranteed. It is important to note that the decision to proceed with this intervention was based on the information collected from the patient. The Data and conclusions were drawn from the patient's questionnaire,  the interview, and the physical examination. Because the information was provided in large part by  the patient, it cannot be guaranteed that it has not been purposely or unconsciously manipulated. Every effort has been made to obtain as much relevant data as possible for this evaluation. It is important to note that the conclusions that lead to this procedure are derived in large part from the available data. Always take into account that the treatment will also be dependent on availability of resources and existing treatment guidelines, considered by other Pain Management Practitioners as being common knowledge and practice, at the time of the intervention. For Medico-Legal purposes, it is also important to point out that variation in procedural techniques and pharmacological choices are the acceptable norm. The indications, contraindications, technique, and results of the above procedure should only be interpreted and judged by a Board-Certified Interventional Pain Specialist with extensive familiarity and expertise in the same exact procedure and technique.

## 2019-05-22 NOTE — Chronic Care Management (AMB) (Signed)
  Care Management   Follow Up Note   05/22/2019 Name: Pamela Romero MRN: [id number] DOB: 06-07-1950  Referred by: Mikey College, NP (Inactive) Reason for referral : Care Coordination   Pamela Romero is a 69 y.o. year old female who is a primary care patient of Mikey College, NP (Inactive). The care management team was consulted for assistance with care management and care coordination needs.    Review of patient status, including review of consultants reports, relevant laboratory and other test results, and collaboration with appropriate care team members and the patient's provider was performed as part of comprehensive patient evaluation and provision of chronic care management services.    LCSW completed CCM outreach attempt today but was unable to reach patient successfully. A HIPPA compliant voice message was left encouraging patient to return call once available. LCSW rescheduled CCM SW appointment as well.  A HIPPA compliant phone message was left for the patient providing contact information and requesting a return call.   Pamela Romero, BSW, MSW, [email or web address] Phone: [phone]

## 2019-05-22 NOTE — Progress Notes (Signed)
Nursing Pain Medication Assessment:  Safety precautions to be maintained throughout the outpatient stay will include: orient to surroundings, keep bed in low position, maintain call bell within reach at all times, provide assistance with transfer out of bed and ambulation.  Medication Inspection Compliance: Ms. Chilcote did not comply with our request to bring her pills to be counted. She was reminded that bringing the medication bottles, even when empty, is a requirement.  Medication: None brought in. Pill/Patch Count: None available to be counted. Bottle Appearance: No container available. Did not bring bottle(s) to appointment. Filled Date: N/A Last Medication intake:  Today

## 2019-05-23 ENCOUNTER — Telehealth: Payer: Self-pay

## 2019-05-23 NOTE — Telephone Encounter (Signed)
Post procedure phone call. Patient states she is doing good.  

## 2019-05-30 ENCOUNTER — Ambulatory Visit: Payer: Medicare Other | Admitting: Family Medicine

## 2019-06-04 ENCOUNTER — Ambulatory Visit: Payer: Medicare Other | Admitting: Licensed Clinical Social Worker

## 2019-06-04 NOTE — Chronic Care Management (AMB) (Signed)
Chronic Care Management    Clinical Social Work General Note  06/04/2019 Name: Pamela Romero MRN: [id number] DOB: Aug 18, 1950  Pamela Romero is a 69 y.o. year old female who is a primary care patient of Mikey College, NP (Inactive). The CCM was consulted to assist the patient with Level of Care Concerns and Mental Health Counseling and Resources.   Pamela Romero was given information about Chronic Care Management services today including:  1. CCM service includes personalized support from designated clinical staff supervised by her physician, including individualized plan of care and coordination with other care providers 2. 24/7 contact phone numbers for assistance for urgent and routine care needs. 3. Service will only be billed when office clinical staff spend 20 minutes or more in a month to coordinate care. 4. Only one practitioner may furnish and bill the service in a calendar month. 5. The patient may stop CCM services at any time (effective at the end of the month) by phone call to the office staff. 6. The patient will be responsible for cost sharing (co-pay) of up to 20% of the service fee (after annual deductible is met).  Patient agreed to services and verbal consent obtained.   Review of patient status, including review of consultants reports, relevant laboratory and other test results, and collaboration with appropriate care team members and the patient's provider was performed as part of comprehensive patient evaluation and provision of chronic care management services.    SDOH (Social Determinants of Health) assessments and interventions performed:  Yes    Outpatient Encounter Medications as of 06/04/2019  Medication Sig  . baclofen (LIORESAL) 10 MG tablet TAKE 1 TABLET BY MOUTH THREE TIMES A DAY AS NEEDED FOR MUSCLE SPASMS  . docusate sodium (COLACE) 100 MG capsule Take 100 mg by mouth 2 (two) times daily as needed.  [name] esomeprazole (NEXIUM) 40 MG capsule Take 1  capsule (40 mg total) by mouth daily.  . Fluticasone-Umeclidin-Vilant (TRELEGY ELLIPTA) 100-62.5-25 MCG/INH AEPB Inhale 1 puff into the lungs daily.  [name] HYDROcodone-acetaminophen (NORCO) 7.5-325 MG tablet Take 1 tablet by mouth every 6 (six) hours as needed for severe pain. Must last 30 days.  [name] ON 06/24/2019] HYDROcodone-acetaminophen (NORCO) 7.5-325 MG tablet Take 1 tablet by mouth every 6 (six) hours as needed for severe pain. Must last 30 days.  [name] ON 07/24/2019] HYDROcodone-acetaminophen (NORCO) 7.5-325 MG tablet Take 1 tablet by mouth every 6 (six) hours as needed for severe pain. Must last 30 days.  [name] levothyroxine (SYNTHROID) 125 MCG tablet Take two tablets by mouth daily before breakfast with water.  [name] lisinopril (ZESTRIL) 10 MG tablet TAKE 1 TABLET BY MOUTH EVERY DAY  . montelukast (SINGULAIR) 10 MG tablet Take 1 tablet (10 mg total) by mouth at bedtime.  . polyethylene glycol powder (GLYCOLAX/MIRALAX) 17 GM/SCOOP powder Take 17 g by mouth daily as needed for mild constipation or moderate constipation.  [name] PROAIR HFA 108 (90 Base) MCG/ACT inhaler INHALE 2 PUFFS INTO THE LUNGS EVERY 4 HOURS AS NEEDED FOR WHEEZING OR SHORTNESS OF BREATH (COUGH   No facility-administered encounter medications on file as of 06/04/2019.   Patient canceled PCP appointment and is agreeable to contact office today to reschedule appointment. Patient is agreeable to [facility] as well. Patient reports recent memory loss.  Follow Up Plan: SW will follow up with patient by phone over the next quarter      [name], [place], MSW, [place]  [email or web address] Phone: [phone]

## 2019-06-07 ENCOUNTER — Other Ambulatory Visit: Payer: Self-pay

## 2019-06-10 ENCOUNTER — Other Ambulatory Visit: Payer: Self-pay

## 2019-06-10 ENCOUNTER — Encounter: Payer: Self-pay | Admitting: Family Medicine

## 2019-06-10 ENCOUNTER — Ambulatory Visit (INDEPENDENT_AMBULATORY_CARE_PROVIDER_SITE_OTHER): Payer: Medicare Other | Admitting: Family Medicine

## 2019-06-10 VITALS — BP 138/90 | HR 83 | Temp 97.9°F | Resp 16 | Ht 68.5 in | Wt 237.0 lb

## 2019-06-10 DIAGNOSIS — H9113 Presbycusis, bilateral: Secondary | ICD-10-CM | POA: Insufficient documentation

## 2019-06-10 DIAGNOSIS — K5900 Constipation, unspecified: Secondary | ICD-10-CM | POA: Diagnosis not present

## 2019-06-10 DIAGNOSIS — I10 Essential (primary) hypertension: Secondary | ICD-10-CM | POA: Diagnosis not present

## 2019-06-10 DIAGNOSIS — E890 Postprocedural hypothyroidism: Secondary | ICD-10-CM | POA: Diagnosis not present

## 2019-06-10 DIAGNOSIS — R413 Other amnesia: Secondary | ICD-10-CM | POA: Diagnosis not present

## 2019-06-10 MED ORDER — LEVOTHYROXINE SODIUM 125 MCG PO TABS: ORAL_TABLET | ORAL | 2 refills | Status: DC

## 2019-06-10 MED ORDER — POLYETHYLENE GLYCOL 3350 17 GM/SCOOP PO POWD: 17.0000 g | Freq: Every day | ORAL | 1 refills | Status: DC | PRN

## 2019-06-10 MED ORDER — LISINOPRIL 20 MG PO TABS: 20.0000 mg | ORAL_TABLET | Freq: Every day | ORAL | 0 refills | Status: DC

## 2019-06-10 NOTE — Patient Instructions (Addendum)
Thank you for coming to the office today.  Increased Lisinopril from 10 to 20mg  - new rx sent  Continue Levothyroxine thyroid, same dose, re ordered  Continue Miralax.  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 Reduced hearing over time. May need hearing test and hearing aid. No wax or problem in ears.  Memory is very mild and consistent with normal memory loss forgetfulness, but NOT dementia.  DUE for FASTING BLOOD WORK (no food or drink after midnight before the lab appointment, only water or coffee without cream/sugar on the morning of)   Please schedule a Follow-up Appointment to: Return in about 4 weeks (around 07/08/2019) for 4-6 week follow-up HTN med adjust, Thyroid, Weight - needs labs drawn, w/ Elmyra Ricks.  If you have any other questions or concerns, please feel free to call the office or send a message through [place]. You may also schedule an earlier appointment if necessary.  Additionally, you may be receiving a survey about your experience at our office within a few days to 1 week by e-mail or mail. We value your feedback.  [name], DO [place]

## 2019-06-10 NOTE — Progress Notes (Signed)
Subjective:    Patient ID: Pamela Romero, female    DOB: [birth year], 69 y.o.   MRN: [id number]  Pamela Romero is a 69 y.o. female presenting on 06/10/2019 for Memory Loss (as per patient she can't remember which is bothering her her grandma has dementia--onset year) and Hearing Loss (onset 6 month)  Previous PCP Cassell Smiles, AGPCNP-BC  HPI   Memory Loss Chronic problem for while, seems to affect her lately, first noticed while talking to her sister in law and she would be talking on phone, and she would not be able to find a particular word or not remember name of place or person. She otherwise is functioning well but this causes her stress while having conversations and worse when she cannot recall. - Maternal Grandmother had Dementia  Hearing Loss, bilateral Gradual decline with hearing. No acute change. Not endorsing any ear pain or discharge or wax problem. She wanted to have her ears checked today. Not interested in hearing aids at this time.  CHRONIC HTN: Reports elevated BP readings at home. Review chart over past 6 months,  Current Meds - Lisinopril 10mg  daily   Reports good compliance, took meds today. Tolerating well, w/o complaints. Lifestyle: - Diet: low salt - Exercise: walking, but doesn't have energy, has chronic back pain limiting her Denies CP, dyspnea, HA, edema, dizziness / lightheadedness  Hypothyroidism Chart review shows last lab TSH >75 in 10/2018. No change of medication recently, she has done well on current regimen.  Constipation Managed by [facility] on opiates, hydrocodone.  Last visit by me she was given rx Miralax with good results, need re order. Denies rectal bleeding or blood in bowel movement.   Depression screen [facility] 2/9 05/22/2019 11/21/2018 11/06/2018  Decreased Interest 0 0 3  Down, Depressed, Hopeless 0 0 3  PHQ - 2 Score 0 0 6  Altered sleeping - - 3  Tired, decreased energy - - 3  Change in appetite - - 1  Feeling bad or  failure about yourself  - - 3  Trouble concentrating - - 3  Moving slowly or fidgety/restless - - 0  Suicidal thoughts - - 1  PHQ-9 Score - - 20  Difficult doing work/chores - - Somewhat difficult    Social History   Tobacco Use  . Smoking status: Former Smoker    Packs/day: 0.50    Years: 50.00    Pack years: 25.00    Quit date: 05/11/2018    Years since quitting: 1.0  . Smokeless tobacco: Former Network engineer Use Topics  . Alcohol use: No  . Drug use: No    Review of Systems Per HPI unless specifically indicated above     Objective:    BP 138/90 (BP Location: Left Arm, Cuff Size: Normal)   Pulse 83   Temp 97.9 F (36.6 C) (Temporal)   Resp 16   Ht 5' 8.5" (1.74 m)   Wt 237 lb (107.5 kg)   SpO2 99%   BMI 35.51 kg/m   Wt Readings from Last 3 Encounters:  06/10/19 237 lb (107.5 kg)  05/22/19 220 lb (99.8 kg)  04/17/19 222 lb (100.7 kg)    Physical Exam Vitals and nursing note reviewed.  Constitutional:      General: She is not in acute distress.    Appearance: She is well-developed. She is not diaphoretic.     Comments: Well-appearing, comfortable, cooperative  HENT:     Head: Normocephalic and atraumatic.  Eyes:     General:        Right eye: No discharge.        Left eye: No discharge.     Conjunctiva/sclera: Conjunctivae normal.  Cardiovascular:     Rate and Rhythm: Normal rate.  Pulmonary:     Effort: Pulmonary effort is normal.  Skin:    General: Skin is warm and dry.     Findings: No erythema or rash.  Neurological:     Mental Status: She is alert and oriented to person, place, and time.  Psychiatric:        Behavior: Behavior normal.     Comments: Well groomed, good eye contact, normal speech and thoughts      6CIT Screen 06/10/2019  What Year? 0 points  What month? 0 points  What time? 0 points  Count back from 20 0 points  Months in reverse 0 points  Repeat phrase 2 points  Total Score 2    Results for orders placed or performed  in visit on 11/06/18  TSH  Result Value Ref Range   TSH 71.30 (H) 0.40 - 4.50 mIU/L  T4, free  Result Value Ref Range   Free T4 0.2 (L) 0.8 - 1.8 ng/dL  CBC with Differential/Platelet  Result Value Ref Range   WBC 5.9 3.8 - 10.8 Thousand/uL   RBC 3.84 3.80 - 5.10 Million/uL   Hemoglobin 12.9 11.7 - 15.5 g/dL   HCT 38.2 35.0 - 45.0 %   MCV 99.5 80.0 - 100.0 fL   MCH 33.6 (H) 27.0 - 33.0 pg   MCHC 33.8 32.0 - 36.0 g/dL   RDW 13.9 11.0 - 15.0 %   Platelets 147 140 - 400 Thousand/uL   MPV 10.5 7.5 - 12.5 fL   Neutro Abs 4,160 1,500 - 7,800 cells/uL   Lymphs Abs 1,345 850 - 3,900 cells/uL   Absolute Monocytes 319 200 - 950 cells/uL   Eosinophils Absolute 59 15 - 500 cells/uL   Basophils Absolute 18 0 - 200 cells/uL   Neutrophils Relative % 70.5 %   Total Lymphocyte 22.8 %   Monocytes Relative 5.4 %   Eosinophils Relative 1.0 %   Basophils Relative 0.3 %  COMPLETE METABOLIC PANEL WITH GFR  Result Value Ref Range   Glucose, Bld 81 65 - 99 mg/dL   BUN 16 7 - 25 mg/dL   Creat 1.26 (H) 0.50 - 0.99 mg/dL   GFR, Est Non African American 44 (L) > OR = 60 mL/min/1.69m2   GFR, Est African American 51 (L) > OR = 60 mL/min/1.78m2   BUN/Creatinine Ratio 13 6 - 22 (calc)   Sodium 141 135 - 146 mmol/L   Potassium 3.8 3.5 - 5.3 mmol/L   Chloride 101 98 - 110 mmol/L   CO2 31 20 - 32 mmol/L   Calcium 8.9 8.6 - 10.4 mg/dL   Total Protein 6.7 6.1 - 8.1 g/dL   Albumin 4.0 3.6 - 5.1 g/dL   Globulin 2.7 1.9 - 3.7 g/dL (calc)   AG Ratio 1.5 1.0 - 2.5 (calc)   Total Bilirubin 0.4 0.2 - 1.2 mg/dL   Alkaline phosphatase (APISO) 48 37 - 153 U/L   AST 16 10 - 35 U/L   ALT 6 6 - 29 U/L  Hemoglobin A1c  Result Value Ref Range   Hgb A1c MFr Bld 5.1 <5.7 % of total Hgb   Mean Plasma Glucose 100 (calc)   eAG (mmol/L) 5.5 (calc)  Assessment & Plan:   Problem List Items Addressed This Visit    Presbycusis of both ears   Memory loss - Primary   Hypothyroidism   Relevant Medications    levothyroxine (SYNTHROID) 125 MCG tablet   Essential hypertension   Relevant Medications   lisinopril (ZESTRIL) 20 MG tablet    Other Visit Diagnoses    Constipation, unspecified constipation type       Relevant Medications   polyethylene glycol powder (GLYCOLAX/MIRALAX) 17 GM/SCOOP powder      #HTN Chronic readings of elevated BP on chart review and initial reading today, repeat slight improved On minimal dose Lisinopril 10mg  daily Increase dose today from 10 to 20mg , new rx lisinopril 20mg  sent to pharmacy May benefit from 2nd agent at future visit, will start with current med for now Counseling lifestyle diet exercise low sodium  #Hypothyroidism Chronic problem, last result 10/2018 was TSH severely elevated >75 Reviewed chart, uncertain course of thyroid dose adjustments in 2020, however it seems her dose was doubled from 125 to 255mcg daily, she needs refill now, will go ahead and re order current dose Levothyroxine 166mcg x 2 = 265mcg daily - She is DUE for repeat TSH Free T4, advised that when she returns for BP and follow-up she can do fasting lab in AM and check these, and follow-up recommendations for any thyroid dose adjust at that time.  #Hearing loss, bilateral History and exam suggestive of gradual presbycusis natural hearing loss No cerumen or other problems Reassuring. May follow-up as needed in future if ready for referral to Audiology and hearing aids, she declines now.  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 6CIT dementia screening done. Reassurance given Monitor clinically and follow-up if worsening as discussed with her.  Meds ordered this encounter  Medications  . lisinopril (ZESTRIL) 20 MG tablet    Sig: Take 1 tablet (20 mg total) by mouth daily.    Dispense:  90 tablet    Refill:  0    Dose increase from  10 to 20  . polyethylene glycol powder (GLYCOLAX/MIRALAX) 17 GM/SCOOP powder    Sig: Take 17 g by mouth daily as needed for mild constipation or moderate constipation.    Dispense:  255 g    Refill:  1  . levothyroxine (SYNTHROID) 125 MCG tablet    Sig: Take two tablets by mouth daily before breakfast with water.    Dispense:  60 tablet    Refill:  2     Follow up plan: Return in about 4 weeks (around 07/08/2019) for 4-6 week follow-up HTN med adjust, Thyroid, Weight - needs labs drawn, w/ Elmyra Ricks.  She will need fasting labs including TSH / Free T4 at next apt.  [name], [facility] 06/10/2019, 2:36 PM

## 2019-06-13 ENCOUNTER — Telehealth: Payer: Medicare Other | Admitting: General Practice

## 2019-06-13 ENCOUNTER — Ambulatory Visit (INDEPENDENT_AMBULATORY_CARE_PROVIDER_SITE_OTHER): Payer: Medicare Other | Admitting: General Practice

## 2019-06-13 ENCOUNTER — Ambulatory Visit: Payer: Medicare Other | Attending: Internal Medicine

## 2019-06-13 DIAGNOSIS — I10 Essential (primary) hypertension: Secondary | ICD-10-CM

## 2019-06-13 DIAGNOSIS — Z23 Encounter for immunization: Secondary | ICD-10-CM | POA: Insufficient documentation

## 2019-06-13 DIAGNOSIS — E890 Postprocedural hypothyroidism: Secondary | ICD-10-CM

## 2019-06-13 DIAGNOSIS — J441 Chronic obstructive pulmonary disease with (acute) exacerbation: Secondary | ICD-10-CM

## 2019-06-13 NOTE — Progress Notes (Signed)
   Covid-19 Vaccination Clinic  Name:  Pamela Romero    MRN: [id number] DOB: [birth year]  06/13/2019  Pamela Romero was observed post Covid-19 immunization for 15 minutes without incident. She was provided with Vaccine Information Sheet and instruction to access the V-Safe system.   Pamela Romero was instructed to call 911 with any severe reactions post vaccine: [name] Difficulty breathing  . Swelling of face and throat  . A fast heartbeat  . A bad rash all over body  . Dizziness and weakness   Immunizations Administered    Name Date Dose VIS Date Route   Pfizer COVID-19 Vaccine 06/13/2019  8:45 AM 0.3 mL 03/22/2019 Intramuscular   Manufacturer: [employer]   Lot: [id number]   [place]: [id number]

## 2019-06-13 NOTE — Patient Instructions (Signed)
Visit Information  Goals Addressed            This Visit's Progress   . RNCM: PT- "I have not been taking my medication like I should" I need to do better.       CARE PLAN ENTRY (see longtitudinal plan of care for additional care plan information)  Current Barriers:  . Chronic Disease Management support, education, and care coordination needs related to HTN, COPD, and Hypothyroidism  . Financial Barriers   Clinical Goal(s) related to HTN, COPD, and Hypothyroidism :  Over the next 120 days, patient will:  . Work with the care management team to address educational, disease management, and care coordination needs  . Begin or continue self health monitoring activities as directed today Measure and record blood pressure 5 times per week and adhere to a Heart Healthy Diet . Call provider office for new or worsened signs and symptoms Blood pressure findings outside established parameters and New or worsened symptom related to COPD or Hypothyroidism  . Call care management team with questions or concerns . Verbalize basic understanding of patient centered plan of care established today  Interventions related to HTN, COPD, and Hypothyroidism  :  . Evaluation of current treatment plans and patient's adherence to plan as established by provider: Pt saw pcp recently and was evaluated for concerns. The patient has not always been taking her medications as prescribed. The patient is committed to changing her habits and getting on track with her health and well being  . Assessed patient understanding of disease states- education and support . Assessed patient's education and care coordination needs- the patient is receptive to the CCM team working with her to help manage her health and well being . Provided disease specific education to patient - education on taking medications as prescribed, keeping a record of blood pressure readings, adherence to a Heart Healthy diet . Collaborated with appropriate  clinical care team members regarding patient needs: Pharmacy referral for help with medication cost constraints and LCSW for help with stress and anxiety  Patient Self Care Activities related to HTN, COPD, and Hypothyroidism  :  . Patient is unable to independently self-manage chronic health conditions  Initial goal documentation        Pamela Romero was given information about Chronic Care Management services today including:  1. CCM service includes personalized support from designated clinical staff supervised by her physician, including individualized plan of care and coordination with other care providers 2. 24/7 contact phone numbers for assistance for urgent and routine care needs. 3. Service will only be billed when office clinical staff spend 20 minutes or more in a month to coordinate care. 4. Only one practitioner may furnish and bill the service in a calendar month. 5. The patient may stop CCM services at any time (effective at the end of the month) by phone call to the office staff. 6. The patient will be responsible for cost sharing (co-pay) of up to 20% of the service fee (after annual deductible is met).  Patient agreed to services and verbal consent obtained.   Patient verbalizes understanding of instructions provided today.   The care management team will reach out to the patient again over the next 60 days.   Pamela Tate RN, MSN, CCM Community Care Coordinator [place]  [facility] Mobile: [phone]  

## 2019-06-13 NOTE — Chronic Care Management (AMB) (Signed)
Chronic Care Management   Initial Visit Note  06/13/2019 Name: Pamela Romero MRN: [id number] DOB: 1951-03-02  Referred by: Mikey College, NP (Inactive) Reason for referral : Chronic Care Management (RNCM Chronic Disease Management and Care Coordination Needs: HTN/COPD/Hypothyroidism )   Pamela Romero is a 69 y.o. year old female who is a primary care patient of Mikey College, NP (Inactive). The CCM team was consulted for assistance with chronic disease management and care coordination needs related to HTN, COPD and Hypothyroidism   Review of patient status, including review of consultants reports, relevant laboratory and other test results, and collaboration with appropriate care team members and the patient's provider was performed as part of comprehensive patient evaluation and provision of chronic care management services.    SDOH (Social Determinants of Health) assessments performed: Yes See Care Plan activities for detailed interventions related to SDOH)  SDOH Interventions     Most Recent Value  SDOH Interventions  SDOH Interventions for the Following Domains  Financial Strain  Financial Strain Interventions  Other (Comment) [the patient referred to the pharmacist for help with resources for medications cost]       Medications: Outpatient Encounter Medications as of 06/13/2019  Medication Sig   baclofen (LIORESAL) 10 MG tablet TAKE 1 TABLET BY MOUTH THREE TIMES A DAY AS NEEDED FOR MUSCLE SPASMS   docusate sodium (COLACE) 100 MG capsule Take 100 mg by mouth 2 (two) times daily as needed.   esomeprazole (NEXIUM) 40 MG capsule Take 1 capsule (40 mg total) by mouth daily.   Fluticasone-Umeclidin-Vilant (TRELEGY ELLIPTA) 100-62.5-25 MCG/INH AEPB Inhale 1 puff into the lungs daily.   HYDROcodone-acetaminophen (NORCO) 7.5-325 MG tablet Take 1 tablet by mouth every 6 (six) hours as needed for severe pain. Must last 30 days.   [START ON 06/24/2019]  HYDROcodone-acetaminophen (NORCO) 7.5-325 MG tablet Take 1 tablet by mouth every 6 (six) hours as needed for severe pain. Must last 30 days.   [START ON 07/24/2019] HYDROcodone-acetaminophen (NORCO) 7.5-325 MG tablet Take 1 tablet by mouth every 6 (six) hours as needed for severe pain. Must last 30 days.   levothyroxine (SYNTHROID) 125 MCG tablet Take two tablets by mouth daily before breakfast with water.   lisinopril (ZESTRIL) 20 MG tablet Take 1 tablet (20 mg total) by mouth daily.   montelukast (SINGULAIR) 10 MG tablet Take 1 tablet (10 mg total) by mouth at bedtime.   polyethylene glycol powder (GLYCOLAX/MIRALAX) 17 GM/SCOOP powder Take 17 g by mouth daily as needed for mild constipation or moderate constipation.   PROAIR HFA 108 (90 Base) MCG/ACT inhaler INHALE 2 PUFFS INTO THE LUNGS EVERY 4 HOURS AS NEEDED FOR WHEEZING OR SHORTNESS OF BREATH (COUGH   No facility-administered encounter medications on file as of 06/13/2019.     Objective:  BP Readings from Last 3 Encounters:  06/10/19 138/90  05/22/19 (!) 148/116  04/17/19 (!) 162/106    Goals Addressed            This Visit's Progress    RNCM: PT- "I have not been taking my medication like I should" I need to do better.       CARE PLAN ENTRY (see longtitudinal plan of care for additional care plan information)  Current Barriers:   Chronic Disease Management support, education, and care coordination needs related to HTN, COPD, and Hypothyroidism   Financial Barriers   Clinical Goal(s) related to HTN, COPD, and Hypothyroidism :  Over the next 120 days, patient will:  Work with the care management team to address educational, disease management, and care coordination needs   Begin or continue self health monitoring activities as directed today Measure and record blood pressure 5 times per week and adhere to a Heart Healthy Diet  Call provider office for new or worsened signs and symptoms Blood pressure findings  outside established parameters and New or worsened symptom related to COPD or Hypothyroidism   Call care management team with questions or concerns  Verbalize basic understanding of patient centered plan of care established today  Interventions related to HTN, COPD, and Hypothyroidism  :   Evaluation of current treatment plans and patient's adherence to plan as established by provider: Pt saw pcp recently and was evaluated for concerns. The patient has not always been taking her medications as prescribed. The patient is committed to changing her habits and getting on track with her health and well being   Assessed patient understanding of disease states- education and support  Assessed patient's education and care coordination needs- the patient is receptive to the CCM team working with her to help manage her health and well being  Provided disease specific education to patient - education on taking medications as prescribed, keeping a record of blood pressure readings, adherence to a Heart Healthy diet  Collaborated with appropriate clinical care team members regarding patient needs: Pharmacy referral for help with medication cost constraints and LCSW for help with stress and anxiety  Patient Self Care Activities related to HTN, COPD, and Hypothyroidism  :   Patient is unable to independently self-manage chronic health conditions  Initial goal documentation         Ms. Oleksy was given information about Chronic Care Management services today including:  1. CCM service includes personalized support from designated clinical staff supervised by her physician, including individualized plan of care and coordination with other care providers 2. 24/7 contact phone numbers for assistance for urgent and routine care needs. 3. Service will only be billed when office clinical staff spend 20 minutes or more in a month to coordinate care. 4. Only one practitioner may furnish and bill the service  in a calendar month. 5. The patient may stop CCM services at any time (effective at the end of the month) by phone call to the office staff. 6. The patient will be responsible for cost sharing (co-pay) of up to 20% of the service fee (after annual deductible is met).  Patient agreed to services and verbal consent obtained.   Plan:   The care management team will reach out to the patient again over the next 60 days.   Noreene Larsson RN, MSN, [place] Mobile: [phone]

## 2019-06-14 ENCOUNTER — Other Ambulatory Visit: Payer: Self-pay | Admitting: Student in an Organized Health Care Education/Training Program

## 2019-06-14 DIAGNOSIS — M1712 Unilateral primary osteoarthritis, left knee: Secondary | ICD-10-CM

## 2019-07-01 ENCOUNTER — Encounter: Payer: Self-pay | Admitting: Family Medicine

## 2019-07-01 ENCOUNTER — Ambulatory Visit (INDEPENDENT_AMBULATORY_CARE_PROVIDER_SITE_OTHER): Payer: Medicare Other | Admitting: Family Medicine

## 2019-07-01 ENCOUNTER — Other Ambulatory Visit: Payer: Self-pay

## 2019-07-01 VITALS — BP 132/84 | HR 88 | Ht 68.5 in | Wt 237.0 lb

## 2019-07-01 DIAGNOSIS — IMO0002 Reserved for concepts with insufficient information to code with codable children: Secondary | ICD-10-CM

## 2019-07-01 DIAGNOSIS — G43909 Migraine, unspecified, not intractable, without status migrainosus: Secondary | ICD-10-CM | POA: Insufficient documentation

## 2019-07-01 DIAGNOSIS — I10 Essential (primary) hypertension: Secondary | ICD-10-CM | POA: Diagnosis not present

## 2019-07-01 DIAGNOSIS — G43709 Chronic migraine without aura, not intractable, without status migrainosus: Secondary | ICD-10-CM

## 2019-07-01 MED ORDER — VERAPAMIL HCL ER 120 MG PO TBCR: 120.0000 mg | EXTENDED_RELEASE_TABLET | Freq: Every day | ORAL | 2 refills | Status: DC

## 2019-07-01 MED ORDER — SUMATRIPTAN SUCCINATE 50 MG PO TABS: 50.0000 mg | ORAL_TABLET | Freq: Once | ORAL | 2 refills | Status: DC | PRN

## 2019-07-01 MED ORDER — ONDANSETRON 4 MG PO TBDP: 4.0000 mg | ORAL_TABLET | Freq: Three times a day (TID) | ORAL | 2 refills | Status: DC | PRN

## 2019-07-01 NOTE — Progress Notes (Signed)
Virtual Visit via Telephone The purpose of this virtual visit is to provide medical care while limiting exposure to the novel coronavirus (COVID19) for both patient and office staff.  Consent was obtained for phone visit:  Yes.   Answered questions that patient had about telehealth interaction:  Yes.   I discussed the limitations, risks, security and privacy concerns of performing an evaluation and management service by telephone. I also discussed with the patient that there may be a patient responsible charge related to this service. The patient expressed understanding and agreed to proceed.  Patient Location: Home Provider Location: [facility])  ---------------------------------------------------------------------- Chief Complaint  Patient presents with  . Headache    onset week yesterday was 140/96 mm/hg    S: Reviewed CMA documentation. I have called patient and gathered additional HPI as follows:  Chronic Migraine Headache / CHRONIC HTN: History of migraine headaches for past 2+ years. Last visit 06/2019 discussed BP, his dose Lisinopril was inc from 10 to 20, also due for thyroid and blood work coming up Elevated BP often at home 138-140 / 90+ Current Meds - Lisinopril 20mg  daily   Reports good compliance, took meds today. Tolerating well, w/o complaints. Takes Excedrin migraine for PRN headaches, some relief temporary but headache seems to return Lifestyle: - Diet: low salt - Exercise: walking, but doesn't have energy, has chronic back pain limiting her Admits headaches, as described Admits nausea without vomiting Denies CP, dyspnea, edema, dizziness / lightheadedness  Denies any known or suspected exposure to person with or possibly with COVID19.  Denies any fevers, chills, sweats, body ache, cough, shortness of breath, sinus pain or pressure, abdominal pain, diarrhea  Past Medical History:  Diagnosis Date  . Abnormal EKG   . CAD (coronary  artery disease)    non critical, by cardiac cath 2006  . Cancer ([place])    endometrial Ca  . Endometrial adenocarcinoma ([place]) 2011  . Hyperlipidemia   . Hypertension   . Osteoarthritis    Right foot, Left knee (Dr. Roland Rack)  . Reflux gastritis   . Thyroid disease   . tobacco abuse   . Vaginal delivery    x 2   Social History   Tobacco Use  . Smoking status: Former Smoker    Packs/day: 0.50    Years: 50.00    Pack years: 25.00    Quit date: 05/11/2018    Years since quitting: 1.1  . Smokeless tobacco: Former Network engineer Use Topics  . Alcohol use: No  . Drug use: No    Current Outpatient Medications:  .  baclofen (LIORESAL) 10 MG tablet, TAKE 1 TABLET BY MOUTH THREE TIMES A DAY AS NEEDED FOR MUSCLE SPASMS, Disp: 60 tablet, Rfl: 1 .  docusate sodium (COLACE) 100 MG capsule, Take 100 mg by mouth 2 (two) times daily as needed., Disp: , Rfl:  .  esomeprazole (NEXIUM) 40 MG capsule, Take 1 capsule (40 mg total) by mouth daily., Disp: 30 capsule, Rfl: 3 .  Fluticasone-Umeclidin-Vilant (TRELEGY ELLIPTA) 100-62.5-25 MCG/INH AEPB, Inhale 1 puff into the lungs daily., Disp: 60 each, Rfl: 11 .  HYDROcodone-acetaminophen (NORCO) 7.5-325 MG tablet, Take 1 tablet by mouth every 6 (six) hours as needed for severe pain. Must last 30 days., Disp: 105 tablet, Rfl: 0 .  [START ON 07/24/2019] HYDROcodone-acetaminophen (NORCO) 7.5-325 MG tablet, Take 1 tablet by mouth every 6 (six) hours as needed for severe pain. Must last 30 days., Disp: 105 tablet, Rfl: 0 .  levothyroxine (SYNTHROID) 125 MCG tablet, Take two tablets by mouth daily before breakfast with water., Disp: 60 tablet, Rfl: 2 .  lisinopril (ZESTRIL) 20 MG tablet, Take 1 tablet (20 mg total) by mouth daily., Disp: 90 tablet, Rfl: 0 .  polyethylene glycol powder (GLYCOLAX/MIRALAX) 17 GM/SCOOP powder, Take 17 g by mouth daily as needed for mild constipation or moderate constipation., Disp: 255 g, Rfl: 1 .  PROAIR HFA 108 (90 Base) MCG/ACT  inhaler, INHALE 2 PUFFS INTO THE LUNGS EVERY 4 HOURS AS NEEDED FOR WHEEZING OR SHORTNESS OF BREATH (COUGH, Disp: 8.5 Inhaler, Rfl: 2 .  montelukast (SINGULAIR) 10 MG tablet, Take 1 tablet (10 mg total) by mouth at bedtime. (Patient not taking: Reported on 07/01/2019), Disp: 90 tablet, Rfl: 1 .  ondansetron (ZOFRAN ODT) 4 MG disintegrating tablet, Take 1 tablet (4 mg total) by mouth every 8 (eight) hours as needed for nausea or vomiting., Disp: 30 tablet, Rfl: 2 .  SUMAtriptan (IMITREX) 50 MG tablet, Take 1 tablet (50 mg total) by mouth once as needed for up to 1 dose for migraine. May repeat one dose in 2 hours if headache persists, for max dose 24 hours, Disp: 9 tablet, Rfl: 2 .  verapamil (CALAN-SR) 120 MG CR tablet, Take 1 tablet (120 mg total) by mouth at bedtime., Disp: 30 tablet, Rfl: 2  Depression screen [facility] 2/9 07/01/2019 06/13/2019 05/22/2019  Decreased Interest 0 0 0  Down, Depressed, Hopeless 0 0 0  PHQ - 2 Score 0 0 0  Altered sleeping - - -  Tired, decreased energy - - -  Change in appetite - - -  Feeling bad or failure about yourself  - - -  Trouble concentrating - - -  Moving slowly or fidgety/restless - - -  Suicidal thoughts - - -  PHQ-9 Score - - -  Difficult doing work/chores - - -    GAD 7 : Generalized Anxiety Score 09/12/2018  Nervous, Anxious, on Edge 1  Control/stop worrying 1  Worry too much - different things 0  Trouble relaxing 1  Restless 1  Easily annoyed or irritable 1  Afraid - awful might happen 1  Total GAD 7 Score 6  Anxiety Difficulty Somewhat difficult    -------------------------------------------------------------------------- O: No physical exam performed due to remote telephone encounter.  Lab results reviewed.  No results found for this or any previous visit (from the past 2160 hour(s)).  -------------------------------------------------------------------------- A&P:  Problem List Items Addressed This Visit    Essential hypertension -  Primary   Relevant Medications   verapamil (CALAN-SR) 120 MG CR tablet   Chronic migraine   Relevant Medications   SUMAtriptan (IMITREX) 50 MG tablet   ondansetron (ZOFRAN ODT) 4 MG disintegrating tablet   verapamil (CALAN-SR) 120 MG CR tablet     #HTN Still elevated Bp readings after last visit dose inc lisinopril 10 to 20mg  Now with headaches, seem to be chronic migraine in nature, cannot rule out high BP related to headaches as well  START new med, CCB - Verapamil CR 120mg  daily, low dose for both HTN and migraine prevention Continue Lisinopril 20mg  daily Counseling lifestyle diet exercise low sodium ----  #Migraine, chronic Acute on chronic migraine headaches Worse lately, unsure exact trigger. Likely related to Bp as well Recently taking excedrin migraine PRN mixed results, but has recurrences. Assoc with nausea w/o vomiting Not on rx migraine medicines  Plan: 1. Start abortive therapy with Sumatriptan 50mg  tabs - take 1 PRN (#9, 0 refill  due to quantity limit), severe HA, may repeat dose within 2 hr if persistent, no more in 24 hours, in future can titrate dose to 50-100mg  if needed. Counseling on potential side effect / intolerance with chest discomfort acutely after taking sumatriptan 2. May take Excedrin migraine PRN still 3. Avoid triggers including foods, caffeine. Important to rest.  Note - started verapamil as listed above for HTN and migraine prophylaxis. In future may benefit from Nurtec if needed for migraines    Meds ordered this encounter  Medications  . SUMAtriptan (IMITREX) 50 MG tablet    Sig: Take 1 tablet (50 mg total) by mouth once as needed for up to 1 dose for migraine. May repeat one dose in 2 hours if headache persists, for max dose 24 hours    Dispense:  9 tablet    Refill:  2  . ondansetron (ZOFRAN ODT) 4 MG disintegrating tablet    Sig: Take 1 tablet (4 mg total) by mouth every 8 (eight) hours as needed for nausea or vomiting.    Dispense:  30  tablet    Refill:  2  . verapamil (CALAN-SR) 120 MG CR tablet    Sig: Take 1 tablet (120 mg total) by mouth at bedtime.    Dispense:  30 tablet    Refill:  2    Follow-up: Return as scheduled 3/29 w Cyndia Skeeters FNP  Will need blood draw for thyroid and other routine labs   Patient verbalizes understanding with the above medical recommendations including the limitation of remote medical advice.  Specific follow-up and call-back criteria were given for patient to follow-up or seek medical care more urgently if needed.   - Time spent in direct consultation with patient on phone: 12 minutes   [name], [facility] 07/01/2019, 11:23 AM

## 2019-07-03 ENCOUNTER — Other Ambulatory Visit: Payer: Self-pay

## 2019-07-03 ENCOUNTER — Ambulatory Visit
Payer: Medicare Other | Attending: Student in an Organized Health Care Education/Training Program | Admitting: Student in an Organized Health Care Education/Training Program

## 2019-07-03 ENCOUNTER — Encounter: Payer: Self-pay | Admitting: Student in an Organized Health Care Education/Training Program

## 2019-07-03 VITALS — BP 135/100 | HR 99 | Temp 97.0°F | Ht 69.0 in | Wt 203.0 lb

## 2019-07-03 DIAGNOSIS — M1712 Unilateral primary osteoarthritis, left knee: Secondary | ICD-10-CM | POA: Diagnosis not present

## 2019-07-03 MED ORDER — LIDOCAINE HCL (PF) 1 % IJ SOLN
5.0000 mL | Freq: Once | INTRAMUSCULAR | Status: AC
  Administered 2019-07-03: 5 mL

## 2019-07-03 MED ORDER — LIDOCAINE HCL 2 % IJ SOLN
INTRAMUSCULAR | Status: AC
  Filled 2019-07-03: qty 20

## 2019-07-03 MED ORDER — ROPIVACAINE HCL 2 MG/ML IJ SOLN
INTRAMUSCULAR | Status: AC
  Filled 2019-07-03: qty 10

## 2019-07-03 MED ORDER — SODIUM HYALURONATE (VISCOSUP) 20 MG/2ML IX SOSY: 2.0000 mL | PREFILLED_SYRINGE | Freq: Once | INTRA_ARTICULAR | Status: AC

## 2019-07-03 NOTE — Progress Notes (Signed)
Safety precautions to be maintained throughout the outpatient stay will include: orient to surroundings, keep bed in low position, maintain call bell within reach at all times, provide assistance with transfer out of bed and ambulation.  

## 2019-07-03 NOTE — Progress Notes (Signed)
Patient's Name: Pamela Romero  MRN: [id number]  Referring Provider: No ref. provider found  DOB: 08/01/1950  PCP: Mikey College, NP (Inactive)  DOS: 07/03/2019  Note by: Gillis Santa, MD  Service setting: Ambulatory outpatient  Specialty: Interventional Pain Management  Patient type: Established  Location: [facility] (AMB) Pain Management Facility  Visit type: Interventional Procedure   Primary Reason for Visit: Interventional Pain Management Treatment. CC: Knee Pain  Procedure:          Anesthesia, Analgesia, Anxiolysis:  Type: Therapeutic Intra-Articular Hyalgan Knee Injection #3  Region: Medial infrapatellar Knee Region Level: Knee Joint Laterality: Left knee  Type: Local Anesthesia Indication(s): Analgesia         Local Anesthetic: Lidocaine 1-2% Route: Infiltration (Bena/IM) IV Access: Declined Sedation: Declined   Position: Sitting   Indications: 1. Primary osteoarthritis of left knee    Pain Score: Pre-procedure: 5 /10 Post-procedure: 3/10   Pre-op Assessment:  Pamela Romero is a 69 y.o. (year old), female patient, seen today for interventional treatment. She  has a past surgical history that includes Thyroidectomy (2005); Cardiac catheterization (2006); Total abdominal hysterectomy; Breast surgery (Right, March 2014); and Breast cyst aspiration (Right). Pamela Romero has a current medication list which includes the following prescription(s): baclofen, docusate sodium, esomeprazole, trelegy ellipta, hydrocodone-acetaminophen, [START ON 07/24/2019] hydrocodone-acetaminophen, levothyroxine, lisinopril, montelukast, ondansetron, polyethylene glycol powder, proair hfa, sumatriptan, and verapamil. Her primarily concern today is the Knee Pain  Initial Vital Signs:  Pulse/HCG Rate: 99  Temp: (!) 97 F (36.1 C) Resp:   BP: (!) 135/100 SpO2: 97 %  BMI: Estimated body mass index is 29.98 kg/m as calculated from the following:   Height as of this encounter: 5\' 9"  (1.753 m).    Weight as of this encounter: 203 lb (92.1 kg).  Risk Assessment: Allergies: Reviewed. She has No Known Allergies.  Allergy Precautions: None required Coagulopathies: Reviewed. None identified.  Blood-thinner therapy: None at this time Active Infection(s): Reviewed. None identified. Pamela Romero is afebrile  Site Confirmation: Pamela Romero was asked to confirm the procedure and laterality before marking the site Procedure checklist: Completed Consent: Before the procedure and under the influence of no sedative(s), amnesic(s), or anxiolytics, the patient was informed of the treatment options, risks and possible complications. To fulfill our ethical and legal obligations, as recommended by the American Medical Association's Code of Ethics, I have informed the patient of my clinical impression; the nature and purpose of the treatment or procedure; the risks, benefits, and possible complications of the intervention; the alternatives, including doing nothing; the risk(s) and benefit(s) of the alternative treatment(s) or procedure(s); and the risk(s) and benefit(s) of doing nothing. The patient was provided information about the general risks and possible complications associated with the procedure. These may include, but are not limited to: failure to achieve desired goals, infection, bleeding, organ or nerve damage, allergic reactions, paralysis, and death. In addition, the patient was informed of those risks and complications associated to the procedure, such as failure to decrease pain; infection; bleeding; organ or nerve damage with subsequent damage to sensory, motor, and/or autonomic systems, resulting in permanent pain, numbness, and/or weakness of one or several areas of the body; allergic reactions; (i.e.: anaphylactic reaction); and/or death. Furthermore, the patient was informed of those risks and complications associated with the medications. These include, but are not limited to: allergic  reactions (i.e.: anaphylactic or anaphylactoid reaction(s)); adrenal axis suppression; blood sugar elevation that in diabetics may result in ketoacidosis or comma; water retention that  in patients with history of congestive heart failure may result in shortness of breath, pulmonary edema, and decompensation with resultant heart failure; weight gain; swelling or edema; medication-induced neural toxicity; particulate matter embolism and blood vessel occlusion with resultant organ, and/or nervous system infarction; and/or aseptic necrosis of one or more joints. Finally, the patient was informed that Medicine is not an exact science; therefore, there is also the possibility of unforeseen or unpredictable risks and/or possible complications that may result in a catastrophic outcome. The patient indicated having understood very clearly. We have given the patient no guarantees and we have made no promises. Enough time was given to the patient to ask questions, all of which were answered to the patient's satisfaction. Pamela Romero has indicated that she wanted to continue with the procedure. Attestation: I, the ordering provider, attest that I have discussed with the patient the benefits, risks, side-effects, alternatives, likelihood of achieving goals, and potential problems during recovery for the procedure that I have provided informed consent. Date  Time: 07/03/2019  8:40 AM  Pre-Procedure Preparation:  Monitoring: As per clinic protocol. Respiration, ETCO2, SpO2, BP, heart rate and rhythm monitor placed and checked for adequate function Safety Precautions: Patient was assessed for positional comfort and pressure points before starting the procedure. Time-out: I initiated and conducted the "Time-out" before starting the procedure, as per protocol. The patient was asked to participate by confirming the accuracy of the "Time Out" information. Verification of the correct person, site, and procedure were performed  and confirmed by me, the nursing staff, and the patient. "Time-out" conducted as per Joint Commission's Universal Protocol (UP.01.01.01). Time: 0903  Description of Procedure:          Target Area: Knee Joint Approach: Just above the Medial tibial plateau, lateral to the infrapatellar tendon. Area Prepped: Entire knee area, from the mid-thigh to the mid-shin. Prepping solution: DuraPrep (Iodine Povacrylex [0.7% available iodine] and Isopropyl Alcohol, 74% w/w) Safety Precautions: Aspiration looking for blood return was conducted prior to all injections. At no point did we inject any substances, as a needle was being advanced. No attempts were made at seeking any paresthesias. Safe injection practices and needle disposal techniques used. Medications properly checked for expiration dates. SDV (single dose vial) medications used. Description of the Procedure: Protocol guidelines were followed. The patient was placed in position over the fluoroscopy table. The target area was identified and the area prepped in the usual manner. Skin & deeper tissues infiltrated with local anesthetic. Appropriate amount of time allowed to pass for local anesthetics to take effect. The procedure needles were then advanced to the target area. Proper needle placement secured. Negative aspiration confirmed. Solution injected in intermittent fashion, asking for systemic symptoms every 0.5cc of injectate. The needles were then removed and the area cleansed, making sure to leave some of the prepping solution back to take advantage of its long term bactericidal properties. Vitals:   07/03/19 0843  BP: (!) 135/100  Pulse: 99  Temp: (!) 97 F (36.1 C)  SpO2: 97%  Weight: 203 lb (92.1 kg)  Height: 5\' 9"  (1.753 m)    Start Time: 0903 hrs. End Time: 0905 hrs. Materials:  Needle(s) Type: Regular needle Gauge: 25G Length: 1.5-in Medication(s): Please see orders for medications and dosing details. 2 cc of Hyalgan  injected Imaging Guidance:          Type of Imaging Technique: None used Indication(s): N/A Exposure Time: No patient exposure Contrast: None used. Fluoroscopic Guidance: N/A Ultrasound Guidance: N/A  Interpretation: N/A  Antibiotic Prophylaxis:   Anti-infectives (From admission, onward)   None     Indication(s): None identified  Post-operative Assessment:  Post-procedure Vital Signs:  Pulse/HCG Rate: 99  Temp: (!) 97 F (36.1 C) Resp:   BP: (!) 135/100 SpO2: 97 %  EBL: None  Complications: No immediate post-treatment complications observed by team, or reported by patient.  Note: The patient tolerated the entire procedure well. A repeat set of vitals were taken after the procedure and the patient was kept under observation following institutional policy, for this type of procedure. Post-procedural neurological assessment was performed, showing return to baseline, prior to discharge. The patient was provided with post-procedure discharge instructions, including a section on how to identify potential problems. Should any problems arise concerning this procedure, the patient was given instructions to immediately contact us, at any time, without hesitation. In any case, we plan to contact the patient by telephone for a follow-up status report regarding this interventional procedure.  Comments:  No additional relevant information.  Plan of Care  Orders:  No orders of the defined types were placed in this encounter.  Medications ordered for procedure: Meds ordered this encounter  Medications  . lidocaine (PF) (XYLOCAINE) 1 % injection 5 mL  . Sodium Hyaluronate SOSY 2 mL   Medications administered: We administered lidocaine (PF) and Sodium Hyaluronate.  See the medical record for exact dosing, route, and time of administration.  Follow-up plan:   Return for Keep sch. appt.      Status post left genicular RFA 04/16/2018, status post right genicular RFA 03/12/2018.  Provided  her with benefit.  Status post left knee intra-articular steroid injection #3 on 11/21/2018.   hyalgan left knee #1 on 04/17/19, #2 05/22/19, #3 07/03/2019      Recent Visits Date Type Provider Dept  05/22/19 Procedure visit Gillis Santa, MD [facility]  05/14/19 Office Visit Gillis Santa, MD [facility]  04/17/19 Procedure visit Gillis Santa, MD [facility]  Showing recent visits within past 90 days and meeting all other requirements   Today's Visits Date Type Provider Dept  07/03/19 Procedure visit Gillis Santa, MD [facility]  Showing today's visits and meeting all other requirements   Future Appointments Date Type Provider Dept  08/12/19 Appointment Gillis Santa, MD [facility]  Showing future appointments within next 90 days and meeting all other requirements   Disposition: Discharge home  Discharge Date & Time: 07/03/2019; 0907 hrs.   Primary Care Physician: Mikey College, NP (Inactive) Location: [facility] Note by: Gillis Santa, MD Date: 07/03/2019; Time: 10:38 AM  Disclaimer:  Medicine is not an exact science. The only guarantee in medicine is that nothing is guaranteed. It is important to note that the decision to proceed with this intervention was based on the information collected from the patient. The Data and conclusions were drawn from the patient's questionnaire, the interview, and the physical examination. Because the information was provided in large part by the patient, it cannot be guaranteed that it has not been purposely or unconsciously manipulated. Every effort has been made to obtain as much relevant data as possible for this evaluation. It is important to note that the conclusions that lead to this procedure are derived in large part from the available data. Always take into account that the treatment will also be dependent on availability of resources and existing treatment  guidelines, considered by other Pain Management Practitioners as being common knowledge  and practice, at the time of the intervention. For Medico-Legal purposes, it is also important to point out that variation in procedural techniques and pharmacological choices are the acceptable norm. The indications, contraindications, technique, and results of the above procedure should only be interpreted and judged by a Board-Certified Interventional Pain Specialist with extensive familiarity and expertise in the same exact procedure and technique.

## 2019-07-04 ENCOUNTER — Ambulatory Visit: Payer: Self-pay

## 2019-07-04 ENCOUNTER — Telehealth: Payer: Self-pay | Admitting: *Deleted

## 2019-07-04 NOTE — Chronic Care Management (AMB) (Signed)
  Care Management   Follow Up Note   07/04/2019 Name: ALESSI WARR MRN: [id number] DOB: [birth year]  Referred by: Mikey College, NP (Inactive) Reason for referral : Care Coordination   Effa LOCHLYNN KUSEK is a 69 y.o. year old female who is a primary care patient of Mikey College, NP (Inactive). The care management team was consulted for assistance with care management and care coordination needs.    Review of patient status, including review of consultants reports, relevant laboratory and other test results, and collaboration with appropriate care team members and the patient's provider was performed as part of comprehensive patient evaluation and provision of chronic care management services.    LCSW completed CCM outreach attempt today but was unable to reach patient successfully. A HIPPA compliant voice message was left encouraging patient to return call once available. LCSW rescheduled CCM SW appointment as well.  A HIPPA compliant phone message was left for the patient providing contact information and requesting a return call.   [name], BSW, MSW, [email or web address] Phone: [phone]

## 2019-07-04 NOTE — Telephone Encounter (Signed)
Spoke with patient re; procedure on yesterday.  No questions or concerns.  

## 2019-07-05 ENCOUNTER — Other Ambulatory Visit: Payer: Self-pay | Admitting: Family Medicine

## 2019-07-05 ENCOUNTER — Ambulatory Visit: Payer: Medicare Other | Admitting: Pharmacist

## 2019-07-05 DIAGNOSIS — I10 Essential (primary) hypertension: Secondary | ICD-10-CM | POA: Diagnosis not present

## 2019-07-05 DIAGNOSIS — J441 Chronic obstructive pulmonary disease with (acute) exacerbation: Secondary | ICD-10-CM

## 2019-07-05 DIAGNOSIS — E890 Postprocedural hypothyroidism: Secondary | ICD-10-CM

## 2019-07-05 MED ORDER — BREZTRI AEROSPHERE 160-9-4.8 MCG/ACT IN AERO: 2.0000 | INHALATION_SPRAY | Freq: Two times a day (BID) | RESPIRATORY_TRACT | 1 refills | Status: DC

## 2019-07-05 NOTE — Progress Notes (Signed)
Contact from CCM Pharmacist, Harlow Asa that patient is currently on Trelegy but insurance will not cover for her, requesting to switch to [facility] for patient.  Rx sent to pharmacy and sample placed on hold at front desk for patient.

## 2019-07-05 NOTE — Patient Instructions (Signed)
Thank you allowing the Chronic Care Management Team to be a part of your care! It was a pleasure speaking with you today!     CCM (Chronic Care Management) Team    Noreene Larsson RN, MSN, CCM Nurse Care Coordinator  [phone]   Harlow Asa PharmD  Clinical Pharmacist  [phone]   [name] LCSW Clinical Social Worker [phone]  Visit Information  Goals Addressed            This Visit's Progress   . PharmD - "I do not have any of my inhaler"       [place] (see longtitudinal plan of care for additional care plan information)   Current Barriers:  . Chronic Disease Management support, education, and care coordination needs related to HTN, COPD, and hypothyroidism . Financial Barriers  Pharmacist Clinical Goal(s):  [name] Over the next 30 days, patient will work with CM Pharmacist to address needs related to medication adherence and medication assistance  Interventions: . Schedule appointment to complete medication review, as patient does not have her medications with her today . Counsel on Medicare Part D insurance.  o Ms. Lamarre reports that she is not sure whether she has Part D coverage.  . Based on chart review, contact [facility] pharmacy for patient's coverage details o Per representative Eddie with [facility], patient's current Part D plan is: - [employer] ([place])  . Counsel on medication assistance options o Patient reports difficulty with affording Trelegy inhaler, reports that she is currently out of inhaler due to cost o Based on reported income, patient does not qualify for extra help subsidy o Based on reported out of pocket expenses for the calendar year, patient does not yet qualify for patient assistance for Trelegy through [place] o Based on reported income, patient does meet program requirements for patient assistance for [employer] inhaler through [employer] . Collaborate with PCP to recommend a switch of  patient's inhaler from Trelegy to [facility] in order for patient to obtain this medication through patient assistance and inquire about availability of Breztri sample for the patient. o PCP agrees to switch for Trelegy to [facility] for patient o Confirms sample of [place] available for patient . Attempt to reach patient to advise about change in inhaler therapy, availability of sample and review medication administration instructions, but unable to reach patient again or leave message as no voicemail picks up . Collaborate with CMA Donnie Mesa in office to provide patient with patient education handout for Breztri inhaler with sample o [name] states that she will also try to reach patient to notify . Collaborate with [facility] C[facility]T to assist patient with applying for patient assistance for [facility] through [employer]  Patient Self Care Activities:  . Unable to self administer medications as prescribed, due to cost of medication  Initial goal documentation        Patient verbalizes understanding of instructions provided today.   Telephone follow up appointment with care management team member scheduled for: 4/14 at 3 pm  Harlow Asa, PharmD, [place] [phone]

## 2019-07-05 NOTE — Chronic Care Management (AMB) (Signed)
Chronic Care Management   Follow Up Note   07/05/2019 Name: Pamela Romero MRN: [id number] DOB: July 17, 1950  Referred by: Pamela College, NP (Inactive) Reason for referral : Chronic Care Management (Initial Patient Outreach Call) and Care Coordination ([facility])   Pamela Romero is a 69 y.o. year old female who is a primary care patient of Pamela College, NP (Inactive). The CCM team was consulted for assistance with chronic disease management and care coordination needs.    Receive referral from CCM Nurse Case Manager for review of medication adherence and medication assistance.  I reached out to Pamela Romero by phone today.   Collaborate with PCP and CMA Pamela Romero in office.  Unable to reach patient again to follow up and unable to leave message as no voicemail is setup.  Review of patient status, including review of consultants reports, relevant laboratory and other test results, and collaboration with appropriate care team members and the patient's provider was performed as part of comprehensive patient evaluation and provision of chronic care management services.     Outpatient Encounter Medications as of 07/05/2019  Medication Sig  . baclofen (LIORESAL) 10 MG tablet TAKE 1 TABLET BY MOUTH THREE TIMES A DAY AS NEEDED FOR MUSCLE SPASMS  . docusate sodium (COLACE) 100 MG capsule Take 100 mg by mouth 2 (two) times daily as needed.  [name] esomeprazole (NEXIUM) 40 MG capsule Take 1 capsule (40 mg total) by mouth daily.  . Fluticasone-Umeclidin-Vilant (TRELEGY ELLIPTA) 100-62.5-25 MCG/INH AEPB Inhale 1 puff into the lungs daily.  [name] HYDROcodone-acetaminophen (NORCO) 7.5-325 MG tablet Take 1 tablet by mouth every 6 (six) hours as needed for severe pain. Must last 30 days.  [name] ON 07/24/2019] HYDROcodone-acetaminophen (NORCO) 7.5-325 MG tablet Take 1 tablet by mouth every 6 (six) hours as needed for severe pain. Must last 30 days.  [name] levothyroxine  (SYNTHROID) 125 MCG tablet Take two tablets by mouth daily before breakfast with water.  [name] lisinopril (ZESTRIL) 20 MG tablet Take 1 tablet (20 mg total) by mouth daily.  . montelukast (SINGULAIR) 10 MG tablet Take 1 tablet (10 mg total) by mouth at bedtime.  . ondansetron (ZOFRAN ODT) 4 MG disintegrating tablet Take 1 tablet (4 mg total) by mouth every 8 (eight) hours as needed for nausea or vomiting.  . polyethylene glycol powder (GLYCOLAX/MIRALAX) 17 GM/SCOOP powder Take 17 g by mouth daily as needed for mild constipation or moderate constipation.  [name] PROAIR HFA 108 (90 Base) MCG/ACT inhaler INHALE 2 PUFFS INTO THE LUNGS EVERY 4 HOURS AS NEEDED FOR WHEEZING OR SHORTNESS OF BREATH (COUGH  . SUMAtriptan (IMITREX) 50 MG tablet Take 1 tablet (50 mg total) by mouth once as needed for up to 1 dose for migraine. May repeat one dose in 2 hours if headache persists, for max dose 24 hours  . verapamil (CALAN-SR) 120 MG CR tablet Take 1 tablet (120 mg total) by mouth at bedtime.   No facility-administered encounter medications on file as of 07/05/2019.    Goals Addressed            This Visit's Progress   . PharmD - "I do not have any of my inhaler"       Pamela Romero (see longtitudinal plan of care for additional care plan information)   Current Barriers:  . Chronic Disease Management support, education, and care coordination needs related to HTN, COPD, and hypothyroidism . Financial Barriers  Pharmacist Clinical Goal(s):  [name] Over the  next 30 days, patient will work with CM Pharmacist to address needs related to medication adherence and medication assistance  Interventions: . Schedule appointment to complete medication review, as patient does not have her medications with her today . Counsel on Medicare Part D insurance.  o Pamela Romero reports that she is not sure whether she has Part D coverage.  . Based on chart review, contact [facility] pharmacy for patient's coverage  details o Per representative Pamela Romero with [facility], patient's current Part D plan is: - [employer] ([place])  . Counsel on medication assistance options o Patient reports difficulty with affording Trelegy inhaler, reports that she is currently out of inhaler due to cost o Based on reported income, patient does not qualify for extra help subsidy o Based on reported out of pocket expenses for the calendar year, patient does not yet qualify for patient assistance for Trelegy through Pamela Romero o Based on reported income, patient does meet program requirements for patient assistance for Pamela Romero inhaler through Pamela Romero . Collaborate with PCP to recommend a switch of patient's inhaler from Trelegy to [facility] in order for patient to obtain this medication through patient assistance and inquire about availability of Breztri sample for the patient. o PCP agrees to switch for Trelegy to [facility] for patient o Confirms sample of Pamela Romero available for patient . Attempt to reach patient to advise about change in inhaler therapy, availability of sample and review medication administration instructions, but unable to reach patient again or leave message as no voicemail picks up . Collaborate with CMA Pamela Romero in office to provide patient with patient education handout for Breztri inhaler with sample o Pamela Romero that she will also try to reach patient to notify . Collaborate with Pamela Romero to assist patient with applying for patient assistance for [facility] through Pamela Romero  Patient Self Care Activities:  . Unable to self administer medications as prescribed, due to cost of medication  Initial goal documentation        Plan  Telephone follow up appointment with care management team member scheduled for: 4/14 at 3 pm  Harlow Asa, PharmD, [place] [phone]

## 2019-07-06 ENCOUNTER — Ambulatory Visit: Payer: Medicare Other

## 2019-07-08 ENCOUNTER — Ambulatory Visit: Payer: Medicare Other | Admitting: Family Medicine

## 2019-07-08 ENCOUNTER — Ambulatory Visit: Payer: Medicare Other | Attending: Internal Medicine

## 2019-07-08 DIAGNOSIS — Z23 Encounter for immunization: Secondary | ICD-10-CM

## 2019-07-08 NOTE — Progress Notes (Signed)
   Covid-19 Vaccination Clinic  Name:  Pamela Romero    MRN: [id number] DOB: 1950/09/22  07/08/2019  Pamela Romero was observed post Covid-19 immunization for 15 minutes without incident. She was provided with Vaccine Information Sheet and instruction to access the V-Safe system.   Pamela Romero was instructed to call 911 with any severe reactions post vaccine: [name] Difficulty breathing  . Swelling of face and throat  . A fast heartbeat  . A bad rash all over body  . Dizziness and weakness   Immunizations Administered    Name Date Dose VIS Date Route   Pfizer COVID-19 Vaccine 07/08/2019  8:32 AM 0.3 mL 03/22/2019 Intramuscular   Manufacturer: [place]   Lot: [id number]   [place]: [id number]

## 2019-07-09 ENCOUNTER — Other Ambulatory Visit: Payer: Self-pay | Admitting: Pharmacy Technician

## 2019-07-09 ENCOUNTER — Other Ambulatory Visit: Payer: Self-pay | Admitting: Family Medicine

## 2019-07-09 NOTE — Progress Notes (Signed)
Opened in error

## 2019-07-09 NOTE — Patient Outreach (Signed)
[place] [facility]) Care Management  07/09/2019  TRINITY KILLELEA 1951-02-13 [id number]                                       Medication Assistance Referral  Referral From: [facility] Embedded RPh Dorthula Perfect   Medication/Company: [name] / AZ&ME Patient application portion:  Mailed Provider application portion: Faxed  to Cyndia Skeeters, FNP Provider address/fax verified via: Office website     Follow up:  Will follow up with patient in 10-14 business days to confirm application(s) have been received.  Safira Proffit P. Keyoni Lapinski, [place]  [phone]

## 2019-07-24 ENCOUNTER — Other Ambulatory Visit: Payer: Self-pay | Admitting: Pharmacy Technician

## 2019-07-24 ENCOUNTER — Ambulatory Visit: Payer: Self-pay | Admitting: Pharmacist

## 2019-07-24 ENCOUNTER — Telehealth: Payer: Self-pay

## 2019-07-24 DIAGNOSIS — J441 Chronic obstructive pulmonary disease with (acute) exacerbation: Secondary | ICD-10-CM

## 2019-07-24 NOTE — Patient Outreach (Signed)
[place] [facility]) Care Management  07/24/2019  MIYONNA TRINDLE 1951/01/20 [id number]    Successful call placed to gentleman that answered the phone regarding patient assistance application(s) for Breztri with [employer]ME , HIPAA compliant voicemail left.   A gentleman answered the phone and informed that patient was not at home. Offered to leave my phone number for a call back from patient and he informed it showed up on caller ID. He informed he would give her the message.  Was calling to inquire about receipt of the patient assistance application.  Follow up:  Will outreach embedded [facility] RPh Harlow Asa to inform as she has an appointment with her later today and will try again in 5-10 business days if call is not returned and/or no message is received back from embedded [place].  Kaylise Blakeley P. Ege Muckey, [place]  [phone]

## 2019-07-24 NOTE — Patient Outreach (Signed)
[place] [facility]) Care Management  07/24/2019  Pamela Romero 04-16-1950 [id number]   Incoming call from patient regarding patient assistance application(s) for [facility] with [employer]ME , HIPAA identifiers verified.   Patient informed she did not receive the application from [id number]. She informs her mail is spotty. She requested another copy to be mailed.  Prepared another copy and placed in mail.  Follow up:  Will follow up with patient in 5-10 business days.  Carrie Usery P. Turhan Chill, [place]  [phone]

## 2019-07-24 NOTE — Chronic Care Management (AMB) (Signed)
Chronic Care Management   Follow Up Note   07/24/2019 Name: LIVVY DHALIWAL MRN: [id number] DOB: 28-Sep-1950  Referred by: Mikey College, NP (Inactive) Reason for referral : Chronic Care Management (Patient Phone Call) and Care Coordination   Chavie JULIONNA ROMINGER is a 69 y.o. year old female who is a primary care patient of Mikey College, NP (Inactive). The CCM team was consulted for assistance with chronic disease management and care coordination needs.    Coordination of care with [facility] CPhT regarding medication assistance for patient.  Was unable to reach patient via telephone today and have left HIPAA message with spouse asking patient to return my call.  Review of patient status, including review of consultants reports, relevant laboratory and other test results, and collaboration with appropriate care team members and the patient's provider was performed as part of comprehensive patient evaluation and provision of chronic care management services.     Outpatient Encounter Medications as of 07/24/2019  Medication Sig  . baclofen (LIORESAL) 10 MG tablet TAKE 1 TABLET BY MOUTH THREE TIMES A DAY AS NEEDED FOR MUSCLE SPASMS  . Budeson-Glycopyrrol-Formoterol (BREZTRI AEROSPHERE) 160-9-4.8 MCG/ACT AERO Inhale 2 puffs into the lungs 2 (two) times daily.  [name] docusate sodium (COLACE) 100 MG capsule Take 100 mg by mouth 2 (two) times daily as needed.  [name] esomeprazole (NEXIUM) 40 MG capsule Take 1 capsule (40 mg total) by mouth daily.  [name] HYDROcodone-acetaminophen (NORCO) 7.5-325 MG tablet Take 1 tablet by mouth every 6 (six) hours as needed for severe pain. Must last 30 days.  [name] HYDROcodone-acetaminophen (NORCO) 7.5-325 MG tablet Take 1 tablet by mouth every 6 (six) hours as needed for severe pain. Must last 30 days.  [name] levothyroxine (SYNTHROID) 125 MCG tablet Take two tablets by mouth daily before breakfast with water.  [name] lisinopril (ZESTRIL) 20 MG tablet Take 1 tablet (20 mg total) by  mouth daily.  . montelukast (SINGULAIR) 10 MG tablet Take 1 tablet (10 mg total) by mouth at bedtime.  . ondansetron (ZOFRAN ODT) 4 MG disintegrating tablet Take 1 tablet (4 mg total) by mouth every 8 (eight) hours as needed for nausea or vomiting.  . polyethylene glycol powder (GLYCOLAX/MIRALAX) 17 GM/SCOOP powder Take 17 g by mouth daily as needed for mild constipation or moderate constipation.  [name] PROAIR HFA 108 (90 Base) MCG/ACT inhaler INHALE 2 PUFFS INTO THE LUNGS EVERY 4 HOURS AS NEEDED FOR WHEEZING OR SHORTNESS OF BREATH (COUGH  . SUMAtriptan (IMITREX) 50 MG tablet Take 1 tablet (50 mg total) by mouth once as needed for up to 1 dose for migraine. May repeat one dose in 2 hours if headache persists, for max dose 24 hours  . verapamil (CALAN-SR) 120 MG CR tablet Take 1 tablet (120 mg total) by mouth at bedtime.   No facility-administered encounter medications on file as of 07/24/2019.    Goals Addressed            This Visit's Progress   . PharmD - "I do not have any of my inhaler"       [place] (see longtitudinal plan of care for additional care plan information)   Current Barriers:  . Chronic Disease Management support, education, and care coordination needs related to HTN, COPD, and hypothyroidism . Financial Barriers  Pharmacist Clinical Goal(s):  [name] Over the next 30 days, patient will work with CM Pharmacist to address needs related to medication adherence and medication assistance  Interventions: . Collaboration of care with  [facility] CPhT to assist patient with applying for patient assistance for Breztri through [place] reports having spoken with patient this morning and application mailed on [id number] has not been received by patient.  o [facility] CPhT will mail to patient again today. . Unable to speak with patient today. Spouse reports patient is lying down as she has a headache.  Patient Self Care Activities:  . Unable to self administer medications as  prescribed, due to cost of medication  Please see past updates related to this goal by clicking on the "Past Updates" button in the selected goal         Plan  The care management team will reach out to the patient again over the next 30 days.   Harlow Asa, PharmD, [place] [employer] [phone]

## 2019-07-30 ENCOUNTER — Other Ambulatory Visit: Payer: Self-pay | Admitting: Family Medicine

## 2019-08-02 ENCOUNTER — Other Ambulatory Visit: Payer: Self-pay | Admitting: Pharmacy Technician

## 2019-08-02 NOTE — Patient Outreach (Signed)
[place] [facility]) Care Management  08/02/2019  TIMEA KOZISEK 01-Apr-1951 [id number]  Received voicemail message from [employer]ME requesting a call back in regards to patient's application for [facility] and was told to reference id [id number].  Called back and spoke to [place] who informed they had received the provider's portion of the application but not the patient's portion. Informed Estill Bamberg that it would be submitted as soon as it was made available to me. Estill Bamberg provided me the fax number to submit the information which is [phone].  Will follow up with patient as previously scheduled.  Mabry Santarelli P. Lexie Koehl, [place]  [phone]

## 2019-08-05 ENCOUNTER — Other Ambulatory Visit: Payer: Self-pay | Admitting: Pharmacy Technician

## 2019-08-05 NOTE — Patient Outreach (Signed)
[place] [facility]) Care Management  08/05/2019  [name] [birth year] [id number]   Successful call placed to patient regarding patient assistance application(s) for [facility] with [employer] , HIPAA identifiers verified.   Patient informed she is pretty certain that she received the application. Discussed the different parts of the application and what was required by the company on each page. Patient verbalized understanding and informed she would place in mail as soon as possible.  Follow up:  Will route note to embedded [facility] RPh Harlow Asa for case closure if document(s) have not been received in the next 15 business days.  Betul Brisky P. Delta Pichon, [place]  [phone]

## 2019-08-08 ENCOUNTER — Encounter: Payer: Self-pay | Admitting: Student in an Organized Health Care Education/Training Program

## 2019-08-08 ENCOUNTER — Telehealth: Payer: Medicare Other | Admitting: General Practice

## 2019-08-08 ENCOUNTER — Ambulatory Visit (INDEPENDENT_AMBULATORY_CARE_PROVIDER_SITE_OTHER): Payer: Medicare Other | Admitting: General Practice

## 2019-08-08 ENCOUNTER — Telehealth: Payer: Self-pay

## 2019-08-08 DIAGNOSIS — E890 Postprocedural hypothyroidism: Secondary | ICD-10-CM | POA: Diagnosis not present

## 2019-08-08 DIAGNOSIS — I10 Essential (primary) hypertension: Secondary | ICD-10-CM

## 2019-08-08 DIAGNOSIS — J441 Chronic obstructive pulmonary disease with (acute) exacerbation: Secondary | ICD-10-CM

## 2019-08-08 DIAGNOSIS — K5900 Constipation, unspecified: Secondary | ICD-10-CM

## 2019-08-08 NOTE — Chronic Care Management (AMB) (Signed)
Chronic Care Management   Follow Up Note   08/08/2019 Name: Pamela Romero MRN: [id number] DOB: 04-07-1951  Referred by: Mikey College, NP (Inactive) Reason for referral : Chronic Care Management (Follow up: COPD/HTN/Hypothyroidism and other concerns)   Pamela Romero is a 69 y.o. year old female who is a primary care patient of Mikey College, NP (Inactive). The CCM team was consulted for assistance with chronic disease management and care coordination needs.    Review of patient status, including review of consultants reports, relevant laboratory and other test results, and collaboration with appropriate care team members and the patient's provider was performed as part of comprehensive patient evaluation and provision of chronic care management services.    SDOH (Social Determinants of Health) assessments performed: Yes See Care Plan activities for detailed interventions related to SDOH)  SDOH Interventions     Most Recent Value  SDOH Interventions  SDOH Interventions for the Following Domains  Physical Activity  Physical Activity Interventions  Other (Comments) [limited mobility- does not do any structured activity]       Outpatient Encounter Medications as of 08/08/2019  Medication Sig   baclofen (LIORESAL) 10 MG tablet TAKE 1 TABLET BY MOUTH THREE TIMES A DAY AS NEEDED FOR MUSCLE SPASMS   Budeson-Glycopyrrol-Formoterol (BREZTRI AEROSPHERE) 160-9-4.8 MCG/ACT AERO Inhale 2 puffs into the lungs 2 (two) times daily.   docusate sodium (COLACE) 100 MG capsule Take 100 mg by mouth 2 (two) times daily as needed.   esomeprazole (NEXIUM) 40 MG capsule Take 1 capsule (40 mg total) by mouth daily.   HYDROcodone-acetaminophen (NORCO) 7.5-325 MG tablet Take 1 tablet by mouth every 6 (six) hours as needed for severe pain. Must last 30 days.   levothyroxine (SYNTHROID) 125 MCG tablet Take two tablets by mouth daily before breakfast with water.   lisinopril (ZESTRIL) 20  MG tablet Take 1 tablet (20 mg total) by mouth daily.   montelukast (SINGULAIR) 10 MG tablet Take 1 tablet (10 mg total) by mouth at bedtime.   ondansetron (ZOFRAN ODT) 4 MG disintegrating tablet Take 1 tablet (4 mg total) by mouth every 8 (eight) hours as needed for nausea or vomiting.   polyethylene glycol powder (GLYCOLAX/MIRALAX) 17 GM/SCOOP powder Take 17 g by mouth daily as needed for mild constipation or moderate constipation.   PROAIR HFA 108 (90 Base) MCG/ACT inhaler INHALE 2 PUFFS INTO THE LUNGS EVERY 4 HOURS AS NEEDED FOR WHEEZING OR SHORTNESS OF BREATH (COUGH   SUMAtriptan (IMITREX) 50 MG tablet Take 1 tablet (50 mg total) by mouth once as needed for up to 1 dose for migraine. May repeat one dose in 2 hours if headache persists, for max dose 24 hours   verapamil (CALAN-SR) 120 MG CR tablet Take 1 tablet (120 mg total) by mouth at bedtime.   No facility-administered encounter medications on file as of 08/08/2019.     Objective:  BP Readings from Last 3 Encounters:  08/08/19 (!) 148/96  07/03/19 (!) 135/100  07/01/19 132/84    Goals Addressed            This Visit's Progress    RNCM: PT- "I have not been taking my medication like I should" I need to do better.       CARE PLAN ENTRY (see longtitudinal plan of care for additional care plan information)  Current Barriers:   Chronic Disease Management support, education, and care coordination needs related to HTN, COPD, and Hypothyroidism   Financial Barriers  Clinical Goal(s) related to HTN, COPD, and Hypothyroidism :  Over the next 120 days, patient will:   Work with the care management team to address educational, disease management, and care coordination needs   Begin or continue self health monitoring activities as directed today Measure and record blood pressure 5 times per week and adhere to a Heart Healthy Diet  Call provider office for new or worsened signs and symptoms Blood pressure findings outside  established parameters and New or worsened symptom related to COPD or Hypothyroidism   Call care management team with questions or concerns  Verbalize basic understanding of patient centered plan of care established today  Interventions related to HTN, COPD, and Hypothyroidism  :   Evaluation of current treatment plans and patient's adherence to plan as established by provider: Pt saw pcp recently and was evaluated for concerns. The patient has not always been taking her medications as prescribed. The patient is committed to changing her habits and getting on track with her health and well being.  The patient endorses doing better with taking her medications and also is happy to report her blood pressure is better. Still on the high side but it is coming down with the new medication regiment. The patient is experiencing dizziness at times but it is better. Education and support given. Her specialist is working on adjusting her thyroid medication as this has been a current and on going problem for the patient.    Assessed patient understanding of disease states- education and support  Assessed patient's education and care coordination needs- the patient is receptive to the CCM team working with her to help manage her health and well being.  The patient does ask for help with OTC laxative or recommendations from pcp or pharmacist to promote healthy bowel habits. She has a hard time and the "powder" is not working well.  Will collaborate with the pcp and pharmacist.  The patient also wants recommendations from the pcp about a "coopertone belt" she has purchased for her back. She ordered the wrong size and is having to send that one back and get another one. She is wanting something that will help with her back because it is weak where she has not been exercising like she should.  Will collaborate with pcp and ask for recommendations.   Provided disease specific education to patient - education on taking  medications as prescribed, keeping a record of blood pressure readings, adherence to a Heart Healthy diet  Assessed the patient mental health. The patient is so tired of being in her home but is excited about an upcoming trip to [place] to surprise her aunt. She feels like it will be good for her to get out of her house and [place].   Collaborated with appropriate clinical care team members regarding patient needs: Pharmacy referral for help with medication cost constraints and LCSW for help with stress and anxiety  Patient Self Care Activities related to HTN, COPD, and Hypothyroidism  :   Patient is unable to independently self-manage chronic health conditions  Initial goal documentation         Plan:   The care management team will reach out to the patient again over the next 60 days.    Noreene Larsson RN, MSN, [place] Mobile: [phone]

## 2019-08-08 NOTE — Patient Instructions (Signed)
Visit Information  Goals Addressed            This Visit's Progress    RNCM: PT- "I have not been taking my medication like I should" I need to do better.       CARE PLAN ENTRY (see longtitudinal plan of care for additional care plan information)  Current Barriers:   Chronic Disease Management support, education, and care coordination needs related to HTN, COPD, and Hypothyroidism   Financial Barriers   Clinical Goal(s) related to HTN, COPD, and Hypothyroidism :  Over the next 120 days, patient will:   Work with the care management team to address educational, disease management, and care coordination needs   Begin or continue self health monitoring activities as directed today Measure and record blood pressure 5 times per week and adhere to a Heart Healthy Diet  Call provider office for new or worsened signs and symptoms Blood pressure findings outside established parameters and New or worsened symptom related to COPD or Hypothyroidism   Call care management team with questions or concerns  Verbalize basic understanding of patient centered plan of care established today  Interventions related to HTN, COPD, and Hypothyroidism  :   Evaluation of current treatment plans and patient's adherence to plan as established by provider: Pt saw pcp recently and was evaluated for concerns. The patient has not always been taking her medications as prescribed. The patient is committed to changing her habits and getting on track with her health and well being.  The patient endorses doing better with taking her medications and also is happy to report her blood pressure is better. Still on the high side but it is coming down with the new medication regiment. The patient is experiencing dizziness at times but it is better. Education and support given. Her specialist is working on adjusting her thyroid medication as this has been a current and on going problem for the patient.    Assessed patient  understanding of disease states- education and support  Assessed patient's education and care coordination needs- the patient is receptive to the CCM team working with her to help manage her health and well being.  The patient does ask for help with OTC laxative or recommendations from pcp or pharmacist to promote healthy bowel habits. She has a hard time and the "powder" is not working well.  Will collaborate with the pcp and pharmacist.  The patient also wants recommendations from the pcp about a "coopertone belt" she has purchased for her back. She ordered the wrong size and is having to send that one back and get another one. She is wanting something that will help with her back because it is weak where she has not been exercising like she should.  Will collaborate with pcp and ask for recommendations.   Provided disease specific education to patient - education on taking medications as prescribed, keeping a record of blood pressure readings, adherence to a Heart Healthy diet  Assessed the patient mental health. The patient is so tired of being in her home but is excited about an upcoming trip to [place] to surprise her aunt. She feels like it will be good for her to get out of her house and [place].   Collaborated with appropriate clinical care team members regarding patient needs: Pharmacy referral for help with medication cost constraints and LCSW for help with stress and anxiety  Patient Self Care Activities related to HTN, COPD, and Hypothyroidism  :   Patient is  unable to independently self-manage chronic health conditions  Initial goal documentation        Patient verbalizes understanding of instructions provided today.   The care management team will reach out to the patient again over the next 60 days.   Noreene Larsson RN, MSN, [place] Mobile: [phone]

## 2019-08-09 ENCOUNTER — Ambulatory Visit: Payer: Self-pay | Admitting: Pharmacist

## 2019-08-09 ENCOUNTER — Telehealth: Payer: Self-pay

## 2019-08-09 NOTE — Chronic Care Management (AMB) (Addendum)
  Chronic Care Management   Follow Up Note   08/09/2019 Name: SERI TARPINIAN MRN: [id number] DOB: Aug 21, 1950  Referred by: Mikey College, NP (Inactive) Reason for referral : Chronic Care Management (Patient Phone Call)   JADAN ANTIS is a 69 y.o. year old female who is a primary care patient of Mikey College, NP (Inactive). The CCM team was consulted for assistance with chronic disease management and care coordination needs.    Was unable to reach patient via telephone today and unable to leave a message as no voicemail picks up. (unsuccessful outreach #2)  Plan  The care management team will reach out to the patient again over the next 30 days.   Harlow Asa, PharmD, [place] [employer] [phone]

## 2019-08-12 ENCOUNTER — Ambulatory Visit
Payer: Medicare Other | Attending: Student in an Organized Health Care Education/Training Program | Admitting: Student in an Organized Health Care Education/Training Program

## 2019-08-12 ENCOUNTER — Other Ambulatory Visit: Payer: Self-pay

## 2019-08-12 ENCOUNTER — Encounter: Payer: Self-pay | Admitting: Student in an Organized Health Care Education/Training Program

## 2019-08-12 DIAGNOSIS — G894 Chronic pain syndrome: Secondary | ICD-10-CM | POA: Diagnosis not present

## 2019-08-12 DIAGNOSIS — Z79891 Long term (current) use of opiate analgesic: Secondary | ICD-10-CM | POA: Diagnosis not present

## 2019-08-12 DIAGNOSIS — M1712 Unilateral primary osteoarthritis, left knee: Secondary | ICD-10-CM | POA: Diagnosis not present

## 2019-08-12 MED ORDER — HYDROCODONE-ACETAMINOPHEN 7.5-325 MG PO TABS: 1.0000 | ORAL_TABLET | Freq: Four times a day (QID) | ORAL | 0 refills | Status: AC | PRN

## 2019-08-12 MED ORDER — HYDROCODONE-ACETAMINOPHEN 7.5-325 MG PO TABS: 1.0000 | ORAL_TABLET | Freq: Four times a day (QID) | ORAL | 0 refills | Status: DC | PRN

## 2019-08-12 NOTE — Progress Notes (Signed)
Patient: Pamela Romero  Service Category: E/M  Provider: Gillis Santa, MD  DOB: [birth year]  DOS: 08/12/2019  Location: Office  MRN: [id number]  Setting: Ambulatory outpatient  Referring Provider: No ref. provider found  Type: Established Patient  Specialty: Interventional Pain Management  PCP: Mikey College, NP (Inactive)  Location: Home  Delivery: TeleHealth     Virtual Encounter - Pain Management PROVIDER NOTE: Information contained herein reflects review and annotations entered in association with encounter. Interpretation of such information and data should be left to medically-trained personnel. Information provided to patient can be located elsewhere in the medical record under "Patient Instructions". Document created using STT-dictation technology, any transcriptional errors that may result from process are unintentional.    Contact & Pharmacy Preferred: [phone] Home: [phone] (home) Mobile: [phone] (mobile) E-mail: No e-mail address on record  [id number] [place] [address]: [phone]: [phone] [facility] [place]Phone: [phone]: [phone]  Pre-screening  Pamela Romero offered "in-person" vs "virtual" encounter. She indicated preferring virtual for this encounter.   Reason COVID-19*  Social distancing based on CDC and AMA recommendations.   I contacted Pamela FONTANILLAon 08/12/2019 via video conference.      I clearly identified myself as BGillis Santa MD. I verified that I was speaking with the correct person using two identifiers (Name: Pamela Romero and date of birth: [birth year].  Consent I sought verbal advanced consent from Pamela Romero virtual visit interactions. I informed Pamela Romero of possible security and privacy concerns, risks, and limitations associated with  providing "not-in-person" medical evaluation and management services. I also informed Pamela Romero of the availability of "in-person" appointments. Finally, I informed her that there would be a charge for the virtual visit and that she could be  personally, fully or partially, financially responsible for it. [name] understanding and agreed to proceed.   Historic Elements   Ms. Shenaya MMARGARETE HORACEis a 69y.o. year old, female patient evaluated today after her last contact with our practice on 08/08/2019. Pamela Romero a past medical history of Abnormal EKG, CAD (coronary artery disease), [place]ometrial adenocarcinoma ([place] ([place]), Hyperlipidemia, Hypertension, Osteoarthritis, Reflux gastritis, Thyroid disease, tobacco abuse, and Vaginal delivery. She also  Romero a past surgical history that includes Thyroidectomy (2005); Cardiac catheterization (2006); Total abdominal hysterectomy; Breast surgery (Right, March 2014); and Breast cyst aspiration (Right). Ms. FLandeckhas a current medication list which includes the following prescription(s): baclofen, breztri aerosphere, docusate sodium, esomeprazole, levothyroxine, lisinopril, montelukast, ondansetron, polyethylene glycol powder, proair hfa, sumatriptan, verapamil, [START ON 08/22/2019] hydrocodone-acetaminophen, [START ON 09/21/2019] hydrocodone-acetaminophen, and [START ON 10/21/2019] hydrocodone-acetaminophen. She  reports that she quit smoking about 15 months ago. She Romero a 25.00 pack-year smoking history. She Romero quit using smokeless tobacco. She reports that she does not drink alcohol or use drugs. Ms. FHarawayhas No Known Allergies.   HPI  Today, she is being contacted for medication management.   No change in medical history since last visit.  Patient's pain is at baseline.  Patient continues multimodal pain regimen as prescribed.  States that it provides pain relief and improvement in functional status.  Continues to have  left knee pain, s/p IA hyalgan #3 on 3/24, recommend patient complete series of 5.  Pharmacotherapy Assessment  Analgesic: 07/24/2019  1   05/14/2019  Hydrocodone-Acetamin 7.5-325  105.00  30 Bi Lat   [id number]   Nor (1409)   0  26.25 MME  Medicare   [place]  06/24/2019  1   05/14/2019  Hydrocodone-Acetamin 7.5-325  105.00  30 Bi Lat   [id number]   [place] (1409)   0  26.25 MME  Medicare   [place]  05/25/2019  1   05/14/2019  Hydrocodone-Acetamin 7.5-325  105.00  30 Bi Lat   [id number]   [place] (1409)   0  26.25 MME  Medicare   [place]   Monitoring: [place] PMP: PDMP reviewed during this encounter.       Pharmacotherapy: No side-effects or adverse reactions reported. Compliance: No problems identified. Effectiveness: Clinically acceptable. Plan: Refer to "POC".  UDS:  Summary  Date Value Ref Range Status  08/30/2017 FINAL  Final    Comment:    ==================================================================== TOXASSURE COMP DRUG ANALYSIS,UR ==================================================================== Test                             Result       Flag       Units Drug Present and Declared for Prescription Verification   Tramadol                       >1479        EXPECTED   ng/mg creat   O-Desmethyltramadol            >1479        EXPECTED   ng/mg creat   N-Desmethyltramadol            >1479        EXPECTED   ng/mg creat    Source of tramadol is a prescription medication.    O-desmethyltramadol and N-desmethyltramadol are expected    metabolites of tramadol.   Diclofenac                     PRESENT      EXPECTED Drug Present not Declared for Prescription Verification   Acetaminophen                  PRESENT      UNEXPECTED Drug Absent but Declared for Prescription Verification   Duloxetine                     Not Detected UNEXPECTED ==================================================================== Test                      Result    Flag   Units      Ref Range   Creatinine              338               mg/dL      >=20 ==================================================================== Declared Medications:  The flagging and interpretation on this report are based on the  following declared medications.  Unexpected results may arise from  inaccuracies in the declared medications.  **Note: The testing scope of this panel includes these medications:  Duloxetine  Tramadol  **Note: The testing scope of this panel does not include small to  moderate amounts of these reported medications:  Diclofenac  **Note: The testing scope of this panel does not include following  reported medications:  Docusate (Colace)  Hydrochlorothiazide (Zestoretic)  Levothyroxine (Synthroid)  Lisinopril (Zestoretic)  Omeprazole (Nexium)  Ranitidine  Rosuvastatin ==================================================================== For clinical consultation, please call [phone]. ====================================================================    Laboratory Chemistry Profile   Renal Lab Results  Component Value Date   BUN 16 11/06/2018   CREATININE 1.26 (H) 11/06/2018   BCR 13 11/06/2018   GFR 60.34 01/21/2015   GFRAA 51 (L) 11/06/2018   GFRNONAA 44 (L) 11/06/2018     Hepatic Lab Results  Component Value Date   AST 16 11/06/2018   ALT 6 11/06/2018   ALBUMIN 4.4 08/29/2017   ALKPHOS 126 (H) 08/29/2017   HCVAB NEGATIVE 01/21/2015     Electrolytes Lab Results  Component Value Date   NA 141 11/06/2018   K 3.8 11/06/2018   CL 101 11/06/2018   CALCIUM 8.9 11/06/2018   MG 1.9 06/04/2018     Bone Lab Results  Component Value Date   VD25OH 26.66 (L) 05/14/2014     Inflammation (CRP: Acute Phase) (ESR: Chronic Phase) Lab Results  Component Value Date   ESRSEDRATE 19 03/30/2012       Note: Above Lab results reviewed.  Imaging  ECHOCARDIOGRAM COMPLETE   ECHOCARDIOGRAM REPORT       Patient Name:   Pamela Romero [facility] Date of Exam: 11/27/2018 Medical Rec #:  [id number]         Height:       68.5 in Accession #:    [id number]       Weight:       220.0 lb Date of Birth:  09-28-1950        BSA:          2.14 m Patient Age:    73 years         BP:           135/104 mmHg Patient Gender: F                HR:           88 bpm. Exam Location:  [facility]    Procedure: 2D Echo, Cardiac Doppler and Color Doppler  Indications:     Dyspnea 786.09   History:         Patient Romero prior history of Echocardiogram examinations, most                  recent 02/09/2017. CAD Risk Factors: Hypertension.                  Hyperlipidemia, tobacco abuse.   Sonographer:     Sherrie Sport RDCS (AE) Referring Phys:  [id number] CARMEN Veda Canning Diagnosing Phys: Nelva Bush MD    Sonographer Comments: Technically challenging study due to limited acoustic windows. IMPRESSIONS   1. The left ventricle Romero normal systolic function with an ejection fraction of 60-65%. The cavity size was normal. There is moderately increased left ventricular wall thickness. Left ventricular diastolic Doppler parameters are consistent with impaired  relaxation.  2. The right ventricle Romero normal systolic function. The cavity was normal. There is no increase in right ventricular wall thickness. Right ventricular systolic pressure could not be assessed.  3. Right atrial size was mildly dilated.  4. The aortic valve is tricuspid. Mild aortic annular calcification noted.  5. The aorta is normal unless otherwise noted.  6. The interatrial septum was not well visualized.  FINDINGS  Left Ventricle: The left ventricle Romero normal systolic function, with an ejection fraction of 60-65%. The cavity size was normal. There is moderately increased left ventricular wall thickness. Left  ventricular diastolic Doppler parameters are consistent  with impaired relaxation.  Right Ventricle: The right ventricle Romero normal systolic function. The cavity was normal. There is no increase in right ventricular wall thickness. Right ventricular  systolic pressure could not be assessed.  Left Atrium: Left atrial size was normal in size.  Right Atrium: Right atrial size was mildly dilated.  Interatrial Septum: The interatrial septum was not well visualized.  Pericardium: The pericardium was not well visualized.  Mitral Valve: The mitral valve was not well visualized. Mitral valve regurgitation is trivial by color flow Doppler.  Tricuspid Valve: The tricuspid valve is not well visualized. Tricuspid valve regurgitation is trivial by color flow Doppler.  Aortic Valve: The aortic valve is tricuspid Aortic valve regurgitation was not visualized by color flow Doppler. There is no evidence of aortic valve stenosis. Mild aortic annular calcification noted.  Pulmonic Valve: The pulmonic valve was not well visualized. Pulmonic valve regurgitation is trivial by color flow Doppler. No evidence of pulmonic stenosis.  Aorta: The aorta is normal unless otherwise noted.  Pulmonary Artery: The pulmonary artery is not well seen.  Venous: The inferior vena cava was not well visualized.    +--------------+--------++ LEFT VENTRICLE         +----------------+---------++ +--------------+--------++ Diastology                PLAX 2D                +----------------+---------++ +--------------+--------++ LV e' lateral:  8.27 cm/s LVIDd:        2.34 cm  +----------------+---------++ +--------------+--------++ LV E/e' lateral:6.2       LVIDs:        1.61 cm  +----------------+---------++ +--------------+--------++ LV e' medial:   5.87 cm/s LV PW:        1.40 cm  +----------------+---------++ +--------------+--------++ LV E/e' medial: 8.8       LV IVS:       1.46 cm  +----------------+---------++ +--------------+--------++ LVOT diam:    2.00 cm  +--------------+--------++ LV SV:        12 ml    +--------------+--------++ LV SV Index:  5.22     +--------------+--------++ LVOT Area:     3.14 cm +--------------+--------++                        +--------------+--------++  +---------------+----------++ RIGHT VENTRICLE           +---------------+----------++ RV Basal diam: 3.43 cm    +---------------+----------++ RV S prime:    12.30 cm/s +---------------+----------++ TAPSE (M-mode):3.6 cm     +---------------+----------++  +---------------+-------++-----------++ LEFT ATRIUM           Index       +---------------+-------++-----------++ LA diam:       2.80 cm1.31 cm/m  +---------------+-------++-----------++ LA Vol (A2C):  38.8 ml18.13 ml/m +---------------+-------++-----------++ LA Vol (A4C):  50.5 ml23.60 ml/m +---------------+-------++-----------++ LA Biplane Vol:44.9 ml20.98 ml/m +---------------+-------++-----------++ +------------+---------++-----------++ RIGHT ATRIUM         Index       +------------+---------++-----------++ RA Area:    20.40 cm            +------------+---------++-----------++ RA Volume:  53.70 ml 25.10 ml/m +------------+---------++-----------++  +------------------+-----------++ +---------------+--------++ AORTIC VALVE                  PULMONIC VALVE          +------------------+-----------++ +---------------+--------++ AV Area (Vmax):   2.35 cm    PV Vmax:  0.48 m/s +------------------+-----------++ +---------------+--------++ AV Area (Vmean):  2.69 cm    PV Peak grad:  0.9 mmHg +------------------+-----------++ +---------------+--------++ AV Area (VTI):    2.70 cm    RVOT Peak grad:3 mmHg   +------------------+-----------++ +---------------+--------++ AV Vmax:          111.50 cm/s +------------------+-----------++ AV Vmean:         79.550 cm/s +------------------+-----------++ AV VTI:           0.216 m     +------------------+-----------++ AV Peak Grad:     5.0 mmHg     +------------------+-----------++ AV Mean Grad:     3.0 mmHg    +------------------+-----------++ LVOT Vmax:        83.50 cm/s  +------------------+-----------++ LVOT Vmean:       68.000 cm/s +------------------+-----------++ LVOT VTI:         0.185 m     +------------------+-----------++ LVOT/AV VTI ratio:0.86        +------------------+-----------++   +-------------+-------++ AORTA                +-------------+-------++ Ao Root diam:2.60 cm +-------------+-------++  +--------------+----------++ MITRAL VALVE             +--------------+-------+ +--------------+----------++ SHUNTS                MV Area (PHT):3.06 cm   +--------------+-------+ +--------------+----------++ Systemic VTI: 0.18 m  MV PHT:       71.92 msec +--------------+-------+ +--------------+----------++ Systemic Diam:2.00 cm MV Decel Time:248 msec   +--------------+-------+ +--------------+----------++ +--------------+----------++ MV E velocity:51.40 cm/s +--------------+----------++ MV A velocity:74.20 cm/s +--------------+----------++ MV E/A ratio: 0.69       +--------------+----------++    Nelva Bush MD Electronically signed by Nelva Bush MD Signature Date/Time: 11/27/2018/4:29:45 PM      Final    Assessment  The primary encounter diagnosis was Primary osteoarthritis of left knee. Diagnoses of Chronic pain syndrome and Chronic use of opiate for therapeutic purpose were also pertinent to this visit.  Plan of Care  Ms. Lavaya LILLYANNA GLANDON Romero a current medication list which includes the following long-term medication(s): levothyroxine, lisinopril, montelukast, proair hfa, sumatriptan, and verapamil.  1. Chronic pain syndrome - HYDROcodone-acetaminophen (NORCO) 7.5-325 MG tablet; Take 1 tablet by mouth every 6 (six) hours as needed for severe pain. Must last 30 days.  Dispense: 105 tablet; Refill: 0 -  HYDROcodone-acetaminophen (NORCO) 7.5-325 MG tablet; Take 1 tablet by mouth every 6 (six) hours as needed for severe pain. Must last 30 days.  Dispense: 105 tablet; Refill: 0 - HYDROcodone-acetaminophen (NORCO) 7.5-325 MG tablet; Take 1 tablet by mouth every 6 (six) hours as needed for severe pain. Must last 30 days.  Dispense: 105 tablet; Refill: 0 - ToxASSURE Select 13 (MW), Urine  2. Primary osteoarthritis of left knee - KNEE INJECTION; Future  3. Chronic use of opiate for therapeutic purpose - ToxASSURE Select 13 (MW), Urine   Pharmacotherapy (Medications Ordered): Meds ordered this encounter  Medications  . HYDROcodone-acetaminophen (NORCO) 7.5-325 MG tablet    Sig: Take 1 tablet by mouth every 6 (six) hours as needed for severe pain. Must last 30 days.    Dispense:  105 tablet    Refill:  0    [place] STOP ACT - Not applicable. Fill one day early if pharmacy is closed on scheduled refill date.  [name] HYDROcodone-acetaminophen (NORCO) 7.5-325 MG tablet    Sig: Take 1 tablet by mouth every 6 (six) hours as needed for severe pain. Must last 30 days.  Dispense:  105 tablet    Refill:  0    [place] STOP ACT - Not applicable. Fill one day early if pharmacy is closed on scheduled refill date.  [name] HYDROcodone-acetaminophen (NORCO) 7.5-325 MG tablet    Sig: Take 1 tablet by mouth every 6 (six) hours as needed for severe pain. Must last 30 days.    Dispense:  105 tablet    Refill:  0    [place] STOP ACT - Not applicable. Fill one day early if pharmacy is closed on scheduled refill date.   Orders:  Orders Placed This Encounter  Procedures  . KNEE INJECTION    Hyalgan knee injection. Please order Hyalgan.    Standing Status:   Future    Standing Expiration Date:   09/12/2019    Scheduling Instructions:     Procedure: Intra-articular Hyalgan Knee injection            Side: Left     Sedation: None     Timeframe: in two (2) weeks    Order Specific Question:   Where will this procedure be performed?     Answer:   [facility]  . ToxASSURE Select 13 (MW), Urine    Volume: 30 ml(s). Minimum 3 ml of urine is needed. Document temperature of fresh sample. Indications: Long term (current) use of opiate analgesic (K81.594)   Follow-up plan:   Return in about 2 weeks (around 08/26/2019) for L Hyalgan #4 (get UDS).     Status post left genicular RFA 04/16/2018, status post right genicular RFA 03/12/2018.  Provided her with benefit.  Status post left knee intra-articular steroid injection #3 on 11/21/2018.   hyalgan left knee #1 on 04/17/19, #2 05/22/19, #3 07/03/2019       Recent Visits Date Type Provider Dept  07/03/19 Procedure visit Gillis Santa, MD [facility]  05/22/19 Procedure visit Gillis Santa, MD [facility]  05/14/19 Office Visit Gillis Santa, MD [facility]  Showing recent visits within past 90 days and meeting all other requirements   Today's Visits Date Type Provider Dept  08/12/19 Telemedicine Gillis Santa, MD [facility]  Showing today's visits and meeting all other requirements   Future Appointments No visits were found meeting these conditions.  Showing future appointments within next 90 days and meeting all other requirements   I discussed the assessment and treatment plan with the patient. The patient was provided an opportunity to ask questions and all were answered. The patient agreed with the plan and demonstrated an understanding of the instructions.  Patient advised to call back or seek an in-person evaluation if the symptoms or condition worsens.  Duration of encounter:25 minutes.  Note by: Gillis Santa, MD Date: 08/12/2019; Time: 1:26 PM

## 2019-08-26 ENCOUNTER — Ambulatory Visit (INDEPENDENT_AMBULATORY_CARE_PROVIDER_SITE_OTHER): Payer: Medicare Other | Admitting: Pharmacist

## 2019-08-26 ENCOUNTER — Other Ambulatory Visit: Payer: Self-pay | Admitting: Family Medicine

## 2019-08-26 DIAGNOSIS — I10 Essential (primary) hypertension: Secondary | ICD-10-CM | POA: Diagnosis not present

## 2019-08-26 DIAGNOSIS — J441 Chronic obstructive pulmonary disease with (acute) exacerbation: Secondary | ICD-10-CM | POA: Diagnosis not present

## 2019-08-26 MED ORDER — PROAIR HFA 108 (90 BASE) MCG/ACT IN AERS: INHALATION_SPRAY | RESPIRATORY_TRACT | 1 refills | Status: DC

## 2019-08-26 NOTE — Progress Notes (Signed)
Refill for rescue inhaler sent to pharmacy on file.

## 2019-08-26 NOTE — Patient Instructions (Signed)
Thank you allowing the Chronic Care Management Team to be a part of your care! It was a pleasure speaking with you today!     CCM (Chronic Care Management) Team    Noreene Larsson RN, MSN, CCM Nurse Care Coordinator  [phone]   Harlow Asa PharmD  Clinical Pharmacist  [phone]   [name] LCSW Clinical Social Worker [phone]  Visit Information  Goals Addressed            This Visit's Progress   . PharmD - "I do not have any of my inhaler"       [place] (see longtitudinal plan of care for additional care plan information)   Current Barriers:  . Chronic Disease Management support, education, and care coordination needs related to HTN, COPD, and hypothyroidism . Financial Barriers  Pharmacist Clinical Goal(s):  [name] Over the next 30 days, patient will work with CM Pharmacist to address needs related to medication adherence and medication assistance  Interventions: . Perform chart review . Encourage patient to schedule follow up with PCP. Note patient due to follow up and lab work. ? Encourage patient to also talk to provider regarding her concerns about changes memory. . Comprehensive medication review performed; medication list updated in electronic medical record ? Patient denies having picked up sample of Breztri inhaler from PCP office.  ? Denies currently having albuterol rescue inhaler. Note current albuterol inhaler Rx expired. ? Reports not currently taking verapamil CR  ? Note on 3/22 Dr. Parks Ranger started patient on Verapamil CR 120mg  daily, low dose for both HTN and migraine prevention ? Reports started taking, but experienced dizziness and discontinued . Collaborate with PCP regarding patient's inhalers ? Samples of Breztri not currently available in office ? Request new Rx for albuterol inhaler to patient's pharmacy. Provider sends Rx today. Myles Rosenthal on importance of blood pressure control and monitoring ? Reports taking  lisinopril 20 mg once daily ? As noted above, not currently taking verapamil CR 120 mg daily ? Denies checking home BP recently. Reports needing new BP monitor.  ? States will obtain new upper arm BP monitor ? Encourage patient to restart checking BP, keep log and bring log with her to upcoming PCP appointment. Advise patient to call office sooner for BP results outside of established parameters ? Counsel on BP monitoring technique. Myles Rosenthal on importance of medication adherence. Reports using weekly pillbox to organize medications. . Follow up with patient regarding medication assistance ? Reports believes husband discarded the patient assistance application mailed by [facility]. Requests new copy of application. . Will collaborate with [facility] to request that she mail patient assistance application for Breztri through [employer] to patient again and assist patient with applying for patient assistance for Proventil through [employer]  Patient Self Care Activities:  . Unable to self administer medications as prescribed, due to cost of medication  Please see past updates related to this goal by clicking on the "Past Updates" button in the selected goal         Patient verbalizes understanding of instructions provided today.   Telephone follow up appointment with care management team member scheduled for: 6/2 at 26 am  Harlow Asa, PharmD, [place] [phone]

## 2019-08-26 NOTE — Chronic Care Management (AMB) (Signed)
Chronic Care Management   Follow Up Note   08/26/2019 Name: ZAKERIA KARTCHNER MRN: [id number] DOB: 14-Feb-1951  Referred by: Mikey College, NP (Inactive) Reason for referral : Chronic Care Management (Patient Phone Call)   LORNA NEVIN is a 69 y.o. year old female who is a primary care patient of Mikey College, NP (Inactive). The CCM team was consulted for assistance with chronic disease management and care coordination needs.    I reached out to [name] by phone today.   Review of patient status, including review of consultants reports, relevant laboratory and other test results, and collaboration with appropriate care team members and the patient's provider was performed as part of comprehensive patient evaluation and provision of chronic care management services.    Outpatient Encounter Medications as of 08/26/2019  Medication Sig  . esomeprazole (NEXIUM) 40 MG capsule Take 1 capsule (40 mg total) by mouth daily.  [name] HYDROcodone-acetaminophen (NORCO) 7.5-325 MG tablet Take 1 tablet by mouth every 6 (six) hours as needed for severe pain. Must last 30 days.  [name] levothyroxine (SYNTHROID) 125 MCG tablet Take two tablets by mouth daily before breakfast with water.  [name] lisinopril (ZESTRIL) 20 MG tablet Take 1 tablet (20 mg total) by mouth daily.  . ondansetron (ZOFRAN ODT) 4 MG disintegrating tablet Take 1 tablet (4 mg total) by mouth every 8 (eight) hours as needed for nausea or vomiting.  . polyethylene glycol powder (GLYCOLAX/MIRALAX) 17 GM/SCOOP powder Take 17 g by mouth daily as needed for mild constipation or moderate constipation.  . SUMAtriptan (IMITREX) 50 MG tablet Take 1 tablet (50 mg total) by mouth once as needed for up to 1 dose for migraine. May repeat one dose in 2 hours if headache persists, for max dose 24 hours  . baclofen (LIORESAL) 10 MG tablet TAKE 1 TABLET BY MOUTH THREE TIMES A DAY AS NEEDED FOR MUSCLE SPASMS (Patient not taking: Reported on  08/26/2019)  . Budeson-Glycopyrrol-Formoterol (BREZTRI AEROSPHERE) 160-9-4.8 MCG/ACT AERO Inhale 2 puffs into the lungs 2 (two) times daily. (Patient not taking: Reported on 08/26/2019)  . docusate sodium (COLACE) 100 MG capsule Take 100 mg by mouth 2 (two) times daily as needed.  [name] ON 09/21/2019] HYDROcodone-acetaminophen (NORCO) 7.5-325 MG tablet Take 1 tablet by mouth every 6 (six) hours as needed for severe pain. Must last 30 days.  [name] ON 10/21/2019] HYDROcodone-acetaminophen (NORCO) 7.5-325 MG tablet Take 1 tablet by mouth every 6 (six) hours as needed for severe pain. Must last 30 days.  . montelukast (SINGULAIR) 10 MG tablet Take 1 tablet (10 mg total) by mouth at bedtime. (Patient not taking: Reported on 08/26/2019)  . verapamil (CALAN-SR) 120 MG CR tablet Take 1 tablet (120 mg total) by mouth at bedtime. (Patient not taking: Reported on 08/26/2019)  . [DISCONTINUED] PROAIR HFA 108 (90 Base) MCG/ACT inhaler INHALE 2 PUFFS INTO THE LUNGS EVERY 4 HOURS AS NEEDED FOR WHEEZING OR SHORTNESS OF BREATH (COUGH (Patient not taking: Reported on 08/26/2019)   No facility-administered encounter medications on file as of 08/26/2019.    Goals Addressed            This Visit's Progress   . PharmD - "I do not have any of my inhaler"       [place] (see longtitudinal plan of care for additional care plan information)   Current Barriers:  . Chronic Disease Management support, education, and care coordination needs related to HTN, COPD, and hypothyroidism .  Financial Barriers  Pharmacist Clinical Goal(s):  [name] Over the next 30 days, patient will work with CM Pharmacist to address needs related to medication adherence and medication assistance  Interventions: . Perform chart review . Encourage patient to schedule follow up with PCP. Note patient due to follow up and lab work. ? Encourage patient to also talk to provider regarding her concerns about changes memory. . Comprehensive  medication review performed; medication list updated in electronic medical record ? Patient denies having picked up sample of Breztri inhaler from PCP office.  ? Denies currently having albuterol rescue inhaler. Note current albuterol inhaler Rx expired. ? Reports not currently taking verapamil CR  ? Note on 3/22 Dr. Parks Ranger started patient on Verapamil CR 120mg  daily, low dose for both HTN and migraine prevention ? Reports started taking, but experienced dizziness and discontinued . Collaborate with PCP regarding patient's inhalers ? Samples of Breztri not currently available in office ? Request new Rx for albuterol inhaler to patient's pharmacy. Provider sends Rx today. Myles Rosenthal on importance of blood pressure control and monitoring ? Reports taking lisinopril 20 mg once daily ? As noted above, not currently taking verapamil CR 120 mg daily ? Denies checking home BP recently. Reports needing new BP monitor.  ? States will obtain new upper arm BP monitor ? Encourage patient to restart checking BP, keep log and bring log with her to upcoming PCP appointment. Advise patient to call office sooner for BP results outside of established parameters ? Counsel on BP monitoring technique. Myles Rosenthal on importance of medication adherence. Reports using weekly pillbox to organize medications. . Follow up with patient regarding medication assistance ? Reports believes husband discarded the patient assistance application mailed by [facility]. Requests new copy of application. . Will collaborate with [facility] to request that she mail patient assistance application for Breztri through [employer] to patient again and assist patient with applying for patient assistance for Proventil through [employer]  Patient Self Care Activities:  . Unable to self administer medications as prescribed, due to cost of medication  Please see past updates related to this goal by clicking on the "Past Updates" button in the  selected goal         Plan  Telephone follow up appointment with care management team member scheduled for: 6/2 at 10 am  Harlow Asa, PharmD, [place] [phone]

## 2019-08-27 ENCOUNTER — Other Ambulatory Visit: Payer: Self-pay | Admitting: Pharmacy Technician

## 2019-08-27 NOTE — Patient Outreach (Signed)
Pamela Romero [facility]) Care Management  08/27/2019  Pamela Romero Jan 13, 1951 [id number]                                       Medication Assistance Referral  Referral From: [facility] Embedded RPh Dorthula Perfect   Medication/Company: Proventil HFA / [employer] Patient application portion:  Education officer, museum portion: Magazine features editor to Cyndia Skeeters, FNP Provider address/fax verified via: Office website  Medication/Company: [name] / [place] Patient application portion:  Mailed for the 3rd time Provider application portion:  N/A already signed previously to Cyndia Skeeters, [place] Provider address/fax verified via: Office website    Follow up:  Will follow up with patient in 5-7 business days to confirm application(s) have been received.  Kinzi Frediani P. Jenaveve Fenstermaker, [place]  [phone]

## 2019-08-28 ENCOUNTER — Other Ambulatory Visit: Payer: Self-pay

## 2019-08-28 ENCOUNTER — Encounter: Payer: Self-pay | Admitting: Student in an Organized Health Care Education/Training Program

## 2019-08-28 ENCOUNTER — Ambulatory Visit
Payer: Medicare Other | Attending: Student in an Organized Health Care Education/Training Program | Admitting: Student in an Organized Health Care Education/Training Program

## 2019-08-28 VITALS — BP 125/99 | HR 98 | Temp 98.1°F | Resp 16 | Ht 68.5 in | Wt 200.0 lb

## 2019-08-28 DIAGNOSIS — M1712 Unilateral primary osteoarthritis, left knee: Secondary | ICD-10-CM | POA: Diagnosis present

## 2019-08-28 MED ORDER — LIDOCAINE HCL (PF) 1 % IJ SOLN
5.0000 mL | Freq: Once | INTRAMUSCULAR | Status: AC
  Administered 2019-08-28: 5 mL
  Filled 2019-08-28: qty 5

## 2019-08-28 MED ORDER — SODIUM HYALURONATE (VISCOSUP) 20 MG/2ML IX SOSY: 2.0000 mL | PREFILLED_SYRINGE | Freq: Once | INTRA_ARTICULAR | Status: AC

## 2019-08-28 NOTE — Progress Notes (Signed)
Patient's Name: Pamela Romero  MRN: [id number]  Referring Provider: No ref. provider found  DOB: 1951-03-01  PCP: Mikey College, NP (Inactive)  DOS: 08/28/2019  Note by: Gillis Santa, MD  Service setting: Ambulatory outpatient  Specialty: Interventional Pain Management  Patient type: Established  Location: [facility] (AMB) Pain Management Facility  Visit type: Interventional Procedure   Primary Reason for Visit: Interventional Pain Management Treatment. CC: Knee Pain (left)  Procedure:          Anesthesia, Analgesia, Anxiolysis:  Type: Therapeutic Intra-Articular Hyalgan Knee Injection #4  Region: Medial infrapatellar Knee Region Level: Knee Joint Laterality: Left knee  Type: Local Anesthesia Indication(s): Analgesia         Local Anesthetic: Lidocaine 1-2% Route: Infiltration ([place]/IM) IV Access: Declined Sedation: Declined   Position: Sitting   Indications: 1. Primary osteoarthritis of left knee    Pain Score: Pre-procedure: 6 /10 Post-procedure: 3/10   Pre-op Assessment:  Pamela Romero is a 69 y.o. (year old), female patient, seen today for interventional treatment. She  has a past surgical history that includes Thyroidectomy (2005); Cardiac catheterization (2006); Total abdominal hysterectomy; Breast surgery (Right, March 2014); and Breast cyst aspiration (Right). Pamela Romero has a current medication list which includes the following prescription(s): docusate sodium, esomeprazole, hydrocodone-acetaminophen, [START ON 09/21/2019] hydrocodone-acetaminophen, [START ON 10/21/2019] hydrocodone-acetaminophen, levothyroxine, lisinopril, polyethylene glycol powder, proair hfa, baclofen, breztri aerosphere, montelukast, ondansetron, sumatriptan, and verapamil. Her primarily concern today is the Knee Pain (left)  Initial Vital Signs:  Pulse/HCG Rate: 98  Temp: 98.1 F (36.7 C) Resp: 16 BP: (!) 125/99 SpO2: 97 %  BMI: Estimated body mass index is 29.97 kg/m as calculated from the  following:   Height as of this encounter: 5' 8.5" (1.74 m).   Weight as of this encounter: 200 lb (90.7 kg).  Risk Assessment: Allergies: Reviewed. She has No Known Allergies.  Allergy Precautions: None required Coagulopathies: Reviewed. None identified.  Blood-thinner therapy: None at this time Active Infection(s): Reviewed. None identified. Pamela Romero is afebrile  Site Confirmation: Pamela Romero was asked to confirm the procedure and laterality before marking the site Procedure checklist: Completed Consent: Before the procedure and under the influence of no sedative(s), amnesic(s), or anxiolytics, the patient was informed of the treatment options, risks and possible complications. To fulfill our ethical and legal obligations, as recommended by the American Medical Association's Code of Ethics, I have informed the patient of my clinical impression; the nature and purpose of the treatment or procedure; the risks, benefits, and possible complications of the intervention; the alternatives, including doing nothing; the risk(s) and benefit(s) of the alternative treatment(s) or procedure(s); and the risk(s) and benefit(s) of doing nothing. The patient was provided information about the general risks and possible complications associated with the procedure. These may include, but are not limited to: failure to achieve desired goals, infection, bleeding, organ or nerve damage, allergic reactions, paralysis, and death. In addition, the patient was informed of those risks and complications associated to the procedure, such as failure to decrease pain; infection; bleeding; organ or nerve damage with subsequent damage to sensory, motor, and/or autonomic systems, resulting in permanent pain, numbness, and/or weakness of one or several areas of the body; allergic reactions; (i.e.: anaphylactic reaction); and/or death. Furthermore, the patient was informed of those risks and complications associated with the  medications. These include, but are not limited to: allergic reactions (i.e.: anaphylactic or anaphylactoid reaction(s)); adrenal axis suppression; blood sugar elevation that in diabetics may result in ketoacidosis or  comma; water retention that in patients with history of congestive heart failure may result in shortness of breath, pulmonary edema, and decompensation with resultant heart failure; weight gain; swelling or edema; medication-induced neural toxicity; particulate matter embolism and blood vessel occlusion with resultant organ, and/or nervous system infarction; and/or aseptic necrosis of one or more joints. Finally, the patient was informed that Medicine is not an exact science; therefore, there is also the possibility of unforeseen or unpredictable risks and/or possible complications that may result in a catastrophic outcome. The patient indicated having understood very clearly. We have given the patient no guarantees and we have made no promises. Enough time was given to the patient to ask questions, all of which were answered to the patient's satisfaction. Pamela Romero has indicated that she wanted to continue with the procedure. Attestation: I, the ordering provider, attest that I have discussed with the patient the benefits, risks, side-effects, alternatives, likelihood of achieving goals, and potential problems during recovery for the procedure that I have provided informed consent. Date  Time: 08/28/2019  9:00 AM  Pre-Procedure Preparation:  Monitoring: As per clinic protocol. Respiration, ETCO2, SpO2, BP, heart rate and rhythm monitor placed and checked for adequate function Safety Precautions: Patient was assessed for positional comfort and pressure points before starting the procedure. Time-out: I initiated and conducted the "Time-out" before starting the procedure, as per protocol. The patient was asked to participate by confirming the accuracy of the "Time Out" information.  Verification of the correct person, site, and procedure were performed and confirmed by me, the nursing staff, and the patient. "Time-out" conducted as per Joint Commission's Universal Protocol (UP.01.01.01). Time: 0928  Description of Procedure:          Target Area: Knee Joint Approach: Just above the Medial tibial plateau, lateral to the infrapatellar tendon. Area Prepped: Entire knee area, from the mid-thigh to the mid-shin. Prepping solution: DuraPrep (Iodine Povacrylex [0.7% available iodine] and Isopropyl Alcohol, 74% w/w) Safety Precautions: Aspiration looking for blood return was conducted prior to all injections. At no point did we inject any substances, as a needle was being advanced. No attempts were made at seeking any paresthesias. Safe injection practices and needle disposal techniques used. Medications properly checked for expiration dates. SDV (single dose vial) medications used. Description of the Procedure: Protocol guidelines were followed. The patient was placed in position over the fluoroscopy table. The target area was identified and the area prepped in the usual manner. Skin & deeper tissues infiltrated with local anesthetic. Appropriate amount of time allowed to pass for local anesthetics to take effect. The procedure needles were then advanced to the target area. Proper needle placement secured. Negative aspiration confirmed. Solution injected in intermittent fashion, asking for systemic symptoms every 0.5cc of injectate. The needles were then removed and the area cleansed, making sure to leave some of the prepping solution back to take advantage of its long term bactericidal properties. Vitals:   08/28/19 0909  BP: (!) 125/99  Pulse: 98  Resp: 16  Temp: 98.1 F (36.7 C)  TempSrc: Oral  SpO2: 97%  Weight: 200 lb (90.7 kg)  Height: 5' 8.5" (1.74 m)    Start Time: 0929 hrs. End Time: 0930 hrs. Materials:  Needle(s) Type: Regular needle Gauge: 25G Length:  1.5-in Medication(s): Please see orders for medications and dosing details. 2 cc of Hyalgan injected Imaging Guidance:          Type of Imaging Technique: None used Indication(s): N/A Exposure Time: No patient exposure  Contrast: None used. Fluoroscopic Guidance: N/A Ultrasound Guidance: N/A Interpretation: N/A  Antibiotic Prophylaxis:   Anti-infectives (From admission, onward)   None     Indication(s): None identified  Post-operative Assessment:  Post-procedure Vital Signs:  Pulse/HCG Rate: 98  Temp: 98.1 F (36.7 C) Resp: 16 BP: (!) 125/99 SpO2: 97 %  EBL: None  Complications: No immediate post-treatment complications observed by team, or reported by patient.  Note: The patient tolerated the entire procedure well. A repeat set of vitals were taken after the procedure and the patient was kept under observation following institutional policy, for this type of procedure. Post-procedural neurological assessment was performed, showing return to baseline, prior to discharge. The patient was provided with post-procedure discharge instructions, including a section on how to identify potential problems. Should any problems arise concerning this procedure, the patient was given instructions to immediately contact us, at any time, without hesitation. In any case, we plan to contact the patient by telephone for a follow-up status report regarding this interventional procedure.  Comments:  No additional relevant information.  Plan of Care  Orders:  Orders Placed This Encounter  Procedures  . KNEE INJECTION    Hyalgan knee injection. Please order Hyalgan.    Standing Status:   Future    Standing Expiration Date:   09/28/2019    Scheduling Instructions:     Procedure: Intra-articular Hyalgan Knee injection            Side: L     Sedation: None     Timeframe: in 4    Order Specific Question:   Where will this procedure be performed?    Answer:   [facility]   Medications  ordered for procedure: Meds ordered this encounter  Medications  . lidocaine (PF) (XYLOCAINE) 1 % injection 5 mL  . Sodium Hyaluronate SOSY 2 mL   Medications administered: We administered lidocaine (PF) and Sodium Hyaluronate.  See the medical record for exact dosing, route, and time of administration.  Follow-up plan:   Return in about 4 weeks (around 09/25/2019) for hyalgan #5.      Status post left genicular RFA 04/16/2018, status post right genicular RFA 03/12/2018.  Provided her with benefit.  Status post left knee intra-articular steroid injection #3 on 11/21/2018.   hyalgan left knee #1 on 04/17/19, #2 05/22/19, #3 07/03/2019, #4 08/28/19      Recent Visits Date Type Provider Dept  08/12/19 Telemedicine Gillis Santa, MD [facility]  07/03/19 Procedure visit Gillis Santa, MD [facility]  Showing recent visits within past 90 days and meeting all other requirements   Today's Visits Date Type Provider Dept  08/28/19 Procedure visit Gillis Santa, MD [facility]  Showing today's visits and meeting all other requirements   Future Appointments Date Type Provider Dept  09/25/19 Appointment Gillis Santa, MD [facility]  11/07/19 Appointment Gillis Santa, MD [facility]  Showing future appointments within next 90 days and meeting all other requirements   Disposition: Discharge home  Discharge Date & Time: 08/28/2019; 0935 hrs.   Primary Care Physician: Mikey College, NP (Inactive) Location: [facility] Outpatient [facility] Facility Note by: Gillis Santa, MD Date: 08/28/2019; Time: 9:56 AM  Disclaimer:  Medicine is not an exact science. The only guarantee in medicine is that nothing is guaranteed. It is important to note that the decision to proceed with this intervention was based on the information collected from the patient. The Data and conclusions were drawn from  the patient's questionnaire, the interview, and the physical  examination. Because the information was provided in large part by the patient, it cannot be guaranteed that it has not been purposely or unconsciously manipulated. Every effort has been made to obtain as much relevant data as possible for this evaluation. It is important to note that the conclusions that lead to this procedure are derived in large part from the available data. Always take into account that the treatment will also be dependent on availability of resources and existing treatment guidelines, considered by other Pain Management Practitioners as being common knowledge and practice, at the time of the intervention. For Medico-Legal purposes, it is also important to point out that variation in procedural techniques and pharmacological choices are the acceptable norm. The indications, contraindications, technique, and results of the above procedure should only be interpreted and judged by a Board-Certified Interventional Pain Specialist with extensive familiarity and expertise in the same exact procedure and technique.

## 2019-08-28 NOTE — Patient Instructions (Signed)

## 2019-08-29 ENCOUNTER — Telehealth: Payer: Self-pay

## 2019-08-29 NOTE — Telephone Encounter (Signed)
Post procedure phone call.  Patient states she is doing well.  

## 2019-08-30 ENCOUNTER — Ambulatory Visit (INDEPENDENT_AMBULATORY_CARE_PROVIDER_SITE_OTHER): Payer: Medicare Other | Admitting: Family Medicine

## 2019-08-30 ENCOUNTER — Encounter: Payer: Self-pay | Admitting: Family Medicine

## 2019-08-30 ENCOUNTER — Other Ambulatory Visit: Payer: Self-pay

## 2019-08-30 VITALS — BP 126/84 | HR 90 | Temp 97.1°F | Ht 68.0 in | Wt 241.0 lb

## 2019-08-30 DIAGNOSIS — R413 Other amnesia: Secondary | ICD-10-CM

## 2019-08-30 DIAGNOSIS — E785 Hyperlipidemia, unspecified: Secondary | ICD-10-CM | POA: Diagnosis not present

## 2019-08-30 DIAGNOSIS — I10 Essential (primary) hypertension: Secondary | ICD-10-CM | POA: Diagnosis not present

## 2019-08-30 DIAGNOSIS — E890 Postprocedural hypothyroidism: Secondary | ICD-10-CM | POA: Diagnosis not present

## 2019-08-30 DIAGNOSIS — R5382 Chronic fatigue, unspecified: Secondary | ICD-10-CM

## 2019-08-30 DIAGNOSIS — R5383 Other fatigue: Secondary | ICD-10-CM | POA: Insufficient documentation

## 2019-08-30 NOTE — Patient Instructions (Signed)
As we discussed, I have put in orders for lab work.  Have that completed today.  I have put in a referral to Neurology for further evaluation of your concerns of recent memory loss.  If you have not heard from the Neurology office or our referral coordinator within 1 week, to please contact our office and I will reach out to the referral coordinator.  We will plan to see you back in 1 month for follow up on memory loss and fatigue  You will receive a survey after today's visit either digitally by e-mail or paper by [place] mail. Your experiences and feedback matter to [place].  Please respond so we know how we are doing as we provide care for you.  Call us with any questions/concerns/needs.  It is my goal to be available to you for your health concerns.  Thanks for choosing me to be a partner in your healthcare needs!  Harlin Rain, FNP-C Family Nurse Practitioner [facility] Phone: [phone]

## 2019-08-30 NOTE — Progress Notes (Signed)
Subjective:    Patient ID: Pamela Romero Romero, female    DOB: 1951-03-02, 69 y.o.   MRN: [id number]  Pamela Romero Romero is a 69 y.o. female presenting on 08/30/2019 for Memory Loss (x 3-4 mths. Pt state shes been more forgetful. She complains of losing her train of thought more frequent over the past 3 mths.)   HPI  Pamela Romero Romero presents to clinic for evaluation of short term memory loss that reports has been going on 2-4 months, with recently having more friends/family commenting on her memory loss.  States has a hard time recalling details from television shows she just recently watched or remembering the events of something that happened just the night before.  Denies any family history of alzheimer's, dementia or memory loss.  States sometimes she has difficulty with coordination but is unsure if that is related to her knee pain or her balance.  Depression screen [facility] 2/9 08/28/2019 07/01/2019 06/13/2019  Decreased Interest 0 0 0  Down, Depressed, Hopeless 0 0 0  PHQ - 2 Score 0 0 0  Altered sleeping - - -  Tired, decreased energy - - -  Change in appetite - - -  Feeling bad or failure about yourself  - - -  Trouble concentrating - - -  Moving slowly or fidgety/restless - - -  Suicidal thoughts - - -  PHQ-9 Score - - -  Difficult doing work/chores - - -    Social History   Tobacco Use  . Smoking status: Former Smoker    Packs/day: 0.50    Years: 50.00    Pack years: 25.00    Quit date: 05/11/2018    Years since quitting: 1.3  . Smokeless tobacco: Former Network engineer Use Topics  . Alcohol use: No  . Drug use: No    Review of Systems  Constitutional: Negative.   HENT: Negative.   Eyes: Negative.   Respiratory: Negative.   Cardiovascular: Negative.   Gastrointestinal: Negative.   Endocrine: Negative.   Genitourinary: Negative.   Musculoskeletal: Negative.   Skin: Negative.   Allergic/Immunologic: Negative.   Neurological: Negative.        Memory loss  Hematological:  Negative.   Psychiatric/Behavioral: Negative.    Per HPI unless specifically indicated above     Objective:    BP 126/84 (BP Location: Left Arm, Patient Position: Sitting, Cuff Size: Large)   Pulse 90   Temp (!) 97.1 F (36.2 C) (Temporal)   Ht '5\' 8"'$  (1.727 m)   Wt 241 lb (109.3 kg)   SpO2 99%   BMI 36.64 kg/m   Wt Readings from Last 3 Encounters:  08/30/19 241 lb (109.3 kg)  08/28/19 200 lb (90.7 kg)  07/03/19 203 lb (92.1 kg)    Physical Exam Vitals reviewed.  Constitutional:      General: She is not in acute distress.    Appearance: Normal appearance. She is well-developed and well-groomed. She is obese. She is not ill-appearing or toxic-appearing.  HENT:     Head: Normocephalic.  Eyes:     General: Lids are normal. Vision grossly intact.        Right eye: No discharge.        Left eye: No discharge.     Extraocular Movements: Extraocular movements intact.     Conjunctiva/sclera: Conjunctivae normal.     Pupils: Pupils are equal, round, and reactive to light.  Cardiovascular:     Rate and Rhythm: Normal rate and regular rhythm.  Pulses: Normal pulses.     Heart sounds: Normal heart sounds. No murmur. No friction rub. No gallop.   Pulmonary:     Effort: Pulmonary effort is normal. No respiratory distress.     Breath sounds: Normal breath sounds.  Musculoskeletal:     Right lower leg: No edema.     Left lower leg: No edema.     Comments: 5/5 strength in bilateral upper and lower extremities  Feet:     Right foot:     Skin integrity: Skin integrity normal.     Left foot:     Skin integrity: Skin integrity normal.  Skin:    General: Skin is warm and dry.     Capillary Refill: Capillary refill takes less than 2 seconds.  Neurological:     General: No focal deficit present.     Mental Status: She is alert and oriented to person, place, and time.     Cranial Nerves: Cranial nerves are intact. No cranial nerve deficit.     Sensory: Sensation is intact. No  sensory deficit.     Motor: Motor function is intact. No weakness.     Coordination: Coordination is intact. Coordination normal.     Gait: Gait is intact. Gait normal.     Deep Tendon Reflexes: Reflexes are normal and symmetric. Reflexes normal.  Psychiatric:        Attention and Perception: Attention and perception normal.        Mood and Affect: Mood and affect normal.        Speech: Speech normal.        Behavior: Behavior normal. Behavior is cooperative.        Thought Content: Thought content normal.        Cognition and Memory: Cognition normal. She exhibits impaired recent memory.        Judgment: Judgment normal.    Results for orders placed or performed in visit on 11/06/18  TSH  Result Value Ref Range   TSH 71.30 (H) 0.40 - 4.50 mIU/L  T4, free  Result Value Ref Range   Free T4 0.2 (L) 0.8 - 1.8 ng/dL  CBC with Differential/Platelet  Result Value Ref Range   WBC 5.9 3.8 - 10.8 Thousand/uL   RBC 3.84 3.80 - 5.10 Million/uL   Hemoglobin 12.9 11.7 - 15.5 g/dL   HCT 38.2 35.0 - 45.0 %   MCV 99.5 80.0 - 100.0 fL   MCH 33.6 (H) 27.0 - 33.0 pg   MCHC 33.8 32.0 - 36.0 g/dL   RDW 13.9 11.0 - 15.0 %   Platelets 147 140 - 400 Thousand/uL   MPV 10.5 7.5 - 12.5 fL   Neutro Abs 4,160 1,500 - 7,800 cells/uL   Lymphs Abs 1,345 850 - 3,900 cells/uL   Absolute Monocytes 319 200 - 950 cells/uL   Eosinophils Absolute 59 15 - 500 cells/uL   Basophils Absolute 18 0 - 200 cells/uL   Neutrophils Relative % 70.5 %   Total Lymphocyte 22.8 %   Monocytes Relative 5.4 %   Eosinophils Relative 1.0 %   Basophils Relative 0.3 %  COMPLETE METABOLIC PANEL WITH GFR  Result Value Ref Range   Glucose, Bld 81 65 - 99 mg/dL   BUN 16 7 - 25 mg/dL   Creat 1.26 (H) 0.50 - 0.99 mg/dL   GFR, Est Non African American 44 (L) > OR = 60 mL/min/1.43m   GFR, Est African American 51 (L) > OR = 60 mL/min/1.758m  BUN/Creatinine Ratio 13 6 - 22 (calc)   Sodium 141 135 - 146 mmol/L   Potassium 3.8 3.5 -  5.3 mmol/L   Chloride 101 98 - 110 mmol/L   CO2 31 20 - 32 mmol/L   Calcium 8.9 8.6 - 10.4 mg/dL   Total Protein 6.7 6.1 - 8.1 g/dL   Albumin 4.0 3.6 - 5.1 g/dL   Globulin 2.7 1.9 - 3.7 g/dL (calc)   AG Ratio 1.5 1.0 - 2.5 (calc)   Total Bilirubin 0.4 0.2 - 1.2 mg/dL   Alkaline phosphatase (APISO) 48 37 - 153 U/L   AST 16 10 - 35 U/L   ALT 6 6 - 29 U/L  Hemoglobin A1c  Result Value Ref Range   Hgb A1c MFr Bld 5.1 <5.7 % of total Hgb   Mean Plasma Glucose 100 (calc)   eAG (mmol/L) 5.5 (calc)      Assessment & Plan:   Problem List Items Addressed This Visit      Cardiovascular and Mediastinum   Essential hypertension - Primary   Relevant Orders   CBC with Differential   COMPLETE METABOLIC PANEL WITH GFR     Endocrine   Hypothyroidism   Relevant Orders   Thyroid Panel With TSH     Other   Hyperlipidemia LDL goal <100   Relevant Orders   Lipid Profile   Memory loss    Reports worsening of memory loss over last 2-3 months.  Friends/family have been mentioning to her more that she is forgetting things.  States can have a telephone call and try to discuss something she watched the night before with difficulty recalling details.  MMSE screening completed.  Discussed may be over medication vs. Early memory loss.  Has history of migraines but has not met with Neurology in the past.  Will refer to Neurology for additional evaluation and treatment recommendations.  Plan: 1. Referral to Neurology      Relevant Orders   Ambulatory referral to Neurology   Fatigue    Patient with hypothyroidism, labs checked 05/2018 and TSH was 50.77, medication changes were made to levothyroxine without repeat labs, as requested 4 weeks later.  Discussed fatigue could be related to thyroid function, but will have full set of labs drawn for evaluation.  Plan: 1. Have labs drawn today 2. Follow up in 4 weeks         No orders of the defined types were placed in this encounter.     Follow  up plan: Return in about 4 weeks (around 09/27/2019) for Fatigue/Memory Loss follow up.   Harlin Rain, [place] [facility] Nurse Practitioner [facility] 08/30/2019, 1:15 PM

## 2019-08-30 NOTE — Assessment & Plan Note (Signed)
Reports worsening of memory loss over last 2-3 months.  Friends/family have been mentioning to her more that she is forgetting things.  States can have a telephone call and try to discuss something she watched the night before with difficulty recalling details.  MMSE screening completed.  Discussed may be over medication vs. Early memory loss.  Has history of migraines but has not met with Neurology in the past.  Will refer to Neurology for additional evaluation and treatment recommendations.  Plan: 1. Referral to Neurology 

## 2019-08-30 NOTE — Assessment & Plan Note (Signed)
Patient with hypothyroidism, labs checked 05/2018 and TSH was 50.77, medication changes were made to levothyroxine without repeat labs, as requested 4 weeks later.  Discussed fatigue could be related to thyroid function, but will have full set of labs drawn for evaluation.  Plan: 1. Have labs drawn today 2. Follow up in 4 weeks

## 2019-08-31 LAB — COMPLETE METABOLIC PANEL WITH GFR
AG Ratio: 1.6 (calc) (ref 1.0–2.5)
ALT: 6 U/L (ref 6–29)
AST: 17 U/L (ref 10–35)
Albumin: 3.6 g/dL (ref 3.6–5.1)
Alkaline phosphatase (APISO): 56 U/L (ref 37–153)
BUN: 14 mg/dL (ref 7–25)
CO2: 29 mmol/L (ref 20–32)
Calcium: 9 mg/dL (ref 8.6–10.4)
Chloride: 105 mmol/L (ref 98–110)
Creat: 0.87 mg/dL (ref 0.50–0.99)
GFR, Est African American: 80 mL/min/{1.73_m2} (ref >=60)
GFR, Est Non African American: 69 mL/min/{1.73_m2} (ref >=60)
Globulin: 2.2 g/dL (calc) (ref 1.9–3.7)
Glucose, Bld: 89 mg/dL (ref 65–99)
Potassium: 4.4 mmol/L (ref 3.5–5.3)
Sodium: 141 mmol/L (ref 135–146)
Total Bilirubin: 0.5 mg/dL (ref 0.2–1.2)
Total Protein: 5.8 g/dL — ABNORMAL LOW (ref 6.1–8.1)

## 2019-08-31 LAB — CBC WITH DIFFERENTIAL/PLATELET
Absolute Monocytes: 310 cells/uL (ref 200–950)
Basophils Absolute: 22 cells/uL (ref 0–200)
Basophils Relative: 0.5 %
Eosinophils Absolute: 52 cells/uL (ref 15–500)
Eosinophils Relative: 1.2 %
HCT: 36.2 % (ref 35.0–45.0)
Hemoglobin: 11.9 g/dL (ref 11.7–15.5)
Lymphs Abs: 976 cells/uL (ref 850–3900)
MCH: 33.1 pg — ABNORMAL HIGH (ref 27.0–33.0)
MCHC: 32.9 g/dL (ref 32.0–36.0)
MCV: 100.6 fL — ABNORMAL HIGH (ref 80.0–100.0)
MPV: 10.8 fL (ref 7.5–12.5)
Monocytes Relative: 7.2 %
Neutro Abs: 2941 cells/uL (ref 1500–7800)
Neutrophils Relative %: 68.4 %
Platelets: 129 10*3/uL — ABNORMAL LOW (ref 140–400)
RBC: 3.6 10*6/uL — ABNORMAL LOW (ref 3.80–5.10)
RDW: 12.4 % (ref 11.0–15.0)
Total Lymphocyte: 22.7 %
WBC: 4.3 10*3/uL (ref 3.8–10.8)

## 2019-08-31 LAB — THYROID PANEL WITH TSH
Free Thyroxine Index: 6.2 — ABNORMAL HIGH (ref 1.4–3.8)
T3 Uptake: 33 % (ref 22–35)
T4, Total: 18.7 ug/dL — ABNORMAL HIGH (ref 5.1–11.9)
TSH: 0.66 mIU/L (ref 0.40–4.50)

## 2019-08-31 LAB — LIPID PANEL
Cholesterol: 186 mg/dL (ref <200)
HDL: 43 mg/dL — ABNORMAL LOW (ref >=50)
LDL Cholesterol (Calc): 118 mg/dL (calc) — ABNORMAL HIGH
Non-HDL Cholesterol (Calc): 143 mg/dL (calc) — ABNORMAL HIGH (ref <130)
Total CHOL/HDL Ratio: 4.3 (calc) (ref <5.0)
Triglycerides: 132 mg/dL (ref <150)

## 2019-09-02 ENCOUNTER — Other Ambulatory Visit: Payer: Self-pay | Admitting: Family Medicine

## 2019-09-02 DIAGNOSIS — E890 Postprocedural hypothyroidism: Secondary | ICD-10-CM

## 2019-09-02 DIAGNOSIS — I10 Essential (primary) hypertension: Secondary | ICD-10-CM

## 2019-09-02 MED ORDER — LEVOTHYROXINE SODIUM 112 MCG PO TABS: 112.0000 ug | ORAL_TABLET | Freq: Every day | ORAL | 3 refills | Status: DC

## 2019-09-02 NOTE — Progress Notes (Signed)
Repeat thyroid labs ordered

## 2019-09-03 ENCOUNTER — Ambulatory Visit: Payer: Self-pay

## 2019-09-03 NOTE — Chronic Care Management (AMB) (Signed)
  Care Management   Follow Up Note   09/03/2019 Name: SANYA MILLWEE MRN: [id number] DOB: [birth year]  Referred by: [name], FNP Reason for referral : Care Coordination   Anvi ERLEAN HARKNESS is a 69 y.o. year old female who is a primary care patient of [name], [name], FNP. The care management team was consulted for assistance with care management and care coordination needs.    Review of patient status, including review of consultants reports, relevant laboratory and other test results, and collaboration with appropriate care team members and the patient's provider was performed as part of comprehensive patient evaluation and provision of chronic care management services.    LCSW completed CCM outreach attempt today but was unable to reach patient successfully. A HIPPA compliant voice message was left encouraging patient to return call once available. LCSW rescheduled CCM SW appointment as well.  A HIPPA compliant phone message was left for the patient providing contact information and requesting a return call.   [name], BSW, MSW, [email or web address] Phone: [phone]

## 2019-09-04 ENCOUNTER — Other Ambulatory Visit: Payer: Self-pay | Admitting: Pharmacy Technician

## 2019-09-04 NOTE — Patient Outreach (Signed)
[facility]) Care Management  09/04/2019  Pamela Romero Jun 02, 1950 [id number]  Successful call placed to patient regarding patient assistance application(s) for Breztri with [employer] and Proventil HFA with [employer] , HIPAA identifiers verified.   Patient informed she has received the applications but has not completed them yet. Informed patient the packet has 2 applications. One for [employer] which is 1 page and the other for [employer] which if 3-4 pages. Informed patient to fill out the areas marked with a highlighted asterisk and to send back with appropriate documentation listed on the check list. Patient verbalized understanding and is hoping to work on them today. Confirmed patient had name and number if she had questions.  Follow up:  Will route note to embedded [facility] RPh Harlow Asa for case closure if document(s) have not been received in the next 15 business days.  Darshan Solanki P. Jakeira Seeman, [place]  [phone]

## 2019-09-06 LAB — TOXASSURE SELECT 13 (MW), URINE

## 2019-09-11 ENCOUNTER — Telehealth: Payer: Self-pay

## 2019-09-11 ENCOUNTER — Ambulatory Visit: Payer: Self-pay | Admitting: Pharmacist

## 2019-09-11 NOTE — Chronic Care Management (AMB) (Signed)
  Chronic Care Management   Follow Up Note   09/11/2019 Name: TOM BALVIN MRN: [id number] DOB: 08/15/1950  Referred by: [name], FNP Reason for referral : Chronic Care Management (Patient Phone Call)   VICTOR SCHEINER is a 69 y.o. year old female who is a primary care patient of [name], FNP. The CCM team was consulted for assistance with chronic disease management and care coordination needs.    I reached out to [name] by phone today.   Patient reports that she is in the middle of looking for something and asks that I give her a call back.   Call patient back as requested but patient does not answer and unable to leave a message as no voicemail picks up.   Plan  The care management team will reach out to the patient again over the next 30 days.   Harlow Asa, PharmD, [place] [employer] [phone]

## 2019-09-12 ENCOUNTER — Other Ambulatory Visit: Payer: Self-pay | Admitting: Acute Care

## 2019-09-12 DIAGNOSIS — I639 Cerebral infarction, unspecified: Secondary | ICD-10-CM

## 2019-09-22 ENCOUNTER — Other Ambulatory Visit: Payer: Self-pay | Admitting: Family Medicine

## 2019-09-22 DIAGNOSIS — IMO0002 Reserved for concepts with insufficient information to code with codable children: Secondary | ICD-10-CM

## 2019-09-22 DIAGNOSIS — I10 Essential (primary) hypertension: Secondary | ICD-10-CM

## 2019-09-22 NOTE — Telephone Encounter (Signed)
Requested Prescriptions  Pending Prescriptions Disp Refills  . verapamil (CALAN-SR) 120 MG CR tablet [Pharmacy Med Name: VERAPAMIL ER 120 MG TABLET] 90 tablet 1    Sig: TAKE 1 TABLET (120 MG TOTAL) BY MOUTH AT BEDTIME.     Cardiovascular:  Calcium Channel Blockers Passed - 09/22/2019  1:05 AM      Passed - Last BP in normal range    BP Readings from Last 1 Encounters:  08/30/19 126/84         Passed - Valid encounter within last 6 months    Recent Outpatient Visits          3 weeks ago Essential hypertension   [facility], [name], FNP   2 months ago Essential hypertension   [place], DO   3 months ago Memory loss   [place], DO   8 months ago Fall, initial encounter   [facility] [employer], NP   8 months ago Acute bilateral low back pain without sciatica   [facility] [employer], NP      Future Appointments            In 2 days  [facility], [place]   In 3 weeks [place], [name], [facility], [facility]

## 2019-09-24 ENCOUNTER — Encounter: Payer: Self-pay | Admitting: Family Medicine

## 2019-09-24 ENCOUNTER — Other Ambulatory Visit: Payer: Self-pay

## 2019-09-24 ENCOUNTER — Ambulatory Visit (INDEPENDENT_AMBULATORY_CARE_PROVIDER_SITE_OTHER): Payer: Medicare Other

## 2019-09-24 ENCOUNTER — Ambulatory Visit (INDEPENDENT_AMBULATORY_CARE_PROVIDER_SITE_OTHER): Payer: Medicare Other | Admitting: Family Medicine

## 2019-09-24 VITALS — BP 142/105 | HR 87 | Temp 97.1°F | Resp 16 | Ht 68.0 in | Wt 237.4 lb

## 2019-09-24 VITALS — BP 142/98 | HR 72 | Temp 97.1°F | Ht 68.0 in | Wt 237.0 lb

## 2019-09-24 DIAGNOSIS — I10 Essential (primary) hypertension: Secondary | ICD-10-CM | POA: Diagnosis not present

## 2019-09-24 DIAGNOSIS — Z0000 Encounter for general adult medical examination without abnormal findings: Secondary | ICD-10-CM | POA: Diagnosis not present

## 2019-09-24 DIAGNOSIS — Z23 Encounter for immunization: Secondary | ICD-10-CM

## 2019-09-24 DIAGNOSIS — Z1231 Encounter for screening mammogram for malignant neoplasm of breast: Secondary | ICD-10-CM

## 2019-09-24 DIAGNOSIS — Z780 Asymptomatic menopausal state: Secondary | ICD-10-CM

## 2019-09-24 MED ORDER — LISINOPRIL-HYDROCHLOROTHIAZIDE 20-12.5 MG PO TABS: 1.0000 | ORAL_TABLET | Freq: Every day | ORAL | 3 refills | Status: DC

## 2019-09-24 NOTE — Progress Notes (Signed)
Subjective:   Pamela Romero is a 69 y.o. female who presents for an Initial Medicare Annual Wellness Visit.   Review of Systems      Cardiac Risk Factors include: advanced age (>77men, >69 women);dyslipidemia;hypertension;smoking/ tobacco exposure     Objective:    Today's Vitals   09/24/19 0941  Weight: 237 lb 6.4 oz (107.7 kg)  Height: 5\' 8"  (1.727 m)   Body mass index is 36.1 kg/m.  Advanced Directives 09/24/2019 05/22/2019 04/17/2019 06/27/2018 05/31/2018 01/10/2018 12/27/2017  Does Patient Have a Medical Advance Directive? No No No No No No No  Would patient like information on creating a medical advance directive? Yes (MAU/Ambulatory/Procedural Areas - Information given) No - Patient declined - No - Patient declined No - Patient declined No - Patient declined No - Patient declined    Current Medications (verified) Outpatient Encounter Medications as of 09/24/2019  Medication Sig  . Budeson-Glycopyrrol-Formoterol (BREZTRI AEROSPHERE) 160-9-4.8 MCG/ACT AERO Inhale 2 puffs into the lungs 2 (two) times daily.  [name] docusate sodium (COLACE) 100 MG capsule Take 100 mg by mouth 2 (two) times daily as needed.  . ergocalciferol (VITAMIN D2) 1.25 MG (50000 UT) capsule Take by mouth.  . esomeprazole (NEXIUM) 40 MG capsule Take 1 capsule (40 mg total) by mouth daily.  [name] HYDROcodone-acetaminophen (NORCO) 7.5-325 MG tablet Take 1 tablet by mouth every 6 (six) hours as needed for severe pain. Must last 30 days.  [name] levothyroxine (SYNTHROID) 112 MCG tablet Take 1 tablet (112 mcg total) by mouth daily.  [name] lisinopril (ZESTRIL) 20 MG tablet TAKE 1 TABLET BY MOUTH EVERY DAY  . polyethylene glycol powder (GLYCOLAX/MIRALAX) 17 GM/SCOOP powder Take 17 g by mouth daily as needed for mild constipation or moderate constipation.  [name] PROAIR HFA 108 (90 Base) MCG/ACT inhaler INHALE 2 PUFFS INTO THE LUNGS EVERY 4 HOURS AS NEEDED FOR WHEEZING OR SHORTNESS OF BREATH (COUGH  . sertraline (ZOLOFT) 25 MG tablet  Take by mouth.  . SUMAtriptan (IMITREX) 50 MG tablet Take 1 tablet (50 mg total) by mouth once as needed for up to 1 dose for migraine. May repeat one dose in 2 hours if headache persists, for max dose 24 hours  . [START ON 10/21/2019] HYDROcodone-acetaminophen (NORCO) 7.5-325 MG tablet Take 1 tablet by mouth every 6 (six) hours as needed for severe pain. Must last 30 days. (Patient not taking: Reported on 09/24/2019)  . montelukast (SINGULAIR) 10 MG tablet Take 1 tablet (10 mg total) by mouth at bedtime. (Patient not taking: Reported on 08/30/2019)  . ondansetron (ZOFRAN ODT) 4 MG disintegrating tablet Take 1 tablet (4 mg total) by mouth every 8 (eight) hours as needed for nausea or vomiting. (Patient not taking: Reported on 08/28/2019)  . [DISCONTINUED] baclofen (LIORESAL) 10 MG tablet TAKE 1 TABLET BY MOUTH THREE TIMES A DAY AS NEEDED FOR MUSCLE SPASMS (Patient not taking: Reported on 08/26/2019)  . [DISCONTINUED] verapamil (CALAN-SR) 120 MG CR tablet TAKE 1 TABLET (120 MG TOTAL) BY MOUTH AT BEDTIME. (Patient not taking: Reported on 09/24/2019)   No facility-administered encounter medications on file as of 09/24/2019.    Allergies (verified) Patient has no known allergies.   History: Past Medical History:  Diagnosis Date  . Abnormal EKG   . CAD (coronary artery disease)    non critical, by cardiac cath 2006  . Cancer ([place])    endometrial Ca  . Endometrial adenocarcinoma ([place]) 2011  . Hyperlipidemia   . Hypertension   . Osteoarthritis  Right foot, Left knee (Dr. Roland Rack)  . Reflux gastritis   . Thyroid disease   . tobacco abuse   . Vaginal delivery    x 2   Past Surgical History:  Procedure Laterality Date  . BREAST CYST ASPIRATION Right    neg  . BREAST SURGERY Right March 2014   benign [place]  . CARDIAC CATHETERIZATION  2006   40% LAD, EF 60%  . THYROIDECTOMY  2005   [facility], non malignant tumor  . TOTAL ABDOMINAL HYSTERECTOMY     with BSO   Family History  Problem  Relation Age of Onset  . Alcohol abuse Father   . Cancer Maternal Aunt        stomach  . Alcohol abuse Maternal Aunt   . Alcohol abuse Mother   . Heart disease Brother        MI age 68- non smoker  . Cancer Brother        Pancreatic  . Cancer Brother        colon, dx'd age 68  . Cancer Brother        stomach, paternal half brother  . Breast cancer Neg Hx    Social History   Socioeconomic History  . Marital status: Married    Spouse name: Not on file  . Number of children: 0  . Years of education: Not on file  . Highest education level: Not on file  Occupational History  . Occupation: retired   Tobacco Use  . Smoking status: Former Smoker    Packs/day: 0.50    Years: 50.00    Pack years: 25.00    Quit date: 05/11/2018    Years since quitting: 1.3  . Smokeless tobacco: Former Network engineer  . Vaping Use: Never used  Substance and Sexual Activity  . Alcohol use: No  . Drug use: No  . Sexual activity: Not on file  Other Topics Concern  . Not on file  Social History Narrative   Lives with spouse, takes care of her mother.   Had 2 children, one died at birth, the other given up for adoption (was unmarried).   Social Determinants of Health   Financial Resource Strain: Medium Risk  . Difficulty of Paying Living Expenses: Somewhat hard  Food Insecurity: No Food Insecurity  . Worried About Charity fundraiser in the Last Year: Never true  . Ran Out of Food in the Last Year: Never true  Transportation Needs: No Transportation Needs  . Lack of Transportation (Medical): No  . Lack of Transportation (Non-Medical): No  Physical Activity: Inactive  . Days of Exercise per Week: 0 days  . Minutes of Exercise per Session: 0 min  Stress: No Stress Concern Present  . Feeling of Stress : Only a little  Social Connections:   . Frequency of Communication with Friends and Family:   . Frequency of Social Gatherings with Friends and Family:   . Attends Religious Services:   .  Active Member of Clubs or Organizations:   . Attends Archivist Meetings:   [name] Marital Status:     Tobacco Counseling Counseling given: Not Answered   Clinical Intake:  Pre-visit preparation completed: Yes  Pain : No/denies pain     Nutritional Status: BMI > 30  Obese Nutritional Risks: None Diabetes: No  How often do you need to have someone help you when you read instructions, pamphlets, or other written materials from your doctor or pharmacy?:  1 - Never    Interpreter Needed?: No  Information entered by :: Nadeem Romanoski,LPN   Activities of Daily Living In your present state of health, do you have any difficulty performing the following activities: 09/24/2019 11/06/2018  Hearing? Y Y  Comment no hearing aids -  Vision? N Y  Comment eyeglasses, no eye dr -  Difficulty concentrating or making decisions? Tempie Donning  Comment has CT today -  Walking or climbing stairs? Y Y  Comment COPD -  Dressing or bathing? N N  Doing errands, shopping? N N  Preparing Food and eating ? N -  Using the Toilet? N -  In the past six months, have you accidently leaked urine? N -  Do you have problems with loss of bowel control? N -  Managing your Medications? N -  Managing your Finances? N -  Housekeeping or managing your Housekeeping? N -  Some recent data might be hidden     Immunizations and Health Maintenance Immunization History  Administered Date(s) Administered  . Fluad Quad(high Dose 65+) 01/11/2019  . Influenza Whole 01/24/2012  . Influenza,inj,Quad PF,6+ Mos 05/14/2014, 01/21/2015  . PFIZER SARS-COV-2 Vaccination 06/13/2019, 07/08/2019  . Pneumococcal Conjugate-13 01/21/2015  . Pneumococcal Polysaccharide-23 02/07/2012  . Tdap 02/07/2012   Health Maintenance Due  Topic Date Due  . DEXA SCAN  Never done  . PNA vac Low Risk Adult (2 of 2 - PPSV23) 09/20/2017  . MAMMOGRAM  08/03/2018    Patient Care Team: [name], FNP as PCP - General (Family  Medicine) Vanita Ingles, RN as Case Manager ([place]) [place], Virl Diamond, [facility] as Pharmacist (Pharmacist)  Indicate any recent Medical Services you may have received from other than [facility] providers in the past year (date may be approximate).     Assessment:   This is a routine wellness examination for Ramina.  Hearing/Vision screen No exam data present  Dietary issues and exercise activities discussed: Current Exercise Habits: Home exercise routine, Type of exercise: walking, Time (Minutes): 30, Frequency (Times/Week): 2, Weekly Exercise (Minutes/Week): 60, Intensity: Mild, Exercise limited by: respiratory conditions(s)  Goals Addressed            This Visit's Progress   . Exercise 3x per week (30 min per time)        Depression Screen PHQ 2/9 Scores 09/24/2019 08/28/2019 07/01/2019 06/13/2019 05/22/2019 11/21/2018 11/06/2018  PHQ - 2 Score 6 0 0 0 0 0 6  PHQ- 9 Score 15 - - - - - 20    Fall Risk Fall Risk  09/24/2019 08/28/2019 07/03/2019 07/01/2019 06/10/2019  Falls in the past year? 1 0 0 0 0  Comment - - - - -  Number falls in past yr: 0 - - 0 0  Injury with Fall? 1 - - 0 0  Follow up - - - Falls evaluation completed Falls evaluation completed    [place]:  Any stairs in or around the home? Yes  steps going in home  If so, are there any without handrails? No   Home free of loose throw rugs in walkways, pet beds, electrical cords, etc? Yes  Adequate lighting in your home to reduce risk of falls? Yes   ASSISTIVE DEVICES UTILIZED TO PREVENT FALLS:  Life alert? No  Use of a cane, walker or w/c? Yes has them if needed  Grab bars in the bathroom? No  Shower chair or bench in shower? No  Elevated toilet  seat or a handicapped toilet? No    TIMED UP AND GO:  Was the test performed? Yes .  Length of time to ambulate 10 feet: 8 sec.   GAIT:  Appearance of gait: Gait steady and fast without the use of an assistive  device. Intervention(s) required? No   DME/home health order needed?  No    Cognitive Function: MMSE - Mini Mental State Exam 08/30/2019  Orientation to time 4  Orientation to Place 5  Registration 3  Attention/ Calculation 5  Recall 2  Language- name 2 objects 2  Language- repeat 1  Language- follow 3 step command 3  Language- read & follow direction 1  Write a sentence 1  Copy design 1  Total score 28     6CIT Screen 06/10/2019  What Year? 0 points  What month? 0 points  What time? 0 points  Count back from 20 0 points  Months in reverse 0 points  Repeat phrase 2 points  Total Score 2    Screening Tests Health Maintenance  Topic Date Due  . DEXA SCAN  Never done  . PNA vac Low Risk Adult (2 of 2 - PPSV23) 09/20/2017  . MAMMOGRAM  08/03/2018  . COLONOSCOPY  09/23/2020 (Originally 09/21/2002)  . INFLUENZA VACCINE  11/10/2019  . TETANUS/TDAP  02/06/2022  . COVID-19 Vaccine  Completed  . Hepatitis C Screening  Completed    Qualifies for Shingles Vaccine? Yes  Zostavax completed n/a. Due for Shingrix. Education has been provided regarding the importance of this vaccine. Pt has been advised to call insurance company to determine out of pocket expense. Advised may also receive vaccine at local pharmacy or Health Dept. Verbalized acceptance and understanding.  Tdap: up to date   Flu Vaccine: up to date   Pneumococcal Vaccine: up to date   COVID-19 vaccine: Vaccines received  Cancer Screenings:  Colorectal Screening: declined   Mammogram: ordered. Patient to call and schedule   Bone Density: ordered  Lung Cancer Screening: (Low Dose CT Chest recommended if Age 39-80 years, 30 pack-year currently smoking OR have quit w/in 15years.) does not qualify.     Additional Screening:  Hepatitis C Screening: does qualify; Completed 2016   Dental Screening: Recommended annual dental exams for proper oral hygiene  Community Resource Referral:  CRR required this  visit?  No       Plan:  I have personally reviewed and addressed the Medicare Annual Wellness questionnaire and have noted the following in the patient's chart:  A. Medical and social history B. Use of alcohol, tobacco or illicit drugs  C. Current medications and supplements D. Functional ability and status E.  Nutritional status F.  Physical activity G. Advance directives H. List of other physicians I.  Hospitalizations, surgeries, and ER visits in previous 12 months J.  [place] such as hearing and vision if needed, cognitive and depression L. Referrals and appointments   In addition, I have reviewed and discussed with patient certain preventive protocols, quality metrics, and best practice recommendations. A written personalized care plan for preventive services as well as general preventive health recommendations were provided to patient.   Signed,    Bevelyn Ngo, LPN   2/45/8099  Nurse Health Advisor    Nurse Notes: Patient states neurologist placed referral to psychiatry but had a hard time doing the televisit and is requesting a different referral to one she can see locally.    Patient feels the colace and miralax isn't  helping her, is requesting something new.   Patient BP was 142/105, recheck manually 140/104. Patient states she took her Bp medication this morning and does have a slight headache. She also states this is what her BP runs at home normally.  Informed PCP who will see patient today while in office.

## 2019-09-24 NOTE — Patient Instructions (Signed)
Pamela Romero , Thank you for taking time to come for your Medicare Wellness Visit. I appreciate your ongoing commitment to your health goals. Please review the following plan we discussed and let me know if I can assist you in the future.   Screening recommendations/referrals: Colonoscopy: declined  Mammogram: Please call [phone] to schedule your mammogram.  Bone Density: Please call [phone] to schedule with mammogram  Recommended yearly ophthalmology/optometry visit for glaucoma screening and checkup Recommended yearly dental visit for hygiene and checkup  Vaccinations: Influenza vaccine: due 12/2019 Pneumococcal vaccine: completed  Tdap vaccine: up to date  Shingles vaccine: shingrix eligible, check with your insurance company for coverage    Covid-19:completed   Advanced directives: Advance directive discussed with you today. I have provided a copy for you to complete at home and have notarized. Once this is complete please bring a copy in to our office so we can scan it into your chart.  Conditions/risks identified: Your provider would like to you have your annual eye exam. Please contact your current eye doctor or here are some good options for you to contact.   [facility]         Address: [address] [place], [place] [address]    Phone: [phone]       Website: [email or web address] Address: [address] [place], [place] [address]  Phone: [phone]  Website: [email or web address]  [facility]  Address: [place], [place], [place] [address] Phone: [phone]   [facility]  Address: [place], [place], [place] [address]  Phone: [phone]   [facility] Address: [place], [place], [place] [address]  Phone: [phone]   Next appointment: Follow up in one year for your annual wellness visit    Preventive Care 65 Years and Older, Female Preventive care refers to lifestyle choices  and visits with your health care provider that can promote health and wellness. What does preventive care include?  A yearly physical exam. This is also called an annual well check.  Dental exams once or twice a year.  Routine eye exams. Ask your health care provider how often you should have your eyes checked.  Personal lifestyle choices, including:  Daily care of your teeth and gums.  Regular physical activity.  Eating a healthy diet.  Avoiding tobacco and drug use.  Limiting alcohol use.  Practicing safe sex.  Taking low-dose aspirin every day.  Taking vitamin and mineral supplements as recommended by your health care provider. What happens during an annual well check? The services and screenings done by your health care provider during your annual well check will depend on your age, overall health, lifestyle risk factors, and family history of disease. Counseling  Your health care provider may ask you questions about your:  Alcohol use.  Tobacco use.  Drug use.  Emotional well-being.  Home and relationship well-being.  Sexual activity.  Eating habits.  History of falls.  Memory and ability to understand (cognition).  Work and work Statistician.  Reproductive health. Screening  You may have the following tests or measurements:  Height, weight, and BMI.  Blood pressure.  Lipid and cholesterol levels. These may be checked every 5 years, or more frequently if you are over 58 years old.  Skin check.  Lung cancer screening. You may have this screening every year starting at age 63 if you have a 30-pack-year history of smoking and  currently smoke or have quit within the past 15 years.  Fecal occult blood test (FOBT) of the stool. You may have this test every year starting at age 56.  Flexible sigmoidoscopy or colonoscopy. You may have a sigmoidoscopy every 5 years or a colonoscopy every 10 years starting at age 73.  Hepatitis C blood test.  Hepatitis  B blood test.  Sexually transmitted disease (STD) testing.  Diabetes screening. This is done by checking your blood sugar (glucose) after you have not eaten for a while (fasting). You may have this done every 1-3 years.  Bone density scan. This is done to screen for osteoporosis. You may have this done starting at age 74.  Mammogram. This may be done every 1-2 years. Talk to your health care provider about how often you should have regular mammograms. Talk with your health care provider about your test results, treatment options, and if necessary, the need for more tests. Vaccines  Your health care provider may recommend certain vaccines, such as:  Influenza vaccine. This is recommended every year.  Tetanus, diphtheria, and acellular pertussis (Tdap, Td) vaccine. You may need a Td booster every 10 years.  Zoster vaccine. You may need this after age 51.  Pneumococcal 13-valent conjugate (PCV13) vaccine. One dose is recommended after age 65.  Pneumococcal polysaccharide (PPSV23) vaccine. One dose is recommended after age 30. Talk to your health care provider about which screenings and vaccines you need and how often you need them. This information is not intended to replace advice given to you by your health care provider. Make sure you discuss any questions you have with your health care provider. Document Released: 04/24/2015 Document Revised: 12/16/2015 Document Reviewed: 01/27/2015 Elsevier Interactive Patient Education  2017 [place] Prevention in the Home Falls can cause injuries. They can happen to people of all ages. There are many things you can do to make your home safe and to help prevent falls. What can I do on the outside of my home?  Regularly fix the edges of walkways and driveways and fix any cracks.  Remove anything that might make you trip as you walk through a door, such as a raised step or threshold.  Trim any bushes or trees on the path to your  home.  Use bright outdoor lighting.  Clear any walking paths of anything that might make someone trip, such as rocks or tools.  Regularly check to see if handrails are loose or broken. Make sure that both sides of any steps have handrails.  Any raised decks and porches should have guardrails on the edges.  Have any leaves, snow, or ice cleared regularly.  Use sand or salt on walking paths during winter.  Clean up any spills in your garage right away. This includes oil or grease spills. What can I do in the bathroom?  Use night lights.  Install grab bars by the toilet and in the tub and shower. Do not use towel bars as grab bars.  Use non-skid mats or decals in the tub or shower.  If you need to sit down in the shower, use a plastic, non-slip stool.  Keep the floor dry. Clean up any water that spills on the floor as soon as it happens.  Remove soap buildup in the tub or shower regularly.  Attach bath mats securely with double-sided non-slip rug tape.  Do not have throw rugs and other things on the floor that can make you trip. What can I do  in the bedroom?  Use night lights.  Make sure that you have a light by your bed that is easy to reach.  Do not use any sheets or blankets that are too big for your bed. They should not hang down onto the floor.  Have a firm chair that has side arms. You can use this for support while you get dressed.  Do not have throw rugs and other things on the floor that can make you trip. What can I do in the kitchen?  Clean up any spills right away.  Avoid walking on wet floors.  Keep items that you use a lot in easy-to-reach places.  If you need to reach something above you, use a strong step stool that has a grab bar.  Keep electrical cords out of the way.  Do not use floor polish or wax that makes floors slippery. If you must use wax, use non-skid floor wax.  Do not have throw rugs and other things on the floor that can make you  trip. What can I do with my stairs?  Do not leave any items on the stairs.  Make sure that there are handrails on both sides of the stairs and use them. Fix handrails that are broken or loose. Make sure that handrails are as long as the stairways.  Check any carpeting to make sure that it is firmly attached to the stairs. Fix any carpet that is loose or worn.  Avoid having throw rugs at the top or bottom of the stairs. If you do have throw rugs, attach them to the floor with carpet tape.  Make sure that you have a light switch at the top of the stairs and the bottom of the stairs. If you do not have them, ask someone to add them for you. What else can I do to help prevent falls?  Wear shoes that:  Do not have high heels.  Have rubber bottoms.  Are comfortable and fit you well.  Are closed at the toe. Do not wear sandals.  If you use a stepladder:  Make sure that it is fully opened. Do not climb a closed stepladder.  Make sure that both sides of the stepladder are locked into place.  Ask someone to hold it for you, if possible.  Clearly mark and make sure that you can see:  Any grab bars or handrails.  First and last steps.  Where the edge of each step is.  Use tools that help you move around (mobility aids) if they are needed. These include:  Canes.  Walkers.  Scooters.  Crutches.  Turn on the lights when you go into a dark area. Replace any light bulbs as soon as they burn out.  Set up your furniture so you have a clear path. Avoid moving your furniture around.  If any of your floors are uneven, fix them.  If there are any pets around you, be aware of where they are.  Review your medicines with your doctor. Some medicines can make you feel dizzy. This can increase your chance of falling. Ask your doctor what other things that you can do to help prevent falls. This information is not intended to replace advice given to you by your health care provider. Make  sure you discuss any questions you have with your health care provider. Document Released: 01/22/2009 Document Revised: 09/03/2015 Document Reviewed: 05/02/2014 Elsevier Interactive Patient Education  2017 [employer].

## 2019-09-24 NOTE — Patient Instructions (Signed)
I have sent in a prescription for Lisinopril-HCTZ 20-12.5mg  to begin taking today.  Take this once per day.  STOP the Lisinopril 20mg .  We will see you back in clinic in 4 weeks to recheck your blood pressure.  You will receive a survey after today's visit either digitally by e-mail or paper by [employer]. Your experiences and feedback matter to [place].  Please respond so we know how we are doing as we provide care for you.  Call us with any questions/concerns/needs.  It is my goal to be available to you for your health concerns.  Thanks for choosing me to be a partner in your healthcare needs!  Harlin Rain, FNP-C Family Nurse Practitioner [facility] Phone: [phone]

## 2019-09-24 NOTE — Progress Notes (Signed)
Subjective:    Patient ID: Pamela Romero, female    DOB: 1950/07/24, 69 y.o.   MRN: [id number]  Pamela Romero is a 69 y.o. female presenting on 09/24/2019 for Hypertension (elevated bp reading to day w/ persistent headache only on the left side today. Pt state she have chronic headaches. She denies chest pain, dizziness or any other symptoms. )   HPI  [name] was having an appointment with our [place] provider in clinic and noted to have a blood pressure that >140/100 with headache.  Requested she be seen for HTN re-evaluation.  Hypertension - She is checking BP at home or outside of clinic.    - Current medications: lisinopril 20mg  daily, tolerating well without side effects - She is symptomatic with headache. - Pt denies lightheadedness, dizziness, changes in vision, chest tightness/pressure, palpitations, leg swelling, sudden loss of speech or loss of consciousness. - She  reports no regular exercise routine. - Her diet is high in salt, high in fat, and high in carbohydrates.   Depression screen [facility] 2/9 09/24/2019 08/28/2019 07/01/2019  Decreased Interest 3 0 0  Down, Depressed, Hopeless 3 0 0  PHQ - 2 Score 6 0 0  Altered sleeping 3 - -  Tired, decreased energy 3 - -  Change in appetite 1 - -  Feeling bad or failure about yourself  1 - -  Trouble concentrating 1 - -  Moving slowly or fidgety/restless 0 - -  Suicidal thoughts 0 - -  PHQ-9 Score 15 - -  Difficult doing work/chores Somewhat difficult - -  Some recent data might be hidden    Social History   Tobacco Use  . Smoking status: Former Smoker    Packs/day: 0.50    Years: 50.00    Pack years: 25.00    Quit date: 05/11/2018    Years since quitting: 1.3  . Smokeless tobacco: Former Network engineer  . Vaping Use: Never used  Substance Use Topics  . Alcohol use: No  . Drug use: No    Review of Systems  Constitutional: Negative.   HENT: Negative.   Eyes: Negative.   Respiratory: Negative.     Cardiovascular: Negative.   Gastrointestinal: Negative.   Endocrine: Negative.   Genitourinary: Negative.   Musculoskeletal: Negative.   Skin: Negative.   Allergic/Immunologic: Negative.   Neurological: Positive for headaches. Negative for dizziness, tremors, seizures, syncope, facial asymmetry, speech difficulty, weakness, light-headedness and numbness.  Hematological: Negative.   Psychiatric/Behavioral: Negative.    Per HPI unless specifically indicated above     Objective:    BP (!) 142/98 (BP Location: Left Arm, Patient Position: Sitting, Cuff Size: Normal)   Pulse 72   Temp (!) 97.1 F (36.2 C) (Temporal)   Ht 5\' 8"  (1.727 m)   Wt 237 lb (107.5 kg)   BMI 36.04 kg/m   Wt Readings from Last 3 Encounters:  09/24/19 237 lb (107.5 kg)  09/24/19 237 lb 6.4 oz (107.7 kg)  08/30/19 241 lb (109.3 kg)    Physical Exam Vitals reviewed.  Constitutional:      General: She is not in acute distress.    Appearance: Normal appearance. She is well-developed and well-groomed. She is obese. She is not ill-appearing or toxic-appearing.  HENT:     Head: Normocephalic.     Nose:     Comments: Pamela Romero is in place, covering mouth and nose  Eyes:     General: Lids are normal. Vision grossly intact.  Right eye: No discharge.        Left eye: No discharge.     Extraocular Movements: Extraocular movements intact.     Conjunctiva/sclera: Conjunctivae normal.     Pupils: Pupils are equal, round, and reactive to light.  Cardiovascular:     Rate and Rhythm: Normal rate and regular rhythm.     Pulses: Normal pulses.     Heart sounds: Normal heart sounds. No murmur heard.  No friction rub. No gallop.   Pulmonary:     Effort: Pulmonary effort is normal. No respiratory distress.     Breath sounds: Normal breath sounds.  Musculoskeletal:     Right lower leg: No edema.     Left lower leg: No edema.  Skin:    General: Skin is warm and dry.     Capillary Refill: Capillary refill takes  less than 2 seconds.  Neurological:     General: No focal deficit present.     Mental Status: She is alert and oriented to person, place, and time.     Cranial Nerves: Cranial nerves are intact. No cranial nerve deficit.     Sensory: Sensation is intact. No sensory deficit.     Motor: Motor function is intact. No weakness.     Coordination: Coordination is intact. Coordination normal.     Gait: Gait is intact. Gait normal.     Deep Tendon Reflexes: Reflexes are normal and symmetric.  Psychiatric:        Attention and Perception: Attention and perception normal.        Mood and Affect: Mood and affect normal.        Speech: Speech normal.        Behavior: Behavior normal. Behavior is cooperative.        Thought Content: Thought content normal.        Cognition and Memory: Cognition and memory normal.        Judgment: Judgment normal.    Results for orders placed or performed in visit on 08/30/19  CBC with Differential  Result Value Ref Range   WBC 4.3 3.8 - 10.8 Thousand/uL   RBC 3.60 (L) 3.80 - 5.10 Million/uL   Hemoglobin 11.9 11.7 - 15.5 g/dL   HCT 36.2 35 - 45 %   MCV 100.6 (H) 80.0 - 100.0 fL   MCH 33.1 (H) 27.0 - 33.0 pg   MCHC 32.9 32.0 - 36.0 g/dL   RDW 12.4 11.0 - 15.0 %   Platelets 129 (L) 140 - 400 Thousand/uL   MPV 10.8 7.5 - 12.5 fL   Neutro Abs 2,941 1,500 - 7,800 cells/uL   Lymphs Abs 976 850 - 3,900 cells/uL   Absolute Monocytes 310 200 - 950 cells/uL   Eosinophils Absolute 52 15 - 500 cells/uL   Basophils Absolute 22 0 - 200 cells/uL   Neutrophils Relative % 68.4 %   Total Lymphocyte 22.7 %   Monocytes Relative 7.2 %   Eosinophils Relative 1.2 %   Basophils Relative 0.5 %   Smear Review    COMPLETE METABOLIC PANEL WITH GFR  Result Value Ref Range   Glucose, Bld 89 65 - 99 mg/dL   BUN 14 7 - 25 mg/dL   Creat 0.87 0.50 - 0.99 mg/dL   GFR, Est Non African American 69 > OR = 60 mL/min/1.44m2   GFR, Est African American 80 > OR = 60 mL/min/1.20m2    BUN/Creatinine Ratio NOT APPLICABLE 6 - 22 (calc)   Sodium 141  135 - 146 mmol/L   Potassium 4.4 3.5 - 5.3 mmol/L   Chloride 105 98 - 110 mmol/L   CO2 29 20 - 32 mmol/L   Calcium 9.0 8.6 - 10.4 mg/dL   Total Protein 5.8 (L) 6.1 - 8.1 g/dL   Albumin 3.6 3.6 - 5.1 g/dL   Globulin 2.2 1.9 - 3.7 g/dL (calc)   AG Ratio 1.6 1.0 - 2.5 (calc)   Total Bilirubin 0.5 0.2 - 1.2 mg/dL   Alkaline phosphatase (APISO) 56 37 - 153 U/L   AST 17 10 - 35 U/L   ALT 6 6 - 29 U/L  Lipid Profile  Result Value Ref Range   Cholesterol 186 <200 mg/dL   HDL 43 (L) > OR = 50 mg/dL   Triglycerides 132 <150 mg/dL   LDL Cholesterol (Calc) 118 (H) mg/dL (calc)   Total CHOL/HDL Ratio 4.3 <5.0 (calc)   Non-HDL Cholesterol (Calc) 143 (H) <130 mg/dL (calc)  Thyroid Panel With TSH  Result Value Ref Range   T3 Uptake 33 22 - 35 %   T4, Total 18.7 (H) 5.1 - 11.9 mcg/dL   Free Thyroxine Index 6.2 (H) 1.4 - 3.8   TSH 0.66 0.40 - 4.50 mIU/L      Assessment & Plan:   Problem List Items Addressed This Visit      Cardiovascular and Mediastinum   Essential hypertension - Primary    Uncontrolled hypertension.  BP is not at goal < 130/80.  Pt reports working on lifestyle modifications.  Taking medications tolerating well without side effects.  Plan: 1. BEGIN Taking Lisinopril-HCTZ 20-12.5mg  beginning tomorrow.  STOP lisinopril 20mg  daily 2. Encouraged heart healthy diet and increasing exercise to 30 minutes most days of the week, going no more than 2 days in a row without exercise. 3. Check BP 1-2 x per week at home, keep log, and bring to clinic at next appointment. 4. Follow up 4 weeks for blood pressure follow up.         Relevant Medications   lisinopril-hydrochlorothiazide (ZESTORETIC) 20-12.5 MG tablet      Meds ordered this encounter  Medications  . lisinopril-hydrochlorothiazide (ZESTORETIC) 20-12.5 MG tablet    Sig: Take 1 tablet by mouth daily.    Dispense:  90 tablet    Refill:  3      Follow  up plan: Return in about 4 weeks (around 10/22/2019) for HTN F.U.   Harlin Rain, [place] [facility] Nurse Practitioner [facility] 09/24/2019, 11:47 AM

## 2019-09-24 NOTE — Assessment & Plan Note (Signed)
Uncontrolled hypertension.  BP is not at goal < 130/80.  Pt reports working on lifestyle modifications.  Taking medications tolerating well without side effects.  Plan: 1. BEGIN Taking Lisinopril-HCTZ 20-12.5mg  beginning tomorrow.  STOP lisinopril 20mg  daily 2. Encouraged heart healthy diet and increasing exercise to 30 minutes most days of the week, going no more than 2 days in a row without exercise. 3. Check BP 1-2 x per week at home, keep log, and bring to clinic at next appointment. 4. Follow up 4 weeks for blood pressure follow up.

## 2019-09-25 ENCOUNTER — Ambulatory Visit
Admission: RE | Admit: 2019-09-25 | Discharge: 2019-09-25 | Disposition: A | Discharge status: Home / Self Care | Payer: Medicare Other | Source: Ambulatory Visit | Attending: Acute Care | Admitting: Acute Care

## 2019-09-25 ENCOUNTER — Ambulatory Visit (HOSPITAL_BASED_OUTPATIENT_CLINIC_OR_DEPARTMENT_OTHER): Payer: Medicare Other | Admitting: Student in an Organized Health Care Education/Training Program

## 2019-09-25 ENCOUNTER — Encounter: Payer: Self-pay | Admitting: Student in an Organized Health Care Education/Training Program

## 2019-09-25 ENCOUNTER — Other Ambulatory Visit: Payer: Self-pay | Admitting: Family Medicine

## 2019-09-25 VITALS — BP 146/103 | HR 91 | Temp 97.6°F | Resp 18 | Ht 68.0 in | Wt 210.0 lb

## 2019-09-25 DIAGNOSIS — M1712 Unilateral primary osteoarthritis, left knee: Secondary | ICD-10-CM

## 2019-09-25 DIAGNOSIS — I639 Cerebral infarction, unspecified: Secondary | ICD-10-CM | POA: Diagnosis present

## 2019-09-25 MED ORDER — LIDOCAINE HCL (PF) 1 % IJ SOLN
INTRAMUSCULAR | Status: AC
  Filled 2019-09-25: qty 5

## 2019-09-25 MED ORDER — LIDOCAINE HCL (PF) 1 % IJ SOLN
5.0000 mL | Freq: Once | INTRAMUSCULAR | Status: AC
  Administered 2019-09-25: 5 mL

## 2019-09-25 MED ORDER — SODIUM HYALURONATE (VISCOSUP) 20 MG/2ML IX SOSY
2.0000 mL | PREFILLED_SYRINGE | Freq: Once | INTRA_ARTICULAR | Status: AC
  Administered 2019-09-25: 2 mL via INTRA_ARTICULAR

## 2019-09-25 NOTE — Progress Notes (Signed)
Patient's Name: Pamela Romero  MRN: [id number]  Referring Provider: No ref. provider found  DOB: [birth year]  PCP: [name], FNP  DOS: 09/25/2019  Note by: Gillis Santa, MD  Service setting: Ambulatory outpatient  Specialty: Interventional Pain Management  Patient type: Established  Location: [facility] (AMB) Pain Management Facility  Visit type: Interventional Procedure   Primary Reason for Visit: Interventional Pain Management Treatment. CC: Knee Pain (left)  Procedure:          Anesthesia, Analgesia, Anxiolysis:  Type: Therapeutic Intra-Articular Hyalgan Knee Injection #5  Region: Medial infrapatellar Knee Region Level: Knee Joint Laterality: Left knee  Type: Local Anesthesia Indication(s): Analgesia         Local Anesthetic: Lidocaine 1-2% Route: Infiltration ([place]/IM) IV Access: Declined Sedation: Declined   Position: Sitting   Indications: 1. Primary osteoarthritis of left knee    Pain Score: Pre-procedure: 7 /10 Post-procedure: 3/10   Pre-op Assessment:  Pamela Romero is a 69 y.o. (year old), female patient, seen today for interventional treatment. She  has a past surgical history that includes Thyroidectomy (2005); Cardiac catheterization (2006); Total abdominal hysterectomy; Breast surgery (Right, March 2014); and Breast cyst aspiration (Right). Pamela Romero has a current medication list which includes the following prescription(s): breztri aerosphere, docusate sodium, ergocalciferol, esomeprazole, hydrocodone-acetaminophen, [START ON 10/21/2019] hydrocodone-acetaminophen, levothyroxine, lisinopril, montelukast, ondansetron, polyethylene glycol powder, proair hfa, sertraline, sumatriptan, and lisinopril-hydrochlorothiazide. Her primarily concern today is the Knee Pain (left)  Initial Vital Signs:  Pulse/HCG Rate: 92  Temp: 97.6 F (36.4 C) Resp: 18 BP: (!) 172/98 SpO2: 98 %  BMI: Estimated body mass index is 31.93 kg/m as calculated from the following:   Height as of  this encounter: 5\' 8"  (1.727 m).   Weight as of this encounter: 210 lb (95.3 kg).  Risk Assessment: Allergies: Reviewed. She has No Known Allergies.  Allergy Precautions: None required Coagulopathies: Reviewed. None identified.  Blood-thinner therapy: None at this time Active Infection(s): Reviewed. None identified. Pamela Romero is afebrile  Site Confirmation: Pamela Romero was asked to confirm the procedure and laterality before marking the site Procedure checklist: Completed Consent: Before the procedure and under the influence of no sedative(s), amnesic(s), or anxiolytics, the patient was informed of the treatment options, risks and possible complications. To fulfill our ethical and legal obligations, as recommended by the American Medical Association's Code of Ethics, I have informed the patient of my clinical impression; the nature and purpose of the treatment or procedure; the risks, benefits, and possible complications of the intervention; the alternatives, including doing nothing; the risk(s) and benefit(s) of the alternative treatment(s) or procedure(s); and the risk(s) and benefit(s) of doing nothing. The patient was provided information about the general risks and possible complications associated with the procedure. These may include, but are not limited to: failure to achieve desired goals, infection, bleeding, organ or nerve damage, allergic reactions, paralysis, and death. In addition, the patient was informed of those risks and complications associated to the procedure, such as failure to decrease pain; infection; bleeding; organ or nerve damage with subsequent damage to sensory, motor, and/or autonomic systems, resulting in permanent pain, numbness, and/or weakness of one or several areas of the body; allergic reactions; (i.e.: anaphylactic reaction); and/or death. Furthermore, the patient was informed of those risks and complications associated with the medications. These include, but  are not limited to: allergic reactions (i.e.: anaphylactic or anaphylactoid reaction(s)); adrenal axis suppression; blood sugar elevation that in diabetics may result in ketoacidosis or comma; water retention that  in patients with history of congestive heart failure may result in shortness of breath, pulmonary edema, and decompensation with resultant heart failure; weight gain; swelling or edema; medication-induced neural toxicity; particulate matter embolism and blood vessel occlusion with resultant organ, and/or nervous system infarction; and/or aseptic necrosis of one or more joints. Finally, the patient was informed that Medicine is not an exact science; therefore, there is also the possibility of unforeseen or unpredictable risks and/or possible complications that may result in a catastrophic outcome. The patient indicated having understood very clearly. We have given the patient no guarantees and we have made no promises. Enough time was given to the patient to ask questions, all of which were answered to the patient's satisfaction. Pamela Romero has indicated that she wanted to continue with the procedure. Attestation: I, the ordering provider, attest that I have discussed with the patient the benefits, risks, side-effects, alternatives, likelihood of achieving goals, and potential problems during recovery for the procedure that I have provided informed consent. Date  Time: 09/25/2019  9:50 AM  Pre-Procedure Preparation:  Monitoring: As per clinic protocol. Respiration, ETCO2, SpO2, BP, heart rate and rhythm monitor placed and checked for adequate function Safety Precautions: Patient was assessed for positional comfort and pressure points before starting the procedure. Time-out: I initiated and conducted the "Time-out" before starting the procedure, as per protocol. The patient was asked to participate by confirming the accuracy of the "Time Out" information. Verification of the correct person, site,  and procedure were performed and confirmed by me, the nursing staff, and the patient. "Time-out" conducted as per Joint Commission's Universal Protocol (UP.01.01.01). Time: 1009  Description of Procedure:          Target Area: Knee Joint Approach: Just above the Medial tibial plateau, lateral to the infrapatellar tendon. Area Prepped: Entire knee area, from the mid-thigh to the mid-shin. Prepping solution: DuraPrep (Iodine Povacrylex [0.7% available iodine] and Isopropyl Alcohol, 74% w/w) Safety Precautions: Aspiration looking for blood return was conducted prior to all injections. At no point did we inject any substances, as a needle was being advanced. No attempts were made at seeking any paresthesias. Safe injection practices and needle disposal techniques used. Medications properly checked for expiration dates. SDV (single dose vial) medications used. Description of the Procedure: Protocol guidelines were followed. The patient was placed in position over the fluoroscopy table. The target area was identified and the area prepped in the usual manner. Skin & deeper tissues infiltrated with local anesthetic. Appropriate amount of time allowed to pass for local anesthetics to take effect. The procedure needles were then advanced to the target area. Proper needle placement secured. Negative aspiration confirmed. Solution injected in intermittent fashion, asking for systemic symptoms every 0.5cc of injectate. The needles were then removed and the area cleansed, making sure to leave some of the prepping solution back to take advantage of its long term bactericidal properties. Vitals:   09/25/19 0952 09/25/19 0954 09/25/19 1012  BP:  (!) 172/98 (!) 146/103  Pulse:  92 91  Resp:  18 18  Temp: 97.6 F (36.4 C)    SpO2:  98% 97%  Weight: 210 lb (95.3 kg)    Height: 5\' 8"  (1.727 m)      Start Time: 1009 hrs. End Time: 1010 hrs. Materials:  Needle(s) Type: Regular needle Gauge: 25G Length:  1.5-in Medication(s): Please see orders for medications and dosing details. 2 cc of Hyalgan injected Imaging Guidance:          Type  of Imaging Technique: None used Indication(s): N/A Exposure Time: No patient exposure Contrast: None used. Fluoroscopic Guidance: N/A Ultrasound Guidance: N/A Interpretation: N/A  Antibiotic Prophylaxis:   Anti-infectives (From admission, onward)   None     Indication(s): None identified  Post-operative Assessment:  Post-procedure Vital Signs:  Pulse/HCG Rate: 91  Temp: 97.6 F (36.4 C) Resp: 18 BP: (!) 146/103 SpO2: 97 %  EBL: None  Complications: No immediate post-treatment complications observed by team, or reported by patient.  Note: The patient tolerated the entire procedure well. A repeat set of vitals were taken after the procedure and the patient was kept under observation following institutional policy, for this type of procedure. Post-procedural neurological assessment was performed, showing return to baseline, prior to discharge. The patient was provided with post-procedure discharge instructions, including a section on how to identify potential problems. Should any problems arise concerning this procedure, the patient was given instructions to immediately contact us, at any time, without hesitation. In any case, we plan to contact the patient by telephone for a follow-up status report regarding this interventional procedure.  Comments:  No additional relevant information.  Plan of Care  Orders:  No orders of the defined types were placed in this encounter.  Medications ordered for procedure: Meds ordered this encounter  Medications  . Sodium Hyaluronate SOSY 2 mL  . lidocaine (PF) (XYLOCAINE) 1 % injection 5 mL   Medications administered: We administered Sodium Hyaluronate and lidocaine (PF).  See the medical record for exact dosing, route, and time of administration.  Follow-up plan:   Return for Keep sch. appt.       Status post left genicular RFA 04/16/2018, status post right genicular RFA 03/12/2018.  Provided her with benefit.  Status post left knee intra-articular steroid injection #3 on 11/21/2018.   hyalgan left knee #1 on 04/17/19, #2 05/22/19, #3 07/03/2019, #4 08/28/19, #5 on 09/25/19      Recent Visits Date Type Provider Dept  08/28/19 Procedure visit Gillis Santa, MD [facility]  08/12/19 Telemedicine Gillis Santa, MD [facility]  07/03/19 Procedure visit Gillis Santa, MD [facility]  Showing recent visits within past 90 days and meeting all other requirements Today's Visits Date Type Provider Dept  09/25/19 Procedure visit Gillis Santa, MD [facility]  Showing today's visits and meeting all other requirements Future Appointments Date Type Provider Dept  11/07/19 Appointment Gillis Santa, MD [facility]  Showing future appointments within next 90 days and meeting all other requirements  Disposition: Discharge home  Discharge Date & Time: 09/25/2019; 1013 hrs.   Primary Care Physician: [name], FNP Location: [facility] Out[facility] Note by: Gillis Santa, MD Date: 09/25/2019; Time: 10:26 AM  Disclaimer:  Medicine is not an exact science. The only guarantee in medicine is that nothing is guaranteed. It is important to note that the decision to proceed with this intervention was based on the information collected from the patient. The Data and conclusions were drawn from the patient's questionnaire, the interview, and the physical examination. Because the information was provided in large part by the patient, it cannot be guaranteed that it has not been purposely or unconsciously manipulated. Every effort has been made to obtain as much relevant data as possible for this evaluation. It is important to note that the conclusions that lead to this procedure are derived in large part from the available data. Always take into account  that the treatment will also be dependent on  availability of resources and existing treatment guidelines, considered by other Pain Management Practitioners as being common knowledge and practice, at the time of the intervention. For Medico-Legal purposes, it is also important to point out that variation in procedural techniques and pharmacological choices are the acceptable norm. The indications, contraindications, technique, and results of the above procedure should only be interpreted and judged by a Board-Certified Interventional Pain Specialist with extensive familiarity and expertise in the same exact procedure and technique.

## 2019-09-25 NOTE — Patient Instructions (Signed)

## 2019-09-25 NOTE — Progress Notes (Signed)
Safety precautions to be maintained throughout the outpatient stay will include: orient to surroundings, keep bed in low position, maintain call bell within reach at all times, provide assistance with transfer out of bed and ambulation.  

## 2019-09-26 ENCOUNTER — Ambulatory Visit: Payer: Self-pay | Admitting: *Deleted

## 2019-09-26 NOTE — Telephone Encounter (Signed)
The pt was notified of recommendation, no question or concern.

## 2019-09-26 NOTE — Telephone Encounter (Signed)
Please see attach message: FYI: It looks as if the patient reported several times that she was not taking the verapamil because it caused dizziness.

## 2019-09-26 NOTE — Telephone Encounter (Signed)
Should not be taking the Verapamil.  Appears Dr. Raliegh Ip had sent in that Rx a few months ago and it had refills.  Not to continue that medication or the Lisinopril.  To begin taking the Lisinopril-Hydrochlorothiazide.

## 2019-09-26 NOTE — Telephone Encounter (Signed)
Per initital encounter, "Pt was given two bp meds and would like to know if she should be taking both. Please advise"; contacted pt says, and she says she picked up the [place]; she also picked up Verapamil; she would like know if she should be taking Verapmil also; per Niciole [name]'s noted dated 09/24/19, " BEGIN Taking Lisinopril-HCTZ 20-12.5mg  beginning tomorrow.  STOP lisinopril 20mg  daily"; Verapamil is not on the pt's medication list, but there are no instructions for the pt to stop the medication; the t would like to know if she should be taking both medications; she would like a callback regarding this; the pt can be contacted at [phone]; she sees Cyndia Skeeters, [place]; ill route to office for final disposition  Reason for Disposition  [1] Caller has URGENT medication question about med that PCP or specialist prescribed AND [2] triager unable to answer question  Answer Assessment - Initial Assessment Questions 1.   NAME of MEDICATION: "What medicine are you calling about?"     Verapamil 2.   QUESTION: "What is your question?"    Should she be taking this along with Lisinopril-HCTZ  3.   PRESCRIBING HCP: "Who prescribed it?" Reason: if prescribed by specialist, call should be referred to that group.    [place] 4. SYMPTOMS: "Do you have any symptoms?"      5. SEVERITY: If symptoms are present, ask "Are they mild, moderate or severe?"     6.  PREGNANCY:  "Is there any chance that you are pregnant?" "When was your last menstrual period?"  Protocols used: MEDICATION QUESTION CALL-A-AH

## 2019-10-02 ENCOUNTER — Telehealth: Payer: Self-pay

## 2019-10-02 ENCOUNTER — Ambulatory Visit: Payer: Self-pay | Admitting: Pharmacist

## 2019-10-02 NOTE — Chronic Care Management (AMB) (Signed)
  Chronic Care Management   Follow Up Note   10/02/2019 Name: [name] MRN: [id number] DOB: 1950-08-31  Referred by: [name], FNP Reason for referral : Chronic Care Management (Patient Phone Call)   Pamela Romero is a 69 y.o. year old female who is a primary care patient of [name], FNP. The CCM team was consulted for assistance with chronic disease management and care coordination needs.    Was unable to reach patient via telephone today and unable to leave a message as no voicemail picks up.   Plan  The care management team will reach out to the patient again over the next 30 days.   Harlow Asa, PharmD, [place] [employer] [phone]

## 2019-10-07 ENCOUNTER — Telehealth: Payer: Self-pay

## 2019-10-07 ENCOUNTER — Ambulatory Visit: Payer: Self-pay | Admitting: General Practice

## 2019-10-07 NOTE — Chronic Care Management (AMB) (Signed)
  Chronic Care Management   Outreach Note  10/07/2019 Name: ADILENNE ASHWORTH MRN: [id number] DOB: 1950-09-15  Referred by: [name], FNP Reason for referral : Chronic Care Management (Follow up: RNCM Chronic Disease Management and Care Coordination Needs)   An unsuccessful telephone outreach was attempted today. The patient was referred to the case management team for assistance with care management and care coordination.   Follow Up Plan: A HIPPA compliant phone message was left for the patient providing contact information and requesting a return call.   Noreene Larsson RN, MSN, [facility] Mobile: [phone]

## 2019-10-09 ENCOUNTER — Telehealth: Payer: Self-pay

## 2019-10-15 ENCOUNTER — Ambulatory Visit: Payer: Medicare Other | Attending: Otolaryngology

## 2019-10-15 ENCOUNTER — Ambulatory Visit: Payer: Self-pay | Admitting: Family Medicine

## 2019-10-21 ENCOUNTER — Ambulatory Visit (INDEPENDENT_AMBULATORY_CARE_PROVIDER_SITE_OTHER): Payer: Medicare Other | Admitting: General Practice

## 2019-10-21 ENCOUNTER — Telehealth: Payer: Medicare Other | Admitting: General Practice

## 2019-10-21 DIAGNOSIS — E785 Hyperlipidemia, unspecified: Secondary | ICD-10-CM

## 2019-10-21 DIAGNOSIS — I10 Essential (primary) hypertension: Secondary | ICD-10-CM

## 2019-10-21 DIAGNOSIS — M545 Low back pain, unspecified: Secondary | ICD-10-CM

## 2019-10-21 DIAGNOSIS — E890 Postprocedural hypothyroidism: Secondary | ICD-10-CM

## 2019-10-21 DIAGNOSIS — J441 Chronic obstructive pulmonary disease with (acute) exacerbation: Secondary | ICD-10-CM

## 2019-10-21 DIAGNOSIS — K5900 Constipation, unspecified: Secondary | ICD-10-CM

## 2019-10-21 NOTE — Patient Instructions (Signed)
Visit Information  Goals Addressed              This Visit's Progress     RNCM: PT- "I have not been taking my medication like I should" I need to do better.        CARE PLAN ENTRY (see longtitudinal plan of care for additional care plan information)  Current Barriers:   Chronic Disease Management support, education, and care coordination needs related to HTN, COPD, and Hypothyroidism   Financial Barriers   Clinical Goal(s) related to HTN, COPD, and Hypothyroidism :  Over the next 120 days, patient will:   Work with the care management team to address educational, disease management, and care coordination needs   Begin or continue self health monitoring activities as directed today Measure and record blood pressure 5 times per week and adhere to a Heart Healthy Diet  Call provider office for new or worsened signs and symptoms Blood pressure findings outside established parameters and New or worsened symptom related to COPD or Hypothyroidism   Call care management team with questions or concerns  Verbalize basic understanding of patient centered plan of care established today  Interventions related to HTN, COPD, and Hypothyroidism  :   Evaluation of current treatment plans and patient's adherence to plan as established by provider: Pt saw pcp recently and was evaluated for concerns. The patient has not always been taking her medications as prescribed. The patient is committed to changing her habits and getting on track with her health and well being.  The patient endorses doing better with taking her medications and also is happy to report her blood pressure is better. Still on the high side but it is coming down with the new medication regimen. The patient is experiencing dizziness at times but it is better. Education and support given. Her specialist is working on adjusting her thyroid medication as this has been a current and on going problem for the patient.  10-21-2019: The  patient is checking her blood pressure BID. This am it was 128/76.  She is pleased with the positive changes she is seeing. She did have a hypotensive pressure in the 70's one day. Experienced some dizziness. Education about orthostatic hypotension and being safe with changing position slowly when she is having light headedness or dizziness. She will see the pcp again soon. She is thankful that the pcp and CCM team are working with her to help her with her health and well being.   Assessed patient understanding of disease states- education and support  Assessed patient's education and care coordination needs- the patient is receptive to the CCM team working with her to help manage her health and well being.    The patient also wants recommendations from the pcp about a "coopertone belt" she has purchased for her back. She ordered the wrong size and is having to send that one back and get another one. She is wanting something that will help with her back because it is weak where she has not been exercising like she should.  Will collaborate with pcp and ask for recommendations. 10-21-2019: the patient is using the "belt" and feels like it is helping with her chronic back pain. She wants to talk to the pcp more about this also. She feels if she loses weight this will help also. She is working on dietary changes and improving her overall health and well being.   Provided disease specific education to patient - education on taking medications as  prescribed, keeping a record of blood pressure readings, adherence to a Heart Healthy diet  Assessed the patient mental health. The patient is so tired of being in her home but is excited about an upcoming trip to [place] to surprise her aunt. She feels like it will be good for her to get out of her house and [place]. 10-21-2019: the patient had a good time on her trip to [place]. The patient feels that more socialization will do her a lot of good. She went and spent time  with friends on the 4th of July and had a great time. She said being together with friends has been very good for her. She wants to find a church that she likes also. Information provided about the [facility] and activities they have there that may help her with socialization. The patient may look into this. The patient feels so much better when she is able to get out and do things.    Collaborated with appropriate clinical care team members regarding patient needs: Pharmacy referral for help with medication cost constraints and LCSW for help with stress and anxiety  Patient Self Care Activities related to HTN, COPD, and Hypothyroidism  :   Patient is unable to independently self-manage chronic health conditions  Please see past updates related to this goal by clicking on the "Past Updates" button in the selected goal        RNCM: pt-"I am having a time with constipation" (pt-stated)        CARE PLAN ENTRY (see longitudinal plan of care for additional care plan information)  Current Barriers:   Knowledge Deficits related to chronic constipation after cancer surgery several years ago  Care Coordination needs related to worsening constipation in a patient with Chronic constipation (disease states)  Chronic Disease Management support and education needs related to chronic constipation after cancer surgery   Nurse Case Manager Clinical Goal(s):   Over the next 120 days, patient will verbalize understanding of plan for managing chronic constipation   Over the next 120 days, patient will work with [facility] and pcp to address needs related to chronic constipation  Over the next 120 days, patient will demonstrate a decrease in constipation exacerbations as evidenced by finding a bowel habit ritual that works well with her system  Over the next 120 days, patient will attend all scheduled medical appointments: saw pcp on 09-24-2019 and upcoming appointment on 10-28-2019  Over the next  120 days, the patient will demonstrate ongoing self health care management ability as evidenced by managed constipation  Interventions:   Inter-disciplinary care team collaboration (see longitudinal plan of care)  Advised patient to talk to the pcp about her chronic constipation and recommendations  Provided education to patient re: alternative means for relief of constipation. The patient was not having an issue until after her cancer surgery. The powder does not work as well. The patient states that she feels the constipation is getting worse.   Collaborated with pcp regarding the patients chronic constipation issues  Discussed plans with patient for ongoing care management follow up and provided patient with direct contact information for care management team  Provided patient with alternatives for healthy bowel habits educational materials related to chronic constipation  Reviewed scheduled/upcoming provider appointments including: Next appointment with pcp on 10-28-2019. The patient plans to discuss her concerns with the pcp. The patient does ask for help with OTC laxative or recommendations from pcp or pharmacist to promote healthy bowel habits. She has  a hard time and the "powder" is not working well.  Will collaborate with the pcp and pharmacist.  Patient Self Care Activities:   Patient verbalizes understanding of plan to work with [facility], CCM team and pcp to help with chronic constipation needs  Self administers medications as prescribed  Attends all scheduled provider appointments  Calls provider office for new concerns or questions  Unable to independently manage chronic constipation  Initial goal documentation        Patient verbalizes understanding of instructions provided today.   The care management team will reach out to the patient again over the next 60 to 90 days.   Noreene Larsson RN, MSN, [place] Mobile: [phone]

## 2019-10-21 NOTE — Chronic Care Management (AMB) (Signed)
Chronic Care Management   Follow Up Note   10/21/2019 Name: Pamela Romero MRN: [id number] DOB: Aug 31, 1950  Referred by: [name], FNP Reason for referral : Chronic Care Management (RNCM Follow up: Chronic Disease Management and Care Coordination Needs)   Pamela Romero is a 69 y.o. year old female who is a primary care patient of [name], FNP. The CCM team was consulted for assistance with chronic disease management and care coordination needs.    Review of patient status, including review of consultants reports, relevant laboratory and other test results, and collaboration with appropriate care team members and the patient's provider was performed as part of comprehensive patient evaluation and provision of chronic care management services.    SDOH (Social Determinants of Health) assessments performed: Yes See Care Plan activities for detailed interventions related to [facility])     Outpatient Encounter Medications as of 10/21/2019  Medication Sig  . Budeson-Glycopyrrol-Formoterol (BREZTRI AEROSPHERE) 160-9-4.8 MCG/ACT AERO Inhale 2 puffs into the lungs 2 (two) times daily.  [name] docusate sodium (COLACE) 100 MG capsule Take 100 mg by mouth 2 (two) times daily as needed.  . ergocalciferol (VITAMIN D2) 1.25 MG (50000 UT) capsule Take by mouth.  . esomeprazole (NEXIUM) 40 MG capsule Take 1 capsule (40 mg total) by mouth daily.  [name] HYDROcodone-acetaminophen (NORCO) 7.5-325 MG tablet Take 1 tablet by mouth every 6 (six) hours as needed for severe pain. Must last 30 days.  [name] HYDROcodone-acetaminophen (NORCO) 7.5-325 MG tablet Take 1 tablet by mouth every 6 (six) hours as needed for severe pain. Must last 30 days.  [name] levothyroxine (SYNTHROID) 112 MCG tablet Take 1 tablet (112 mcg total) by mouth daily.  [name] lisinopril-hydrochlorothiazide (ZESTORETIC) 20-12.5 MG tablet Take 1 tablet by mouth daily. (Patient not taking: Reported on 09/25/2019)  . montelukast (SINGULAIR) 10 MG tablet Take  1 tablet (10 mg total) by mouth at bedtime.  . ondansetron (ZOFRAN ODT) 4 MG disintegrating tablet Take 1 tablet (4 mg total) by mouth every 8 (eight) hours as needed for nausea or vomiting.  . polyethylene glycol powder (GLYCOLAX/MIRALAX) 17 GM/SCOOP powder Take 17 g by mouth daily as needed for mild constipation or moderate constipation.  [name] PROAIR HFA 108 (90 Base) MCG/ACT inhaler INHALE 2 PUFFS INTO THE LUNGS EVERY 4 HOURS AS NEEDED FOR WHEEZING OR SHORTNESS OF BREATH (COUGH  . sertraline (ZOLOFT) 25 MG tablet Take by mouth.  . SUMAtriptan (IMITREX) 50 MG tablet Take 1 tablet (50 mg total) by mouth once as needed for up to 1 dose for migraine. May repeat one dose in 2 hours if headache persists, for max dose 24 hours   No facility-administered encounter medications on file as of 10/21/2019.     Objective:   BP Readings from Last 3 Encounters:  10/21/19 128/76  09/25/19 (!) 146/103  09/24/19 (!) 142/98   Goals Addressed              This Visit's Progress   .  RNCM: PT- "I have not been taking my medication like I should" I need to do better.        CARE PLAN ENTRY (see longtitudinal plan of care for additional care plan information)  Current Barriers:  . Chronic Disease Management support, education, and care coordination needs related to HTN, COPD, and Hypothyroidism  . Financial Barriers   Clinical Goal(s) related to HTN, COPD, and Hypothyroidism :  Over the next 120 days, patient will:  . Work with the care  management team to address educational, disease management, and care coordination needs  . Begin or continue self health monitoring activities as directed today Measure and record blood pressure 5 times per week and adhere to a Heart Healthy Diet . Call provider office for new or worsened signs and symptoms Blood pressure findings outside established parameters and New or worsened symptom related to COPD or Hypothyroidism  . Call care management team with questions or  concerns . Verbalize basic understanding of patient centered plan of care established today  Interventions related to HTN, COPD, and Hypothyroidism  :  . Evaluation of current treatment plans and patient's adherence to plan as established by provider: Pt saw pcp recently and was evaluated for concerns. The patient has not always been taking her medications as prescribed. The patient is committed to changing her habits and getting on track with her health and well being.  The patient endorses doing better with taking her medications and also is happy to report her blood pressure is better. Still on the high side but it is coming down with the new medication regimen. The patient is experiencing dizziness at times but it is better. Education and support given. Her specialist is working on adjusting her thyroid medication as this has been a current and on going problem for the patient.  10-21-2019: The patient is checking her blood pressure BID. This am it was 128/76.  She is pleased with the positive changes she is seeing. She did have a hypotensive pressure in the 70's one day. Experienced some dizziness. Education about orthostatic hypotension and being safe with changing position slowly when she is having light headedness or dizziness. She will see the pcp again soon. She is thankful that the pcp and CCM team are working with her to help her with her health and well being.  . Assessed patient understanding of disease states- education and support . Assessed patient's education and care coordination needs- the patient is receptive to the CCM team working with her to help manage her health and well being.   . The patient also wants recommendations from the pcp about a "coopertone belt" she has purchased for her back. She ordered the wrong size and is having to send that one back and get another one. She is wanting something that will help with her back because it is weak where she has not been exercising like  she should.  Will collaborate with pcp and ask for recommendations. 10-21-2019: the patient is using the "belt" and feels like it is helping with her chronic back pain. She wants to talk to the pcp more about this also. She feels if she loses weight this will help also. She is working on dietary changes and improving her overall health and well being.  . Provided disease specific education to patient - education on taking medications as prescribed, keeping a record of blood pressure readings, adherence to a Heart Healthy diet . Assessed the patient mental health. The patient is so tired of being in her home but is excited about an upcoming trip to [place] to surprise her aunt. She feels like it will be good for her to get out of her house and [place]. 10-21-2019: the patient had a good time on her trip to [place]. The patient feels that more socialization will do her a lot of good. She went and spent time with friends on the 4th of July and had a great time. She said being together with friends has been  very good for her. She wants to find a church that she likes also. Information provided about the [facility] and activities they have there that may help her with socialization. The patient may look into this. The patient feels so much better when she is able to get out and do things.   [name] with appropriate clinical care team members regarding patient needs: Pharmacy referral for help with medication cost constraints and LCSW for help with stress and anxiety  Patient Self Care Activities related to HTN, COPD, and Hypothyroidism  :  . Patient is unable to independently self-manage chronic health conditions  Please see past updates related to this goal by clicking on the "Past Updates" button in the selected goal      .  RNCM: pt-"I am having a time with constipation" (pt-stated)        CARE PLAN ENTRY (see longitudinal plan of care for additional care plan information)  Current Barriers:   [name] Knowledge Deficits related to chronic constipation after cancer surgery several years ago . Care Coordination needs related to worsening constipation in a patient with Chronic constipation (disease states) . Chronic Disease Management support and education needs related to chronic constipation after cancer surgery   Nurse Case Manager Clinical Goal(s):  [name] Over the next 120 days, patient will verbalize understanding of plan for managing chronic constipation  . Over the next 120 days, patient will work with [facility] and pcp to address needs related to chronic constipation . Over the next 120 days, patient will demonstrate a decrease in constipation exacerbations as evidenced by finding a bowel habit ritual that works well with her system . Over the next 120 days, patient will attend all scheduled medical appointments: saw pcp on 09-24-2019 and upcoming appointment on 10-28-2019 . Over the next 120 days, the patient will demonstrate ongoing self health care management ability as evidenced by managed constipation  Interventions:  . Inter-disciplinary care team collaboration (see longitudinal plan of care) . Advised patient to talk to the pcp about her chronic constipation and recommendations . Provided education to patient re: alternative means for relief of constipation. The patient was not having an issue until after her cancer surgery. The powder does not work as well. The patient states that she feels the constipation is getting worse.  [name] Collaborated with pcp regarding the patients chronic constipation issues . Discussed plans with patient for ongoing care management follow up and provided patient with direct contact information for care management team . Provided patient with alternatives for healthy bowel habits educational materials related to chronic constipation . Reviewed scheduled/upcoming provider appointments including: Next appointment with pcp on 10-28-2019. The patient plans to discuss  her concerns with the pcp. The patient does ask for help with OTC laxative or recommendations from pcp or pharmacist to promote healthy bowel habits. She has a hard time and the "powder" is not working well.  Will collaborate with the pcp and pharmacist.  Patient Self Care Activities:  . Patient verbalizes understanding of plan to work with [facility], CCM team and pcp to help with chronic constipation needs . Self administers medications as prescribed . Attends all scheduled provider appointments . Calls provider office for new concerns or questions . Unable to independently manage chronic constipation  Initial goal documentation         Plan:   The care management team will reach out to the patient again over the next 60 to 90 days.    Noreene Larsson  RN, MSN, [place] Mobile: [phone]

## 2019-10-24 ENCOUNTER — Telehealth: Payer: Self-pay

## 2019-10-28 ENCOUNTER — Ambulatory Visit (INDEPENDENT_AMBULATORY_CARE_PROVIDER_SITE_OTHER): Payer: Medicare Other | Admitting: Family Medicine

## 2019-10-28 ENCOUNTER — Other Ambulatory Visit: Payer: Self-pay

## 2019-10-28 ENCOUNTER — Telehealth: Payer: Self-pay

## 2019-10-28 ENCOUNTER — Encounter: Payer: Self-pay | Admitting: Family Medicine

## 2019-10-28 VITALS — BP 118/86 | HR 87 | Temp 97.1°F | Resp 18 | Ht 68.0 in | Wt 233.0 lb

## 2019-10-28 DIAGNOSIS — I10 Essential (primary) hypertension: Secondary | ICD-10-CM

## 2019-10-28 DIAGNOSIS — R5382 Chronic fatigue, unspecified: Secondary | ICD-10-CM | POA: Diagnosis not present

## 2019-10-28 DIAGNOSIS — K5900 Constipation, unspecified: Secondary | ICD-10-CM

## 2019-10-28 DIAGNOSIS — E890 Postprocedural hypothyroidism: Secondary | ICD-10-CM

## 2019-10-28 DIAGNOSIS — K2100 Gastro-esophageal reflux disease with esophagitis, without bleeding: Secondary | ICD-10-CM

## 2019-10-28 MED ORDER — SENNOSIDES-DOCUSATE SODIUM 8.6-50 MG PO TABS: 1.0000 | ORAL_TABLET | Freq: Two times a day (BID) | ORAL | 1 refills | Status: DC | PRN

## 2019-10-28 MED ORDER — ESOMEPRAZOLE MAGNESIUM 40 MG PO CPDR: 40.0000 mg | DELAYED_RELEASE_CAPSULE | Freq: Two times a day (BID) | ORAL | 3 refills | Status: DC

## 2019-10-28 MED ORDER — DOCUSATE SODIUM 100 MG PO CAPS: 100.0000 mg | ORAL_CAPSULE | Freq: Two times a day (BID) | ORAL | 1 refills | Status: DC | PRN

## 2019-10-28 MED ORDER — ESOMEPRAZOLE MAGNESIUM 40 MG PO CPDR: 40.0000 mg | DELAYED_RELEASE_CAPSULE | Freq: Every day | ORAL | 3 refills | Status: DC

## 2019-10-28 NOTE — Assessment & Plan Note (Signed)
Discussed sleep study that was scheduled for 10/16/2019 with concerns for possible OSA.  Patient cancelled that sleep study due to not feeling well.  Encouraged to reschedule, new order placed today for sleep study.  Plan: 1. Sleep study ordered 2. F/U after sleep study completed

## 2019-10-28 NOTE — Assessment & Plan Note (Signed)
Currently well controlled on Esomeprazole 40mg  once daily.  Reports takes when needs it and not daily.  Educated importance of taking daily for adequate control of symptoms.  Plan: 1. Continue Esomeprazole 40mg  once daily. Side effects discussed. Pt wants to continue med. 2. Avoid diet triggers. Reviewed need to seek care if globus sensation, difficulty swallowing, s/sx of GI bleed. 3. Follow up as needed and in 3 months.

## 2019-10-28 NOTE — Assessment & Plan Note (Addendum)
History of constipation that patient has taken colace and mirilax in the past without relief of symptoms.  Discussed can try Senna twice daily, increase her water intake to 100oz per day and increase vegetable intake to 8 servings per day.  Also discussed, her pain medication, Norco, can be contributing to her constipation as well.  Will re-evaluate in 3 months and if still having symptoms, will refer to GI for re-evaluation.  Plan: 1. Begin senna twice daily 2. Increase water and vegetable intake 3. RTC in 3 months

## 2019-10-28 NOTE — Assessment & Plan Note (Signed)
Controlled hypertension.  Pt is not working on lifestyle modifications.  Taking medications tolerating well without side effects. Complications: obesity, GERD, hypothyroidism   Plan: 1. Continue taking lisinopril-hydrochlorothiazide 20-12.5mg  daily 2. Obtain labs after next visit  3. Encouraged heart healthy diet and increasing exercise to 30 minutes most days of the week, going no more than 2 days in a row without exercise. 4. Check BP 1-2 x per week at home, keep log, and bring to clinic at next appointment. 5. Follow up 3 months.

## 2019-10-28 NOTE — Assessment & Plan Note (Signed)
Has continued to take levothyroxine 16mcg 1 hour before eating/drinking in the morning.  Will have labs re-checked today for re-evaluation.  Discussed, based on results, will send in new Rx for levothyroxine and possible new dosage, tomorrow.  Patient verbalized understanding.  Plan: 1. Have labs drawn today 2. Will contact once labs are resulted to discuss dosing of levothyroxine and if additional labs will be needed

## 2019-10-28 NOTE — Progress Notes (Signed)
Subjective:    Patient ID: Pamela Romero, female    DOB: [birth year], 69 y.o.   MRN: [id number]  OCTA UPLINGER is a 70 y.o. female presenting on 10/28/2019 for Hypothyroidism, Constipation (intermittent constipation x 6 mths. Pt currently taking the stool softner without much relief. ), and Hypertension   HPI  Pamela Romero presents to clinic for follow up labs for her thyroid, constipation and hypertension.  Hypertension - She is not checking BP at home or outside of clinic.    - Current medications: lisinopril-hydrochlorothiazide 20-12.5mg  daily, tolerating well without side effects - She is not currently symptomatic. - Pt denies headache, lightheadedness, dizziness, changes in vision, chest tightness/pressure, palpitations, leg swelling, sudden loss of speech or loss of consciousness. - She  reports no regular exercise routine. - Her diet is high in salt, high in fat, and high in carbohydrates.  Pamela Romero presents she has been having continued constipation.  Has taken colace and mirilax in the past without improvement of symptoms.  Finds that she is straining with attempting to have a bowel movement and causing nausea and sometimes vomiting from straining.  Denies blood on toilet tissue or in toilet with bowel movements.  Depression screen [facility] 2/9 09/25/2019 09/24/2019 08/28/2019  Decreased Interest 0 3 0  Down, Depressed, Hopeless 0 3 0  PHQ - 2 Score 0 6 0  Altered sleeping - 3 -  Tired, decreased energy - 3 -  Change in appetite - 1 -  Feeling bad or failure about yourself  - 1 -  Trouble concentrating - 1 -  Moving slowly or fidgety/restless - 0 -  Suicidal thoughts - 0 -  PHQ-9 Score - 15 -  Difficult doing work/chores - Somewhat difficult -  Some recent data might be hidden    Social History   Tobacco Use  . Smoking status: Former Smoker    Packs/day: 0.50    Years: 50.00    Pack years: 25.00    Quit date: 05/11/2018    Years since quitting: 1.4  . Smokeless  tobacco: Former Network engineer  . Vaping Use: Never used  Substance Use Topics  . Alcohol use: No  . Drug use: No    Review of Systems  Constitutional: Negative.   HENT: Negative.   Eyes: Negative.   Respiratory: Negative.   Cardiovascular: Negative.   Gastrointestinal: Positive for constipation, nausea and vomiting. Negative for abdominal distention, abdominal pain, anal bleeding, blood in stool, diarrhea and rectal pain.  Endocrine: Negative.   Genitourinary: Negative.   Musculoskeletal: Negative.   Skin: Negative.   Allergic/Immunologic: Negative.   Neurological: Negative.   Hematological: Negative.   Psychiatric/Behavioral: Negative.    Per HPI unless specifically indicated above     Objective:    BP 118/86 (BP Location: Left Arm, Patient Position: Sitting, Cuff Size: Normal)   Pulse 87   Temp (!) 97.1 F (36.2 C) (Temporal)   Resp 18   Ht 5\' 8"  (1.727 m)   Wt 233 lb (105.7 kg)   SpO2 97%   BMI 35.43 kg/m   Wt Readings from Last 3 Encounters:  10/28/19 233 lb (105.7 kg)  09/25/19 210 lb (95.3 kg)  09/24/19 237 lb (107.5 kg)    Physical Exam Vitals reviewed.  Constitutional:      General: She is not in acute distress.    Appearance: Normal appearance. She is well-developed and well-groomed. She is obese. She is not ill-appearing or toxic-appearing.  HENT:  Head: Normocephalic and atraumatic.     Nose:     Comments: Lizbeth Bark is in place, covering mouth and nose. Eyes:     General: Lids are normal. Vision grossly intact.        Right eye: No discharge.        Left eye: No discharge.     Extraocular Movements: Extraocular movements intact.     Conjunctiva/sclera: Conjunctivae normal.     Pupils: Pupils are equal, round, and reactive to light.  Cardiovascular:     Rate and Rhythm: Normal rate and regular rhythm.     Pulses: Normal pulses.          Dorsalis pedis pulses are 2+ on the right side and 2+ on the left side.     Heart sounds: Normal heart  sounds. No murmur heard.  No friction rub. No gallop.   Pulmonary:     Effort: Pulmonary effort is normal. No respiratory distress.     Breath sounds: Normal breath sounds.  Abdominal:     General: Bowel sounds are normal. There is no distension.     Palpations: Abdomen is soft. There is no mass.     Tenderness: There is no abdominal tenderness. There is no guarding or rebound.  Musculoskeletal:     Right lower leg: No edema.     Left lower leg: No edema.  Skin:    General: Skin is warm and dry.     Capillary Refill: Capillary refill takes less than 2 seconds.  Neurological:     General: No focal deficit present.     Mental Status: She is alert and oriented to person, place, and time.  Psychiatric:        Attention and Perception: Attention and perception normal.        Mood and Affect: Mood and affect normal.        Speech: Speech normal.        Behavior: Behavior normal. Behavior is cooperative.        Thought Content: Thought content normal.        Cognition and Memory: Cognition and memory normal.        Judgment: Judgment normal.    Results for orders placed or performed in visit on 08/30/19  CBC with Differential  Result Value Ref Range   WBC 4.3 3.8 - 10.8 Thousand/uL   RBC 3.60 (L) 3.80 - 5.10 Million/uL   Hemoglobin 11.9 11.7 - 15.5 g/dL   HCT 36.2 35 - 45 %   MCV 100.6 (H) 80.0 - 100.0 fL   MCH 33.1 (H) 27.0 - 33.0 pg   MCHC 32.9 32.0 - 36.0 g/dL   RDW 12.4 11.0 - 15.0 %   Platelets 129 (L) 140 - 400 Thousand/uL   MPV 10.8 7.5 - 12.5 fL   Neutro Abs 2,941 1,500 - 7,800 cells/uL   Lymphs Abs 976 850 - 3,900 cells/uL   Absolute Monocytes 310 200 - 950 cells/uL   Eosinophils Absolute 52 15 - 500 cells/uL   Basophils Absolute 22 0 - 200 cells/uL   Neutrophils Relative % 68.4 %   Total Lymphocyte 22.7 %   Monocytes Relative 7.2 %   Eosinophils Relative 1.2 %   Basophils Relative 0.5 %   Smear Review    COMPLETE METABOLIC PANEL WITH GFR  Result Value Ref Range    Glucose, Bld 89 65 - 99 mg/dL   BUN 14 7 - 25 mg/dL   Creat 0.87 0.50 - 0.99  mg/dL   GFR, Est Non African American 69 > OR = 60 mL/min/1.30m2   GFR, Est African American 80 > OR = 60 mL/min/1.71m2   BUN/Creatinine Ratio NOT APPLICABLE 6 - 22 (calc)   Sodium 141 135 - 146 mmol/L   Potassium 4.4 3.5 - 5.3 mmol/L   Chloride 105 98 - 110 mmol/L   CO2 29 20 - 32 mmol/L   Calcium 9.0 8.6 - 10.4 mg/dL   Total Protein 5.8 (L) 6.1 - 8.1 g/dL   Albumin 3.6 3.6 - 5.1 g/dL   Globulin 2.2 1.9 - 3.7 g/dL (calc)   AG Ratio 1.6 1.0 - 2.5 (calc)   Total Bilirubin 0.5 0.2 - 1.2 mg/dL   Alkaline phosphatase (APISO) 56 37 - 153 U/L   AST 17 10 - 35 U/L   ALT 6 6 - 29 U/L  Lipid Profile  Result Value Ref Range   Cholesterol 186 <200 mg/dL   HDL 43 (L) > OR = 50 mg/dL   Triglycerides 132 <150 mg/dL   LDL Cholesterol (Calc) 118 (H) mg/dL (calc)   Total CHOL/HDL Ratio 4.3 <5.0 (calc)   Non-HDL Cholesterol (Calc) 143 (H) <130 mg/dL (calc)  Thyroid Panel With TSH  Result Value Ref Range   T3 Uptake 33 22 - 35 %   T4, Total 18.7 (H) 5.1 - 11.9 mcg/dL   Free Thyroxine Index 6.2 (H) 1.4 - 3.8   TSH 0.66 0.40 - 4.50 mIU/L      Assessment & Plan:   Problem List Items Addressed This Visit      Cardiovascular and Mediastinum   Essential hypertension    Controlled hypertension.  Pt is not working on lifestyle modifications.  Taking medications tolerating well without side effects. Complications: obesity, GERD, hypothyroidism   Plan: 1. Continue taking lisinopril-hydrochlorothiazide 20-12.5mg  daily 2. Obtain labs after next visit  3. Encouraged heart healthy diet and increasing exercise to 30 minutes most days of the week, going no more than 2 days in a row without exercise. 4. Check BP 1-2 x per week at home, keep log, and bring to clinic at next appointment. 5. Follow up 3 months.           Digestive   Esophageal reflux    Currently well controlled on Esomeprazole 40mg  once daily.  Reports  takes when needs it and not daily.  Educated importance of taking daily for adequate control of symptoms.  Plan: 1. Continue Esomeprazole 40mg  once daily. Side effects discussed. Pt wants to continue med. 2. Avoid diet triggers. Reviewed need to seek care if globus sensation, difficulty swallowing, s/sx of GI bleed. 3. Follow up as needed and in 3 months.       Relevant Medications   docusate sodium (COLACE) 100 MG capsule   senna-docusate (SENNA-S) 8.6-50 MG tablet   esomeprazole (NEXIUM) 40 MG capsule     Endocrine   Hypothyroidism - Primary    Has continued to take levothyroxine 154mcg 1 hour before eating/drinking in the morning.  Will have labs re-checked today for re-evaluation.  Discussed, based on results, will send in new Rx for levothyroxine and possible new dosage, tomorrow.  Patient verbalized understanding.  Plan: 1. Have labs drawn today 2. Will contact once labs are resulted to discuss dosing of levothyroxine and if additional labs will be needed      Relevant Orders   Thyroid Panel With TSH     Other   Constipation    History of constipation that patient  has taken colace and mirilax in the past without relief of symptoms.  Discussed can try Senna twice daily, increase her water intake to 100oz per day and increase vegetable intake to 8 servings per day.  Also discussed, her pain medication, Norco, can be contributing to her constipation as well.  Will re-evaluate in 3 months and if still having symptoms, will refer to GI for re-evaluation.  Plan: 1. Begin senna twice daily 2. Increase water and vegetable intake 3. RTC in 3 months      Relevant Medications   docusate sodium (COLACE) 100 MG capsule   senna-docusate (SENNA-S) 8.6-50 MG tablet   Fatigue    Discussed sleep study that was scheduled for 10/16/2019 with concerns for possible OSA.  Patient cancelled that sleep study due to not feeling well.  Encouraged to reschedule, new order placed today for sleep  study.  Plan: 1. Sleep study ordered 2. F/U after sleep study completed      Relevant Orders   Nocturnal polysomnography      Meds ordered this encounter  Medications  . docusate sodium (COLACE) 100 MG capsule    Sig: Take 1 capsule (100 mg total) by mouth 2 (two) times daily as needed.    Dispense:  180 capsule    Refill:  1  . senna-docusate (SENNA-S) 8.6-50 MG tablet    Sig: Take 1 tablet by mouth 2 (two) times daily as needed for mild constipation.    Dispense:  60 tablet    Refill:  1  . DISCONTD: esomeprazole (NEXIUM) 40 MG capsule    Sig: Take 1 capsule (40 mg total) by mouth 2 (two) times daily before a meal.    Dispense:  60 capsule    Refill:  3  . esomeprazole (NEXIUM) 40 MG capsule    Sig: Take 1 capsule (40 mg total) by mouth daily.    Dispense:  60 capsule    Refill:  3      Follow up plan: Return in about 3 months (around 01/28/2020) for HTN F/U.   Harlin Rain, [place] [facility] Nurse Practitioner [facility] 10/28/2019, 11:19 AM

## 2019-10-28 NOTE — Patient Instructions (Addendum)
Have your labs drawn and we will contact you with the results  CONTINUE lisinopril/hydrochlorothiazide 20-12.5mg  mg once daily.    Some of the possible side effects are:   - angioedema: swelling of lips, mouth, and tongue.  If this rare side effect occurs, please go to ED.  - cough: you could develop a dry, hacking cough caused by this medicine.  If it occurs, it will go away after stopping this medicine.  Call the clinic before stopping the medication.  - kidney damage: we will monitor your labs when we start this medicine and at least one time per year.  If you do not have an change in kidney function when starting this medicine, it will provide kidney protection over time.  Try to get exercise a minimum of 30 minutes per day at least 5 days per week as well as  adequate water intake all while measuring blood pressure a few times per week.  Keep a blood pressure log and bring back to clinic at your next visit.  If your readings are consistently over 130/80 to contact our office/send me a MyChart message and we will see you sooner.  Can try DASH and Mediterranean diet options, avoiding processed foods, lowering sodium intake, avoiding pork products, and eating a plant based diet for optimal health.  I have sent in a prescription for Senna to take 1 tablet 2x per day as needed for constipation.  Increase your water intake to 100 ounces to help with constipation  Can try to increase your vegetable intake to 8 servings per day.  Fiber can help with constipation.  I have put in a new referral for sleep study.  You should hear from them within 1 week to schedule this.  If you haven't heard anything by next Monday, to let us know.  For Mammogram screening for breast cancer  And DEXA Scan (Bone mineral density) screening for osteoporosis  Call the [facility] below anytime to schedule your own appointment now that order has been placed.  [facility] [address], [place] [address] Phone: [phone]  [facility] [address] [place], [place] [address] Phone: [phone]  We will plan to see you back in 3 months for hypertension follow up visit  You will receive a survey after today's visit either digitally by e-mail or paper by Ebony mail. Your experiences and feedback matter to [place].  Please respond so we know how we are doing as we provide care for you.  Call us with any questions/concerns/needs.  It is my goal to be available to you for your health concerns.  Thanks for choosing me to be a partner in your healthcare needs!  Harlin Rain, FNP-C Family Nurse Practitioner [facility] Phone: [phone]

## 2019-10-29 ENCOUNTER — Other Ambulatory Visit: Payer: Self-pay | Admitting: Family Medicine

## 2019-10-29 DIAGNOSIS — E890 Postprocedural hypothyroidism: Secondary | ICD-10-CM

## 2019-10-29 LAB — THYROID PANEL WITH TSH
Free Thyroxine Index: 2.2 (ref 1.4–3.8)
T3 Uptake: 25 % (ref 22–35)
T4, Total: 8.9 ug/dL (ref 5.1–11.9)
TSH: 22.08 mIU/L — ABNORMAL HIGH (ref 0.40–4.50)

## 2019-10-29 MED ORDER — LEVOTHYROXINE SODIUM 125 MCG PO TABS: 125.0000 ug | ORAL_TABLET | Freq: Every day | ORAL | 0 refills | Status: DC

## 2019-11-07 ENCOUNTER — Other Ambulatory Visit: Payer: Self-pay

## 2019-11-07 ENCOUNTER — Encounter: Payer: Self-pay | Admitting: Student in an Organized Health Care Education/Training Program

## 2019-11-07 ENCOUNTER — Ambulatory Visit
Payer: Medicare Other | Attending: Student in an Organized Health Care Education/Training Program | Admitting: Student in an Organized Health Care Education/Training Program

## 2019-11-07 VITALS — BP 141/105 | HR 92 | Temp 97.0°F | Resp 16 | Ht 68.0 in | Wt 210.0 lb

## 2019-11-07 DIAGNOSIS — Z79891 Long term (current) use of opiate analgesic: Secondary | ICD-10-CM | POA: Diagnosis present

## 2019-11-07 DIAGNOSIS — M1712 Unilateral primary osteoarthritis, left knee: Secondary | ICD-10-CM | POA: Diagnosis present

## 2019-11-07 DIAGNOSIS — M25571 Pain in right ankle and joints of right foot: Secondary | ICD-10-CM | POA: Diagnosis present

## 2019-11-07 DIAGNOSIS — G8929 Other chronic pain: Secondary | ICD-10-CM | POA: Insufficient documentation

## 2019-11-07 DIAGNOSIS — M170 Bilateral primary osteoarthritis of knee: Secondary | ICD-10-CM | POA: Diagnosis present

## 2019-11-07 DIAGNOSIS — M19071 Primary osteoarthritis, right ankle and foot: Secondary | ICD-10-CM | POA: Diagnosis present

## 2019-11-07 DIAGNOSIS — G894 Chronic pain syndrome: Secondary | ICD-10-CM | POA: Diagnosis present

## 2019-11-07 MED ORDER — HYDROCODONE-ACETAMINOPHEN 7.5-325 MG PO TABS: 1.0000 | ORAL_TABLET | Freq: Four times a day (QID) | ORAL | 0 refills | Status: DC | PRN

## 2019-11-07 MED ORDER — HYDROCODONE-ACETAMINOPHEN 7.5-325 MG PO TABS: 1.0000 | ORAL_TABLET | Freq: Four times a day (QID) | ORAL | 0 refills | Status: AC | PRN

## 2019-11-07 NOTE — Progress Notes (Signed)
PROVIDER NOTE: Information contained herein reflects review and annotations entered in association with encounter. Interpretation of such information and data should be left to medically-trained personnel. Information provided to patient can be located elsewhere in the medical record under "Patient Instructions". Document created using STT-dictation technology, any transcriptional errors that may result from process are unintentional.    Patient: Pamela Romero  Service Category: E/M  Provider: Gillis Santa, MD  DOB: 01-04-1951  DOS: 11/07/2019  Specialty: Interventional Pain Management  MRN: [id number]  Setting: Ambulatory outpatient  PCP: [name], FNP  Type: Established Patient    Referring Provider: No ref. provider found  Location: Office  Delivery: Face-to-face     HPI  Reason for encounter: Pamela Romero, a 69 y.o. year old female, is here today for evaluation and management of her Primary osteoarthritis of left knee [M17.12]. Ms. Lanum primary complain today is Knee Pain (bilateral) Last encounter: Practice (09/25/2019). My last encounter with her was on 09/25/2019. Pertinent problems: Ms. Pewitt does not have any pertinent problems on file. Pain Assessment: Severity of Chronic pain is reported as a 7 /10. Location: Knee Right, Left/denies. Onset: More than a month ago. Quality: Stabbing. Timing: Constant. Modifying factor(s): positioning. Vitals:  height is 5' 8" (1.727 m) and weight is 210 lb (95.3 kg) (abnormal). Her temporal temperature is 97 F (36.1 C) (abnormal). Her blood pressure is 141/105 (abnormal) and her pulse is 92. Her respiration is 16 and oxygen saturation is 98%.    Post-Procedure Evaluation  Procedure (09/25/2019): Left knee hyalgan   Sedation: Please see nurses note.  Effectiveness during initial hour after procedure(Ultra-Short Term Relief): 100 %   Local anesthetic used: Long-acting (4-6 hours) Effectiveness: Defined as any analgesic benefit  obtained secondary to the administration of local anesthetics. This carries significant diagnostic value as to the etiological location, or anatomical origin, of the pain. Duration of benefit is expected to coincide with the duration of the local anesthetic used.  Effectiveness during initial 4-6 hours after procedure(Short-Term Relief): 100 %  Long-term benefit: Defined as any relief past the pharmacologic duration of the local anesthetics.  Effectiveness past the initial 6 hours after procedure(Long-Term Relief): 50 % (lasted 1 week)  Current benefits: Defined as benefit that persist at this time.   Analgesia:  Back to baseline Function: Back to baseline ROM: Back to baseline   Pharmacotherapy Assessment   Analgesic:Norco 7.5 mg TID #90/month MME = 22.5 Monitoring: [place] PMP: PDMP not reviewed this encounter.       Pharmacotherapy: No side-effects or adverse reactions reported. Compliance: No problems identified. Effectiveness: Clinically acceptable.  Landis Martins, RN  11/07/2019  8:31 AM  Sign when Signing Visit Nursing Pain Medication Assessment:  Safety precautions to be maintained throughout the outpatient stay will include: orient to surroundings, keep bed in low position, maintain call bell within reach at all times, provide assistance with transfer out of bed and ambulation.  Medication Inspection Compliance: Pill count conducted under aseptic conditions, in front of the patient. Neither the pills nor the bottle was removed from the patient's sight at any time. Once count was completed pills were immediately returned to the patient in their original bottle.  Medication: Hydrocodone/APAP Pill/Patch Count: 39 of 105 pills remain Pill/Patch Appearance: Markings consistent with prescribed medication Bottle Appearance: Standard pharmacy container. Clearly labeled. Filled Date: 10/21/2019 Last Medication intake:  Today    UDS:  Summary  Date Value Ref Range Status  09/03/2019  Note  Final  Comment:    ==================================================================== [facility] (MW) ==================================================================== Test                             Result       Flag       Units Drug Present and Declared for Prescription Verification   Hydrocodone                    >5263        EXPECTED   ng/mg creat   Hydromorphone                  309          EXPECTED   ng/mg creat   Dihydrocodeine                 987          EXPECTED   ng/mg creat   Norhydrocodone                 >2632        EXPECTED   ng/mg creat    Sources of hydrocodone include scheduled prescription medications.    Hydromorphone, dihydrocodeine and norhydrocodone are expected    metabolites of hydrocodone. Hydromorphone and dihydrocodeine are    also available as scheduled prescription medications. ==================================================================== Test                      Result    Flag   Units      Ref Range   Creatinine              190              mg/dL      >=20 ==================================================================== Declared Medications:  The flagging and interpretation on this report are based on the  following declared medications.  Unexpected results may arise from  inaccuracies in the declared medications.  **Note: The testing scope of this panel includes these medications:  Hydrocodone (Norco)  **Note: The testing scope of this panel does not include the  following reported medications:  Acetaminophen (Norco)  Albuterol (Proair HFA)  Baclofen (Lioresal)  Budesonide  Docusate (Colace)  Esomeprazole (Nexium)  Formoterol  Glycopyrrolate  Levothyroxine (Synthroid)  Lisinopril (Zestril)  Montelukast (Singulair)  Ondansetron (Zofran)  Polyethylene Glycol (MiraLAX)  Sumatriptan (Imitrex)  Verapamil ==================================================================== For clinical consultation, please  call [phone]. ====================================================================      ROS  Constitutional: Denies any fever or chills Gastrointestinal: No reported hemesis, hematochezia, vomiting, or acute GI distress Musculoskeletal: Denies any acute onset joint swelling, redness, loss of ROM, or weakness Neurological: No reported episodes of acute onset apraxia, aphasia, dysarthria, agnosia, amnesia, paralysis, loss of coordination, or loss of consciousness  Medication Review  Budeson-Glycopyrrol-Formoterol, HYDROcodone-acetaminophen, docusate sodium, levothyroxine, lisinopril-hydrochlorothiazide, montelukast, polyethylene glycol powder, and senna-docusate  History Review  Allergy: Ms. Mirkin has No Known Allergies. Drug: Ms. Delahunt  reports no history of drug use. Alcohol:  reports no history of alcohol use. Tobacco:  reports that she quit smoking about 17 months ago. She has a 25.00 pack-year smoking history. She has quit using smokeless tobacco. Social: Ms. Colla  reports that she quit smoking about 17 months ago. She has a 25.00 pack-year smoking history. She has quit using smokeless tobacco. She reports that she does not drink alcohol and does not use drugs. Medical:  has a past medical history of Abnormal EKG, CAD (coronary  artery disease), Cancer ([place]), Endometrial adenocarcinoma ([place]) (2011), Hyperlipidemia, Hypertension, Osteoarthritis, Reflux gastritis, Thyroid disease, tobacco abuse, and Vaginal delivery. Surgical: Ms. Whitmire  has a past surgical history that includes Thyroidectomy (2005); Cardiac catheterization (2006); Total abdominal hysterectomy; Breast surgery (Right, March 2014); and Breast cyst aspiration (Right). Family: family history includes Alcohol abuse in her father, maternal aunt, and mother; Cancer in her brother, brother, brother, and maternal aunt; Heart disease in her brother.  Laboratory Chemistry Profile   Renal Lab Results  Component  Value Date   BUN 14 08/30/2019   CREATININE 0.87 23/53/6144   BCR NOT APPLICABLE 31/54/0086   GFR 60.34 01/21/2015   GFRAA 80 08/30/2019   GFRNONAA 69 08/30/2019     Hepatic Lab Results  Component Value Date   AST 17 08/30/2019   ALT 6 08/30/2019   ALBUMIN 4.4 08/29/2017   ALKPHOS 126 (H) 08/29/2017   HCVAB NEGATIVE 01/21/2015     Electrolytes Lab Results  Component Value Date   NA 141 08/30/2019   K 4.4 08/30/2019   CL 105 08/30/2019   CALCIUM 9.0 08/30/2019   MG 1.9 06/04/2018     Bone Lab Results  Component Value Date   VD25OH 26.66 (L) 05/14/2014     Inflammation (CRP: Acute Phase) (ESR: Chronic Phase) Lab Results  Component Value Date   ESRSEDRATE 19 03/30/2012       Note: Above Lab results reviewed.  Recent Imaging Review  MR BRAIN WO CONTRAST CLINICAL DATA:  Fall 3 months ago with head injury. Headache and memory loss.  EXAM: MRI HEAD WITHOUT CONTRAST  TECHNIQUE: Multiplanar, multiecho pulse sequences of the brain and surrounding structures were obtained without intravenous contrast.  COMPARISON:  04/06/2012  FINDINGS: Brain: No acute infarction, hemorrhage, hydrocephalus, extra-axial collection or mass lesion. Confluent FLAIR hyperintensity in the deep cerebral white matter attributed to chronic small vessel disease given age and vascular risk factors. Brain volume is normal. History of trauma with no chronic blood products, posttraumatic gliosis, or collection. No hydrocephalus or masslike finding.  Vascular: Normal flow voids  Skull and upper cervical spine: Normal marrow signal. Degenerative facet spurring.  Sinuses/Orbits: Negative  IMPRESSION: 1. No reversible or specific posttraumatic finding. 2. Extensive chronic white matter disease  Electronically Signed   By: Monte Fantasia M.D.   On: 09/26/2019 10:44 Note: Reviewed        Physical Exam  General appearance: Well nourished, well developed, and well hydrated. In no  apparent acute distress Mental status: Alert, oriented x 3 (person, place, & time)       Respiratory: No evidence of acute respiratory distress Eyes: PERLA Vitals: BP (!) 141/105   Pulse 92   Temp (!) 97 F (36.1 C) (Temporal)   Resp 16   Ht 5' 8" (1.727 m)   Wt (!) 210 lb (95.3 kg)   SpO2 98%   BMI 31.93 kg/m  BMI: Estimated body mass index is 31.93 kg/m as calculated from the following:   Height as of this encounter: 5' 8" (1.727 m).   Weight as of this encounter: 210 lb (95.3 kg). Ideal: Ideal body weight: 63.9 kg (140 lb 14 oz) Adjusted ideal body weight: 76.4 kg (168 lb 8.4 oz)  Lumbar Spine Area Exam  Skin & Axial Inspection: No masses, redness, or swelling Alignment: Symmetrical Functional ROM: Unrestricted ROM       Stability: No instability detected Muscle Tone/Strength: Functionally intact. No obvious neuro-muscular anomalies detected. Sensory (Neurological): Musculoskeletal pain pattern  Lower Extremity  Exam    Side: Right lower extremity  Side: Left lower extremity  Stability: No instability observed          Stability: No instability observed          Skin & Extremity Inspection: Skin color, temperature, and hair growth are WNL. No peripheral edema or cyanosis. No masses, redness, swelling, asymmetry, or associated skin lesions. No contractures.  Skin & Extremity Inspection: Skin color, temperature, and hair growth are WNL. No peripheral edema or cyanosis. No masses, redness, swelling, asymmetry, or associated skin lesions. No contractures.  Functional ROM: Unrestricted ROM                  Functional ROM: Pain restricted ROM for hip and knee joints          Muscle Tone/Strength: Functionally intact. No obvious neuro-muscular anomalies detected.  Muscle Tone/Strength: Functionally intact. No obvious neuro-muscular anomalies detected.  Sensory (Neurological): Unimpaired        Sensory (Neurological): Arthropathic arthralgia        DTR: Patellar: deferred  today Achilles: deferred today Plantar: deferred today  DTR: Patellar: deferred today Achilles: deferred today Plantar: deferred today  Palpation: No palpable anomalies  Palpation: No palpable anomalies    Assessment   Status Diagnosis  Persistent Persistent Persistent 1. Primary osteoarthritis of left knee   2. Chronic pain syndrome   3. Chronic use of opiate for therapeutic purpose   4. Bilateral primary osteoarthritis of knee   5. Primary osteoarthritis of right ankle   6. Chronic pain of right ankle       Plan of Care  Ms. Raynee MAYSEN BONSIGNORE has a current medication list which includes the following long-term medication(s): levothyroxine, lisinopril-hydrochlorothiazide, and montelukast.  Patty follows up today for postprocedural evaluation and medication management.  She has completed 5 series of left intra-articular Hyalgan knee injections which have provided her with mild to at best moderate benefit of her left knee pain.  She states that her knee pain is pretty much back to her baseline prior to her left knee Hyalgan injection #5.  She continues her hydrocodone as prescribed.  She does find functional benefit and pain relief at her higher dose of 7.5 mg 3 times daily as needed so for this reason we will continue her dose as it is.  Of note, her right knee is doing fine without any pain.  Prior urine toxicology screen was appropriate.  Patient will follow up in 3 months for medication refill and to discuss her left knee pain and treatment plans going further.  Pharmacotherapy (Medications Ordered): Meds ordered this encounter  Medications  . HYDROcodone-acetaminophen (NORCO) 7.5-325 MG tablet    Sig: Take 1 tablet by mouth every 6 (six) hours as needed for severe pain. Must last 30 days.    Dispense:  105 tablet    Refill:  0    [place] STOP ACT - Not applicable. Fill one day early if pharmacy is closed on scheduled refill date.  [name] HYDROcodone-acetaminophen (NORCO) 7.5-325 MG  tablet    Sig: Take 1 tablet by mouth every 6 (six) hours as needed for severe pain. Must last 30 days.    Dispense:  105 tablet    Refill:  0    [place] STOP ACT - Not applicable. Fill one day early if pharmacy is closed on scheduled refill date.  [name] HYDROcodone-acetaminophen (NORCO) 7.5-325 MG tablet    Sig: Take 1 tablet by mouth every 6 (six) hours as needed  for severe pain. Must last 30 days.    Dispense:  105 tablet    Refill:  0    [place] STOP ACT - Not applicable. Fill one day early if pharmacy is closed on scheduled refill date.   Follow-up plan:   Return in about 3 months (around 02/07/2020) for Medication Management, in person.     Status post left genicular RFA 04/16/2018, status post right genicular RFA 03/12/2018.  Provided her with benefit.  Status post left knee intra-articular steroid injection #3 on 11/21/2018.   hyalgan left knee #1 on 04/17/19, #2 05/22/19, #3 07/03/2019, #4 08/28/19, #5 on 09/25/19       Recent Visits Date Type Provider Dept  09/25/19 Procedure visit Gillis Santa, MD [facility]  08/28/19 Procedure visit Gillis Santa, MD [facility]  08/12/19 Telemedicine Gillis Santa, MD [facility]  Showing recent visits within past 90 days and meeting all other requirements Today's Visits Date Type Provider Dept  11/07/19 Office Visit Gillis Santa, MD [facility]  Showing today's visits and meeting all other requirements Future Appointments No visits were found meeting these conditions. Showing future appointments within next 90 days and meeting all other requirements  I discussed the assessment and treatment plan with the patient. The patient was provided an opportunity to ask questions and all were answered. The patient agreed with the plan and demonstrated an understanding of the instructions.  Patient advised to call back or seek an in-person evaluation if the symptoms or condition worsens.  Duration of encounter: 48mnutes.  Note  by: BGillis Santa MD Date: 11/07/2019; Time: 9:51 AM

## 2019-11-07 NOTE — Progress Notes (Signed)
Nursing Pain Medication Assessment:  Safety precautions to be maintained throughout the outpatient stay will include: orient to surroundings, keep bed in low position, maintain call bell within reach at all times, provide assistance with transfer out of bed and ambulation.  Medication Inspection Compliance: Pill count conducted under aseptic conditions, in front of the patient. Neither the pills nor the bottle was removed from the patient's sight at any time. Once count was completed pills were immediately returned to the patient in their original bottle.  Medication: Hydrocodone/APAP Pill/Patch Count: 39 of 105 pills remain Pill/Patch Appearance: Markings consistent with prescribed medication Bottle Appearance: Standard pharmacy container. Clearly labeled. Filled Date: 10/21/2019 Last Medication intake:  Today

## 2019-11-08 ENCOUNTER — Ambulatory Visit: Payer: Medicare Other | Admitting: Pharmacist

## 2019-11-08 ENCOUNTER — Other Ambulatory Visit: Payer: Self-pay | Admitting: Family Medicine

## 2019-11-08 DIAGNOSIS — E890 Postprocedural hypothyroidism: Secondary | ICD-10-CM

## 2019-11-08 DIAGNOSIS — J441 Chronic obstructive pulmonary disease with (acute) exacerbation: Secondary | ICD-10-CM | POA: Diagnosis not present

## 2019-11-08 DIAGNOSIS — I10 Essential (primary) hypertension: Secondary | ICD-10-CM

## 2019-11-08 DIAGNOSIS — E785 Hyperlipidemia, unspecified: Secondary | ICD-10-CM | POA: Diagnosis not present

## 2019-11-08 MED ORDER — ALBUTEROL SULFATE HFA 108 (90 BASE) MCG/ACT IN AERS: 1.0000 | INHALATION_SPRAY | Freq: Four times a day (QID) | RESPIRATORY_TRACT | 1 refills | Status: DC | PRN

## 2019-11-08 NOTE — Chronic Care Management (AMB) (Signed)
Chronic Care Management   Follow Up Note   11/08/2019 Name: Pamela Romero MRN: [id number] DOB: November 24, 1950  Referred by: Pamela Bangs, FNP Reason for referral : Chronic Care Management (Patient Phone Call)   Pamela Romero is a 69 y.o. year old female who is a primary care patient of Pamela Bangs, FNP. The CCM team was consulted for assistance with chronic disease management and care coordination needs.    I reached out to Pamela Romero by phone today.   Review of patient status, including review of consultants reports, relevant laboratory and other test results, and collaboration with appropriate care team members and the patient's provider was performed as part of comprehensive patient evaluation and provision of chronic care management services.      Outpatient Encounter Medications as of 11/08/2019  Medication Sig  . Budeson-Glycopyrrol-Formoterol (BREZTRI AEROSPHERE) 160-9-4.8 MCG/ACT AERO Inhale 2 puffs into the lungs 2 (two) times daily.  [name] levothyroxine (SYNTHROID) 125 MCG tablet Take 1 tablet (125 mcg total) by mouth daily.  [name] lisinopril-hydrochlorothiazide (ZESTORETIC) 20-12.5 MG tablet Take 1 tablet by mouth daily.  [name] docusate sodium (COLACE) 100 MG capsule Take 1 capsule (100 mg total) by mouth 2 (two) times daily as needed.  [name] ON 11/20/2019] HYDROcodone-acetaminophen (NORCO) 7.5-325 MG tablet Take 1 tablet by mouth every 6 (six) hours as needed for severe pain. Must last 30 days.  [name] ON 12/20/2019] HYDROcodone-acetaminophen (NORCO) 7.5-325 MG tablet Take 1 tablet by mouth every 6 (six) hours as needed for severe pain. Must last 30 days.  [name] ON 01/19/2020] HYDROcodone-acetaminophen (NORCO) 7.5-325 MG tablet Take 1 tablet by mouth every 6 (six) hours as needed for severe pain. Must last 30 days.  . montelukast (SINGULAIR) 10 MG tablet Take 1 tablet (10 mg total) by mouth at bedtime.  . polyethylene glycol powder (GLYCOLAX/MIRALAX) 17 GM/SCOOP powder  Take 17 g by mouth daily as needed for mild constipation or moderate constipation.  . senna-docusate (SENNA-S) 8.6-50 MG tablet Take 1 tablet by mouth 2 (two) times daily as needed for mild constipation.   No facility-administered encounter medications on file as of 11/08/2019.    Goals Addressed            This Visit's Progress   . PharmD - "I do not have any of my inhaler"       [place] (see longtitudinal plan of care for additional care plan information)   Current Barriers:  . Chronic Disease Management support, education, and care coordination needs related to HTN, COPD, and hypothyroidism . Financial Barriers  Pharmacist Clinical Goal(s):  [name] Over the next 30 days, patient will work with CM Pharmacist to address needs related to medication adherence and medication assistance  Interventions: . Perform chart review . Counsel on importance of blood pressure control and monitoring ? Reports taking lisinopril-HCTZ 20-12.5 mg once daily ? Reports obtained an upper arm monitor and checking home BP ? Reports BP elevated yesterday, 132/104, HR 100, when she was in pain (headache) ? Reports BP today: 120/87, HR 86  ? Counsel on BP monitoring technique ? Encourage patient to continue to monitor home BP, keep log and bring with her to medical appointments ? Encourage patient to call provider for readings outside of established parameters or new symptoms . Counsel on importance of medication adherence.  ? Encourage patient to use maintenance inhaler consistently Pamela Romero) twice daily as directed and rinse out mouth after use. ? Patient reports previously had  albuterol inhaler to use as needed for shortness of breath in past, but states does not at this time.  . Follow up with patient regarding medication assistance. Reports assistance application no longer needed as inhaler now affordable to her . Place coordination of care call to [facility]. States patient needing new Rx for  generic albuterol HFA inhaler . Coordination of care: ? Reports she scheduled mammogram for 8/10 ? Encourage patient to follow up with [facility] sleep clinic to reschedule missed sleep study from 7/6 . Will collaborate with PCP to request new Rx for albuterol inhaler to patient's pharmacy  Patient Self Care Activities:  . Unable to self administer medications as prescribed, due to cost of medication  Please see past updates related to this goal by clicking on the "Past Updates" button in the selected goal         Plan  Telephone follow up appointment with care management team member scheduled for: 8/30 at 2 pm  Pamela Romero, PharmD, [place] [phone]

## 2019-11-08 NOTE — Patient Instructions (Signed)
Thank you allowing the Chronic Care Management Team to be a part of your care! It was a pleasure speaking with you today!     CCM (Chronic Care Management) Team    Noreene Larsson RN, MSN, CCM Nurse Care Coordinator  [phone]   Harlow Asa PharmD  Clinical Pharmacist  [phone]   [name] LCSW Clinical Social Worker [phone]  Visit Information  Goals Addressed            This Visit's Progress   . PharmD - "I do not have any of my inhaler"       [place] (see longtitudinal plan of care for additional care plan information)   Current Barriers:  . Chronic Disease Management support, education, and care coordination needs related to HTN, COPD, and hypothyroidism . Financial Barriers  Pharmacist Clinical Goal(s):  [name] Over the next 30 days, patient will work with CM Pharmacist to address needs related to medication adherence and medication assistance  Interventions: . Perform chart review . Counsel on importance of blood pressure control and monitoring ? Reports taking lisinopril-HCTZ 20-12.5 mg once daily ? Reports obtained an upper arm monitor and checking home BP ? Reports BP elevated yesterday, 132/104, HR 100, when she was in pain (headache) ? Reports BP today: 120/87, HR 86  ? Counsel on BP monitoring technique ? Encourage patient to continue to monitor home BP, keep log and bring with her to medical appointments ? Encourage patient to call provider for readings outside of established parameters or new symptoms . Counsel on importance of medication adherence.  ? Encourage patient to use maintenance inhaler consistently [name]) twice daily as directed and rinse out mouth after use. ? Patient reports previously had albuterol inhaler to use as needed for shortness of breath in past, but states does not at this time.  . Follow up with patient regarding medication assistance. Reports assistance application no longer needed as inhaler now  affordable to her . Place coordination of care call to [facility]. States patient needing new Rx for generic albuterol HFA inhaler . Coordination of care: ? Reports she scheduled mammogram for 8/10 ? Encourage patient to follow up with [facility] sleep clinic to reschedule missed sleep study from 7/6 . Will collaborate with PCP to request new Rx for albuterol inhaler to patient's pharmacy  Patient Self Care Activities:  . Unable to self administer medications as prescribed, due to cost of medication  Please see past updates related to this goal by clicking on the "Past Updates" button in the selected goal         Patient verbalizes understanding of instructions provided today.   Telephone follow up appointment with care management team member scheduled for: 8/30 at 2 pm  Harlow Asa, PharmD, [place] [phone]

## 2019-11-19 ENCOUNTER — Ambulatory Visit
Admission: RE | Admit: 2019-11-19 | Discharge: 2019-11-19 | Disposition: A | Discharge status: Home / Self Care | Payer: Medicare Other | Source: Ambulatory Visit | Attending: Family Medicine | Admitting: Family Medicine

## 2019-11-19 ENCOUNTER — Encounter: Payer: Self-pay | Admitting: Family Medicine

## 2019-11-19 ENCOUNTER — Other Ambulatory Visit: Payer: Self-pay

## 2019-11-19 DIAGNOSIS — Z1231 Encounter for screening mammogram for malignant neoplasm of breast: Secondary | ICD-10-CM

## 2019-11-19 DIAGNOSIS — Z780 Asymptomatic menopausal state: Secondary | ICD-10-CM

## 2019-11-19 DIAGNOSIS — M810 Age-related osteoporosis without current pathological fracture: Secondary | ICD-10-CM | POA: Insufficient documentation

## 2019-11-22 ENCOUNTER — Other Ambulatory Visit: Payer: Self-pay | Admitting: Family Medicine

## 2019-11-22 DIAGNOSIS — M810 Age-related osteoporosis without current pathological fracture: Secondary | ICD-10-CM

## 2019-11-22 MED ORDER — ALENDRONATE SODIUM 70 MG PO TABS: 70.0000 mg | ORAL_TABLET | ORAL | 11 refills | Status: DC

## 2019-11-28 ENCOUNTER — Other Ambulatory Visit: Payer: Self-pay | Admitting: Family Medicine

## 2019-11-28 DIAGNOSIS — I10 Essential (primary) hypertension: Secondary | ICD-10-CM

## 2019-11-29 ENCOUNTER — Telehealth: Payer: Self-pay

## 2019-12-05 ENCOUNTER — Telehealth: Payer: Self-pay

## 2019-12-05 ENCOUNTER — Telehealth: Payer: Self-pay | Admitting: Licensed Clinical Social Worker

## 2019-12-05 NOTE — Telephone Encounter (Signed)
  Chronic Care Management    Clinical Social Work General Follow Up Note  12/05/2019 Name: Pamela Romero MRN: [id number] DOB: [birth year]  Pamela Romero is a 69 y.o. year old female who is a primary care patient of [name], [name], FNP. The CCM team was consulted for assistance with [employer]  and [place] [employer].   Review of patient status, including review of consultants reports, relevant laboratory and other test results, and collaboration with appropriate care team members and the patient's provider was performed as part of comprehensive patient evaluation and provision of chronic care management services.    LCSW completed CCM outreach attempt today but was unable to reach patient successfully. A HIPPA compliant voice message was left encouraging patient to return call once available. LCSW rescheduled CCM SW appointment as well.  Advanced Directives Status: <no information> See Care Plan for related entries.   Outpatient Encounter Medications as of 12/05/2019  Medication Sig  . albuterol (PROAIR HFA) 108 (90 Base) MCG/ACT inhaler Inhale 1-2 puffs into the lungs every 6 (six) hours as needed for wheezing or shortness of breath.  [name] alendronate (FOSAMAX) 70 MG tablet Take 1 tablet (70 mg total) by mouth every 7 (seven) days. Take with a full glass of water on an empty stomach.  . Budeson-Glycopyrrol-Formoterol (BREZTRI AEROSPHERE) 160-9-4.8 MCG/ACT AERO Inhale 2 puffs into the lungs 2 (two) times daily.  [name] docusate sodium (COLACE) 100 MG capsule Take 1 capsule (100 mg total) by mouth 2 (two) times daily as needed.  [name] HYDROcodone-acetaminophen (NORCO) 7.5-325 MG tablet Take 1 tablet by mouth every 6 (six) hours as needed for severe pain. Must last 30 days.  [name] ON 12/20/2019] HYDROcodone-acetaminophen (NORCO) 7.5-325 MG tablet Take 1 tablet by mouth every 6 (six) hours as needed for severe pain. Must last 30 days.  [name] ON 01/19/2020]  HYDROcodone-acetaminophen (NORCO) 7.5-325 MG tablet Take 1 tablet by mouth every 6 (six) hours as needed for severe pain. Must last 30 days.  [name] levothyroxine (SYNTHROID) 125 MCG tablet Take 1 tablet (125 mcg total) by mouth daily.  [name] lisinopril-hydrochlorothiazide (ZESTORETIC) 20-12.5 MG tablet Take 1 tablet by mouth daily.  . montelukast (SINGULAIR) 10 MG tablet Take 1 tablet (10 mg total) by mouth at bedtime.  . polyethylene glycol powder (GLYCOLAX/MIRALAX) 17 GM/SCOOP powder Take 17 g by mouth daily as needed for mild constipation or moderate constipation.  . senna-docusate (SENNA-S) 8.6-50 MG tablet Take 1 tablet by mouth 2 (two) times daily as needed for mild constipation.   No facility-administered encounter medications on file as of 12/05/2019.   Follow Up Plan: LCSW will reschedule CCM Social Work appointment.    [name], BSW, MSW, [email or web address] Phone: [phone]

## 2019-12-09 ENCOUNTER — Ambulatory Visit (INDEPENDENT_AMBULATORY_CARE_PROVIDER_SITE_OTHER): Payer: Medicare Other | Admitting: Pharmacist

## 2019-12-09 DIAGNOSIS — M810 Age-related osteoporosis without current pathological fracture: Secondary | ICD-10-CM

## 2019-12-09 DIAGNOSIS — I10 Essential (primary) hypertension: Secondary | ICD-10-CM

## 2019-12-09 DIAGNOSIS — J441 Chronic obstructive pulmonary disease with (acute) exacerbation: Secondary | ICD-10-CM | POA: Diagnosis not present

## 2019-12-09 NOTE — Chronic Care Management (AMB) (Signed)
Chronic Care Management   Follow Up Note   12/09/2019 Name: HOLLACE MICHELLI MRN: [id number] DOB: 12-25-1950  Referred by: [name], FNP Reason for referral : Chronic Care Management (Patient Phone Call)   SHARIFAH CHAMPINE is a 69 y.o. year old female who is a primary care patient of [name], FNP. The CCM team was consulted for assistance with chronic disease management and care coordination needs.    I reached out to [name] by phone today.   Review of patient status, including review of consultants reports, relevant laboratory and other test results, and collaboration with appropriate care team members and the patient's provider was performed as part of comprehensive patient evaluation and provision of chronic care management services.    SDOH (Social Determinants of Health) assessments performed: No See Care Plan activities for detailed interventions related to [facility])     Outpatient Encounter Medications as of 12/09/2019  Medication Sig Note  . albuterol (PROAIR HFA) 108 (90 Base) MCG/ACT inhaler Inhale 1-2 puffs into the lungs every 6 (six) hours as needed for wheezing or shortness of breath.   [name] alendronate (FOSAMAX) 70 MG tablet Take 1 tablet (70 mg total) by mouth every 7 (seven) days. Take with a full glass of water on an empty stomach. 12/09/2019: Taking on Fridays  . esomeprazole (NEXIUM) 40 MG capsule Take 40 mg by mouth daily.   [name] HYDROcodone-acetaminophen (NORCO) 7.5-325 MG tablet Take 1 tablet by mouth every 6 (six) hours as needed for severe pain. Must last 30 days.   [name] levothyroxine (SYNTHROID) 125 MCG tablet Take 1 tablet (125 mcg total) by mouth daily.   . sertraline (ZOLOFT) 25 MG tablet Take 1 tablet by mouth daily.   . Budeson-Glycopyrrol-Formoterol (BREZTRI AEROSPHERE) 160-9-4.8 MCG/ACT AERO Inhale 2 puffs into the lungs 2 (two) times daily. (Patient not taking: Reported on 12/09/2019)   . docusate sodium (COLACE) 100 MG capsule Take 1 capsule  (100 mg total) by mouth 2 (two) times daily as needed.   [name] ON 12/20/2019] HYDROcodone-acetaminophen (NORCO) 7.5-325 MG tablet Take 1 tablet by mouth every 6 (six) hours as needed for severe pain. Must last 30 days.   [name] ON 01/19/2020] HYDROcodone-acetaminophen (NORCO) 7.5-325 MG tablet Take 1 tablet by mouth every 6 (six) hours as needed for severe pain. Must last 30 days.   [name] lisinopril-hydrochlorothiazide (ZESTORETIC) 20-12.5 MG tablet Take 1 tablet by mouth daily. (Patient not taking: Reported on 12/09/2019)   . montelukast (SINGULAIR) 10 MG tablet Take 1 tablet (10 mg total) by mouth at bedtime. (Patient not taking: Reported on 12/09/2019)   . polyethylene glycol powder (GLYCOLAX/MIRALAX) 17 GM/SCOOP powder Take 17 g by mouth daily as needed for mild constipation or moderate constipation.   . senna-docusate (SENNA-S) 8.6-50 MG tablet Take 1 tablet by mouth 2 (two) times daily as needed for mild constipation.    No facility-administered encounter medications on file as of 12/09/2019.    Goals Addressed            This Visit's Progress   . PharmD - "I do not have any of my inhaler"       [place] (see longtitudinal plan of care for additional care plan information)   Current Barriers:  . Chronic Disease Management support, education, and care coordination needs related to HTN, COPD, and hypothyroidism . Financial Barriers  Pharmacist Clinical Goal(s):  [name] Over the next 30 days, patient will work with [employer] Pharmacist to  address needs related to medication adherence and medication assistance  Interventions: . Perform chart review ? Patient seen by [facility] Neurology on 8/24 ? Advised to start on sertraline 25 mg once daily for mood ? Checked labs for Vitamin B12 and Vitamin D . Counsel on importance of blood pressure control and monitoring ? Reports concerned that recent BP results have been elevated ? Last checked this morning: 132/109, HR 85 ? Note from  chart, reading in office at [facility] on 8/24: 159/127, HR 89 ? Identify patient taking lisinopril 20 mg daily, NOT lisinopril-HCTZ 20-12.5 mg  ? Patient reports she recently discarded medications that she was no longer taking and must have discarded the lisinopril-HCTZ by mistake ? Encourage patient to follow up with [facility] to request refill of correct lisinopril-HCTZ Rx. ? Counsel on BP monitoring technique, particularly importance of resting prior to taking reading ? Encourage patient to continue to monitor home BP, keep log and bring with her to medical appointments ? Encourage patient to call provider for readings outside of established parameters or new symptoms . Counsel on importance of medication adherence.  ? Confirms using weekly pillbox as adherence aid ? Confirms taking alendronate once weekly (on Fridays) as directed with full glass of water 30 minutes prior to food ? Identify patient has confused Proair inhaler with Breztri inhaler. Reports using Proair on a maintenance basis rather than as needed.  ? Counsel patient on use of Breztri as maintenance inhaler versus Proair as rescue inhaler. ? Encourage patient to refill of Breztri inhaler and take as directed . Encourage patient to be cautious with dizziness/sedation with hydrocodone. Patient reports that she lays down after taking to avoid dizziness. . Encourage patient to follow up with [facility] Neurology for results of lab work . Coordination of care: Patient reports sleep study has been rescheduled for 9/9 . Counsel to call providers for new or worsening medical concerns . Place coordination of care call to [facility].  ? Confirm lisinopril-HCTZ Rx eligible for refill today - will prepare for patient ? Find Breztri inhaler Rx is on hold- never picked up by patient due to cost . Follow up with patient. Ms. Lubbers would like to pick up medication assistance application for Breztri through [employer] from  office ? Will collaborate with office and [facility] CPhT  Patient Self Care Activities:  . Unable to self administer medications as prescribed, due to cost of medication  Please see past updates related to this goal by clicking on the "Past Updates" button in the selected goal         Plan  Telephone follow up appointment with care management team member scheduled for: 9/3 10:45 am  Harlow Asa, PharmD, [facility] [phone]

## 2019-12-09 NOTE — Patient Instructions (Signed)
Thank you allowing the Chronic Care Management Team to be a part of your care! It was a pleasure speaking with you today!     CCM (Chronic Care Management) Team    Noreene Larsson RN, MSN, CCM Nurse Care Coordinator  [phone]   Harlow Asa PharmD  Clinical Pharmacist  [phone]   [name] LCSW Clinical Social Worker [phone]  Visit Information  Goals Addressed            This Visit's Progress    PharmD - "I do not have any of my inhaler"       CARE PLAN ENTRY (see longtitudinal plan of care for additional care plan information)   Current Barriers:   Chronic Disease Management support, education, and care coordination needs related to HTN, COPD, and hypothyroidism  Financial Barriers  Pharmacist Clinical Goal(s):   Over the next 30 days, patient will work with CM Pharmacist to address needs related to medication adherence and medication assistance  Interventions:  Perform chart review ? Patient seen by [facility] Neurology on 8/24 ? Advised to start on sertraline 25 mg once daily for mood ? Checked labs for Vitamin B12 and Vitamin D  Counsel on importance of blood pressure control and monitoring ? Reports concerned that recent BP results have been elevated ? Last checked this morning: 132/109, HR 85 ? Note from chart, reading in office at [facility] on 8/24: 159/127, HR 89 ? Identify patient taking lisinopril 20 mg daily, NOT lisinopril-HCTZ 20-12.5 mg  ? Patient reports she recently discarded medications that she was no longer taking and must have discarded the lisinopril-HCTZ by mistake ? Encourage patient to follow up with [facility] to request refill of correct lisinopril-HCTZ Rx. ? Counsel on BP monitoring technique, particularly importance of resting prior to taking reading ? Encourage patient to continue to monitor home BP, keep log and bring with her to medical appointments ? Encourage patient to call provider for readings  outside of established parameters or new symptoms  Counsel on importance of medication adherence.  ? Confirms using weekly pillbox as adherence aid ? Confirms taking alendronate once weekly (on Fridays) as directed with full glass of water 30 minutes prior to food ? Identify patient has confused Proair inhaler with Breztri inhaler. Reports using Proair on a maintenance basis rather than as needed.  ? Counsel patient on use of Breztri as maintenance inhaler versus Proair as rescue inhaler. ? Encourage patient to refill of Breztri inhaler and take as directed  Encourage patient to be cautious with dizziness/sedation with hydrocodone. Patient reports that she lays down after taking to avoid dizziness.  Encourage patient to follow up with [facility] Neurology for results of lab work  Coordination of care: Patient reports sleep study has been rescheduled for 9/9  Counsel to call providers for new or worsening medical concerns  Place coordination of care call to [facility].  ? Confirm lisinopril-HCTZ Rx eligible for refill today - will prepare for patient ? Find Breztri inhaler Rx is on hold- never picked up by patient due to cost  Follow up with patient. Ms. Haubner would like to pick up medication assistance application for Breztri through AZ[employer]Me from office ? Will collaborate with office and [facility] CPhT  Patient Self Care Activities:   Unable to self administer medications as prescribed, due to cost of medication  Please see past updates related to this goal by clicking on the "Past Updates" button in the selected goal  Patient verbalizes understanding of instructions provided today.   Telephone follow up appointment with care management team member scheduled for:  9/3 10:45 am  Harlow Asa, PharmD, [place] [phone]

## 2019-12-10 NOTE — Telephone Encounter (Signed)
Open in error

## 2019-12-13 ENCOUNTER — Ambulatory Visit (INDEPENDENT_AMBULATORY_CARE_PROVIDER_SITE_OTHER): Payer: Medicare Other | Admitting: Pharmacist

## 2019-12-13 ENCOUNTER — Other Ambulatory Visit: Payer: Self-pay | Admitting: *Deleted

## 2019-12-13 DIAGNOSIS — I10 Essential (primary) hypertension: Secondary | ICD-10-CM

## 2019-12-13 DIAGNOSIS — J441 Chronic obstructive pulmonary disease with (acute) exacerbation: Secondary | ICD-10-CM

## 2019-12-13 DIAGNOSIS — E890 Postprocedural hypothyroidism: Secondary | ICD-10-CM | POA: Diagnosis not present

## 2019-12-13 DIAGNOSIS — M19071 Primary osteoarthritis, right ankle and foot: Secondary | ICD-10-CM | POA: Diagnosis not present

## 2019-12-13 NOTE — Patient Instructions (Signed)
Thank you allowing the Chronic Care Management Team to be a part of your care! It was a pleasure speaking with you today!     CCM (Chronic Care Management) Team    Noreene Larsson RN, MSN, CCM Nurse Care Coordinator  [phone]   Harlow Asa PharmD  Clinical Pharmacist  [phone]   [name] LCSW Clinical Social Worker [phone]  Visit Information  Goals Addressed            This Visit's Progress   . PharmD - "I do not have any of my inhaler"       [place] (see longtitudinal plan of care for additional care plan information)   Current Barriers:  . Chronic Disease Management support, education, and care coordination needs related to HTN, COPD, and hypothyroidism . Financial Barriers  Pharmacist Clinical Goal(s):  [name] Over the next 30 days, patient will work with CM Pharmacist to address needs related to medication adherence and medication assistance  Interventions: . Counsel on importance of blood pressure control and monitoring ? Reports picked up and restarted Rx for lisinopril-HCTZ 20-12.5 mg once daily yesterday ? Reports has discarded previous lisinopril Rx to avoid future confusion ? Reports BP this morning: 132/108, HR 92 ? Counsel on BP monitoring technique, particularly importance of resting prior to taking reading ? Reports she is working on limiting salt/sodium in diet. Counsel on reviewing sodium content on nutrition labels ? Reports trying to build up to walking everyday, currently walking ~25 minutes every other day ? Encourage patient to continue to monitor home BP, keep log and bring with her to medical appointments ? Encourage patient to call provider for readings outside of established parameters or new symptom . Confirms followed up with Neurology regarding results of Vitamin D and Vitamin B12 lab work ? From review of shared record note results: ? Vitamin D: 8.4 ng/mL(12/03/19) ? Vitamin B12: 290 pg/mL(12/03/19) ? Reports advised  to start Vitamin D2 (50,000 IU weekly) as prescribed by Neurologist - patient states will pick up and start today ? Scheduled for Vitamin B12 injections, first on 9/8 . Counsel on importance of medication adherence.  ? Confirms using weekly pillbox as adherence aid ? Reports will add Vitamin D Rx to take every Friday ? Confirms taking alendronate once weekly (on Fridays) as directed with full glass of water 30 minutes prior to food . Follow up with patient regarding medication assistance application for Breztri through AZ[employer]Me. Confirms patient picked up application from office and patient planning to complete and return to office today to be faxed back to [employer], [place] . Patient to call providers for new or worsening medical concerns  Patient Self Care Activities:  . Unable to self administer medications as prescribed, due to cost of medication . Patient to attend medical appointments as scheduled o Appointment to receive Vitamin B12 injection on 9/8 o Sleep study scheduled for 9/9  Please see past updates related to this goal by clicking on the "Past Updates" button in the selected goal         Patient verbalizes understanding of instructions provided today.   Telephone follow up appointment with care management team member scheduled for: 10/1 at 104 am  Harlow Asa, PharmD, [place] [phone]

## 2019-12-13 NOTE — Chronic Care Management (AMB) (Signed)
Chronic Care Management   Follow Up Note   12/13/2019 Name: Pamela Romero MRN: [id number] DOB: 31-May-1950  Referred by: [name], FNP Reason for referral : Chronic Care Management (Patient Phone Call)   Pamela Romero is a 69 y.o. year old female who is a primary care patient of [name], FNP. The CCM team was consulted for assistance with chronic disease management and care coordination needs.    I reached out to [name] by phone today.   Review of patient status, including review of consultants reports, relevant laboratory and other test results, and collaboration with appropriate care team members and the patient's provider was performed as part of comprehensive patient evaluation and provision of chronic care management services.    SDOH (Social Determinants of Health) assessments performed: Yes See Care Plan activities for detailed interventions related to [facility])     Outpatient Encounter Medications as of 12/13/2019  Medication Sig Note  . ergocalciferol (VITAMIN D2) 1.25 MG (50000 UT) capsule Take 1 capsule (50,000 Units total) by mouth once a week for 8 doses   . lisinopril-hydrochlorothiazide (ZESTORETIC) 20-12.5 MG tablet Take 1 tablet by mouth daily.   [name] albuterol (PROAIR HFA) 108 (90 Base) MCG/ACT inhaler Inhale 1-2 puffs into the lungs every 6 (six) hours as needed for wheezing or shortness of breath.   [name] alendronate (FOSAMAX) 70 MG tablet Take 1 tablet (70 mg total) by mouth every 7 (seven) days. Take with a full glass of water on an empty stomach. 12/09/2019: Taking on Fridays  . Budeson-Glycopyrrol-Formoterol (BREZTRI AEROSPHERE) 160-9-4.8 MCG/ACT AERO Inhale 2 puffs into the lungs 2 (two) times daily. (Patient not taking: Reported on 12/09/2019)   . docusate sodium (COLACE) 100 MG capsule Take 1 capsule (100 mg total) by mouth 2 (two) times daily as needed.   [name] esomeprazole (NEXIUM) 40 MG capsule Take 40 mg by mouth daily.   [name]  HYDROcodone-acetaminophen (NORCO) 7.5-325 MG tablet Take 1 tablet by mouth every 6 (six) hours as needed for severe pain. Must last 30 days.   [name] ON 12/20/2019] HYDROcodone-acetaminophen (NORCO) 7.5-325 MG tablet Take 1 tablet by mouth every 6 (six) hours as needed for severe pain. Must last 30 days.   [name] ON 01/19/2020] HYDROcodone-acetaminophen (NORCO) 7.5-325 MG tablet Take 1 tablet by mouth every 6 (six) hours as needed for severe pain. Must last 30 days.   [name] levothyroxine (SYNTHROID) 125 MCG tablet Take 1 tablet (125 mcg total) by mouth daily.   . montelukast (SINGULAIR) 10 MG tablet Take 1 tablet (10 mg total) by mouth at bedtime. (Patient not taking: Reported on 12/09/2019)   . polyethylene glycol powder (GLYCOLAX/MIRALAX) 17 GM/SCOOP powder Take 17 g by mouth daily as needed for mild constipation or moderate constipation.   . senna-docusate (SENNA-S) 8.6-50 MG tablet Take 1 tablet by mouth 2 (two) times daily as needed for mild constipation.   . sertraline (ZOLOFT) 25 MG tablet Take 1 tablet by mouth daily.    No facility-administered encounter medications on file as of 12/13/2019.    Goals Addressed            This Visit's Progress   . PharmD - "I do not have any of my inhaler"       [place] (see longtitudinal plan of care for additional care plan information)   Current Barriers:  . Chronic Disease Management support, education, and care coordination needs related to HTN, COPD, and hypothyroidism . Financial  Barriers  Pharmacist Clinical Goal(s):  [name] Over the next 30 days, patient will work with CM Pharmacist to address needs related to medication adherence and medication assistance  Interventions: . Counsel on importance of blood pressure control and monitoring ? Reports picked up and restarted Rx for lisinopril-HCTZ 20-12.5 mg once daily yesterday ? Reports has discarded previous lisinopril Rx to avoid future confusion ? Reports BP this morning: 132/108, HR  92 ? Counsel on BP monitoring technique, particularly importance of resting prior to taking reading ? Reports she is working on limiting salt/sodium in diet. Counsel on reviewing sodium content on nutrition labels ? Reports trying to build up to walking everyday, currently walking ~25 minutes every other day ? Encourage patient to continue to monitor home BP, keep log and bring with her to medical appointments ? Encourage patient to call provider for readings outside of established parameters or new symptom . Confirms followed up with Neurology regarding results of Vitamin D and Vitamin B12 lab work ? From review of shared record note results: ? Vitamin D: 8.4 ng/mL(12/03/19) ? Vitamin B12: 290 pg/mL(12/03/19) ? Reports advised to start Vitamin D2 (50,000 IU weekly) as prescribed by Neurologist - patient states will pick up and start today ? Scheduled for Vitamin B12 injections, first on 9/8 . Counsel on importance of medication adherence.  ? Confirms using weekly pillbox as adherence aid ? Reports will add Vitamin D Rx to take every Friday ? Confirms taking alendronate once weekly (on Fridays) as directed with full glass of water 30 minutes prior to food . Follow up with patient regarding medication assistance application for Breztri through AZ[employer]Me. Confirms patient picked up application from office and patient planning to complete and return to office today to be faxed back to [employer], [place] . Patient to call providers for new or worsening medical concerns  Patient Self Care Activities:  . Unable to self administer medications as prescribed, due to cost of medication . Patient to attend medical appointments as scheduled o Appointment to receive Vitamin B12 injection on 9/8 o Sleep study scheduled for 9/9  Please see past updates related to this goal by clicking on the "Past Updates" button in the selected goal         Plan  Telephone follow up appointment with care management  team member scheduled for: 10/1 at 10 am  Harlow Asa, PharmD, [place] [phone]

## 2019-12-17 ENCOUNTER — Other Ambulatory Visit: Payer: Medicare Other

## 2019-12-17 ENCOUNTER — Other Ambulatory Visit: Payer: Self-pay | Admitting: Pharmacy Technician

## 2019-12-17 ENCOUNTER — Other Ambulatory Visit: Payer: Self-pay

## 2019-12-17 NOTE — Patient Outreach (Signed)
[place] [facility]) Care Management  12/17/2019  Pamela Romero 1951-04-10 [id number]  Received both patient and provider portion(s) of patient assistance application(s) for [name]. Faxed completed application and required documents into [employer].  Successful outreach call placed to patient, HIPAA identifiers verified. Patient informed the numbers under the income were the individual amounts and then larger number was the total. She informed those amounts were monthly totals. Inquired if I could update application and write monthly after the totals and patient was agreeable to this plan.  Will follow up with company(ies) in 5-10 business days to check status of application(s).  Linsay Vogt P. Siraj Dermody, [place]  [phone] .

## 2019-12-18 ENCOUNTER — Other Ambulatory Visit: Payer: Self-pay | Admitting: Family Medicine

## 2019-12-18 DIAGNOSIS — E890 Postprocedural hypothyroidism: Secondary | ICD-10-CM

## 2019-12-18 LAB — THYROID PANEL WITH TSH
Free Thyroxine Index: 1.4 (ref 1.4–3.8)
T3 Uptake: 21 % — ABNORMAL LOW (ref 22–35)
T4, Total: 6.6 ug/dL (ref 5.1–11.9)
TSH: 57.1 mIU/L — ABNORMAL HIGH (ref 0.40–4.50)

## 2019-12-19 ENCOUNTER — Ambulatory Visit: Payer: Medicare Other | Attending: Neurology

## 2019-12-19 DIAGNOSIS — G4761 Periodic limb movement disorder: Secondary | ICD-10-CM | POA: Insufficient documentation

## 2019-12-19 DIAGNOSIS — R0683 Snoring: Secondary | ICD-10-CM | POA: Diagnosis present

## 2019-12-20 ENCOUNTER — Other Ambulatory Visit: Payer: Self-pay

## 2019-12-23 ENCOUNTER — Ambulatory Visit: Payer: Self-pay | Admitting: General Practice

## 2019-12-23 ENCOUNTER — Telehealth: Payer: Medicare Other | Admitting: General Practice

## 2019-12-23 ENCOUNTER — Other Ambulatory Visit: Payer: Self-pay | Admitting: Pharmacy Technician

## 2019-12-23 DIAGNOSIS — E890 Postprocedural hypothyroidism: Secondary | ICD-10-CM

## 2019-12-23 DIAGNOSIS — M19071 Primary osteoarthritis, right ankle and foot: Secondary | ICD-10-CM

## 2019-12-23 DIAGNOSIS — I10 Essential (primary) hypertension: Secondary | ICD-10-CM

## 2019-12-23 DIAGNOSIS — K5900 Constipation, unspecified: Secondary | ICD-10-CM

## 2019-12-23 DIAGNOSIS — J441 Chronic obstructive pulmonary disease with (acute) exacerbation: Secondary | ICD-10-CM

## 2019-12-23 NOTE — Patient Outreach (Signed)
[facility]) Care Management  12/23/2019  SWEDEN LESURE 1951/02/05 [id number]   ADDENDUM  Unsuccessful outreach call placed to patient in regards to [place] application for [employer].  Unfortunately patient did not answer home or mobile number. No voicemail was able to be left. The home number rang about 15-20 times and then it disconnected you and the voicemail box was not set up on the mobile line.  Was calling to inquire about the income information that is needed by AZ&ME and to inquire if patient wanted to bring it by [facility] so they could send it to me to submit to AZ&ME.  Will follow up with patient in 3-5 business days.  Donell Sliwinski P. Cameryn Schum, [place]  [phone]

## 2019-12-23 NOTE — Patient Instructions (Signed)
Visit Information  Goals Addressed              This Visit's Progress     RNCM: PT- "I have not been taking my medication like I should" I need to do better.        CARE PLAN ENTRY (see longtitudinal plan of care for additional care plan information)  Current Barriers:   Chronic Disease Management support, education, and care coordination needs related to HTN, COPD, and Hypothyroidism   Financial Barriers   Clinical Goal(s) related to HTN, COPD, and Hypothyroidism :  Over the next 120 days, patient will:   Work with the care management team to address educational, disease management, and care coordination needs   Begin or continue self health monitoring activities as directed today Measure and record blood pressure 5 times per week and adhere to a Heart Healthy Diet  Call provider office for new or worsened signs and symptoms Blood pressure findings outside established parameters and New or worsened symptom related to COPD or Hypothyroidism   Call care management team with questions or concerns  Verbalize basic understanding of patient centered plan of care established today  Interventions related to HTN, COPD, and Hypothyroidism  :   Evaluation of current treatment plans and patient's adherence to plan as established by provider: Pt saw pcp recently and was evaluated for concerns. The patient has not always been taking her medications as prescribed. The patient is committed to changing her habits and getting on track with her health and well being.  The patient endorses doing better with taking her medications and also is happy to report her blood pressure is better. Still on the high side but it is coming down with the new medication regimen. The patient is experiencing dizziness at times but it is better. Education and support given. Her specialist is working on adjusting her thyroid medication as this has been a current and on going problem for the patient.  12-23-2019: The  patient is doing well and changing her habits.  She is waiting for call for referral for endocrinology to have help with managing her thyroid issues. She will follow up with them.   Assessed patient understanding of disease states- education and support  Assessed patient's education and care coordination needs- the patient is receptive to the CCM team working with her to help manage her health and well being.    The patient also wants recommendations from the pcp about a "coopertone belt" she has purchased for her back. She ordered the wrong size and is having to send that one back and get another one. She is wanting something that will help with her back because it is weak where she has not been exercising like she should.  Will collaborate with pcp and ask for recommendations. 10-21-2019: the patient is using the "belt" and feels like it is helping with her chronic back pain. She wants to talk to the pcp more about this also. She feels if she loses weight this will help also. She is working on dietary changes and improving her overall health and well being. 12-23-2019: The patient states she is still dealing with back pain but she is doing better. She feels when everything is back on track she will feel a lot better.   Provided disease specific education to patient - education on taking medications as prescribed, keeping a record of blood pressure readings, adherence to a Heart Healthy diet  Assessed the patient mental health. The patient is  so tired of being in her home but is excited about an upcoming trip to [place] to surprise her aunt. She feels like it will be good for her to get out of her house and [place]. 12-23-2019: The patient states that she is getting more involved with her church and they are helping her with socialization.  She wants to be a part of a social system that helps her with her social needs. Encouraged the patient to utilize her support systems and the CCM team for help and support.     Collaborated with appropriate clinical care team members regarding patient needs: Pharmacy referral for help with medication cost constraints and LCSW for help with stress and anxiety  Patient Self Care Activities related to HTN, COPD, and Hypothyroidism  :   Patient is unable to independently self-manage chronic health conditions  Please see past updates related to this goal by clicking on the "Past Updates" button in the selected goal        RNCM: pt-"I am having a time with constipation" (pt-stated)        CARE PLAN ENTRY (see longitudinal plan of care for additional care plan information)  Current Barriers:   Knowledge Deficits related to chronic constipation after cancer surgery several years ago  Care Coordination needs related to worsening constipation in a patient with Chronic constipation (disease states)  Chronic Disease Management support and education needs related to chronic constipation after cancer surgery   Nurse Case Manager Clinical Goal(s):   Over the next 120 days, patient will verbalize understanding of plan for managing chronic constipation   Over the next 120 days, patient will work with [facility] and pcp to address needs related to chronic constipation  Over the next 120 days, patient will demonstrate a decrease in constipation exacerbations as evidenced by finding a bowel habit ritual that works well with her system  Over the next 120 days, patient will attend all scheduled medical appointments: saw pcp on 09-24-2019 and upcoming appointment on 10-28-2019  Over the next 120 days, the patient will demonstrate ongoing self health care management ability as evidenced by managed constipation  Interventions:   Inter-disciplinary care team collaboration (see longitudinal plan of care)  Advised patient to talk to the pcp about her chronic constipation and recommendations  Provided education to patient re: alternative means for relief of constipation. The patient  was not having an issue until after her cancer surgery. The powder does not work as well. The patient states that she feels the constipation is getting worse. 12-23-2019: The patient is following the recommendations of the pcp. The patient says it is better but still not where it should be. Discussed dietary changes and habits.   Collaborated with pcp regarding the patients chronic constipation issues  Discussed plans with patient for ongoing care management follow up and provided patient with direct contact information for care management team  Provided patient with alternatives for healthy bowel habits educational materials related to chronic constipation  Reviewed scheduled/upcoming provider appointments including: The patient has no new appointments with the pcp. Will call when needed. Is waiting for specialist to call to follow up.    Patient Self Care Activities:   Patient verbalizes understanding of plan to work with [facility], CCM team and pcp to help with chronic constipation needs  Self administers medications as prescribed  Attends all scheduled provider appointments  Calls provider office for new concerns or questions  Unable to independently manage chronic constipation  Please see past updates  related to this goal by clicking on the "Past Updates" button in the selected goal         Patient verbalizes understanding of instructions provided today.   Telephone follow up appointment with care management team member scheduled for: 02-24-2020 at 10:15 am  [place], MSN, [place] Mobile: [phone]

## 2019-12-23 NOTE — Patient Outreach (Signed)
[facility]) Care Management  12/23/2019  Pamela Romero [birth year] [id number]   Care coordination call placed to AZ&ME in regards to [facility] application.   Spoke to Garrett who informed patient will need to submit her income as the income written on the application does not match the income that they received back from their inquiry. She also informed that the patient is aware of this information as it is documented that one of their representatives spoke to her on the 9th requesting this information.  Will outreach patient to update.  Gyneth Hubka P. Conard Alvira, [place]  [phone]

## 2019-12-23 NOTE — Patient Outreach (Addendum)
[facility]) Care Management  12/23/2019  KORALINE PHILLIPSON Sep 13, 1950 [id number]   ADDENDUM  Incoming call received from patient in regards to voicemail she left for me concerning AZ&ME application for [facility].  Spoke to patient, HIPAA identifiers verified.  Informed patient she was denied for the program until she could submit her proof of income. Discussed the types of income documents that would be acceptable to the patient assistance company. Patient informs everything is directly deposited and she has no copies of her social security awards statements, [phone], etc. She informed she would work on finding acceptable documents that show her and her husbands income and would drop it by the office but that "It would take a while" to gather.  Will route note to embedded [facility] in 15 business days for case closure if information is not received.  Micaela Stith P. Satori Krabill, [place]  [phone]

## 2019-12-23 NOTE — Chronic Care Management (AMB) (Signed)
Chronic Care Management   Follow Up Note   12/23/2019 Name: Pamela Romero MRN: [id number] DOB: 30-Jun-1950  Referred by: [name], FNP Reason for referral : Chronic Care Management (RNCM Follow up for Chronic Disease Management and Care Coordination Needs)   Pamela Romero is a 69 y.o. year old female who is a primary care patient of [name], FNP. The CCM team was consulted for assistance with chronic disease management and care coordination needs.    Review of patient status, including review of consultants reports, relevant laboratory and other test results, and collaboration with appropriate care team members and the patient's provider was performed as part of comprehensive patient evaluation and provision of chronic care management services.    SDOH (Social Determinants of Health) assessments performed: Yes See Care Plan activities for detailed interventions related to [facility])     Outpatient Encounter Medications as of 12/23/2019  Medication Sig Note  . albuterol (PROAIR HFA) 108 (90 Base) MCG/ACT inhaler Inhale 1-2 puffs into the lungs every 6 (six) hours as needed for wheezing or shortness of breath.   [name] alendronate (FOSAMAX) 70 MG tablet Take 1 tablet (70 mg total) by mouth every 7 (seven) days. Take with a full glass of water on an empty stomach. 12/09/2019: Taking on Fridays  . Budeson-Glycopyrrol-Formoterol (BREZTRI AEROSPHERE) 160-9-4.8 MCG/ACT AERO Inhale 2 puffs into the lungs 2 (two) times daily. (Patient not taking: Reported on 12/09/2019)   . docusate sodium (COLACE) 100 MG capsule Take 1 capsule (100 mg total) by mouth 2 (two) times daily as needed.   . ergocalciferol (VITAMIN D2) 1.25 MG (50000 UT) capsule Take 1 capsule (50,000 Units total) by mouth once a week for 8 doses   . esomeprazole (NEXIUM) 40 MG capsule Take 40 mg by mouth daily.   [name] HYDROcodone-acetaminophen (NORCO) 7.5-325 MG tablet Take 1 tablet by mouth every 6 (six) hours as needed for severe  pain. Must last 30 days.   [name] ON 01/19/2020] HYDROcodone-acetaminophen (NORCO) 7.5-325 MG tablet Take 1 tablet by mouth every 6 (six) hours as needed for severe pain. Must last 30 days.   [name] levothyroxine (SYNTHROID) 125 MCG tablet Take 1 tablet (125 mcg total) by mouth daily.   [name] lisinopril-hydrochlorothiazide (ZESTORETIC) 20-12.5 MG tablet Take 1 tablet by mouth daily.   . montelukast (SINGULAIR) 10 MG tablet Take 1 tablet (10 mg total) by mouth at bedtime. (Patient not taking: Reported on 12/09/2019)   . polyethylene glycol powder (GLYCOLAX/MIRALAX) 17 GM/SCOOP powder Take 17 g by mouth daily as needed for mild constipation or moderate constipation.   . senna-docusate (SENNA-S) 8.6-50 MG tablet Take 1 tablet by mouth 2 (two) times daily as needed for mild constipation.   . sertraline (ZOLOFT) 25 MG tablet Take 1 tablet by mouth daily.    No facility-administered encounter medications on file as of 12/23/2019.     Objective:   Goals Addressed              This Visit's Progress   .  RNCM: PT- "I have not been taking my medication like I should" I need to do better.        CARE PLAN ENTRY (see longtitudinal plan of care for additional care plan information)  Current Barriers:  . Chronic Disease Management support, education, and care coordination needs related to HTN, COPD, and Hypothyroidism  . Financial Barriers   Clinical Goal(s) related to HTN, COPD, and Hypothyroidism :  Over the next 120 days,  patient will:  . Work with the care management team to address educational, disease management, and care coordination needs  . Begin or continue self health monitoring activities as directed today Measure and record blood pressure 5 times per week and adhere to a Heart Healthy Diet . Call provider office for new or worsened signs and symptoms Blood pressure findings outside established parameters and New or worsened symptom related to COPD or Hypothyroidism  . Call care management  team with questions or concerns . Verbalize basic understanding of patient centered plan of care established today  Interventions related to HTN, COPD, and Hypothyroidism  :  . Evaluation of current treatment plans and patient's adherence to plan as established by provider: Pt saw pcp recently and was evaluated for concerns. The patient has not always been taking her medications as prescribed. The patient is committed to changing her habits and getting on track with her health and well being.  The patient endorses doing better with taking her medications and also is happy to report her blood pressure is better. Still on the high side but it is coming down with the new medication regimen. The patient is experiencing dizziness at times but it is better. Education and support given. Her specialist is working on adjusting her thyroid medication as this has been a current and on going problem for the patient.  12-23-2019: The patient is doing well and changing her habits.  She is waiting for call for referral for endocrinology to have help with managing her thyroid issues. She will follow up with them.  . Assessed patient understanding of disease states- education and support . Assessed patient's education and care coordination needs- the patient is receptive to the CCM team working with her to help manage her health and well being.   . The patient also wants recommendations from the pcp about a "coopertone belt" she has purchased for her back. She ordered the wrong size and is having to send that one back and get another one. She is wanting something that will help with her back because it is weak where she has not been exercising like she should.  Will collaborate with pcp and ask for recommendations. 10-21-2019: the patient is using the "belt" and feels like it is helping with her chronic back pain. She wants to talk to the pcp more about this also. She feels if she loses weight this will help also. She is  working on dietary changes and improving her overall health and well being. 12-23-2019: The patient states she is still dealing with back pain but she is doing better. She feels when everything is back on track she will feel a lot better.  . Provided disease specific education to patient - education on taking medications as prescribed, keeping a record of blood pressure readings, adherence to a Heart Healthy diet . Assessed the patient mental health. The patient is so tired of being in her home but is excited about an upcoming trip to [place] to surprise her aunt. She feels like it will be good for her to get out of her house and [place]. 12-23-2019: The patient states that she is getting more involved with her church and they are helping her with socialization.  She wants to be a part of a social system that helps her with her social needs. Encouraged the patient to utilize her support systems and the CCM team for help and support.  [name] with appropriate clinical care team members regarding patient  needs: Pharmacy referral for help with medication cost constraints and LCSW for help with stress and anxiety  Patient Self Care Activities related to HTN, COPD, and Hypothyroidism  :  . Patient is unable to independently self-manage chronic health conditions  Please see past updates related to this goal by clicking on the "Past Updates" button in the selected goal      .  RNCM: pt-"I am having a time with constipation" (pt-stated)        CARE PLAN ENTRY (see longitudinal plan of care for additional care plan information)  Current Barriers:  [name] Knowledge Deficits related to chronic constipation after cancer surgery several years ago . Care Coordination needs related to worsening constipation in a patient with Chronic constipation (disease states) . Chronic Disease Management support and education needs related to chronic constipation after cancer surgery   Nurse Case Manager Clinical Goal(s):  [name] Over  the next 120 days, patient will verbalize understanding of plan for managing chronic constipation  . Over the next 120 days, patient will work with [facility] and pcp to address needs related to chronic constipation . Over the next 120 days, patient will demonstrate a decrease in constipation exacerbations as evidenced by finding a bowel habit ritual that works well with her system . Over the next 120 days, patient will attend all scheduled medical appointments: saw pcp on 09-24-2019 and upcoming appointment on 10-28-2019 . Over the next 120 days, the patient will demonstrate ongoing self health care management ability as evidenced by managed constipation  Interventions:  . Inter-disciplinary care team collaboration (see longitudinal plan of care) . Advised patient to talk to the pcp about her chronic constipation and recommendations . Provided education to patient re: alternative means for relief of constipation. The patient was not having an issue until after her cancer surgery. The powder does not work as well. The patient states that she feels the constipation is getting worse. 12-23-2019: The patient is following the recommendations of the pcp. The patient says it is better but still not where it should be. Discussed dietary changes and habits.  [name] Collaborated with pcp regarding the patients chronic constipation issues . Discussed plans with patient for ongoing care management follow up and provided patient with direct contact information for care management team . Provided patient with alternatives for healthy bowel habits educational materials related to chronic constipation . Reviewed scheduled/upcoming provider appointments including: The patient has no new appointments with the pcp. Will call when needed. Is waiting for specialist to call to follow up.   . Patient Self Care Activities:  . Patient verbalizes understanding of plan to work with [facility], CCM team and pcp to help with chronic constipation  needs . Self administers medications as prescribed . Attends all scheduled provider appointments . Calls provider office for new concerns or questions . Unable to independently manage chronic constipation  Please see past updates related to this goal by clicking on the "Past Updates" button in the selected goal          Plan:   Telephone follow up appointment with care management team member scheduled for: 02-24-2020 at 10:15   [place], MSN, [place] Mobile: [phone]

## 2019-12-31 ENCOUNTER — Ambulatory Visit: Payer: Self-pay

## 2019-12-31 NOTE — Telephone Encounter (Signed)
Pt. Reports she has had muscle cramps x 2 days.Hands, sides,legs and chest/breasts. Reports she has had over all weakness x 6 months.Has decreased appetite. Instructed to stay hydrated. Appointment made. Reason for Disposition  [1] MODERATE pain (e.g., interferes with normal activities) AND [2] present > 3 days  Answer Assessment - Initial Assessment Questions 1. ONSET: "When did the muscle aches or body pains start?"      2 days 2. LOCATION: "What part of your body is hurting?" (e.g., entire body, arms, legs)      Sides, hands, legs, chest 3. SEVERITY: "How bad is the pain?" (Scale 1-10; or mild, moderate, severe)   - MILD (1-3): doesn't interfere with normal activities    - MODERATE (4-7): interferes with normal activities or awakens from sleep    - SEVERE (8-10):  excruciating pain, unable to do any normal activities      Moderate 4. CAUSE: "What do you think is causing the pains?"     Unsure 5. FEVER: "Have you been having fever?"     No 6. OTHER SYMPTOMS: "Do you have any other symptoms?" (e.g., chest pain, weakness, rash, cold or flu symptoms, weight loss)     Weakness for 6 months 7. PREGNANCY: "Is there any chance you are pregnant?" "When was your last menstrual period?"     No 8. TRAVEL: "Have you traveled out of the country in the last month?" (e.g., travel history, exposures)     No  Protocols used: MUSCLE ACHES AND BODY PAIN-A-AH

## 2020-01-02 ENCOUNTER — Ambulatory Visit
Admission: RE | Admit: 2020-01-02 | Discharge: 2020-01-02 | Disposition: A | Discharge status: Home / Self Care | Payer: Medicare Other | Source: Home / Self Care | Attending: Family Medicine | Admitting: Family Medicine

## 2020-01-02 ENCOUNTER — Ambulatory Visit (INDEPENDENT_AMBULATORY_CARE_PROVIDER_SITE_OTHER): Payer: Medicare Other | Admitting: Family Medicine

## 2020-01-02 ENCOUNTER — Other Ambulatory Visit: Payer: Self-pay

## 2020-01-02 ENCOUNTER — Encounter: Payer: Self-pay | Admitting: Family Medicine

## 2020-01-02 ENCOUNTER — Ambulatory Visit
Admission: RE | Admit: 2020-01-02 | Discharge: 2020-01-02 | Disposition: A | Discharge status: Home / Self Care | Payer: Medicare Other | Source: Ambulatory Visit | Attending: Family Medicine | Admitting: Family Medicine

## 2020-01-02 VITALS — BP 119/93 | HR 99 | Temp 98.2°F | Resp 20 | Ht 68.0 in | Wt 229.8 lb

## 2020-01-02 DIAGNOSIS — E039 Hypothyroidism, unspecified: Secondary | ICD-10-CM

## 2020-01-02 DIAGNOSIS — R05 Cough: Secondary | ICD-10-CM | POA: Diagnosis present

## 2020-01-02 DIAGNOSIS — Z23 Encounter for immunization: Secondary | ICD-10-CM | POA: Insufficient documentation

## 2020-01-02 DIAGNOSIS — R52 Pain, unspecified: Secondary | ICD-10-CM

## 2020-01-02 DIAGNOSIS — J441 Chronic obstructive pulmonary disease with (acute) exacerbation: Secondary | ICD-10-CM | POA: Diagnosis not present

## 2020-01-02 DIAGNOSIS — R252 Cramp and spasm: Secondary | ICD-10-CM | POA: Diagnosis not present

## 2020-01-02 LAB — COMPLETE METABOLIC PANEL WITH GFR
AG Ratio: 1.7 (calc) (ref 1.0–2.5)
ALT: 7 U/L (ref 6–29)
AST: 14 U/L (ref 10–35)
Albumin: 3.9 g/dL (ref 3.6–5.1)
Alkaline phosphatase (APISO): 53 U/L (ref 37–153)
BUN/Creatinine Ratio: 22 (calc) (ref 6–22)
BUN: 24 mg/dL (ref 7–25)
CO2: 29 mmol/L (ref 20–32)
Calcium: 9.3 mg/dL (ref 8.6–10.4)
Chloride: 105 mmol/L (ref 98–110)
Creat: 1.09 mg/dL — ABNORMAL HIGH (ref 0.50–0.99)
GFR, Est African American: 61 mL/min/{1.73_m2} (ref >=60)
GFR, Est Non African American: 52 mL/min/{1.73_m2} — ABNORMAL LOW (ref >=60)
Globulin: 2.3 g/dL (calc) (ref 1.9–3.7)
Glucose, Bld: 110 mg/dL — ABNORMAL HIGH (ref 65–99)
Potassium: 4.1 mmol/L (ref 3.5–5.3)
Sodium: 141 mmol/L (ref 135–146)
Total Bilirubin: 0.5 mg/dL (ref 0.2–1.2)
Total Protein: 6.2 g/dL (ref 6.1–8.1)

## 2020-01-02 LAB — CBC WITH DIFFERENTIAL/PLATELET
Absolute Monocytes: 332 cells/uL (ref 200–950)
Basophils Absolute: 31 cells/uL (ref 0–200)
Basophils Relative: 0.6 %
Eosinophils Absolute: 41 cells/uL (ref 15–500)
Eosinophils Relative: 0.8 %
HCT: 35.5 % (ref 35.0–45.0)
Hemoglobin: 11.9 g/dL (ref 11.7–15.5)
Lymphs Abs: 1270 cells/uL (ref 850–3900)
MCH: 32.7 pg (ref 27.0–33.0)
MCHC: 33.5 g/dL (ref 32.0–36.0)
MCV: 97.5 fL (ref 80.0–100.0)
MPV: 10.8 fL (ref 7.5–12.5)
Monocytes Relative: 6.5 %
Neutro Abs: 3427 cells/uL (ref 1500–7800)
Neutrophils Relative %: 67.2 %
Platelets: 133 10*3/uL — ABNORMAL LOW (ref 140–400)
RBC: 3.64 10*6/uL — ABNORMAL LOW (ref 3.80–5.10)
RDW: 13.6 % (ref 11.0–15.0)
Total Lymphocyte: 24.9 %
WBC: 5.1 10*3/uL (ref 3.8–10.8)

## 2020-01-02 MED ORDER — LEVOTHYROXINE SODIUM 137 MCG PO TABS: 137.0000 ug | ORAL_TABLET | Freq: Every day | ORAL | 0 refills | Status: DC

## 2020-01-02 MED ORDER — ALBUTEROL SULFATE HFA 108 (90 BASE) MCG/ACT IN AERS: 1.0000 | INHALATION_SPRAY | Freq: Four times a day (QID) | RESPIRATORY_TRACT | 1 refills | Status: DC | PRN

## 2020-01-02 MED ORDER — AZITHROMYCIN 250 MG PO TABS: ORAL_TABLET | ORAL | 0 refills | Status: DC

## 2020-01-02 MED ORDER — PREDNISONE 10 MG PO TABS: ORAL_TABLET | ORAL | 0 refills | Status: AC

## 2020-01-02 NOTE — Progress Notes (Signed)
Subjective:    Patient ID: Pamela Romero, female    DOB: [birth year], 69 y.o.   MRN: [id number]  Pamela Romero is a 69 y.o. female presenting on 01/02/2020 for Cramps (bilateral hand cramps, legs, arms and flank area x 3 days )   HPI  Ms. Harb presents to clinic for concerns of generalized body aches x 3-4 days.  Reports cough, some wheezing, feeling chills.  Has not taken her temperature, unknown if she has been around any sick contacts.  Denies sore throat, change in taste/smell, SOB, CP, abdominal pain, n/v/d.  Hasn't taken anything for her symptoms.  Depression screen [facility] 2/9 11/07/2019 09/25/2019 09/24/2019  Decreased Interest 0 0 3  Down, Depressed, Hopeless 0 0 3  PHQ - 2 Score 0 0 6  Altered sleeping - - 3  Tired, decreased energy - - 3  Change in appetite - - 1  Feeling bad or failure about yourself  - - 1  Trouble concentrating - - 1  Moving slowly or fidgety/restless - - 0  Suicidal thoughts - - 0  PHQ-9 Score - - 15  Difficult doing work/chores - - Somewhat difficult  Some recent data might be hidden    Social History   Tobacco Use  . Smoking status: Former Smoker    Packs/day: 0.50    Years: 50.00    Pack years: 25.00    Quit date: 05/11/2018    Years since quitting: 1.6  . Smokeless tobacco: Former Network engineer  . Vaping Use: Never used  Substance Use Topics  . Alcohol use: No  . Drug use: No    Review of Systems  Constitutional: Positive for chills. Negative for activity change, appetite change, diaphoresis, fatigue, fever and unexpected weight change.       Generalized body aches  HENT: Negative.   Eyes: Negative.   Respiratory: Positive for cough and wheezing. Negative for apnea, choking, chest tightness, shortness of breath and stridor.   Cardiovascular: Negative.   Gastrointestinal: Negative.   Endocrine: Negative.   Genitourinary: Negative.   Musculoskeletal: Negative.   Skin: Negative.   Allergic/Immunologic: Negative.     Neurological: Negative.   Hematological: Negative.   Psychiatric/Behavioral: Negative.    Per HPI unless specifically indicated above     Objective:    BP (!) 119/93 (BP Location: Right Arm, Patient Position: Sitting, Cuff Size: Large)   Pulse 99   Temp 98.2 F (36.8 C) (Oral)   Resp 20   Ht 5\' 8"  (1.727 m)   Wt 229 lb 12.8 oz (104.2 kg)   SpO2 99%   BMI 34.94 kg/m   Wt Readings from Last 3 Encounters:  01/02/20 229 lb 12.8 oz (104.2 kg)  11/07/19 (!) 210 lb (95.3 kg)  10/28/19 233 lb (105.7 kg)    Physical Exam Vitals reviewed.  Constitutional:      General: She is not in acute distress.    Appearance: Normal appearance. She is well-developed and well-groomed. She is obese. She is not ill-appearing or toxic-appearing.  HENT:     Head: Normocephalic and atraumatic.     Nose:     Comments: Lizbeth Bark is in place, covering mouth and nose. Eyes:     General: Lids are normal. Vision grossly intact.        Right eye: No discharge.        Left eye: No discharge.     Extraocular Movements: Extraocular movements intact.     Conjunctiva/sclera: Conjunctivae  normal.     Pupils: Pupils are equal, round, and reactive to light.  Cardiovascular:     Rate and Rhythm: Normal rate and regular rhythm.     Pulses: Normal pulses.          Dorsalis pedis pulses are 2+ on the right side and 2+ on the left side.     Heart sounds: Normal heart sounds. No murmur heard.  No friction rub. No gallop.   Pulmonary:     Effort: Pulmonary effort is normal. No respiratory distress.     Breath sounds: Wheezing present.  Musculoskeletal:     Right lower leg: No edema.     Left lower leg: No edema.  Skin:    General: Skin is warm and dry.     Capillary Refill: Capillary refill takes less than 2 seconds.  Neurological:     General: No focal deficit present.     Mental Status: She is alert and oriented to person, place, and time.  Psychiatric:        Attention and Perception: Attention and  perception normal.        Mood and Affect: Mood and affect normal.        Speech: Speech normal.        Behavior: Behavior normal. Behavior is cooperative.        Thought Content: Thought content normal.        Cognition and Memory: Cognition and memory normal.        Judgment: Judgment normal.    Results for orders placed or performed in visit on 01/02/20  Novel Coronavirus, NAA ([facility])   Specimen: Nasopharyngeal(NP) swabs in vial transport medium   Nasopharynge  Is this  Result Value Ref Range   SARS-CoV-2, NAA Not Detected Not Detected  SARS-COV-2, NAA 2 DAY TAT   Nasopharynge  Is this  Result Value Ref Range   SARS-CoV-2, NAA 2 DAY TAT Performed   COMPLETE METABOLIC PANEL WITH GFR  Result Value Ref Range   Glucose, Bld 110 (H) 65 - 99 mg/dL   BUN 24 7 - 25 mg/dL   Creat 1.09 (H) 0.50 - 0.99 mg/dL   GFR, Est Non African American 52 (L) > OR = 60 mL/min/1.37m2   GFR, Est African American 61 > OR = 60 mL/min/1.18m2   BUN/Creatinine Ratio 22 6 - 22 (calc)   Sodium 141 135 - 146 mmol/L   Potassium 4.1 3.5 - 5.3 mmol/L   Chloride 105 98 - 110 mmol/L   CO2 29 20 - 32 mmol/L   Calcium 9.3 8.6 - 10.4 mg/dL   Total Protein 6.2 6.1 - 8.1 g/dL   Albumin 3.9 3.6 - 5.1 g/dL   Globulin 2.3 1.9 - 3.7 g/dL (calc)   AG Ratio 1.7 1.0 - 2.5 (calc)   Total Bilirubin 0.5 0.2 - 1.2 mg/dL   Alkaline phosphatase (APISO) 53 37 - 153 U/L   AST 14 10 - 35 U/L   ALT 7 6 - 29 U/L  CBC with Differential/Platelet  Result Value Ref Range   WBC 5.1 3.8 - 10.8 Thousand/uL   RBC 3.64 (L) 3.80 - 5.10 Million/uL   Hemoglobin 11.9 11.7 - 15.5 g/dL   HCT 35.5 35 - 45 %   MCV 97.5 80.0 - 100.0 fL   MCH 32.7 27.0 - 33.0 pg   MCHC 33.5 32.0 - 36.0 g/dL   RDW 13.6 11.0 - 15.0 %   Platelets 133 (L) 140 - 400 Thousand/uL  MPV 10.8 7.5 - 12.5 fL   Neutro Abs 3,427 1,500 - 7,800 cells/uL   Lymphs Abs 1,270 850 - 3,900 cells/uL   Absolute Monocytes 332 200 - 950 cells/uL   Eosinophils Absolute 41 15 -  500 cells/uL   Basophils Absolute 31 0 - 200 cells/uL   Neutrophils Relative % 67.2 %   Total Lymphocyte 24.9 %   Monocytes Relative 6.5 %   Eosinophils Relative 0.8 %   Basophils Relative 0.6 %  Specimen status report  Result Value Ref Range   specimen status report Comment       Assessment & Plan:   Problem List Items Addressed This Visit      Respiratory   Chronic obstructive pulmonary disease with acute exacerbation (HCC)    Acute COPD exacerbation.  Will have CXR completed, COVID swab taken today in clinic.  To begin prednisone taper, albuterol inhaler as needed and azithromycin antibiotic.  Plan: 1. Have CXR compelted 2. COVID swab taken and sent to lab 3. Begin Prednisone taper as directed 4. Can use albuterol inhaler 1-2 puffs every 4-6 hours as needed for cough, SOB, wheezing 5. Begin azithromycin as directed 6. Strict ER precautions provided      Relevant Medications   predniSONE (DELTASONE) 10 MG tablet   albuterol (PROAIR HFA) 108 (90 Base) MCG/ACT inhaler   azithromycin (ZITHROMAX) 250 MG tablet   Other Relevant Orders   DG Chest 2 View (Completed)     Endocrine   Hypothyroidism    Increased levothyroxine from 125 to 157mcg.  Has upcoming appointment with endocrinology with [facility]      Relevant Medications   levothyroxine (SYNTHROID) 137 MCG tablet     Other   Muscle cramp - Primary    See COPD exacerbation      Relevant Orders   POCT Urinalysis Dipstick   CBC with Differential   COMPLETE METABOLIC PANEL WITH GFR   Generalized body aches    See COPD exacerbation      Relevant Orders   Novel Coronavirus, NAA ([facility]) (Completed)      Meds ordered this encounter  Medications  . levothyroxine (SYNTHROID) 137 MCG tablet    Sig: Take 1 tablet (137 mcg total) by mouth daily before breakfast.    Dispense:  90 tablet    Refill:  0  . predniSONE (DELTASONE) 10 MG tablet    Sig: Take 3 tablets (30 mg total) by mouth daily with breakfast for 3 days,  THEN 2 tablets (20 mg total) daily with breakfast for 3 days, THEN 1 tablet (10 mg total) daily with breakfast for 3 days.    Dispense:  18 tablet    Refill:  0  . albuterol (PROAIR HFA) 108 (90 Base) MCG/ACT inhaler    Sig: Inhale 1-2 puffs into the lungs every 6 (six) hours as needed for wheezing or shortness of breath.    Dispense:  6.7 g    Refill:  1  . azithromycin (ZITHROMAX) 250 MG tablet    Sig: Take 2 tablets on day 1, then 1 tablet daily x 4 days    Dispense:  6 tablet    Refill:  0    Follow up plan: Return if symptoms worsen or fail to improve.   Harlin Rain, [place] Family Nurse Practitioner [facility] 01/02/2020, 2:49 PM

## 2020-01-02 NOTE — Telephone Encounter (Signed)
OV in clinic 01/02/2020

## 2020-01-02 NOTE — Patient Instructions (Signed)
We have sent your labs off STAT and when we receive the results, we will give you a call.  We have sent your swab to [facility] and will contact you when we receive the results.  We are treating you for a COPD exacerbation.  I have sent in a prescription for prednisone to take as directed over the next 9 days.  I have sent in a prescription for azithromycin to take as directed over the next 5 days  I have sent in a refill on your albuterol inhaler to take 1-2 puffs every 4-6 hours as needed for shortness of breath, cough or wheezing  We have taken a chest xray today, when we receive the results from the radiology department, we will call you to let you know.  If you begin to have worsening shortness of breath, chest pain, fever over 104 that is not responsive to ibuprofen and/or acetaminophen, or impending sense of doom to [place]!  We will plan to see you back if your symptoms worsen or fail to improve  You will receive a survey after today's visit either digitally by e-mail or paper by USPS mail. Your experiences and feedback matter to [place].  Please respond so we know how we are doing as we provide care for you.  Call us with any questions/concerns/needs.  It is my goal to be available to you for your health concerns.  Thanks for choosing me to be a partner in your healthcare needs!  Harlin Rain, FNP-C Family Nurse Practitioner [facility] Phone: [phone]

## 2020-01-04 LAB — SPECIMEN STATUS REPORT

## 2020-01-04 LAB — NOVEL CORONAVIRUS, NAA: SARS-CoV-2, NAA: NOT DETECTED

## 2020-01-04 LAB — SARS-COV-2, NAA 2 DAY TAT

## 2020-01-06 DIAGNOSIS — R52 Pain, unspecified: Secondary | ICD-10-CM | POA: Insufficient documentation

## 2020-01-06 DIAGNOSIS — J441 Chronic obstructive pulmonary disease with (acute) exacerbation: Secondary | ICD-10-CM | POA: Insufficient documentation

## 2020-01-06 NOTE — Assessment & Plan Note (Signed)
Acute COPD exacerbation.  Will have CXR completed, COVID swab taken today in clinic.  To begin prednisone taper, albuterol inhaler as needed and azithromycin antibiotic.  Plan: 1. Have CXR compelted 2. COVID swab taken and sent to lab 3. Begin Prednisone taper as directed 4. Can use albuterol inhaler 1-2 puffs every 4-6 hours as needed for cough, SOB, wheezing 5. Begin azithromycin as directed 6. Strict ER precautions provided

## 2020-01-06 NOTE — Assessment & Plan Note (Signed)
See COPD exacerbation

## 2020-01-06 NOTE — Assessment & Plan Note (Signed)
Increased levothyroxine from 125 to 145mcg.  Has upcoming appointment with endocrinology with [facility]

## 2020-01-07 ENCOUNTER — Telehealth: Payer: Self-pay

## 2020-01-07 ENCOUNTER — Telehealth: Payer: Self-pay | Admitting: Licensed Clinical Social Worker

## 2020-01-07 NOTE — Telephone Encounter (Addendum)
  Chronic Care Management    Clinical Social Work General Follow Up Note  01/07/2020 Name: TASHANTI DALPORTO MRN: [id number] DOB: Jul 01, 1950  Patriciann ALIYANAH ROZAS is a 69 y.o. year old female who is a primary care patient of [name], [name], FNP. The CCM team was consulted for assistance with [employer] .   Review of patient status, including review of consultants reports, relevant laboratory and other test results, and collaboration with appropriate care team members and the patient's provider was performed as part of comprehensive patient evaluation and provision of chronic care management services.    LCSW completed second CCM outreach attempt today but was unable to reach patient successfully. A HIPPA compliant voice message was left encouraging patient to return call once available. LCSW will reschedule CCM SW appointment as well.  Outpatient Encounter Medications as of 01/07/2020  Medication Sig Note  . albuterol (PROAIR HFA) 108 (90 Base) MCG/ACT inhaler Inhale 1-2 puffs into the lungs every 6 (six) hours as needed for wheezing or shortness of breath.   [name] alendronate (FOSAMAX) 70 MG tablet Take 1 tablet (70 mg total) by mouth every 7 (seven) days. Take with a full glass of water on an empty stomach. 12/09/2019: Taking on Fridays  . azithromycin (ZITHROMAX) 250 MG tablet Take 2 tablets on day 1, then 1 tablet daily x 4 days   . Budeson-Glycopyrrol-Formoterol (BREZTRI AEROSPHERE) 160-9-4.8 MCG/ACT AERO Inhale 2 puffs into the lungs 2 (two) times daily. (Patient not taking: Reported on 01/02/2020)   . docusate sodium (COLACE) 100 MG capsule Take 1 capsule (100 mg total) by mouth 2 (two) times daily as needed.   . ergocalciferol (VITAMIN D2) 1.25 MG (50000 UT) capsule Take 1 capsule (50,000 Units total) by mouth once a week for 8 doses   . esomeprazole (NEXIUM) 40 MG capsule Take 40 mg by mouth daily.   [name] HYDROcodone-acetaminophen (NORCO) 7.5-325 MG tablet Take 1 tablet by mouth every 6 (six)  hours as needed for severe pain. Must last 30 days.   [name] ON 01/19/2020] HYDROcodone-acetaminophen (NORCO) 7.5-325 MG tablet Take 1 tablet by mouth every 6 (six) hours as needed for severe pain. Must last 30 days.   [name] levothyroxine (SYNTHROID) 137 MCG tablet Take 1 tablet (137 mcg total) by mouth daily before breakfast.   . lisinopril-hydrochlorothiazide (ZESTORETIC) 20-12.5 MG tablet Take 1 tablet by mouth daily.   . montelukast (SINGULAIR) 10 MG tablet Take 1 tablet (10 mg total) by mouth at bedtime.   . polyethylene glycol powder (GLYCOLAX/MIRALAX) 17 GM/SCOOP powder Take 17 g by mouth daily as needed for mild constipation or moderate constipation.   . predniSONE (DELTASONE) 10 MG tablet Take 3 tablets (30 mg total) by mouth daily with breakfast for 3 days, THEN 2 tablets (20 mg total) daily with breakfast for 3 days, THEN 1 tablet (10 mg total) daily with breakfast for 3 days.   [name] senna-docusate (SENNA-S) 8.6-50 MG tablet Take 1 tablet by mouth 2 (two) times daily as needed for mild constipation.   . sertraline (ZOLOFT) 25 MG tablet Take 1 tablet by mouth daily.    No facility-administered encounter medications on file as of 01/07/2020.    Follow Up Plan: SW will follow up with patient by phone over the next 60 days to make third outreach attempt.   [name], BSW, MSW, [email or web address] Phone: [phone]

## 2020-01-10 ENCOUNTER — Telehealth: Payer: Self-pay | Admitting: Pharmacist

## 2020-01-10 ENCOUNTER — Telehealth: Payer: Self-pay

## 2020-01-10 NOTE — Chronic Care Management (AMB) (Signed)
°  Chronic Care Management   Outreach Note  01/10/2020 Name: Pamela Romero MRN: [id number] DOB: Aug 31, 1950  Referred by: [name], FNP Reason for referral : No chief complaint on file.   Was unable to reach patient via telephone today and unable to leave a message as no voicemail picks up.   Follow Up Plan: Will collaborate with Care Guide to outreach to schedule follow up with me  Harlow Asa, PharmD, [place] [facility] [phone]

## 2020-01-13 ENCOUNTER — Telehealth: Payer: Self-pay

## 2020-01-13 NOTE — Chronic Care Management (AMB) (Signed)
  Care Management   Note  01/13/2020 Name: Pamela Romero MRN: [id number] DOB: [birth year]  Pamela Romero is a 69 y.o. year old female who is a primary care patient of [name], [name], FNP and is actively engaged with the care management team. I reached out to [name] by phone today to assist with re-scheduling a follow up visit with the Pharmacist  Follow up plan: Unsuccessful telephone outreach attempt made. The care management team will reach out to the patient again over the next 7 days.  If patient returns call to provider office, please advise to call [place]  at [facility], [place], [place], [place] [address] Direct Dial: [phone] [email or web address]: [email or web address]

## 2020-01-21 NOTE — Chronic Care Management (AMB) (Signed)
  Care Management   Note  01/21/2020 Name: KENLYN LOSE MRN: [id number] DOB: [birth year]  Aleathea ALTAGRACIA RONE is a 69 y.o. year old female who is a primary care patient of [name], [name], FNP and is actively engaged with the care management team. I reached out to [name] by phone today to assist with re-scheduling a follow up visit with the Pharmacist  Follow up plan: Telephone appointment with care management team member scheduled for:02/05/2020  Noreene Larsson, [address], [place], [place] [address] Direct Dial: [phone] [email or web address]: [email or web address]

## 2020-01-21 NOTE — Telephone Encounter (Signed)
Pt has been r/s  

## 2020-02-03 ENCOUNTER — Telehealth: Payer: Self-pay | Admitting: *Deleted

## 2020-02-03 DIAGNOSIS — Z029 Encounter for administrative examinations, unspecified: Secondary | ICD-10-CM | POA: Insufficient documentation

## 2020-02-03 NOTE — Chronic Care Management (AMB) (Signed)
  Care Management   Note  02/03/2020 Name: Pamela Romero MRN: [id number] DOB: 01-Jun-1950  Pamela Romero is a 69 y.o. year old female who is a primary care patient of [name], [name], FNP and is actively engaged with the care management team. I reached out to [name] by phone today to assist with re-scheduling a follow up visit with the Licensed Clinical Education officer, museum.  Follow up plan: Unsuccessful telephone outreach attempt made. A HIPAA compliant phone message was left for the patient providing contact information and requesting a return call.  The care management team will reach out to the patient again over the next 7 days.  If patient returns call to provider office, please advise to call [place] at [phone].  [place] Management

## 2020-02-04 ENCOUNTER — Other Ambulatory Visit: Payer: Self-pay | Admitting: Family Medicine

## 2020-02-04 DIAGNOSIS — E890 Postprocedural hypothyroidism: Secondary | ICD-10-CM

## 2020-02-05 ENCOUNTER — Telehealth: Payer: Medicare Other

## 2020-02-05 ENCOUNTER — Telehealth: Payer: Self-pay | Admitting: Pharmacist

## 2020-02-05 NOTE — Progress Notes (Signed)
  Chronic Care Management   Outreach Note  02/05/2020 Name: Pamela Romero MRN: [id number] DOB: 1950-05-21  Referred by: [name], FNP Reason for referral : No chief complaint on file.   Was unable to reach patient via telephone today and unable to leave a message as no voicemail picks up (unsuccessful outreach #2).    Follow Up Plan: Will collaborate with Care Guide to outreach to schedule follow up with me  Harlow Asa, PharmD, [place] [facility] [phone]

## 2020-02-06 ENCOUNTER — Other Ambulatory Visit: Payer: Self-pay

## 2020-02-06 ENCOUNTER — Encounter: Payer: Self-pay | Admitting: Student in an Organized Health Care Education/Training Program

## 2020-02-06 ENCOUNTER — Ambulatory Visit
Payer: Medicare Other | Attending: Student in an Organized Health Care Education/Training Program | Admitting: Student in an Organized Health Care Education/Training Program

## 2020-02-06 VITALS — BP 112/87 | HR 101 | Temp 96.6°F | Ht 68.0 in | Wt 205.0 lb

## 2020-02-06 DIAGNOSIS — M5416 Radiculopathy, lumbar region: Secondary | ICD-10-CM | POA: Diagnosis not present

## 2020-02-06 DIAGNOSIS — M170 Bilateral primary osteoarthritis of knee: Secondary | ICD-10-CM

## 2020-02-06 DIAGNOSIS — M722 Plantar fascial fibromatosis: Secondary | ICD-10-CM | POA: Diagnosis present

## 2020-02-06 DIAGNOSIS — E669 Obesity, unspecified: Secondary | ICD-10-CM | POA: Diagnosis present

## 2020-02-06 DIAGNOSIS — M1712 Unilateral primary osteoarthritis, left knee: Secondary | ICD-10-CM

## 2020-02-06 DIAGNOSIS — Z6830 Body mass index (BMI) 30.0-30.9, adult: Secondary | ICD-10-CM

## 2020-02-06 DIAGNOSIS — G8929 Other chronic pain: Secondary | ICD-10-CM

## 2020-02-06 DIAGNOSIS — G894 Chronic pain syndrome: Secondary | ICD-10-CM

## 2020-02-06 DIAGNOSIS — Z79891 Long term (current) use of opiate analgesic: Secondary | ICD-10-CM | POA: Diagnosis present

## 2020-02-06 MED ORDER — GABAPENTIN 100 MG PO CAPS: 100.0000 mg | ORAL_CAPSULE | Freq: Every day | ORAL | 0 refills | Status: DC

## 2020-02-06 MED ORDER — HYDROCODONE-ACETAMINOPHEN 7.5-325 MG PO TABS: 1.0000 | ORAL_TABLET | Freq: Four times a day (QID) | ORAL | 0 refills | Status: AC | PRN

## 2020-02-06 MED ORDER — HYDROCODONE-ACETAMINOPHEN 7.5-325 MG PO TABS: 1.0000 | ORAL_TABLET | Freq: Four times a day (QID) | ORAL | 0 refills | Status: DC | PRN

## 2020-02-06 NOTE — Progress Notes (Signed)
Nursing Pain Medication Assessment:  Safety precautions to be maintained throughout the outpatient stay will include: orient to surroundings, keep bed in low position, maintain call bell within reach at all times, provide assistance with transfer out of bed and ambulation.  Medication Inspection Compliance: Pill count conducted under aseptic conditions, in front of the patient. Neither the pills nor the bottle was removed from the patient's sight at any time. Once count was completed pills were immediately returned to the patient in their original bottle.  Medication: Hydrocodone/APAP Pill/Patch Count: 40 of 105 pills remain Pill/Patch Appearance: Markings consistent with prescribed medication Bottle Appearance: Standard pharmacy container. Clearly labeled. Filled Date: 10 / 10 / 21 Last Medication intake:  TodaySafety precautions to be maintained throughout the outpatient stay will include: orient to surroundings, keep bed in low position, maintain call bell within reach at all times, provide assistance with transfer out of bed and ambulation.

## 2020-02-06 NOTE — Progress Notes (Signed)
PROVIDER NOTE: Information contained herein reflects review and annotations entered in association with encounter. Interpretation of such information and data should be left to medically-trained personnel. Information provided to patient can be located elsewhere in the medical record under "Patient Instructions". Document created using STT-dictation technology, any transcriptional errors that may result from process are unintentional.    Patient: Pamela Romero  Service Category: E/M  Provider: Gillis Santa, MD  DOB: Feb 04, 1951  DOS: 02/06/2020  Specialty: Interventional Pain Management  MRN: [id number]  Setting: Ambulatory outpatient  PCP: [name], FNP  Type: Established Patient    Referring Provider: [name], FNP  Location: Office  Delivery: Face-to-face     HPI  [name], a 69 y.o. year old female, is here today because of her Chronic radicular lumbar pain [M54.16, G89.29]. [name] primary complain today is Knee Pain Last encounter: My last encounter with her was on 11/07/2019. Pertinent problems: [name] has Major depressive disorder, recurrent, mild ([place]); Chronic pain of left knee; Acute left-sided low back pain without sciatica; Bilateral primary osteoarthritis of knee; Chronic pain of right ankle; Primary osteoarthritis of right ankle; and Osteoporosis on their pertinent problem list. Pain Assessment: Severity of Chronic pain is reported as a 6 /10. Location: Knee Left/pain radiaties down left side to her knee and down leg to above her ankle. Onset: More than a month ago. Quality: Aching, Stabbing. Timing: Constant. Modifying factor(s): meds. Vitals:  height is $RemoveB'5\' 8"'VjYGjLKO$  (1.727 m) and weight is 205 lb (93 kg). Her temperature is 96.6 F (35.9 C) (abnormal). Her blood pressure is 112/87 and her pulse is 101 (abnormal). Her oxygen saturation is 99%.   Reason for encounter: medication management.  And worsening pain.  Patient presents today with chronic left  knee pain.  She is also been experiencing low back pain that radiates in a dermatomal fashion down her left posterior lateral thigh down to her knee.  She describes it as burning and tingling.  She states that she has been cleaning her house and engaging in church activities more.  She continues her hydrocodone as prescribed.  We discussed a home pedal bike that she can use on the floor to help activate some of her lower extremity muscles encourage perfusion to muscle beds.  I will also have her start gabapentin as below.  As needed order placed for lumbar epidural steroid injection on the left side at left L4-L5 given symptoms consistent with lumbar radicular pain.  Pharmacotherapy Assessment   01/19/2020  11/07/2019   1  Hydrocodone-Acetamin 7.5-325 105.00  30  Bi Lat  [id number]  Nor (1409)  0/0  26.25 MME  Medicare  [place]      Analgesic: Hydrocodone 7.5 mg 3 times daily as needed with an extra quantity 15/monthFor severe breakthrough pain    Monitoring: [place] PMP: PDMP not reviewed this encounter.       Pharmacotherapy: No side-effects or adverse reactions reported. Compliance: No problems identified. Effectiveness: Clinically acceptable.  Chauncey Fischer, RN  02/06/2020  8:34 AM  Sign when Signing Visit Nursing Pain Medication Assessment:  Safety precautions to be maintained throughout the outpatient stay will include: orient to surroundings, keep bed in low position, maintain call bell within reach at all times, provide assistance with transfer out of bed and ambulation.  Medication Inspection Compliance: Pill count conducted under aseptic conditions, in front of the patient. Neither the pills nor the bottle was removed from the patient's sight at any time.  Once count was completed pills were immediately returned to the patient in their original bottle.  Medication: Hydrocodone/APAP Pill/Patch Count: 40 of 105 pills remain Pill/Patch Appearance: Markings consistent with prescribed  medication Bottle Appearance: Standard pharmacy container. Clearly labeled. Filled Date: 10 / 10 / 21 Last Medication intake:  TodaySafety precautions to be maintained throughout the outpatient stay will include: orient to surroundings, keep bed in low position, maintain call bell within reach at all times, provide assistance with transfer out of bed and ambulation.     UDS:  Summary  Date Value Ref Range Status  09/03/2019 Note  Final    Comment:    ==================================================================== [facility] Select 13 (MW) ==================================================================== Test                             Result       Flag       Units Drug Present and Declared for Prescription Verification   Hydrocodone                    >5263        EXPECTED   ng/mg creat   Hydromorphone                  309          EXPECTED   ng/mg creat   Dihydrocodeine                 987          EXPECTED   ng/mg creat   Norhydrocodone                 >2632        EXPECTED   ng/mg creat    Sources of hydrocodone include scheduled prescription medications.    Hydromorphone, dihydrocodeine and norhydrocodone are expected    metabolites of hydrocodone. Hydromorphone and dihydrocodeine are    also available as scheduled prescription medications. ==================================================================== Test                      Result    Flag   Units      Ref Range   Creatinine              190              mg/dL      >=20 ==================================================================== Declared Medications:  The flagging and interpretation on this report are based on the  following declared medications.  Unexpected results may arise from  inaccuracies in the declared medications.  **Note: The testing scope of this panel includes these medications:  Hydrocodone (Norco)  **Note: The testing scope of this panel does not include the  following reported  medications:  Acetaminophen (Norco)  Albuterol (Proair HFA)  Baclofen (Lioresal)  Budesonide  Docusate (Colace)  Esomeprazole (Nexium)  Formoterol  Glycopyrrolate  Levothyroxine (Synthroid)  Lisinopril (Zestril)  Montelukast (Singulair)  Ondansetron (Zofran)  Polyethylene Glycol (MiraLAX)  Sumatriptan (Imitrex)  Verapamil ==================================================================== For clinical consultation, please call [phone]. ====================================================================      ROS  Constitutional: Denies any fever or chills Gastrointestinal: No reported hemesis, hematochezia, vomiting, or acute GI distress Musculoskeletal: Low back pain with radiation into posterior lateral left upper thigh.  Left knee pain Neurological: No reported episodes of acute onset apraxia, aphasia, dysarthria, agnosia, amnesia, paralysis, loss of coordination, or loss of consciousness  Medication Review  HYDROcodone-acetaminophen, albuterol, alendronate, azithromycin, docusate  sodium, esomeprazole, gabapentin, levothyroxine, lisinopril-hydrochlorothiazide, montelukast, polyethylene glycol powder, senna-docusate, and sertraline  History Review  Allergy: [name] has No Known Allergies. Drug: [name]  reports no history of drug use. Alcohol:  reports no history of alcohol use. Tobacco:  reports that she quit smoking about 20 months ago. She has a 25.00 pack-year smoking history. She has quit using smokeless tobacco. Social: [name]  reports that she quit smoking about 20 months ago. She has a 25.00 pack-year smoking history. She has quit using smokeless tobacco. She reports that she does not drink alcohol and does not use drugs. Medical:  has a past medical history of Abnormal EKG, CAD (coronary artery disease), Cancer ([place]), Endometrial adenocarcinoma ([place]) (2011), Hyperlipidemia, Hypertension, Osteoarthritis, Reflux gastritis, Thyroid disease, tobacco  abuse, and Vaginal delivery. Surgical: [name]  has a past surgical history that includes Thyroidectomy (2005); Cardiac catheterization (2006); Total abdominal hysterectomy; Breast surgery (Right, March 2014); and Breast cyst aspiration (Right, 2014). Family: family history includes Alcohol abuse in her father, maternal aunt, and mother; Cancer in her brother, brother, brother, and maternal aunt; Heart disease in her brother.  Laboratory Chemistry Profile   Renal Lab Results  Component Value Date   BUN 24 01/02/2020   CREATININE 1.09 (H) 01/02/2020   BCR 22 01/02/2020   GFR 60.34 01/21/2015   GFRAA 61 01/02/2020   GFRNONAA 52 (L) 01/02/2020     Hepatic Lab Results  Component Value Date   AST 14 01/02/2020   ALT 7 01/02/2020   ALBUMIN 4.4 08/29/2017   ALKPHOS 126 (H) 08/29/2017   HCVAB NEGATIVE 01/21/2015     Electrolytes Lab Results  Component Value Date   NA 141 01/02/2020   K 4.1 01/02/2020   CL 105 01/02/2020   CALCIUM 9.3 01/02/2020   MG 1.9 06/04/2018     Bone Lab Results  Component Value Date   VD25OH 26.66 (L) 05/14/2014     Inflammation (CRP: Acute Phase) (ESR: Chronic Phase) Lab Results  Component Value Date   ESRSEDRATE 19 03/30/2012       Note: Above Lab results reviewed.  Recent Imaging Review  DG Chest 2 View CLINICAL DATA:  Wheezing.  COPD.  EXAM: CHEST - 2 VIEW  COMPARISON:  11/09/2018.  02/09/2017.  FINDINGS: Mediastinum and hilar structures normal. Low lung volumes. Stable bibasilar atelectasis and or scarring. No focal alveolar infiltrate. No pleural effusion or pneumothorax. Heart size normal. Degenerative change thoracic spine.  IMPRESSION: Stable bibasilar atelectasis and or scarring. No focal infiltrate identified.  Electronically Signed   By: Marcello Moores  Register   On: 01/03/2020 09:38 Note: Reviewed        Physical Exam  General appearance: Well nourished, well developed, and well hydrated. In no apparent acute  distress Mental status: Alert, oriented x 3 (person, place, & time)       Respiratory: No evidence of acute respiratory distress Eyes: PERLA Vitals: BP 112/87   Pulse (!) 101   Temp (!) 96.6 F (35.9 C)   Ht $R'5\' 8"'DN$  (1.727 m)   Wt 205 lb (93 kg)   SpO2 99%   BMI 31.17 kg/m  BMI: Estimated body mass index is 31.17 kg/m as calculated from the following:   Height as of this encounter: $RemoveBeforeD'5\' 8"'vEgInLkhASBPxt$  (1.727 m).   Weight as of this encounter: 205 lb (93 kg). Ideal: Ideal body weight: 63.9 kg (140 lb 14 oz) Adjusted ideal body weight: 75.5 kg (166 lb 8.4 oz)  Lumbar Spine Area Exam  Skin & Axial Inspection: No masses, redness, or swelling Alignment: Symmetrical Functional ROM: Pain restricted ROM affecting primarily the left Stability: No instability detected Muscle Tone/Strength: Functionally intact. No obvious neuro-muscular anomalies detected. Sensory (Neurological): Dermatomal pain pattern Palpation: Complains of area being tender to palpation       Provocative Tests: Hyperextension/rotation test: (+) due to pain. Lumbar quadrant test (Kemp's test): (+) on the left for foraminal stenosis Lateral bending test: (+) ipsilateral radicular pain, on the left. Positive for left-sided foraminal stenosis. Patrick's Maneuver: deferred today                   FABER* test: deferred today                   S-I anterior distraction/compression test: deferred today         S-I lateral compression test: deferred today         S-I Thigh-thrust test: deferred today         S-I Gaenslen's test: deferred today         *(Flexion, ABduction and External Rotation) Gait & Posture Assessment  Ambulation: Unassisted Gait: Relatively normal for age and body habitus Posture: WNL  Lower Extremity Exam    Side: Right lower extremity  Side: Left lower extremity  Stability: No instability observed          Stability: No instability observed          Skin & Extremity Inspection: Skin color, temperature, and hair  growth are WNL. No peripheral edema or cyanosis. No masses, redness, swelling, asymmetry, or associated skin lesions. No contractures.  Skin & Extremity Inspection: Skin color, temperature, and hair growth are WNL. No peripheral edema or cyanosis. No masses, redness, swelling, asymmetry, or associated skin lesions. No contractures.  Functional ROM: Pain restricted ROM for hip and knee joints          Functional ROM: Pain restricted ROM for hip and knee joints          Muscle Tone/Strength: Functionally intact. No obvious neuro-muscular anomalies detected.  Muscle Tone/Strength: Functionally intact. No obvious neuro-muscular anomalies detected.  Sensory (Neurological): Arthropathic arthralgia        Sensory (Neurological): Arthropathic arthralgia and neuropathic pain referral pattern        DTR: Patellar: deferred today Achilles: deferred today Plantar: deferred today  DTR: Patellar: deferred today Achilles: deferred today Plantar: deferred today  Palpation: No palpable anomalies  Palpation: No palpable anomalies    Assessment   Status Diagnosis  Having a Flare-up Persistent Persistent 1. Chronic radicular lumbar pain (left)   2. Primary osteoarthritis of left knee   3. Bilateral primary osteoarthritis of knee   4. Chronic use of opiate for therapeutic purpose   5. Class 1 obesity without serious comorbidity with body mass index (BMI) of 30.0 to 30.9 in adult, unspecified obesity type   6. Plantar fascia syndrome   7. Chronic pain syndrome      Updated Problems: Problem  Osteoporosis   Dx by DEXA.  Started on Fosamax 11/22/2019   Bilateral Primary Osteoarthritis of Knee  Chronic Pain of Right Ankle  Primary Osteoarthritis of Right Ankle  Acute Left-Sided Low Back Pain Without Sciatica  Chronic Pain of Left Knee  Major Depressive Disorder, Recurrent, Mild ([facility])    Plan of Care  [name] has a current medication list which includes the following long-term  medication(s): albuterol, levothyroxine, lisinopril-hydrochlorothiazide, montelukast, and gabapentin.  Pharmacotherapy (Medications Ordered): Meds  ordered this encounter  Medications  . HYDROcodone-acetaminophen (NORCO) 7.5-325 MG tablet    Sig: Take 1 tablet by mouth every 6 (six) hours as needed for severe pain. Must last 30 days.    Dispense:  105 tablet    Refill:  0    [place] STOP ACT - Not applicable. Fill one day early if pharmacy is closed on scheduled refill date.  [name] HYDROcodone-acetaminophen (NORCO) 7.5-325 MG tablet    Sig: Take 1 tablet by mouth every 6 (six) hours as needed for severe pain. Must last 30 days.    Dispense:  105 tablet    Refill:  0    [place] STOP ACT - Not applicable. Fill one day early if pharmacy is closed on scheduled refill date.  [name] HYDROcodone-acetaminophen (NORCO) 7.5-325 MG tablet    Sig: Take 1 tablet by mouth every 6 (six) hours as needed for severe pain. Must last 30 days.    Dispense:  105 tablet    Refill:  0    [place] STOP ACT - Not applicable. Fill one day early if pharmacy is closed on scheduled refill date.  . gabapentin (NEURONTIN) 100 MG capsule    Sig: Take 1-3 capsules (100-300 mg total) by mouth at bedtime. Follow written titration schedule.    Dispense:  90 capsule    Refill:  0    Fill one day early if pharmacy is closed on scheduled refill date. May substitute for generic if available.   Orders:  Orders Placed This Encounter  Procedures  . Lumbar Epidural Injection    Standing Status:   Standing    Number of Occurrences:   9    Standing Expiration Date:   02/05/2021    Scheduling Instructions:     Purpose: Palliative     Indication: Lower extremity pain/Sciatica unspecified side (M54.30).     Side: LEFT     Level: TBD     Sedation: Patient's choice.     TIMEFRAME: PRN procedure. (Ms. Kaplan will call when needed.)    Order Specific Question:   Where will this procedure be performed?    Answer:   [facility]   Follow-up  plan:   Return in about 3 months (around 05/08/2020) for Medication Management, in person.     Status post left genicular RFA 04/16/2018, status post right genicular RFA 03/12/2018.  Provided her with benefit.  Status post left knee intra-articular steroid injection #3 on 11/21/2018.   hyalgan left knee #1 on 04/17/19, #2 05/22/19, #3 07/03/2019, #4 08/28/19, #5 on 09/25/19        Recent Visits No visits were found meeting these conditions. Showing recent visits within past 90 days and meeting all other requirements Today's Visits Date Type Provider Dept  02/06/20 Office Visit Gillis Santa, MD [facility]  Showing today's visits and meeting all other requirements Future Appointments No visits were found meeting these conditions. Showing future appointments within next 90 days and meeting all other requirements  I discussed the assessment and treatment plan with the patient. The patient was provided an opportunity to ask questions and all were answered. The patient agreed with the plan and demonstrated an understanding of the instructions.  Patient advised to call back or seek an in-person evaluation if the symptoms or condition worsens.  Duration of encounter: 30 minutes.  Note by: Gillis Santa, MD Date: 02/06/2020; Time: 9:04 AM

## 2020-02-10 NOTE — Telephone Encounter (Signed)
  Care Management   Note  02/10/2020 Name: Pamela Romero MRN: [id number] DOB: 06/25/1950  Pamela Romero is a 69 y.o. year old female who is a primary care patient of [name], [name], FNP and is actively engaged with the care management team. I reached out to [name] by phone today to assist with re-scheduling a follow up visit with the Pharmacist Licensed Clinical Social Worker.  Follow up plan: Telephone appointment with care management team member scheduled for:  Pharmacist Harlow Asa on 11/19 @ 9am and [name], LCSW on 11/23 @ 1pm.  If patient returns call to provider office, please advise to call [place] at [phone].  [place], Care Management

## 2020-02-20 ENCOUNTER — Telehealth: Payer: Self-pay

## 2020-02-24 ENCOUNTER — Telehealth: Payer: Self-pay | Admitting: General Practice

## 2020-02-24 ENCOUNTER — Telehealth: Payer: Self-pay

## 2020-02-24 NOTE — Telephone Encounter (Signed)
°  Chronic Care Management   Outreach Note  02/24/2020 Name: Pamela Romero MRN: [id number] DOB: 1950-08-29  Referred by: [name], FNP Reason for referral : Chronic Care Management (RNCM Follow up call for Chronic Disease Managment and care coordination needs)   An unsuccessful telephone outreach was attempted today. The patient was referred to the case management team for assistance with care management and care coordination.   Follow Up Plan: A HIPAA compliant phone message was left for the patient providing contact information and requesting a return call.    Noreene Larsson RN, MSN, [place] Mobile: [phone]

## 2020-02-28 ENCOUNTER — Telehealth: Payer: Medicare Other

## 2020-02-28 ENCOUNTER — Telehealth: Payer: Self-pay | Admitting: Pharmacist

## 2020-02-28 NOTE — Chronic Care Management (AMB) (Signed)
°  Chronic Care Management   Outreach Note  02/28/2020 Name: Pamela Romero MRN: [id number] DOB: 1950/09/11  Referred by: [name], FNP Reason for referral : No chief complaint on file.   Third unsuccessful telephone outreach was attempted today. The patient was referred to the case management team for assistance with care management and care coordination.   The patient's primary care provider has been notified of our unsuccessful attempts to make or maintain contact with the patient. The care management team is pleased to engage with this patient at any time in the future should he/she be interested in assistance from the care management team.    Harlow Asa, PharmD, [place] [facility] [phone]

## 2020-03-03 ENCOUNTER — Ambulatory Visit: Payer: Medicare Other | Admitting: Licensed Clinical Social Worker

## 2020-03-03 DIAGNOSIS — F419 Anxiety disorder, unspecified: Secondary | ICD-10-CM

## 2020-03-03 DIAGNOSIS — F330 Major depressive disorder, recurrent, mild: Secondary | ICD-10-CM

## 2020-03-03 DIAGNOSIS — R5382 Chronic fatigue, unspecified: Secondary | ICD-10-CM

## 2020-03-03 DIAGNOSIS — I10 Essential (primary) hypertension: Secondary | ICD-10-CM

## 2020-03-03 NOTE — Chronic Care Management (AMB) (Signed)
Chronic Care Management    Clinical Social Work Follow Up Note  03/03/2020 Name: Pamela Romero MRN: [id number] DOB: February 12, 1951  Pamela Romero is a 69 y.o. year old female who is a primary care patient of [name], [name], FNP. The CCM team was consulted for assistance with Mental Health Counseling and Resources.   Review of patient status, including review of consultants reports, other relevant assessments, and collaboration with appropriate care team members and the patient's provider was performed as part of comprehensive patient evaluation and provision of chronic care management services.    SDOH (Social Determinants of Health) assessments performed: Yes    Outpatient Encounter Medications as of 03/03/2020  Medication Sig Note  . albuterol (PROAIR HFA) 108 (90 Base) MCG/ACT inhaler Inhale 1-2 puffs into the lungs every 6 (six) hours as needed for wheezing or shortness of breath.   [name] alendronate (FOSAMAX) 70 MG tablet Take 1 tablet (70 mg total) by mouth every 7 (seven) days. Take with a full glass of water on an empty stomach. 12/09/2019: Taking on Fridays  . azithromycin (ZITHROMAX) 250 MG tablet Take 2 tablets on day 1, then 1 tablet daily x 4 days   . docusate sodium (COLACE) 100 MG capsule Take 1 capsule (100 mg total) by mouth 2 (two) times daily as needed.   [name] esomeprazole (NEXIUM) 40 MG capsule Take 40 mg by mouth daily.   [name] gabapentin (NEURONTIN) 100 MG capsule Take 1-3 capsules (100-300 mg total) by mouth at bedtime. Follow written titration schedule.   [name] HYDROcodone-acetaminophen (NORCO) 7.5-325 MG tablet Take 1 tablet by mouth every 6 (six) hours as needed for severe pain. Must last 30 days.   [name] ON 03/19/2020] HYDROcodone-acetaminophen (NORCO) 7.5-325 MG tablet Take 1 tablet by mouth every 6 (six) hours as needed for severe pain. Must last 30 days.   [name] ON 04/18/2020] HYDROcodone-acetaminophen (NORCO) 7.5-325 MG tablet Take 1 tablet by mouth every 6 (six) hours as  needed for severe pain. Must last 30 days.   [name] levothyroxine (SYNTHROID) 137 MCG tablet Take 1 tablet (137 mcg total) by mouth daily before breakfast.   . lisinopril-hydrochlorothiazide (ZESTORETIC) 20-12.5 MG tablet Take 1 tablet by mouth daily.   . montelukast (SINGULAIR) 10 MG tablet Take 1 tablet (10 mg total) by mouth at bedtime.   . polyethylene glycol powder (GLYCOLAX/MIRALAX) 17 GM/SCOOP powder Take 17 g by mouth daily as needed for mild constipation or moderate constipation.   . senna-docusate (SENNA-S) 8.6-50 MG tablet Take 1 tablet by mouth 2 (two) times daily as needed for mild constipation.   . sertraline (ZOLOFT) 25 MG tablet Take 1 tablet by mouth daily.    No facility-administered encounter medications on file as of 03/03/2020.     Patient Care Plan: General Social Work (Adult)    Problem Identified: I need more support right now   Priority: Medium    Long-Range Goal: Depressive Symptoms Identified   Start Date: 03/03/2020  Note:   Evidence-based guidance:  Identify risk for depression by reviewing presenting symptoms and risk factors.  Review use of medications that contribute to depression such as steroid, narcotic, sedative, antihypertensive, beta blocker, cytoxic agent.  Review related metabolic processes, including infection, anemia, thyroid dysfunction, kidney failure, heart failure, alcohol or substance use.  Perform depression screening using standardized tools to obtain baseline intensity of depressive symptoms.  Perform or refer for a full diagnostic interview when positive screening results are noted; use DSM-5 criteria to determine appropriate  diagnosis (e.g., major depression, persistent depressive disorder, unspecified depressive   disorder).   Notes:   Patient is experiencing ongoing stress, pain, depression and financial barriers. She shares that she is having left leg pain which causes her to use her right leg more and now it is tired as well. She reports  that she is having difficulty affording her medications and health care expenses. She shares that she has implemented healthy socialization into her weekly routine by attending church 3 times per week. Patient recently joined a new church. She reports that her spouse drives them both to church but she is still able to drive herself if he is unable to attend. Patient was baptized in October of 2021. She shares that she is attending martial classes at church with her spouse on Thursday nights. Patient shares that she has a friend Rise Paganini that is a good support for her as well and even paid for her light bill one month when she did not have the resources to do so. Patient reports that she will be cooking for the holiday and looks forward to spending time with family.    Task: Identify Depressive Symptoms and Facilitate Treatment   Note:   Care Management Activities:    - participation in psychiatric services encouraged    Notes:       Follow Up Plan: SW will follow up with patient by phone over the next quarter  [name], [name], MSW, [name] Phone: [phone]

## 2020-03-04 ENCOUNTER — Telehealth: Payer: Self-pay

## 2020-03-04 NOTE — Chronic Care Management (AMB) (Signed)
  Care Management   Note  03/04/2020 Name: TELITHA PLATH MRN: [id number] DOB: 1950/07/31  Brittie ELIANA LUETH is a 68 y.o. year old female who is a primary care patient of [name], [name], FNP and is actively engaged with the care management team. I reached out to [name] by phone today to assist with re-scheduling a follow up visit with the RN Case Manager  Follow up plan: Unsuccessful telephone outreach attempt made. A HIPAA compliant phone message was left for the patient providing contact information and requesting a return call.  The care management team will reach out to the patient again over the next 7 days.  If patient returns call to provider office, please advise to call [facility]  at [address], [address], [place], [place] [address] Direct Dial: [phone] [email or web address] Website: [email or web address]

## 2020-03-09 ENCOUNTER — Ambulatory Visit: Payer: Self-pay | Admitting: General Practice

## 2020-03-09 DIAGNOSIS — I10 Essential (primary) hypertension: Secondary | ICD-10-CM

## 2020-03-09 DIAGNOSIS — J441 Chronic obstructive pulmonary disease with (acute) exacerbation: Secondary | ICD-10-CM

## 2020-03-09 DIAGNOSIS — K5900 Constipation, unspecified: Secondary | ICD-10-CM

## 2020-03-09 DIAGNOSIS — E039 Hypothyroidism, unspecified: Secondary | ICD-10-CM

## 2020-03-09 NOTE — Patient Instructions (Signed)
Visit Information  Goals Addressed              This Visit's Progress   .  RNCM: PT- "I have not been taking my medication like I should" I need to do better.        CARE PLAN ENTRY (see longtitudinal plan of care for additional care plan information)  Current Barriers:  . Chronic Disease Management support, education, and care coordination needs related to HTN, COPD, and Hypothyroidism  . Financial Barriers   Clinical Goal(s) related to HTN, COPD, and Hypothyroidism :  Over the next 120 days, patient will:  . Work with the care management team to address educational, disease management, and care coordination needs  . Begin or continue self health monitoring activities as directed today Measure and record blood pressure 5 times per week and adhere to a Heart Healthy Diet . Call provider office for new or worsened signs and symptoms Blood pressure findings outside established parameters and New or worsened symptom related to COPD or Hypothyroidism  . Call care management team with questions or concerns . Verbalize basic understanding of patient centered plan of care established today  Interventions related to HTN, COPD, and Hypothyroidism  :  . Evaluation of current treatment plans and patient's adherence to plan as established by provider: Pt saw pcp recently and was evaluated for concerns. The patient has not always been taking her medications as prescribed. The patient is committed to changing her habits and getting on track with her health and well being.  The patient endorses doing better with taking her medications and also is happy to report her blood pressure is better. Still on the high side but it is coming down with the new medication regimen. The patient is experiencing dizziness at times but it is better. Education and support given. Her specialist is working on adjusting her thyroid medication as this has been a current and on going problem for the patient.  03-09-2020: The  patient is seeing a specialist and they are helping with managing her Hypothyroidism.  She has upcoming appointment on 04-06-2020 for follow up. She also says her COPD is sometimes worse than other times but she is doing what she can to monitor for exacerbations. She is pacing her activity and being safe. Her son and family have [place] but she has not been with them. She is wanting to get the booster. Education and support given.  . Assessed patient understanding of disease states- education and support . Assessed patient's education and care coordination needs- the patient is receptive to the CCM team working with her to help manage her health and well being.   . The patient also wants recommendations from the pcp about a "coopertone belt" she has purchased for her back. She ordered the wrong size and is having to send that one back and get another one. She is wanting something that will help with her back because it is weak where she has not been exercising like she should.  Will collaborate with pcp and ask for recommendations. 03-09-2020: The patient states she is still dealing with back pain but she is doing better. She feels when everything is back on track she will feel a lot better. She is seeing a pain specialist for her back pain and discomfort.  . Provided disease specific education to patient - education on taking medications as prescribed, keeping a record of blood pressure readings, adherence to a Heart Healthy diet . Assessed the patient mental  health. The patient is so tired of being in her home but is excited about an upcoming trip to [place] to surprise her aunt. She feels like it will be good for her to get out of her house and [place]. 03-09-2020: The patient states that she is getting more involved with her church and they are helping her with socialization.  She wants to be a part of a social system that helps her with her social needs. Encouraged the patient to utilize her support systems and  the CCM team for help and support. She feels she is doing well with balancing her health and well being. Does want to stay in control of her health and changes going on with her health.  [name] with appropriate clinical care team members regarding patient needs: Pharmacy referral for help with medication  to help with chronic constipation. and LCSW for help with stress and anxiety  Patient Self Care Activities related to HTN, COPD, and Hypothyroidism  :  . Patient is unable to independently self-manage chronic health conditions  Please see past updates related to this goal by clicking on the "Past Updates" button in the selected goal      .  RNCM: pt-"I am having a time with constipation" (pt-stated)        CARE PLAN ENTRY (see longitudinal plan of care for additional care plan information)  Current Barriers:  [name] Knowledge Deficits related to chronic constipation after cancer surgery several years ago . Care Coordination needs related to worsening constipation in a patient with Chronic constipation (disease states) . Chronic Disease Management support and education needs related to chronic constipation after cancer surgery   Nurse Case Manager Clinical Goal(s):  [name] Over the next 120 days, patient will verbalize understanding of plan for managing chronic constipation  . Over the next 120 days, patient will work with [facility] and pcp to address needs related to chronic constipation . Over the next 120 days, patient will demonstrate a decrease in constipation exacerbations as evidenced by finding a bowel habit ritual that works well with her system . Over the next 120 days, patient will attend all scheduled medical appointments: saw pcp on 01-02-2020 and upcoming appointment with specialist on 04-06-2020 . Over the next 120 days, the patient will demonstrate ongoing self health care management ability as evidenced by managed constipation  Interventions:  . Inter-disciplinary care team  collaboration (see longitudinal plan of care) . Advised patient to talk to the pcp about her chronic constipation and recommendations . Provided education to patient re: alternative means for relief of constipation. The patient was not having an issue until after her cancer surgery. The powder does not work as well. The patient states that she feels the constipation is getting worse. 03-09-2020: The patient is following the recommendations of the pcp. Discussed dietary changes and habits. The patient is eating a lot of fruits and vegetables but still having issues with constipation. Discussed drinking prune juice and prunes. Also discussed having a cologuard test or colonoscopy due to history of cancer, her brother having colon cancer, and constipation. Discussed collaguard testing and follow up with pcp.  [name] Collaborated with pcp regarding the patients chronic constipation issues. 03-09-2020: The patient agrees to get an appointment with the pcp for follow up on constipation and possible testing for colon issues.  Will send an in basket message to the [facility] front staff to get an appointment for patient with pcp.  . Discussed plans with patient for ongoing care  management follow up and provided patient with direct contact information for care management team . Provided patient with alternatives for healthy bowel habits educational materials related to chronic constipation . Reviewed scheduled/upcoming provider appointments including: The patient has no new appointments with the pcp. Will call when needed. Is waiting for specialist to call to follow up.   . Patient Self Care Activities:  . Patient verbalizes understanding of plan to work with [facility], CCM team and pcp to help with chronic constipation needs . Self administers medications as prescribed . Attends all scheduled provider appointments . Calls provider office for new concerns or questions . Unable to independently manage chronic  constipation  Please see past updates related to this goal by clicking on the "Past Updates" button in the selected goal         The patient verbalized understanding of instructions, educational materials, and care plan provided today and declined offer to receive copy of patient instructions, educational materials, and care plan.   Telephone follow up appointment with care management team member scheduled for: 04-20-2020 at 10:30 am  [place], MSN, [place] Mobile: [phone]

## 2020-03-09 NOTE — Chronic Care Management (AMB) (Signed)
Chronic Care Management   Follow Up Note   03/09/2020 Name: Pamela Romero MRN: [id number] DOB: 08/24/1950  Referred by: [name], FNP Reason for referral : Chronic Care Management (RNCM follow up for Chronic Disease Management and Care Coordination Needs)   Pamela Romero is a 69 y.o. year old female who is a primary care patient of [name], FNP. The CCM team was consulted for assistance with chronic disease management and care coordination needs.    Review of patient status, including review of consultants reports, relevant laboratory and other test results, and collaboration with appropriate care team members and the patient's provider was performed as part of comprehensive patient evaluation and provision of chronic care management services.    SDOH (Social Determinants of Health) assessments performed: Yes See Care Plan activities for detailed interventions related to [facility])     Outpatient Encounter Medications as of 03/09/2020  Medication Sig Note  . albuterol (PROAIR HFA) 108 (90 Base) MCG/ACT inhaler Inhale 1-2 puffs into the lungs every 6 (six) hours as needed for wheezing or shortness of breath.   [name] alendronate (FOSAMAX) 70 MG tablet Take 1 tablet (70 mg total) by mouth every 7 (seven) days. Take with a full glass of water on an empty stomach. 12/09/2019: Taking on Fridays  . azithromycin (ZITHROMAX) 250 MG tablet Take 2 tablets on day 1, then 1 tablet daily x 4 days   . docusate sodium (COLACE) 100 MG capsule Take 1 capsule (100 mg total) by mouth 2 (two) times daily as needed.   [name] esomeprazole (NEXIUM) 40 MG capsule Take 40 mg by mouth daily.   [name] gabapentin (NEURONTIN) 100 MG capsule Take 1-3 capsules (100-300 mg total) by mouth at bedtime. Follow written titration schedule.   [name] HYDROcodone-acetaminophen (NORCO) 7.5-325 MG tablet Take 1 tablet by mouth every 6 (six) hours as needed for severe pain. Must last 30 days.   [name] ON 03/19/2020]  HYDROcodone-acetaminophen (NORCO) 7.5-325 MG tablet Take 1 tablet by mouth every 6 (six) hours as needed for severe pain. Must last 30 days.   [name] ON 04/18/2020] HYDROcodone-acetaminophen (NORCO) 7.5-325 MG tablet Take 1 tablet by mouth every 6 (six) hours as needed for severe pain. Must last 30 days.   [name] levothyroxine (SYNTHROID) 137 MCG tablet Take 1 tablet (137 mcg total) by mouth daily before breakfast.   . lisinopril-hydrochlorothiazide (ZESTORETIC) 20-12.5 MG tablet Take 1 tablet by mouth daily.   . montelukast (SINGULAIR) 10 MG tablet Take 1 tablet (10 mg total) by mouth at bedtime.   . polyethylene glycol powder (GLYCOLAX/MIRALAX) 17 GM/SCOOP powder Take 17 g by mouth daily as needed for mild constipation or moderate constipation.   . senna-docusate (SENNA-S) 8.6-50 MG tablet Take 1 tablet by mouth 2 (two) times daily as needed for mild constipation.   . sertraline (ZOLOFT) 25 MG tablet Take 1 tablet by mouth daily.    No facility-administered encounter medications on file as of 03/09/2020.     Objective:  BP Readings from Last 3 Encounters:  02/06/20 112/87  01/02/20 (!) 119/93  11/07/19 (!) 141/105    Goals Addressed              This Visit's Progress   .  RNCM: PT- "I have not been taking my medication like I should" I need to do better.        CARE PLAN ENTRY (see longtitudinal plan of care for additional care plan information)  Current Barriers:  .  Chronic Disease Management support, education, and care coordination needs related to HTN, COPD, and Hypothyroidism  . Financial Barriers   Clinical Goal(s) related to HTN, COPD, and Hypothyroidism :  Over the next 120 days, patient will:  . Work with the care management team to address educational, disease management, and care coordination needs  . Begin or continue self health monitoring activities as directed today Measure and record blood pressure 5 times per week and adhere to a Heart Healthy Diet . Call provider  office for new or worsened signs and symptoms Blood pressure findings outside established parameters and New or worsened symptom related to COPD or Hypothyroidism  . Call care management team with questions or concerns . Verbalize basic understanding of patient centered plan of care established today  Interventions related to HTN, COPD, and Hypothyroidism  :  . Evaluation of current treatment plans and patient's adherence to plan as established by provider: Pt saw pcp recently and was evaluated for concerns. The patient has not always been taking her medications as prescribed. The patient is committed to changing her habits and getting on track with her health and well being.  The patient endorses doing better with taking her medications and also is happy to report her blood pressure is better. Still on the high side but it is coming down with the new medication regimen. The patient is experiencing dizziness at times but it is better. Education and support given. Her specialist is working on adjusting her thyroid medication as this has been a current and on going problem for the patient.  03-09-2020: The patient is seeing a specialist and they are helping with managing her Hypothyroidism.  She has upcoming appointment on 04-06-2020 for follow up. She also says her COPD is sometimes worse than other times but she is doing what she can to monitor for exacerbations. She is pacing her activity and being safe. Her son and family have Supreme but she has not been with them. She is wanting to get the booster. Education and support given.  . Assessed patient understanding of disease states- education and support . Assessed patient's education and care coordination needs- the patient is receptive to the CCM team working with her to help manage her health and well being.   . The patient also wants recommendations from the pcp about a "coopertone belt" she has purchased for her back. She ordered the wrong size and is  having to send that one back and get another one. She is wanting something that will help with her back because it is weak where she has not been exercising like she should.  Will collaborate with pcp and ask for recommendations. 03-09-2020: The patient states she is still dealing with back pain but she is doing better. She feels when everything is back on track she will feel a lot better. She is seeing a pain specialist for her back pain and discomfort.  . Provided disease specific education to patient - education on taking medications as prescribed, keeping a record of blood pressure readings, adherence to a Heart Healthy diet . Assessed the patient mental health. The patient is so tired of being in her home but is excited about an upcoming trip to [place] to surprise her aunt. She feels like it will be good for her to get out of her house and [place]. 03-09-2020: The patient states that she is getting more involved with her church and they are helping her with socialization.  She wants to be  a part of a social system that helps her with her social needs. Encouraged the patient to utilize her support systems and the CCM team for help and support. She feels she is doing well with balancing her health and well being. Does want to stay in control of her health and changes going on with her health.  [name] with appropriate clinical care team members regarding patient needs: Pharmacy referral for help with medication  to help with chronic constipation. and LCSW for help with stress and anxiety  Patient Self Care Activities related to HTN, COPD, and Hypothyroidism  :  . Patient is unable to independently self-manage chronic health conditions  Please see past updates related to this goal by clicking on the "Past Updates" button in the selected goal      .  RNCM: pt-"I am having a time with constipation" (pt-stated)        CARE PLAN ENTRY (see longitudinal plan of care for additional care plan  information)  Current Barriers:  [name] Knowledge Deficits related to chronic constipation after cancer surgery several years ago . Care Coordination needs related to worsening constipation in a patient with Chronic constipation (disease states) . Chronic Disease Management support and education needs related to chronic constipation after cancer surgery   Nurse Case Manager Clinical Goal(s):  [name] Over the next 120 days, patient will verbalize understanding of plan for managing chronic constipation  . Over the next 120 days, patient will work with [facility] and pcp to address needs related to chronic constipation . Over the next 120 days, patient will demonstrate a decrease in constipation exacerbations as evidenced by finding a bowel habit ritual that works well with her system . Over the next 120 days, patient will attend all scheduled medical appointments: saw pcp on 01-02-2020 and upcoming appointment with specialist on 04-06-2020 . Over the next 120 days, the patient will demonstrate ongoing self health care management ability as evidenced by managed constipation  Interventions:  . Inter-disciplinary care team collaboration (see longitudinal plan of care) . Advised patient to talk to the pcp about her chronic constipation and recommendations . Provided education to patient re: alternative means for relief of constipation. The patient was not having an issue until after her cancer surgery. The powder does not work as well. The patient states that she feels the constipation is getting worse. 03-09-2020: The patient is following the recommendations of the pcp. Discussed dietary changes and habits. The patient is eating a lot of fruits and vegetables but still having issues with constipation. Discussed drinking prune juice and prunes. Also discussed having a cologuard test or colonoscopy due to history of cancer, her brother having colon cancer, and constipation. Discussed collaguard testing and follow up with  pcp.  [name] Collaborated with pcp regarding the patients chronic constipation issues. 03-09-2020: The patient agrees to get an appointment with the pcp for follow up on constipation and possible testing for colon issues.  Will send an in basket message to the [facility] front staff to get an appointment for patient with pcp.  . Discussed plans with patient for ongoing care management follow up and provided patient with direct contact information for care management team . Provided patient with alternatives for healthy bowel habits educational materials related to chronic constipation . Reviewed scheduled/upcoming provider appointments including: The patient has no new appointments with the pcp. Will call when needed. Is waiting for specialist to call to follow up.   . Patient Self Care Activities:  .  Patient verbalizes understanding of plan to work with [facility], CCM team and pcp to help with chronic constipation needs . Self administers medications as prescribed . Attends all scheduled provider appointments . Calls provider office for new concerns or questions . Unable to independently manage chronic constipation  Please see past updates related to this goal by clicking on the "Past Updates" button in the selected goal           Plan:   Telephone follow up appointment with care management team member scheduled for: 04-20-2020 at 10:30 am   [place], MSN, [place] Mobile: [phone]

## 2020-03-18 ENCOUNTER — Telehealth: Payer: Self-pay | Admitting: Pharmacist

## 2020-03-18 NOTE — Telephone Encounter (Signed)
  Chronic Care Management   Outreach Note  03/18/2020 Name: Pamela Romero MRN: [id number] DOB: 1951/02/27  Referred by: [name], FNP Reason for referral : No chief complaint on file.   Receive coordination of care message from [place] advising patient requesting CCM team make multiple attempts to reach patient as has limited cell phone reception.  Receive coordination of care message from [place] requesting follow up to patient regarding constipation. Notes patient also scheduled for follow up appointment with PCP on 12/20 to discuss constipation.  Attempt to reach patient several times today, but unable to reach patient and unable to leave a message as no voicemail picks up.    Follow Up Plan: Will collaborate with Care Guide to outreach to schedule follow up with me  Harlow Asa, PharmD, [place] [facility] [phone]

## 2020-03-20 NOTE — Chronic Care Management (AMB) (Signed)
  Care Management   Note  03/20/2020 Name: Pamela Romero MRN: [id number] DOB: 1951/03/12  Pamela Romero is a 69 y.o. year old female who is a primary care patient of [name], [name], FNP and is actively engaged with the care management team. I reached out to [name] by phone today to assist with re-scheduling a follow up visit with the RN Case Manager Pharmacist  Follow up plan: Telephone appointment with care management team member scheduled CAR:EQJE 04/21/2019 Pharm D 05/07/2019  Noreene Larsson, [address], [place], [place] [address] Direct Dial: [phone] [email or web address]

## 2020-03-20 NOTE — Telephone Encounter (Signed)
Pt has been r/s  

## 2020-03-30 ENCOUNTER — Encounter: Payer: Self-pay | Admitting: Family Medicine

## 2020-03-30 ENCOUNTER — Other Ambulatory Visit: Payer: Self-pay | Admitting: Family Medicine

## 2020-03-30 ENCOUNTER — Other Ambulatory Visit: Payer: Self-pay

## 2020-03-30 ENCOUNTER — Ambulatory Visit (INDEPENDENT_AMBULATORY_CARE_PROVIDER_SITE_OTHER): Payer: Medicare Other | Admitting: Family Medicine

## 2020-03-30 VITALS — BP 168/96 | HR 94 | Temp 97.1°F | Resp 17 | Ht 68.0 in | Wt 227.8 lb

## 2020-03-30 DIAGNOSIS — E039 Hypothyroidism, unspecified: Secondary | ICD-10-CM

## 2020-03-30 DIAGNOSIS — K5903 Drug induced constipation: Secondary | ICD-10-CM | POA: Insufficient documentation

## 2020-03-30 DIAGNOSIS — Z1211 Encounter for screening for malignant neoplasm of colon: Secondary | ICD-10-CM | POA: Insufficient documentation

## 2020-03-30 DIAGNOSIS — T402X5A Adverse effect of other opioids, initial encounter: Secondary | ICD-10-CM | POA: Insufficient documentation

## 2020-03-30 MED ORDER — LUBIPROSTONE 24 MCG PO CAPS: 24.0000 ug | ORAL_CAPSULE | Freq: Two times a day (BID) | ORAL | 1 refills | Status: DC

## 2020-03-30 NOTE — Telephone Encounter (Signed)
Requested medication (s) are due for refill today:  N/A  Requested medication (s) are on the active medication list:  Yes  Future visit scheduled:  Yes  Last Refill: today- new order  Notes to clinic: see Pharmacy request for alternative order for Amitiza.  Requested Prescriptions  Pending Prescriptions Disp Refills   PEG 3350-KCl-NaBcb-NaCl-NaSulf (PEG-3350/ELECTROLYTES) 236 g SOLR [Pharmacy Med Name: PEG-3350 AND ELECTROLYTES SOLN]  0      Off-Protocol Failed - 03/30/2020  3:13 PM      Failed - Medication not assigned to a protocol, review manually.      Passed - Valid encounter within last 12 months    Recent Outpatient Visits           Today Therapeutic opioid-induced constipation (OIC)   [place], FNP   2 months ago Muscle cramp   [facility], [name], FNP   5 months ago Postoperative hypothyroidism   [facility], [name], FNP   6 months ago Essential hypertension   [facility], [name], FNP   7 months ago Essential hypertension   [facility], [name], FNP       Future Appointments             In 6 months [facility], [facility]

## 2020-03-30 NOTE — Patient Instructions (Signed)
Take amitiza 27mcg 1 tablet 2x per day with meals  A referral to Gastroenterology for colonoscopy has been placed today.  If you have not heard from the specialty office or our referral coordinator within 1 week, please let us know and we will follow up with the referral coordinator for an update.  We will plan to see you back in 4 weeks for constipation follow up visit  You will receive a survey after today's visit either digitally by e-mail or paper by [place] mail. Your experiences and feedback matter to [place].  Please respond so we know how we are doing as we provide care for you.  Call us with any questions/concerns/needs.  It is my goal to be available to you for your health concerns.  Thanks for choosing me to be a partner in your healthcare needs!  Harlin Rain, FNP-C Family Nurse Practitioner [facility] Phone: [phone]

## 2020-03-30 NOTE — Progress Notes (Signed)
Subjective:    Patient ID: Pamela Romero, female    DOB: 12-24-1950, 69 y.o.   MRN: [id number]  Pamela Romero is a 69 y.o. female presenting on 03/30/2020 for Constipation (Chronic constipation. Pt currently taking Senokot every day, but have not notice any improvement )   HPI  Ms. Dysert presents to clinic for concerns of chronic constipation.  Reports she has been taking senokot daily without improvement in her symptoms.  She has not been on prescription medications for constipation in the past.  Reports having straining and hard stools when she is able to have a bowel movement.  Has not had a colonoscopy or colon cancer screening in the past.  Denies blood in toilet or on toilet tissue when has a bowel movement, unintentional weight loss, lymphadenopathy or early satiety.  Depression screen [facility] 2/9 11/07/2019 09/25/2019 09/24/2019  Decreased Interest 0 0 3  Down, Depressed, Hopeless 0 0 3  PHQ - 2 Score 0 0 6  Altered sleeping - - 3  Tired, decreased energy - - 3  Change in appetite - - 1  Feeling bad or failure about yourself  - - 1  Trouble concentrating - - 1  Moving slowly or fidgety/restless - - 0  Suicidal thoughts - - 0  PHQ-9 Score - - 15  Difficult doing work/chores - - Somewhat difficult  Some recent data might be hidden    Social History   Tobacco Use  . Smoking status: Former Smoker    Packs/day: 0.50    Years: 50.00    Pack years: 25.00    Quit date: 05/11/2018    Years since quitting: 1.8  . Smokeless tobacco: Former Network engineer  . Vaping Use: Never used  Substance Use Topics  . Alcohol use: No  . Drug use: No    Review of Systems  Constitutional: Negative.   HENT: Negative.   Eyes: Negative.   Respiratory: Negative.   Cardiovascular: Negative.   Gastrointestinal: Positive for constipation. Negative for abdominal distention, abdominal pain, anal bleeding, blood in stool, diarrhea, nausea, rectal pain and vomiting.  Endocrine: Negative.    Genitourinary: Negative.   Musculoskeletal: Negative.   Skin: Negative.   Allergic/Immunologic: Negative.   Neurological: Negative.   Hematological: Negative.   Psychiatric/Behavioral: Negative.    Per HPI unless specifically indicated above     Objective:    BP (!) 168/96 (BP Location: Right Arm, Patient Position: Sitting, Cuff Size: Large)   Pulse 94   Temp (!) 97.1 F (36.2 C) (Temporal)   Resp 17   Ht 5\' 8"  (1.727 m)   Wt 227 lb 12.8 oz (103.3 kg)   SpO2 99%   BMI 34.64 kg/m   Wt Readings from Last 3 Encounters:  03/30/20 227 lb 12.8 oz (103.3 kg)  02/06/20 205 lb (93 kg)  01/02/20 229 lb 12.8 oz (104.2 kg)    Physical Exam Vitals and nursing note reviewed.  Constitutional:      General: She is not in acute distress.    Appearance: Normal appearance. She is well-developed and well-groomed. She is obese. She is not ill-appearing or toxic-appearing.  HENT:     Head: Normocephalic and atraumatic.     Nose:     Comments: Pamela Romero is in place, covering mouth and nose. Eyes:     General: Lids are normal. Vision grossly intact.        Right eye: No discharge.        Left  eye: No discharge.     Extraocular Movements: Extraocular movements intact.     Conjunctiva/sclera: Conjunctivae normal.     Pupils: Pupils are equal, round, and reactive to light.  Cardiovascular:     Rate and Rhythm: Normal rate and regular rhythm.     Pulses: Normal pulses.     Heart sounds: Normal heart sounds. No murmur heard. No friction rub. No gallop.   Pulmonary:     Effort: Pulmonary effort is normal. No respiratory distress.     Breath sounds: Normal breath sounds.  Abdominal:     General: Abdomen is flat. Bowel sounds are normal. There is no distension.     Palpations: Abdomen is soft.     Tenderness: There is no abdominal tenderness. There is no guarding or rebound.  Skin:    General: Skin is warm and dry.     Capillary Refill: Capillary refill takes less than 2 seconds.   Neurological:     General: No focal deficit present.     Mental Status: She is alert and oriented to person, place, and time.  Psychiatric:        Attention and Perception: Attention and perception normal.        Mood and Affect: Mood and affect normal.        Speech: Speech normal.        Behavior: Behavior normal. Behavior is cooperative.        Thought Content: Thought content normal.        Cognition and Memory: Cognition and memory normal.        Judgment: Judgment normal.    Results for orders placed or performed in visit on 01/02/20  Novel Coronavirus, NAA ([facility])   Specimen: Nasopharyngeal(NP) swabs in vial transport medium   Nasopharynge  Is this  Result Value Ref Range   SARS-CoV-2, NAA Not Detected Not Detected  SARS-COV-2, NAA 2 DAY TAT   Nasopharynge  Is this  Result Value Ref Range   SARS-CoV-2, NAA 2 DAY TAT Performed   COMPLETE METABOLIC PANEL WITH GFR  Result Value Ref Range   Glucose, Bld 110 (H) 65 - 99 mg/dL   BUN 24 7 - 25 mg/dL   Creat 1.09 (H) 0.50 - 0.99 mg/dL   GFR, Est Non African American 52 (L) > OR = 60 mL/min/1.40m2   GFR, Est African American 61 > OR = 60 mL/min/1.20m2   BUN/Creatinine Ratio 22 6 - 22 (calc)   Sodium 141 135 - 146 mmol/L   Potassium 4.1 3.5 - 5.3 mmol/L   Chloride 105 98 - 110 mmol/L   CO2 29 20 - 32 mmol/L   Calcium 9.3 8.6 - 10.4 mg/dL   Total Protein 6.2 6.1 - 8.1 g/dL   Albumin 3.9 3.6 - 5.1 g/dL   Globulin 2.3 1.9 - 3.7 g/dL (calc)   AG Ratio 1.7 1.0 - 2.5 (calc)   Total Bilirubin 0.5 0.2 - 1.2 mg/dL   Alkaline phosphatase (APISO) 53 37 - 153 U/L   AST 14 10 - 35 U/L   ALT 7 6 - 29 U/L  CBC with Differential/Platelet  Result Value Ref Range   WBC 5.1 3.8 - 10.8 Thousand/uL   RBC 3.64 (L) 3.80 - 5.10 Million/uL   Hemoglobin 11.9 11.7 - 15.5 g/dL   HCT 35.5 35.0 - 45.0 %   MCV 97.5 80.0 - 100.0 fL   MCH 32.7 27.0 - 33.0 pg   MCHC 33.5 32.0 - 36.0 g/dL  RDW 13.6 11.0 - 15.0 %   Platelets 133 (L) 140 - 400  Thousand/uL   MPV 10.8 7.5 - 12.5 fL   Neutro Abs 3,427 1,500 - 7,800 cells/uL   Lymphs Abs 1,270 850 - 3,900 cells/uL   Absolute Monocytes 332 200 - 950 cells/uL   Eosinophils Absolute 41 15 - 500 cells/uL   Basophils Absolute 31 0 - 200 cells/uL   Neutrophils Relative % 67.2 %   Total Lymphocyte 24.9 %   Monocytes Relative 6.5 %   Eosinophils Relative 0.8 %   Basophils Relative 0.6 %  Specimen status report  Result Value Ref Range   specimen status report Comment       Assessment & Plan:   Problem List Items Addressed This Visit      Digestive   Therapeutic opioid-induced constipation (OIC) - Primary    Opioid induced constipation, will treat with amitiza 1mcg BID.  To work towards increasing water intake, and will begin working on exercise to help with regulation of bowel movements.  To RTC in 4 weeks for re-evaluation of chronic constipation.      Relevant Medications   lubiprostone (AMITIZA) 24 MCG capsule     Other   Screening for colon cancer    Pt requiring colon cancer screening.  Denies known family history of colon cancer.  Plan: - Discussed timing for initiation of colon cancer screening ACS vs USPSTF guidelines - Mutual decision making discussion for options of colonoscopy vs cologuard.  Pt prefers colonoscopy. - Referral to GI placed.       Relevant Orders   Ambulatory referral to Gastroenterology      Meds ordered this encounter  Medications  . lubiprostone (AMITIZA) 24 MCG capsule    Sig: Take 1 capsule (24 mcg total) by mouth 2 (two) times daily with a meal.    Dispense:  180 capsule    Refill:  1   Follow up plan: Return in about 4 weeks (around 04/27/2020) for Constipation f/u.   Harlin Rain, [place] Family Nurse Practitioner [facility] 03/30/2020, 4:24 PM

## 2020-03-30 NOTE — Telephone Encounter (Signed)
Requested medication (s) are due for refill today:   Yes  Requested medication (s) are on the active medication list:   Yes  Future visit scheduled:   Yes today (12/20) with Elmyra Ricks    Address during [place]?   Last ordered: Returned to be address during OV today.   Requested Prescriptions  Pending Prescriptions Disp Refills   levothyroxine (SYNTHROID) 137 MCG tablet [Pharmacy Med Name: LEVOTHYROXINE 137 MCG TABLET] 90 tablet 0    Sig: Take 1 tablet (137 mcg total) by mouth daily before breakfast.      Endocrinology:  Hypothyroid Agents Failed - 03/30/2020  1:28 AM      Failed - TSH needs to be rechecked within 3 months after an abnormal result. Refill until TSH is due.      Failed - TSH in normal range and within 360 days    TSH  Date Value Ref Range Status  12/17/2019 57.10 (H) 0.40 - 4.50 mIU/L Final          Passed - Valid encounter within last 12 months    Recent Outpatient Visits           2 months ago Muscle cramp   [facility] [name], FNP   5 months ago Postoperative hypothyroidism   [facility], [name], FNP   6 months ago Essential hypertension   [facility], [name], FNP   7 months ago Essential hypertension   [facility], [name], FNP   9 months ago Essential hypertension   [place], Devonne Doughty, DO       Future Appointments             Today [name], [name], [facility], [place]   In 6 months  [name], [facility]

## 2020-03-30 NOTE — Assessment & Plan Note (Signed)
Pt requiring colon cancer screening.  Denies known family history of colon cancer.  Plan: - Discussed timing for initiation of colon cancer screening ACS vs USPSTF guidelines - Mutual decision making discussion for options of colonoscopy vs cologuard.  Pt prefers colonoscopy. - Referral to GI placed.

## 2020-03-30 NOTE — Assessment & Plan Note (Signed)
Opioid induced constipation, will treat with amitiza 59mcg BID.  To work towards increasing water intake, and will begin working on exercise to help with regulation of bowel movements.  To RTC in 4 weeks for re-evaluation of chronic constipation.

## 2020-03-31 ENCOUNTER — Other Ambulatory Visit: Payer: Self-pay | Admitting: Family Medicine

## 2020-03-31 DIAGNOSIS — K5903 Drug induced constipation: Secondary | ICD-10-CM

## 2020-03-31 MED ORDER — LINACLOTIDE 145 MCG PO CAPS: 145.0000 ug | ORAL_CAPSULE | Freq: Every day | ORAL | 1 refills | Status: DC

## 2020-03-31 NOTE — Telephone Encounter (Signed)
Sent in Rx for Linzess 159mcg to take 1 capsule 1-2 hours before any food.  If this is not covered by her insurance, to have her contact her insurance to request what is on their formulary.  TY

## 2020-04-01 ENCOUNTER — Other Ambulatory Visit: Payer: Self-pay | Admitting: Student in an Organized Health Care Education/Training Program

## 2020-04-07 ENCOUNTER — Emergency Department: Payer: Medicare Other | Admitting: Anesthesiology

## 2020-04-07 ENCOUNTER — Encounter
Admission: EM | Disposition: A | Discharge status: Home / Self Care | Payer: Self-pay | Source: Home / Self Care | Attending: Emergency Medicine

## 2020-04-07 ENCOUNTER — Ambulatory Visit
Admission: EM | Admit: 2020-04-07 | Discharge: 2020-04-07 | Disposition: A | Discharge status: Home / Self Care | Payer: Medicare Other | Attending: Emergency Medicine | Admitting: Emergency Medicine

## 2020-04-07 ENCOUNTER — Ambulatory Visit: Payer: Self-pay | Admitting: General Practice

## 2020-04-07 ENCOUNTER — Other Ambulatory Visit: Payer: Self-pay

## 2020-04-07 ENCOUNTER — Encounter: Payer: Self-pay | Admitting: Intensive Care

## 2020-04-07 DIAGNOSIS — T18128A Food in esophagus causing other injury, initial encounter: Secondary | ICD-10-CM

## 2020-04-07 DIAGNOSIS — Z20822 Contact with and (suspected) exposure to covid-19: Secondary | ICD-10-CM | POA: Diagnosis not present

## 2020-04-07 DIAGNOSIS — K222 Esophageal obstruction: Secondary | ICD-10-CM | POA: Insufficient documentation

## 2020-04-07 DIAGNOSIS — R1310 Dysphagia, unspecified: Secondary | ICD-10-CM

## 2020-04-07 DIAGNOSIS — E039 Hypothyroidism, unspecified: Secondary | ICD-10-CM

## 2020-04-07 DIAGNOSIS — Z79890 Hormone replacement therapy (postmenopausal): Secondary | ICD-10-CM | POA: Diagnosis not present

## 2020-04-07 DIAGNOSIS — Z7983 Long term (current) use of bisphosphonates: Secondary | ICD-10-CM | POA: Insufficient documentation

## 2020-04-07 DIAGNOSIS — X58XXXA Exposure to other specified factors, initial encounter: Secondary | ICD-10-CM | POA: Diagnosis not present

## 2020-04-07 DIAGNOSIS — J441 Chronic obstructive pulmonary disease with (acute) exacerbation: Secondary | ICD-10-CM

## 2020-04-07 DIAGNOSIS — Z87891 Personal history of nicotine dependence: Secondary | ICD-10-CM | POA: Insufficient documentation

## 2020-04-07 DIAGNOSIS — Z79899 Other long term (current) drug therapy: Secondary | ICD-10-CM | POA: Diagnosis not present

## 2020-04-07 DIAGNOSIS — I10 Essential (primary) hypertension: Secondary | ICD-10-CM

## 2020-04-07 HISTORY — PX: ESOPHAGOGASTRODUODENOSCOPY: SHX5428

## 2020-04-07 HISTORY — DX: Chronic obstructive pulmonary disease, unspecified: J44.9

## 2020-04-07 LAB — BASIC METABOLIC PANEL
Anion gap: 8 (ref 5–15)
BUN: 15 mg/dL (ref 8–23)
CO2: 28 mmol/L (ref 22–32)
Calcium: 9.8 mg/dL (ref 8.9–10.3)
Chloride: 104 mmol/L (ref 98–111)
Creatinine, Ser: 0.82 mg/dL (ref 0.44–1.00)
GFR, Estimated: >60 mL/min (ref >60)
Glucose, Bld: 88 mg/dL (ref 70–99)
Potassium: 3.9 mmol/L (ref 3.5–5.1)
Sodium: 140 mmol/L (ref 135–145)

## 2020-04-07 LAB — CBC
HCT: 37.6 % (ref 36.0–46.0)
Hemoglobin: 12.9 g/dL (ref 12.0–15.0)
MCH: 32 pg (ref 26.0–34.0)
MCHC: 34.3 g/dL (ref 30.0–36.0)
MCV: 93.3 fL (ref 80.0–100.0)
Platelets: 149 10*3/uL — ABNORMAL LOW (ref 150–400)
RBC: 4.03 MIL/uL (ref 3.87–5.11)
RDW: 12.4 % (ref 11.5–15.5)
WBC: 6.9 10*3/uL (ref 4.0–10.5)
nRBC: 0 % (ref 0.0–0.2)

## 2020-04-07 LAB — RESP PANEL BY RT-PCR (FLU A&B, COVID) ARPGX2
Influenza A by PCR: NEGATIVE
Influenza B by PCR: NEGATIVE
SARS Coronavirus 2 by RT PCR: NEGATIVE

## 2020-04-07 SURGERY — EGD (ESOPHAGOGASTRODUODENOSCOPY)
Anesthesia: General

## 2020-04-07 MED ORDER — SUCCINYLCHOLINE CHLORIDE 20 MG/ML IJ SOLN
INTRAMUSCULAR | Status: DC | PRN
  Administered 2020-04-07: 100 mg via INTRAVENOUS

## 2020-04-07 MED ORDER — LACTATED RINGERS IV SOLN: INTRAVENOUS | Status: DC | PRN

## 2020-04-07 MED ORDER — ROCURONIUM BROMIDE 100 MG/10ML IV SOLN
INTRAVENOUS | Status: DC | PRN
  Administered 2020-04-07: 10 mg via INTRAVENOUS

## 2020-04-07 MED ORDER — PROPOFOL 10 MG/ML IV BOLUS
INTRAVENOUS | Status: DC | PRN
  Administered 2020-04-07: 180 mg via INTRAVENOUS

## 2020-04-07 MED ORDER — SODIUM CHLORIDE 0.9 % IV SOLN: Freq: Once | INTRAVENOUS | Status: AC

## 2020-04-07 MED ORDER — SODIUM CHLORIDE 0.9 % IV SOLN: INTRAVENOUS | Status: DC

## 2020-04-07 MED ORDER — MIDAZOLAM HCL 2 MG/2ML IJ SOLN
INTRAMUSCULAR | Status: AC
  Filled 2020-04-07: qty 2

## 2020-04-07 MED ORDER — GLUCAGON HCL RDNA (DIAGNOSTIC) 1 MG IJ SOLR
1.0000 mg | Freq: Once | INTRAMUSCULAR | Status: AC
  Administered 2020-04-07: 12:00:00 1 mg via INTRAVENOUS
  Filled 2020-04-07: qty 1

## 2020-04-07 MED ORDER — LIDOCAINE HCL (CARDIAC) PF 100 MG/5ML IV SOSY
PREFILLED_SYRINGE | INTRAVENOUS | Status: DC | PRN
  Administered 2020-04-07: 80 mg via INTRAVENOUS

## 2020-04-07 MED ORDER — ONDANSETRON HCL 4 MG/2ML IJ SOLN
INTRAMUSCULAR | Status: DC | PRN
  Administered 2020-04-07: 4 mg via INTRAVENOUS

## 2020-04-07 MED ORDER — SUGAMMADEX SODIUM 200 MG/2ML IV SOLN
INTRAVENOUS | Status: DC | PRN
  Administered 2020-04-07: 200 mg via INTRAVENOUS

## 2020-04-07 MED ORDER — ONDANSETRON HCL 4 MG/2ML IJ SOLN
4.0000 mg | Freq: Once | INTRAMUSCULAR | Status: AC
  Administered 2020-04-07: 4 mg via INTRAVENOUS

## 2020-04-07 MED ORDER — ONDANSETRON HCL 4 MG/2ML IJ SOLN
INTRAMUSCULAR | Status: AC
  Filled 2020-04-07: qty 2

## 2020-04-07 MED ORDER — MIDAZOLAM HCL 2 MG/2ML IJ SOLN
INTRAMUSCULAR | Status: DC | PRN
  Administered 2020-04-07: .5 mg via INTRAVENOUS

## 2020-04-07 MED ORDER — DEXAMETHASONE SODIUM PHOSPHATE 10 MG/ML IJ SOLN
INTRAMUSCULAR | Status: DC | PRN
  Administered 2020-04-07: 8 mg via INTRAVENOUS

## 2020-04-07 MED ORDER — OMEPRAZOLE 20 MG PO CPDR: 40.0000 mg | DELAYED_RELEASE_CAPSULE | Freq: Every day | ORAL | 0 refills | Status: DC

## 2020-04-07 NOTE — Anesthesia Postprocedure Evaluation (Signed)
Anesthesia Post Note  Patient: BALBINA DEPACE  Procedure(s) Performed: ESOPHAGOGASTRODUODENOSCOPY (EGD) (N/A )  Patient location during evaluation: PACU Anesthesia Type: General Level of consciousness: awake and alert Pain management: pain level controlled Vital Signs Assessment: post-procedure vital signs reviewed and stable Respiratory status: spontaneous breathing, nonlabored ventilation and respiratory function stable Cardiovascular status: blood pressure returned to baseline and stable Postop Assessment: no apparent nausea or vomiting Anesthetic complications: no   No complications documented.   Last Vitals:  Vitals:   04/07/20 1407 04/07/20 1416  BP: (!) 154/88   Pulse: 88 85  Resp: (!) 22 20  Temp:  (!) 36.1 C  SpO2: 95% 95%    Last Pain:  Vitals:   04/07/20 1416  TempSrc:   PainSc: 0-No pain                 Aurelio Brash Chrisangel Eskenazi

## 2020-04-07 NOTE — Op Note (Signed)
[facility] Gastroenterology Patient Name: Pamela Romero Procedure Date: 04/07/2020 12:37 PM MRN: [id number] Account #: [id number] Date of Birth: 1950/06/25 Admit Type: Emergency Department Age: 69 Room: [facility] ENDO ROOM 1 Gender: Female Note Status: Finalized Procedure:             Upper GI endoscopy Indications:           Foreign body in the esophagus Providers:             Jonathon Bellows MD, MD Referring MD:          [name]. [name] (Referring MD) Medicines:             Monitored Anesthesia Care Complications:         No immediate complications. Procedure:             Pre-Anesthesia Assessment:                        - Prior to the procedure, a History and Physical was                         performed, and patient medications, allergies and                         sensitivities were reviewed. The patient's tolerance                         of previous anesthesia was reviewed.                        - The risks and benefits of the procedure and the                         sedation options and risks were discussed with the                         patient. All questions were answered and informed                         consent was obtained.                        - ASA Grade Assessment: II - A patient with mild                         systemic disease.                        After obtaining informed consent, the endoscope was                         passed under direct vision. Throughout the procedure,                         the patient's blood pressure, pulse, and oxygen                         saturations were monitored continuously. The Endoscope                         was introduced through  the mouth, and advanced to the                         third part of duodenum. The upper GI endoscopy was                         accomplished with ease. The patient tolerated the                         procedure well. Findings:      Food was found at the  gastroesophageal junction. Removal of food was       accomplished.      A mild Schatzki ring was found at the gastroesophageal junction.      The exam was otherwise without abnormality.      The stomach was normal.      The cardia and gastric fundus were normal on retroflexion.      The examined duodenum was normal. Impression:            - Food at the gastroesophageal junction. Removal was                         successful.                        - Mild Schatzki ring.                        - The examination was otherwise normal.                        - Normal stomach.                        - Normal examined duodenum. Recommendation:        - Discharge patient to home (with escort).                        - Resume previous diet.                        - Continue present medications.                        - Return to my office in 4 weeks.                        - Commence on Prilosec OTC 40 mg a day for 8 weeks Procedure Code(s):     --- Professional ---                        [phone], Esophagogastroduodenoscopy, flexible,                         transoral; with removal of foreign body(s) Diagnosis Code(s):     --- Professional ---                        [id number], Food in esophagus causing other injury,                         initial encounter  K22.2, Esophageal obstruction                        T18.108A, Unspecified foreign body in esophagus                         causing other injury, initial encounter CPT copyright 2019 American Medical Association. All rights reserved. The codes documented in this report are preliminary and upon coder review may  be revised to meet current compliance requirements. Wyline Mood, MD Wyline Mood MD, MD 04/07/2020 1:35:07 PM This report has been signed electronically. Number of Addenda: 0 Note Initiated On: 04/07/2020 12:37 PM Estimated Blood Loss:  Estimated blood loss: none.      [facility]

## 2020-04-07 NOTE — Anesthesia Preprocedure Evaluation (Addendum)
Anesthesia Evaluation  Patient identified by MRN, date of birth, ID band Patient awake    Reviewed: Allergy & Precautions, H&P , NPO status , Patient's Chart, lab work & pertinent test results  History of Anesthesia Complications Negative for: history of anesthetic complications  Airway Mallampati: II  TM Distance: >3 FB     Dental  (+) Upper Dentures, Lower Dentures   Pulmonary shortness of breath, neg sleep apnea, COPD, former smoker,    breath sounds clear to auscultation       Cardiovascular hypertension, (-) angina+ CAD  (-) Past MI and (-) Cardiac Stents (-) dysrhythmias  Rhythm:regular Rate:Normal     Neuro/Psych  Headaches, PSYCHIATRIC DISORDERS Anxiety Depression    GI/Hepatic Neg liver ROS, GERD  ,  Endo/Other  Hypothyroidism   Renal/GU Renal disease (CKD)     Musculoskeletal  (+) Arthritis ,   Abdominal   Peds  Hematology negative hematology ROS (+)   Anesthesia Other Findings Food impaction  Past Medical History: No date: Abnormal EKG No date: CAD (coronary artery disease)     Comment:  non critical, by cardiac cath 2006 No date: Cancer [facility])     Comment:  endometrial Ca No date: COPD (chronic obstructive pulmonary disease) (HCC) 2011: Endometrial adenocarcinoma (HCC) No date: Hyperlipidemia No date: Hypertension No date: Osteoarthritis     Comment:  Right foot, Left knee (Dr. Joice Lofts) No date: Reflux gastritis No date: Thyroid disease No date: tobacco abuse No date: Vaginal delivery     Comment:  x 2  Past Surgical History: 2014: BREAST CYST ASPIRATION; Right     Comment:  neg March 2014: BREAST SURGERY; Right     Comment:  benign Byrnett 2006: CARDIAC CATHETERIZATION     Comment:  40% LAD, EF 60% 2005: THYROIDECTOMY     Comment:  partial at [facility], non malignant tumor No date: TOTAL ABDOMINAL HYSTERECTOMY     Comment:  with BSO  BMI    Body Mass Index: 31.17 kg/m       Reproductive/Obstetrics negative OB ROS                            Anesthesia Physical Anesthesia Plan  ASA: III  Anesthesia Plan: General ETT and Rapid Sequence   Post-op Pain Management:    Induction:   PONV Risk Score and Plan: Ondansetron, Dexamethasone, Midazolam and Treatment may vary due to age or medical condition  Airway Management Planned:   Additional Equipment:   Intra-op Plan:   Post-operative Plan:   Informed Consent: I have reviewed the patients History and Physical, chart, labs and discussed the procedure including the risks, benefits and alternatives for the proposed anesthesia with the patient or authorized representative who has indicated his/her understanding and acceptance.     Dental Advisory Given  Plan Discussed with: Anesthesiologist, CRNA and Surgeon  Anesthesia Plan Comments:         Anesthesia Quick Evaluation

## 2020-04-07 NOTE — Transfer of Care (Signed)
Immediate Anesthesia Transfer of Care Note  Patient: Pamela Romero  Procedure(s) Performed: ESOPHAGOGASTRODUODENOSCOPY (EGD) (N/A )  Patient Location: PACU  Anesthesia Type:General  Level of Consciousness: awake, alert  and oriented  Airway & Oxygen Therapy: Patient Spontanous Breathing and Patient connected to face mask oxygen  Post-op Assessment: Report given to RN and Post -op Vital signs reviewed and stable  Post vital signs: Reviewed and stable  Last Vitals:  Vitals Value Taken Time  BP 153/98 04/07/20 1352  Temp 36.5 C 04/07/20 1352  Pulse 84 04/07/20 1358  Resp 17 04/07/20 1358  SpO2 99 % 04/07/20 1358  Vitals shown include unvalidated device data.  Last Pain:  Vitals:   04/07/20 1019  TempSrc:   PainSc: 4          Complications: No complications documented.

## 2020-04-07 NOTE — Anesthesia Procedure Notes (Signed)
Procedure Name: Intubation Date/Time: 04/07/2020 1:15 PM Performed by: Henrietta Hoover, CRNA Pre-anesthesia Checklist: Patient identified, Patient being monitored, Timeout performed, Emergency Drugs available and Suction available Patient Re-evaluated:Patient Re-evaluated prior to induction Oxygen Delivery Method: Circle system utilized Preoxygenation: Pre-oxygenation with 100% oxygen Induction Type: IV induction Ventilation: Mask ventilation without difficulty Laryngoscope Size: 3 and McGraph Grade View: Grade I Tube type: Oral Tube size: 7.0 mm Number of attempts: 1 Airway Equipment and Method: Stylet Placement Confirmation: ETT inserted through vocal cords under direct vision,  positive ETCO2 and breath sounds checked- equal and bilateral Secured at: 21 cm Tube secured with: Tape Dental Injury: Teeth and Oropharynx as per pre-operative assessment

## 2020-04-07 NOTE — H&P (Signed)
Wyline Mood , MD [address], [place], [place], [address] [address], [address], [place], [place], [address] Phone: [phone]  Fax: [phone]  Consultation  Referring Provider:   Dr. Cyril Loosen emergency room  primary Care Physician:  Tarri Fuller, FNP Primary Gastroenterologist:     None      Reason for Consultation:     Inability to swallow  Date of Admission:  04/07/2020 Date of Consultation:  04/07/2020         HPI:   Pamela Romero is a 69 y.o. female who has a history of dysphagia and had a Schatzki's ring dilated back in 2016 presented to the emergency room this afternoon with difficulty swallowing.  Was having her meal accompanied with last night and felt it lodged in her esophagus and since then unable to complete any fluids or solids down.  This has happened in the past but never needed endoscopy or an ER visit.  She has received some glucagon in the ER.  The patient is on alendronate opioids.  Hemoglobin normal at 12.9 g and BMP shows no abnormalities.  She states that she is not able to keep any solids or liquids down even after coming to the ER and having the glucagon.  States that swallowing is "harder over the last few months.  No other complaints.  Able to speak complete sentences and tolerate her secretions  Past Medical History:  Diagnosis Date  . Abnormal EKG   . CAD (coronary artery disease)    non critical, by cardiac cath 2006  . Cancer (HCC)    endometrial Ca  . COPD (chronic obstructive pulmonary disease) (HCC)   . Endometrial adenocarcinoma (HCC) 2011  . Hyperlipidemia   . Hypertension   . Osteoarthritis    Right foot, Left knee (Dr. Joice Lofts)  . Reflux gastritis   . Thyroid disease   . tobacco abuse   . Vaginal delivery    x 2    Past Surgical History:  Procedure Laterality Date  . BREAST CYST ASPIRATION Right 2014   neg  . BREAST SURGERY Right March 2014   benign Byrnett  . CARDIAC CATHETERIZATION  2006   40% LAD, EF 60%   . THYROIDECTOMY  2005   partial at [facility] non malignant tumor  . TOTAL ABDOMINAL HYSTERECTOMY     with BSO    Prior to Admission medications   Medication Sig Start Date End Date Taking? Authorizing Provider  albuterol (PROAIR HFA) 108 (90 Base) MCG/ACT inhaler Inhale 1-2 puffs into the lungs every 6 (six) hours as needed for wheezing or shortness of breath. 01/02/20   [name], Jodelle Gross, FNP  alendronate (FOSAMAX) 70 MG tablet Take 1 tablet (70 mg total) by mouth every 7 (seven) days. Take with a full glass of water on an empty stomach. 11/22/19   [name], Jodelle Gross, FNP  docusate sodium (COLACE) 100 MG capsule Take 1 capsule (100 mg total) by mouth 2 (two) times daily as needed. 10/28/19   [name], Jodelle Gross, FNP  esomeprazole (NEXIUM) 40 MG capsule Take 40 mg by mouth daily.    [provider]  gabapentin (NEURONTIN) 100 MG capsule Take 1-3 capsules (100-300 mg total) by mouth at bedtime. Follow written titration schedule. 02/06/20 03/07/20  Edward Jolly, MD  HYDROcodone-acetaminophen (NORCO) 7.5-325 MG tablet Take 1 tablet by mouth every 6 (six) hours as needed for severe pain. Must last 30 days. 03/19/20 04/18/20  Edward Jolly, MD  HYDROcodone-acetaminophen (NORCO) 7.5-325  MG tablet Take 1 tablet by mouth every 6 (six) hours as needed for severe pain. Must last 30 days. 04/18/20 05/18/20  Gillis Santa, MD  levothyroxine (SYNTHROID) 137 MCG tablet TAKE 1 TABLET (137 MCG TOTAL) BY MOUTH DAILY BEFORE BREAKFAST. 03/30/20   [name], [name], FNP  linaclotide Rolan Lipa) 145 MCG CAPS capsule Take 1 capsule (145 mcg total) by mouth daily before breakfast. 03/31/20   [name], [name], FNP  lisinopril-hydrochlorothiazide (ZESTORETIC) 20-12.5 MG tablet Take 1 tablet by mouth daily. 09/24/19   [name], [name], FNP  montelukast (SINGULAIR) 10 MG tablet Take 1 tablet (10 mg total) by mouth at bedtime. 01/11/19   Mikey College, NP  polyethylene glycol powder (GLYCOLAX/MIRALAX) 17 GM/SCOOP powder Take 17 g by  mouth daily as needed for mild constipation or moderate constipation. 06/10/19   Karamalegos, Devonne Doughty, DO  senna-docusate (SENNA-S) 8.6-50 MG tablet Take 1 tablet by mouth 2 (two) times daily as needed for mild constipation. 10/28/19   [name], [name], FNP  sertraline (ZOLOFT) 25 MG tablet Take 1 tablet by mouth daily. 12/03/19   [provider]    Family History  Problem Relation Age of Onset  . Alcohol abuse Father   . Cancer Maternal Aunt        stomach  . Alcohol abuse Maternal Aunt   . Alcohol abuse Mother   . Heart disease Brother        MI age 6- non smoker  . Cancer Brother        Pancreatic  . Cancer Brother        colon, dx'd age 3  . Cancer Brother        stomach, paternal half brother  . Breast cancer Neg Hx      Social History   Tobacco Use  . Smoking status: Former Smoker    Packs/day: 0.50    Years: 50.00    Pack years: 25.00    Quit date: 05/11/2018    Years since quitting: 1.9  . Smokeless tobacco: Former Network engineer  . Vaping Use: Never used  Substance Use Topics  . Alcohol use: No  . Drug use: No    Allergies as of 04/07/2020  . (No Known Allergies)    Review of Systems:    All systems reviewed and negative except where noted in HPI.   Physical Exam:  Vital signs in last 24 hours: Temp:  [99.1 F (37.3 C)] 99.1 F (37.3 C) (12/28 1018) Pulse Rate:  [106] 106 (12/28 1018) Resp:  [22] 22 (12/28 1018) SpO2:  [99 %] 99 % (12/28 1018) Weight:  [93 kg] 93 kg (12/28 1020)   General:   Pleasant, cooperative in NAD Head:  Normocephalic and atraumatic. Eyes:   No icterus.   Conjunctiva pink. PERRLA. Ears:  Normal auditory acuity. Neck:  Supple; no masses or thyroidomegaly Lungs: Respirations even and unlabored. Lungs clear to auscultation bilaterally.   No wheezes, crackles, or rhonchi.  Heart:  Regular rate and rhythm;  Without murmur, clicks, rubs or gallops Abdomen:  Soft, nondistended, nontender. Normal bowel sounds. No  appreciable masses or hepatomegaly.  No rebound or guarding.  Neurologic:  Alert and oriented x3;  grossly normal neurologically. Skin:  Intact without significant lesions or rashes. Cervical Nodes:  No significant cervical adenopathy. Psych:  Alert and cooperative. Normal affect.  LAB RESULTS: Recent Labs    04/07/20 1022  WBC 6.9  HGB 12.9  HCT 37.6  PLT 149*  BMET Recent Labs    04/07/20 1022  NA 140  K 3.9  CL 104  CO2 28  GLUCOSE 88  BUN 15  CREATININE 0.82  CALCIUM 9.8   LFT No results for input(s): PROT, ALBUMIN, AST, ALT, ALKPHOS, BILITOT, BILIDIR, IBILI in the last 72 hours. PT/INR No results for input(s): LABPROT, INR in the last 72 hours.  STUDIES: No results found.    Impression / Plan:   Pamela Romero is a 69 y.o. y/o female with a prior history of a Schatzki's ring that was dilated back in 2016 presented to the emergency room with inability to swallow any liquids or solids since 6 PM last night after she had a [place] sandwich.  Complaining of food bolus impaction.  Received glucagon in the ER.  Plan 1.  EGD 2.  Will need outpatient GI follow-up and evaluation.  I have discussed alternative options, risks & benefits,  which include, but are not limited to, bleeding, infection, perforation,respiratory complication & drug reaction.  The patient agrees with this plan & written consent will be obtained.     Thank you for involving me in the care of this patient.      LOS: 0 days   Wyline Mood, MD  04/07/2020, 12:20 PM

## 2020-04-07 NOTE — ED Notes (Signed)
Victorino Dike RN talking to endoscopy RN. Pt is being taken up to endo in wheelchair. IV in place.

## 2020-04-07 NOTE — ED Triage Notes (Signed)
Patient reports eating sandwich last night and "going down the wrong way." Reports now when she eats/drinks it comes right back up.

## 2020-04-07 NOTE — ED Provider Notes (Signed)
[facility] Emergency Department Provider Note   ____________________________________________    I have reviewed the triage vital signs and the nursing notes.   HISTORY  Chief Complaint Aspiration     HPI Pamela Romero is a 69 y.o. female with history as noted below who presents with complaints of esophageal food bolus.  Patient reports yesterday at 6 PM she was eating a [place] sandwich and she found something lodged in her esophagus.  She tried to drink some soda afterwards but vomited it right up.  She has tried multiple times to drink something and has been unable to do so.  She reports she has had things get stuck in the past but typically was able to figure out a way to get it through however that has not been the case today.  No other complaints at this time  Past Medical History:  Diagnosis Date  . Abnormal EKG   . CAD (coronary artery disease)    non critical, by cardiac cath 2006  . Cancer ([place])    endometrial Ca  . COPD (chronic obstructive pulmonary disease) ([place])   . Endometrial adenocarcinoma ([place]) 2011  . Hyperlipidemia   . Hypertension   . Osteoarthritis    Right foot, Left knee (Dr. Roland Rack)  . Reflux gastritis   . Thyroid disease   . tobacco abuse   . Vaginal delivery    x 2    Patient Active Problem List   Diagnosis Date Noted  . Therapeutic opioid-induced constipation (OIC) 03/30/2020  . Screening for colon cancer 03/30/2020  . Generalized body aches 01/06/2020  . COPD exacerbation ([employer]) 01/06/2020  . Muscle cramp 01/02/2020  . Need for immunization against influenza 01/02/2020  . Osteoporosis 11/19/2019  . Fatigue 08/30/2019  . Chronic migraine 07/01/2019  . Presbycusis of both ears 06/10/2019  . Memory loss 06/10/2019  . Chronic obstructive pulmonary disease with acute exacerbation ([place]) 01/17/2019  . Bilateral primary osteoarthritis of knee 12/27/2017  . Chronic pain of right ankle 12/27/2017  . Primary  osteoarthritis of right ankle 12/27/2017  . Acute left-sided low back pain without sciatica 07/19/2017  . Chronic pain of left knee 07/17/2017  . Plantar fascia syndrome 07/17/2017  . Shortness of breath 04/17/2017  . Cellulitis of left lower limb 04/17/2017  . Edema, unspecified 04/17/2017  . 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 Chronic kidney disease, unspecified 04/17/2017  . Anxiety disorder, unspecified 04/17/2017  . Nicotine dependence, cigarettes, uncomplicated [id number]  . Obesity, unspecified 12/18/2013  . Annual physical exam 12/18/2013  . S/P breast biopsy 12/18/2013  . Pes planus of both feet 12/18/2013  . Cough 09/16/2013  . Dysphagia 09/16/2013  . Constipation 10/02/2012  . Transient vision disturbance, bilateral 04/02/2012  . Hyperlipidemia LDL goal <100 02/08/2012  . 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 Tobacco abuse counseling 04/29/2011  . Hypothyroidism 04/29/2011  . Essential hypertension 04/29/2011  . Esophageal reflux 04/29/2011  . Osteoarthritis   . Endometrial cancer [facility])     Past Surgical History:  Procedure Laterality Date  . BREAST CYST ASPIRATION Right 2014   neg  . BREAST SURGERY Right March 2014   benign [place]  . CARDIAC CATHETERIZATION  2006   40% LAD, EF 60%  . THYROIDECTOMY  2005   partial at [facility], non malignant tumor  . TOTAL ABDOMINAL HYSTERECTOMY     with BSO    Prior to Admission medications  Medication Sig Start Date End Date Taking? Authorizing Provider  albuterol (PROAIR HFA) 108 (90 Base) MCG/ACT inhaler Inhale 1-2 puffs into the lungs every 6 (six) hours as needed for wheezing or shortness of breath. 01/02/20   [name], Jodelle Gross, FNP  alendronate (FOSAMAX) 70 MG tablet Take 1 tablet (70 mg total) by mouth every 7 (seven) days. Take with a full glass of water on an empty stomach. 11/22/19   [name], Jodelle Gross, FNP  docusate  sodium (COLACE) 100 MG capsule Take 1 capsule (100 mg total) by mouth 2 (two) times daily as needed. 10/28/19   [name], Jodelle Gross, FNP  esomeprazole (NEXIUM) 40 MG capsule Take 40 mg by mouth daily.    [provider]  gabapentin (NEURONTIN) 100 MG capsule Take 1-3 capsules (100-300 mg total) by mouth at bedtime. Follow written titration schedule. 02/06/20 03/07/20  Edward Jolly, MD  HYDROcodone-acetaminophen (NORCO) 7.5-325 MG tablet Take 1 tablet by mouth every 6 (six) hours as needed for severe pain. Must last 30 days. 03/19/20 04/18/20  Edward Jolly, MD  HYDROcodone-acetaminophen (NORCO) 7.5-325 MG tablet Take 1 tablet by mouth every 6 (six) hours as needed for severe pain. Must last 30 days. 04/18/20 05/18/20  Edward Jolly, MD  levothyroxine (SYNTHROID) 137 MCG tablet TAKE 1 TABLET (137 MCG TOTAL) BY MOUTH DAILY BEFORE BREAKFAST. 03/30/20   [name], Jodelle Gross, FNP  linaclotide Karlene Einstein) 145 MCG CAPS capsule Take 1 capsule (145 mcg total) by mouth daily before breakfast. 03/31/20   [name], Jodelle Gross, FNP  lisinopril-hydrochlorothiazide (ZESTORETIC) 20-12.5 MG tablet Take 1 tablet by mouth daily. 09/24/19   [name], Jodelle Gross, FNP  montelukast (SINGULAIR) 10 MG tablet Take 1 tablet (10 mg total) by mouth at bedtime. 01/11/19   Galen Manila, NP  polyethylene glycol powder (GLYCOLAX/MIRALAX) 17 GM/SCOOP powder Take 17 g by mouth daily as needed for mild constipation or moderate constipation. 06/10/19   Karamalegos, Netta Neat, DO  senna-docusate (SENNA-S) 8.6-50 MG tablet Take 1 tablet by mouth 2 (two) times daily as needed for mild constipation. 10/28/19   [name], Jodelle Gross, FNP  sertraline (ZOLOFT) 25 MG tablet Take 1 tablet by mouth daily. 12/03/19   [provider]     Allergies Patient has no known allergies.  Family History  Problem Relation Age of Onset  . Alcohol abuse Father   . Cancer Maternal Aunt        stomach  . Alcohol abuse Maternal Aunt   . Alcohol abuse Mother   .  Heart disease Brother        MI age 91- non smoker  . Cancer Brother        Pancreatic  . Cancer Brother        colon, dx'd age 27  . Cancer Brother        stomach, paternal half brother  . Breast cancer Neg Hx     Social History Social History   Tobacco Use  . Smoking status: Former Smoker    Packs/day: 0.50    Years: 50.00    Pack years: 25.00    Quit date: 05/11/2018    Years since quitting: 1.9  . Smokeless tobacco: Former Clinical biochemist  . Vaping Use: Never used  Substance Use Topics  . Alcohol use: No  . Drug use: No    Review of Systems  Constitutional: No fever/chills Eyes: No visual changes.  ENT: No sore throat. Cardiovascular: Denies chest pain. Respiratory: Denies shortness of breath. Gastrointestinal: No  abdominal pain, symptoms as above Genitourinary: Negative for dysuria. Musculoskeletal: Negative for back pain. Skin: Negative for rash. Neurological: Negative for headaches   ____________________________________________   PHYSICAL EXAM:  VITAL SIGNS: ED Triage Vitals  Enc Vitals Group     BP --      Pulse Rate 04/07/20 1018 (!) 106     Resp 04/07/20 1018 (!) 22     Temp 04/07/20 1018 99.1 F (37.3 C)     Temp Source 04/07/20 1018 Oral     SpO2 04/07/20 1018 99 %     Weight 04/07/20 1020 93 kg (205 lb)     Height 04/07/20 1020 1.727 m (5\' 8" )     Head Circumference --      Peak Flow --      Pain Score 04/07/20 1019 4     Pain Loc --      Pain Edu? --      Excl. in [place]? --     Constitutional: Alert and oriented.  Nose: No congestion/rhinnorhea. Mouth/Throat: Mucous membranes are moist.   Neck:  Painless ROM Cardiovascular: Normal rate, regular rhythm.   Good peripheral circulation. Respiratory: Normal respiratory effort.  No retractions.   Musculoskeletal:  Warm and well perfused Neurologic:  Normal speech and language. No gross focal neurologic deficits are appreciated.  Skin:  Skin is warm, dry and intact. No rash  noted. Psychiatric: Mood and affect are normal. Speech and behavior are normal.  ____________________________________________   LABS (all labs ordered are listed, but only abnormal results are displayed)  Labs Reviewed  CBC - Abnormal; Notable for the following components:      Result Value   Platelets 149 (*)    All other components within normal limits  RESP PANEL BY RT-PCR (FLU A&B, COVID) ARPGX2  BASIC METABOLIC PANEL   ____________________________________________  EKG  None ____________________________________________  RADIOLOGY  None ____________________________________________   PROCEDURES  Procedure(s) performed: No  Procedures   Critical Care performed: No ____________________________________________   INITIAL IMPRESSION / ASSESSMENT AND PLAN / ED COURSE  Pertinent labs & imaging results that were available during my care of the patient were reviewed by me and considered in my medical decision making (see chart for details).  Patient presents with symptoms consistent with esophageal food bolus.  IV glucagon given with no relief.  Lab work is reassuring, Covid swab is negative.  Discussed with Dr. Vicente Males of GI, he will take to endoscopy  No imaging performed    ____________________________________________   FINAL CLINICAL IMPRESSION(S) / ED DIAGNOSES  Final diagnoses:  Esophageal obstruction due to food impaction        Note:  This document was prepared using Dragon voice recognition software and may include unintentional dictation errors.   Lavonia Drafts, MD 04/07/20 [phone]

## 2020-04-07 NOTE — Chronic Care Management (AMB) (Signed)
Chronic Care Management   Follow Up Note   04/07/2020 Name: Pamela Romero MRN: [id number] DOB: 1951/04/03  Referred by: Tarri Fuller, FNP Reason for referral : Chronic Care Management (RNCM: Incoming Call from the patient asking for recommendations for not being able to swallow)   Pamela Romero is a 69 y.o. year old female who is a primary care patient of Tarri Fuller, FNP. The CCM team was consulted for assistance with chronic disease management and care coordination needs.    Review of patient status, including review of consultants reports, relevant laboratory and other test results, and collaboration with appropriate care team members and the patient's provider was performed as part of comprehensive patient evaluation and provision of chronic care management services.    SDOH (Social Determinants of Health) assessments performed: Yes See Care Plan activities for detailed interventions related to [facility])     Outpatient Encounter Medications as of 04/07/2020  Medication Sig Note  . albuterol (PROAIR HFA) 108 (90 Base) MCG/ACT inhaler Inhale 1-2 puffs into the lungs every 6 (six) hours as needed for wheezing or shortness of breath.   [name] alendronate (FOSAMAX) 70 MG tablet Take 1 tablet (70 mg total) by mouth every 7 (seven) days. Take with a full glass of water on an empty stomach. 12/09/2019: Taking on Fridays  . docusate sodium (COLACE) 100 MG capsule Take 1 capsule (100 mg total) by mouth 2 (two) times daily as needed.   [name] esomeprazole (NEXIUM) 40 MG capsule Take 40 mg by mouth daily.   [name] gabapentin (NEURONTIN) 100 MG capsule Take 1-3 capsules (100-300 mg total) by mouth at bedtime. Follow written titration schedule.   [name] HYDROcodone-acetaminophen (NORCO) 7.5-325 MG tablet Take 1 tablet by mouth every 6 (six) hours as needed for severe pain. Must last 30 days.   Melene Muller ON 04/18/2020] HYDROcodone-acetaminophen (NORCO) 7.5-325 MG tablet Take 1 tablet by mouth every 6 (six) hours  as needed for severe pain. Must last 30 days.   [name] levothyroxine (SYNTHROID) 137 MCG tablet TAKE 1 TABLET (137 MCG TOTAL) BY MOUTH DAILY BEFORE BREAKFAST.   [name] linaclotide (LINZESS) 145 MCG CAPS capsule Take 1 capsule (145 mcg total) by mouth daily before breakfast.   . lisinopril-hydrochlorothiazide (ZESTORETIC) 20-12.5 MG tablet Take 1 tablet by mouth daily.   . montelukast (SINGULAIR) 10 MG tablet Take 1 tablet (10 mg total) by mouth at bedtime.   . polyethylene glycol powder (GLYCOLAX/MIRALAX) 17 GM/SCOOP powder Take 17 g by mouth daily as needed for mild constipation or moderate constipation.   . senna-docusate (SENNA-S) 8.6-50 MG tablet Take 1 tablet by mouth 2 (two) times daily as needed for mild constipation.   . sertraline (ZOLOFT) 25 MG tablet Take 1 tablet by mouth daily.    No facility-administered encounter medications on file as of 04/07/2020.     Objective:  BP Readings from Last 3 Encounters:  03/30/20 (!) 168/96  02/06/20 112/87  01/02/20 (!) 119/93    Goals Addressed            This Visit's Progress   . RNCM: PT- "I have not been taking my medication like I should" I need to do better.       CARE PLAN ENTRY (see longtitudinal plan of care for additional care plan information)  Current Barriers:  . Chronic Disease Management support, education, and care coordination needs related to HTN, COPD, and Hypothyroidism  . Financial Barriers   Clinical Goal(s) related to HTN, COPD, and Hypothyroidism :  Over the next 120 days, patient will:  . Work with the care management team to address educational, disease management, and care coordination needs  . Begin or continue self health monitoring activities as directed today Measure and record blood pressure 5 times per week and adhere to a Heart Healthy Diet . Call provider office for new or worsened signs and symptoms Blood pressure findings outside established parameters and New or worsened symptom related to COPD or  Hypothyroidism  . Call care management team with questions or concerns . Verbalize basic understanding of patient centered plan of care established today  Interventions related to HTN, COPD, and Hypothyroidism  :  . Evaluation of current treatment plans and patient's adherence to plan as established by provider: Pt saw pcp recently and was evaluated for concerns. The patient has not always been taking her medications as prescribed. The patient is committed to changing her habits and getting on track with her health and well being.  The patient endorses doing better with taking her medications and also is happy to report her blood pressure is better. Still on the high side but it is coming down with the new medication regimen. The patient is experiencing dizziness at times but it is better. Education and support given. Her specialist is working on adjusting her thyroid medication as this has been a current and on going problem for the patient.  03-09-2020: The patient is seeing a specialist and they are helping with managing her Hypothyroidism.  She has upcoming appointment on 04-06-2020 for follow up. She also says her COPD is sometimes worse than other times but she is doing what she can to monitor for exacerbations. She is pacing her activity and being safe. Her son and family have [place] but she has not been with them. She is wanting to get the booster. Education and support given. 04-07-2020: Incoming call from the patient.  The patient states last night she got something stuck in her throat and she can not get it to go down. The patient states she cannot swallow and tried to call the office but could not wait on the line any longer.  Advised the patient she may need to go to the ER or urgent care. Advised that this was not the proper outlet to contact provider but the RNCM would send a message to the provider about expressed concern. Instant message to the provider and office staff asking for  recommendations. Message received back that the patient is instructed to go to the ER and the CMA was currently on the phone with the patient advising the patient of the course of action to take. Will continue to follow.  . Assessed patient understanding of disease states- education and support . Assessed patient's education and care coordination needs- the patient is receptive to the CCM team working with her to help manage her health and well being.   . The patient also wants recommendations from the pcp about a "coopertone belt" she has purchased for her back. She ordered the wrong size and is having to send that one back and get another one. She is wanting something that will help with her back because it is weak where she has not been exercising like she should.  Will collaborate with pcp and ask for recommendations. 03-09-2020: The patient states she is still dealing with back pain but she is doing better. She feels when everything is back on track she will feel a lot better. She is seeing a pain  specialist for her back pain and discomfort.  . Provided disease specific education to patient - education on taking medications as prescribed, keeping a record of blood pressure readings, adherence to a Heart Healthy diet . Assessed the patient mental health. The patient is so tired of being in her home but is excited about an upcoming trip to [place] to surprise her aunt. She feels like it will be good for her to get out of her house and [place]. 03-09-2020: The patient states that she is getting more involved with her church and they are helping her with socialization.  She wants to be a part of a social system that helps her with her social needs. Encouraged the patient to utilize her support systems and the CCM team for help and support. She feels she is doing well with balancing her health and well being. Does want to stay in control of her health and changes going on with her health.  [name] with  appropriate clinical care team members regarding patient needs: Pharmacy referral for help with medication  to help with chronic constipation. and LCSW for help with stress and anxiety  Patient Self Care Activities related to HTN, COPD, and Hypothyroidism  :  . Patient is unable to independently self-manage chronic health conditions  Please see past updates related to this goal by clicking on the "Past Updates" button in the selected goal           Plan:   The care management team will reach out to the patient again over the next 30 to 60 days.   Noreene Larsson RN, MSN, [place] Mobile: [phone]

## 2020-04-07 NOTE — Patient Instructions (Signed)
Visit Information  Goals Addressed            This Visit's Progress   . RNCM: PT- "I have not been taking my medication like I should" I need to do better.       CARE PLAN ENTRY (see longtitudinal plan of care for additional care plan information)  Current Barriers:  . Chronic Disease Management support, education, and care coordination needs related to HTN, COPD, and Hypothyroidism  . Financial Barriers   Clinical Goal(s) related to HTN, COPD, and Hypothyroidism :  Over the next 120 days, patient will:  . Work with the care management team to address educational, disease management, and care coordination needs  . Begin or continue self health monitoring activities as directed today Measure and record blood pressure 5 times per week and adhere to a Heart Healthy Diet . Call provider office for new or worsened signs and symptoms Blood pressure findings outside established parameters and New or worsened symptom related to COPD or Hypothyroidism  . Call care management team with questions or concerns . Verbalize basic understanding of patient centered plan of care established today  Interventions related to HTN, COPD, and Hypothyroidism  :  . Evaluation of current treatment plans and patient's adherence to plan as established by provider: Pt saw pcp recently and was evaluated for concerns. The patient has not always been taking her medications as prescribed. The patient is committed to changing her habits and getting on track with her health and well being.  The patient endorses doing better with taking her medications and also is happy to report her blood pressure is better. Still on the high side but it is coming down with the new medication regimen. The patient is experiencing dizziness at times but it is better. Education and support given. Her specialist is working on adjusting her thyroid medication as this has been a current and on going problem for the patient.  03-09-2020: The patient  is seeing a specialist and they are helping with managing her Hypothyroidism.  She has upcoming appointment on 04-06-2020 for follow up. She also says her COPD is sometimes worse than other times but she is doing what she can to monitor for exacerbations. She is pacing her activity and being safe. Her son and family have [place] but she has not been with them. She is wanting to get the booster. Education and support given. 04-07-2020: Incoming call from the patient.  The patient states last night she got something stuck in her throat and she can not get it to go down. The patient states she cannot swallow and tried to call the office but could not wait on the line any longer.  Advised the patient she may need to go to the ER or urgent care. Advised that this was not the proper outlet to contact provider but the RNCM would send a message to the provider about expressed concern. Instant message to the provider and office staff asking for recommendations. Message received back that the patient is instructed to go to the ER and the CMA was currently on the phone with the patient advising the patient of the course of action to take. Will continue to follow.  . Assessed patient understanding of disease states- education and support . Assessed patient's education and care coordination needs- the patient is receptive to the CCM team working with her to help manage her health and well being.   . The patient also wants recommendations from the pcp about  a "coopertone belt" she has purchased for her back. She ordered the wrong size and is having to send that one back and get another one. She is wanting something that will help with her back because it is weak where she has not been exercising like she should.  Will collaborate with pcp and ask for recommendations. 03-09-2020: The patient states she is still dealing with back pain but she is doing better. She feels when everything is back on track she will feel a lot better.  She is seeing a pain specialist for her back pain and discomfort.  . Provided disease specific education to patient - education on taking medications as prescribed, keeping a record of blood pressure readings, adherence to a Heart Healthy diet . Assessed the patient mental health. The patient is so tired of being in her home but is excited about an upcoming trip to [place] to surprise her aunt. She feels like it will be good for her to get out of her house and [place]. 03-09-2020: The patient states that she is getting more involved with her church and they are helping her with socialization.  She wants to be a part of a social system that helps her with her social needs. Encouraged the patient to utilize her support systems and the CCM team for help and support. She feels she is doing well with balancing her health and well being. Does want to stay in control of her health and changes going on with her health.  [name] with appropriate clinical care team members regarding patient needs: Pharmacy referral for help with medication  to help with chronic constipation. and LCSW for help with stress and anxiety  Patient Self Care Activities related to HTN, COPD, and Hypothyroidism  :  . Patient is unable to independently self-manage chronic health conditions  Please see past updates related to this goal by clicking on the "Past Updates" button in the selected goal         Verbalized understanding. No printed copy needed  The care management team will reach out to the patient again over the next 30 to 60 days.   Vanita Ingles

## 2020-04-08 ENCOUNTER — Encounter: Payer: Self-pay | Admitting: Gastroenterology

## 2020-04-20 ENCOUNTER — Telehealth: Payer: Self-pay

## 2020-04-20 ENCOUNTER — Telehealth: Payer: Self-pay | Admitting: General Practice

## 2020-04-20 NOTE — Telephone Encounter (Signed)
  Chronic Care Management   Outreach Note  04/20/2020 Name: Pamela Romero MRN: [id number] DOB: 06/12/1950  Referred by: [name], FNP Reason for referral : Appointment (RNCM: Follow up call for Chronic Disease Management and Care Coordination Needs)   An unsuccessful telephone outreach was attempted today. The patient was referred to the case management team for assistance with care management and care coordination.   Follow Up Plan: A HIPAA compliant phone message was left for the patient providing contact information and requesting a return call.   Noreene Larsson RN, MSN, [place] Mobile: [phone]

## 2020-04-28 ENCOUNTER — Telehealth: Payer: Self-pay | Admitting: Licensed Clinical Social Worker

## 2020-04-28 ENCOUNTER — Telehealth: Payer: Self-pay

## 2020-04-28 NOTE — Telephone Encounter (Signed)
  Chronic Care Management    Clinical Social Work General Follow Up Note  04/28/2020 Name: Pamela Romero MRN: [id number] DOB: 02-Jan-1951  Pamela Romero is a 70 y.o. year old female who is a primary care patient of [name], [name], FNP. The CCM team was consulted for assistance with [employer] .   Review of patient status, including review of consultants reports, relevant laboratory and other test results, and collaboration with appropriate care team members and the patient's provider was performed as part of comprehensive patient evaluation and provision of chronic care management services.    LCSW completed CCM outreach attempt today but was unable to reach patient successfully. A HIPPA compliant voice message was left encouraging patient to return call once available. LCSW will ask Scheduling Care Guide to reschedule CCM SW appointment with patient as well.   Outpatient Encounter Medications as of 04/28/2020  Medication Sig Note  . albuterol (PROAIR HFA) 108 (90 Base) MCG/ACT inhaler Inhale 1-2 puffs into the lungs every 6 (six) hours as needed for wheezing or shortness of breath.   [name] alendronate (FOSAMAX) 70 MG tablet Take 1 tablet (70 mg total) by mouth every 7 (seven) days. Take with a full glass of water on an empty stomach. 12/09/2019: Taking on Fridays  . docusate sodium (COLACE) 100 MG capsule Take 1 capsule (100 mg total) by mouth 2 (two) times daily as needed.   [name] esomeprazole (NEXIUM) 40 MG capsule Take 40 mg by mouth daily.   [name] gabapentin (NEURONTIN) 100 MG capsule Take 1-3 capsules (100-300 mg total) by mouth at bedtime. Follow written titration schedule.   [name] HYDROcodone-acetaminophen (NORCO) 7.5-325 MG tablet Take 1 tablet by mouth every 6 (six) hours as needed for severe pain. Must last 30 days.   [name] levothyroxine (SYNTHROID) 137 MCG tablet TAKE 1 TABLET (137 MCG TOTAL) BY MOUTH DAILY BEFORE BREAKFAST.   [name] linaclotide (LINZESS) 145 MCG CAPS capsule Take 1 capsule (145  mcg total) by mouth daily before breakfast.   . lisinopril-hydrochlorothiazide (ZESTORETIC) 20-12.5 MG tablet Take 1 tablet by mouth daily.   . montelukast (SINGULAIR) 10 MG tablet Take 1 tablet (10 mg total) by mouth at bedtime.   [name] omeprazole (PRILOSEC) 20 MG capsule Take 2 capsules (40 mg total) by mouth daily.   . polyethylene glycol powder (GLYCOLAX/MIRALAX) 17 GM/SCOOP powder Take 17 g by mouth daily as needed for mild constipation or moderate constipation.   . senna-docusate (SENNA-S) 8.6-50 MG tablet Take 1 tablet by mouth 2 (two) times daily as needed for mild constipation.   . sertraline (ZOLOFT) 25 MG tablet Take 1 tablet by mouth daily.    No facility-administered encounter medications on file as of 04/28/2020.    Follow Up Plan: [facility] will reach out to patient to reschedule appointment.   [name], BSW, MSW, [email or web address] Phone: [phone]

## 2020-05-01 ENCOUNTER — Other Ambulatory Visit: Payer: Self-pay | Admitting: Family Medicine

## 2020-05-01 DIAGNOSIS — I10 Essential (primary) hypertension: Secondary | ICD-10-CM

## 2020-05-04 ENCOUNTER — Telehealth: Payer: Self-pay

## 2020-05-04 NOTE — Chronic Care Management (AMB) (Signed)
  Care Management   Note  05/04/2020 Name: Pamela Romero MRN: [id number] DOB: February 11, 1951  Pamela Romero is a 70 y.o. year old female who is a primary care patient of [name], [name], FNP and is actively engaged with the care management team. I reached out to [name] by phone today to assist with re-scheduling a follow up visit with the RN Case Manager Licensed Clinical Social Worker  Follow up plan: Unsuccessful telephone outreach attempt made. A HIPAA compliant phone message was left for the patient providing contact information and requesting a return call.  The care management team will reach out to the patient again over the next 7 days.  If patient returns call to provider office, please advise to call [place]  at [place], [place], [place] [facility]  [place], [place] [address] Direct Dial: [phone] [name]

## 2020-05-06 ENCOUNTER — Ambulatory Visit: Payer: Medicare Other | Admitting: Pharmacist

## 2020-05-06 DIAGNOSIS — E039 Hypothyroidism, unspecified: Secondary | ICD-10-CM

## 2020-05-06 DIAGNOSIS — I10 Essential (primary) hypertension: Secondary | ICD-10-CM

## 2020-05-06 NOTE — Chronic Care Management (AMB) (Signed)
Chronic Care Management Pharmacy Note  05/06/2020 Name:  Pamela Romero MRN:  [id number] DOB:  [birth year]  Subjective: Pamela Romero is an 70 y.o. year old female who is a primary patient of [name], [name], FNP.  The CCM team was consulted for assistance with disease management and care coordination needs.    Engaged with patient by telephone for follow up visit in response to provider referral for pharmacy case management and/or care coordination services.   During call, patient reports that she has to hang up for call from work. Return call to patient and schedule appointment to complete medication review.  Consent to Services:  The patient was given information about Chronic Care Management services, agreed to services, and gave verbal consent prior to initiation of services.  Please see initial visit note for detailed documentation.   Objective:  Lab Results  Component Value Date   CREATININE 0.82 04/07/2020   CREATININE 1.09 (H) 01/02/2020   CREATININE 0.87 08/30/2019    Lab Results  Component Value Date   HGBA1C 5.1 11/06/2018       Component Value Date/Time   CHOL 186 08/30/2019 1131   TRIG 132 08/30/2019 1131   HDL 43 (L) 08/30/2019 1131   CHOLHDL 4.3 08/30/2019 1131   VLDL 42.0 (H) 05/14/2014 0918   LDLCALC 118 (H) 08/30/2019 1131   LDLDIRECT 144.0 01/21/2015 0856    BP Readings from Last 3 Encounters:  04/07/20 (!) 154/88  03/30/20 (!) 168/96  02/06/20 112/87    Assessment: Review of patient past medical history, allergies, medications, health status, including review of consultants reports, laboratory and other test data, was performed as part of comprehensive evaluation and provision of chronic care management services.   SDOH:  (Social Determinants of Health) assessments and interventions performed: none   CCM Care Plan  No Known Allergies  Medications Reviewed Today    Reviewed by Crist Fat, RN (Registered Nurse) on 04/07/20 at  1300  Med List Status: <None>  Medication Order Taking? Sig Documenting Provider Last Dose Status Informant  albuterol (PROAIR HFA) 108 (90 Base) MCG/ACT inhaler [id number]  Inhale 1-2 puffs into the lungs every 6 (six) hours as needed for wheezing or shortness of breath. [name], FNP  Active   alendronate (FOSAMAX) 70 MG tablet [id number]  Take 1 tablet (70 mg total) by mouth every 7 (seven) days. Take with a full glass of water on an empty stomach. [name], FNP  Active            Med Note Winfield Cunas, Orla Estrin A   Mon Dec 09, 2019  2:58 PM) Taking on Fridays  docusate sodium (COLACE) 100 MG capsule [id number]  Take 1 capsule (100 mg total) by mouth 2 (two) times daily as needed. [name], FNP  Active   esomeprazole (NEXIUM) 40 MG capsule [id number]  Take 40 mg by mouth daily. [provider]  Active   gabapentin (NEURONTIN) 100 MG capsule [id number]  Take 1-3 capsules (100-300 mg total) by mouth at bedtime. Follow written titration schedule. Gillis Santa, MD  Expired 03/07/20 2359   HYDROcodone-acetaminophen (NORCO) 7.5-325 MG tablet [id number]  Take 1 tablet by mouth every 6 (six) hours as needed for severe pain. Must last 30 days. Gillis Santa, MD  Active   HYDROcodone-acetaminophen (NORCO) 7.5-325 MG tablet [id number]  Take 1 tablet by mouth every 6 (six) hours as needed for severe pain. Must last 30 days. Gillis Santa, MD  Active  levothyroxine (SYNTHROID) 137 MCG tablet [id number]  TAKE 1 TABLET (137 MCG TOTAL) BY MOUTH DAILY BEFORE BREAKFAST. [name], FNP  Active   linaclotide Rolan Lipa) 145 MCG CAPS capsule [id number]  Take 1 capsule (145 mcg total) by mouth daily before breakfast. [name], FNP  Active   lisinopril-hydrochlorothiazide (ZESTORETIC) 20-12.5 MG tablet [id number]  Take 1 tablet by mouth daily. [name], [name], FNP  Active   montelukast (SINGULAIR) 10 MG tablet [id number]  Take 1 tablet (10 mg total) by mouth at bedtime. Mikey College,  NP  Active   polyethylene glycol powder [facility]) 17 GM/SCOOP powder [id number]  Take 17 g by mouth daily as needed for mild constipation or moderate constipation. Karamalegos, Devonne Doughty, DO  Active   senna-docusate (SENNA-S) 8.6-50 MG tablet [id number]  Take 1 tablet by mouth 2 (two) times daily as needed for mild constipation. [name], FNP  Active   sertraline (ZOLOFT) 25 MG tablet [id number]  Take 1 tablet by mouth daily. [provider]  Active   Med List Note Landis Martins, RN 11/21/18 1020): UDS 07/30/18 Serum drug screen 05/31/18 MR 01-27-2019          Patient Active Problem List   Diagnosis Date Noted  . Therapeutic opioid-induced constipation (OIC) 03/30/2020  . Screening for colon cancer 03/30/2020  . Generalized body aches 01/06/2020  . COPD exacerbation ([place]) 01/06/2020  . Muscle cramp 01/02/2020  . Need for immunization against influenza 01/02/2020  . Osteoporosis 11/19/2019  . Fatigue 08/30/2019  . Chronic migraine 07/01/2019  . Presbycusis of both ears 06/10/2019  . Memory loss 06/10/2019  . Chronic obstructive pulmonary disease with acute exacerbation ([place]) 01/17/2019  . Bilateral primary osteoarthritis of knee 12/27/2017  . Chronic pain of right ankle 12/27/2017  . Primary osteoarthritis of right ankle 12/27/2017  . Acute left-sided low back pain without sciatica 07/19/2017  . Chronic pain of left knee 07/17/2017  . Plantar fascia syndrome 07/17/2017  . Shortness of breath 04/17/2017  . Cellulitis of left lower limb 04/17/2017  . Edema, unspecified 04/17/2017  . 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 Chronic kidney disease, unspecified 04/17/2017  . Anxiety disorder, unspecified 04/17/2017  . Nicotine dependence, cigarettes, uncomplicated 93/81/8299  . Obesity, unspecified 12/18/2013  . Annual physical exam 12/18/2013  . S/P breast biopsy 12/18/2013  . Pes  planus of both feet 12/18/2013  . Cough 09/16/2013  . Dysphagia 09/16/2013  . Constipation 10/02/2012  . Transient vision disturbance, bilateral 04/02/2012  . Hyperlipidemia LDL goal <100 02/08/2012  . 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 Tobacco abuse counseling 04/29/2011  . Hypothyroidism 04/29/2011  . Essential hypertension 04/29/2011  . Esophageal reflux 04/29/2011  . Osteoarthritis   . Endometrial cancer ([place])     Conditions to be addressed/monitored: HTN, COPD and hypothyroidism  Care Plan : General Pharmacy (Adult)  Updates made by Vella Raring, [place] since 05/06/2020 12:00 AM    Problem: Disease Progression     Long-Range Goal: Disease Progression Prevented or Minimized   Start Date: 05/06/2020  Expected End Date: 08/04/2020  This Visit's Progress: On track  Priority: High  Note:   Current Barriers:  . Chronic Disease Management support, education, and care coordination needs related to HTN, COPD, and hypothyroidism . Financial Barriers . CCM team has been unable to maintain contact with patient. Today patient reports telephone issue has been resolved  Pharmacist Clinical Goal(s):  .  Over the next 90 days, patient will achieve adherence to monitoring guidelines and medication adherence to achieve therapeutic efficacy through collaboration with PharmD and provider.   Interventions: . 1:1 collaboration with [name], [name], FNP regarding development and update of comprehensive plan of care as evidenced by provider attestation and co-signature . Inter-disciplinary care team collaboration (see longitudinal plan of care) . Unable to perform complete medication review today. During call, patient reports that she has to hang up for call from work . Perform chart review. Note patient admitted to [facility] on 12/28 for difficulty swallowing and EGD performed. . Today patient reports she is doing well since had procedure to open up top of esophagus.   o Reports able to swallow food and medication and has instructions for when to reach out to GI provider  Hypertension . Current treatment: lisinopril-HCTZ 20-12.5 mg daily . Reports has been unable to monitor home BP recently due to broken monitor o Reports will obtain new upper arm monitor . Have counseled patient on BP monitoring technique  Constipation . Reports currently taking: Linzess 145 mcg every other day . Note patient chronically on opioid as managed by [facility] . Reports constipation now resolved; having regular bowel movements now . Reports planning to discuss further with pain management provider at appointment tomorrow . Note patient referred to [facility] by PCP on 12/20 for colonoscopy, but per review of chart, office was unable to reach patient. . Provide patient with phone number to  GI today and encourage her to call.   Patient Goals/Self-Care Activities . Over the next 90 days, patient will:  - take medications as prescribed - check blood pressure, document, and provide at future appointments  Follow Up Plan: Telephone follow up appointment with care management team member scheduled for: 05/08/2020 at 10:45 AM     Follow Up:  Patient agrees to Care Plan and Follow-up.  Harlow Asa, PharmD, [place] [phone]

## 2020-05-06 NOTE — Patient Instructions (Signed)
Visit Information  Care Plan : General Pharmacy (Adult)  Updates made by Vella Raring, [place] since 05/06/2020 12:00 AM    Problem: Disease Progression     Long-Range Goal: Disease Progression Prevented or Minimized   Start Date: 05/06/2020  Expected End Date: 08/04/2020  This Visit's Progress: On track  Priority: High  Note:   Current Barriers:  . Chronic Disease Management support, education, and care coordination needs related to HTN, COPD, and hypothyroidism . Financial Barriers . CCM team has been unable to maintain contact with patient. Today patient reports telephone issue has been resolved  Pharmacist Clinical Goal(s):  [name] Over the next 90 days, patient will achieve adherence to monitoring guidelines and medication adherence to achieve therapeutic efficacy through collaboration with PharmD and provider.   Interventions: . 1:1 collaboration with [name], [name], FNP regarding development and update of comprehensive plan of care as evidenced by provider attestation and co-signature . Inter-disciplinary care team collaboration (see longitudinal plan of care) . Unable to perform complete medication review today. During call, patient reports that she has to hang up for call from work . Perform chart review. Note patient admitted to [facility] on 12/28 for difficulty swallowing and EGD performed. . Today patient reports she is doing well since had procedure to open up top of esophagus.  o Reports able to swallow food and medication and has instructions for when to reach out to GI provider  Hypertension . Current treatment: lisinopril-HCTZ 20-12.5 mg daily . Reports has been unable to monitor home BP recently due to broken monitor o Reports will obtain new upper arm monitor . Have counseled patient on BP monitoring technique  Constipation . Reports currently taking: Linzess 145 mcg every other day . Note patient chronically on opioid as managed by [facility] . Reports constipation now resolved; having regular bowel movements now . Reports planning to discuss further with pain management provider at appointment tomorrow . Note patient referred to [facility] by PCP on 12/20 for colonoscopy, but per review of chart, office was unable to reach patient. . Provide patient with phone number to [place] [facility] today and encourage her to call.   Patient Goals/Self-Care Activities . Over the next 90 days, patient will:  - take medications as prescribed - check blood pressure, document, and provide at future appointments  Follow Up Plan: Telephone follow up appointment with care management team member scheduled for: 05/08/2020 at 10:45 AM       The patient verbalized understanding of instructions, educational materials, and care plan provided today and declined offer to receive copy of patient instructions, educational materials, and care plan.   Harlow Asa, PharmD, [place] [phone]

## 2020-05-07 ENCOUNTER — Ambulatory Visit
Payer: Medicare HMO | Attending: Student in an Organized Health Care Education/Training Program | Admitting: Student in an Organized Health Care Education/Training Program

## 2020-05-07 ENCOUNTER — Other Ambulatory Visit: Payer: Self-pay

## 2020-05-07 ENCOUNTER — Encounter: Payer: Self-pay | Admitting: Student in an Organized Health Care Education/Training Program

## 2020-05-07 VITALS — BP 131/104 | HR 86 | Temp 96.4°F | Resp 18 | Ht 68.5 in | Wt 210.0 lb

## 2020-05-07 DIAGNOSIS — G894 Chronic pain syndrome: Secondary | ICD-10-CM | POA: Diagnosis not present

## 2020-05-07 DIAGNOSIS — M722 Plantar fascial fibromatosis: Secondary | ICD-10-CM | POA: Insufficient documentation

## 2020-05-07 DIAGNOSIS — Z79891 Long term (current) use of opiate analgesic: Secondary | ICD-10-CM | POA: Diagnosis not present

## 2020-05-07 DIAGNOSIS — M5416 Radiculopathy, lumbar region: Secondary | ICD-10-CM | POA: Diagnosis not present

## 2020-05-07 DIAGNOSIS — M1712 Unilateral primary osteoarthritis, left knee: Secondary | ICD-10-CM | POA: Insufficient documentation

## 2020-05-07 DIAGNOSIS — G8929 Other chronic pain: Secondary | ICD-10-CM | POA: Diagnosis not present

## 2020-05-07 DIAGNOSIS — E669 Obesity, unspecified: Secondary | ICD-10-CM

## 2020-05-07 DIAGNOSIS — M170 Bilateral primary osteoarthritis of knee: Secondary | ICD-10-CM | POA: Diagnosis not present

## 2020-05-07 DIAGNOSIS — Z6830 Body mass index (BMI) 30.0-30.9, adult: Secondary | ICD-10-CM | POA: Insufficient documentation

## 2020-05-07 MED ORDER — HYDROCODONE-ACETAMINOPHEN 7.5-325 MG PO TABS: 1.0000 | ORAL_TABLET | Freq: Four times a day (QID) | ORAL | 0 refills | Status: AC | PRN

## 2020-05-07 MED ORDER — GABAPENTIN 100 MG PO CAPS: 100.0000 mg | ORAL_CAPSULE | Freq: Every day | ORAL | 5 refills | Status: DC

## 2020-05-07 MED ORDER — HYDROCODONE-ACETAMINOPHEN 7.5-325 MG PO TABS: 1.0000 | ORAL_TABLET | Freq: Four times a day (QID) | ORAL | 0 refills | Status: DC | PRN

## 2020-05-07 NOTE — Progress Notes (Signed)
PROVIDER NOTE: Information contained herein reflects review and annotations entered in association with encounter. Interpretation of such information and data should be left to medically-trained personnel. Information provided to patient can be located elsewhere in the medical record under "Patient Instructions". Document created using STT-dictation technology, any transcriptional errors that may result from process are unintentional.    Patient: Pamela Romero  Service Category: E/M  Provider: Gillis Santa, MD  DOB: 1950-08-12  DOS: 05/07/2020  Specialty: Interventional Pain Management  MRN: [id number]  Setting: Ambulatory outpatient  PCP: [name], FNP  Type: Established Patient    Referring Provider: [name], FNP  Location: Office  Delivery: Face-to-face     HPI  Pamela Romero, a 70 y.o. year old female, is here today because of her Chronic radicular lumbar pain [M54.16, G89.29]. Pamela Romero primary complain today is Knee Pain (left) Last encounter: My last encounter with her was on 04/01/2020. Pertinent problems: Ms. Das has Major depressive disorder, recurrent, mild ([place]); Chronic pain of left knee; Acute left-sided low back pain without sciatica; Bilateral primary osteoarthritis of knee; Chronic pain of right ankle; Primary osteoarthritis of right ankle; and Osteoporosis on their pertinent problem list. Pain Assessment: Severity of Chronic pain is reported as a 6 /10. Location: Knee Left/to left ankle. Onset: More than a month ago. Quality: Aching. Timing: Constant. Modifying factor(s): meds. Vitals:  height is 5' 8.5" (1.74 m) and weight is 210 lb (95.3 kg). Her temporal temperature is 96.4 F (35.8 C) (abnormal). Her blood pressure is 131/104 (abnormal) and her pulse is 86. Her respiration is 18 and oxygen saturation is 98%.   Reason for encounter: medication management.    No change in medical history since last visit.  Patient's pain is at baseline.  Patient  continues multimodal pain regimen as prescribed.  States that it provides pain relief and improvement in functional status.   Pharmacotherapy Assessment   Analgesic: Hydrocodone 7.5 mg 3 times daily as needed with an extra quantity 15/monthFor severe breakthrough pain    Monitoring: [place] PMP: PDMP reviewed during this encounter.       Pharmacotherapy: No side-effects or adverse reactions reported. Compliance: No problems identified. Effectiveness: Clinically acceptable.  [name], RN  05/07/2020  8:25 AM  Sign when Signing Visit Nursing Pain Medication Assessment:  Safety precautions to be maintained throughout the outpatient stay will include: orient to surroundings, keep bed in low position, maintain call bell within reach at all times, provide assistance with transfer out of bed and ambulation.  Medication Inspection Compliance: Pill count conducted under aseptic conditions, in front of the patient. Neither the pills nor the bottle was removed from the patient's sight at any time. Once count was completed pills were immediately returned to the patient in their original bottle.  Medication: Hydrocodone/APAP Pill/Patch Count: 33 of 105 pills remain Pill/Patch Appearance: Markings consistent with prescribed medication Bottle Appearance: Standard pharmacy container. Clearly labeled. Filled Date: 1 / 8 / 22 Last Medication intake:  Yesterday    UDS:  Summary  Date Value Ref Range Status  09/03/2019 Note  Final    Comment:    ==================================================================== [facility] (MW) ==================================================================== Test                             Result       Flag       Units Drug Present and Declared for Prescription Verification   Hydrocodone                    >  5263        EXPECTED   ng/mg creat   Hydromorphone                  309          EXPECTED   ng/mg creat   Dihydrocodeine                 987           EXPECTED   ng/mg creat   Norhydrocodone                 >2632        EXPECTED   ng/mg creat    Sources of hydrocodone include scheduled prescription medications.    Hydromorphone, dihydrocodeine and norhydrocodone are expected    metabolites of hydrocodone. Hydromorphone and dihydrocodeine are    also available as scheduled prescription medications. ==================================================================== Test                      Result    Flag   Units      Ref Range   Creatinine              190              mg/dL      >=20 ==================================================================== Declared Medications:  The flagging and interpretation on this report are based on the  following declared medications.  Unexpected results may arise from  inaccuracies in the declared medications.  **Note: The testing scope of this panel includes these medications:  Hydrocodone (Norco)  **Note: The testing scope of this panel does not include the  following reported medications:  Acetaminophen (Norco)  Albuterol (Proair HFA)  Baclofen (Lioresal)  Budesonide  Docusate (Colace)  Esomeprazole (Nexium)  Formoterol  Glycopyrrolate  Levothyroxine (Synthroid)  Lisinopril (Zestril)  Montelukast (Singulair)  Ondansetron (Zofran)  Polyethylene Glycol (MiraLAX)  Sumatriptan (Imitrex)  Verapamil ==================================================================== For clinical consultation, please call [phone]. ====================================================================      ROS  Constitutional: Denies any fever or chills Gastrointestinal: No reported hemesis, hematochezia, vomiting, or acute GI distress Musculoskeletal: Left knee pain Neurological: No reported episodes of acute onset apraxia, aphasia, dysarthria, agnosia, amnesia, paralysis, loss of coordination, or loss of consciousness  Medication Review  HYDROcodone-acetaminophen, albuterol, alendronate,  esomeprazole, gabapentin, levothyroxine, linaclotide, montelukast, and omeprazole  History Review  Allergy: Ms. Santarelli has No Known Allergies. Drug: Ms. Mastel  reports no history of drug use. Alcohol:  reports no history of alcohol use. Tobacco:  reports that she quit smoking about 1 years ago. She has a 25.00 pack-year smoking history. She has quit using smokeless tobacco. Social: Ms. Hrivnak  reports that she quit smoking about 1 years ago. She has a 25.00 pack-year smoking history. She has quit using smokeless tobacco. She reports that she does not drink alcohol and does not use drugs. Medical:  has a past medical history of Abnormal EKG, CAD (coronary artery disease), Cancer ([place]), COPD (chronic obstructive pulmonary disease) ([place]), Endometrial adenocarcinoma ([place]) (2011), Hyperlipidemia, Hypertension, Osteoarthritis, Reflux gastritis, Thyroid disease, tobacco abuse, and Vaginal delivery. Surgical: Ms. Amero  has a past surgical history that includes Thyroidectomy (2005); Cardiac catheterization (2006); Total abdominal hysterectomy; Breast surgery (Right, March 2014); Breast cyst aspiration (Right, 2014); and Esophagogastroduodenoscopy (N/A, 04/07/2020). Family: family history includes Alcohol abuse in her father, maternal aunt, and mother; Cancer in her brother, brother, brother, and maternal aunt; Heart disease in her brother.  Laboratory Chemistry Profile   Renal  Lab Results  Component Value Date   BUN 15 04/07/2020   CREATININE 0.82 04/07/2020   BCR 22 01/02/2020   GFR 60.34 01/21/2015   GFRAA 61 01/02/2020   GFRNONAA >60 04/07/2020     Hepatic Lab Results  Component Value Date   AST 14 01/02/2020   ALT 7 01/02/2020   ALBUMIN 4.4 08/29/2017   ALKPHOS 126 (H) 08/29/2017   HCVAB NEGATIVE 01/21/2015     Electrolytes Lab Results  Component Value Date   NA 140 04/07/2020   K 3.9 04/07/2020   CL 104 04/07/2020   CALCIUM 9.8 04/07/2020   MG 1.9 06/04/2018      Bone Lab Results  Component Value Date   VD25OH 26.66 (L) 05/14/2014     Inflammation (CRP: Acute Phase) (ESR: Chronic Phase) Lab Results  Component Value Date   ESRSEDRATE 19 03/30/2012       Note: Above Lab results reviewed.  Recent Imaging Review  DG Chest 2 View CLINICAL DATA:  Wheezing.  COPD.  EXAM: CHEST - 2 VIEW  COMPARISON:  11/09/2018.  02/09/2017.  FINDINGS: Mediastinum and hilar structures normal. Low lung volumes. Stable bibasilar atelectasis and or scarring. No focal alveolar infiltrate. No pleural effusion or pneumothorax. Heart size normal. Degenerative change thoracic spine.  IMPRESSION: Stable bibasilar atelectasis and or scarring. No focal infiltrate identified.  Electronically Signed   By: Marcello Moores  Register   On: 01/03/2020 09:38 Note: Reviewed        Physical Exam  General appearance: Well nourished, well developed, and well hydrated. In no apparent acute distress Mental status: Alert, oriented x 3 (person, place, & time)       Respiratory: No evidence of acute respiratory distress Eyes: PERLA Vitals: BP (!) 131/104   Pulse 86   Temp (!) 96.4 F (35.8 C) (Temporal)   Resp 18   Ht 5' 8.5" (1.74 m)   Wt 210 lb (95.3 kg)   SpO2 98%   BMI 31.47 kg/m  BMI: Estimated body mass index is 31.47 kg/m as calculated from the following:   Height as of this encounter: 5' 8.5" (1.74 m).   Weight as of this encounter: 210 lb (95.3 kg). Ideal: Ideal body weight: 65 kg (143 lb 6.5 oz) Adjusted ideal body weight: 77.1 kg (170 lb 0.7 oz)  Lower Extremity Exam    Side: Right lower extremity  Side: Left lower extremity  Stability: No instability observed          Stability: No instability observed          Skin & Extremity Inspection: Skin color, temperature, and hair growth are WNL. No peripheral edema or cyanosis. No masses, redness, swelling, asymmetry, or associated skin lesions. No contractures.  Skin & Extremity Inspection: Skin color, temperature,  and hair growth are WNL. No peripheral edema or cyanosis. No masses, redness, swelling, asymmetry, or associated skin lesions. No contractures.  Functional ROM: Pain restricted ROM for hip and knee joints          Functional ROM: Pain restricted ROM for hip and knee joints          Muscle Tone/Strength: Functionally intact. No obvious neuro-muscular anomalies detected.  Muscle Tone/Strength: Functionally intact. No obvious neuro-muscular anomalies detected.  Sensory (Neurological): Unimpaired        Sensory (Neurological): Arthropathic arthralgia        DTR: Patellar: deferred today Achilles: deferred today Plantar: deferred today  DTR: Patellar: deferred today Achilles: deferred today Plantar: deferred today  Palpation:  No palpable anomalies  Palpation: No palpable anomalies    Assessment   Status Diagnosis  Controlled Controlled Controlled 1. Chronic radicular lumbar pain (left)   2. Primary osteoarthritis of left knee   3. Bilateral primary osteoarthritis of knee   4. Chronic use of opiate for therapeutic purpose   5. Class 1 obesity without serious comorbidity with body mass index (BMI) of 30.0 to 30.9 in adult, unspecified obesity type   6. Plantar fascia syndrome   7. Chronic pain syndrome       Plan of Care   Ms. Catalina BETHANNIE IGLEHART has a current medication list which includes the following long-term medication(s): albuterol, levothyroxine, linaclotide, montelukast, omeprazole, and gabapentin.   Overall, Jeilyn is doing well.  No significant change in her medical history since her last visit.  She is currently in retirement but states that she is having a difficult time staying at home.  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  She continues her medications as prescribed, no change in dose.  She does find analgesic benefit with gabapentin that she takes as needed.  She also finds that she sleeps better when she takes that at night.  I recommend that she take  gabapentin 100 to 200 mg nightly every night and 300 mg when she is having a severe pain flare.  Patient endorsed understanding.  Follow-up in 3 months medication management.  Pharmacotherapy (Medications Ordered): Meds ordered this encounter  Medications  . HYDROcodone-acetaminophen (NORCO) 7.5-325 MG tablet    Sig: Take 1 tablet by mouth every 6 (six) hours as needed for severe pain. Must last 30 days.    Dispense:  105 tablet    Refill:  0    [place] STOP ACT - Not applicable. Fill one day early if pharmacy is closed on scheduled refill date.  [name] HYDROcodone-acetaminophen (NORCO) 7.5-325 MG tablet    Sig: Take 1 tablet by mouth every 6 (six) hours as needed for severe pain. Must last 30 days.    Dispense:  105 tablet    Refill:  0    [place] STOP ACT - Not applicable. Fill one day early if pharmacy is closed on scheduled refill date.  [name] HYDROcodone-acetaminophen (NORCO) 7.5-325 MG tablet    Sig: Take 1 tablet by mouth every 6 (six) hours as needed for severe pain. Must last 30 days.    Dispense:  105 tablet    Refill:  0    [place] STOP ACT - Not applicable. Fill one day early if pharmacy is closed on scheduled refill date.  . gabapentin (NEURONTIN) 100 MG capsule    Sig: Take 1-3 capsules (100-300 mg total) by mouth at bedtime. Follow written titration schedule.    Dispense:  90 capsule    Refill:  5    Fill one day early if pharmacy is closed on scheduled refill date. May substitute for generic if available.    Follow-up plan:   Return in about 3 months (around 08/05/2020) for Medication Management, in person.     Status post left genicular RFA 04/16/2018, status post right genicular RFA 03/12/2018.  Provided her with benefit.  Status post left knee intra-articular steroid injection #3 on 11/21/2018.   hyalgan left knee #1 on 04/17/19, #2 05/22/19, #3 07/03/2019, #4 08/28/19, #5 on 09/25/19         Recent Visits No visits were found meeting these conditions. Showing recent visits within past 90 days and  meeting all other  requirements Today's Visits Date Type Provider Dept  05/07/20 Office Visit Gillis Santa, MD [facility]  Showing today's visits and meeting all other requirements Future Appointments Date Type Provider Dept  08/04/20 Appointment Gillis Santa, MD [facility]  Showing future appointments within next 90 days and meeting all other requirements  I discussed the assessment and treatment plan with the patient. The patient was provided an opportunity to ask questions and all were answered. The patient agreed with the plan and demonstrated an understanding of the instructions.  Patient advised to call back or seek an in-person evaluation if the symptoms or condition worsens.  Duration of encounter: 30 minutes.  Note by: Gillis Santa, MD Date: 05/07/2020; Time: 8:46 AM

## 2020-05-07 NOTE — Progress Notes (Signed)
Nursing Pain Medication Assessment:  Safety precautions to be maintained throughout the outpatient stay will include: orient to surroundings, keep bed in low position, maintain call bell within reach at all times, provide assistance with transfer out of bed and ambulation.  Medication Inspection Compliance: Pill count conducted under aseptic conditions, in front of the patient. Neither the pills nor the bottle was removed from the patient's sight at any time. Once count was completed pills were immediately returned to the patient in their original bottle.  Medication: Hydrocodone/APAP Pill/Patch Count: 33 of 105 pills remain Pill/Patch Appearance: Markings consistent with prescribed medication Bottle Appearance: Standard pharmacy container. Clearly labeled. Filled Date: 1 / 8 / 22 Last Medication intake:  Yesterday

## 2020-05-08 ENCOUNTER — Telehealth: Payer: Self-pay

## 2020-05-08 ENCOUNTER — Telehealth: Payer: Self-pay | Admitting: Pharmacist

## 2020-05-08 NOTE — Telephone Encounter (Signed)
  Chronic Care Management   Outreach Note  05/08/2020 Name: KEIARAH ORLOWSKI MRN: [id number] DOB: Oct 22, 1950  Referred by: [name], FNP Reason for referral : No chief complaint on file.   Was unable to reach patient via telephone today and unable to leave a message as no voicemail picks up   Follow Up Plan: Will collaborate with Care Guide to outreach to schedule follow up with me  Harlow Asa, PharmD, [place] [facility] [phone]

## 2020-05-11 NOTE — Chronic Care Management (AMB) (Signed)
  Care Management   Note  05/11/2020 Name: Pamela Romero MRN: [id number] DOB: [birth year]  [name] is a 70 y.o. year old female who is a primary care patient of [name], [name], FNP and is actively engaged with the care management team. I reached out to [name] by phone today to assist with re-scheduling a follow up visit with the RN Case Manager Pharmacist Licensed Clinical Social Worker  Follow up plan: Unsuccessful telephone outreach attempt made. The care management team will reach out to the patient again over the next 7 days.  If patient returns call to provider office, please advise to call [facility]  at [place], [place], [place], [place] [address] Direct Dial: [phone] [email or web address] Website: [email or web address]

## 2020-05-21 NOTE — Telephone Encounter (Signed)
3rd unsuccessful outreach to reschedule all CCM clinicians

## 2020-05-21 NOTE — Chronic Care Management (AMB) (Signed)
  Care Management   Note  05/21/2020 Name: Pamela Romero MRN: [id number] DOB: [birth year]  Pamela Romero is a 70 y.o. year old female who is a primary care patient of Pamela Romero, Pamela Raider, FNP and is actively engaged with the care management team. I reached out to Pamela Romero by phone today to assist with re-scheduling a follow up visit with the RN Case Manager Pharmacist Licensed Clinical Social Worker  Follow up plan: Unable to make contact on outreach attempts x 3. PCP Pamela Skeeters, FNP  notified via routed documentation in medical record.   Pamela Romero, [place], [place], [place] [address] Direct Dial: [phone] [email or web address]

## 2020-06-22 ENCOUNTER — Ambulatory Visit: Payer: Medicare HMO

## 2020-06-30 ENCOUNTER — Encounter: Payer: Self-pay | Admitting: Unknown Physician Specialty

## 2020-06-30 ENCOUNTER — Ambulatory Visit (INDEPENDENT_AMBULATORY_CARE_PROVIDER_SITE_OTHER): Payer: Medicare HMO

## 2020-06-30 ENCOUNTER — Other Ambulatory Visit: Payer: Self-pay

## 2020-06-30 ENCOUNTER — Other Ambulatory Visit: Payer: Self-pay | Admitting: Gastroenterology

## 2020-06-30 VITALS — BP 100/60 | HR 82 | Temp 97.1°F | Ht 68.0 in | Wt 214.0 lb

## 2020-06-30 DIAGNOSIS — Z111 Encounter for screening for respiratory tuberculosis: Secondary | ICD-10-CM

## 2020-07-01 NOTE — Progress Notes (Signed)
Nurse visit PPD placement only

## 2020-07-02 LAB — TB SKIN TEST
Induration: 0 mm
TB Skin Test: NEGATIVE

## 2020-08-04 ENCOUNTER — Other Ambulatory Visit: Payer: Self-pay

## 2020-08-04 ENCOUNTER — Ambulatory Visit
Payer: Medicare HMO | Attending: Student in an Organized Health Care Education/Training Program | Admitting: Student in an Organized Health Care Education/Training Program

## 2020-08-04 ENCOUNTER — Encounter: Payer: Self-pay | Admitting: Student in an Organized Health Care Education/Training Program

## 2020-08-04 VITALS — BP 107/68 | HR 86 | Temp 97.4°F | Resp 16 | Ht 68.5 in | Wt 210.0 lb

## 2020-08-04 DIAGNOSIS — M722 Plantar fascial fibromatosis: Secondary | ICD-10-CM | POA: Diagnosis not present

## 2020-08-04 DIAGNOSIS — G8929 Other chronic pain: Secondary | ICD-10-CM

## 2020-08-04 DIAGNOSIS — G894 Chronic pain syndrome: Secondary | ICD-10-CM | POA: Diagnosis not present

## 2020-08-04 DIAGNOSIS — Z6830 Body mass index (BMI) 30.0-30.9, adult: Secondary | ICD-10-CM

## 2020-08-04 DIAGNOSIS — M25571 Pain in right ankle and joints of right foot: Secondary | ICD-10-CM

## 2020-08-04 DIAGNOSIS — M1712 Unilateral primary osteoarthritis, left knee: Secondary | ICD-10-CM | POA: Insufficient documentation

## 2020-08-04 DIAGNOSIS — M19071 Primary osteoarthritis, right ankle and foot: Secondary | ICD-10-CM | POA: Diagnosis not present

## 2020-08-04 DIAGNOSIS — M170 Bilateral primary osteoarthritis of knee: Secondary | ICD-10-CM | POA: Diagnosis not present

## 2020-08-04 DIAGNOSIS — E669 Obesity, unspecified: Secondary | ICD-10-CM

## 2020-08-04 DIAGNOSIS — M5416 Radiculopathy, lumbar region: Secondary | ICD-10-CM | POA: Diagnosis not present

## 2020-08-04 DIAGNOSIS — Z79891 Long term (current) use of opiate analgesic: Secondary | ICD-10-CM | POA: Diagnosis not present

## 2020-08-04 MED ORDER — GABAPENTIN 100 MG PO CAPS
100.0000 mg | ORAL_CAPSULE | Freq: Every day | ORAL | 5 refills | Status: DC
Start: 2020-08-04 — End: 2020-11-03

## 2020-08-04 MED ORDER — HYDROCODONE-ACETAMINOPHEN 7.5-325 MG PO TABS: 1.0000 | ORAL_TABLET | Freq: Four times a day (QID) | ORAL | 0 refills | Status: AC | PRN

## 2020-08-04 MED ORDER — HYDROCODONE-ACETAMINOPHEN 7.5-325 MG PO TABS: 1.0000 | ORAL_TABLET | Freq: Four times a day (QID) | ORAL | 0 refills | Status: DC | PRN

## 2020-08-04 NOTE — Progress Notes (Signed)
Nursing Pain Medication Assessment:  Safety precautions to be maintained throughout the outpatient stay will include: orient to surroundings, keep bed in low position, maintain call bell within reach at all times, provide assistance with transfer out of bed and ambulation.  Medication Inspection Compliance: Pill count conducted under aseptic conditions, in front of the patient. Neither the pills nor the bottle was removed from the patient's sight at any time. Once count was completed pills were immediately returned to the patient in their original bottle.  Medication: Hydrocodone/APAP Pill/Patch Count: 46 of 105 pills remain Pill/Patch Appearance: Markings consistent with prescribed medication Bottle Appearance: Standard pharmacy container. Clearly labeled. Filled Date: 04 / 08 / 2022 Last Medication intake:  Today

## 2020-08-04 NOTE — Progress Notes (Signed)
PROVIDER NOTE: Information contained herein reflects review and annotations entered in association with encounter. Interpretation of such information and data should be left to medically-trained personnel. Information provided to patient can be located elsewhere in the medical record under "Patient Instructions". Document created using STT-dictation technology, any transcriptional errors that may result from process are unintentional.    Patient: Pamela Romero  Service Category: E/M  Provider: Gillis Santa, MD  DOB: 04/27/1950  DOS: 08/04/2020  Specialty: Interventional Pain Management  MRN: [id number]  Setting: Ambulatory outpatient  PCP: [name], FNP  Type: Established Patient    Referring Provider: [name], FNP  Location: Office  Delivery: Face-to-face     HPI  [name], a 70 y.o. year old female, is here today because of her Primary osteoarthritis of left knee [M17.12]. Ms. Minich primary complain today is Knee Pain (left) Last encounter: My last encounter with her was on 05/07/2020. Pertinent problems: [name] has Major depressive disorder, recurrent, mild ([place]); Chronic pain of left knee; Acute left-sided low back pain without sciatica; Bilateral primary osteoarthritis of knee; Chronic pain of right ankle; Primary osteoarthritis of right ankle; and Osteoporosis on their pertinent problem list. Pain Assessment: Severity of Chronic pain is reported as a 5 /10. Location: Knee Left/to left ankle. Onset: More than a month ago. Quality: Aching,Burning. Timing: Constant. Modifying factor(s): meds. Vitals:  height is 5' 8.5" (1.74 m) and weight is 210 lb (95.3 kg). Her temporal temperature is 97.4 F (36.3 C) (abnormal). Her blood pressure is 107/68 and her pulse is 86. Her respiration is 16 and oxygen saturation is 100%.   Reason for encounter: medication management.    Patient follows up today for medication management as well as worsening left knee pain with  weightbearing.  She has severe left knee osteoarthritis.  She is the caregiver of 2 individuals that requires her to help them with lifting and ADLs.  One of them is over 250 pounds of the patient states that puts added stress on her back and knees when she is helping them out.  She is over 2 years out from her left knee genicular nerve radiofrequency ablation.  Given increase in left knee pain, recommend repeat genicular nerve RFA for pain management related to knee osteoarthritis.  Risks and benefits reviewed and patient would like to proceed.  I will also refill her hydrocodone and gabapentin as below.  She states that gabapentin does help her sleep at night and also helps with her pain.  No change in dose.  Pharmacotherapy Assessment   Analgesic: Hydrocodone 7.5 mg 3 times daily as needed with an extra quantity 15/month (#105)For severe breakthrough pain    Monitoring: [place] PMP: PDMP not reviewed this encounter.       Pharmacotherapy: No side-effects or adverse reactions reported. Compliance: No problems identified. Effectiveness: Clinically acceptable.  [name], RN  08/04/2020  8:37 AM  Sign when Signing Visit Nursing Pain Medication Assessment:  Safety precautions to be maintained throughout the outpatient stay will include: orient to surroundings, keep bed in low position, maintain call bell within reach at all times, provide assistance with transfer out of bed and ambulation.  Medication Inspection Compliance: Pill count conducted under aseptic conditions, in front of the patient. Neither the pills nor the bottle was removed from the patient's sight at any time. Once count was completed pills were immediately returned to the patient in their original bottle.  Medication: Hydrocodone/APAP Pill/Patch Count: 46 of 105 pills  remain Pill/Patch Appearance: Markings consistent with prescribed medication Bottle Appearance: Standard pharmacy container. Clearly labeled. Filled Date: 04 / 08 /  2022 Last Medication intake:  Today    UDS:  Summary  Date Value Ref Range Status  09/03/2019 Note  Final    Comment:    ==================================================================== [facility] (MW) ==================================================================== Test                             Result       Flag       Units Drug Present and Declared for Prescription Verification   Hydrocodone                    >5263        EXPECTED   ng/mg creat   Hydromorphone                  309          EXPECTED   ng/mg creat   Dihydrocodeine                 987          EXPECTED   ng/mg creat   Norhydrocodone                 >2632        EXPECTED   ng/mg creat    Sources of hydrocodone include scheduled prescription medications.    Hydromorphone, dihydrocodeine and norhydrocodone are expected    metabolites of hydrocodone. Hydromorphone and dihydrocodeine are    also available as scheduled prescription medications. ==================================================================== Test                      Result    Flag   Units      Ref Range   Creatinine              190              mg/dL      >=20 ==================================================================== Declared Medications:  The flagging and interpretation on this report are based on the  following declared medications.  Unexpected results may arise from  inaccuracies in the declared medications.  **Note: The testing scope of this panel includes these medications:  Hydrocodone (Norco)  **Note: The testing scope of this panel does not include the  following reported medications:  Acetaminophen (Norco)  Albuterol (Proair HFA)  Baclofen (Lioresal)  Budesonide  Docusate (Colace)  Esomeprazole (Nexium)  Formoterol  Glycopyrrolate  Levothyroxine (Synthroid)  Lisinopril (Zestril)  Montelukast (Singulair)  Ondansetron (Zofran)  Polyethylene Glycol (MiraLAX)  Sumatriptan (Imitrex)   Verapamil ==================================================================== For clinical consultation, please call [phone]. ====================================================================      ROS  Constitutional: Denies any fever or chills Gastrointestinal: No reported hemesis, hematochezia, vomiting, or acute GI distress Musculoskeletal: Left knee osteoarthritis Neurological: No reported episodes of acute onset apraxia, aphasia, dysarthria, agnosia, amnesia, paralysis, loss of coordination, or loss of consciousness  Medication Review  HYDROcodone-acetaminophen, albuterol, alendronate, esomeprazole, gabapentin, levothyroxine, linaclotide, montelukast, and omeprazole  History Review  Allergy: [name] has No Known Allergies. Drug: [name]  reports no history of drug use. Alcohol:  reports no history of alcohol use. Tobacco:  reports that she quit smoking about 2 years ago. She has a 25.00 pack-year smoking history. She has quit using smokeless tobacco. Social: [name]  reports that she quit smoking about 2 years ago. She  has a 25.00 pack-year smoking history. She has quit using smokeless tobacco. She reports that she does not drink alcohol and does not use drugs. Medical:  has a past medical history of Abnormal EKG, CAD (coronary artery disease), Cancer ([place]), COPD (chronic obstructive pulmonary disease) ([place]), Endometrial adenocarcinoma ([place]) (2011), Hyperlipidemia, Hypertension, Osteoarthritis, Reflux gastritis, Thyroid disease, tobacco abuse, and Vaginal delivery. Surgical: [name]  has a past surgical history that includes Thyroidectomy (2005); Cardiac catheterization (2006); Total abdominal hysterectomy; Breast surgery (Right, March 2014); Breast cyst aspiration (Right, 2014); and Esophagogastroduodenoscopy (N/A, 04/07/2020). Family: family history includes Alcohol abuse in her father, maternal aunt, and mother; Cancer in her brother, brother, brother,  and maternal aunt; Heart disease in her brother.  Laboratory Chemistry Profile   Renal Lab Results  Component Value Date   BUN 15 04/07/2020   CREATININE 0.82 04/07/2020   BCR 22 01/02/2020   GFR 60.34 01/21/2015   GFRAA 61 01/02/2020   GFRNONAA >60 04/07/2020     Hepatic Lab Results  Component Value Date   AST 14 01/02/2020   ALT 7 01/02/2020   ALBUMIN 4.4 08/29/2017   ALKPHOS 126 (H) 08/29/2017   HCVAB NEGATIVE 01/21/2015     Electrolytes Lab Results  Component Value Date   NA 140 04/07/2020   K 3.9 04/07/2020   CL 104 04/07/2020   CALCIUM 9.8 04/07/2020   MG 1.9 06/04/2018     Bone Lab Results  Component Value Date   VD25OH 26.66 (L) 05/14/2014     Inflammation (CRP: Acute Phase) (ESR: Chronic Phase) Lab Results  Component Value Date   ESRSEDRATE 19 03/30/2012       Note: Above Lab results reviewed.  Recent Imaging Review  DG Chest 2 View CLINICAL DATA:  Wheezing.  COPD.  EXAM: CHEST - 2 VIEW  COMPARISON:  11/09/2018.  02/09/2017.  FINDINGS: Mediastinum and hilar structures normal. Low lung volumes. Stable bibasilar atelectasis and or scarring. No focal alveolar infiltrate. No pleural effusion or pneumothorax. Heart size normal. Degenerative change thoracic spine.  IMPRESSION: Stable bibasilar atelectasis and or scarring. No focal infiltrate identified.  Electronically Signed   By: Marcello Moores  Register   On: 01/03/2020 09:38 Note: Reviewed        Physical Exam  General appearance: Well nourished, well developed, and well hydrated. In no apparent acute distress Mental status: Alert, oriented x 3 (person, place, & time)       Respiratory: No evidence of acute respiratory distress Eyes: PERLA Vitals: BP 107/68   Pulse 86   Temp (!) 97.4 F (36.3 C) (Temporal)   Resp 16   Ht 5' 8.5" (1.74 m)   Wt 210 lb (95.3 kg)   SpO2 100%   BMI 31.47 kg/m  BMI: Estimated body mass index is 31.47 kg/m as calculated from the following:   Height as of  this encounter: 5' 8.5" (1.74 m).   Weight as of this encounter: 210 lb (95.3 kg). Ideal: Ideal body weight: 65 kg (143 lb 6.5 oz) Adjusted ideal body weight: 77.1 kg (170 lb 0.7 oz)  Lower Extremity Exam    Side: Right lower extremity  Side: Left lower extremity  Stability: No instability observed          Stability: No instability observed          Skin & Extremity Inspection: Skin color, temperature, and hair growth are WNL. No peripheral edema or cyanosis. No masses, redness, swelling, asymmetry, or associated skin lesions. No contractures.  Skin &  Extremity Inspection: Skin color, temperature, and hair growth are WNL. No peripheral edema or cyanosis. No masses, redness, swelling, asymmetry, or associated skin lesions. No contractures.  Functional ROM: Unrestricted ROM                  Functional ROM: Pain restricted ROM for hip and knee joints          Muscle Tone/Strength: Functionally intact. No obvious neuro-muscular anomalies detected.  Muscle Tone/Strength: Functionally intact. No obvious neuro-muscular anomalies detected.  Sensory (Neurological): Unimpaired        Sensory (Neurological): Arthropathic arthralgia        DTR: Patellar: deferred today Achilles: deferred today Plantar: deferred today  DTR: Patellar: deferred today Achilles: deferred today Plantar: deferred today  Palpation: No palpable anomalies  Palpation: No palpable anomalies    Assessment   Status Diagnosis  Controlled Controlled Controlled 1. Primary osteoarthritis of left knee   2. Bilateral primary osteoarthritis of knee   3. Chronic use of opiate for therapeutic purpose   4. Chronic radicular lumbar pain (left)   5. Class 1 obesity without serious comorbidity with body mass index (BMI) of 30.0 to 30.9 in adult, unspecified obesity type   6. Plantar fascia syndrome   7. Chronic pain of right ankle   8. Primary osteoarthritis of right ankle   9. Chronic pain syndrome      Updated Problems: Problem   Chronic radicular lumbar pain (left)  Primary Osteoarthritis of Left Knee  Chronic Use of Opiate for Therapeutic Purpose  Chronic Pain Syndrome  Class 1 Obesity Without Serious Comorbidity With Body Mass Index (Bmi) of 30.0 to 30.9 in Adult    Plan of Care   [name] has a current medication list which includes the following long-term medication(s): albuterol, gabapentin, levothyroxine, linaclotide, montelukast, and omeprazole.  Pharmacotherapy (Medications Ordered): Meds ordered this encounter  Medications  . HYDROcodone-acetaminophen (NORCO) 7.5-325 MG tablet    Sig: Take 1 tablet by mouth every 6 (six) hours as needed for severe pain. Must last 30 days.    Dispense:  105 tablet    Refill:  0    [place] STOP ACT - Not applicable. Fill one day early if pharmacy is closed on scheduled refill date.  [name] HYDROcodone-acetaminophen (NORCO) 7.5-325 MG tablet    Sig: Take 1 tablet by mouth every 6 (six) hours as needed for severe pain. Must last 30 days.    Dispense:  105 tablet    Refill:  0    [place] STOP ACT - Not applicable. Fill one day early if pharmacy is closed on scheduled refill date.  [name] HYDROcodone-acetaminophen (NORCO) 7.5-325 MG tablet    Sig: Take 1 tablet by mouth every 6 (six) hours as needed for severe pain. Must last 30 days.    Dispense:  105 tablet    Refill:  0    [place] STOP ACT - Not applicable. Fill one day early if pharmacy is closed on scheduled refill date.  . gabapentin (NEURONTIN) 100 MG capsule    Sig: Take 1-3 capsules (100-300 mg total) by mouth at bedtime. Follow written titration schedule.    Dispense:  90 capsule    Refill:  5    Fill one day early if pharmacy is closed on scheduled refill date. May substitute for generic if available.   Orders:  Orders Placed This Encounter  Procedures  . Radiofrequency,Genicular    Standing Status:   Future    Standing Expiration Date:  08/04/2021    Scheduling Instructions:     Side(s):LEFT     Level(s):  Superior-Lateral, Superior-Medial, and Inferior-Medial Genicular Nerve(s)     Sedation: Patient's choice.     Scheduling Timeframe: As soon as pre-approved    Order Specific Question:   Where will this procedure be performed?    Answer:   [facility]   Follow-up plan:   Return in about 3 weeks (around 08/25/2020) for left knee genicular RFA.     Status post left genicular RFA 04/16/2018, status post right genicular RFA 03/12/2018.  Provided her with benefit.  Status post left knee intra-articular steroid injection #3 on 11/21/2018.   hyalgan left knee #1 on 04/17/19, #2 05/22/19, #3 07/03/2019, #4 08/28/19, #5 on 09/25/19          Recent Visits Date Type Provider Dept  05/07/20 Office Visit Gillis Santa, MD [facility]  Showing recent visits within past 90 days and meeting all other requirements Today's Visits Date Type Provider Dept  08/04/20 Office Visit Gillis Santa, MD [facility]  Showing today's visits and meeting all other requirements Future Appointments No visits were found meeting these conditions. Showing future appointments within next 90 days and meeting all other requirements  I discussed the assessment and treatment plan with the patient. The patient was provided an opportunity to ask questions and all were answered. The patient agreed with the plan and demonstrated an understanding of the instructions.  Patient advised to call back or seek an in-person evaluation if the symptoms or condition worsens.  Duration of encounter:36mnutes.  Note by: BGillis Santa MD Date: 08/04/2020; Time: 9:05 AM

## 2020-08-04 NOTE — Patient Instructions (Addendum)
Hydrocodone to last until 11/13/20 and gabapentin has been escribed to your pharmacy.   Moderate Conscious Sedation, Adult Sedation is the use of medicines to promote relaxation and to relieve discomfort and anxiety. Moderate conscious sedation is a type of sedation. Under moderate conscious sedation, you are less alert than normal, but you are still able to respond to instructions, touch, or both. Moderate conscious sedation is used during short medical and dental procedures. It is milder than deep sedation, which is a type of sedation under which you cannot be easily woken up. It is also milder than general anesthesia, which is the use of medicines to make you unconscious. Moderate conscious sedation allows you to return to your regular activities sooner. Tell a health care provider about:  Any allergies you have.  All medicines you are taking, including vitamins, herbs, eye drops, creams, and over-the-counter medicines.  Any use of steroids. This includes steroids taken by mouth or as a cream.  Any problems you or family members have had with sedatives and anesthetic medicines.  Any blood disorders you have.  Any surgeries you have had.  Any medical conditions you have, such as sleep apnea.  Whether you are pregnant or may be pregnant.  Any use of cigarettes, alcohol, marijuana, or drugs. What are the risks? Generally, this is a safe procedure. However, problems may occur, including:  Getting too much medicine (oversedation).  Nausea.  Allergic reaction to medicines.  Trouble breathing. If this happens, a breathing tube may be used. It will be removed when you are awake and breathing on your own.  Heart trouble.  Lung trouble.  Confusion that gets better with time (emergence delirium). What happens before the procedure? Staying hydrated Follow instructions from your health care provider about hydration, which may include:  Up to 2 hours before the procedure - you may  continue to drink clear liquids, such as water, clear fruit juice, black coffee, and plain tea. Eating and drinking restrictions Follow instructions from your health care provider about eating and drinking, which may include:  8 hours before the procedure - stop eating heavy meals or foods, such as meat, fried foods, or fatty foods.  6 hours before the procedure - stop eating light meals or foods, such as toast or cereal.  6 hours before the procedure - stop drinking milk or drinks that contain milk.  2 hours before the procedure - stop drinking clear liquids. Medicines Ask your health care provider about:  Changing or stopping your regular medicines. This is especially important if you are taking diabetes medicines or blood thinners.  Taking medicines such as aspirin and ibuprofen. These medicines can thin your blood. Do not take these medicines unless your health care provider tells you to take them.  Taking over-the-counter medicines, vitamins, herbs, and supplements. Tests and exams  You will have a physical exam.  You may have blood tests done to show how well: ? Your kidneys and liver work. ? Your blood clots. General instructions  Plan to have a responsible adult take you home from the hospital or clinic.  If you will be going home right after the procedure, plan to have a responsible adult care for you for the time you are told. This is important. What happens during the procedure?  You will be given the sedative. The sedative may be given: ? As a pill that you will swallow. It can also be inserted into the rectum. ? As a spray through the nose. ? As  an injection into the muscle. ? As an injection into the vein through an IV.  You may be given oxygen as needed.  Your breathing, heart rate, and blood pressure will be monitored during the procedure.  The medical or dental procedure will be done. The procedure may vary among health care providers and hospitals.    What happens after the procedure?  Your blood pressure, heart rate, breathing rate, and blood oxygen level will be monitored until you leave the hospital or clinic.  You will get fluids through your IV if needed.  Do not drive or operate machinery until your health care provider says that it is safe. Summary  Sedation is the use of medicines to promote relaxation and to relieve discomfort and anxiety. Moderate conscious sedation is a type of sedation that is used during short medical and dental procedures.  Tell the health care provider about any medical conditions that you have and about all the medicines that you are taking.  You will be given the sedative as a pill, a spray through the nose, an injection into the muscle, or an injection into the vein through an IV. Vital signs are monitored during the sedation.  Moderate conscious sedation allows you to return to your regular activities sooner. This information is not intended to replace advice given to you by your health care provider. Make sure you discuss any questions you have with your health care provider. Document Revised: 07/26/2019 Document Reviewed: 02/21/2019 Elsevier Patient Education  2021 [place]  What are the risk, side effects and possible complications? Generally speaking, most procedures are safe.  However, with any procedure there are risks, side effects, and the possibility of complications.  The risks and complications are dependent upon the sites that are lesioned, or the type of nerve block to be performed.  The closer the procedure is to the spine, the more serious the risks are.  Great care is taken when placing the radio frequency needles, block needles or lesioning probes, but sometimes complications can occur. 1. Infection: Any time there is an injection through the skin, there is a risk of infection.  This is why sterile conditions are used for these blocks.  There are  four possible types of infection. 1. Localized skin infection. 2. Central Nervous System Infection-This can be in the form of Meningitis, which can be deadly. 3. Epidural Infections-This can be in the form of an epidural abscess, which can cause pressure inside of the spine, causing compression of the spinal cord with subsequent paralysis. This would require an emergency surgery to decompress, and there are no guarantees that the patient would recover from the paralysis. 4. Discitis-This is an infection of the intervertebral discs.  It occurs in about 1% of discography procedures.  It is difficult to treat and it may lead to surgery.        2. Pain: the needles have to go through skin and soft tissues, will cause soreness.       3. Damage to internal structures:  The nerves to be lesioned may be near blood vessels or    other nerves which can be potentially damaged.       4. Bleeding: Bleeding is more common if the patient is taking blood thinners such as  aspirin, Coumadin, Ticiid, Plavix, etc., or if he/she have some genetic predisposition  such as hemophilia. Bleeding into the spinal canal can cause compression of the spinal  cord with subsequent paralysis.  This would require an emergency surgery to  decompress and there are no guarantees that the patient would recover from the  paralysis.       5. Pneumothorax:  Puncturing of a lung is a possibility, every time a needle is introduced in  the area of the chest or upper back.  Pneumothorax refers to free air around the  collapsed lung(s), inside of the thoracic cavity (chest cavity).  Another two possible  complications related to a similar event would include: Hemothorax and Chylothorax.   These are variations of the Pneumothorax, where instead of air around the collapsed  lung(s), you may have blood or chyle, respectively.       6. Spinal headaches: They may occur with any procedures in the area of the spine.       7. Persistent CSF (Cerebro-Spinal  Fluid) leakage: This is a rare problem, but may occur  with prolonged intrathecal or epidural catheters either due to the formation of a fistulous  track or a dural tear.       8. Nerve damage: By working so close to the spinal cord, there is always a possibility of  nerve damage, which could be as serious as a permanent spinal cord injury with  paralysis.       9. Death:  Although rare, severe deadly allergic reactions known as "Anaphylactic  reaction" can occur to any of the medications used.      10. Worsening of the symptoms:  We can always make thing worse.  What are the chances of something like this happening? Chances of any of this occuring are extremely low.  By statistics, you have more of a chance of getting killed in a motor vehicle accident: while driving to the hospital than any of the above occurring .  Nevertheless, you should be aware that they are possibilities.  In general, it is similar to taking a shower.  Everybody knows that you can slip, hit your head and get killed.  Does that mean that you should not shower again?  Nevertheless always keep in mind that statistics do not mean anything if you happen to be on the wrong side of them.  Even if a procedure has a 1 (one) in a 1,000,000 (million) chance of going wrong, it you happen to be that one..Also, keep in mind that by statistics, you have more of a chance of having something go wrong when taking medications.  Who should not have this procedure? If you are on a blood thinning medication (e.g. Coumadin, Plavix, see list of "Blood Thinners"), or if you have an active infection going on, you should not have the procedure.  If you are taking any blood thinners, please inform your physician.  How should I prepare for this procedure?  Do not eat or drink anything at least six hours prior to the procedure.  Bring a driver with you .  It cannot be a taxi.  Come accompanied by an adult that can drive you back, and that is strong  enough to help you if your legs get weak or numb from the local anesthetic.  Take all of your medicines the morning of the procedure with just enough water to swallow them.  If you have diabetes, make sure that you are scheduled to have your procedure done first thing in the morning, whenever possible.  If you have diabetes, take only half of your insulin dose and notify our nurse that you have done so as soon  as you arrive at the clinic.  If you are diabetic, but only take blood sugar pills (oral hypoglycemic), then do not take them on the morning of your procedure.  You may take them after you have had the procedure.  Do not take aspirin or any aspirin-containing medications, at least eleven (11) days prior to the procedure.  They may prolong bleeding.  Wear loose fitting clothing that may be easy to take off and that you would not mind if it got stained with Betadine or blood.  Do not wear any jewelry or perfume  Remove any nail coloring.  It will interfere with some of our monitoring equipment.  NOTE: Remember that this is not meant to be interpreted as a complete list of all possible complications.  Unforeseen problems may occur.  BLOOD THINNERS The following drugs contain aspirin or other products, which can cause increased bleeding during surgery and should not be taken for 2 weeks prior to and 1 week after surgery.  If you should need take something for relief of minor pain, you may take acetaminophen which is found in Tylenol,m Datril, Anacin-3 and Panadol. It is not blood thinner. The products listed below are.  Do not take any of the products listed below in addition to any listed on your instruction sheet.  A.P.C or A.P.C with Codeine Codeine Phosphate Capsules #3 Ibuprofen Ridaura  ABC compound Congesprin Imuran rimadil  Advil Cope Indocin Robaxisal  Alka-Seltzer Effervescent Pain Reliever and Antacid Coricidin or Coricidin-D  Indomethacin Rufen  Alka-Seltzer plus Cold  Medicine Cosprin Ketoprofen S-A-C Tablets  Anacin Analgesic Tablets or Capsules Coumadin Korlgesic Salflex  Anacin Extra Strength Analgesic tablets or capsules CP-2 Tablets Lanoril Salicylate  Anaprox Cuprimine Capsules Levenox Salocol  Anexsia-D Dalteparin Magan Salsalate  Anodynos Darvon compound Magnesium Salicylate Sine-off  Ansaid Dasin Capsules Magsal Sodium Salicylate  Anturane Depen Capsules Marnal Soma  APF Arthritis pain formula Dewitt's Pills Measurin Stanback  Argesic Dia-Gesic Meclofenamic Sulfinpyrazone  Arthritis Bayer Timed Release Aspirin Diclofenac Meclomen Sulindac  Arthritis pain formula Anacin Dicumarol Medipren Supac  Analgesic (Safety coated) Arthralgen Diffunasal Mefanamic Suprofen  Arthritis Strength Bufferin Dihydrocodeine Mepro Compound Suprol  Arthropan liquid Dopirydamole Methcarbomol with Aspirin Synalgos  ASA tablets/Enseals Disalcid Micrainin Tagament  Ascriptin Doan's Midol Talwin  Ascriptin A/D Dolene Mobidin Tanderil  Ascriptin Extra Strength Dolobid Moblgesic Ticlid  Ascriptin with Codeine Doloprin or Doloprin with Codeine Momentum Tolectin  Asperbuf Duoprin Mono-gesic Trendar  Aspergum Duradyne Motrin or Motrin IB Triminicin  Aspirin plain, buffered or enteric coated Durasal Myochrisine Trigesic  Aspirin Suppositories Easprin Nalfon Trillsate  Aspirin with Codeine Ecotrin Regular or Extra Strength Naprosyn Uracel  Atromid-S Efficin Naproxen Ursinus  Auranofin Capsules Elmiron Neocylate Vanquish  Axotal Emagrin Norgesic Verin  Azathioprine Empirin or Empirin with Codeine Normiflo Vitamin E  Azolid Emprazil Nuprin Voltaren  Bayer Aspirin plain, buffered or children's or timed BC Tablets or powders Encaprin Orgaran Warfarin Sodium  Buff-a-Comp Enoxaparin Orudis Zorpin  Buff-a-Comp with Codeine Equegesic Os-Cal-Gesic   Buffaprin Excedrin plain, buffered or Extra Strength Oxalid   Bufferin Arthritis Strength Feldene Oxphenbutazone   Bufferin plain or  Extra Strength Feldene Capsules Oxycodone with Aspirin   Bufferin with Codeine Fenoprofen Fenoprofen Pabalate or Pabalate-SF   Buffets II Flogesic Panagesic   Buffinol plain or Extra Strength Florinal or Florinal with Codeine Panwarfarin   Buf-Tabs Flurbiprofen Penicillamine   Butalbital Compound Four-way cold tablets Penicillin   Butazolidin Fragmin Pepto-Bismol   Carbenicillin Geminisyn Percodan   Carna Arthritis Reliever Geopen Persantine  Carprofen Gold's salt Persistin   Chloramphenicol Goody's Phenylbutazone   Chloromycetin Haltrain Piroxlcam   Clmetidine heparin Plaquenil   Cllnoril Hyco-pap Ponstel   Clofibrate Hydroxy chloroquine Propoxyphen         Before stopping any of these medications, be sure to consult the physician who ordered them.  Some, such as Coumadin (Warfarin) are ordered to prevent or treat serious conditions such as "deep thrombosis", "pumonary embolisms", and other heart problems.  The amount of time that you may need off of the medication may also vary with the medication and the reason for which you were taking it.  If you are taking any of these medications, please make sure you notify your pain physician before you undergo any procedures.          Radiofrequency Lesioning Radiofrequency lesioning is a procedure that is performed to relieve pain. The procedure is often used for back, neck, or arm pain. Radiofrequency lesioning involves the use of a machine that creates radio waves to make heat. During the procedure, the heat is applied to the nerve that carries the pain signal. The heat damages the nerve and interferes with the pain signal. Pain relief usually starts about 2 weeks after the procedure and lasts for 6 months to 1 year. You will be awake during the procedure. You will need to be able to talk with the health care provider during the procedure. Tell a health care provider about:  Any allergies you have.  All medicines you are taking,  including vitamins, herbs, eye drops, creams, and over-the-counter medicines.  Any problems you or family members have had with anesthetic medicines.  Any blood disorders you have.  Any surgeries you have had.  Any medical conditions you have or have had.  Whether you are pregnant or may be pregnant. What are the risks? Generally, this is a safe procedure. However, problems may occur, including:  Pain or soreness at the injection site.  Allergic reaction to medicines given during the procedure.  Bleeding.  Infection at the injection site.  Damage to nerves or blood vessels. What happens before the procedure? Staying hydrated Follow instructions from your health care provider about hydration, which may include:  Up to 2 hours before the procedure - you may continue to drink clear liquids, such as water, clear fruit juice, black coffee, and plain tea. Eating and drinking Follow instructions from your health care provider about eating and drinking, which may include:  8 hours before the procedure - stop eating heavy meals or foods, such as meat, fried foods, or fatty foods.  6 hours before the procedure - stop eating light meals or foods, such as toast or cereal.  6 hours before the procedure - stop drinking milk or drinks that contain milk.  2 hours before the procedure - stop drinking clear liquids. Medicines Ask your health care provider about:  Changing or stopping your regular medicines. This is especially important if you are taking diabetes medicines or blood thinners.  Taking medicines such as aspirin and ibuprofen. These medicines can thin your blood. Do not take these medicines unless your health care provider tells you to take them.  Taking over-the-counter medicines, vitamins, herbs, and supplements. General instructions  Plan to have someone take you home from the hospital or clinic.  If you will be going home right after the procedure, plan to have someone  with you for 24 hours.  Ask your health care provider what steps will be taken to help prevent  infection. These may include: ? Removing hair at the procedure site. ? Washing skin with a germ-killing soap. ? Taking antibiotic medicine. What happens during the procedure?  An IV will be inserted into one of your veins.  You will be given one or more of the following: ? A medicine to help you relax (sedative). ? A medicine to numb the area (local anesthetic).  Your health care provider will insert a radiofrequency needle into the area to be treated. This is done with the help of a type of X-ray (fluoroscopy).  A wire that carries the radio waves (electrode) will be put through the radiofrequency needle.  An electrical pulse will be sent through the electrode to verify the correct nerve that is causing your pain. You will feel a tingling sensation, and you may have muscle twitching.  The tissue around the needle tip will be heated by an electric current that comes from the radiofrequency machine. This will numb the nerves.  The needle will be removed.  A bandage (dressing) will be put on the insertion area. The procedure may vary among health care providers and hospitals.   What happens after the procedure?  Your blood pressure, heart rate, breathing rate, and blood oxygen level will be monitored until you leave the hospital or clinic.  Return to your normal activities as told by your health care provider. Ask your health care provider what activities are safe for you.  Do not drive for 24 hours if you were given a sedative during your procedure. Summary  Radiofrequency lesioning is a procedure that is performed to relieve pain. The procedure is often used for back, neck, or arm pain.  Radiofrequency lesioning involves the use of a machine that creates radio waves to make heat.  Plan to have someone take you home from the hospital or clinic. Do not drive for 24 hours if you were  given a sedative during your procedure.  Return to your normal activities as told by your health care provider. Ask your health care provider what activities are safe for you. This information is not intended to replace advice given to you by your health care provider. Make sure you discuss any questions you have with your health care provider. Document Revised: 12/14/2017 Document Reviewed: 12/14/2017 Elsevier Patient Education  [place].

## 2020-08-26 ENCOUNTER — Ambulatory Visit: Payer: Medicare HMO | Admitting: Student in an Organized Health Care Education/Training Program

## 2020-08-26 ENCOUNTER — Ambulatory Visit
Admission: RE | Admit: 2020-08-26 | Discharge: 2020-08-26 | Disposition: A | Discharge status: Home / Self Care | Payer: Medicare HMO | Source: Ambulatory Visit | Attending: Student in an Organized Health Care Education/Training Program | Admitting: Student in an Organized Health Care Education/Training Program

## 2020-08-26 ENCOUNTER — Encounter: Payer: Self-pay | Admitting: Student in an Organized Health Care Education/Training Program

## 2020-08-26 ENCOUNTER — Other Ambulatory Visit: Payer: Self-pay

## 2020-08-26 DIAGNOSIS — M1712 Unilateral primary osteoarthritis, left knee: Secondary | ICD-10-CM | POA: Diagnosis not present

## 2020-08-26 DIAGNOSIS — G8929 Other chronic pain: Secondary | ICD-10-CM | POA: Diagnosis not present

## 2020-08-26 DIAGNOSIS — M170 Bilateral primary osteoarthritis of knee: Secondary | ICD-10-CM

## 2020-08-26 DIAGNOSIS — G894 Chronic pain syndrome: Secondary | ICD-10-CM | POA: Insufficient documentation

## 2020-08-26 DIAGNOSIS — M5416 Radiculopathy, lumbar region: Secondary | ICD-10-CM | POA: Insufficient documentation

## 2020-08-26 MED ORDER — FENTANYL CITRATE (PF) 100 MCG/2ML IJ SOLN
25.0000 ug | INTRAMUSCULAR | Status: DC | PRN
  Administered 2020-08-26: 100 ug via INTRAVENOUS
  Filled 2020-08-26: qty 2

## 2020-08-26 MED ORDER — LIDOCAINE HCL 2 % IJ SOLN
20.0000 mL | Freq: Once | INTRAMUSCULAR | Status: AC
  Administered 2020-08-26: 400 mg
  Filled 2020-08-26: qty 20

## 2020-08-26 MED ORDER — ROPIVACAINE HCL 2 MG/ML IJ SOLN
9.0000 mL | Freq: Once | INTRAMUSCULAR | Status: AC
  Administered 2020-08-26: 10 mL via PERINEURAL
  Filled 2020-08-26: qty 10

## 2020-08-26 MED ORDER — DEXAMETHASONE SODIUM PHOSPHATE 10 MG/ML IJ SOLN
10.0000 mg | Freq: Once | INTRAMUSCULAR | Status: AC
  Administered 2020-08-26: 10 mg
  Filled 2020-08-26: qty 1

## 2020-08-26 NOTE — Progress Notes (Signed)
Safety precautions to be maintained throughout the outpatient stay will include: orient to surroundings, keep bed in low position, maintain call bell within reach at all times, provide assistance with transfer out of bed and ambulation.  

## 2020-08-26 NOTE — Progress Notes (Signed)
PROVIDER NOTE: Information contained herein reflects review and annotations entered in association with encounter. Interpretation of such information and data should be left to medically-trained personnel. Information provided to patient can be located elsewhere in the medical record under "Patient Instructions". Document created using STT-dictation technology, any transcriptional errors that may result from process are unintentional.    Patient: Pamela Romero  Service Category: Procedure  Provider: Gillis Santa, MD  DOB: 01-04-1951  DOS: 08/26/2020  Location: [facility]  MRN: [id number]  Setting: Ambulatory - outpatient  Referring Provider: Gillis Santa, MD  Type: Established Patient  Specialty: Interventional Pain Management  PCP: [name], FNP   Primary Reason for Visit: Interventional Pain Management Treatment. CC: Leg Pain (left)  Procedure:          Anesthesia, Analgesia, Anxiolysis:  Type: Therapeutic Superolateral, Superomedial, and Inferomedial, Genicular Nerve Radiofrequency Ablation (destruction).   #1  Region: Lateral, Anterior, and Medial aspects of the knee joint, above and below the knee joint proper. Level: Superior and inferior to the knee joint. Laterality: Left  Type: Moderate (Conscious) Sedation combined with Local Anesthesia Indication(s): Analgesia and Anxiety Route: Intravenous (IV) IV Access: Secured Sedation: Meaningful verbal contact was maintained at all times during the procedure  Local Anesthetic: Lidocaine 1-2%  Position: Supine   Indications: 1. Chronic radicular lumbar pain (left)   2. Primary osteoarthritis of left knee   3. Bilateral primary osteoarthritis of knee   4. Chronic pain syndrome    Pamela Romero has been dealing with the above chronic pain for longer than three months and has either failed to respond, was unable to tolerate, or simply did not get enough benefit from other more conservative therapies including, but  not limited to: 1. Over-the-counter medications 2. Anti-inflammatory medications 3. Muscle relaxants 4. Membrane stabilizers 5. Opioids 6. Physical therapy and/or chiropractic manipulation 7. Modalities (Heat, ice, etc.) 8. Invasive techniques such as nerve blocks.  Pamela Romero has attained more than 50% relief of the pain from a series of diagnostic injections conducted in separate occasions.  Pain Score: Pre-procedure: 7 /10 Post-procedure: 0-No pain/10  Pre-op H&P Assessment:  Pamela Romero is a 70 y.o. (year old), female patient, seen today for interventional treatment. She  has a past surgical history that includes Thyroidectomy (2005); Cardiac catheterization (2006); Total abdominal hysterectomy; Breast surgery (Right, March 2014); Breast cyst aspiration (Right, 2014); and Esophagogastroduodenoscopy (N/A, 04/07/2020). Pamela Romero has a current medication list which includes the following prescription(s): albuterol, esomeprazole, gabapentin, hydrocodone-acetaminophen, [START ON 09/14/2020] hydrocodone-acetaminophen, [START ON 10/14/2020] hydrocodone-acetaminophen, levothyroxine, montelukast, omeprazole, alendronate, and linaclotide, and the following Facility-Administered Medications: fentanyl. Her primarily concern today is the Leg Pain (left)  Initial Vital Signs:  Pulse/HCG Rate: 97ECG Heart Rate: 88 Temp: (!) 96.7 F (35.9 C) Resp: 16 BP: 124/86 SpO2: 100 %  BMI: Estimated body mass index is 30.12 kg/m as calculated from the following:   Height as of this encounter: 5' 8.5" (1.74 m).   Weight as of this encounter: 201 lb (91.2 kg).  Risk Assessment: Allergies: Reviewed. She has No Known Allergies.  Allergy Precautions: None required Coagulopathies: Reviewed. None identified.  Blood-thinner therapy: None at this time Active Infection(s): Reviewed. None identified. Pamela Romero is afebrile  Site Confirmation: Pamela Romero was asked to confirm the procedure and laterality  before marking the site Procedure checklist: Completed Consent: Before the procedure and under the influence of no sedative(s), amnesic(s), or anxiolytics, the patient was informed of the treatment options, risks and possible  complications. To fulfill our ethical and legal obligations, as recommended by the American Medical Association's Code of Ethics, I have informed the patient of my clinical impression; the nature and purpose of the treatment or procedure; the risks, benefits, and possible complications of the intervention; the alternatives, including doing nothing; the risk(s) and benefit(s) of the alternative treatment(s) or procedure(s); and the risk(s) and benefit(s) of doing nothing. The patient was provided information about the general risks and possible complications associated with the procedure. These may include, but are not limited to: failure to achieve desired goals, infection, bleeding, organ or nerve damage, allergic reactions, paralysis, and death. In addition, the patient was informed of those risks and complications associated to the procedure, such as failure to decrease pain; infection; bleeding; organ or nerve damage with subsequent damage to sensory, motor, and/or autonomic systems, resulting in permanent pain, numbness, and/or weakness of one or several areas of the body; allergic reactions; (i.e.: anaphylactic reaction); and/or death. Furthermore, the patient was informed of those risks and complications associated with the medications. These include, but are not limited to: allergic reactions (i.e.: anaphylactic or anaphylactoid reaction(s)); adrenal axis suppression; blood sugar elevation that in diabetics may result in ketoacidosis or comma; water retention that in patients with history of congestive heart failure may result in shortness of breath, pulmonary edema, and decompensation with resultant heart failure; weight gain; swelling or edema; medication-induced neural toxicity;  particulate matter embolism and blood vessel occlusion with resultant organ, and/or nervous system infarction; and/or aseptic necrosis of one or more joints. Finally, the patient was informed that Medicine is not an exact science; therefore, there is also the possibility of unforeseen or unpredictable risks and/or possible complications that may result in a catastrophic outcome. The patient indicated having understood very clearly. We have given the patient no guarantees and we have made no promises. Enough time was given to the patient to ask questions, all of which were answered to the patient's satisfaction. Ms. Kesinger has indicated that she wanted to continue with the procedure. Attestation: I, the ordering provider, attest that I have discussed with the patient the benefits, risks, side-effects, alternatives, likelihood of achieving goals, and potential problems during recovery for the procedure that I have provided informed consent. Date  Time: 08/26/2020  9:49 AM  Pre-Procedure Preparation:  Monitoring: As per clinic protocol. Respiration, ETCO2, SpO2, BP, heart rate and rhythm monitor placed and checked for adequate function Safety Precautions: Patient was assessed for positional comfort and pressure points before starting the procedure. Time-out: I initiated and conducted the "Time-out" before starting the procedure, as per protocol. The patient was asked to participate by confirming the accuracy of the "Time Out" information. Verification of the correct person, site, and procedure were performed and confirmed by me, the nursing staff, and the patient. "Time-out" conducted as per Joint Commission's Universal Protocol (UP.01.01.01). Time: 1030  Description of Procedure:          Target Area: For Genicular Nerve radiofrequency ablation (destruction), the targets are: the superolateral genicular nerve, located in the lateral distal portion of the femoral shaft as it curves to form the lateral  epicondyle, in the region of the distal femoral metaphysis; the superomedial genicular nerve, located in the medial distal portion of the femoral shaft as it curves to form the medial epicondyle; and the inferomedial genicular nerve, located in the medial, proximal portion of the tibial shaft, as it curves to form the medial epicondyle, in the region of the proximal tibial metaphysis. Approach:  Anterior, ipsilateral approach. Area Prepped: Entire knee area, from mid-thigh to mid-shin, lateral, anterior, and medial aspects. DuraPrep (Iodine Povacrylex [0.7% available iodine] and Isopropyl Alcohol, 74% w/w) Safety Precautions: Aspiration looking for blood return was conducted prior to all injections. At no point did we inject any substances, as a needle was being advanced. No attempts were made at seeking any paresthesias. Safe injection practices and needle disposal techniques used. Medications properly checked for expiration dates. SDV (single dose vial) medications used. Description of the Procedure: Protocol guidelines were followed. The patient was placed in position over the procedure table. The target area was identified and the area prepped in the usual manner. The skin and muscle were infiltrated with local anesthetic. Appropriate amount of time allowed to pass for local anesthetics to take effect. Radiofrequency needles were introduced to the target area using fluoroscopic guidance. Using the NeuroTherm NT1100 Radiofrequency Generator, sensory stimulation using 50 Hz was used to locate & identify the nerve, making sure that the needle was positioned such that there was no sensory stimulation below 0.3 V or above 0.7 V. Stimulation using 2 Hz was used to evaluate the motor component. Care was taken not to lesion any nerves that demonstrated motor stimulation of the lower extremities at an output of less than 2.5 times that of the sensory threshold, or a maximum of 2.0 V. Once satisfactory placement of  the needles was achieved, the numbing solution was slowly injected after negative aspiration. After waiting for at least 2 minutes, the ablation was performed at 80 degrees C for 60 seconds, using regular Radiofrequency settings. Once the procedure was completed, the needles were then removed and the area cleansed, making sure to leave some of the prepping solution back to take advantage of its long term bactericidal properties. Intra-operative Compliance: Compliant  Vitals:   08/26/20 1047 08/26/20 1057 08/26/20 1107 08/26/20 1115  BP: 125/87 (!) 147/96 (!) 124/91 126/88  Pulse:  80 78 88  Resp: 20 16 16 16   Temp:      TempSrc:      SpO2: 100% 100% 98% 99%  Weight:      Height:        Start Time: 1030 hrs. End Time: 1047 hrs. Materials & Medications:  Needle(s) Type: Teflon-coated, curved tip, Radiofrequency needle(s) Gauge: 22G Length: 10cm Medication(s): Please see orders for medications and dosing details.  6 cc solution made of 5 cc of 0.2% Ropivacaine and 1 cc of Decadron 10 mg/cc 2 cc injected at each level for the left knee after sensorimotor testing prior to lesioning.  Imaging Guidance (Non-Spinal):          Type of Imaging Technique: Fluoroscopy Guidance (Non-Spinal) Indication(s): Assistance in needle guidance and placement for procedures requiring needle placement in or near specific anatomical locations not easily accessible without such assistance. Exposure Time: Please see nurses notes. Contrast: Before injecting any contrast, we confirmed that the patient did not have an allergy to iodine, shellfish, or radiological contrast. Once satisfactory needle placement was completed at the desired level, radiological contrast was injected. Contrast injected under live fluoroscopy. No contrast complications. See chart for type and volume of contrast used. Fluoroscopic Guidance: I was personally present during the use of fluoroscopy. "Tunnel Vision Technique" used to obtain the  best possible view of the target area. Parallax error corrected before commencing the procedure. "Direction-depth-direction" technique used to introduce the needle under continuous pulsed fluoroscopy. Once target was reached, antero-posterior, oblique, and lateral fluoroscopic projection used confirm needle placement  in all planes. Images permanently stored in EMR. Interpretation: I personally interpreted the imaging intraoperatively. Adequate needle placement confirmed in multiple planes. Appropriate spread of contrast into desired area was observed. No evidence of afferent or efferent intravascular uptake. Permanent images saved into the patient's record.   Post-operative Assessment:  Post-procedure Vital Signs:  Pulse/HCG Rate: 88 (nsr)85 Temp: (!) 96.7 F (35.9 C) Resp: 16 BP: 126/88 SpO2: 99 %  EBL: None  Complications: No immediate post-treatment complications observed by team, or reported by patient.  Note: The patient tolerated the entire procedure well. A repeat set of vitals were taken after the procedure and the patient was kept under observation following institutional policy, for this type of procedure. Post-procedural neurological assessment was performed, showing return to baseline, prior to discharge. The patient was provided with post-procedure discharge instructions, including a section on how to identify potential problems. Should any problems arise concerning this procedure, the patient was given instructions to immediately contact us, at any time, without hesitation. In any case, we plan to contact the patient by telephone for a follow-up status report regarding this interventional procedure.  Comments:  No additional relevant information.  Plan of Care  Orders:  Orders Placed This Encounter  Procedures  . [facility] C-ARM 1-60 MIN NO REPORT    Intraoperative interpretation by procedural physician at [facility].    Standing Status:   Standing    Number  of Occurrences:   1    Order Specific Question:   Reason for exam:    Answer:   Assistance in needle guidance and placement for procedures requiring needle placement in or near specific anatomical locations not easily accessible without such assistance.   Chronic Opioid Analgesic:  Hydrocodone 7.5 mg 3 times daily as needed with an extra quantity 15/month (#105)For severe breakthrough pain    Medications ordered for procedure: Meds ordered this encounter  Medications  . lidocaine (XYLOCAINE) 2 % (with pres) injection 400 mg  . fentaNYL (SUBLIMAZE) injection 25-50 mcg    Make sure Narcan is available in the pyxis when using this medication. In the event of respiratory depression (RR< 8/min): Titrate NARCAN (naloxone) in increments of 0.1 to 0.2 mg IV at 2-3 minute intervals, until desired degree of reversal.  . ropivacaine (PF) 2 mg/mL (0.2%) (NAROPIN) injection 9 mL  . dexamethasone (DECADRON) injection 10 mg   Medications administered: We administered lidocaine, fentaNYL, ropivacaine (PF) 2 mg/mL (0.2%), and dexamethasone.  See the medical record for exact dosing, route, and time of administration.  Follow-up plan:   Return in about 4 weeks (around 09/23/2020) for Post Procedure Evaluation, virtual.      Status post left genicular RFA 04/16/2018, status post right genicular RFA 03/12/2018.  Provided her with benefit.  Status post left knee intra-articular steroid injection #3 on 11/21/2018.   hyalgan left knee #1 on 04/17/19, #2 05/22/19, #3 07/03/2019, #4 08/28/19, #5 on 09/25/19           Recent Visits Date Type Provider Dept  08/04/20 Office Visit Gillis Santa, MD [facility]  Showing recent visits within past 90 days and meeting all other requirements Today's Visits Date Type Provider Dept  08/26/20 Procedure visit Gillis Santa, MD [facility]  Showing today's visits and meeting all other requirements Future Appointments Date Type Provider Dept  09/23/20 Appointment  Gillis Santa, MD [facility]  11/03/20 Appointment Gillis Santa, MD [facility]  Showing future appointments within next 90 days and  meeting all other requirements  Disposition: Discharge home  Discharge (Date  Time): 08/26/2020; 1118 hrs.   Primary Care Physician: [name], FNP Location: [facility] Note by: Gillis Santa, MD Date: 08/26/2020; Time: 12:38 PM  Disclaimer:  Medicine is not an exact science. The only guarantee in medicine is that nothing is guaranteed. It is important to note that the decision to proceed with this intervention was based on the information collected from the patient. The Data and conclusions were drawn from the patient's questionnaire, the interview, and the physical examination. Because the information was provided in large part by the patient, it cannot be guaranteed that it has not been purposely or unconsciously manipulated. Every effort has been made to obtain as much relevant data as possible for this evaluation. It is important to note that the conclusions that lead to this procedure are derived in large part from the available data. Always take into account that the treatment will also be dependent on availability of resources and existing treatment guidelines, considered by other Pain Management Practitioners as being common knowledge and practice, at the time of the intervention. For Medico-Legal purposes, it is also important to point out that variation in procedural techniques and pharmacological choices are the acceptable norm. The indications, contraindications, technique, and results of the above procedure should only be interpreted and judged by a Board-Certified Interventional Pain Specialist with extensive familiarity and expertise in the same exact procedure and technique.

## 2020-08-26 NOTE — Patient Instructions (Signed)

## 2020-08-27 ENCOUNTER — Telehealth: Payer: Self-pay | Admitting: *Deleted

## 2020-08-27 NOTE — Telephone Encounter (Signed)
No problems post procedure. 

## 2020-09-16 ENCOUNTER — Other Ambulatory Visit: Payer: Self-pay

## 2020-09-17 NOTE — Telephone Encounter (Signed)
It looks like she should still be taking this. Can we call her to verify?

## 2020-09-18 MED ORDER — LISINOPRIL-HYDROCHLOROTHIAZIDE 20-12.5 MG PO TABS: 1.0000 | ORAL_TABLET | Freq: Every day | ORAL | 3 refills | Status: DC

## 2020-09-22 ENCOUNTER — Ambulatory Visit: Payer: Medicare HMO | Admitting: Student in an Organized Health Care Education/Training Program

## 2020-09-22 ENCOUNTER — Encounter: Payer: Self-pay | Admitting: Student in an Organized Health Care Education/Training Program

## 2020-09-23 ENCOUNTER — Ambulatory Visit
Payer: Medicare HMO | Attending: Student in an Organized Health Care Education/Training Program | Admitting: Student in an Organized Health Care Education/Training Program

## 2020-09-23 ENCOUNTER — Other Ambulatory Visit: Payer: Self-pay

## 2020-09-23 ENCOUNTER — Ambulatory Visit: Payer: Medicare HMO | Admitting: Student in an Organized Health Care Education/Training Program

## 2020-09-23 ENCOUNTER — Encounter: Payer: Self-pay | Admitting: Student in an Organized Health Care Education/Training Program

## 2020-09-23 DIAGNOSIS — M1712 Unilateral primary osteoarthritis, left knee: Secondary | ICD-10-CM

## 2020-09-23 DIAGNOSIS — M170 Bilateral primary osteoarthritis of knee: Secondary | ICD-10-CM

## 2020-09-23 DIAGNOSIS — G894 Chronic pain syndrome: Secondary | ICD-10-CM

## 2020-09-23 NOTE — Progress Notes (Signed)
Patient: Pamela Romero  Service Category: E/M  Provider: Gillis Santa, MD  DOB: 10-17-1950  DOS: 09/23/2020  Location: Office  MRN: [id number]  Setting: Ambulatory outpatient  Referring Provider: [name], FNP  Type: Established Patient  Specialty: Interventional Pain Management  PCP: [name], FNP  Location: Home  Delivery: TeleHealth     Virtual Encounter - Pain Management PROVIDER NOTE: Information contained herein reflects review and annotations entered in association with encounter. Interpretation of such information and data should be left to medically-trained personnel. Information provided to patient can be located elsewhere in the medical record under "Patient Instructions". Document created using STT-dictation technology, any transcriptional errors that may result from process are unintentional.    Contact & Pharmacy Preferred: [phone] Home: [phone] (home) Mobile: [phone] (mobile) E-mail: No e-mail address on record  [id number] - [place], [place] - [address] [address] Phone: [phone] Fax: [phone]  [facility] [place], [place] [address] [place] [address] Phone: [phone] Fax: [phone]   Pre-screening  Ms. Riedl offered "in-person" vs "virtual" encounter. She indicated preferring virtual for this encounter.   Reason COVID-19*  Social distancing based on CDC and AMA recommendations.   I contacted KRISHAUNA SCHATZMAN on 09/23/2020 via video conference.      I clearly identified myself as Gillis Santa, MD. I verified that I was speaking with the correct person using two identifiers (Name: SOLSTICE LASTINGER, and date of birth: 1951/04/03).  Consent I sought verbal advanced consent from [name] for virtual visit interactions. I informed Ms. Barasch of possible security and privacy concerns, risks, and limitations associated with providing  "not-in-person" medical evaluation and management services. I also informed Ms. Eugenio of the availability of "in-person" appointments. Finally, I informed her that there would be a charge for the virtual visit and that she could be  personally, fully or partially, financially responsible for it. Ms. Croker expressed understanding and agreed to proceed.   Historic Elements   Ms. Marigene PHIL CORTI is a 70 y.o. year old, female patient evaluated today after our last contact on 08/26/2020. Ms. Decaire  has a past medical history of Abnormal EKG, CAD (coronary artery disease), Cancer ([place]), COPD (chronic obstructive pulmonary disease) ([place]), Endometrial adenocarcinoma ([place]) (2011), Hyperlipidemia, Hypertension, Osteoarthritis, Reflux gastritis, Thyroid disease, tobacco abuse, and Vaginal delivery. She also  has a past surgical history that includes Thyroidectomy (2005); Cardiac catheterization (2006); Total abdominal hysterectomy; Breast surgery (Right, March 2014); Breast cyst aspiration (Right, 2014); and Esophagogastroduodenoscopy (N/A, 04/07/2020). Ms. Santee has a current medication list which includes the following prescription(s): albuterol, esomeprazole, gabapentin, hydrocodone-acetaminophen, [START ON 10/14/2020] hydrocodone-acetaminophen, levothyroxine, lisinopril-hydrochlorothiazide, montelukast, omeprazole, alendronate, and linaclotide. She  reports that she quit smoking about 2 years ago. Her smoking use included cigarettes. She has a 25.00 pack-year smoking history. She has quit using smokeless tobacco. She reports that she does not drink alcohol and does not use drugs. Ms. Swaby has No Known Allergies.   HPI  Today, she is being contacted for a post-procedure assessment.  Post-Procedure Evaluation  Procedure (08/26/2020):   Type: Therapeutic Superolateral, Superomedial, and Inferomedial, Genicular Nerve Radiofrequency Ablation (destruction).   #1  Region: Lateral, Anterior, and Medial  aspects of the knee joint, above and below the knee joint proper. Level: Superior and inferior to the knee joint. Laterality: Left  Sedation: Please see nurses note.  Effectiveness during initial  hour after procedure(Ultra-Short Term Relief): 100 %   Local anesthetic used: Long-acting (4-6 hours) Effectiveness: Defined as any analgesic benefit obtained secondary to the administration of local anesthetics. This carries significant diagnostic value as to the etiological location, or anatomical origin, of the pain. Duration of benefit is expected to coincide with the duration of the local anesthetic used.  Effectiveness during initial 4-6 hours after procedure(Short-Term Relief): 100 %   Long-term benefit: Defined as any relief past the pharmacologic duration of the local anesthetics.  Effectiveness past the initial 6 hours after procedure(Long-Term Relief): 0 %   Current benefits: Defined as benefit that persist at this time.   Analgesia:  No benefit  Pharmacotherapy Assessment  Analgesic: Hydrocodone 7.5 mg 3 times daily as needed with an extra quantity 15/month (#105)For severe breakthrough pain    Monitoring: [place] PMP: PDMP reviewed during this encounter.       Pharmacotherapy: No side-effects or adverse reactions reported. Compliance: No problems identified. Effectiveness: Clinically acceptable. Plan: Refer to "POC".  UDS:  Summary  Date Value Ref Range Status  09/03/2019 Note  Final    Comment:    ==================================================================== [employer] Select 13 (MW) ==================================================================== Test                             Result       Flag       Units Drug Present and Declared for Prescription Verification   Hydrocodone                    >5263        EXPECTED   ng/mg creat   Hydromorphone                  309          EXPECTED   ng/mg creat   Dihydrocodeine                 987          EXPECTED   ng/mg  creat   Norhydrocodone                 >2632        EXPECTED   ng/mg creat    Sources of hydrocodone include scheduled prescription medications.    Hydromorphone, dihydrocodeine and norhydrocodone are expected    metabolites of hydrocodone. Hydromorphone and dihydrocodeine are    also available as scheduled prescription medications. ==================================================================== Test                      Result    Flag   Units      Ref Range   Creatinine              190              mg/dL      >=20 ==================================================================== Declared Medications:  The flagging and interpretation on this report are based on the  following declared medications.  Unexpected results may arise from  inaccuracies in the declared medications.  **Note: The testing scope of this panel includes these medications:  Hydrocodone (Norco)  **Note: The testing scope of this panel does not include the  following reported medications:  Acetaminophen (Norco)  Albuterol (Proair HFA)  Baclofen (Lioresal)  Budesonide  Docusate (Colace)  Esomeprazole (Nexium)  Formoterol  Glycopyrrolate  Levothyroxine (Synthroid)  Lisinopril (Zestril)  Montelukast (Singulair)  Ondansetron (Zofran)  Polyethylene  Glycol (MiraLAX)  Sumatriptan (Imitrex)  Verapamil ==================================================================== For clinical consultation, please call [phone]. ====================================================================     Laboratory Chemistry Profile   Renal Lab Results  Component Value Date   BUN 15 04/07/2020   CREATININE 0.82 04/07/2020   BCR 22 01/02/2020   GFR 60.34 01/21/2015   GFRAA 61 01/02/2020   GFRNONAA >60 04/07/2020     Hepatic Lab Results  Component Value Date   AST 14 01/02/2020   ALT 7 01/02/2020   ALBUMIN 4.4 08/29/2017   ALKPHOS 126 (H) 08/29/2017   HCVAB NEGATIVE 01/21/2015     Electrolytes Lab  Results  Component Value Date   NA 140 04/07/2020   K 3.9 04/07/2020   CL 104 04/07/2020   CALCIUM 9.8 04/07/2020   MG 1.9 06/04/2018     Bone Lab Results  Component Value Date   VD25OH 26.66 (L) 05/14/2014     Inflammation (CRP: Acute Phase) (ESR: Chronic Phase) Lab Results  Component Value Date   ESRSEDRATE 19 03/30/2012       Note: Above Lab results reviewed.   Assessment  The primary encounter diagnosis was Primary osteoarthritis of left knee. Diagnoses of Bilateral primary osteoarthritis of knee and Chronic pain syndrome were also pertinent to this visit.  Plan of Care  -Not as much pain relief as we had hoped after left genicular nerve RFA.  Patient states that she experiences greater pain relief with her left genicular nerve steroid block versus RFA.  We can repeat genicular nerve blocks every 3 to 4 months. -Patient wants to avoid left knee replacement as much as possible -Continue with medication management -Keep scheduled appointment  Follow-up plan:   Return for Keep sch. appt.     Status post left genicular RFA 04/16/2018, status post right genicular RFA 03/12/2018.  Provided her with benefit.  Status post left knee intra-articular steroid injection #3 on 11/21/2018.   hyalgan left knee #1 on 04/17/19, #2 05/22/19, #3 07/03/2019, #4 08/28/19, #5 on 09/25/19            Recent Visits Date Type Provider Dept  08/26/20 Procedure visit Gillis Santa, MD [facility]  08/04/20 Office Visit Gillis Santa, MD [facility]  Showing recent visits within past 90 days and meeting all other requirements Today's Visits Date Type Provider Dept  09/23/20 Telemedicine Gillis Santa, MD [facility]  Showing today's visits and meeting all other requirements Future Appointments Date Type Provider Dept  11/03/20 Appointment Gillis Santa, MD [facility]  Showing future appointments within next 90 days and meeting all other requirements I discussed  the assessment and treatment plan with the patient. The patient was provided an opportunity to ask questions and all were answered. The patient agreed with the plan and demonstrated an understanding of the instructions.  Patient advised to call back or seek an in-person evaluation if the symptoms or condition worsens.  Duration of encounter: 20 minutes.  Note by: Gillis Santa, MD Date: 09/23/2020; Time: 1:58 PM

## 2020-09-29 ENCOUNTER — Telehealth: Payer: Self-pay

## 2020-09-29 ENCOUNTER — Ambulatory Visit: Payer: Medicare HMO

## 2020-09-29 NOTE — Telephone Encounter (Signed)
This nurse attempted to call patient three times in regards to scheduled telephonic AWV. Called at 1015, 1020, and 1030. Phone just rang with no voicemail.

## 2020-10-14 ENCOUNTER — Other Ambulatory Visit: Payer: Self-pay | Admitting: Student in an Organized Health Care Education/Training Program

## 2020-10-14 ENCOUNTER — Telehealth: Payer: Self-pay | Admitting: *Deleted

## 2020-10-14 ENCOUNTER — Telehealth: Payer: Self-pay

## 2020-10-14 DIAGNOSIS — G894 Chronic pain syndrome: Secondary | ICD-10-CM

## 2020-10-14 DIAGNOSIS — M5416 Radiculopathy, lumbar region: Secondary | ICD-10-CM

## 2020-10-14 MED ORDER — HYDROCODONE-ACETAMINOPHEN 7.5-325 MG PO TABS: 1.0000 | ORAL_TABLET | Freq: Four times a day (QID) | ORAL | 0 refills | Status: DC | PRN

## 2020-10-14 NOTE — Progress Notes (Unsigned)
Requested Prescriptions   Signed Prescriptions Disp Refills   HYDROcodone-acetaminophen (NORCO) 7.5-325 MG tablet 105 tablet 0    Sig: Take 1 tablet by mouth every 6 (six) hours as needed for severe pain. Must last 30 days.

## 2020-10-14 NOTE — Telephone Encounter (Signed)
Pt wants to know if you can send the prescription of Hydrocodone sent to the [facility] on [address], [place], [place] [address], [facility]ist [facility] out and they do not know when they will have pills back in.

## 2020-10-14 NOTE — Progress Notes (Signed)
Patient called and informed

## 2020-10-14 NOTE — Telephone Encounter (Signed)
Dr. Holley Raring messaged about this. Waiting on his response.

## 2020-10-14 NOTE — Telephone Encounter (Signed)
Called patient and her husband answered the phone. He states wife is at work and he is  the one calling . He has not call [facility] to see if they have the medications. Instructed him to and call us back before we ask Dr Holley Raring

## 2020-11-03 ENCOUNTER — Ambulatory Visit
Payer: Medicare HMO | Attending: Student in an Organized Health Care Education/Training Program | Admitting: Student in an Organized Health Care Education/Training Program

## 2020-11-03 ENCOUNTER — Ambulatory Visit: Payer: Medicare HMO

## 2020-11-03 ENCOUNTER — Encounter: Payer: Self-pay | Admitting: Student in an Organized Health Care Education/Training Program

## 2020-11-03 ENCOUNTER — Other Ambulatory Visit: Payer: Self-pay

## 2020-11-03 VITALS — BP 108/74 | HR 98 | Temp 97.1°F | Resp 16 | Ht 68.0 in | Wt 200.0 lb

## 2020-11-03 DIAGNOSIS — M170 Bilateral primary osteoarthritis of knee: Secondary | ICD-10-CM | POA: Insufficient documentation

## 2020-11-03 DIAGNOSIS — G894 Chronic pain syndrome: Secondary | ICD-10-CM | POA: Insufficient documentation

## 2020-11-03 DIAGNOSIS — M25571 Pain in right ankle and joints of right foot: Secondary | ICD-10-CM | POA: Diagnosis not present

## 2020-11-03 DIAGNOSIS — M1712 Unilateral primary osteoarthritis, left knee: Secondary | ICD-10-CM | POA: Insufficient documentation

## 2020-11-03 DIAGNOSIS — G8929 Other chronic pain: Secondary | ICD-10-CM | POA: Insufficient documentation

## 2020-11-03 DIAGNOSIS — Z79891 Long term (current) use of opiate analgesic: Secondary | ICD-10-CM | POA: Insufficient documentation

## 2020-11-03 DIAGNOSIS — M5416 Radiculopathy, lumbar region: Secondary | ICD-10-CM | POA: Insufficient documentation

## 2020-11-03 DIAGNOSIS — M722 Plantar fascial fibromatosis: Secondary | ICD-10-CM | POA: Diagnosis not present

## 2020-11-03 MED ORDER — HYDROCODONE-ACETAMINOPHEN 7.5-325 MG PO TABS: 1.0000 | ORAL_TABLET | Freq: Four times a day (QID) | ORAL | 0 refills | Status: AC | PRN

## 2020-11-03 MED ORDER — HYDROCODONE-ACETAMINOPHEN 7.5-325 MG PO TABS: 1.0000 | ORAL_TABLET | Freq: Four times a day (QID) | ORAL | 0 refills | Status: DC | PRN

## 2020-11-03 MED ORDER — GABAPENTIN 100 MG PO CAPS
100.0000 mg | ORAL_CAPSULE | Freq: Every day | ORAL | 5 refills | Status: DC
Start: 2020-11-03 — End: 2021-02-09

## 2020-11-03 NOTE — Patient Instructions (Signed)

## 2020-11-03 NOTE — Progress Notes (Signed)
PROVIDER NOTE: Information contained herein reflects review and annotations entered in association with encounter. Interpretation of such information and data should be left to medically-trained personnel. Information provided to patient can be located elsewhere in the medical record under "Patient Instructions". Document created using STT-dictation technology, any transcriptional errors that may result from process are unintentional.    Patient: Pamela Romero  Service Category: E/M  Provider: Gillis Santa, MD  DOB: 08-13-1950  DOS: 11/03/2020  Specialty: Interventional Pain Management  MRN: [id number]  Setting: Ambulatory outpatient  PCP: [name], FNP  Type: Established Patient    Referring Provider: [name], FNP  Location: Office  Delivery: Face-to-face     HPI  [name], a 70 y.o. year old female, is here today because of her Primary osteoarthritis of left knee [M17.12]. [name] primary complain today is Knee Pain (Left ) Last encounter: My last encounter with her was on 08/26/2020. Pertinent problems: [name] has Major depressive disorder, recurrent, mild ([place]); Chronic pain of left knee; Acute left-sided low back pain without sciatica; Bilateral primary osteoarthritis of knee; Chronic pain of right ankle; Primary osteoarthritis of right ankle; and Osteoporosis on their pertinent problem list. Pain Assessment: Severity of Chronic pain is reported as a 8 /10. Location: Knee Left/down the shin on the left. Onset: More than a month ago. Quality: Discomfort, Constant, Cramping, Numbness. Timing: Constant. Modifying factor(s): medications. Vitals:  height is $RemoveB'5\' 8"'jOoGVAqT$  (1.727 m) and weight is 200 lb (90.7 kg). Her temporal temperature is 97.1 F (36.2 C) (abnormal). Her blood pressure is 108/74 and her pulse is 98. Her respiration is 16 and oxygen saturation is 99%.   Reason for encounter:   Patient presents today for worsening left knee pain related to left knee  osteoarthritis.  Unfortunately she did not experience as much pain relief with her previous left knee RFA as she did with her first 1.  Unfortunately her brother passed away yesterday.  He had cancer.  She has been taking care of him and accompanying him to the hospice house.  She states that she is very fatigued and mentally drained.  She knows that she needs to have her left knee replaced however she wants to avoid that at all costs.  Her husband did not have a good experience with his knee replacement and ended up having worsening knee pain as result of it.  We discussed a long-acting steroid injection for her left knee.  Risks and benefits reviewed and we will try to get that scheduled here soon.  Otherwise I will refill her hydrocodone and gabapentin as prescribed.  We will renew her urine toxicology screen  Pharmacotherapy Assessment  Analgesic: Hydrocodone 7.5 mg 3 times daily as needed with an extra quantity 15/month (#105)For severe breakthrough pain    Monitoring: [place] PMP: PDMP not reviewed this encounter.       Pharmacotherapy: No side-effects or adverse reactions reported. Compliance: No problems identified. Effectiveness: Clinically acceptable.  Janett Billow, RN  11/03/2020  8:54 AM  Sign when Signing Visit Nursing Pain Medication Assessment:  Safety precautions to be maintained throughout the outpatient stay will include: orient to surroundings, keep bed in low position, maintain call bell within reach at all times, provide assistance with transfer out of bed and ambulation.  Medication Inspection Compliance: Pill count conducted under aseptic conditions, in front of the patient. Neither the pills nor the bottle was removed from the patient's sight at any time. Once count was  completed pills were immediately returned to the patient in their original bottle.  Medication: Hydrocodone/APAP Pill/Patch Count:  39 of 105 pills remain Pill/Patch Appearance: Markings consistent with  prescribed medication Bottle Appearance: Standard pharmacy container. Clearly labeled. Filled Date: 07 / 06 / 2022 Last Medication intake:  Yesterday   UDS:  Summary  Date Value Ref Range Status  09/03/2019 Note  Final    Comment:    ==================================================================== [employer] Select 13 (MW) ==================================================================== Test                             Result       Flag       Units Drug Present and Declared for Prescription Verification   Hydrocodone                    >5263        EXPECTED   ng/mg creat   Hydromorphone                  309          EXPECTED   ng/mg creat   Dihydrocodeine                 987          EXPECTED   ng/mg creat   Norhydrocodone                 >2632        EXPECTED   ng/mg creat    Sources of hydrocodone include scheduled prescription medications.    Hydromorphone, dihydrocodeine and norhydrocodone are expected    metabolites of hydrocodone. Hydromorphone and dihydrocodeine are    also available as scheduled prescription medications. ==================================================================== Test                      Result    Flag   Units      Ref Range   Creatinine              190              mg/dL      >=20 ==================================================================== Declared Medications:  The flagging and interpretation on this report are based on the  following declared medications.  Unexpected results may arise from  inaccuracies in the declared medications.  **Note: The testing scope of this panel includes these medications:  Hydrocodone (Norco)  **Note: The testing scope of this panel does not include the  following reported medications:  Acetaminophen (Norco)  Albuterol (Proair HFA)  Baclofen (Lioresal)  Budesonide  Docusate (Colace)  Esomeprazole (Nexium)  Formoterol  Glycopyrrolate  Levothyroxine (Synthroid)  Lisinopril (Zestril)   Montelukast (Singulair)  Ondansetron (Zofran)  Polyethylene Glycol (MiraLAX)  Sumatriptan (Imitrex)  Verapamil ==================================================================== For clinical consultation, please call [phone]. ====================================================================      ROS  Constitutional: Denies any fever or chills Gastrointestinal: No reported hemesis, hematochezia, vomiting, or acute GI distress Musculoskeletal:  Left knee pain Neurological: No reported episodes of acute onset apraxia, aphasia, dysarthria, agnosia, amnesia, paralysis, loss of coordination, or loss of consciousness  Medication Review  HYDROcodone-acetaminophen, albuterol, esomeprazole, gabapentin, levothyroxine, lisinopril-hydrochlorothiazide, and omeprazole  History Review  Allergy: [name] has No Known Allergies. Drug: [name]  reports no history of drug use. Alcohol:  reports no history of alcohol use. Tobacco:  reports that she quit smoking about 2 years ago. Her smoking use included cigarettes. She has  a 25.00 pack-year smoking history. She has quit using smokeless tobacco. Social: [name]  reports that she quit smoking about 2 years ago. Her smoking use included cigarettes. She has a 25.00 pack-year smoking history. She has quit using smokeless tobacco. She reports that she does not drink alcohol and does not use drugs. Medical:  has a past medical history of Abnormal EKG, CAD (coronary artery disease), Cancer ([place]), COPD (chronic obstructive pulmonary disease) ([place]), Endometrial adenocarcinoma ([place]) (2011), Hyperlipidemia, Hypertension, Osteoarthritis, Reflux gastritis, Thyroid disease, tobacco abuse, and Vaginal delivery. Surgical: Ms. Whitehorn  has a past surgical history that includes Thyroidectomy (2005); Cardiac catheterization (2006); Total abdominal hysterectomy; Breast surgery (Right, March 2014); Breast cyst aspiration (Right, 2014); and  Esophagogastroduodenoscopy (N/A, 04/07/2020). Family: family history includes Alcohol abuse in her father, maternal aunt, and mother; Cancer in her brother, brother, brother, and maternal aunt; Heart disease in her brother.  Laboratory Chemistry Profile   Renal Lab Results  Component Value Date   BUN 15 04/07/2020   CREATININE 0.82 04/07/2020   BCR 22 01/02/2020   GFR 60.34 01/21/2015   GFRAA 61 01/02/2020   GFRNONAA >60 04/07/2020    Hepatic Lab Results  Component Value Date   AST 14 01/02/2020   ALT 7 01/02/2020   ALBUMIN 4.4 08/29/2017   ALKPHOS 126 (H) 08/29/2017   HCVAB NEGATIVE 01/21/2015    Electrolytes Lab Results  Component Value Date   NA 140 04/07/2020   K 3.9 04/07/2020   CL 104 04/07/2020   CALCIUM 9.8 04/07/2020   MG 1.9 06/04/2018    Bone Lab Results  Component Value Date   VD25OH 26.66 (L) 05/14/2014    Inflammation (CRP: Acute Phase) (ESR: Chronic Phase) Lab Results  Component Value Date   ESRSEDRATE 19 03/30/2012         Note: Above Lab results reviewed.  Recent Imaging Review  DG PAIN CLINIC C-ARM 1-60 MIN NO REPORT Fluoro was used, but no Radiologist interpretation will be provided.  Please refer to "NOTES" tab for provider progress note. Note: Reviewed        Physical Exam  General appearance: Well nourished, well developed, and well hydrated. In no apparent acute distress Mental status: Alert, oriented x 3 (person, place, & time)       Respiratory: No evidence of acute respiratory distress Eyes: PERLA Vitals: BP 108/74 (BP Location: Left Arm, Patient Position: Sitting, Cuff Size: Large)   Pulse 98   Temp (!) 97.1 F (36.2 C) (Temporal)   Resp 16   Ht $R'5\' 8"'WI$  (1.727 m)   Wt 200 lb (90.7 kg)   SpO2 99%   BMI 30.41 kg/m  BMI: Estimated body mass index is 30.41 kg/m as calculated from the following:   Height as of this encounter: $RemoveBeforeD'5\' 8"'nLlVHqegvBtEjE$  (1.727 m).   Weight as of this encounter: 200 lb (90.7 kg). Ideal: Ideal body weight: 63.9 kg  (140 lb 14 oz) Adjusted ideal body weight: 74.6 kg (164 lb 8.4 oz)  Limited range of motion of left knee, pain with weightbearing Arthropathic pain pattern of left knee  Assessment   Status Diagnosis  Worsening Worsening Controlled 1. Primary osteoarthritis of left knee   2. Bilateral primary osteoarthritis of knee   3. Chronic use of opiate for therapeutic purpose   4. Chronic radicular lumbar pain (left)   5. Plantar fascia syndrome   6. Chronic pain of right ankle   7. Chronic pain syndrome      Updated Problems: No problems  updated.  Plan of Care   [name] has a current medication list which includes the following long-term medication(s): albuterol, levothyroxine, lisinopril-hydrochlorothiazide, omeprazole, and gabapentin.  Pharmacotherapy (Medications Ordered): Meds ordered this encounter  Medications   HYDROcodone-acetaminophen (NORCO) 7.5-325 MG tablet    Sig: Take 1 tablet by mouth every 6 (six) hours as needed for severe pain. Must last 30 days.    Dispense:  105 tablet    Refill:  0    [place] STOP ACT - Not applicable. Fill one day early if pharmacy is closed on scheduled refill date.   HYDROcodone-acetaminophen (NORCO) 7.5-325 MG tablet    Sig: Take 1 tablet by mouth every 6 (six) hours as needed for severe pain. Must last 30 days.    Dispense:  105 tablet    Refill:  0    [place] STOP ACT - Not applicable. Fill one day early if pharmacy is closed on scheduled refill date.   HYDROcodone-acetaminophen (NORCO) 7.5-325 MG tablet    Sig: Take 1 tablet by mouth every 6 (six) hours as needed for severe pain. Must last 30 days.    Dispense:  105 tablet    Refill:  0    [place] STOP ACT - Not applicable. Fill one day early if pharmacy is closed on scheduled refill date.   gabapentin (NEURONTIN) 100 MG capsule    Sig: Take 1-3 capsules (100-300 mg total) by mouth at bedtime. Follow written titration schedule.    Dispense:  90 capsule    Refill:  5    Fill one day  early if pharmacy is closed on scheduled refill date. May substitute for generic if available.   Orders:  Orders Placed This Encounter  Procedures   KNEE INJECTION    Local Anesthetic & Steroid injection.    Standing Status:   Future    Standing Expiration Date:   02/03/2021    Scheduling Instructions:     Side: LEFT     Sedation: None     Timeframe: As soon as schedule allows    Order Specific Question:   Where will this procedure be performed?    Answer:   [facility]   [employer] Select 13 (MW), Urine    Volume: 30 ml(s). Minimum 3 ml of urine is needed. Document temperature of fresh sample. Indications: Long term (current) use of opiate analgesic (X83.291)    Order Specific Question:   Release to patient    Answer:   Immediate   Follow-up plan:   Return in about 2 weeks (around 11/17/2020) for knee steroid injection.     Status post left genicular RFA 04/16/2018, status post right genicular RFA 03/12/2018.  Provided her with benefit.  Status post left knee intra-articular steroid injection #3 on 11/21/2018.   hyalgan left knee #1 on 04/17/19, #2 05/22/19, #3 07/03/2019, #4 08/28/19, #5 on 09/25/19             Recent Visits Date Type Provider Dept  09/23/20 Telemedicine Gillis Santa, MD [facility]  08/26/20 Procedure visit Gillis Santa, MD [facility]  Showing recent visits within past 90 days and meeting all other requirements Today's Visits Date Type Provider Dept  11/03/20 Office Visit Gillis Santa, MD [facility]  Showing today's visits and meeting all other requirements Future Appointments No visits were found meeting these conditions. Showing future appointments within next 90 days and meeting all other requirements I discussed the assessment and treatment plan with the patient.  The patient was provided an opportunity to ask questions and all were answered. The patient agreed with the plan and demonstrated an understanding of the  instructions.  Patient advised to call back or seek an in-person evaluation if the symptoms or condition worsens.  Duration of encounter: 5minutes.  Note by: Gillis Santa, MD Date: 11/03/2020; Time: 9:20 AM

## 2020-11-03 NOTE — Progress Notes (Signed)
Nursing Pain Medication Assessment:  Safety precautions to be maintained throughout the outpatient stay will include: orient to surroundings, keep bed in low position, maintain call bell within reach at all times, provide assistance with transfer out of bed and ambulation.  Medication Inspection Compliance: Pill count conducted under aseptic conditions, in front of the patient. Neither the pills nor the bottle was removed from the patient's sight at any time. Once count was completed pills were immediately returned to the patient in their original bottle.  Medication: Hydrocodone/APAP Pill/Patch Count:  39 of 105 pills remain Pill/Patch Appearance: Markings consistent with prescribed medication Bottle Appearance: Standard pharmacy container. Clearly labeled. Filled Date: 07 / 06 / 2022 Last Medication intake:  Yesterday

## 2020-11-04 DIAGNOSIS — G894 Chronic pain syndrome: Secondary | ICD-10-CM | POA: Diagnosis not present

## 2020-11-07 LAB — TOXASSURE SELECT 13 (MW), URINE

## 2020-11-09 ENCOUNTER — Other Ambulatory Visit: Payer: Self-pay

## 2020-11-09 ENCOUNTER — Ambulatory Visit
Payer: Medicare HMO | Attending: Student in an Organized Health Care Education/Training Program | Admitting: Student in an Organized Health Care Education/Training Program

## 2020-11-09 ENCOUNTER — Encounter: Payer: Self-pay | Admitting: Student in an Organized Health Care Education/Training Program

## 2020-11-09 DIAGNOSIS — M1712 Unilateral primary osteoarthritis, left knee: Secondary | ICD-10-CM

## 2020-11-09 DIAGNOSIS — M170 Bilateral primary osteoarthritis of knee: Secondary | ICD-10-CM | POA: Insufficient documentation

## 2020-11-09 MED ORDER — TRIAMCINOLONE ACETONIDE 32 MG IX SRER
32.0000 mg | Freq: Once | INTRA_ARTICULAR | Status: AC
  Administered 2020-11-09: 32 mg via INTRA_ARTICULAR
  Filled 2020-11-09: qty 1

## 2020-11-09 MED ORDER — LIDOCAINE HCL 2 % IJ SOLN
20.0000 mL | Freq: Once | INTRAMUSCULAR | Status: AC
  Administered 2020-11-09: 200 mg
  Filled 2020-11-09: qty 20

## 2020-11-09 NOTE — Progress Notes (Signed)
PROVIDER NOTE: Interpretation of information contained herein should be left to medically-trained personnel. Specific patient instructions are provided elsewhere under "Patient Instructions" section of medical record. This document was created in part using STT-dictation technology, any transcriptional errors that may result from this process are unintentional.  Patient: Pamela Romero Type: Established DOB: 1950/09/18 MRN: [id number] PCP: [name], FNP  Service: Procedure DOS: 11/09/2020 Setting: Ambulatory Location: Ambulatory outpatient facility Delivery: Face-to-face Provider: Gillis Santa, MD Specialty: Interventional Pain Management Specialty designation: 09 Location: Outpatient facility Ref. Prov.: Gillis Santa, MD   Procedure [facility] Interventional Pain Management )    Procedure: Steroid Knee Injection (LONG ACTING Kennalog) #1 Laterality: Left (-LT) Level: Intra-articular  No.: n/a Series: n/a Purpose: Diagnostic/Therapeutic Indications: Knee arthralgia associated to osteoarthritis of the knee   Imaging: None required ([id number]) Analgesia: Skin infiltration w/ local anesthetics Sedation: None  NAS-11 score:   Pre-procedure: 7 /10   Post-procedure: 0-No pain/10      1. Primary osteoarthritis of left knee   2. Bilateral primary osteoarthritis of knee    Pre-Procedure Preparation  Monitoring: As per clinic protocol.  Risk Assessment: Vitals:  [id number] body mass index is 30.41 kg/m as calculated from the following:   Height as of this encounter: '5\' 8"'$  (1.727 m).   Weight as of this encounter: 200 lb (90.7 kg)., Rate:91 , BP:123/77, Resp:18, Temp:(!) 97 F (36.1 C), SpO2:98 %  Allergies: She has No Known Allergies.  Precautions: None required  Blood-thinner(s): None at this time  Coagulopathies: Reviewed. None identified.   Active Infection(s): Reviewed. None identified. Pamela Romero is afebrile   Location setting: Exam room Position: Sitting w/ knee  bent 90 degrees Safety Precautions: Patient was assessed for positional comfort and pressure points before starting the procedure. Prepping solution: DuraPrep (Iodine Povacrylex [0.7% available iodine] and Isopropyl Alcohol, 74% w/w) Prep Area: Entire knee region Approach: percutaneous, just above the tibial plateau, lateral to the infrapatellar tendon. Intended target: Intra-articular knee space Materials: Tray: Block Needle(s): Regular Qty: 1/side Length: 1.5-inch Gauge: 25G   Meds ordered this encounter  Medications   lidocaine (XYLOCAINE) 2 % (with pres) injection 400 mg   Triamcinolone Acetonide SRER 32 mg    Maintain refrigerated.  Prepared suspension may be stored up to 4 hours at ambient conditions.    No orders of the defined types were placed in this encounter.    Time-out: 1057 I initiated and conducted the "Time-out" before starting the procedure, as per protocol. The patient was asked to participate by confirming the accuracy of the "Time Out" information. Verification of the correct person, site, and procedure were performed and confirmed by me, the nursing staff, and the patient. "Time-out" conducted as per Joint Commission's Universal Protocol (UP.01.01.01). Procedure checklist: Completed   H&P (Pre-op  Assessment)  Pamela Romero is a 70 y.o. (year old), female patient, seen today for interventional treatment. She  has a past surgical history that includes Thyroidectomy (2005); Cardiac catheterization (2006); Total abdominal hysterectomy; Breast surgery (Right, March 2014); Breast cyst aspiration (Right, 2014); and Esophagogastroduodenoscopy (N/A, 04/07/2020). Pamela Romero has a current medication list which includes the following prescription(s): albuterol, esomeprazole, gabapentin, [START ON 11/13/2020] hydrocodone-acetaminophen, [START ON 12/13/2020] hydrocodone-acetaminophen, [START ON 01/12/2021] hydrocodone-acetaminophen, levothyroxine, lisinopril-hydrochlorothiazide, and  omeprazole. Her primarily concern today is the Knee Pain  She has No Known Allergies.   Last encounter: My last encounter with her was on 11/03/2020. Pertinent problems: Pamela Romero has Major depressive disorder, recurrent, mild (Angelina); Chronic pain of left knee; Acute  left-sided low back pain without sciatica; Bilateral primary osteoarthritis of knee; Chronic pain of right ankle; Primary osteoarthritis of right ankle; and Osteoporosis on their pertinent problem list. Pain Assessment: Severity of Chronic pain is reported as a 7 /10. Location: Knee Left/Radaites from left knee into left foot (top of left foot). Onset: More than a month ago. Quality: Constant, Sharp. Timing: Constant. Modifying factor(s): Medicine helps for aware, sitting and rotating foot helps. Vitals:  height is '5\' 8"'$  (1.727 m) and weight is 200 lb (90.7 kg). Her temporal temperature is 97 F (36.1 C) (abnormal). Her blood pressure is 123/77 and her pulse is 91. Her respiration is 18 and oxygen saturation is 98%.   Reason for encounter: "interventional pain management therapy due pain of at least four (4) weeks in duration, with failure to respond and/or inability to tolerate more conservative care.     Related imaging: Knee-L MR wo contrast:  Results for orders placed during the hospital encounter of 09/01/17  MR KNEE LEFT WO CONTRAST  Narrative CLINICAL DATA:  Persistent anterior knee pain for 1.5 months.  EXAM: MRI OF THE LEFT KNEE WITHOUT CONTRAST  TECHNIQUE: Multiplanar, multisequence MR imaging of the knee was performed. No intravenous contrast was administered.  COMPARISON:  None.  FINDINGS: MENISCI  Medial meniscus: There is an extensive horizontal tear posterior horn extending from the superior surface to the periphery. The midbody is degenerated and peripherally subluxed.  Lateral meniscus: Extensive horizontal tear involving the anterior horn and midbody with small parameniscal cysts at the level of  the midbody best seen on image 34 of series 7 and series 31 of series 5.  LIGAMENTS  Cruciates:  Intact.  Collaterals:  Intact.  CARTILAGE  Patellofemoral: Partial-thickness cartilage loss of the superior aspect of the medial facet of the patella.  Medial: Denuding of the articular cartilage of the medial tibial plateau with subcortical edema and subcortical cyst formation in the periphery of the tibial plateau. Thinning of the articular cartilage of the femoral condyle.  Lateral: Focal full-thickness cartilage loss in the posterior aspect of the lateral tibial plateau.  Joint: Trace joint effusion. Normal Hoffa's fat pad. No plical thickening.  Popliteal Fossa:  Small Baker's cyst.  Intact popliteus tendon.  Extensor Mechanism:  Normal.  Bones: Moderate marginal osteophytes in the medial compartment. Tiny marginal osteophytes in the lateral compartment.  Other: None  IMPRESSION: 1. Extensive horizontal tear of the posterior horn of the medial meniscus. 2. Extensive horizontal tear of the anterior horn and midbody of the lateral meniscus. 3. Moderate osteoarthritis of the medial compartment with full-thickness cartilage loss and subcortical edema and subcortical cyst formation in the medial tibial plateau.   Electronically Signed By: Lorriane Shire M.D. On: 09/01/2017 10:59  Knee-R MR wo contrast:  No results found for this or any previous visit.  Knee-R DG 1-2 views:  No results found for this or any previous visit.  Knee-L DG 1-2 views:  No results found for this or any previous visit.  Knee-R DG 3 views:  No results found for this or any previous visit.  Knee-L DG 3 views:  No results found for this or any previous visit.  Knee-R DG 4 views:  No results found for this or any previous visit.  Knee-L DG 4 views:  No results found for this or any previous visit.    Site Confirmation: Ms. Carbonaro was asked to confirm the procedure and laterality  before marking the site.  Consent: Before the procedure  and under the influence of no sedative(s), amnesic(s), or anxiolytics, the patient was informed of the treatment options, risks and possible complications. To fulfill our ethical and legal obligations, as recommended by the American Medical Association's Code of Ethics, I have informed the patient of my clinical impression; the nature and purpose of the treatment or procedure; the risks, benefits, and possible complications of the intervention; the alternatives, including doing nothing; the risk(s) and benefit(s) of the alternative treatment(s) or procedure(s); and the risk(s) and benefit(s) of doing nothing. The patient was provided information about the general risks and possible complications associated with the procedure. These may include, but are not limited to: failure to achieve desired goals, infection, bleeding, organ or nerve damage, allergic reactions, paralysis, and death. In addition, the patient was informed of those risks and complications associated to Spine-related procedures, such as failure to decrease pain; infection (i.e.: Meningitis, epidural or intraspinal abscess); bleeding (i.e.: epidural hematoma, subarachnoid hemorrhage, or any other type of intraspinal or peri-dural bleeding); organ or nerve damage (i.e.: Any type of peripheral nerve, nerve root, or spinal cord injury) with subsequent damage to sensory, motor, and/or autonomic systems, resulting in permanent pain, numbness, and/or weakness of one or several areas of the body; allergic reactions; (i.e.: anaphylactic reaction); and/or death. Furthermore, the patient was informed of those risks and complications associated with the medications. These include, but are not limited to: allergic reactions (i.e.: anaphylactic or anaphylactoid reaction(s)); adrenal axis suppression; blood sugar elevation that in diabetics may result in ketoacidosis or comma; water retention that in  patients with history of congestive heart failure may result in shortness of breath, pulmonary edema, and decompensation with resultant heart failure; weight gain; swelling or edema; medication-induced neural toxicity; particulate matter embolism and blood vessel occlusion with resultant organ, and/or nervous system infarction; and/or aseptic necrosis of one or more joints. Finally, the patient was informed that Medicine is not an exact science; therefore, there is also the possibility of unforeseen or unpredictable risks and/or possible complications that may result in a catastrophic outcome. The patient indicated having understood very clearly. We have given the patient no guarantees and we have made no promises. Enough time was given to the patient to ask questions, all of which were answered to the patient's satisfaction. Ms. Helman has indicated that she wanted to continue with the procedure. Attestation: I, the ordering provider, attest that I have discussed with the patient the benefits, risks, side-effects, alternatives, likelihood of achieving goals, and potential problems during recovery for the procedure that I have provided informed consent.  Date  Time: 11/09/2020 10:24 AM   Prophylactic antibiotics  Anti-infectives (From admission, onward)    None      Indication(s): None identified   Description of procedure   Start Time: 1059 hrs  Local Anesthesia: Once the patient was positioned, prepped, and time-out was completed. The target area was identified located. The skin was marked with an approved surgical skin marker. Once marked, the skin (epidermis, dermis, and hypodermis), and deeper tissues (fat, connective tissue and muscle) were infiltrated with a small amount of a short-acting local anesthetic, loaded on a 10cc syringe with a 25G, 1.5-in  Needle. An appropriate amount of time was allowed for local anesthetics to take effect before proceeding to the next step. Local Anesthetic:  Lidocaine 1-2% The unused portion of the local anesthetic was discarded in the proper designated containers. Safety Precautions: Aspiration looking for blood return was conducted prior to all injections. At no point did I  inject any substances, as a needle was being advanced. Before injecting, the patient was told to immediately notify me if she was experiencing any new onset of "ringing in the ears, or metallic taste in the mouth". No attempts were made at seeking any paresthesias. Safe injection practices and needle disposal techniques used. Medications properly checked for expiration dates. SDV (single dose vial) medications used. After the completion of the procedure, all disposable equipment used was discarded in the proper designated medical waste containers.  Technical description: Protocol guidelines were followed. After positioning, the target area was identified and prepped in the usual manner. Skin & deeper tissues infiltrated with local anesthetic. Appropriate amount of time allowed to pass for local anesthetics to take effect. Proper needle placement secured. Once satisfactory needle placement was confirmed, I proceeded to inject the desired solution in slow, incremental fashion, intermittently assessing for discomfort or any signs of abnormal or undesired spread of substance. Once completed, the needle was removed and disposed of, as per hospital protocols. The area was cleaned, making sure to leave some of the prepping solution back to take advantage of its long term bactericidal properties.  Aspiration:  Negative   Vitals:   11/09/20 1028  BP: 123/77  Pulse: 91  Resp: 18  Temp: (!) 97 F (36.1 C)  TempSrc: Temporal  SpO2: 98%  Weight: 200 lb (90.7 kg)  Height: '5\' 8"'$  (1.727 m)    End Time: 1059 hrs     Post-op assessment  Post-procedure Vital Signs:  Pulse/HCG Rate: 91  Temp: (!) 97 F (36.1 C)  Resp: 18 BP: 123/77 SpO2: 98 %  EBL: None  Complications: No immediate  post-treatment complications observed by team, or reported by patient.  Note: The patient tolerated the entire procedure well. A repeat set of vitals were taken after the procedure and the patient was kept under observation following institutional policy, for this type of procedure. Post-procedural neurological assessment was performed, showing return to baseline, prior to discharge. The patient was provided with post-procedure discharge instructions, including a section on how to identify potential problems. Should any problems arise concerning this procedure, the patient was given instructions to immediately contact us, at any time, without hesitation. In any case, we plan to contact the patient by telephone for a follow-up status report regarding this interventional procedure.  Comments:  No additional relevant information.   Plan of care  Chronic Opioid Analgesic:  Hydrocodone 7.5 mg 3 times daily as needed with an extra quantity 15/month (#105)For severe breakthrough pain    Medications administered: We administered lidocaine and Triamcinolone Acetonide.  Follow-up plan:   Return in about 3 weeks (around 11/30/2020), or 3-4 weeks, for PP in person.      Status post left genicular RFA 04/16/2018, status post right genicular RFA 03/12/2018.  Provided her with benefit.  Status post left knee intra-articular steroid injection #3 on 11/21/2018.   hyalgan left knee #1 on 04/17/19, #2 05/22/19, #3 07/03/2019, #4 08/28/19, #5 on 09/25/19              Recent Visits Date Type Provider Dept  11/03/20 Office Visit Gillis Santa, MD [facility]  09/23/20 Telemedicine Gillis Santa, MD [facility]  08/26/20 Procedure visit Gillis Santa, MD [facility]  Showing recent visits within past 90 days and meeting all other requirements Today's Visits Date Type Provider Dept  11/09/20 Procedure visit Gillis Santa, MD [facility]  Showing today's visits and meeting all other  requirements Future  Appointments Date Type Provider Dept  12/03/20 Appointment Gillis Santa, MD [facility]  Showing future appointments within next 90 days and meeting all other requirements  Disposition: Discharge home  Discharge (Date  Time): 11/09/2020; 1105 hrs.   Primary Care Physician: [name], FNP Location: [facility] Note by: Gillis Santa, MD Date: 11/09/2020; Time: 11:10 AM  DISCLAIMER: Medicine is not an exact science. It has no guarantees or warranties. The decision to proceed with this intervention was based on the information collected from the patient. Conclusions were drawn from the patient's questionnaire, interview, and examination. Because information was provided in large part by the patient, it cannot be guaranteed that it has not been purposely or unconsciously manipulated or altered. Every effort has been made to obtain as much accurate, relevant, available data as possible. Always take into account that the treatment will also be dependent on availability of resources and existing treatment guidelines, considered by other Pain Management Specialists as being common knowledge and practice, at the time of the intervention. It is also important to point out that variation in procedural techniques and pharmacological choices are the acceptable norm. For Medico-Legal review purposes, the indications, contraindications, technique, and results of the these procedures should only be evaluated, judged and interpreted by a Board-Certified Interventional Pain Specialist with extensive familiarity and expertise in the same exact procedure and technique.

## 2020-11-09 NOTE — Progress Notes (Signed)
Safety precautions to be maintained throughout the outpatient stay will include: orient to surroundings, keep bed in low position, maintain call bell within reach at all times, provide assistance with transfer out of bed and ambulation.  

## 2020-11-09 NOTE — Patient Instructions (Signed)

## 2020-11-10 ENCOUNTER — Telehealth: Payer: Self-pay | Admitting: *Deleted

## 2020-11-10 NOTE — Telephone Encounter (Signed)
Denies any post procedure issues. 

## 2020-12-03 ENCOUNTER — Other Ambulatory Visit: Payer: Self-pay

## 2020-12-03 ENCOUNTER — Encounter: Payer: Self-pay | Admitting: Student in an Organized Health Care Education/Training Program

## 2020-12-03 ENCOUNTER — Ambulatory Visit
Payer: Medicare HMO | Attending: Student in an Organized Health Care Education/Training Program | Admitting: Student in an Organized Health Care Education/Training Program

## 2020-12-03 VITALS — BP 153/93 | HR 91 | Temp 96.7°F | Resp 16 | Ht 68.5 in | Wt 200.0 lb

## 2020-12-03 DIAGNOSIS — M1712 Unilateral primary osteoarthritis, left knee: Secondary | ICD-10-CM | POA: Insufficient documentation

## 2020-12-03 DIAGNOSIS — G894 Chronic pain syndrome: Secondary | ICD-10-CM | POA: Diagnosis not present

## 2020-12-03 DIAGNOSIS — M170 Bilateral primary osteoarthritis of knee: Secondary | ICD-10-CM | POA: Insufficient documentation

## 2020-12-03 NOTE — Progress Notes (Signed)
Safety precautions to be maintained throughout the outpatient stay will include: orient to surroundings, keep bed in low position, maintain call bell within reach at all times, provide assistance with transfer out of bed and ambulation.  

## 2020-12-03 NOTE — Progress Notes (Signed)
PROVIDER NOTE: Information contained herein reflects review and annotations entered in association with encounter. Interpretation of such information and data should be left to medically-trained personnel. Information provided to patient can be located elsewhere in the medical record under "Patient Instructions". Document created using STT-dictation technology, any transcriptional errors that may result from process are unintentional.    Patient: Pamela Romero  Service Category: E/M  Provider: Gillis Santa, MD  DOB: 1950-12-19  DOS: 12/03/2020  Specialty: Interventional Pain Management  MRN: [id number]  Setting: Ambulatory outpatient  PCP: [name], FNP  Type: Established Patient    Referring Provider: [name], FNP  Location: Office  Delivery: Face-to-face     HPI  [name], a 70 y.o. year old female, is here today because of her Primary osteoarthritis of left knee [M17.12]. [name] primary complain today is Knee Pain (left) Last encounter: My last encounter with her was on 11/09/20 Pertinent problems: [name] has Major depressive disorder, recurrent, mild ([place]); Chronic pain of left knee; Acute left-sided low back pain without sciatica; Bilateral primary osteoarthritis of knee; Chronic pain of right ankle; Primary osteoarthritis of right ankle; and Osteoporosis on their pertinent problem list. Pain Assessment: Severity of Chronic pain is reported as a 4 /10. Location: Knee Left/left knee to top of foot, effects all ltoes. Onset: More than a month ago. Quality: Constant, Sharp. Timing: Constant. Modifying factor(s): medications, sitting, rest, roll foot of bottle of ice. Vitals:  height is 5' 8.5" (1.74 m) and weight is 200 lb (90.7 kg). Her temperature is 96.7 F (35.9 C) (abnormal). Her blood pressure is 153/93 (abnormal) and her pulse is 91. Her respiration is 16 and oxygen saturation is 99%.   Reason for encounter:   Post procedure evaluation after left knee  steroid injection which was done with extended release Kenalog.  Patient states that she is experiencing satisfactory pain relief regarding her left knee pain.  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  She states that her knee pain correlates with her activity level each day.  Overall she notes an improvement in her ability to bear weight on her left knee.  Continue medication management visit as scheduled.   Post-Procedure Evaluation  Procedure (11/09/2020):  Left knee Kenalog extended release steroid injection  Anxiolysis: Please see nurses note.  Effectiveness during initial hour after procedure (Ultra-Short Term Relief): 100 %   Local anesthetic used: Long-acting (4-6 hours) Effectiveness: Defined as any analgesic benefit obtained secondary to the administration of local anesthetics. This carries significant diagnostic value as to the etiological location, or anatomical origin, of the pain. Duration of benefit is expected to coincide with the duration of the local anesthetic used.  Effectiveness during initial 4-6 hours after procedure (Short-Term Relief): 100 %   Long-term benefit: Defined as any relief past the pharmacologic duration of the local anesthetics.  Effectiveness past the initial 6 hours after procedure (Long-Term Relief): 50 %   Benefits, current: Defined as benefit present at the time of this evaluation.   Analgesia:  50% Function: Somewhat improved    ROS  Constitutional: Denies any fever or chills Gastrointestinal: No reported hemesis, hematochezia, vomiting, or acute GI distress Musculoskeletal:  Left knee pain  improved Neurological: No reported episodes of acute onset apraxia, aphasia, dysarthria, agnosia, amnesia, paralysis, loss of coordination, or loss of consciousness  Medication Review  HYDROcodone-acetaminophen, albuterol, esomeprazole, gabapentin, levothyroxine, lisinopril-hydrochlorothiazide, and  omeprazole  History Review  Allergy: [name] has No Known Allergies. Drug: [name]  reports no history of drug use. Alcohol:  reports no history of alcohol use. Tobacco:  reports that she quit smoking about 2 years ago. Her smoking use included cigarettes. She has a 25.00 pack-year smoking history. She has quit using smokeless tobacco. Social: [name]  reports that she quit smoking about 2 years ago. Her smoking use included cigarettes. She has a 25.00 pack-year smoking history. She has quit using smokeless tobacco. She reports that she does not drink alcohol and does not use drugs. Medical:  has a past medical history of Abnormal EKG, CAD (coronary artery disease), Cancer ([place]), COPD (chronic obstructive pulmonary disease) ([place]), Endometrial adenocarcinoma ([place]) (2011), Hyperlipidemia, Hypertension, Osteoarthritis, Reflux gastritis, Thyroid disease, tobacco abuse, and Vaginal delivery. Surgical: [name]  has a past surgical history that includes Thyroidectomy (2005); Cardiac catheterization (2006); Total abdominal hysterectomy; Breast surgery (Right, March 2014); Breast cyst aspiration (Right, 2014); and Esophagogastroduodenoscopy (N/A, 04/07/2020). Family: family history includes Alcohol abuse in her father, maternal aunt, and mother; Cancer in her brother, brother, brother, and maternal aunt; Heart disease in her brother.  Laboratory Chemistry Profile   Renal Lab Results  Component Value Date   BUN 15 04/07/2020   CREATININE 0.82 04/07/2020   BCR 22 01/02/2020   GFR 60.34 01/21/2015   GFRAA 61 01/02/2020   GFRNONAA >60 04/07/2020    Hepatic Lab Results  Component Value Date   AST 14 01/02/2020   ALT 7 01/02/2020   ALBUMIN 4.4 08/29/2017   ALKPHOS 126 (H) 08/29/2017   HCVAB NEGATIVE 01/21/2015    Electrolytes Lab Results  Component Value Date   NA 140 04/07/2020   K 3.9 04/07/2020   CL 104 04/07/2020   CALCIUM 9.8 04/07/2020   MG 1.9 06/04/2018     Bone Lab Results  Component Value Date   VD25OH 26.66 (L) 05/14/2014    Inflammation (CRP: Acute Phase) (ESR: Chronic Phase) Lab Results  Component Value Date   ESRSEDRATE 19 03/30/2012         Note: Above Lab results reviewed.  Recent Imaging Review  DG PAIN CLINIC C-ARM 1-60 MIN NO REPORT Fluoro was used, but no Radiologist interpretation will be provided.  Please refer to "NOTES" tab for provider progress note. Note: Reviewed        Physical Exam  General appearance: Well nourished, well developed, and well hydrated. In no apparent acute distress Mental status: Alert, oriented x 3 (person, place, & time)       Respiratory: No evidence of acute respiratory distress Eyes: PERLA Vitals: BP (!) 153/93   Pulse 91   Temp (!) 96.7 F (35.9 C)   Resp 16   Ht 5' 8.5" (1.74 m)   Wt 200 lb (90.7 kg)   SpO2 99%   BMI 29.97 kg/m  BMI: Estimated body mass index is 29.97 kg/m as calculated from the following:   Height as of this encounter: 5' 8.5" (1.74 m).   Weight as of this encounter: 200 lb (90.7 kg). Ideal: Ideal body weight: 65 kg (143 lb 6.5 oz) Adjusted ideal body weight: 75.3 kg (166 lb 0.7 oz)  Limited range of motion of left knee, pain with weightbearing Arthropathic pain pattern of left knee  Assessment   Status Diagnosis  Improved Persistent Controlled 1. Primary osteoarthritis of left knee   2. Bilateral primary osteoarthritis of knee   3. Chronic pain syndrome         Plan  of Care   Follow-up for medication management  Follow-up plan:   Return for Keep sch. appt.     Status post left genicular RFA 04/16/2018, status post right genicular RFA 03/12/2018.  Provided her with benefit.  Status post left knee intra-articular steroid injection #3 on 11/21/2018.   hyalgan left knee #1 on 04/17/19, #2 05/22/19, #3 07/03/2019, #4 08/28/19, #5 on 09/25/19             Recent Visits Date Type Provider Dept  11/09/20 Procedure visit Gillis Santa, MD [facility]  11/03/20 Office Visit Gillis Santa, MD [facility]  09/23/20 Telemedicine Gillis Santa, MD [facility]  Showing recent visits within past 90 days and meeting all other requirements Today's Visits Date Type Provider Dept  12/03/20 Office Visit Gillis Santa, MD [facility]  Showing today's visits and meeting all other requirements Future Appointments Date Type Provider Dept  02/09/21 Appointment Gillis Santa, MD [facility]  Showing future appointments within next 90 days and meeting all other requirements I discussed the assessment and treatment plan with the patient. The patient was provided an opportunity to ask questions and all were answered. The patient agreed with the plan and demonstrated an understanding of the instructions.  Patient advised to call back or seek an in-person evaluation if the symptoms or condition worsens.  Duration of encounter: 43mnutes.  Note by: BGillis Santa MD Date: 12/03/2020; Time: 9:19 AM

## 2020-12-12 ENCOUNTER — Telehealth: Payer: Self-pay | Admitting: Family Medicine

## 2020-12-12 NOTE — Telephone Encounter (Signed)
Copied from [place] [id number]. Topic: Medicare AWV >> Dec 12, 2020  9:23 AM Cher Nakai R wrote: Reason for CRM:  No answer unable to leave a message for patient to call back and schedule Medicare Annual Wellness Visit (AWV) in office.   If unable to come into the office for AWV,  please offer to do virtually or by telephone.  Last AWV: 09/24/2019  Please schedule at anytime with [place].  40 minute appointment  Any questions, please contact me at [phone]

## 2021-02-09 ENCOUNTER — Ambulatory Visit
Payer: Medicare HMO | Attending: Student in an Organized Health Care Education/Training Program | Admitting: Student in an Organized Health Care Education/Training Program

## 2021-02-09 ENCOUNTER — Other Ambulatory Visit: Payer: Self-pay

## 2021-02-09 ENCOUNTER — Encounter: Payer: Self-pay | Admitting: Student in an Organized Health Care Education/Training Program

## 2021-02-09 VITALS — BP 114/83 | HR 87 | Temp 97.2°F | Ht 68.5 in | Wt 200.0 lb

## 2021-02-09 DIAGNOSIS — M25571 Pain in right ankle and joints of right foot: Secondary | ICD-10-CM | POA: Diagnosis not present

## 2021-02-09 DIAGNOSIS — Z79891 Long term (current) use of opiate analgesic: Secondary | ICD-10-CM | POA: Insufficient documentation

## 2021-02-09 DIAGNOSIS — M5416 Radiculopathy, lumbar region: Secondary | ICD-10-CM | POA: Insufficient documentation

## 2021-02-09 DIAGNOSIS — G894 Chronic pain syndrome: Secondary | ICD-10-CM | POA: Insufficient documentation

## 2021-02-09 DIAGNOSIS — M170 Bilateral primary osteoarthritis of knee: Secondary | ICD-10-CM | POA: Diagnosis not present

## 2021-02-09 DIAGNOSIS — G8929 Other chronic pain: Secondary | ICD-10-CM | POA: Diagnosis not present

## 2021-02-09 DIAGNOSIS — M1712 Unilateral primary osteoarthritis, left knee: Secondary | ICD-10-CM | POA: Diagnosis not present

## 2021-02-09 MED ORDER — GABAPENTIN 300 MG PO CAPS: 300.0000 mg | ORAL_CAPSULE | Freq: Every day | ORAL | 0 refills | Status: DC

## 2021-02-09 MED ORDER — HYDROCODONE-ACETAMINOPHEN 7.5-325 MG PO TABS: 1.0000 | ORAL_TABLET | Freq: Four times a day (QID) | ORAL | 0 refills | Status: AC | PRN

## 2021-02-09 MED ORDER — HYDROCODONE-ACETAMINOPHEN 7.5-325 MG PO TABS: 1.0000 | ORAL_TABLET | Freq: Four times a day (QID) | ORAL | 0 refills | Status: DC | PRN

## 2021-02-09 NOTE — Progress Notes (Signed)
Safety precautions to be maintained throughout the outpatient stay will include: orient to surroundings, keep bed in low position, maintain call bell within reach at all times, provide assistance with transfer out of bed and ambulation.   Nursing Pain Medication Assessment:  Safety precautions to be maintained throughout the outpatient stay will include: orient to surroundings, keep bed in low position, maintain call bell within reach at all times, provide assistance with transfer out of bed and ambulation.  Medication Inspection Compliance: Pamela Romero did not comply with our request to bring her pills to be counted. She was reminded that bringing the medication bottles, even when empty, is a requirement.  Medication: Hydrocodone/APAP Pill/Patch Count:  12 of 105 pills remain Pill/Patch Appearance: Markings consistent with prescribed medication Bottle Appearance: Standard pharmacy container. Clearly labeled. Filled Date: 10 / 04 / 2022 Last Medication intake:  Today

## 2021-02-09 NOTE — Patient Instructions (Signed)

## 2021-02-09 NOTE — Progress Notes (Signed)
PROVIDER NOTE: Information contained herein reflects review and annotations entered in association with encounter. Interpretation of such information and data should be left to medically-trained personnel. Information provided to patient can be located elsewhere in the medical record under "Patient Instructions". Document created using STT-dictation technology, any transcriptional errors that may result from process are unintentional.    Patient: Pamela Romero  Service Category: E/M  Provider: Gillis Santa, MD  DOB: [birth year]  DOS: 02/09/2021  Specialty: Interventional Pain Management  MRN: [id number]  Setting: Ambulatory outpatient  PCP: [name], FNP  Type: Established Patient    Referring Provider: [name], FNP  Location: Office  Delivery: Face-to-face     HPI  [name], a 70 y.o. year old female, is here today because of her Bilateral primary osteoarthritis of knee [M17.0]. Ms. Sherman primary complain today is Knee Pain Last encounter: My last encounter with her was on 12/03/20 Pertinent problems: Ms. Cappelli has Major depressive disorder, recurrent, mild ([place]); Chronic pain of left knee; Acute left-sided low back pain without sciatica; Bilateral primary osteoarthritis of knee; Chronic pain of right ankle; Primary osteoarthritis of right ankle; and Osteoporosis on their pertinent problem list. Pain Assessment: Severity of Chronic pain is reported as a 5 /10. Location: Knee Left, Right/[place] from mid knee and shoots down to toes (left side is worse however right knee started to act up).. Onset: More than a month ago. Quality: Throbbing, Aching, Constant. Timing: Constant. Modifying factor(s): Hydrocodne and ice bottle on foot and roll it. Vitals:  height is 5' 8.5" (1.74 m) and weight is 200 lb (90.7 kg). Her oral temperature is 97.2 F (36.2 C) (abnormal). Her blood pressure is 114/83 and her pulse is 87. Her oxygen saturation is 100%.   Reason for encounter:   MM and worsening knee pain  Refill of hydrocodone as below.  No change in dose. Patient requesting repeat long-acting Kenalog injection for both knees.  She previously had it done for her left knee in August that provided her with significant pain relief.   Pharmacotherapy Assessment  Analgesic: Hydrocodone 7.5 mg 3 times daily as needed with an extra quantity 15/month (#105)For severe breakthrough pain    Monitoring: [place] PMP: PDMP reviewed during this encounter.       Pharmacotherapy: No side-effects or adverse reactions reported. Compliance: No problems identified. Effectiveness: Clinically acceptable.  UDS:  Summary  Date Value Ref Range Status  11/04/2020 Note  Final    Comment:    ==================================================================== [employer] Select 13 (MW) ==================================================================== Test                             Result       Flag       Units  Drug Present and Declared for Prescription Verification   Hydrocodone                    982          EXPECTED   ng/mg creat   Hydromorphone                  186          EXPECTED   ng/mg creat   Dihydrocodeine                 211          EXPECTED   ng/mg creat   Norhydrocodone  1188         EXPECTED   ng/mg creat    Sources of hydrocodone include scheduled prescription medications.    Hydromorphone, dihydrocodeine and norhydrocodone are expected    metabolites of hydrocodone. Hydromorphone and dihydrocodeine are    also available as scheduled prescription medications.  ==================================================================== Test                      Result    Flag   Units      Ref Range   Creatinine              66               mg/dL      >=20 ==================================================================== Declared Medications:  The flagging and interpretation on this report are based on the  following declared medications.  Unexpected  results may arise from  inaccuracies in the declared medications.   **Note: The testing scope of this panel includes these medications:   Hydrocodone (Norco)   **Note: The testing scope of this panel does not include the  following reported medications:   Acetaminophen (Norco)  Albuterol (Ventolin HFA)  Esomeprazole (Nexium)  Gabapentin (Neurontin)  Hydrochlorothiazide (Zestoretic)  Levothyroxine (Synthroid)  Lisinopril (Zestoretic)  Omeprazole (Prilosec) ==================================================================== For clinical consultation, please call [phone]. ====================================================================       ROS  Constitutional: Denies any fever or chills Gastrointestinal: No reported hemesis, hematochezia, vomiting, or acute GI distress Musculoskeletal:  Left knee pain right knee pain as well Neurological: No reported episodes of acute onset apraxia, aphasia, dysarthria, agnosia, amnesia, paralysis, loss of coordination, or loss of consciousness  Medication Review  HYDROcodone-acetaminophen, albuterol, esomeprazole, gabapentin, levothyroxine, lisinopril-hydrochlorothiazide, and omeprazole  History Review  Allergy: Ms. Dicola has No Known Allergies. Drug: Ms. Baranski  reports no history of drug use. Alcohol:  reports no history of alcohol use. Tobacco:  reports that she quit smoking about 2 years ago. Her smoking use included cigarettes. She has a 25.00 pack-year smoking history. She has quit using smokeless tobacco. Social: Ms. Layson  reports that she quit smoking about 2 years ago. Her smoking use included cigarettes. She has a 25.00 pack-year smoking history. She has quit using smokeless tobacco. She reports that she does not drink alcohol and does not use drugs. Medical:  has a past medical history of Abnormal EKG, CAD (coronary artery disease), Cancer ([place]), COPD (chronic obstructive pulmonary disease) ([place]), Endometrial  adenocarcinoma ([place]) (2011), Hyperlipidemia, Hypertension, Osteoarthritis, Reflux gastritis, Thyroid disease, tobacco abuse, and Vaginal delivery. Surgical: Ms. Arterburn  has a past surgical history that includes Thyroidectomy (2005); Cardiac catheterization (2006); Total abdominal hysterectomy; Breast surgery (Right, March 2014); Breast cyst aspiration (Right, 2014); and Esophagogastroduodenoscopy (N/A, 04/07/2020). Family: family history includes Alcohol abuse in her father, maternal aunt, and mother; Cancer in her brother, brother, brother, and maternal aunt; Heart disease in her brother.  Laboratory Chemistry Profile   Renal Lab Results  Component Value Date   BUN 15 04/07/2020   CREATININE 0.82 04/07/2020   BCR 22 01/02/2020   GFR 60.34 01/21/2015   GFRAA 61 01/02/2020   GFRNONAA >60 04/07/2020    Hepatic Lab Results  Component Value Date   AST 14 01/02/2020   ALT 7 01/02/2020   ALBUMIN 4.4 08/29/2017   ALKPHOS 126 (H) 08/29/2017   HCVAB NEGATIVE 01/21/2015    Electrolytes Lab Results  Component Value Date   NA 140 04/07/2020   K 3.9 04/07/2020   CL 104 04/07/2020  CALCIUM 9.8 04/07/2020   MG 1.9 06/04/2018    Bone Lab Results  Component Value Date   VD25OH 26.66 (L) 05/14/2014    Inflammation (CRP: Acute Phase) (ESR: Chronic Phase) Lab Results  Component Value Date   ESRSEDRATE 19 03/30/2012         Note: Above Lab results reviewed.   Physical Exam  General appearance: Well nourished, well developed, and well hydrated. In no apparent acute distress Mental status: Alert, oriented x 3 (person, place, & time)       Respiratory: No evidence of acute respiratory distress Eyes: PERLA Vitals: BP 114/83   Pulse 87   Temp (!) 97.2 F (36.2 C) (Oral)   Ht 5' 8.5" (1.74 m)   Wt 200 lb (90.7 kg)   SpO2 100%   BMI 29.97 kg/m  BMI: Estimated body mass index is 29.97 kg/m as calculated from the following:   Height as of this encounter: 5' 8.5" (1.74 m).    Weight as of this encounter: 200 lb (90.7 kg). Ideal: Ideal body weight: 65 kg (143 lb 6.5 oz) Adjusted ideal body weight: 75.3 kg (166 lb 0.7 oz)  Limited range of motion of left knee, pain with weightbearing Arthropathic pain pattern of left knee pain right knee  Assessment   Status Diagnosis  Having a Flare-up Controlled Having a Flare-up 1. Bilateral primary osteoarthritis of knee   2. Chronic radicular lumbar pain (left)   3. Primary osteoarthritis of left knee   4. Chronic pain syndrome   5. Chronic use of opiate for therapeutic purpose   6. Chronic pain of right ankle          Plan of Care   Requested Prescriptions   Signed Prescriptions Disp Refills   HYDROcodone-acetaminophen (NORCO) 7.5-325 MG tablet 105 tablet 0    Sig: Take 1 tablet by mouth every 6 (six) hours as needed for severe pain. Must last 30 days.   HYDROcodone-acetaminophen (NORCO) 7.5-325 MG tablet 105 tablet 0    Sig: Take 1 tablet by mouth every 6 (six) hours as needed for severe pain. Must last 30 days.   HYDROcodone-acetaminophen (NORCO) 7.5-325 MG tablet 105 tablet 0    Sig: Take 1 tablet by mouth every 6 (six) hours as needed for severe pain. Must last 30 days.   gabapentin (NEURONTIN) 300 MG capsule 180 capsule 0    Sig: Take 1-2 capsules (300-600 mg total) by mouth at bedtime.    Orders Placed This Encounter  Procedures   KNEE INJECTION    Local Anesthetic & Steroid injection.    Standing Status:   Future    Standing Expiration Date:   05/12/2021    Scheduling Instructions:     Side: Bilateral     Sedation: None     Timeframe: As soon as schedule allows    Order Specific Question:   Where will this procedure be performed?    Answer:   [facility]      Follow-up plan:   Return in about 2 weeks (around 02/23/2021) for B/L knee steroid (ER Kennalog).     Status post left genicular RFA 04/16/2018, status post right genicular RFA 03/12/2018.  Provided her with benefit.  Status  post left knee intra-articular steroid injection #3 on 11/21/2018.   hyalgan left knee #1 on 04/17/19, #2 05/22/19, #3 07/03/2019, #4 08/28/19, #5 on 09/25/19             Recent Visits Date Type Provider Dept  12/03/20  Office Visit Gillis Santa, MD [facility]  Showing recent visits within past 90 days and meeting all other requirements Today's Visits Date Type Provider Dept  02/09/21 Office Visit Gillis Santa, MD [facility]  Showing today's visits and meeting all other requirements Future Appointments Date Type Provider Dept  05/06/21 Appointment Gillis Santa, MD [facility]  Showing future appointments within next 90 days and meeting all other requirements I discussed the assessment and treatment plan with the patient. The patient was provided an opportunity to ask questions and all were answered. The patient agreed with the plan and demonstrated an understanding of the instructions.  Patient advised to call back or seek an in-person evaluation if the symptoms or condition worsens.  Duration of encounter: 46minutes.  Note by: Gillis Santa, MD Date: 02/09/2021; Time: 10:14 AM

## 2021-02-24 ENCOUNTER — Encounter: Payer: Self-pay | Admitting: Student in an Organized Health Care Education/Training Program

## 2021-02-24 ENCOUNTER — Ambulatory Visit
Payer: Medicare HMO | Attending: Student in an Organized Health Care Education/Training Program | Admitting: Student in an Organized Health Care Education/Training Program

## 2021-02-24 ENCOUNTER — Other Ambulatory Visit: Payer: Self-pay

## 2021-02-24 DIAGNOSIS — M170 Bilateral primary osteoarthritis of knee: Secondary | ICD-10-CM

## 2021-02-24 DIAGNOSIS — G894 Chronic pain syndrome: Secondary | ICD-10-CM

## 2021-02-24 DIAGNOSIS — M1712 Unilateral primary osteoarthritis, left knee: Secondary | ICD-10-CM | POA: Insufficient documentation

## 2021-02-24 MED ORDER — LIDOCAINE HCL 2 % IJ SOLN
20.0000 mL | Freq: Once | INTRAMUSCULAR | Status: AC
  Administered 2021-02-24: 200 mg

## 2021-02-24 MED ORDER — TRIAMCINOLONE ACETONIDE 32 MG IX SRER
32.0000 mg | Freq: Once | INTRA_ARTICULAR | Status: AC
  Administered 2021-02-24: 32 mg via INTRA_ARTICULAR
  Filled 2021-02-24: qty 5

## 2021-02-24 MED ORDER — LIDOCAINE HCL (PF) 1 % IJ SOLN
INTRAMUSCULAR | Status: AC
  Filled 2021-02-24: qty 5

## 2021-02-24 NOTE — Progress Notes (Signed)
PROVIDER NOTE: Interpretation of information contained herein should be left to medically-trained personnel. Specific patient instructions are provided elsewhere under "Patient Instructions" section of medical record. This document was created in part using STT-dictation technology, any transcriptional errors that may result from this process are unintentional.  Patient: Pamela Romero Type: Established DOB: [birth year] MRN: [id number] PCP: [name], FNP  Service: Procedure DOS: 02/24/2021 Setting: Ambulatory Location: Ambulatory outpatient facility Delivery: Face-to-face Provider: Gillis Santa, MD Specialty: Interventional Pain Management Specialty designation: 09 Location: Outpatient facility Ref. Prov.: Gillis Santa, MD   Procedure [facility] Interventional Pain Management )    Procedure: Steroid Knee Injection (LONG ACTING Kennalog) #2 Laterality: Bilateral (-50) Level: Intra-articular  No.: R1 L2 Series: n/a Purpose: Therapeutic Indications: Knee arthralgia associated to osteoarthritis of the knee   Imaging: None required (CPT-20610) Analgesia: Skin infiltration w/ local anesthetics Sedation: None  NAS-11 score:   Pre-procedure: 7 /10   Post-procedure: 1 /10      1. Chronic pain syndrome   2. Primary osteoarthritis of left knee   3. Bilateral primary osteoarthritis of knee    Pre-Procedure Preparation  Monitoring: As per clinic protocol.  Risk Assessment: Vitals:  IRJ:JOACZYSAY body mass index is 29.53 kg/m as calculated from the following:   Height as of this encounter: 5\' 9"  (1.753 m).   Weight as of this encounter: 200 lb (90.7 kg)., Rate:89 , BP:(!) 116/91, Resp:16, Temp:(!) 96.8 F (36 C), SpO2:100 %  Allergies: She has No Known Allergies.  Precautions: None required  Blood-thinner(s): None at this time  Coagulopathies: Reviewed. None identified.   Active Infection(s): Reviewed. None identified. Pamela Romero is afebrile   Location setting: Exam  room Position: Sitting w/ knee bent 90 degrees Safety Precautions: Patient was assessed for positional comfort and pressure points before starting the procedure. Prepping solution: DuraPrep (Iodine Povacrylex [0.7% available iodine] and Isopropyl Alcohol, 74% w/w) Prep Area: Entire knee region Approach: percutaneous, just above the tibial plateau, medial to the infrapatellar tendon. Intended target: Intra-articular knee space Materials: Tray: Block Needle(s): Regular Qty: 1/side Length: 1.5-inch Gauge: 25G   Meds ordered this encounter  Medications   lidocaine (XYLOCAINE) 2 % (with pres) injection 400 mg   Triamcinolone Acetonide SRER 32 mg    Maintain refrigerated.  Prepared suspension may be stored up to 4 hours at ambient conditions.   Triamcinolone Acetonide SRER 32 mg    Maintain refrigerated.  Prepared suspension may be stored up to 4 hours at ambient conditions.     No orders of the defined types were placed in this encounter.    Time-out: 1109 I initiated and conducted the "Time-out" before starting the procedure, as per protocol. The patient was asked to participate by confirming the accuracy of the "Time Out" information. Verification of the correct person, site, and procedure were performed and confirmed by me, the nursing staff, and the patient. "Time-out" conducted as per Joint Commission's Universal Protocol (UP.01.01.01). Procedure checklist: Completed   H&P (Pre-op  Assessment)  Pamela Romero is a 70 y.o. (year old), female patient, seen today for interventional treatment. She  has a past surgical history that includes Thyroidectomy (2005); Cardiac catheterization (2006); Total abdominal hysterectomy; Breast surgery (Right, March 2014); Breast cyst aspiration (Right, 2014); and Esophagogastroduodenoscopy (N/A, 04/07/2020). Pamela Romero has a current medication list which includes the following prescription(s): albuterol, esomeprazole, gabapentin, hydrocodone-acetaminophen,  [START ON 03/13/2021] hydrocodone-acetaminophen, [START ON 04/12/2021] hydrocodone-acetaminophen, lisinopril-hydrochlorothiazide, levothyroxine, and omeprazole. Her primarily concern today is the Knee Pain (bilateral)  She has  No Known Allergies.   Last encounter: My last encounter with her was on 11/03/2020. Pertinent problems: Pamela Romero has Major depressive disorder, recurrent, mild ([place]); Chronic pain of left knee; Acute left-sided low back pain without sciatica; Bilateral primary osteoarthritis of knee; Chronic pain of right ankle; Primary osteoarthritis of right ankle; and Osteoporosis on their pertinent problem list. Pain Assessment: Severity of Chronic pain is reported as a 7 /10. Location: Knee Right, Left (bone on bone feeling)/legs below knee to ankles- cramping type feeling.. Onset: More than a month ago. Quality: Stabbing, Aching. Timing: Constant. Modifying factor(s): rest, medications, ice.. Vitals:  height is 5\' 9"  (1.753 m) and weight is 200 lb (90.7 kg). Her temporal temperature is 96.8 F (36 C) (abnormal). Her blood pressure is 116/77 and her pulse is 88. Her respiration is 16 and oxygen saturation is 100%.   Reason for encounter: "interventional pain management therapy due pain of at least four (4) weeks in duration, with failure to respond and/or inability to tolerate more conservative care.     Related imaging: Knee-L MR wo contrast:  Results for orders placed during the hospital encounter of 09/01/17  MR KNEE LEFT WO CONTRAST  Narrative CLINICAL DATA:  Persistent anterior knee pain for 1.5 months.  EXAM: MRI OF THE LEFT KNEE WITHOUT CONTRAST  TECHNIQUE: Multiplanar, multisequence MR imaging of the knee was performed. No intravenous contrast was administered.  COMPARISON:  None.  FINDINGS: MENISCI  Medial meniscus: There is an extensive horizontal tear posterior horn extending from the superior surface to the periphery. The midbody is degenerated and  peripherally subluxed.  Lateral meniscus: Extensive horizontal tear involving the anterior horn and midbody with small parameniscal cysts at the level of the midbody best seen on image 34 of series 7 and series 31 of series 5.  LIGAMENTS  Cruciates:  Intact.  Collaterals:  Intact.  CARTILAGE  Patellofemoral: Partial-thickness cartilage loss of the superior aspect of the medial facet of the patella.  Medial: Denuding of the articular cartilage of the medial tibial plateau with subcortical edema and subcortical cyst formation in the periphery of the tibial plateau. Thinning of the articular cartilage of the femoral condyle.  Lateral: Focal full-thickness cartilage loss in the posterior aspect of the lateral tibial plateau.  Joint: Trace joint effusion. Normal Hoffa's fat pad. No plical thickening.  Popliteal Fossa:  Small Baker's cyst.  Intact popliteus tendon.  Extensor Mechanism:  Normal.  Bones: Moderate marginal osteophytes in the medial compartment. Tiny marginal osteophytes in the lateral compartment.  Other: None  IMPRESSION: 1. Extensive horizontal tear of the posterior horn of the medial meniscus. 2. Extensive horizontal tear of the anterior horn and midbody of the lateral meniscus. 3. Moderate osteoarthritis of the medial compartment with full-thickness cartilage loss and subcortical edema and subcortical cyst formation in the medial tibial plateau.   Electronically Signed By: Lorriane Shire M.D. On: 09/01/2017 10:59 \    Site Confirmation: Pamela Romero was asked to confirm the procedure and laterality before marking the site.  Consent: Before the procedure and under the influence of no sedative(s), amnesic(s), or anxiolytics, the patient was informed of the treatment options, risks and possible complications. To fulfill our ethical and legal obligations, as recommended by the American Medical Association's Code of Ethics, I have informed the patient of  my clinical impression; the nature and purpose of the treatment or procedure; the risks, benefits, and possible complications of the intervention; the alternatives, including doing nothing; the risk(s) and benefit(s) of  the alternative treatment(s) or procedure(s); and the risk(s) and benefit(s) of doing nothing. The patient was provided information about the general risks and possible complications associated with the procedure. These may include, but are not limited to: failure to achieve desired goals, infection, bleeding, organ or nerve damage, allergic reactions, paralysis, and death. In addition, the patient was informed of those risks and complications associated to Spine-related procedures, such as failure to decrease pain; infection (i.e.: Meningitis, epidural or intraspinal abscess); bleeding (i.e.: epidural hematoma, subarachnoid hemorrhage, or any other type of intraspinal or peri-dural bleeding); organ or nerve damage (i.e.: Any type of peripheral nerve, nerve root, or spinal cord injury) with subsequent damage to sensory, motor, and/or autonomic systems, resulting in permanent pain, numbness, and/or weakness of one or several areas of the body; allergic reactions; (i.e.: anaphylactic reaction); and/or death. Furthermore, the patient was informed of those risks and complications associated with the medications. These include, but are not limited to: allergic reactions (i.e.: anaphylactic or anaphylactoid reaction(s)); adrenal axis suppression; blood sugar elevation that in diabetics may result in ketoacidosis or comma; water retention that in patients with history of congestive heart failure may result in shortness of breath, pulmonary edema, and decompensation with resultant heart failure; weight gain; swelling or edema; medication-induced neural toxicity; particulate matter embolism and blood vessel occlusion with resultant organ, and/or nervous system infarction; and/or aseptic necrosis of one or  more joints. Finally, the patient was informed that Medicine is not an exact science; therefore, there is also the possibility of unforeseen or unpredictable risks and/or possible complications that may result in a catastrophic outcome. The patient indicated having understood very clearly. We have given the patient no guarantees and we have made no promises. Enough time was given to the patient to ask questions, all of which were answered to the patient's satisfaction. Pamela Romero has indicated that she wanted to continue with the procedure. Attestation: I, the ordering provider, attest that I have discussed with the patient the benefits, risks, side-effects, alternatives, likelihood of achieving goals, and potential problems during recovery for the procedure that I have provided informed consent.  Date  Time: 02/24/2021 10:34 AM   Prophylactic antibiotics  Anti-infectives (From admission, onward)    None      Indication(s): None identified   Description of procedure   Start Time: 1110 hrs  Local Anesthesia: Once the patient was positioned, prepped, and time-out was completed. The target area was identified located. The skin was marked with an approved surgical skin marker. Once marked, the skin (epidermis, dermis, and hypodermis), and deeper tissues (fat, connective tissue and muscle) were infiltrated with a small amount of a short-acting local anesthetic, loaded on a 10cc syringe with a 25G, 1.5-in  Needle. An appropriate amount of time was allowed for local anesthetics to take effect before proceeding to the next step. Local Anesthetic: Lidocaine 1-2% The unused portion of the local anesthetic was discarded in the proper designated containers. Safety Precautions: Aspiration looking for blood return was conducted prior to all injections. At no point did I inject any substances, as a needle was being advanced. Before injecting, the patient was told to immediately notify me if she was  experiencing any new onset of "ringing in the ears, or metallic taste in the mouth". No attempts were made at seeking any paresthesias. Safe injection practices and needle disposal techniques used. Medications properly checked for expiration dates. SDV (single dose vial) medications used. After the completion of the procedure, all disposable equipment used was  discarded in the proper designated medical waste containers.  Technical description: Protocol guidelines were followed. After positioning, the target area was identified and prepped in the usual manner. Skin & deeper tissues infiltrated with local anesthetic. Appropriate amount of time allowed to pass for local anesthetics to take effect. Proper needle placement secured. Once satisfactory needle placement was confirmed, I proceeded to inject the desired solution in slow, incremental fashion, intermittently assessing for discomfort or any signs of abnormal or undesired spread of substance. Once completed, the needle was removed and disposed of, as per hospital protocols. The area was cleaned, making sure to leave some of the prepping solution back to take advantage of its long term bactericidal properties.  Aspiration:  Negative   Vitals:   02/24/21 1039 02/24/21 1044  BP: (!) 116/91 116/77  Pulse: 89 88  Resp: 16 16  Temp: (!) 96.8 F (36 C)   TempSrc: Temporal   SpO2: 100% 100%  Weight: 200 lb (90.7 kg)   Height: 5\' 9"  (1.753 m)      End Time: 1115 hrs     Post-op assessment  Post-procedure Vital Signs:  Pulse/HCG Rate: 88  Temp: (!) 96.8 F (36 C)  Resp: 16 BP: 116/77 SpO2: 100 %  EBL: None  Complications: No immediate post-treatment complications observed by team, or reported by patient.  Note: The patient tolerated the entire procedure well. A repeat set of vitals were taken after the procedure and the patient was kept under observation following institutional policy, for this type of procedure. Post-procedural  neurological assessment was performed, showing return to baseline, prior to discharge. The patient was provided with post-procedure discharge instructions, including a section on how to identify potential problems. Should any problems arise concerning this procedure, the patient was given instructions to immediately contact us, at any time, without hesitation. In any case, we plan to contact the patient by telephone for a follow-up status report regarding this interventional procedure.  Comments:  No additional relevant information.   Plan of care  Chronic Opioid Analgesic:  Hydrocodone 7.5 mg 3 times daily as needed with an extra quantity 15/month (#105)For severe breakthrough pain    Medications administered: We administered lidocaine, Triamcinolone Acetonide, and Triamcinolone Acetonide.  5 cc of triamcinolone injected in each knee.  Follow-up plan:   Return for Keep sch. appt.     Recent Visits Date Type Provider Dept  02/09/21 Office Visit Gillis Santa, MD [facility]  12/03/20 Office Visit Gillis Santa, MD [facility]  Showing recent visits within past 90 days and meeting all other requirements Today's Visits Date Type Provider Dept  02/24/21 Procedure visit Gillis Santa, MD [facility]  Showing today's visits and meeting all other requirements Future Appointments Date Type Provider Dept  05/06/21 Appointment Gillis Santa, MD [facility]  Showing future appointments within next 90 days and meeting all other requirements  Disposition: Discharge home  Discharge (Date  Time): 02/24/2021; 1115 hrs.   Primary Care Physician: [name], FNP Location: [facility] Note by: Gillis Santa, MD Date: 02/24/2021; Time: 11:43 AM  DISCLAIMER: Medicine is not an exact science. It has no guarantees or warranties. The decision to proceed with this intervention was based on the information collected from the  patient. Conclusions were drawn from the patient's questionnaire, interview, and examination. Because information was provided in large part by the patient, it cannot be guaranteed that it has not been purposely or unconsciously manipulated or altered. Every  effort has been made to obtain as much accurate, relevant, available data as possible. Always take into account that the treatment will also be dependent on availability of resources and existing treatment guidelines, considered by other Pain Management Specialists as being common knowledge and practice, at the time of the intervention. It is also important to point out that variation in procedural techniques and pharmacological choices are the acceptable norm. For Medico-Legal review purposes, the indications, contraindications, technique, and results of the these procedures should only be evaluated, judged and interpreted by a Board-Certified Interventional Pain Specialist with extensive familiarity and expertise in the same exact procedure and technique.

## 2021-02-24 NOTE — Progress Notes (Signed)
Safety precautions to be maintained throughout the outpatient stay will include: orient to surroundings, keep bed in low position, maintain call bell within reach at all times, provide assistance with transfer out of bed and ambulation.  

## 2021-02-25 ENCOUNTER — Telehealth: Payer: Self-pay | Admitting: *Deleted

## 2021-02-25 NOTE — Telephone Encounter (Signed)
Denies any post procedure issues. 

## 2021-03-16 ENCOUNTER — Ambulatory Visit (INDEPENDENT_AMBULATORY_CARE_PROVIDER_SITE_OTHER): Payer: Medicare HMO | Admitting: Internal Medicine

## 2021-03-16 ENCOUNTER — Other Ambulatory Visit: Payer: Self-pay

## 2021-03-16 ENCOUNTER — Encounter: Payer: Self-pay | Admitting: Internal Medicine

## 2021-03-16 VITALS — BP 118/76 | HR 73 | Temp 97.5°F | Resp 18 | Ht 69.0 in | Wt 210.2 lb

## 2021-03-16 DIAGNOSIS — J441 Chronic obstructive pulmonary disease with (acute) exacerbation: Secondary | ICD-10-CM

## 2021-03-16 DIAGNOSIS — R5382 Chronic fatigue, unspecified: Secondary | ICD-10-CM

## 2021-03-16 DIAGNOSIS — E890 Postprocedural hypothyroidism: Secondary | ICD-10-CM

## 2021-03-16 DIAGNOSIS — K5903 Drug induced constipation: Secondary | ICD-10-CM

## 2021-03-16 DIAGNOSIS — Z6831 Body mass index (BMI) 31.0-31.9, adult: Secondary | ICD-10-CM

## 2021-03-16 DIAGNOSIS — E538 Deficiency of other specified B group vitamins: Secondary | ICD-10-CM | POA: Diagnosis not present

## 2021-03-16 DIAGNOSIS — E559 Vitamin D deficiency, unspecified: Secondary | ICD-10-CM

## 2021-03-16 DIAGNOSIS — F411 Generalized anxiety disorder: Secondary | ICD-10-CM

## 2021-03-16 DIAGNOSIS — D692 Other nonthrombocytopenic purpura: Secondary | ICD-10-CM | POA: Diagnosis not present

## 2021-03-16 DIAGNOSIS — F5104 Psychophysiologic insomnia: Secondary | ICD-10-CM

## 2021-03-16 DIAGNOSIS — G894 Chronic pain syndrome: Secondary | ICD-10-CM

## 2021-03-16 DIAGNOSIS — M170 Bilateral primary osteoarthritis of knee: Secondary | ICD-10-CM

## 2021-03-16 DIAGNOSIS — I10 Essential (primary) hypertension: Secondary | ICD-10-CM

## 2021-03-16 DIAGNOSIS — F330 Major depressive disorder, recurrent, mild: Secondary | ICD-10-CM

## 2021-03-16 DIAGNOSIS — Z23 Encounter for immunization: Secondary | ICD-10-CM

## 2021-03-16 DIAGNOSIS — T402X5A Adverse effect of other opioids, initial encounter: Secondary | ICD-10-CM

## 2021-03-16 DIAGNOSIS — K2100 Gastro-esophageal reflux disease with esophagitis, without bleeding: Secondary | ICD-10-CM

## 2021-03-16 DIAGNOSIS — M810 Age-related osteoporosis without current pathological fracture: Secondary | ICD-10-CM

## 2021-03-16 DIAGNOSIS — E6609 Other obesity due to excess calories: Secondary | ICD-10-CM

## 2021-03-16 DIAGNOSIS — G4700 Insomnia, unspecified: Secondary | ICD-10-CM | POA: Insufficient documentation

## 2021-03-16 DIAGNOSIS — N1831 Chronic kidney disease, stage 3a: Secondary | ICD-10-CM

## 2021-03-16 DIAGNOSIS — E785 Hyperlipidemia, unspecified: Secondary | ICD-10-CM

## 2021-03-16 DIAGNOSIS — G43C1 Periodic headache syndromes in child or adult, intractable: Secondary | ICD-10-CM

## 2021-03-16 LAB — CBC
HCT: 34.8 % — ABNORMAL LOW (ref 35.0–45.0)
Hemoglobin: 11.6 g/dL — ABNORMAL LOW (ref 11.7–15.5)
MCH: 32.5 pg (ref 27.0–33.0)
MCHC: 33.3 g/dL (ref 32.0–36.0)
MCV: 97.5 fL (ref 80.0–100.0)
MPV: 10.3 fL (ref 7.5–12.5)
Platelets: 135 10*3/uL — ABNORMAL LOW (ref 140–400)
RBC: 3.57 10*6/uL — ABNORMAL LOW (ref 3.80–5.10)
RDW: 12.4 % (ref 11.0–15.0)
WBC: 6.6 10*3/uL (ref 3.8–10.8)

## 2021-03-16 LAB — TSH: TSH: 0.34 mIU/L — ABNORMAL LOW (ref 0.40–4.50)

## 2021-03-16 LAB — VITAMIN D 25 HYDROXY (VIT D DEFICIENCY, FRACTURES): Vit D, 25-Hydroxy: 14 ng/mL — ABNORMAL LOW (ref 30–100)

## 2021-03-16 LAB — VITAMIN B12: Vitamin B-12: 212 pg/mL (ref 200–1100)

## 2021-03-16 LAB — T4, FREE: Free T4: 1.2 ng/dL (ref 0.8–1.8)

## 2021-03-16 NOTE — Assessment & Plan Note (Signed)
We will check c-Met at next visit

## 2021-03-16 NOTE — Assessment & Plan Note (Signed)
CBC today.  

## 2021-03-16 NOTE — Assessment & Plan Note (Signed)
Encourage diet and exercise for weight loss 

## 2021-03-16 NOTE — Assessment & Plan Note (Signed)
She will continue Hydrocodone and Gabapentin prescribed by pain management

## 2021-03-16 NOTE — Assessment & Plan Note (Signed)
Diet controlled.  

## 2021-03-16 NOTE — Assessment & Plan Note (Signed)
She is not interested in medication therapy at this time Support offered

## 2021-03-16 NOTE — Assessment & Plan Note (Signed)
Asymptomatic Continue Albuterol as needed

## 2021-03-16 NOTE — Assessment & Plan Note (Signed)
She is not taking any sleep aids at this time Will monitor

## 2021-03-16 NOTE — Assessment & Plan Note (Signed)
Encourage stress reducing techniques Likely exacerbated by not sleeping well Continue Excedrin Migraine as needed

## 2021-03-16 NOTE — Assessment & Plan Note (Signed)
TSH and free T4 today Will adjust Levothyroxine if needed based on labs 

## 2021-03-16 NOTE — Assessment & Plan Note (Signed)
Encouraged her to consume a low fat diet  

## 2021-03-16 NOTE — Assessment & Plan Note (Signed)
Controlled on Lisinopril HCT Reinforced DASH diet and exercise for weight loss C-Met today

## 2021-03-16 NOTE — Assessment & Plan Note (Signed)
Try to avoid foods that trigger reflux Encourage weight loss as this can help reduce reflux symptoms Continue Esomeprazole

## 2021-03-16 NOTE — Progress Notes (Signed)
Subjective:    Patient ID: Pamela Romero, female    DOB: 1950/07/12, 70 y.o.   MRN: [id number]  HPI  Patient presents to clinic today for follow-up of chronic conditions.  She is establishing care with me today, transferring care from Lonie Peak, NP.  HTN: Her BP today is 118/76.  She is taking Lisinopril HCT as prescribed.  ECG from 05/2010 reviewed.  GERD: Triggered by anti-inflammatory use.  She denies breakthrough on Esomeprazole.  Upper GI from 03/2020 reviewed.  COPD: She denies chronic cough or shortness of breath.  She uses Albuterol as needed with good relief of symptoms.  There are no PFTs on file. She is a former smoker.  Migraines: Triggered by stress.  These occur 4-5 x per week.  She takes Excedrin Migraines as needed with good relief of symptoms.  She does not follow with neurology.  Anxiety and Depression: In remission.  She is not taking any medications for this at this time.  She is not seeing a therapist.  She denies SI/HI.  Chronic Pain Syndrome/OA: Generalized.  Managed with Hydrocodone and Gabapentin as prescribed by pain management.  Hypothyroidism: She denies any issues on her current dose of Levothyroxine.  She does not follow with endocrinology.  HLD: Last LDL was 119, triglycerides 132, 08/2019.  She is not taking any cholesterol-lowering medication at this time.  She tries to consume a low-fat diet  History of Endometrial Cancer: s/p hysterectomy, and radiation. She no longer follows with GYN Onc.  Insomnia: She has difficulty staying asleep. She does feel tired during the day. She takes Gabapentin with some relief of symptoms. Sleep study from 12/2019 reviewed.  Osteoporosis: She is not taking any bisphosphonate at this time.  She tries to get some weightbearing exercise daily.  Review of Systems     Past Medical History:  Diagnosis Date   Abnormal EKG    CAD (coronary artery disease)    non critical, by cardiac cath 2006   Cancer [facility])     endometrial Ca   COPD (chronic obstructive pulmonary disease) ([place])    Endometrial adenocarcinoma ([place]) 2011   Hyperlipidemia    Hypertension    Osteoarthritis    Right foot, Left knee (Dr. Roland Rack)   Reflux gastritis    Thyroid disease    tobacco abuse    Vaginal delivery    x 2    Current Outpatient Medications  Medication Sig Dispense Refill   albuterol (PROAIR HFA) 108 (90 Base) MCG/ACT inhaler Inhale 1-2 puffs into the lungs every 6 (six) hours as needed for wheezing or shortness of breath. 6.7 g 1   esomeprazole (NEXIUM) 40 MG capsule Take 40 mg by mouth daily.     gabapentin (NEURONTIN) 300 MG capsule Take 1-2 capsules (300-600 mg total) by mouth at bedtime. 180 capsule 0   HYDROcodone-acetaminophen (NORCO) 7.5-325 MG tablet Take 1 tablet by mouth every 6 (six) hours as needed for severe pain. Must last 30 days. 105 tablet 0   [START ON 04/12/2021] HYDROcodone-acetaminophen (NORCO) 7.5-325 MG tablet Take 1 tablet by mouth every 6 (six) hours as needed for severe pain. Must last 30 days. 105 tablet 0   levothyroxine (SYNTHROID) 137 MCG tablet TAKE 1 TABLET (137 MCG TOTAL) BY MOUTH DAILY BEFORE BREAKFAST. 90 tablet 0   lisinopril-hydrochlorothiazide (ZESTORETIC) 20-12.5 MG tablet Take 1 tablet by mouth daily. 90 tablet 3   omeprazole (PRILOSEC) 20 MG capsule TAKE 2 CAPSULES BY MOUTH EVERY DAY 180 capsule 0  No current facility-administered medications for this visit.    No Known Allergies  Family History  Problem Relation Age of Onset   Alcohol abuse Father    Cancer Maternal Aunt        stomach   Alcohol abuse Maternal Aunt    Alcohol abuse Mother    Heart disease Brother        MI age 2- non smoker   Cancer Brother        Pancreatic   Cancer Brother        colon, dx'd age 46   Cancer Brother        stomach, paternal half brother   Breast cancer Neg Hx     Social History   Socioeconomic History   Marital status: Married    Spouse name: Not on file   Number of  children: 0   Years of education: Not on file   Highest education level: Not on file  Occupational History   Occupation: retired   Tobacco Use   Smoking status: Former    Packs/day: 0.50    Years: 50.00    Pack years: 25.00    Types: Cigarettes    Quit date: 05/11/2018    Years since quitting: 2.8   Smokeless tobacco: Former  Scientific laboratory technician Use: Never used  Substance and Sexual Activity   Alcohol use: No   Drug use: No   Sexual activity: Not on file  Other Topics Concern   Not on file  Social History Narrative   Lives with spouse, takes care of her mother.   Had 2 children, one died at birth, the other given up for adoption (was unmarried).   Social Determinants of Health   Financial Resource Strain: Not on file  Food Insecurity: Not on file  Transportation Needs: Not on file  Physical Activity: Not on file  Stress: Not on file  Social Connections: Not on file  Intimate Partner Violence: Not on file     Constitutional: Patient reports intermittent headaches, fatigue.  Denies fever, malaise, or abrupt weight changes.  HEENT: Denies eye pain, eye redness, ear pain, ringing in the ears, wax buildup, runny nose, nasal congestion, bloody nose, or sore throat. Respiratory: Denies difficulty breathing, shortness of breath, cough or sputum production.   Cardiovascular: Denies chest pain, chest tightness, palpitations or swelling in the hands or feet.  Gastrointestinal: Denies abdominal pain, bloating, constipation, diarrhea or blood in the stool.  GU: Denies urgency, frequency, pain with urination, burning sensation, blood in urine, odor or discharge. Musculoskeletal: Patient reports chronic muscle and joint pain.  Denies decrease in range of motion, difficulty with gait, or joint swelling.  Skin: Denies redness, rashes, lesions or ulcercations.  Neurological: Patient reports insomnia.  Denies dizziness, difficulty with memory, difficulty with speech or problems with balance  and coordination.  Psych: Patient has a history of anxiety and depression.  Denies SI/HI.  No other specific complaints in a complete review of systems (except as listed in HPI above).  Objective:   Physical Exam  BP 118/76 (BP Location: Right Arm, Patient Position: Sitting, Cuff Size: Large)   Pulse 73   Temp (!) 97.5 F (36.4 C) (Temporal)   Resp 18   Ht 5\' 9"  (1.753 m)   Wt 210 lb 3.2 oz (95.3 kg)   SpO2 100%   BMI 31.04 kg/m   Wt Readings from Last 3 Encounters:  02/24/21 200 lb (90.7 kg)  02/09/21 200 lb (90.7 kg)  12/03/20 200 lb (90.7 kg)    General: Appears her stated age, obese, in NAD. Skin: Warm, dry and intact.  Her breath noted. HEENT: Head: normal shape and size; Eyes: sclera white, no icterus and EOMs intact;  Neck:  Neck supple, trachea midline. No masses, lumps or thyromegaly present.  Cardiovascular: Normal rate and rhythm. S1,S2 noted.  No murmur, rubs or gallops noted. No JVD or BLE edema. Pulmonary/Chest: Normal effort and positive vesicular breath sounds. No respiratory distress. No wheezes, rales or ronchi noted.  Abdomen: Soft and nontender. Normal bowel sounds.  Musculoskeletal:No difficulty with gait.  Neurological: Alert and oriented.  Psychiatric: Mood and affect mildly flat. Behavior is normal. Judgment and thought content normal.    BMET    Component Value Date/Time   NA 140 04/07/2020 1022   NA 141 08/29/2017 0950   K 3.9 04/07/2020 1022   CL 104 04/07/2020 1022   CO2 28 04/07/2020 1022   GLUCOSE 88 04/07/2020 1022   BUN 15 04/07/2020 1022   BUN 19 08/29/2017 0950   CREATININE 0.82 04/07/2020 1022   CREATININE 1.09 (H) 01/02/2020 1453   CALCIUM 9.8 04/07/2020 1022   GFRNONAA >60 04/07/2020 1022   GFRNONAA 52 (L) 01/02/2020 1453   GFRAA 61 01/02/2020 1453    Lipid Panel     Component Value Date/Time   CHOL 186 08/30/2019 1131   TRIG 132 08/30/2019 1131   HDL 43 (L) 08/30/2019 1131   CHOLHDL 4.3 08/30/2019 1131   VLDL 42.0  (H) 05/14/2014 0918   LDLCALC 118 (H) 08/30/2019 1131    CBC    Component Value Date/Time   WBC 6.9 04/07/2020 1022   RBC 4.03 04/07/2020 1022   HGB 12.9 04/07/2020 1022   HCT 37.6 04/07/2020 1022   PLT 149 (L) 04/07/2020 1022   MCV 93.3 04/07/2020 1022   MCH 32.0 04/07/2020 1022   MCHC 34.3 04/07/2020 1022   RDW 12.4 04/07/2020 1022   LYMPHSABS 1,270 01/02/2020 1453   MONOABS 0.4 09/16/2013 1046   EOSABS 41 01/02/2020 1453   BASOSABS 31 01/02/2020 1453    Hgb A1C Lab Results  Component Value Date   HGBA1C 5.1 11/06/2018            Assessment & Plan:   Fatigue, Vitamin D Deficiency, Vitamin B12 Deficiency:  Sleep study reviewed Could be stress/depression related We will check CBC, TSH, free T4, vitamin D and B12 today Encourage regular physical activity  We will follow-up after labs with further recommendations treatment plan, RTC in 6 months for annual exam   [name], NP This visit occurred during the SARS-CoV-2 public health emergency.  Safety protocols were in place, including screening questions prior to the visit, additional usage of staff PPE, and extensive cleaning of exam room while observing appropriate contact time as indicated for disinfecting solutions.

## 2021-03-16 NOTE — Assessment & Plan Note (Signed)
Encourage daily weightbearing exercise 

## 2021-03-16 NOTE — Patient Instructions (Signed)

## 2021-03-18 MED ORDER — VITAMIN D (ERGOCALCIFEROL) 1.25 MG (50000 UNIT) PO CAPS: 50000.0000 [IU] | ORAL_CAPSULE | ORAL | 0 refills | Status: DC

## 2021-03-18 NOTE — Addendum Note (Signed)
Addended by: Jearld Fenton on: 03/18/2021 01:16 PM   Modules accepted: Orders

## 2021-03-19 ENCOUNTER — Telehealth: Payer: Self-pay

## 2021-03-19 NOTE — Telephone Encounter (Signed)
Copied from [employer] [id number]. Topic: General - Other >> Mar 18, 2021  2:23 PM Tessa Lerner A wrote: Reason for CRM: The patient would like to be contacted by a member of clinical staff to discuss their recent lab results from 03/16/21  The patient has additional questions about vaccinations as well  Please contact further when possible

## 2021-03-19 NOTE — Telephone Encounter (Signed)
Attempted to contact the patient, no answer.

## 2021-04-24 ENCOUNTER — Other Ambulatory Visit: Payer: Self-pay | Admitting: Student in an Organized Health Care Education/Training Program

## 2021-05-06 ENCOUNTER — Encounter: Payer: Self-pay | Admitting: Student in an Organized Health Care Education/Training Program

## 2021-05-06 ENCOUNTER — Ambulatory Visit
Payer: Medicare HMO | Attending: Student in an Organized Health Care Education/Training Program | Admitting: Student in an Organized Health Care Education/Training Program

## 2021-05-06 ENCOUNTER — Other Ambulatory Visit: Payer: Self-pay

## 2021-05-06 VITALS — BP 174/119 | HR 87 | Temp 97.4°F | Resp 16 | Ht 68.5 in | Wt 200.0 lb

## 2021-05-06 DIAGNOSIS — M170 Bilateral primary osteoarthritis of knee: Secondary | ICD-10-CM | POA: Insufficient documentation

## 2021-05-06 DIAGNOSIS — M25571 Pain in right ankle and joints of right foot: Secondary | ICD-10-CM | POA: Insufficient documentation

## 2021-05-06 DIAGNOSIS — G8929 Other chronic pain: Secondary | ICD-10-CM | POA: Diagnosis not present

## 2021-05-06 DIAGNOSIS — M722 Plantar fascial fibromatosis: Secondary | ICD-10-CM | POA: Diagnosis not present

## 2021-05-06 DIAGNOSIS — G43709 Chronic migraine without aura, not intractable, without status migrainosus: Secondary | ICD-10-CM | POA: Diagnosis not present

## 2021-05-06 DIAGNOSIS — M1712 Unilateral primary osteoarthritis, left knee: Secondary | ICD-10-CM | POA: Insufficient documentation

## 2021-05-06 DIAGNOSIS — G894 Chronic pain syndrome: Secondary | ICD-10-CM | POA: Diagnosis not present

## 2021-05-06 DIAGNOSIS — M5416 Radiculopathy, lumbar region: Secondary | ICD-10-CM | POA: Diagnosis not present

## 2021-05-06 DIAGNOSIS — Z79891 Long term (current) use of opiate analgesic: Secondary | ICD-10-CM | POA: Diagnosis not present

## 2021-05-06 MED ORDER — UBRELVY 50 MG PO TABS: ORAL_TABLET | ORAL | 2 refills | Status: DC

## 2021-05-06 MED ORDER — HYDROCODONE-ACETAMINOPHEN 7.5-325 MG PO TABS: 1.0000 | ORAL_TABLET | Freq: Four times a day (QID) | ORAL | 0 refills | Status: DC | PRN

## 2021-05-06 MED ORDER — HYDROCODONE-ACETAMINOPHEN 7.5-325 MG PO TABS: 1.0000 | ORAL_TABLET | Freq: Four times a day (QID) | ORAL | 0 refills | Status: AC | PRN

## 2021-05-06 MED ORDER — GABAPENTIN 300 MG PO CAPS: 300.0000 mg | ORAL_CAPSULE | Freq: Every day | ORAL | 2 refills | Status: DC

## 2021-05-06 NOTE — Progress Notes (Signed)
Nursing Pain Medication Assessment:  Safety precautions to be maintained throughout the outpatient stay will include: orient to surroundings, keep bed in low position, maintain call bell within reach at all times, provide assistance with transfer out of bed and ambulation.  Medication Inspection Compliance: Pill count conducted under aseptic conditions, in front of the patient. Neither the pills nor the bottle was removed from the patient's sight at any time. Once count was completed pills were immediately returned to the patient in their original bottle.  Medication: Hydrocodone/APAP Pill/Patch Count:  26 of 105 pills remain Pill/Patch Appearance: Markings consistent with prescribed medication Bottle Appearance: Standard pharmacy container. Clearly labeled. Filled Date: xx / xx /  xx Last Medication intake:  Today  Pt states she inadvertantly ripped label off container which now precludes writer from seeing fill date.

## 2021-05-06 NOTE — Progress Notes (Signed)
PROVIDER NOTE: Information contained herein reflects review and annotations entered in association with encounter. Interpretation of such information and data should be left to medically-trained personnel. Information provided to patient can be located elsewhere in the medical record under "Patient Instructions". Document created using STT-dictation technology, any transcriptional errors that may result from process are unintentional.    Patient: Pamela Romero  Service Category: E/M  Provider: Gillis Santa, MD  DOB: 08-28-1950  DOS: 05/06/2021  Specialty: Interventional Pain Management  MRN: [id number]  Setting: Ambulatory outpatient  PCP: [name], FNP  Type: Established Patient    Referring Provider: [name], FNP  Location: Office  Delivery: Face-to-face     HPI  [name], a 71 y.o. year old female, is here today because of her Chronic pain syndrome [G89.4]. [name] primary complain today is Headache (Left side)  Last encounter: My last encounter with her was on 02/24/21  Pertinent problems: [name] has Major depressive disorder, recurrent, mild (Pamela Romero) and Osteoporosis on their pertinent problem list. Pain Assessment: Severity of Chronic pain is reported as a 7 /10. Location: Head Left, Anterior/from left eye behind left/low ear. Onset: More than a month ago. Quality: Throbbing. Timing: Intermittent. Modifying factor(s): "excedrin migraine sometimes helps". Vitals:  height is 5' 8.5" (1.74 m) and weight is 200 lb (90.7 kg). Her temporal temperature is 97.4 F (36.3 C) (abnormal). Her blood pressure is 174/119 (abnormal) and her pulse is 87. Her respiration is 16 and oxygen saturation is 98%.   Reason for encounter:  MM and PP eval  -Patient is endorsing improvement in her bilateral knee pain after her long-acting triamcinolone injection.  She states that this injection has been more effective for her knee pain than any other treatment.  She is able to walk  more comfortably and bear weight.  She states that she is functioning better as well as a result.  She is very pleased with results.  She is having increased headaches.  They have been 4-5 times a month.  She is tried Excedrin, NSAIDs, acetaminophen, Cymbalta in the past with limited response.  Recommend Roselyn Meier as below as an abortive medication.  Follow-up as needed for repeat intra-articular knee triamcinolone long-acting injection.  Post-procedure evaluation     Procedure: Steroid Knee Injection (LONG ACTING Kennalog) #2 Laterality: Bilateral (-50) Level: Intra-articular  No.: R1  L2 Series: n/a Purpose: Therapeutic Indications: Knee arthralgia associated to osteoarthritis of the knee   Imaging: None required (CPT-20610) Analgesia: Skin infiltration w/ local anesthetics Sedation: None  NAS-11 score:   Pre-procedure: 7 /10   Post-procedure: 1 /10       Effectiveness:  Initial hour after procedure: 100 %  Subsequent 4-6 hours post-procedure: 100 %  Analgesia past initial 6 hours: 100 % (X 2 weeks; currently remains at 75% improvement)  Ongoing improvement:  Analgesic:  75% Function: [name] reports improvement in function ROM: [name] reports improvement in ROM    Pharmacotherapy Assessment  Analgesic: Hydrocodone 7.5 mg 3 times daily as needed with an extra quantity 15/month (#105)For severe breakthrough pain    Monitoring: [place] PMP: PDMP reviewed during this encounter.       Pharmacotherapy: No side-effects or adverse reactions reported. Compliance: No problems identified. Effectiveness: Clinically acceptable.  UDS:  Summary  Date Value Ref Range Status  11/04/2020 Note  Final    Comment:    ==================================================================== [employer] [facility] (MW) ==================================================================== Test  Result       Flag       Units  Drug Present and Declared for  Prescription Verification   Hydrocodone                    982          EXPECTED   ng/mg creat   Hydromorphone                  186          EXPECTED   ng/mg creat   Dihydrocodeine                 211          EXPECTED   ng/mg creat   Norhydrocodone                 1188         EXPECTED   ng/mg creat    Sources of hydrocodone include scheduled prescription medications.    Hydromorphone, dihydrocodeine and norhydrocodone are expected    metabolites of hydrocodone. Hydromorphone and dihydrocodeine are    also available as scheduled prescription medications.  ==================================================================== Test                      Result    Flag   Units      Ref Range   Creatinine              66               mg/dL      >=20 ==================================================================== Declared Medications:  The flagging and interpretation on this report are based on the  following declared medications.  Unexpected results may arise from  inaccuracies in the declared medications.   **Note: The testing scope of this panel includes these medications:   Hydrocodone (Norco)   **Note: The testing scope of this panel does not include the  following reported medications:   Acetaminophen (Norco)  Albuterol (Ventolin HFA)  Esomeprazole (Nexium)  Gabapentin (Neurontin)  Hydrochlorothiazide (Zestoretic)  Levothyroxine (Synthroid)  Lisinopril (Zestoretic)  Omeprazole (Prilosec) ==================================================================== For clinical consultation, please call [phone]. ====================================================================       ROS  Constitutional: Denies any fever or chills Gastrointestinal: No reported hemesis, hematochezia, vomiting, or acute GI distress Musculoskeletal:  Left knee pain right knee pain as well, improved after long-acting steroid injection Neurological: No reported episodes of acute onset  apraxia, aphasia, dysarthria, agnosia, amnesia, paralysis, loss of coordination, or loss of consciousness  Medication Review  HYDROcodone-acetaminophen, Ubrogepant, Vitamin D (Ergocalciferol), albuterol, esomeprazole, gabapentin, levothyroxine, and lisinopril-hydrochlorothiazide  History Review  Allergy: [name] has No Known Allergies. Drug: [name]  reports no history of drug use. Alcohol:  reports no history of alcohol use. Tobacco:  reports that she quit smoking about 2 years ago. Her smoking use included cigarettes. She has a 25.00 pack-year smoking history. She has quit using smokeless tobacco. Social: [name]  reports that she quit smoking about 2 years ago. Her smoking use included cigarettes. She has a 25.00 pack-year smoking history. She has quit using smokeless tobacco. She reports that she does not drink alcohol and does not use drugs. Medical:  has a past medical history of Abnormal EKG, CAD (coronary artery disease), Cancer ([place]), COPD (chronic obstructive pulmonary disease) ([place]), Endometrial adenocarcinoma ([place]) (2011), Hyperlipidemia, Hypertension, Osteoarthritis, Reflux gastritis, Thyroid disease, tobacco abuse, and Vaginal delivery. Surgical: [name]  has a past surgical  history that includes Thyroidectomy (2005); Cardiac catheterization (2006); Total abdominal hysterectomy; Breast surgery (Right, March 2014); Breast cyst aspiration (Right, 2014); and Esophagogastroduodenoscopy (N/A, 04/07/2020). Family: family history includes Alcohol abuse in her father, maternal aunt, and mother; Cancer in her brother, brother, brother, and maternal aunt; Heart disease in her brother.  Laboratory Chemistry Profile   Renal Lab Results  Component Value Date   BUN 15 04/07/2020   CREATININE 0.82 04/07/2020   BCR 22 01/02/2020   GFR 60.34 01/21/2015   GFRAA 61 01/02/2020   GFRNONAA >60 04/07/2020    Hepatic Lab Results  Component Value Date   AST 14 01/02/2020   ALT  7 01/02/2020   ALBUMIN 4.4 08/29/2017   ALKPHOS 126 (H) 08/29/2017   HCVAB NEGATIVE 01/21/2015    Electrolytes Lab Results  Component Value Date   NA 140 04/07/2020   K 3.9 04/07/2020   CL 104 04/07/2020   CALCIUM 9.8 04/07/2020   MG 1.9 06/04/2018    Bone Lab Results  Component Value Date   VD25OH 14 (L) 03/16/2021    Inflammation (CRP: Acute Phase) (ESR: Chronic Phase) Lab Results  Component Value Date   ESRSEDRATE 19 03/30/2012         Note: Above Lab results reviewed.   Physical Exam  General appearance: Well nourished, well developed, and well hydrated. In no apparent acute distress  She has chest Mental status: Alert, oriented x 3 (person, place, & time)       Respiratory: No evidence of acute respiratory distress Eyes: PERLA Vitals: BP (!) 174/119    Pulse 87    Temp (!) 97.4 F (36.3 C) (Temporal)    Resp 16    Ht 5' 8.5" (1.74 m)    Wt 200 lb (90.7 kg)    SpO2 98%    BMI 29.97 kg/m  BMI: Estimated body mass index is 29.97 kg/m as calculated from the following:   Height as of this encounter: 5' 8.5" (1.74 m).   Weight as of this encounter: 200 lb (90.7 kg). Ideal: Ideal body weight: 65 kg (143 lb 6.5 oz) Adjusted ideal body weight: 75.3 kg (166 lb 0.7 oz)  Limited range of motion of left knee, pain with weightbearing, improved after the injection Arthropathic pain pattern of left knee pain right knee, improved  Assessment   Diagnosis  1. Chronic pain syndrome   2. Primary osteoarthritis of left knee   3. Bilateral primary osteoarthritis of knee   4. Chronic radicular lumbar pain (left)   5. Chronic use of opiate for therapeutic purpose   6. Chronic pain of right ankle   7. Plantar fascia syndrome   8. Chronic migraine without aura without status migrainosus, not intractable        Plan of Care   Requested Prescriptions   Signed Prescriptions Disp Refills   HYDROcodone-acetaminophen (NORCO) 7.5-325 MG tablet 105 tablet 0    Sig: Take 1  tablet by mouth every 6 (six) hours as needed for severe pain. Must last 30 days.   HYDROcodone-acetaminophen (NORCO) 7.5-325 MG tablet 105 tablet 0    Sig: Take 1 tablet by mouth every 6 (six) hours as needed for severe pain. Must last 30 days.   HYDROcodone-acetaminophen (NORCO) 7.5-325 MG tablet 105 tablet 0    Sig: Take 1 tablet by mouth every 6 (six) hours as needed for severe pain. Must last 30 days.   gabapentin (NEURONTIN) 300 MG capsule 180 capsule 2    Sig: Take 1-2  capsules (300-600 mg total) by mouth at bedtime.   Ubrogepant (UBRELVY) 50 MG TABS 60 tablet 2    Sig: 50 to 100 mg as a single dose; if symptoms persist or return, may repeat dose after =2 hours. Maximum: 200 mg per 24 hours    Orders Placed This Encounter  Procedures   KNEE INJECTION    Local Anesthetic & Steroid injection.    Standing Status:   Standing    Number of Occurrences:   3    Standing Expiration Date:   11/03/2021    Scheduling Instructions:     Bilateral knee long acting steroid injection PRN, pt will call    Order Specific Question:   Where will this procedure be performed?    Answer:   [facility]      Follow-up plan:   Return in about 3 months (around 08/04/2021) for Medication Management, in person.     Status post left genicular RFA 04/16/2018, status post right genicular RFA 03/12/2018.  Provided her with benefit.  Status post left knee intra-articular steroid injection #3 on 11/21/2018.   hyalgan left knee #1 on 04/17/19, #2 05/22/19, #3 07/03/2019, #4 08/28/19, #5 on 09/25/19, long-acting triamcinolone 02/24/2021 bilateral knee, 75% pain relief            Recent Visits Date Type Provider Dept  02/24/21 Procedure visit Gillis Santa, MD [facility]  02/09/21 Office Visit Gillis Santa, MD [facility]  Showing recent visits within past 90 days and meeting all other requirements Today's Visits Date Type Provider Dept  05/06/21 Office Visit Gillis Santa, MD [facility]  Showing today's visits and meeting all other requirements Future Appointments Date Type Provider Dept  07/29/21 Appointment Gillis Santa, MD [facility]  Showing future appointments within next 90 days and meeting all other requirements  I discussed the assessment and treatment plan with the patient. The patient was provided an opportunity to ask questions and all were answered. The patient agreed with the plan and demonstrated an understanding of the instructions.  Patient advised to call back or seek an in-person evaluation if the symptoms or condition worsens.  Duration of encounter: 67mnutes.  Note by: BGillis Santa MD Date: 05/06/2021; Time: 11:39 AM

## 2021-05-10 ENCOUNTER — Telehealth: Payer: Self-pay | Admitting: Student in an Organized Health Care Education/Training Program

## 2021-05-10 NOTE — Telephone Encounter (Signed)
3rd attempt to reach patient.

## 2021-05-10 NOTE — Telephone Encounter (Signed)
Attempted to reach patient x 2 re; PA for [place].  I need to find out what she has tried and failed for miagraines.

## 2021-05-11 ENCOUNTER — Telehealth: Payer: Self-pay | Admitting: *Deleted

## 2021-05-11 NOTE — Telephone Encounter (Signed)
Called pharmacy and Pamela Romero is still being denied even with qty change.  The PA on CMM ask if she has tried and failed sumatriptan, rizatriptan or naratriptan?    Also attempted to reach Haili, no answer and no voicemail.

## 2021-05-11 NOTE — Telephone Encounter (Signed)
Called to pharmacy re;  Pamela Romero and qty.  PA has been denied.  Message states they need to know if she tried and failed naratriptan, sumatriptan or rizatriptan.  I have tried to reach [name] several times.    Another attempt to reach patient, no answer and no voicemail.

## 2021-05-17 ENCOUNTER — Telehealth: Payer: Self-pay | Admitting: Student in an Organized Health Care Education/Training Program

## 2021-05-17 ENCOUNTER — Other Ambulatory Visit: Payer: Self-pay | Admitting: Student in an Organized Health Care Education/Training Program

## 2021-05-17 MED ORDER — SUMATRIPTAN SUCCINATE 50 MG PO TABS: 50.0000 mg | ORAL_TABLET | Freq: Once | ORAL | 2 refills | Status: DC | PRN

## 2021-05-17 NOTE — Progress Notes (Signed)
Pateint called and made aware of new script sent in.

## 2021-05-17 NOTE — Telephone Encounter (Signed)
Dr. Holley Raring prescribed UBRELVY for patient and it is going to be $1000 out of pocket cost for her. She wanted to let Dr. Holley Raring know this and to see if there is anything else she can take. Bubble sent to Dr. Holley Raring.

## 2021-05-17 NOTE — Telephone Encounter (Signed)
New script sent in for Imitrex.

## 2021-06-09 ENCOUNTER — Other Ambulatory Visit: Payer: Self-pay | Admitting: Internal Medicine

## 2021-06-09 NOTE — Telephone Encounter (Signed)
Requested medication (s) are due for refill today:   No  Instructed to start OTC D3 2,000 units daily ? ?Requested medication (s) are on the active medication list:   Yes ? ?Future visit scheduled:   No ? ? ?Last ordered: 03/18/2021 #12, 0 refills ? ?Returned because this is a non delegated refill also instructed to start OTC D3  ? ?Requested Prescriptions  ?Pending Prescriptions Disp Refills  ? Vitamin D, Ergocalciferol, (DRISDOL) 1.25 MG (50000 UNIT) CAPS capsule [Pharmacy Med Name: VITAMIN D2 1.25MG (50,000 UNIT)] 12 capsule 0  ?  Sig: TAKE 1 CAPSULE BY MOUTH ONCE A WEEK. FOR 12 WEEKS. THEN START OTC VITAMIN D3 2,000 UNIT DAILY.  ?  ? Endocrinology:  Vitamins - Vitamin D Supplementation 2 Failed - 06/09/2021  2:34 PM  ?  ?  Failed - Manual Review: Route requests for 50,000 IU strength to the provider  ?  ?  Failed - Ca in normal range and within 360 days  ?  Calcium  ?Date Value Ref Range Status  ?04/07/2020 9.8 8.9 - 10.3 mg/dL Final  ?  ?  ?  ?  Failed - Vitamin D in normal range and within 360 days  ?  VITD  ?Date Value Ref Range Status  ?05/14/2014 26.66 (L) 30.00 - 100.00 ng/mL Final  ? ?Vit D, 25-Hydroxy  ?Date Value Ref Range Status  ?03/16/2021 14 (L) 30 - 100 ng/mL Final  ?  Comment:  ?  Vitamin D Status         25-OH Vitamin D: ?. ?Deficiency:                    <20 ng/mL ?Insufficiency:             20 - 29 ng/mL ?Optimal:                 > or = 30 ng/mL ?[name] ?For 25-OH Vitamin D testing on patients on  ?D2-supplementation and patients for whom quantitation  ?of D2 and D3 fractions is required, the [facility](TM) ?25-OH VIT D, (D2,D3), LC/MS/MS is recommended: order  ?code [phone] (patients >63yrs). ?See Note 1 ?. ?Note 1 ?. ?For additional information, please refer to  ?http://education.[facility].com/faq/FAQ199  ?(This link is being provided for informational/ ?educational purposes only.) ?  ?  ?  ?  ?  Passed - Valid encounter within last 12 months  ?  Recent Outpatient Visits   ? ?      ? 2 months  ago Purpura ([place])  ? [facility], Coralie Keens, NP  ? 11 months ago Tuberculin skin test encounter  ? [place]  ? 1 year ago Therapeutic opioid-induced constipation (OIC)  ? [place], FNP  ? 1 year ago Muscle cramp  ? [place], FNP  ? 1 year ago Postoperative hypothyroidism  ? [facility], [name], FNP  ? ?  ?  ? ?  ?  ?  ? ?

## 2021-06-14 ENCOUNTER — Telehealth: Payer: Self-pay | Admitting: Student in an Organized Health Care Education/Training Program

## 2021-06-14 ENCOUNTER — Other Ambulatory Visit: Payer: Self-pay | Admitting: Pain Medicine

## 2021-06-14 ENCOUNTER — Telehealth: Payer: Self-pay

## 2021-06-14 DIAGNOSIS — G894 Chronic pain syndrome: Secondary | ICD-10-CM

## 2021-06-14 DIAGNOSIS — G8929 Other chronic pain: Secondary | ICD-10-CM

## 2021-06-14 DIAGNOSIS — M5416 Radiculopathy, lumbar region: Secondary | ICD-10-CM

## 2021-06-14 MED ORDER — HYDROCODONE-ACETAMINOPHEN 7.5-325 MG PO TABS: 1.0000 | ORAL_TABLET | Freq: Four times a day (QID) | ORAL | 0 refills | Status: AC | PRN

## 2021-06-14 NOTE — Telephone Encounter (Signed)
The patient called back and said [facility] so [facility] can [facility] the [facility] one ?

## 2021-06-14 NOTE — Telephone Encounter (Signed)
Message sent to Dr Naveira 

## 2021-06-14 NOTE — Telephone Encounter (Signed)
The husband called back again, stating [facility]. Please call him and let him know whats going on ?

## 2021-06-14 NOTE — Telephone Encounter (Signed)
Prescription cancelled at [facility] after clarifying  [facility]. ?

## 2021-06-14 NOTE — Progress Notes (Signed)
Dr. Dossie Arbour covering for Dr. Holley Raring.  Pain medication had to be resent to pharmacy. ?

## 2021-07-12 ENCOUNTER — Telehealth: Payer: Self-pay | Admitting: Student in an Organized Health Care Education/Training Program

## 2021-07-12 ENCOUNTER — Other Ambulatory Visit: Payer: Self-pay

## 2021-07-12 DIAGNOSIS — G8929 Other chronic pain: Secondary | ICD-10-CM

## 2021-07-12 DIAGNOSIS — G894 Chronic pain syndrome: Secondary | ICD-10-CM

## 2021-07-12 MED ORDER — HYDROCODONE-ACETAMINOPHEN 7.5-325 MG PO TABS: 1.0000 | ORAL_TABLET | Freq: Four times a day (QID) | ORAL | 0 refills | Status: DC | PRN

## 2021-07-12 NOTE — Telephone Encounter (Signed)
[facility] [place] to [facility] script. Please call when this is done ?

## 2021-07-13 NOTE — Telephone Encounter (Signed)
Medication has been resent and patient has been notified and has picked up medications.  ?

## 2021-07-29 ENCOUNTER — Encounter: Payer: Medicare HMO | Admitting: Student in an Organized Health Care Education/Training Program

## 2021-08-03 ENCOUNTER — Telehealth: Payer: Self-pay | Admitting: *Deleted

## 2021-08-03 ENCOUNTER — Encounter: Payer: Self-pay | Admitting: Student in an Organized Health Care Education/Training Program

## 2021-08-03 ENCOUNTER — Ambulatory Visit
Payer: Medicare HMO | Attending: Student in an Organized Health Care Education/Training Program | Admitting: Student in an Organized Health Care Education/Training Program

## 2021-08-03 VITALS — BP 144/97 | HR 85 | Temp 96.6°F | Ht 68.0 in | Wt 200.0 lb

## 2021-08-03 DIAGNOSIS — M170 Bilateral primary osteoarthritis of knee: Secondary | ICD-10-CM

## 2021-08-03 DIAGNOSIS — M1712 Unilateral primary osteoarthritis, left knee: Secondary | ICD-10-CM | POA: Insufficient documentation

## 2021-08-03 DIAGNOSIS — G894 Chronic pain syndrome: Secondary | ICD-10-CM | POA: Diagnosis not present

## 2021-08-03 DIAGNOSIS — M722 Plantar fascial fibromatosis: Secondary | ICD-10-CM | POA: Insufficient documentation

## 2021-08-03 DIAGNOSIS — G8929 Other chronic pain: Secondary | ICD-10-CM | POA: Insufficient documentation

## 2021-08-03 DIAGNOSIS — M5416 Radiculopathy, lumbar region: Secondary | ICD-10-CM

## 2021-08-03 MED ORDER — HYDROCODONE-ACETAMINOPHEN 7.5-325 MG PO TABS: 1.0000 | ORAL_TABLET | Freq: Four times a day (QID) | ORAL | 0 refills | Status: DC | PRN

## 2021-08-03 MED ORDER — GABAPENTIN 300 MG PO CAPS: 300.0000 mg | ORAL_CAPSULE | Freq: Every day | ORAL | 2 refills | Status: DC

## 2021-08-03 MED ORDER — HYDROCODONE-ACETAMINOPHEN 7.5-325 MG PO TABS: 1.0000 | ORAL_TABLET | Freq: Four times a day (QID) | ORAL | 0 refills | Status: AC | PRN

## 2021-08-03 NOTE — Telephone Encounter (Signed)
[facility] [place] [facility] - apap 10-325 mg x 3.  Sent to a different pharmacy.  ?

## 2021-08-03 NOTE — Progress Notes (Signed)
PROVIDER NOTE: Information contained herein reflects review and annotations entered in association with encounter. Interpretation of such information and data should be left to medically-trained personnel. Information provided to patient can be located elsewhere in the medical record under "Patient Instructions". Document created using STT-dictation technology, any transcriptional errors that may result from process are unintentional.  ?  ?Patient: Pamela Romero  Service Category: E/M  Provider: Gillis Santa, MD  ?DOB: [birth year]  DOS: 08/03/2021  Specialty: Interventional Pain Management  ?MRN: [id number]  Setting: Ambulatory outpatient  PCP: [name], FNP  ?Type: Established Patient    Referring Provider: [name], FNP  ?Location: Office  Delivery: Face-to-face    ? ?HPI  ?[name], a 71 y.o. year old female, is here today because of her Primary osteoarthritis of left knee [M17.12]. [name] primary complain today is Knee Pain (left) ? ?Last encounter: My last encounter with her was on 05/06/21 ? ?Pertinent problems: [name] has Major depressive disorder, recurrent, mild ([place]); Bilateral primary osteoarthritis of knee; and Osteoporosis on their pertinent problem list. ?Pain Assessment: Severity of Chronic pain is reported as a 8 /10. Location: Knee Left/pain radiaties down to her feet. Onset: More than a month ago. Quality: Aching, Burning, Constant, Sharp. Timing: Constant. Modifying factor(s): Meds and resting. ?Vitals:  height is $RemoveB'5\' 8"'SzdxkVYD$  (1.727 m) and weight is 200 lb (90.7 kg). Her temperature is 96.6 ?F (35.9 ?C) (abnormal). Her blood pressure is 144/97 (abnormal) and her pulse is 85. Her oxygen saturation is 99%.  ? ?Reason for encounter:  MM and worsening bilateral knee pain, left greater than right ? ?Refill of hydrocodone as below.  No change in dose. ?Patient requesting repeat long-acting Kenalog injection for both knees.  She previously had it done in November and rates  approximately 80% pain relief for 4 months since the injection. ? ? ?Pharmacotherapy Assessment  ?Analgesic: Hydrocodone 7.5 mg 3 times daily as needed with an extra quantity 15/month (#105)For severe breakthrough pain   ? ?Monitoring: ?[place] PMP: PDMP reviewed during this encounter.       ?Pharmacotherapy: No side-effects or adverse reactions reported. ?Compliance: No problems identified. ?Effectiveness: Clinically acceptable.  UDS:  ?Summary  ?Date Value Ref Range Status  ?11/04/2020 Note  Final  ?  Comment:  ?  ==================================================================== ?ToxASSURE Select 13 (MW) ?==================================================================== ?Test                             Result       Flag       Units ? ?Drug Present and Declared for Prescription Verification ?  Hydrocodone                    982          EXPECTED   ng/mg creat ?  Hydromorphone                  186          EXPECTED   ng/mg creat ?  Dihydrocodeine                 211          EXPECTED   ng/mg creat ?  Norhydrocodone                 1188         EXPECTED   ng/mg creat ?   Sources of hydrocodone  include scheduled prescription medications. ?   Hydromorphone, dihydrocodeine and norhydrocodone are expected ?   metabolites of hydrocodone. Hydromorphone and dihydrocodeine are ?   also available as scheduled prescription medications. ? ?==================================================================== ?Test                      Result    Flag   Units      Ref Range ?  Creatinine              66               mg/dL      >=20 ?==================================================================== ?Declared Medications: ? The flagging and interpretation on this report are based on the ? following declared medications.  Unexpected results may arise from ? inaccuracies in the declared medications. ? ? **Note: The testing scope of this panel includes these medications: ? ? Hydrocodone (Norco) ? ? **Note: The testing scope of  this panel does not include the ? following reported medications: ? ? Acetaminophen (Norco) ? Albuterol (Ventolin HFA) ? Esomeprazole (Nexium) ? Gabapentin (Neurontin) ? Hydrochlorothiazide (Zestoretic) ? Levothyroxine (Synthroid) ? Lisinopril (Zestoretic) ? Omeprazole (Prilosec) ?==================================================================== ?For clinical consultation, please call [phone]. ?==================================================================== ?  ?  ? ? ?ROS  ?Constitutional: Denies any fever or chills ?Gastrointestinal: No reported hemesis, hematochezia, vomiting, or acute GI distress ?Musculoskeletal:  Left knee pain right knee pain as well ?Neurological: No reported episodes of acute onset apraxia, aphasia, dysarthria, agnosia, amnesia, paralysis, loss of coordination, or loss of consciousness ? ?Medication Review  ?HYDROcodone-acetaminophen, SUMAtriptan, Vitamin D (Ergocalciferol), albuterol, esomeprazole, gabapentin, levothyroxine, and lisinopril-hydrochlorothiazide ? ?History Review  ?Allergy: [name] has No Known Allergies. ?Drug: [name]  reports no history of drug use. ?Alcohol:  reports no history of alcohol use. ?Tobacco:  reports that she quit smoking about 3 years ago. Her smoking use included cigarettes. She has a 25.00 pack-year smoking history. She has quit using smokeless tobacco. ?Social: [name]  reports that she quit smoking about 3 years ago. Her smoking use included cigarettes. She has a 25.00 pack-year smoking history. She has quit using smokeless tobacco. She reports that she does not drink alcohol and does not use drugs. ?Medical:  has a past medical history of Abnormal EKG, CAD (coronary artery disease), Cancer ([employer]), COPD (chronic obstructive pulmonary disease) ([place]), Endometrial adenocarcinoma ([place]) (2011), Hyperlipidemia, Hypertension, Osteoarthritis, Reflux gastritis, Thyroid disease, tobacco abuse, and Vaginal delivery. ?Surgical: Ms.  Pamela Romero  has a past surgical history that includes Thyroidectomy (2005); Cardiac catheterization (2006); Total abdominal hysterectomy; Breast surgery (Right, March 2014); Breast cyst aspiration (Right, 2014); and Esophagogastroduodenoscopy (N/A, 04/07/2020). ?Family: family history includes Alcohol abuse in her father, maternal aunt, and mother; Cancer in her brother, brother, brother, and maternal aunt; Heart disease in her brother. ? ?Laboratory Chemistry Profile  ? ?Renal ?Lab Results  ?Component Value Date  ? BUN 15 04/07/2020  ? CREATININE 0.82 04/07/2020  ? BCR 22 01/02/2020  ? GFR 60.34 01/21/2015  ? GFRAA 61 01/02/2020  ? GFRNONAA >60 04/07/2020  ?  Hepatic ?Lab Results  ?Component Value Date  ? AST 14 01/02/2020  ? ALT 7 01/02/2020  ? ALBUMIN 4.4 08/29/2017  ? ALKPHOS 126 (H) 08/29/2017  ? HCVAB NEGATIVE 01/21/2015  ?  ?Electrolytes ?Lab Results  ?Component Value Date  ? NA 140 04/07/2020  ? K 3.9 04/07/2020  ? CL 104 04/07/2020  ? CALCIUM 9.8 04/07/2020  ? MG 1.9 06/04/2018  ?  Bone ?Lab Results  ?  Component Value Date  ? VD25OH 14 (L) 03/16/2021  ?  ?Inflammation (CRP: Acute Phase) (ESR: Chronic Phase) ?Lab Results  ?Component Value Date  ? ESRSEDRATE 19 03/30/2012  ?    ?  ? ?Note: Above Lab results reviewed. ? ? ?Physical Exam  ?General appearance: Well nourished, well developed, and well hydrated. In no apparent acute distress ?Mental status: Alert, oriented x 3 (person, place, & time)       ?Respiratory: No evidence of acute respiratory distress ?Eyes: PERLA ?Vitals: BP (!) 144/97   Pulse 85   Temp (!) 96.6 ?F (35.9 ?C)   Ht $R'5\' 8"'yv$  (1.727 m)   Wt 200 lb (90.7 kg)   SpO2 99%   BMI 30.41 kg/m?  ?BMI: Estimated body mass index is 30.41 kg/m? as calculated from the following: ?  Height as of this encounter: $RemoveBeforeD'5\' 8"'QmVXQcTmpAAyht$  (1.727 m). ?  Weight as of this encounter: 200 lb (90.7 kg). ?Ideal: Ideal body weight: 63.9 kg (140 lb 14 oz) ?Adjusted ideal body weight: 74.6 kg (164 lb 8.4 oz) ? ?Limited range of motion  of left knee, pain with weightbearing ?Arthropathic pain pattern of left knee pain right knee ? ?Assessment  ? ?Status Diagnosis  ?Having a Flare-up ?Having a Flare-up ?Having a Flare-up 1. Primary osteoarthritis

## 2021-08-03 NOTE — Progress Notes (Signed)
Nursing Pain Medication Assessment:  ?Safety precautions to be maintained throughout the outpatient stay will include: orient to surroundings, keep bed in low position, maintain call bell within reach at all times, provide assistance with transfer out of bed and ambulation.  ?Medication Inspection Compliance: Pill count conducted under aseptic conditions, in front of the patient. Neither the pills nor the bottle was removed from the patient's sight at any time. Once count was completed pills were immediately returned to the patient in their original bottle. ? ?Medication: Hydrocodone/APAP ?Pill/Patch Count:  34 of 105 pills remain ?Pill/Patch Appearance: Markings consistent with prescribed medication ?Bottle Appearance: Standard pharmacy container. Clearly labeled. ?Filled Date: 4 / 3 / 2023 ?Last Medication intake:  YesterdaySafety precautions to be maintained throughout the outpatient stay will include: orient to surroundings, keep bed in low position, maintain call bell within reach at all times, provide assistance with transfer out of bed and ambulation.  ?

## 2021-08-06 ENCOUNTER — Ambulatory Visit (INDEPENDENT_AMBULATORY_CARE_PROVIDER_SITE_OTHER): Payer: Medicare HMO | Admitting: Internal Medicine

## 2021-08-06 ENCOUNTER — Encounter: Payer: Self-pay | Admitting: Internal Medicine

## 2021-08-06 VITALS — BP 114/68 | HR 78 | Temp 96.6°F | Wt 219.0 lb

## 2021-08-06 DIAGNOSIS — D631 Anemia in chronic kidney disease: Secondary | ICD-10-CM | POA: Insufficient documentation

## 2021-08-06 DIAGNOSIS — F411 Generalized anxiety disorder: Secondary | ICD-10-CM | POA: Diagnosis not present

## 2021-08-06 DIAGNOSIS — R5382 Chronic fatigue, unspecified: Secondary | ICD-10-CM | POA: Diagnosis not present

## 2021-08-06 DIAGNOSIS — R0683 Snoring: Secondary | ICD-10-CM | POA: Diagnosis not present

## 2021-08-06 DIAGNOSIS — F330 Major depressive disorder, recurrent, mild: Secondary | ICD-10-CM

## 2021-08-06 DIAGNOSIS — E890 Postprocedural hypothyroidism: Secondary | ICD-10-CM | POA: Diagnosis not present

## 2021-08-06 DIAGNOSIS — N1831 Chronic kidney disease, stage 3a: Secondary | ICD-10-CM | POA: Diagnosis not present

## 2021-08-06 DIAGNOSIS — D649 Anemia, unspecified: Secondary | ICD-10-CM | POA: Insufficient documentation

## 2021-08-06 NOTE — Progress Notes (Signed)
? ?Subjective:  ? ? Patient ID: Pamela Romero, female    DOB: [birth year], 71 y.o.   MRN: [id number] ? ?HPI ? ?Patient presents to clinic today with complaint of constant fatigue.  She noticed this 2-3 months ago. She denies any new changes in diet, medication or her routine. 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 She has difficulty falling asleep and stay asleep, for which she takes Gabapentin. She gets about 3-4 hours of sleep per night. She does not feel rested when she wakes up. She does snore, but she has not had any apneic episodes. She does not nap during the day. She had a sleep study 12/2019. She does get some walking in, but no routine exercise. She does feel depressed at times but is not taking any medications for this. She does have a history of chronic pain, COPD, anemia of CKD and hypothyroidism. ? ?Review of Systems ? ?   ?Past Medical History:  ?Diagnosis Date  ? Abnormal EKG   ? CAD (coronary artery disease)   ? non critical, by cardiac cath 2006  ? Cancer [facility])   ? endometrial Ca  ? COPD (chronic obstructive pulmonary disease) ([place])   ? Endometrial adenocarcinoma ([place]) 2011  ? Hyperlipidemia   ? Hypertension   ? Osteoarthritis   ? Right foot, Left knee (Dr. Roland Rack)  ? Reflux gastritis   ? Thyroid disease   ? tobacco abuse   ? Vaginal delivery   ? x 2  ? ? ?Current Outpatient Medications  ?Medication Sig Dispense Refill  ? albuterol (PROAIR HFA) 108 (90 Base) MCG/ACT inhaler Inhale 1-2 puffs into the lungs every 6 (six) hours as needed for wheezing or shortness of breath. 6.7 g 1  ? esomeprazole (NEXIUM) 40 MG capsule Take 40 mg by mouth daily.    ? gabapentin (NEURONTIN) 300 MG capsule Take 1-2 capsules (300-600 mg total) by mouth at bedtime. 180 capsule 2  ? [START ON 08/11/2021] HYDROcodone-acetaminophen (NORCO) 7.5-325 MG tablet Take 1 tablet by mouth every 6 (six) hours as needed for severe pain. Must last 30 days. 105 tablet 0  ? [START ON 10/10/2021] HYDROcodone-acetaminophen (NORCO)  7.5-325 MG tablet Take 1 tablet by mouth every 6 (six) hours as needed for severe pain. Must last 30 days. 105 tablet 0  ? [START ON 09/10/2021] HYDROcodone-acetaminophen (NORCO) 7.5-325 MG tablet Take 1 tablet by mouth every 6 (six) hours as needed for severe pain. Must last 30 days. 105 tablet 0  ? levothyroxine (SYNTHROID) 137 MCG tablet TAKE 1 TABLET (137 MCG TOTAL) BY MOUTH DAILY BEFORE BREAKFAST. 90 tablet 0  ? lisinopril-hydrochlorothiazide (ZESTORETIC) 20-12.5 MG tablet Take 1 tablet by mouth daily. 90 tablet 3  ? SUMAtriptan (IMITREX) 50 MG tablet Take 1 tablet (50 mg total) by mouth once as needed for migraine. May repeat in 2 hours if headache persists or recurs. 30 tablet 2  ? Vitamin D, Ergocalciferol, (DRISDOL) 1.25 MG (50000 UNIT) CAPS capsule Take 1 capsule (50,000 Units total) by mouth once a week. For 12 weeks. Then start OTC Vitamin D3 2,000 unit daily. 12 capsule 0  ? ?No current facility-administered medications for this visit.  ? ? ?No Known Allergies ? ?Family History  ?Problem Relation Age of Onset  ? Alcohol abuse Father   ? Cancer Maternal Aunt   ?     stomach  ? Alcohol abuse Maternal Aunt   ? Alcohol abuse Mother   ? Heart disease Brother   ?  MI age 49- non smoker  ? Cancer Brother   ?     Pancreatic  ? Cancer Brother   ?     colon, dx'd age 44  ? Cancer Brother   ?     stomach, paternal half brother  ? Breast cancer Neg Hx   ? ? ?Social History  ? ?Socioeconomic History  ? Marital status: Married  ?  Spouse name: Not on file  ? Number of children: 0  ? Years of education: Not on file  ? Highest education level: Not on file  ?Occupational History  ? Occupation: retired   ?Tobacco Use  ? Smoking status: Former  ?  Packs/day: 0.50  ?  Years: 50.00  ?  Pack years: 25.00  ?  Types: Cigarettes  ?  Quit date: 05/11/2018  ?  Years since quitting: 3.2  ? Smokeless tobacco: Former  ?Vaping Use  ? Vaping Use: Never used  ?Substance and Sexual Activity  ? Alcohol use: No  ? Drug use: No  ? Sexual  activity: Not on file  ?Other Topics Concern  ? Not on file  ?Social History Narrative  ? Lives with spouse, takes care of her mother.  ? Had 2 children, one died at birth, the other given up for adoption (was unmarried).  ? ?Social Determinants of Health  ? ?Financial Resource Strain: Not on file  ?Food Insecurity: Not on file  ?Transportation Needs: Not on file  ?Physical Activity: Not on file  ?Stress: Not on file  ?Social Connections: Not on file  ?Intimate Partner Violence: Not on file  ? ? ? ?Constitutional: Patient reports fatigue.  Denies fever, malaise, headache or abrupt weight changes.  ?HEENT: Denies eye pain, eye redness, ear pain, ringing in the ears, wax buildup, runny nose, nasal congestion, bloody nose, or sore throat. ?Respiratory: Denies difficulty breathing, shortness of breath, cough or sputum production.   ?Cardiovascular: Denies chest pain, chest tightness, palpitations or swelling in the hands or feet.  ?Gastrointestinal: Denies abdominal pain, bloating, constipation, diarrhea or blood in the stool.  ?GU: Denies urgency, frequency, pain with urination, burning sensation, blood in urine, odor or discharge. ?Musculoskeletal: Patient reports chronic joint and muscle pain.  Denies decrease in range of motion, difficulty with gait, or joint swelling.  ?Skin: Denies redness, rashes, lesions or ulcercations.  ?Neurological: Patient reports snoring.  Denies dizziness, difficulty with memory, difficulty with speech or problems with balance and coordination.  ?Psych: Patient has a history of anxiety and depression.  Denies SI/HI. ? ?No other specific complaints in a complete review of systems (except as listed in HPI above). ? ?Objective:  ? Physical Exam ? ?BP 114/68 (BP Location: Left Arm, Patient Position: Sitting)   Pulse 78   Temp (!) 96.6 ?F (35.9 ?C) (Temporal)   Wt 219 lb (99.3 kg)   SpO2 99%   BMI 33.30 kg/m?  ? ?Wt Readings from Last 3 Encounters:  ?08/03/21 200 lb (90.7 kg)  ?05/06/21  200 lb (90.7 kg)  ?03/16/21 210 lb 3.2 oz (95.3 kg)  ? ? ?General: Appears her stated age, obese, in NAD. ?Skin: Warm, dry and intact.  Senile purpura noted. ?HEENT: Head: normal shape and size; Eyes: sclera white, no icterus, conjunctiva pink, PERRLA and EOMs intact;  ?Neck:  Neck supple, trachea midline. No masses, lumps or thyromegaly present.  ?Cardiovascular: Normal rate and rhythm. S1,S2 noted.  No murmur, rubs or gallops noted.  ?Pulmonary/Chest: Normal effort and positive vesicular breath sounds. No  respiratory distress. No wheezes, rales or ronchi noted.  ?Musculoskeletal: She has difficulty getting from a sitting to a standing position.  No difficulty with gait.  ?Neurological: Alert and oriented.  ?Psychiatric: Mood and affect flat.  Tearful at times.  Behavior is normal. Judgment and thought content normal.  ? ? ? ?BMET ?   ?Component Value Date/Time  ? NA 140 04/07/2020 1022  ? NA 141 08/29/2017 0950  ? K 3.9 04/07/2020 1022  ? CL 104 04/07/2020 1022  ? CO2 28 04/07/2020 1022  ? GLUCOSE 88 04/07/2020 1022  ? BUN 15 04/07/2020 1022  ? BUN 19 08/29/2017 0950  ? CREATININE 0.82 04/07/2020 1022  ? CREATININE 1.09 (H) 01/02/2020 1453  ? CALCIUM 9.8 04/07/2020 1022  ? GFRNONAA >60 04/07/2020 1022  ? GFRNONAA 52 (L) 01/02/2020 1453  ? GFRAA 61 01/02/2020 1453  ? ? ?Lipid Panel  ?   ?Component Value Date/Time  ? CHOL 186 08/30/2019 1131  ? TRIG 132 08/30/2019 1131  ? HDL 43 (L) 08/30/2019 1131  ? CHOLHDL 4.3 08/30/2019 1131  ? VLDL 42.0 (H) 05/14/2014 5456  ? LDLCALC 118 (H) 08/30/2019 1131  ? ? ?CBC ?   ?Component Value Date/Time  ? WBC 6.6 03/16/2021 0848  ? RBC 3.57 (L) 03/16/2021 0848  ? HGB 11.6 (L) 03/16/2021 0848  ? HCT 34.8 (L) 03/16/2021 0848  ? PLT 135 (L) 03/16/2021 0848  ? MCV 97.5 03/16/2021 0848  ? MCH 32.5 03/16/2021 0848  ? MCHC 33.3 03/16/2021 0848  ? RDW 12.4 03/16/2021 0848  ? LYMPHSABS 1,270 01/02/2020 1453  ? MONOABS 0.4 09/16/2013 1046  ? EOSABS 41 01/02/2020 1453  ? BASOSABS 31 01/02/2020  1453  ? ? ?Hgb A1C ?Lab Results  ?Component Value Date  ? HGBA1C 5.1 11/06/2018  ? ? ? ? ? ? ?   ?Assessment & Plan:  ? ?Chronic Fatigue, Snoring, Anxiety and Depression, Anemia of CKD, Hypothyroidism: ? ?H

## 2021-08-06 NOTE — Patient Instructions (Signed)
Fatigue ?If you have fatigue, you feel tired all the time and have a lack of energy or a lack of motivation. Fatigue may make it difficult to start or complete tasks because of exhaustion. ?Occasional or mild fatigue is often a normal response to activity or life. However, long-term (chronic) or extreme fatigue may be a symptom of a medical condition such as: ?Depression. ?Not having enough red blood cells or hemoglobin in the blood (anemia). ?A problem with a small gland located in the lower front part of the neck (thyroid disorder). ?Rheumatologic conditions. These are problems related to the body's defense system (immune system). ?Infections, especially certain viral infections. ?Fatigue can also lead to negative health outcomes over time. ?Follow these instructions at home: ?Medicines ?Take over-the-counter and prescription medicines only as told by your health care provider. ?Take a multivitamin if told by your health care provider. ?Do not use herbal or dietary supplements unless they are approved by your health care provider. ?Eating and drinking ? ?Avoid heavy meals in the evening. ?Eat a well-balanced diet, which includes lean proteins, whole grains, plenty of fruits and vegetables, and low-fat dairy products. ?Avoid eating or drinking too many products with caffeine in them. ?Avoid alcohol. ?Drink enough fluid to keep your urine pale yellow. ?Activity ? ?Exercise regularly, as told by your health care provider. ?Use or practice techniques to help you relax, such as yoga, tai chi, meditation, or massage therapy. ?Lifestyle ?Change situations that cause you stress. Try to keep your work and personal schedules in balance. ?Do not use recreational or illegal drugs. ?General instructions ?Monitor your fatigue for any changes. ?Go to bed and get up at the same time every day. ?Avoid fatigue by pacing yourself during the day and getting enough sleep at night. ?Maintain a healthy weight. ?Contact a health care  provider if: ?Your fatigue does not get better. ?You have a fever. ?You suddenly lose or gain weight. ?You have headaches. ?You have trouble falling asleep or sleeping through the night. ?You feel angry, guilty, anxious, or sad. ?You have swelling in your legs or another part of your body. ?Get help right away if: ?You feel confused, feel like you might faint, or faint. ?Your vision is blurry or you have a severe headache. ?You have severe pain in your abdomen, your back, or the area between your waist and hips (pelvis). ?You have chest pain, shortness of breath, or an irregular or fast heartbeat. ?You are unable to urinate, or you urinate less than normal. ?You have abnormal bleeding from the rectum, nose, lungs, nipples, or, if you are female, the vagina. ?You vomit blood. ?You have thoughts about hurting yourself or others. ?These symptoms may be an emergency. Get help right away. Call 911. ?Do not wait to see if the symptoms will go away. ?Do not drive yourself to the hospital. ?Get help right away if you feel like you may hurt yourself or others, or have thoughts about taking your own life. Go to your nearest emergency room or: ?Call 911. ?Call the National Suicide Prevention Lifeline at 1-800-273-8255 or 988. This is open 24 hours a day. ?Text the Crisis Text Line at 741741. ?Summary ?If you have fatigue, you feel tired all the time and have a lack of energy or a lack of motivation. ?Fatigue may make it difficult to start or complete tasks because of exhaustion. ?Long-term (chronic) or extreme fatigue may be a symptom of a medical condition. ?Exercise regularly, as told by your health care provider. ?  Change situations that cause you stress. Try to keep your work and personal schedules in balance. ?This information is not intended to replace advice given to you by your health care provider. Make sure you discuss any questions you have with your health care provider. ?Document Revised: 01/18/2021 Document  Reviewed: 01/18/2021 ?Elsevier Patient Education ? 2023 Elsevier Inc. ? ?

## 2021-08-07 LAB — IRON,TIBC AND FERRITIN PANEL
%SAT: 28 % (calc) (ref 16–45)
Ferritin: 19 ng/mL (ref 16–288)
Iron: 91 ug/dL (ref 45–160)
TIBC: 324 mcg/dL (calc) (ref 250–450)

## 2021-08-07 LAB — CBC WITH DIFFERENTIAL/PLATELET
Absolute Monocytes: 390 cells/uL (ref 200–950)
Basophils Absolute: 33 cells/uL (ref 0–200)
Basophils Relative: 0.5 %
Eosinophils Absolute: 72 cells/uL (ref 15–500)
Eosinophils Relative: 1.1 %
HCT: 37.2 % (ref 35.0–45.0)
Hemoglobin: 12.5 g/dL (ref 11.7–15.5)
Lymphs Abs: 1671 cells/uL (ref 850–3900)
MCH: 33.8 pg — ABNORMAL HIGH (ref 27.0–33.0)
MCHC: 33.6 g/dL (ref 32.0–36.0)
MCV: 100.5 fL — ABNORMAL HIGH (ref 80.0–100.0)
MPV: 10.3 fL (ref 7.5–12.5)
Monocytes Relative: 6 %
Neutro Abs: 4336 cells/uL (ref 1500–7800)
Neutrophils Relative %: 66.7 %
Platelets: 159 10*3/uL (ref 140–400)
RBC: 3.7 10*6/uL — ABNORMAL LOW (ref 3.80–5.10)
RDW: 12.6 % (ref 11.0–15.0)
Total Lymphocyte: 25.7 %
WBC: 6.5 10*3/uL (ref 3.8–10.8)

## 2021-08-07 LAB — VITAMIN D 25 HYDROXY (VIT D DEFICIENCY, FRACTURES): Vit D, 25-Hydroxy: 10 ng/mL — ABNORMAL LOW (ref 30–100)

## 2021-08-07 LAB — BASIC METABOLIC PANEL
BUN/Creatinine Ratio: 16 (calc) (ref 6–22)
BUN: 20 mg/dL (ref 7–25)
CO2: 32 mmol/L (ref 20–32)
Calcium: 9.4 mg/dL (ref 8.6–10.4)
Chloride: 102 mmol/L (ref 98–110)
Creat: 1.29 mg/dL — ABNORMAL HIGH (ref 0.50–1.05)
Glucose, Bld: 70 mg/dL (ref 65–99)
Potassium: 3.8 mmol/L (ref 3.5–5.3)
Sodium: 142 mmol/L (ref 135–146)

## 2021-08-07 LAB — T4, FREE: Free T4: 0.5 ng/dL — ABNORMAL LOW (ref 0.8–1.8)

## 2021-08-07 LAB — VITAMIN B12: Vitamin B-12: 186 pg/mL — ABNORMAL LOW (ref 200–1100)

## 2021-08-07 LAB — TSH: TSH: 73.34 mIU/L — ABNORMAL HIGH (ref 0.40–4.50)

## 2021-08-07 MED ORDER — VITAMIN D (ERGOCALCIFEROL) 1.25 MG (50000 UNIT) PO CAPS: 50000.0000 [IU] | ORAL_CAPSULE | ORAL | 0 refills | Status: DC

## 2021-08-07 NOTE — Addendum Note (Signed)
Addended by: Jearld Fenton on: 08/07/2021 07:00 PM ? ? Modules accepted: Orders ? ?

## 2021-08-09 ENCOUNTER — Ambulatory Visit: Payer: Self-pay | Admitting: *Deleted

## 2021-08-09 NOTE — Telephone Encounter (Signed)
Second attempt to reach patient- no answer and call can not be completed at this time message-try again later. ?

## 2021-08-09 NOTE — Telephone Encounter (Signed)
Third attempt to reach pt, routing to practice for PCPs review per protocol.  ?

## 2021-08-09 NOTE — Telephone Encounter (Signed)
Patient called for lab results: ?Jearld Fenton, NP  ?08/07/2021  6:59 PM EDT   ?  ?B12 is low.  At this point I would recommend B12 shots.  Please let me know if she is agreeable and I will order this for her.  Her anemia has improved.  TSH is very elevated.  Has she been taking her levothyroxine as prescribed?  If so we need to increase her dose to 250 mcg daily.  Her kidney function is slightly worse.  She needs to make sure she is consuming adequate amounts of water.  She should be avoiding NSAIDs OTC.  Vitamin D is low.  I need to send in prescription vitamin D for her to take weekly for the next 12 weeks.  Her iron panel and stores are normal.  The combination of her low B12, elevated TSH and low vitamin D is likely contributing to her severe fatigue at this time.  ?Attempted to call patient with lab results- no answer-no message ?

## 2021-08-11 ENCOUNTER — Other Ambulatory Visit: Payer: Self-pay | Admitting: *Deleted

## 2021-08-11 ENCOUNTER — Telehealth: Payer: Self-pay

## 2021-08-11 DIAGNOSIS — G894 Chronic pain syndrome: Secondary | ICD-10-CM

## 2021-08-11 DIAGNOSIS — E890 Postprocedural hypothyroidism: Secondary | ICD-10-CM

## 2021-08-11 DIAGNOSIS — G8929 Other chronic pain: Secondary | ICD-10-CM

## 2021-08-11 MED ORDER — HYDROCODONE-ACETAMINOPHEN 7.5-325 MG PO TABS: 1.0000 | ORAL_TABLET | Freq: Four times a day (QID) | ORAL | 0 refills | Status: DC | PRN

## 2021-08-11 NOTE — Telephone Encounter (Signed)
Her pharmacy doesn't have her medicine, they only have a higher dose. Her husband wants someone to call them and let them know what to do.  ?

## 2021-08-11 NOTE — Telephone Encounter (Signed)
Called patient and husband to call pharmacies and see which ones have it. States he will call back with update. [facility] called and shortage confirmed. ?

## 2021-08-11 NOTE — Telephone Encounter (Signed)
Jearld Fenton, NP  ?08/07/2021  6:59 PM EDT   ?  ?B12 is low.  At this point I would recommend B12 shots.  Please let me know if she is agreeable and I will order this for her.  Her anemia has improved.  TSH is very elevated.  Has she been taking her levothyroxine as prescribed?  If so we need to increase her dose to 250 mcg daily.  Her kidney function is slightly worse.  She needs to make sure she is consuming adequate amounts of water.  She should be avoiding NSAIDs OTC.  Vitamin D is low.  I need to send in prescription vitamin D for her to take weekly for the next 12 weeks.  Her iron panel and stores are normal.  The combination of her low B12, elevated TSH and low vitamin D is likely contributing to her severe fatigue at this time.  ? ?

## 2021-08-11 NOTE — Telephone Encounter (Signed)
Pt advised.  She agreed to start B12 injections and increase levothyroxine.  Please verify the dose?   ? ? ?Thanks,  ? ?-Mickel Baas  ?

## 2021-08-12 MED ORDER — CYANOCOBALAMIN 1000 MCG/ML IJ SOLN: INTRAMUSCULAR | 0 refills | Status: DC

## 2021-08-12 MED ORDER — LEVOTHYROXINE SODIUM 150 MCG PO TABS: 150.0000 ug | ORAL_TABLET | Freq: Every day | ORAL | 3 refills | Status: DC

## 2021-08-12 NOTE — Addendum Note (Signed)
Addended by: Jearld Fenton on: 08/12/2021 07:52 AM ? ? Modules accepted: Orders ? ?

## 2021-08-12 NOTE — Telephone Encounter (Signed)
I have increased her levothyroxine to 150 mcg daily and sent Rx to pharmacy.  She will need lab only appointment in 1 month for follow-up TSH.  I have also ordered B12 injections to her pharmacy.  She will get 1000 mL every 2 weeks for the next 2 months.  She will need to schedule nurse visits to have this done. ?

## 2021-08-13 ENCOUNTER — Ambulatory Visit: Payer: Medicare HMO

## 2021-08-13 NOTE — Telephone Encounter (Signed)
Apt 08/16/2021 at 9am ? ?Thanks,  ? ?-Mickel Baas  ?

## 2021-08-16 ENCOUNTER — Ambulatory Visit (INDEPENDENT_AMBULATORY_CARE_PROVIDER_SITE_OTHER): Payer: Medicare HMO | Admitting: Internal Medicine

## 2021-08-16 DIAGNOSIS — E538 Deficiency of other specified B group vitamins: Secondary | ICD-10-CM

## 2021-08-16 MED ORDER — CYANOCOBALAMIN 1000 MCG/ML IJ SOLN
1000.0000 ug | Freq: Once | INTRAMUSCULAR | Status: AC
  Administered 2021-08-16: 1000 ug via INTRAMUSCULAR

## 2021-08-30 ENCOUNTER — Ambulatory Visit: Payer: Medicare HMO

## 2021-08-30 ENCOUNTER — Telehealth: Payer: Self-pay | Admitting: Internal Medicine

## 2021-08-30 NOTE — Telephone Encounter (Signed)
Patient's husband called in to cancel B12 injection appt today at 9:30 and would like to reschedule for Wednesday afternoon if possible.

## 2021-09-01 ENCOUNTER — Ambulatory Visit (INDEPENDENT_AMBULATORY_CARE_PROVIDER_SITE_OTHER): Payer: Medicare HMO

## 2021-09-01 DIAGNOSIS — E538 Deficiency of other specified B group vitamins: Secondary | ICD-10-CM

## 2021-09-01 MED ORDER — CYANOCOBALAMIN 1000 MCG/ML IJ SOLN
1000.0000 ug | Freq: Once | INTRAMUSCULAR | Status: AC
  Administered 2021-09-01: 1000 ug via INTRAMUSCULAR

## 2021-09-08 ENCOUNTER — Telehealth: Payer: Self-pay | Admitting: Student in an Organized Health Care Education/Training Program

## 2021-09-08 ENCOUNTER — Other Ambulatory Visit: Payer: Self-pay | Admitting: Student in an Organized Health Care Education/Training Program

## 2021-09-08 DIAGNOSIS — G894 Chronic pain syndrome: Secondary | ICD-10-CM

## 2021-09-08 DIAGNOSIS — G8929 Other chronic pain: Secondary | ICD-10-CM

## 2021-09-08 NOTE — Telephone Encounter (Signed)
Pt's Spouse stated that their regular Pharmacy was out of Hydrocodone. They would like a new script sent to [facility] . Please call Pt and Pharmacy with an update.

## 2021-09-08 NOTE — Telephone Encounter (Signed)
New script sent to Dr. Holley Raring to sign off on. 09/08/2021 at 1515.

## 2021-09-15 ENCOUNTER — Ambulatory Visit (INDEPENDENT_AMBULATORY_CARE_PROVIDER_SITE_OTHER): Payer: Medicare HMO | Admitting: Internal Medicine

## 2021-09-15 ENCOUNTER — Ambulatory Visit: Payer: Medicare HMO

## 2021-09-15 ENCOUNTER — Encounter: Payer: Self-pay | Admitting: Internal Medicine

## 2021-09-15 VITALS — BP 126/84 | HR 103 | Temp 96.8°F | Wt 214.0 lb

## 2021-09-15 DIAGNOSIS — G459 Transient cerebral ischemic attack, unspecified: Secondary | ICD-10-CM | POA: Diagnosis not present

## 2021-09-15 DIAGNOSIS — K2100 Gastro-esophageal reflux disease with esophagitis, without bleeding: Secondary | ICD-10-CM

## 2021-09-15 DIAGNOSIS — E538 Deficiency of other specified B group vitamins: Secondary | ICD-10-CM | POA: Diagnosis not present

## 2021-09-15 DIAGNOSIS — F411 Generalized anxiety disorder: Secondary | ICD-10-CM

## 2021-09-15 DIAGNOSIS — F330 Major depressive disorder, recurrent, mild: Secondary | ICD-10-CM | POA: Diagnosis not present

## 2021-09-15 DIAGNOSIS — R252 Cramp and spasm: Secondary | ICD-10-CM | POA: Diagnosis not present

## 2021-09-15 MED ORDER — SERTRALINE HCL 50 MG PO TABS: ORAL_TABLET | ORAL | 1 refills | Status: DC

## 2021-09-15 MED ORDER — CYANOCOBALAMIN 1000 MCG/ML IJ SOLN
1000.0000 ug | Freq: Once | INTRAMUSCULAR | Status: AC
  Administered 2021-09-15: 1000 ug via INTRAMUSCULAR

## 2021-09-15 MED ORDER — ESOMEPRAZOLE MAGNESIUM 40 MG PO CPDR: 40.0000 mg | DELAYED_RELEASE_CAPSULE | Freq: Two times a day (BID) | ORAL | 0 refills | Status: AC

## 2021-09-15 NOTE — Progress Notes (Signed)
Subjective:    Patient ID: Pamela Romero, female    DOB: 09/13/1950, 71 y.o.   MRN: [id number]  HPI  Patient presents to clinic today for multiple concerns.  She would like to get her B12 shot.  Her recent B12 level was 186, 07/2021.  So far she has not noticed any improvement in her energy levels.  She also reports muscle cramping.  She reports this has been an ongoing issue.  She reports cramping in her abdomen and her thighs.  She is on HCTZ but not currently taking a Potassium supplement.  She reports that she does drink a lot of water daily.  She also wants to follow-up on depression.  This has been a persistent issue.  This is being triggered by stress at home, feelings of loneliness after the death of most of her family members.  She is not currently taking any medications for this.  She is not currently seeing a therapist.  She denies anxiety, SI/HI.  She also reports intermittent episodes where all of a sudden she will have a sudden loss of vision.  She reports her vision will go white and then resolve within a matter of seconds.  She denies associated headaches, dizziness, confusion, difficulty with speech, difficulty with swallowing or unilateral weakness.  She is concerned about what could be causing this.  Review of Systems     Past Medical History:  Diagnosis Date   Abnormal EKG    CAD (coronary artery disease)    non critical, by cardiac cath 2006   Cancer [facility])    endometrial Ca   COPD (chronic obstructive pulmonary disease) ([place])    Endometrial adenocarcinoma ([place]) 2011   Hyperlipidemia    Hypertension    Osteoarthritis    Right foot, Left knee (Dr. Roland Rack)   Reflux gastritis    Thyroid disease    tobacco abuse    Vaginal delivery    x 2    Current Outpatient Medications  Medication Sig Dispense Refill   albuterol (PROAIR HFA) 108 (90 Base) MCG/ACT inhaler Inhale 1-2 puffs into the lungs every 6 (six) hours as needed for wheezing or shortness of breath.  6.7 g 1   cyanocobalamin (,VITAMIN B-12,) 1000 MCG/ML injection Inject 1 ML SQ every 2 weeks x 2 months 4 mL 0   esomeprazole (NEXIUM) 40 MG capsule Take 40 mg by mouth daily.     gabapentin (NEURONTIN) 300 MG capsule Take 1-2 capsules (300-600 mg total) by mouth at bedtime. 180 capsule 2   HYDROcodone-acetaminophen (NORCO) 7.5-325 MG tablet Take 1 tablet by mouth every 6 (six) hours as needed for severe pain. Must last 30 days. 105 tablet 0   [START ON 10/10/2021] HYDROcodone-acetaminophen (NORCO) 7.5-325 MG tablet Take 1 tablet by mouth every 6 (six) hours as needed for severe pain. Must last 30 days. 105 tablet 0   HYDROcodone-acetaminophen (NORCO) 7.5-325 MG tablet TAKE 1 TABLET BY MOUTH EVERY 6 HOURS AS NEEDED FOR SEVERE PAIN. MUST LAST 30 DAYS 105 tablet 0   levothyroxine (SYNTHROID) 150 MCG tablet Take 1 tablet (150 mcg total) by mouth daily. 90 tablet 3   lisinopril-hydrochlorothiazide (ZESTORETIC) 20-12.5 MG tablet Take 1 tablet by mouth daily. 90 tablet 3   SUMAtriptan (IMITREX) 50 MG tablet Take 1 tablet (50 mg total) by mouth once as needed for migraine. May repeat in 2 hours if headache persists or recurs. 30 tablet 2   Vitamin D, Ergocalciferol, (DRISDOL) 1.25 MG (50000 UNIT) CAPS capsule Take  1 capsule (50,000 Units total) by mouth once a week. For 12 weeks. Then start OTC Vitamin D3 2,000 unit daily. 12 capsule 0   No current facility-administered medications for this visit.    No Known Allergies  Family History  Problem Relation Age of Onset   Alcohol abuse Father    Cancer Maternal Aunt        stomach   Alcohol abuse Maternal Aunt    Alcohol abuse Mother    Heart disease Brother        MI age 88- non smoker   Cancer Brother        Pancreatic   Cancer Brother        colon, dx'd age 30   Cancer Brother        stomach, paternal half brother   Breast cancer Neg Hx     Social History   Socioeconomic History   Marital status: Married    Spouse name: Not on file    Number of children: 0   Years of education: Not on file   Highest education level: Not on file  Occupational History   Occupation: retired   Tobacco Use   Smoking status: Former    Packs/day: 0.50    Years: 50.00    Pack years: 25.00    Types: Cigarettes    Quit date: 05/11/2018    Years since quitting: 3.3   Smokeless tobacco: Former  Scientific laboratory technician Use: Never used  Substance and Sexual Activity   Alcohol use: No   Drug use: No   Sexual activity: Not on file  Other Topics Concern   Not on file  Social History Narrative   Lives with spouse, takes care of her mother.   Had 2 children, one died at birth, the other given up for adoption (was unmarried).   Social Determinants of Health   Financial Resource Strain: Not on file  Food Insecurity: Not on file  Transportation Needs: Not on file  Physical Activity: Not on file  Stress: Not on file  Social Connections: Not on file  Intimate Partner Violence: Not on file     Constitutional: Patient reports chronic fatigue.  Denies fever, malaise, headache or abrupt weight changes.  HEENT: Patient reports intermittent vision loss.  Denies eye pain, eye redness, ear pain, ringing in the ears, wax buildup, runny nose, nasal congestion, bloody nose, or sore throat. Respiratory: Denies difficulty breathing, shortness of breath, cough or sputum production.   Cardiovascular: Denies chest pain, chest tightness, palpitations or swelling in the hands or feet.  Gastrointestinal: Pt reports epigastric pain. Denies bloating, constipation, diarrhea or blood in the stool.  GU: Denies urgency, frequency, pain with urination, burning sensation, blood in urine, odor or discharge. Musculoskeletal: Patient reports muscle cramping in abdomen and thighs.  Denies decrease in range of motion, difficulty with gait, or joint pain and swelling.  Skin: Denies redness, rashes, lesions or ulcercations.  Neurological: Denies dizziness, difficulty with memory,  difficulty with speech or problems with balance and coordination.  Psych: Patient reports depression.  Denies anxiety, SI/HI.  No other specific complaints in a complete review of systems (except as listed in HPI above).  Objective:   Physical Exam  BP 126/84 (BP Location: Right Arm, Patient Position: Sitting, Cuff Size: Normal)   Pulse (!) 103   Temp (!) 96.8 F (36 C) (Temporal)   Wt 214 lb (97.1 kg)   SpO2 99%   BMI 32.54 kg/m   Wt  Readings from Last 3 Encounters:  08/06/21 219 lb (99.3 kg)  08/03/21 200 lb (90.7 kg)  05/06/21 200 lb (90.7 kg)    General: Appears her stated age, obese, in NAD. Skin: Warm, dry and intact. HEENT: Head: normal shape and size; Eyes: sclera white, no icterus, conjunctiva pink, PERRLA and EOMs intact;  Cardiovascular: Tachycardic with normal rhythm. S1,S2 noted.  No murmur, rubs or gallops noted. No JVD or BLE edema. No carotid bruits noted. Pulmonary/Chest: Normal effort and positive vesicular breath sounds. No respiratory distress. No wheezes, rales or ronchi noted.  Abdomen: Soft and nontender. Normal bowel sounds.  Musculoskeletal:  No difficulty with gait.  Neurological: Alert and oriented. Cranial nerves II-XII grossly intact. Coordination normal.  Psychiatric: Tearful.  Judgment and thought content normal.     BMET    Component Value Date/Time   NA 142 08/06/2021 1423   NA 141 08/29/2017 0950   K 3.8 08/06/2021 1423   CL 102 08/06/2021 1423   CO2 32 08/06/2021 1423   GLUCOSE 70 08/06/2021 1423   BUN 20 08/06/2021 1423   BUN 19 08/29/2017 0950   CREATININE 1.29 (H) 08/06/2021 1423   CALCIUM 9.4 08/06/2021 1423   GFRNONAA >60 04/07/2020 1022   GFRNONAA 52 (L) 01/02/2020 1453   GFRAA 61 01/02/2020 1453    Lipid Panel     Component Value Date/Time   CHOL 186 08/30/2019 1131   TRIG 132 08/30/2019 1131   HDL 43 (L) 08/30/2019 1131   CHOLHDL 4.3 08/30/2019 1131   VLDL 42.0 (H) 05/14/2014 0918   LDLCALC 118 (H) 08/30/2019 1131     CBC    Component Value Date/Time   WBC 6.5 08/06/2021 1423   RBC 3.70 (L) 08/06/2021 1423   HGB 12.5 08/06/2021 1423   HCT 37.2 08/06/2021 1423   PLT 159 08/06/2021 1423   MCV 100.5 (H) 08/06/2021 1423   MCH 33.8 (H) 08/06/2021 1423   MCHC 33.6 08/06/2021 1423   RDW 12.6 08/06/2021 1423   LYMPHSABS 1,671 08/06/2021 1423   MONOABS 0.4 09/16/2013 1046   EOSABS 72 08/06/2021 1423   BASOSABS 33 08/06/2021 1423    Hgb A1C Lab Results  Component Value Date   HGBA1C 5.1 11/06/2018           Assessment & Plan:   Intermittent Vision Loss:  Concern for possible TIAs We will obtain MRI brain  Muscle Cramping:  Could be dehydration, diuretic induced vs magnesium deficiency BMET and Mg today Encouraged her to drink adequate amounts of water RTC in 6 weeks for follow-up of depression  Epigastric Pain:  [name], NP

## 2021-09-16 ENCOUNTER — Encounter: Payer: Self-pay | Admitting: Internal Medicine

## 2021-09-16 DIAGNOSIS — E538 Deficiency of other specified B group vitamins: Secondary | ICD-10-CM | POA: Insufficient documentation

## 2021-09-16 LAB — BASIC METABOLIC PANEL WITH GFR
BUN/Creatinine Ratio: 21 (calc) (ref 6–22)
BUN: 27 mg/dL — ABNORMAL HIGH (ref 7–25)
CO2: 28 mmol/L (ref 20–32)
Calcium: 10 mg/dL (ref 8.6–10.4)
Chloride: 104 mmol/L (ref 98–110)
Creat: 1.26 mg/dL — ABNORMAL HIGH (ref 0.50–1.05)
Glucose, Bld: 82 mg/dL (ref 65–99)
Potassium: 4 mmol/L (ref 3.5–5.3)
Sodium: 140 mmol/L (ref 135–146)
eGFR: 47 mL/min/{1.73_m2} — ABNORMAL LOW (ref >=60)

## 2021-09-16 LAB — MAGNESIUM: Magnesium: 1.3 mg/dL — ABNORMAL LOW (ref 1.5–2.5)

## 2021-09-16 NOTE — Assessment & Plan Note (Signed)
Worse likely due to stress Will increase Esomeprazole to 40 mg BID

## 2021-09-16 NOTE — Patient Instructions (Signed)
Major Depressive Disorder, Adult Major depressive disorder is a mental health condition. This disorder affects feelings. It can also affect the body. Symptoms of this condition last most of the day, almost every day, for 2 weeks. This disorder can affect: Relationships. Daily activities, such as work and school. Activities that you normally like to do. What are the causes? The cause of this condition is not known. The disorder is likely caused by a mix of things, including: Your personality, such as being a shy person. Your behavior, or how you act toward others. Your thoughts and feelings. Too much alcohol or drugs. How you react to stress. Health and mental problems that you have had for a long time. Things that hurt you in the past (trauma). Big changes in your life, such as divorce. What increases the risk? The following factors may make you more likely to develop this condition: Having family members with depression. Being a woman. Problems in the family. Low levels of some brain chemicals. Things that caused you pain as a child, especially if you lost a parent or were abused. A lot of stress in your life, such as from: Living without basic needs of life, such as food and shelter. Being treated poorly because of race, sex, or religion (discrimination). Health and mental problems that you have had for a long time. What are the signs or symptoms? The main symptoms of this condition are: Being sad all the time. Being grouchy all the time. Loss of interest in things and activities. Other symptoms include: Sleeping too much or too little. Eating too much or too little. Gaining or losing weight, without knowing why. Feeling tired or having low energy. Being restless and weak. Feeling hopeless, worthless, or guilty. Trouble thinking clearly or making decisions. Thoughts of hurting yourself or others, or thoughts of ending your life. Spending a lot of time alone. Inability to  complete common tasks of daily life. If you have very bad MDD, you may: Believe things that are not true. Hear, see, taste, or feel things that are not there. Have mild depression that lasts for at least 2 years. Feel very sad and hopeless. Have trouble speaking or moving. How is this treated? This condition may be treated with: Talk therapy. This teaches you to know bad thoughts, feelings, and actions and how to change them. This can also help you to communicate with others. This can be done with members of your family. Medicines. These can be used to treat worry (anxiety), depression, or low levels of chemicals in the brain. Lifestyle changes. You may need to: Limit alcohol use. Limit drug use. Get regular exercise. Get plenty of sleep. Make healthy eating choices. Spend more time outdoors. Brain stimulation. This treatment excites the brain. This is done when symptoms are very bad or have not gotten better with other treatments. Follow these instructions at home: Activity Get regular exercise as told. Spend time outdoors as told. Make time to do the things you enjoy. Find ways to deal with stress. Try to: Meditate. Do deep breathing. Spend time in nature. Keep a journal. Return to your normal activities as told by your doctor. Ask your doctor what activities are safe for you. Alcohol and drug use If you drink alcohol: Limit how much you use to: 0-1 drink a day for women. 0-2 drinks a day for men. Be aware of how much alcohol is in your drink. In the [place] one drink equals one 12 oz bottle of beer (355 mL),   one 5 oz glass of wine (148 mL), or one 1 oz glass of hard liquor (44 mL). Talk to your doctor about: Alcohol use. Alcohol can affect some medicines. Any drug use. General instructions  Take over-the-counter and prescription medicines and herbal preparations only as told by your doctor. Eat a healthy diet. Get a lot of sleep. Think about joining a support group.  Your doctor may be able to suggest one. Keep all follow-up visits as told by your doctor. This is important. Where to find more information: National Alliance on Mental Illness: www.nami.org [place] National Institute of Mental Health: www.nimh.nih.gov American Psychiatric Association: www.psychiatry.org/patients-families/ Contact a doctor if: Your symptoms get worse. You get new symptoms. Get help right away if: You hurt yourself. You have serious thoughts about hurting yourself or others. You see, hear, taste, smell, or feel things that are not there. If you ever feel like you may hurt yourself or others, or have thoughts about taking your own life, get help right away. Go to your nearest emergency department or: Call your local emergency services (911 in the [place] Call a suicide crisis helpline, such as the National Suicide Prevention Lifeline at 1-800-273-8255 or 988 in the [place] This is open 24 hours a day in the [place] Text the Crisis Text Line at 741741 (in the [place] Summary Major depressive disorder is a mental health condition. This disorder affects feelings. Symptoms of this condition last most of the day, almost every day, for 2 weeks. The symptoms of this disorder can cause problems with relationships and with daily activities. There are treatments and support for people who get this disorder. You may need more than one type of treatment. Get help right away if you have serious thoughts about hurting yourself or others. This information is not intended to replace advice given to you by your health care provider. Make sure you discuss any questions you have with your health care provider. Document Revised: 10/21/2020 Document Reviewed: 03/09/2019 Elsevier Patient Education  2023 Elsevier Inc.  

## 2021-09-16 NOTE — Assessment & Plan Note (Signed)
Will trial Sertraline 50 mg daily Support offered

## 2021-09-16 NOTE — Assessment & Plan Note (Signed)
She has been doing B12 shots every 2 weeks x 2 months Will start monthly b12 shots x 2 more months then recheck B12 level

## 2021-09-17 ENCOUNTER — Other Ambulatory Visit: Payer: Medicare HMO

## 2021-09-25 ENCOUNTER — Ambulatory Visit
Admission: RE | Admit: 2021-09-25 | Discharge: 2021-09-25 | Disposition: A | Discharge status: Home / Self Care | Payer: Medicare HMO | Source: Ambulatory Visit | Attending: Internal Medicine | Admitting: Internal Medicine

## 2021-09-25 DIAGNOSIS — G459 Transient cerebral ischemic attack, unspecified: Secondary | ICD-10-CM | POA: Diagnosis not present

## 2021-09-25 DIAGNOSIS — R519 Headache, unspecified: Secondary | ICD-10-CM | POA: Diagnosis not present

## 2021-09-25 MED ORDER — GADOBUTROL 1 MMOL/ML IV SOLN
9.0000 mL | Freq: Once | INTRAVENOUS | Status: AC | PRN
Start: 2021-09-25 — End: 2021-09-25
  Administered 2021-09-25: 10 mL via INTRAVENOUS

## 2021-09-29 ENCOUNTER — Telehealth: Payer: Self-pay | Admitting: Internal Medicine

## 2021-09-29 NOTE — Telephone Encounter (Signed)
Addressed in Result [place]

## 2021-09-29 NOTE — Telephone Encounter (Signed)
Pt is requesting a callback for MRI results.  NT was not available when pt called.

## 2021-10-05 ENCOUNTER — Ambulatory Visit (INDEPENDENT_AMBULATORY_CARE_PROVIDER_SITE_OTHER): Payer: Medicare HMO | Admitting: Internal Medicine

## 2021-10-05 ENCOUNTER — Encounter: Payer: Self-pay | Admitting: Internal Medicine

## 2021-10-05 ENCOUNTER — Ambulatory Visit: Payer: Medicare HMO

## 2021-10-05 VITALS — BP 124/72 | HR 90 | Temp 96.8°F | Wt 215.0 lb

## 2021-10-05 DIAGNOSIS — E6609 Other obesity due to excess calories: Secondary | ICD-10-CM

## 2021-10-05 DIAGNOSIS — R7309 Other abnormal glucose: Secondary | ICD-10-CM | POA: Diagnosis not present

## 2021-10-05 DIAGNOSIS — Z0001 Encounter for general adult medical examination with abnormal findings: Secondary | ICD-10-CM | POA: Diagnosis not present

## 2021-10-05 DIAGNOSIS — Z23 Encounter for immunization: Secondary | ICD-10-CM

## 2021-10-05 DIAGNOSIS — E890 Postprocedural hypothyroidism: Secondary | ICD-10-CM | POA: Diagnosis not present

## 2021-10-05 DIAGNOSIS — E538 Deficiency of other specified B group vitamins: Secondary | ICD-10-CM

## 2021-10-05 DIAGNOSIS — R739 Hyperglycemia, unspecified: Secondary | ICD-10-CM

## 2021-10-05 DIAGNOSIS — Z1231 Encounter for screening mammogram for malignant neoplasm of breast: Secondary | ICD-10-CM | POA: Diagnosis not present

## 2021-10-05 DIAGNOSIS — D692 Other nonthrombocytopenic purpura: Secondary | ICD-10-CM | POA: Diagnosis not present

## 2021-10-05 DIAGNOSIS — E782 Mixed hyperlipidemia: Secondary | ICD-10-CM

## 2021-10-05 DIAGNOSIS — Z1211 Encounter for screening for malignant neoplasm of colon: Secondary | ICD-10-CM

## 2021-10-05 DIAGNOSIS — Z6832 Body mass index (BMI) 32.0-32.9, adult: Secondary | ICD-10-CM

## 2021-10-05 DIAGNOSIS — J449 Chronic obstructive pulmonary disease, unspecified: Secondary | ICD-10-CM | POA: Diagnosis not present

## 2021-10-05 DIAGNOSIS — E66811 Obesity, class 1: Secondary | ICD-10-CM

## 2021-10-05 MED ORDER — CYANOCOBALAMIN 1000 MCG/ML IJ SOLN
1000.0000 ug | Freq: Once | INTRAMUSCULAR | Status: AC
  Administered 2021-10-05: 1000 ug via INTRAMUSCULAR

## 2021-10-05 MED ORDER — ASPIRIN 81 MG PO TBEC: 81.0000 mg | DELAYED_RELEASE_TABLET | Freq: Every day | ORAL | 12 refills | Status: AC

## 2021-10-05 NOTE — Assessment & Plan Note (Signed)
CBC today.  

## 2021-10-06 ENCOUNTER — Other Ambulatory Visit: Payer: Self-pay | Admitting: Student in an Organized Health Care Education/Training Program

## 2021-10-06 LAB — HEMOGLOBIN A1C
Hgb A1c MFr Bld: 4.9 % of total Hgb (ref <5.7)
Mean Plasma Glucose: 94 mg/dL
eAG (mmol/L): 5.2 mmol/L

## 2021-10-06 LAB — CBC WITH DIFFERENTIAL/PLATELET
Absolute Monocytes: 385 cells/uL (ref 200–950)
Basophils Absolute: 39 cells/uL (ref 0–200)
Basophils Relative: 0.7 %
Eosinophils Absolute: 61 cells/uL (ref 15–500)
Eosinophils Relative: 1.1 %
HCT: 34.1 % — ABNORMAL LOW (ref 35.0–45.0)
Hemoglobin: 11.1 g/dL — ABNORMAL LOW (ref 11.7–15.5)
Lymphs Abs: 1331 cells/uL (ref 850–3900)
MCH: 32.6 pg (ref 27.0–33.0)
MCHC: 32.6 g/dL (ref 32.0–36.0)
MCV: 100.3 fL — ABNORMAL HIGH (ref 80.0–100.0)
MPV: 10.1 fL (ref 7.5–12.5)
Monocytes Relative: 7 %
Neutro Abs: 3685 cells/uL (ref 1500–7800)
Neutrophils Relative %: 67 %
Platelets: 147 10*3/uL (ref 140–400)
RBC: 3.4 10*6/uL — ABNORMAL LOW (ref 3.80–5.10)
RDW: 11.8 % (ref 11.0–15.0)
Total Lymphocyte: 24.2 %
WBC: 5.5 10*3/uL (ref 3.8–10.8)

## 2021-10-06 LAB — COMPLETE METABOLIC PANEL WITH GFR
AG Ratio: 1.6 (calc) (ref 1.0–2.5)
ALT: 9 U/L (ref 6–29)
AST: 14 U/L (ref 10–35)
Albumin: 3.7 g/dL (ref 3.6–5.1)
Alkaline phosphatase (APISO): 72 U/L (ref 37–153)
BUN/Creatinine Ratio: 15 (calc) (ref 6–22)
BUN: 18 mg/dL (ref 7–25)
CO2: 27 mmol/L (ref 20–32)
Calcium: 9.5 mg/dL (ref 8.6–10.4)
Chloride: 107 mmol/L (ref 98–110)
Creat: 1.23 mg/dL — ABNORMAL HIGH (ref 0.50–1.05)
Globulin: 2.3 g/dL (calc) (ref 1.9–3.7)
Glucose, Bld: 83 mg/dL (ref 65–99)
Potassium: 3.8 mmol/L (ref 3.5–5.3)
Sodium: 143 mmol/L (ref 135–146)
Total Bilirubin: 0.3 mg/dL (ref 0.2–1.2)
Total Protein: 6 g/dL — ABNORMAL LOW (ref 6.1–8.1)
eGFR: 48 mL/min/{1.73_m2} — ABNORMAL LOW (ref >=60)

## 2021-10-06 LAB — LIPID PANEL
Cholesterol: 212 mg/dL — ABNORMAL HIGH (ref <200)
HDL: 46 mg/dL — ABNORMAL LOW (ref >=50)
LDL Cholesterol (Calc): 142 mg/dL (calc) — ABNORMAL HIGH
Non-HDL Cholesterol (Calc): 166 mg/dL (calc) — ABNORMAL HIGH (ref <130)
Total CHOL/HDL Ratio: 4.6 (calc) (ref <5.0)
Triglycerides: 119 mg/dL (ref <150)

## 2021-10-06 LAB — TSH+FREE T4: TSH W/REFLEX TO FT4: 1.78 mIU/L (ref 0.40–4.50)

## 2021-10-15 MED ORDER — ATORVASTATIN CALCIUM 10 MG PO TABS
10.0000 mg | ORAL_TABLET | Freq: Every day | ORAL | 2 refills | Status: DC
Start: 2021-10-15 — End: 2022-08-16

## 2021-10-15 NOTE — Addendum Note (Signed)
Addended by: Jearld Fenton on: 10/15/2021 01:47 PM   Modules accepted: Orders

## 2021-10-25 ENCOUNTER — Ambulatory Visit: Payer: Self-pay | Admitting: *Deleted

## 2021-10-25 NOTE — Telephone Encounter (Signed)
Would recommend she make an appointment if her symptoms persist.  To the ER if they worsen.

## 2021-10-25 NOTE — Telephone Encounter (Signed)
I returned her call regarding headaches and nausea.   It came on [place]. Night.   Excedrin migraine usually takes care of it and it is helping but I still have a headache.   I have nausea and some dry heaves on occasion.    I've not eaten anything in 3 days.    My main concern is the headache.   It hasn't stopped.   I've had migraines.   Sumatriptan is what I take if the Excedrin migraine doesn't help.   It helps but the headache hasn't gone away.     Should I try to eat something?   I know that can help the nausea.  I've been afraid to eat.  I did eat some rice last night and it stayed down.  I went over some soft foods for her to eat, rice, potatoes, soda crackers, applesauce, etc.      I drink a lot of Pepsi's.   I haven't had any since Thursday.   I'm drinking a lot of caffeine.  5 Pepsi's a day.     I suddenly stopped drinking the Pepsi's last Thur.   That may be what is causing the headache is stopping the caffeine suddenly.   The doctor told me to cut back on all the Pepsi so I stopped it all last Thur.  By Demetrius Charity. Night I had this awful headache.     I let her know the suddenly stopping caffeine can cause a headache as part of the withdrawal.  I encouraged her to drink plenty of water to rehydrate.  She is drinking mostly regular Ginger Ale.   She was agreeable to trying to eat soft foods and more water for the next couple of days and see if that helps the headache.   She wanted to wait on making an appt. And see how she responded to eating and more water.     I let her know to call us back if the headache had not resolved or gets worse, has dizziness or starts vomiting.   She verbalized understanding and was agreeable to this plan.     (No protocol questions used due to an IT issue which is being worked on.  That why this is free texted).

## 2021-10-25 NOTE — Telephone Encounter (Signed)
Tried calling no answer.  PEC Please advise pt when they call back.   Thanks,   -Mickel Baas

## 2021-10-25 NOTE — Telephone Encounter (Signed)
Message from Roslynn Amble sent at 10/25/2021  8:42 AM EDT  Summary: Headache, Nausea   Patient called in with headache and nausea for a few days. She continues to have a headache but the nausea comes and goes. The patient requesting what she should do about the headache and if it is something should should worry about? Please assist patient further           Call History   Type Contact Phone/Fax User  10/25/2021 08:38 AM EDT Phone ([address] [place], [place] M (Self) [phone] Lemmie Evens) Romeo Rabon

## 2021-10-25 NOTE — Telephone Encounter (Signed)
Pt returned out call. Pt states that she is feeling much better. She has had crackers, water, ginger ale today with out any problems.  Tonight she plans to have applesauce and rice for dinner. Her HA has mostly resolved.  She reports very itchy ears.  PT had an appt on the 25th which needed to be cancelled d/t a conflict with a pain clinic appt.  Appt made for tomorrow with Dr. Parks Ranger

## 2021-10-26 ENCOUNTER — Ambulatory Visit (INDEPENDENT_AMBULATORY_CARE_PROVIDER_SITE_OTHER): Payer: Medicare HMO | Admitting: Family Medicine

## 2021-10-26 ENCOUNTER — Encounter: Payer: Self-pay | Admitting: Family Medicine

## 2021-10-26 VITALS — BP 100/68 | HR 81 | Ht 68.0 in | Wt 209.2 lb

## 2021-10-26 DIAGNOSIS — G43909 Migraine, unspecified, not intractable, without status migrainosus: Secondary | ICD-10-CM

## 2021-10-26 DIAGNOSIS — R110 Nausea: Secondary | ICD-10-CM

## 2021-10-26 MED ORDER — ONDANSETRON 4 MG PO TBDP: 4.0000 mg | ORAL_TABLET | Freq: Three times a day (TID) | ORAL | 1 refills | Status: DC | PRN

## 2021-10-26 MED ORDER — RIZATRIPTAN BENZOATE 10 MG PO TBDP: 10.0000 mg | ORAL_TABLET | ORAL | 2 refills | Status: AC | PRN

## 2021-10-26 NOTE — Patient Instructions (Addendum)
Thank you for coming to the office today.  Stop Sumatriptan for migraine  Start Rizatriptan dissolving tablet '10mg'$  take one at first sign of migraine headache may repeat within 2 hours then that is it for 24 hours.  Also may take the Zofran dissolving nausea medication.  If this is still not working - we can order the [place] again, and make sure we try to get it covered this time.  Please schedule a Follow-up Appointment to: Return if symptoms worsen or fail to improve.  If you have any other questions or concerns, please feel free to call the office or send a message through [facility]. You may also schedule an earlier appointment if necessary.  Additionally, you may be receiving a survey about your experience at our office within a few days to 1 week by e-mail or mail. We value your feedback.  [name], DO [facility]

## 2021-10-26 NOTE — Progress Notes (Signed)
 Subjective:    Patient ID: Pamela Romero, female    DOB: 05/07/1950, 71 y.o.   MRN: [id number]  Pamela Romero is a 71 y.o. female presenting on 10/26/2021 for Ear Pain, Headache, and Nausea  PCP Regina Baity, FNP   HPI  Episodic Migraines Known long term history of migraines Recently 1 week ago on weds last week 7/12, she had headache migraine, poor PO intake and felt nausea with some dry heaves, and she has not been able to keep anything down.  She took excedrin migraine without relief. And then she took Sumatriptan and repeat in 2 hours and did not resolve.  Still with episodic migraine headaches, asking about new medication option  Previously Pain Management ordered Ubrelvy 04/2021 was not covered, cost >800$      08/06/2021    2:35 PM 03/16/2021    8:31 AM 02/24/2021   10:48 AM  Depression screen PHQ 2/9  Decreased Interest 2 0 0  Down, Depressed, Hopeless 2 1 0  PHQ - 2 Score 4 1 0  Altered sleeping 3 3   Tired, decreased energy 3 3   Change in appetite 2 2   Feeling bad or failure about yourself  2 1   Trouble concentrating 1 3   Moving slowly or fidgety/restless 2 0   Suicidal thoughts 0 0   PHQ-9 Score 17 13   Difficult doing work/chores Not difficult at all Not difficult at all     Social History   Tobacco Use   Smoking status: Former    Packs/day: 0.50    Years: 50.00    Total pack years: 25.00    Types: Cigarettes    Quit date: 05/11/2018    Years since quitting: 3.4   Smokeless tobacco: Former  Vaping Use   Vaping Use: Never used  Substance Use Topics   Alcohol use: No   Drug use: No    Review of Systems Per HPI unless specifically indicated above     Objective:    BP 100/68   Pulse 81   Ht 5' 8" (1.727 m)   Wt 209 lb 3.2 oz (94.9 kg)   SpO2 100%   BMI 31.81 kg/m   Wt Readings from Last 3 Encounters:  10/26/21 209 lb 3.2 oz (94.9 kg)  10/05/21 215 lb (97.5 kg)  09/15/21 214 lb (97.1 kg)    Physical Exam Vitals and nursing  note reviewed.  Constitutional:      General: She is not in acute distress.    Appearance: Normal appearance. She is well-developed. She is not diaphoretic.     Comments: Well-appearing, comfortable, cooperative  HENT:     Head: Normocephalic and atraumatic.  Eyes:     General:        Right eye: No discharge.        Left eye: No discharge.     Conjunctiva/sclera: Conjunctivae normal.  Cardiovascular:     Rate and Rhythm: Normal rate.  Pulmonary:     Effort: Pulmonary effort is normal.  Skin:    General: Skin is warm and dry.     Findings: No erythema or rash.  Neurological:     Mental Status: She is alert and oriented to person, place, and time.  Psychiatric:        Mood and Affect: Mood normal.        Behavior: Behavior normal.        Thought Content: Thought content normal.       Comments: Well groomed, good eye contact, normal speech and thoughts    Results for orders placed or performed in visit on 10/05/21  TSH + free T4  Result Value Ref Range   TSH W/REFLEX TO FT4 1.78 0.40 - 4.50 mIU/L  Lipid panel  Result Value Ref Range   Cholesterol 212 (H) <200 mg/dL   HDL 46 (L) > OR = 50 mg/dL   Triglycerides 119 <150 mg/dL   LDL Cholesterol (Calc) 142 (H) mg/dL (calc)   Total CHOL/HDL Ratio 4.6 <5.0 (calc)   Non-HDL Cholesterol (Calc) 166 (H) <130 mg/dL (calc)  COMPLETE METABOLIC PANEL WITH GFR  Result Value Ref Range   Glucose, Bld 83 65 - 99 mg/dL   BUN 18 7 - 25 mg/dL   Creat 1.23 (H) 0.50 - 1.05 mg/dL   eGFR 48 (L) > OR = 60 mL/min/1.73m2   BUN/Creatinine Ratio 15 6 - 22 (calc)   Sodium 143 135 - 146 mmol/L   Potassium 3.8 3.5 - 5.3 mmol/L   Chloride 107 98 - 110 mmol/L   CO2 27 20 - 32 mmol/L   Calcium 9.5 8.6 - 10.4 mg/dL   Total Protein 6.0 (L) 6.1 - 8.1 g/dL   Albumin 3.7 3.6 - 5.1 g/dL   Globulin 2.3 1.9 - 3.7 g/dL (calc)   AG Ratio 1.6 1.0 - 2.5 (calc)   Total Bilirubin 0.3 0.2 - 1.2 mg/dL   Alkaline phosphatase (APISO) 72 37 - 153 U/L   AST 14 10 - 35  U/L   ALT 9 6 - 29 U/L  Hemoglobin A1c  Result Value Ref Range   Hgb A1c MFr Bld 4.9 <5.7 % of total Hgb   Mean Plasma Glucose 94 mg/dL   eAG (mmol/L) 5.2 mmol/L  CBC with Differential/Platelet  Result Value Ref Range   WBC 5.5 3.8 - 10.8 Thousand/uL   RBC 3.40 (L) 3.80 - 5.10 Million/uL   Hemoglobin 11.1 (L) 11.7 - 15.5 g/dL   HCT 34.1 (L) 35.0 - 45.0 %   MCV 100.3 (H) 80.0 - 100.0 fL   MCH 32.6 27.0 - 33.0 pg   MCHC 32.6 32.0 - 36.0 g/dL   RDW 11.8 11.0 - 15.0 %   Platelets 147 140 - 400 Thousand/uL   MPV 10.1 7.5 - 12.5 fL   Neutro Abs 3,685 1,500 - 7,800 cells/uL   Lymphs Abs 1,331 850 - 3,900 cells/uL   Absolute Monocytes 385 200 - 950 cells/uL   Eosinophils Absolute 61 15 - 500 cells/uL   Basophils Absolute 39 0 - 200 cells/uL   Neutrophils Relative % 67 %   Total Lymphocyte 24.2 %   Monocytes Relative 7.0 %   Eosinophils Relative 1.1 %   Basophils Relative 0.7 %      Assessment & Plan:   Problem List Items Addressed This Visit     Episodic migraine - Primary   Relevant Medications   rizatriptan (MAXALT-MLT) 10 MG disintegrating tablet   Other Visit Diagnoses     Nausea       Relevant Medications   ondansetron (ZOFRAN-ODT) 4 MG disintegrating tablet       Stop Sumatriptan for migraine  Start Rizatriptan dissolving tablet 10mg take one at first sign of migraine headache may repeat within 2 hours then that is it for 24 hours.  Also may take the Zofran dissolving nausea medication.  If this is still not working - we can order the Ubrelvy again, and make sure we try to get   it covered this time. Note that previous Ubrelvy ordered 04/2021 by pain management does not appear to be covered, even though it is preferred option but does req PA, should have tried 2 x triptan medications first before approval.  Meds ordered this encounter  Medications   rizatriptan (MAXALT-MLT) 10 MG disintegrating tablet    Sig: Take 1 tablet (10 mg total) by mouth as needed for  migraine. May repeat in 2 hours if needed    Dispense:  10 tablet    Refill:  2   ondansetron (ZOFRAN-ODT) 4 MG disintegrating tablet    Sig: Take 1 tablet (4 mg total) by mouth every 8 (eight) hours as needed for nausea or vomiting.    Dispense:  20 tablet    Refill:  1      Follow up plan: Return if symptoms worsen or fail to improve.   Alexander Karamalegos, DO [facility] 10/26/2021, 3:41 PM 

## 2021-11-02 ENCOUNTER — Ambulatory Visit: Payer: Medicare HMO | Admitting: Internal Medicine

## 2021-11-02 ENCOUNTER — Encounter: Payer: Self-pay | Admitting: Student in an Organized Health Care Education/Training Program

## 2021-11-02 ENCOUNTER — Ambulatory Visit
Payer: Medicare HMO | Attending: Student in an Organized Health Care Education/Training Program | Admitting: Student in an Organized Health Care Education/Training Program

## 2021-11-02 ENCOUNTER — Other Ambulatory Visit: Payer: Self-pay | Admitting: Internal Medicine

## 2021-11-02 VITALS — BP 113/87 | HR 85 | Temp 97.3°F | Resp 16 | Ht 68.5 in | Wt 200.0 lb

## 2021-11-02 DIAGNOSIS — M1712 Unilateral primary osteoarthritis, left knee: Secondary | ICD-10-CM | POA: Insufficient documentation

## 2021-11-02 DIAGNOSIS — M170 Bilateral primary osteoarthritis of knee: Secondary | ICD-10-CM | POA: Diagnosis not present

## 2021-11-02 DIAGNOSIS — M5416 Radiculopathy, lumbar region: Secondary | ICD-10-CM | POA: Diagnosis not present

## 2021-11-02 DIAGNOSIS — G8929 Other chronic pain: Secondary | ICD-10-CM | POA: Diagnosis not present

## 2021-11-02 DIAGNOSIS — G894 Chronic pain syndrome: Secondary | ICD-10-CM | POA: Diagnosis not present

## 2021-11-02 DIAGNOSIS — M722 Plantar fascial fibromatosis: Secondary | ICD-10-CM | POA: Insufficient documentation

## 2021-11-02 MED ORDER — HYDROCODONE-ACETAMINOPHEN 7.5-325 MG PO TABS: 1.0000 | ORAL_TABLET | Freq: Four times a day (QID) | ORAL | 0 refills | Status: AC | PRN

## 2021-11-02 MED ORDER — GABAPENTIN 300 MG PO CAPS: 300.0000 mg | ORAL_CAPSULE | Freq: Every day | ORAL | 2 refills | Status: DC

## 2021-11-02 MED ORDER — HYDROCODONE-ACETAMINOPHEN 7.5-325 MG PO TABS: 1.0000 | ORAL_TABLET | Freq: Four times a day (QID) | ORAL | 0 refills | Status: DC | PRN

## 2021-11-02 NOTE — Progress Notes (Signed)
Nursing Pain Medication Assessment:  Safety precautions to be maintained throughout the outpatient stay will include: orient to surroundings, keep bed in low position, maintain call bell within reach at all times, provide assistance with transfer out of bed and ambulation.  Medication Inspection Compliance: Pill count conducted under aseptic conditions, in front of the patient. Neither the pills nor the bottle was removed from the patient's sight at any time. Once count was completed pills were immediately returned to the patient in their original bottle.  Medication: Hydrocodone/APAP Pill/Patch Count:  7 of 105 pills remain Pill/Patch Appearance: Markings consistent with prescribed medication Bottle Appearance: Standard pharmacy container. Clearly labeled. Filled Date: 6 / 10 / 2023 Last Medication intake:  Today

## 2021-11-02 NOTE — Progress Notes (Signed)
PROVIDER NOTE: Information contained herein reflects review and annotations entered in association with encounter. Interpretation of such information and data should be left to medically-trained personnel. Information provided to patient can be located elsewhere in the medical record under "Patient Instructions". Document created using STT-dictation technology, any transcriptional errors that may result from process are unintentional.    Patient: Pamela Romero  Service Category: E/M  Provider: Gillis Santa, MD  DOB: 1950-08-28  DOS: 11/02/2021  Specialty: Interventional Pain Management  MRN: [id number]  Setting: Ambulatory outpatient  PCP: Jearld Fenton, NP  Type: Established Patient    Referring Provider: [name], FNP  Location: Office  Delivery: Face-to-face     HPI  [name], a 71 y.o. year old female, is here today because of her Primary osteoarthritis of left knee [M17.12]. [name] primary complain today is Knee Pain (left)  Last encounter: My last encounter with her was on 08/03/21 Pertinent problems: [name] has Major depressive disorder, recurrent, mild ([place]); Bilateral primary osteoarthritis of knee; and Osteoporosis on their pertinent problem list. Pain Assessment: Severity of Chronic pain is reported as a 6 /10. Location: Knee Left/down to left foot. Onset: More than a month ago. Quality: Cramping, Sharp. Timing: Constant. Modifying factor(s): meds. Vitals:  height is 5' 8.5" (1.74 m) and weight is 200 lb (90.7 kg). Her temporal temperature is 97.3 F (36.3 C) (abnormal). Her blood pressure is 113/87 and her pulse is 85. Her respiration is 16 and oxygen saturation is 100%.   Reason for encounter:  MM and worsening knee pain  Refill of hydrocodone as below.  No change in dose. Patient requesting repeat long-acting Kenalog injection for both knees.  She had long-acting Kenalog previously done in November 2022 that provided her to her with 75 to 80% pain  relief for about 6 months and now she is having increased pain especially with weightbearing for the left knee.  She states that her mother-in-law has moved in who is ill and having difficulty taking care of herself after a fall.  She states that this has been stressful as she has been having to exert herself more to take care of her.  We will also renew our annual urine toxicology screen for medication compliance monitoring.   Pharmacotherapy Assessment  Analgesic: Hydrocodone 7.5 mg 3 times daily as needed with an extra quantity 15/month (#105)For severe breakthrough pain    Monitoring: [place] PMP: PDMP reviewed during this encounter.       Pharmacotherapy: No side-effects or adverse reactions reported. Compliance: No problems identified. Effectiveness: Clinically acceptable.  UDS:  Summary  Date Value Ref Range Status  11/04/2020 Note  Final    Comment:    ==================================================================== [employer] Select 13 (MW) ==================================================================== Test                             Result       Flag       Units  Drug Present and Declared for Prescription Verification   Hydrocodone                    982          EXPECTED   ng/mg creat   Hydromorphone                  186          EXPECTED   ng/mg creat   Dihydrocodeine  211          EXPECTED   ng/mg creat   Norhydrocodone                 1188         EXPECTED   ng/mg creat    Sources of hydrocodone include scheduled prescription medications.    Hydromorphone, dihydrocodeine and norhydrocodone are expected    metabolites of hydrocodone. Hydromorphone and dihydrocodeine are    also available as scheduled prescription medications.  ==================================================================== Test                      Result    Flag   Units      Ref Range   Creatinine              66               mg/dL       >=20 ==================================================================== Declared Medications:  The flagging and interpretation on this report are based on the  following declared medications.  Unexpected results may arise from  inaccuracies in the declared medications.   **Note: The testing scope of this panel includes these medications:   Hydrocodone (Norco)   **Note: The testing scope of this panel does not include the  following reported medications:   Acetaminophen (Norco)  Albuterol (Ventolin HFA)  Esomeprazole (Nexium)  Gabapentin (Neurontin)  Hydrochlorothiazide (Zestoretic)  Levothyroxine (Synthroid)  Lisinopril (Zestoretic)  Omeprazole (Prilosec) ==================================================================== For clinical consultation, please call [phone]. ====================================================================       ROS  Constitutional: Denies any fever or chills Gastrointestinal: No reported hemesis, hematochezia, vomiting, or acute GI distress Musculoskeletal:  Left knee pain right knee pain as well Neurological: No reported episodes of acute onset apraxia, aphasia, dysarthria, agnosia, amnesia, paralysis, loss of coordination, or loss of consciousness  Medication Review  HYDROcodone-acetaminophen, albuterol, aspirin EC, atorvastatin, cyanocobalamin, esomeprazole, gabapentin, levothyroxine, lisinopril-hydrochlorothiazide, ondansetron, rizatriptan, and sertraline  History Review  Allergy: [name] has no allergies on file. Drug: [name]  reports no history of drug use. Alcohol:  reports no history of alcohol use. Tobacco:  reports that she quit smoking about 3 years ago. Her smoking use included cigarettes. She has a 25.00 pack-year smoking history. She has quit using smokeless tobacco. Social: [name]  reports that she quit smoking about 3 years ago. Her smoking use included cigarettes. She has a 25.00 pack-year smoking  history. She has quit using smokeless tobacco. She reports that she does not drink alcohol and does not use drugs. Medical:  has a past medical history of Abnormal EKG, CAD (coronary artery disease), Cancer ([place]), COPD (chronic obstructive pulmonary disease) ([place]), Endometrial adenocarcinoma ([place]) (2011), Hyperlipidemia, Hypertension, Osteoarthritis, Reflux gastritis, Thyroid disease, tobacco abuse, and Vaginal delivery. Surgical: [name]  has a past surgical history that includes Thyroidectomy (2005); Cardiac catheterization (2006); Total abdominal hysterectomy; Breast surgery (Right, March 2014); Breast cyst aspiration (Right, 2014); and Esophagogastroduodenoscopy (N/A, 04/07/2020). Family: family history includes Alcohol abuse in her father, maternal aunt, and mother; Cancer in her brother, brother, brother, and maternal aunt; Heart disease in her brother.  Laboratory Chemistry Profile   Renal Lab Results  Component Value Date   BUN 18 10/05/2021   CREATININE 1.23 (H) 10/05/2021   BCR 15 10/05/2021   GFR 60.34 01/21/2015   GFRAA 61 01/02/2020   GFRNONAA >60 04/07/2020    Hepatic Lab Results  Component Value Date   AST 14 10/05/2021   ALT 9  10/05/2021   ALBUMIN 4.4 08/29/2017   ALKPHOS 126 (H) 08/29/2017   HCVAB NEGATIVE 01/21/2015    Electrolytes Lab Results  Component Value Date   NA 143 10/05/2021   K 3.8 10/05/2021   CL 107 10/05/2021   CALCIUM 9.5 10/05/2021   MG 1.3 (L) 09/15/2021    Bone Lab Results  Component Value Date   VD25OH 10 (L) 08/06/2021    Inflammation (CRP: Acute Phase) (ESR: Chronic Phase) Lab Results  Component Value Date   ESRSEDRATE 19 03/30/2012         Note: Above Lab results reviewed.   Physical Exam  General appearance: Well nourished, well developed, and well hydrated. In no apparent acute distress Mental status: Alert, oriented x 3 (person, place, & time)       Respiratory: No evidence of acute respiratory distress Eyes:  PERLA Vitals: BP 113/87   Pulse 85   Temp (!) 97.3 F (36.3 C) (Temporal)   Resp 16   Ht 5' 8.5" (1.74 m)   Wt 200 lb (90.7 kg)   SpO2 100%   BMI 29.97 kg/m  BMI: Estimated body mass index is 29.97 kg/m as calculated from the following:   Height as of this encounter: 5' 8.5" (1.74 m).   Weight as of this encounter: 200 lb (90.7 kg). Ideal: Ideal body weight: 65 kg (143 lb 6.5 oz) Adjusted ideal body weight: 75.3 kg (166 lb 0.7 oz)  Limited range of motion of left knee, pain with weightbearing Arthropathic pain pattern of left knee pain right knee  Assessment   Status Diagnosis  Having a Flare-up Having a Flare-up Having a Flare-up 1. Primary osteoarthritis of left knee   2. Bilateral primary osteoarthritis of knee   3. Chronic pain syndrome   4. Chronic radicular lumbar pain (left)   5. Plantar fascia syndrome          Plan of Care   Requested Prescriptions   Signed Prescriptions Disp Refills   HYDROcodone-acetaminophen (NORCO) 7.5-325 MG tablet 105 tablet 0    Sig: Take 1 tablet by mouth every 6 (six) hours as needed for severe pain. Must last 30 days.   HYDROcodone-acetaminophen (NORCO) 7.5-325 MG tablet 105 tablet 0    Sig: Take 1 tablet by mouth every 6 (six) hours as needed for severe pain. Must last 30 days.   HYDROcodone-acetaminophen (NORCO) 7.5-325 MG tablet 105 tablet 0    Sig: Take 1 tablet by mouth every 6 (six) hours as needed for severe pain. Must last 30 days.   gabapentin (NEURONTIN) 300 MG capsule 180 capsule 2    Sig: Take 1-2 capsules (300-600 mg total) by mouth at bedtime.    Orders Placed This Encounter  Procedures   KNEE INJECTION    Local Anesthetic & Steroid injection.    Standing Status:   Future    Standing Expiration Date:   02/02/2022    Scheduling Instructions:     Side: Bilateral     Sedation: None     Timeframe: As soon as schedule allows    Order Specific Question:   Where will this procedure be performed?    Answer:    [facility]   Tox[employer] Select 13 (MW), Urine    Volume: 30 ml(s). Minimum 3 ml of urine is needed. Document temperature of fresh sample. Indications: Long term (current) use of opiate analgesic (W44.628)    Order Specific Question:   Release to patient    Answer:   Immediate  Follow-up plan:   Return in about 1 week (around 11/09/2021) for B/L IA knee steroid (Long acting Kennalog), in clinic NS.     Status post left genicular RFA 04/16/2018, status post right genicular RFA 03/12/2018.  Provided her with benefit.  Status post left knee intra-articular steroid injection #3 on 11/21/2018.   hyalgan left knee #1 on 04/17/19, #2 05/22/19, #3 07/03/2019, #4 08/28/19, #5 on 09/25/19             Recent Visits No visits were found meeting these conditions. Showing recent visits within past 90 days and meeting all other requirements Today's Visits Date Type Provider Dept  11/02/21 Office Visit Gillis Santa, MD [facility]  Showing today's visits and meeting all other requirements Future Appointments Date Type Provider Dept  11/10/21 Appointment Gillis Santa, MD [facility]  01/25/22 Appointment Gillis Santa, MD [facility]  Showing future appointments within next 90 days and meeting all other requirements  I discussed the assessment and treatment plan with the patient. The patient was provided an opportunity to ask questions and all were answered. The patient agreed with the plan and demonstrated an understanding of the instructions.  Patient advised to call back or seek an in-person evaluation if the symptoms or condition worsens.  Duration of encounter: 46minutes.  Note by: Gillis Santa, MD Date: 11/02/2021; Time: 10:52 AM

## 2021-11-03 NOTE — Telephone Encounter (Signed)
Discontinued by another provider on 11/02/21 OV, will refuse this request.   Requested Prescriptions  Pending Prescriptions Disp Refills  . Vitamin D, Ergocalciferol, (DRISDOL) 1.25 MG (50000 UNIT) CAPS capsule [Pharmacy Med Name: VITAMIN D2 1.'25MG'$ (50,000 UNIT)] 12 capsule 0    Sig: TAKE 1 CAPSULE (50,000 UNITS TOTAL) BY MOUTH ONCE A WEEK. FOR 12 WEEKS. THEN START OTC VITAMIN D3 2,000 UNIT DAILY.     Endocrinology:  Vitamins - Vitamin D Supplementation 2 Failed - 11/02/2021  2:32 PM      Failed - Manual Review: Route requests for 50,000 IU strength to the provider      Failed - Vitamin D in normal range and within 360 days    VITD  Date Value Ref Range Status  05/14/2014 26.66 (L) 30.00 - 100.00 ng/mL Final   Vit D, 25-Hydroxy  Date Value Ref Range Status  08/06/2021 10 (L) 30 - 100 ng/mL Final    Comment:    Vitamin D Status         25-OH Vitamin D: . Deficiency:                    <20 ng/mL Insufficiency:             20 - 29 ng/mL Optimal:                 > or = 30 ng/mL . For 25-OH Vitamin D testing on patients on  D2-supplementation and patients for whom quantitation  of D2 and D3 fractions is required, the [employer](TM) 25-OH VIT D, (D2,D3), LC/MS/MS is recommended: order  code [id number] (patients >47yr). See Note 1 . Note 1 . For additional information, please refer to  http://education.Quest[facility].com/faq/FAQ199  (This link is being provided for informational/ educational purposes only.)          Passed - Ca in normal range and within 360 days    Calcium  Date Value Ref Range Status  10/05/2021 9.5 8.6 - 10.4 mg/dL Final         Passed - Valid encounter within last 12 months    Recent Outpatient Visits          1 week ago Episodic migraine   [place]   4 weeks ago [facility] general [facility] medical [facility] with abnormal [facility]   1 month [facility] B12  def[facility]   2 months ago B12 deficiency   [facility]   2 months ago [facility] fatigue   [facility]      Future Appointments            In 5 months [facility]

## 2021-11-04 ENCOUNTER — Other Ambulatory Visit: Payer: Self-pay | Admitting: Student in an Organized Health Care Education/Training Program

## 2021-11-04 DIAGNOSIS — G894 Chronic pain syndrome: Secondary | ICD-10-CM

## 2021-11-04 DIAGNOSIS — G8929 Other chronic pain: Secondary | ICD-10-CM

## 2021-11-06 LAB — TOXASSURE SELECT 13 (MW), URINE

## 2021-11-10 ENCOUNTER — Encounter: Payer: Self-pay | Admitting: Student in an Organized Health Care Education/Training Program

## 2021-11-10 ENCOUNTER — Ambulatory Visit
Payer: Medicare HMO | Attending: Student in an Organized Health Care Education/Training Program | Admitting: Student in an Organized Health Care Education/Training Program

## 2021-11-10 DIAGNOSIS — M1712 Unilateral primary osteoarthritis, left knee: Secondary | ICD-10-CM | POA: Insufficient documentation

## 2021-11-10 DIAGNOSIS — M170 Bilateral primary osteoarthritis of knee: Secondary | ICD-10-CM | POA: Insufficient documentation

## 2021-11-10 MED ORDER — TRIAMCINOLONE ACETONIDE 32 MG IX SRER
32.0000 mg | Freq: Once | INTRA_ARTICULAR | Status: AC
  Administered 2021-11-10: 32 mg via INTRA_ARTICULAR
  Filled 2021-11-10: qty 5

## 2021-11-10 MED ORDER — LIDOCAINE HCL 2 % IJ SOLN
20.0000 mL | Freq: Once | INTRAMUSCULAR | Status: AC
  Administered 2021-11-10: 400 mg
  Filled 2021-11-10: qty 20

## 2021-11-10 NOTE — Progress Notes (Signed)
Safety precautions to be maintained throughout the outpatient stay will include: orient to surroundings, keep bed in low position, maintain call bell within reach at all times, provide assistance with transfer out of bed and ambulation.  

## 2021-11-10 NOTE — Progress Notes (Signed)
PROVIDER NOTE: Interpretation of information contained herein should be left to medically-trained personnel. Specific patient instructions are provided elsewhere under "Patient Instructions" section of medical record. This document was created in part using STT-dictation technology, any transcriptional errors that may result from this process are unintentional.  Patient: Pamela Romero Type: Established DOB: 1950-10-09 MRN: [id number] PCP: Jearld Fenton, NP  Service: Procedure DOS: 11/10/2021 Setting: Ambulatory Location: Ambulatory outpatient facility Delivery: Face-to-face Provider: Gillis Santa, MD Specialty: Interventional Pain Management Specialty designation: 09 Location: Outpatient facility Ref. Prov.: Gillis Santa, MD   Procedure [facility] Interventional Pain Management )    Procedure: Steroid Knee Injection (LONG ACTING Kennalog) #3 (#2 done 02/24/21) Laterality: Bilateral (-50) Level: Intra-articular  No.: R2 L3 Series: n/a Purpose: Therapeutic Indications: Knee arthralgia associated to osteoarthritis of the knee   Imaging: None required (CPT-20610) Analgesia: Skin infiltration w/ local anesthetics Sedation: None  NAS-11 score:   Pre-procedure: 5 /10   Post-procedure: 0-No pain/10      1. Primary osteoarthritis of left knee   2. Bilateral primary osteoarthritis of knee    Pre-Procedure Preparation  Monitoring: As per clinic protocol.  Risk Assessment: Vitals:  ION:[id number] body mass index is 30.27 kg/m as calculated from the following:   Height as of this encounter: 5' 8.5" (1.74 m).   Weight as of this encounter: 202 lb (91.6 kg)., Rate:87 , BP:(!) 84/60, Resp:16, Temp:(!) 97 F (36.1 C), SpO2:98 %  Allergies: She has no allergies on file.  Precautions: None required  Blood-thinner(s): None at this time  Coagulopathies: Reviewed. None identified.   Active Infection(s): Reviewed. None identified. [name] is afebrile   Location setting: Exam  room Position: Sitting w/ knee bent 90 degrees Safety Precautions: Patient was assessed for positional comfort and pressure points before starting the procedure. Prepping solution: DuraPrep (Iodine Povacrylex [0.7% available iodine] and Isopropyl Alcohol, 74% w/w) Prep Area: Entire knee region Approach: percutaneous, just above the tibial plateau, medial to the infrapatellar tendon. Intended target: Intra-articular knee space Materials: Tray: Block Needle(s): Regular Qty: 1/side Length: 1.5-inch Gauge: 25G   Meds ordered this encounter  Medications   Triamcinolone Acetonide (ZILRETTA) intra-articular injection 32 mg    Maintain refrigerated.  Prepared suspension may be stored up to 4 hours at ambient conditions.   Triamcinolone Acetonide (ZILRETTA) intra-articular injection 32 mg    Maintain refrigerated.  Prepared suspension may be stored up to 4 hours at ambient conditions.   lidocaine (XYLOCAINE) 2 % (with pres) injection 400 mg     No orders of the defined types were placed in this encounter.     Time-out: 0935 I initiated and conducted the "Time-out" before starting the procedure, as per protocol. The patient was asked to participate by confirming the accuracy of the "Time Out" information. Verification of the correct person, site, and procedure were performed and confirmed by me, the nursing staff, and the patient. "Time-out" conducted as per Joint Commission's Universal Protocol (UP.01.01.01). Procedure checklist: Completed   H&P (Pre-op  Assessment)  [name] is a 71 y.o. (year old), female patient, seen today for interventional treatment. She  has a past surgical history that includes Thyroidectomy (2005); Cardiac catheterization (2006); Total abdominal hysterectomy; Breast surgery (Right, March 2014); Breast cyst aspiration (Right, 2014); and Esophagogastroduodenoscopy (N/A, 04/07/2020). [name] has a current medication list which includes the following  prescription(s): albuterol, aspirin ec, atorvastatin, cyanocobalamin, esomeprazole, gabapentin, hydrocodone-acetaminophen, [START ON 12/04/2021] hydrocodone-acetaminophen, [START ON 01/03/2022] hydrocodone-acetaminophen, levothyroxine, lisinopril-hydrochlorothiazide, ondansetron, rizatriptan, and sertraline. Her primarily concern  today is the Knee Pain (bilat)   She has no allergies on file.   Last encounter: My last encounter with her was on 11/03/2020. Pertinent problems: [name] has Major depressive disorder, recurrent, mild ([place]); Bilateral primary osteoarthritis of knee; and Osteoporosis on their pertinent problem list. Pain Assessment: Severity of Chronic pain is reported as a 5 /10. Location: Knee Left/down to left foot. Onset: More than a month ago. Quality: Cramping. Timing: Constant. Modifying factor(s): meds. Vitals:  height is 5' 8.5" (1.74 m) and weight is 202 lb (91.6 kg). Her temporal temperature is 97 F (36.1 C) (abnormal). Her blood pressure is 77/66 (abnormal) and her pulse is 89. Her respiration is 16 and oxygen saturation is 98%.   Reason for encounter: "interventional pain management therapy due pain of at least four (4) weeks in duration, with failure to respond and/or inability to tolerate more conservative care.     Related imaging: Knee-L MR wo contrast:  Results for orders placed during the hospital encounter of 09/01/17  MR KNEE LEFT WO CONTRAST  Narrative CLINICAL DATA:  Persistent anterior knee pain for 1.5 months.  EXAM: MRI OF THE LEFT KNEE WITHOUT CONTRAST  TECHNIQUE: Multiplanar, multisequence MR imaging of the knee was performed. No intravenous contrast was administered.  COMPARISON:  None.  FINDINGS: MENISCI  Medial meniscus: There is an extensive horizontal tear posterior horn extending from the superior surface to the periphery. The midbody is degenerated and peripherally subluxed.  Lateral meniscus: Extensive horizontal tear involving the  anterior horn and midbody with small parameniscal cysts at the level of the midbody best seen on image 34 of series 7 and series 31 of series 5.  LIGAMENTS  Cruciates:  Intact.  Collaterals:  Intact.  CARTILAGE  Patellofemoral: Partial-thickness cartilage loss of the superior aspect of the medial facet of the patella.  Medial: Denuding of the articular cartilage of the medial tibial plateau with subcortical edema and subcortical cyst formation in the periphery of the tibial plateau. Thinning of the articular cartilage of the femoral condyle.  Lateral: Focal full-thickness cartilage loss in the posterior aspect of the lateral tibial plateau.  Joint: Trace joint effusion. Normal Hoffa's fat pad. No plical thickening.  Popliteal Fossa:  Small Baker's cyst.  Intact popliteus tendon.  Extensor Mechanism:  Normal.  Bones: Moderate marginal osteophytes in the medial compartment. Tiny marginal osteophytes in the lateral compartment.  Other: None  IMPRESSION: 1. Extensive horizontal tear of the posterior horn of the medial meniscus. 2. Extensive horizontal tear of the anterior horn and midbody of the lateral meniscus. 3. Moderate osteoarthritis of the medial compartment with full-thickness cartilage loss and subcortical edema and subcortical cyst formation in the medial tibial plateau.   Electronically Signed By: Lorriane Shire M.D. On: 09/01/2017 10:59 \    Site Confirmation: Ms. Lineback was asked to confirm the procedure and laterality before marking the site.  Consent: Before the procedure and under the influence of no sedative(s), amnesic(s), or anxiolytics, the patient was informed of the treatment options, risks and possible complications. To fulfill our ethical and legal obligations, as recommended by the American Medical Association's Code of Ethics, I have informed the patient of my clinical impression; the nature and purpose of the treatment or procedure; the  risks, benefits, and possible complications of the intervention; the alternatives, including doing nothing; the risk(s) and benefit(s) of the alternative treatment(s) or procedure(s); and the risk(s) and benefit(s) of doing nothing. The patient was provided information about the general risks  and possible complications associated with the procedure. These may include, but are not limited to: failure to achieve desired goals, infection, bleeding, organ or nerve damage, allergic reactions, paralysis, and death. In addition, the patient was informed of those risks and complications associated to Spine-related procedures, such as failure to decrease pain; infection (i.e.: Meningitis, epidural or intraspinal abscess); bleeding (i.e.: epidural hematoma, subarachnoid hemorrhage, or any other type of intraspinal or peri-dural bleeding); organ or nerve damage (i.e.: Any type of peripheral nerve, nerve root, or spinal cord injury) with subsequent damage to sensory, motor, and/or autonomic systems, resulting in permanent pain, numbness, and/or weakness of one or several areas of the body; allergic reactions; (i.e.: anaphylactic reaction); and/or death. Furthermore, the patient was informed of those risks and complications associated with the medications. These include, but are not limited to: allergic reactions (i.e.: anaphylactic or anaphylactoid reaction(s)); adrenal axis suppression; blood sugar elevation that in diabetics may result in ketoacidosis or comma; water retention that in patients with history of congestive heart failure may result in shortness of breath, pulmonary edema, and decompensation with resultant heart failure; weight gain; swelling or edema; medication-induced neural toxicity; particulate matter embolism and blood vessel occlusion with resultant organ, and/or nervous system infarction; and/or aseptic necrosis of one or more joints. Finally, the patient was informed that Medicine is not an exact  science; therefore, there is also the possibility of unforeseen or unpredictable risks and/or possible complications that may result in a catastrophic outcome. The patient indicated having understood very clearly. We have given the patient no guarantees and we have made no promises. Enough time was given to the patient to ask questions, all of which were answered to the patient's satisfaction. Ms. Glomski has indicated that she wanted to continue with the procedure. Attestation: I, the ordering provider, attest that I have discussed with the patient the benefits, risks, side-effects, alternatives, likelihood of achieving goals, and potential problems during recovery for the procedure that I have provided informed consent.  Date  Time: 11/10/2021  8:37 AM   Prophylactic antibiotics  Anti-infectives (From admission, onward)    None      Indication(s): None identified   Description of procedure   Start Time: 0935 hrs  Local Anesthesia: Once the patient was positioned, prepped, and time-out was completed. The target area was identified located. The skin was marked with an approved surgical skin marker. Once marked, the skin (epidermis, dermis, and hypodermis), and deeper tissues (fat, connective tissue and muscle) were infiltrated with a small amount of a short-acting local anesthetic, loaded on a 10cc syringe with a 25G, 1.5-in  Needle. An appropriate amount of time was allowed for local anesthetics to take effect before proceeding to the next step. Local Anesthetic: Lidocaine 1-2% The unused portion of the local anesthetic was discarded in the proper designated containers. Safety Precautions: Aspiration looking for blood return was conducted prior to all injections. At no point did I inject any substances, as a needle was being advanced. Before injecting, the patient was told to immediately notify me if she was experiencing any new onset of "ringing in the ears, or metallic taste in the mouth". No  attempts were made at seeking any paresthesias. Safe injection practices and needle disposal techniques used. Medications properly checked for expiration dates. SDV (single dose vial) medications used. After the completion of the procedure, all disposable equipment used was discarded in the proper designated medical waste containers.  Technical description: Protocol guidelines were followed. After positioning, the target area was  identified and prepped in the usual manner. Skin & deeper tissues infiltrated with local anesthetic. Appropriate amount of time allowed to pass for local anesthetics to take effect. Proper needle placement secured. Once satisfactory needle placement was confirmed, I proceeded to inject the desired solution in slow, incremental fashion, intermittently assessing for discomfort or any signs of abnormal or undesired spread of substance. Once completed, the needle was removed and disposed of, as per hospital protocols. The area was cleaned, making sure to leave some of the prepping solution back to take advantage of its long term bactericidal properties.  Aspiration:  Negative   Vitals:   11/10/21 0841 11/10/21 0940  BP: (!) 84/60 (!) 77/66  Pulse: 87 89  Resp: 16 16  Temp: (!) 97 F (36.1 C)   TempSrc: Temporal   SpO2: 98% 98%  Weight: 202 lb (91.6 kg)   Height: 5' 8.5" (1.74 m)      End Time:   hrs     Post-op assessment  Post-procedure Vital Signs:  Pulse/HCG Rate: 89  Temp: (!) 97 F (36.1 C)  Resp: 16 BP: (!) 77/66 (A) x3, patient, feels well- will call PCP and recheck at home) SpO2: 98 %  EBL: None  Complications: No immediate post-treatment complications observed by team, or reported by patient.  Note: The patient tolerated the entire procedure well. A repeat set of vitals were taken after the procedure and the patient was kept under observation following institutional policy, for this type of procedure. Post-procedural neurological assessment was  performed, showing return to baseline, prior to discharge. The patient was provided with post-procedure discharge instructions, including a section on how to identify potential problems. Should any problems arise concerning this procedure, the patient was given instructions to immediately contact us, at any time, without hesitation. In any case, we plan to contact the patient by telephone for a follow-up status report regarding this interventional procedure.  Comments:  No additional relevant information.   Plan of care  Chronic Opioid Analgesic:  Hydrocodone 7.5 mg 3 times daily as needed with an extra quantity 15/month (#105)For severe breakthrough pain    Medications administered: We administered Triamcinolone Acetonide, Triamcinolone Acetonide, and lidocaine.  5 cc of triamcinolone injected in each knee.  Follow-up plan:   Return in about 5 weeks (around 12/15/2021) for PP eval,,VV.     Recent Visits Date Type Provider Dept  11/02/21 Office Visit Gillis Santa, MD [facility]  Showing recent visits within past 90 days and meeting all other requirements Today's Visits Date Type Provider Dept  11/10/21 Procedure visit Gillis Santa, MD [facility]  Showing today's visits and meeting all other requirements Future Appointments Date Type Provider Dept  12/15/21 Appointment Gillis Santa, MD [facility]  01/25/22 Appointment Gillis Santa, MD [facility]  Showing future appointments within next 90 days and meeting all other requirements   Disposition: Discharge home  Discharge (Date  Time): 11/10/2021; 0938 hrs.   Primary Care Physician: Jearld Fenton, NP Location: [facility] Note by: Gillis Santa, MD Date: 11/10/2021; Time: 11:36 AM  DISCLAIMER: Medicine is not an exact science. It has no guarantees or warranties. The decision to proceed with this intervention was based on the information collected from the  patient. Conclusions were drawn from the patient's questionnaire, interview, and examination. Because information was provided in large part by the patient, it cannot be guaranteed that it has not been purposely or unconsciously manipulated or altered. Every effort  has been made to obtain as much accurate, relevant, available data as possible. Always take into account that the treatment will also be dependent on availability of resources and existing treatment guidelines, considered by other Pain Management Specialists as being common knowledge and practice, at the time of the intervention. It is also important to point out that variation in procedural techniques and pharmacological choices are the acceptable norm. For Medico-Legal review purposes, the indications, contraindications, technique, and results of the these procedures should only be evaluated, judged and interpreted by a Board-Certified Interventional Pain Specialist with extensive familiarity and expertise in the same exact procedure and technique.

## 2021-11-11 ENCOUNTER — Telehealth: Payer: Self-pay

## 2021-11-11 NOTE — Telephone Encounter (Signed)
Post procedure phone call.  Patients son states she is lying down but is doing well.

## 2021-11-24 ENCOUNTER — Ambulatory Visit: Payer: Self-pay

## 2021-11-24 NOTE — Telephone Encounter (Signed)
  Chief Complaint: Nauseous, Low bp, weakness Symptoms: ibid Frequency: At least the past 3 days. Pertinent Negatives: Patient denies Chest pain Disposition: '[x]'$ ED /'[]'$ Urgent Care (no appt availability in office) / '[]'$ Appointment(In office/virtual)/ '[]'$  [facility]/ '[]'$ Home Care/ '[]'$ Refused Recommended Disposition /'[]'$ [facility] [facility]/ '[]'$  Follow-up with PCP Additional Notes: PT  has been feeling nauseous for the past few days and has not eaten much. She also has lower than her normal bp's . Pt is on HTN medication. PT is a little weak. PT states she is freezing with normal temp.   Summary: nausea, low bp   Pt states she has been nauseated especially when standing w/ dry heaving since 8-7, pt states her bp was  86/58 on 8-14, 90/50 on 8-15 and 100/60 today, 8-16   Pt states she is "freezing"   Pt inquiring if it could be coming from her gallbladder   [facility] fu w/ pt      Reason for Disposition  [8] Fall in systolic BP > 20 mm Hg from normal AND [2] dizzy, lightheaded, or weak  Answer Assessment - Initial Assessment Questions 1. BLOOD PRESSURE: "What is the blood pressure?" "Did you take at least two measurements 5 minutes apart?"     Pt took it 3 days in a row.  2. ONSET: "When did you take your blood pressure?"     At work and 2 times at home 3. HOW: "How did you obtain the blood pressure?" (e.g., visiting nurse, automatic home BP monitor)     Monitor at work  and with home monitor 4. HISTORY: "Do you have a history of low blood pressure?" "What is your blood pressure normally?"     HTN 5. MEDICINES: "Are you taking any medications for blood pressure?" If Yes, ask: "Have they been changed recently?"     Yes - no 6. PULSE RATE: "Do you know what your pulse rate is?"       7. OTHER SYMPTOMS: "Have you been sick recently?" "Have you had a recent injury?"     Nauseous - cold 8. PREGNANCY: "Is there any chance you are pregnant?" "When was your last menstrual period?"      na  Protocols used: Blood Pressure - Low-A-AH

## 2021-11-25 NOTE — Telephone Encounter (Signed)
I do not see that she actually went to the ED.  Has she scheduled an appointment in the office?

## 2021-12-07 ENCOUNTER — Ambulatory Visit: Payer: Self-pay

## 2021-12-07 NOTE — Telephone Encounter (Signed)
    Chief Complaint: Lower abdominal pain that comes and goes Symptoms: Pain, nausea Frequency: 2 weeks ago Pertinent Negatives: Patient denies vomiting, diarrhea Disposition: '[]'$ ED /'[]'$ Urgent Care (no appt availability in office) / '[x]'$ Appointment(In office/virtual)/ '[]'$  [facility]/ '[]'$ Home Care/ '[]'$ Refused Recommended Disposition /'[]'$ [place] [facility]/ '[]'$  Follow-up with PCP Additional Notes: Will go to ED for worsening of symptoms.  Reason for Disposition  [1] MODERATE pain (e.g., interferes with normal activities) AND [2] pain comes and goes (cramps) AND [3] present > 24 hours  (Exception: Pain with Vomiting or Diarrhea - see that Guideline.)  Answer Assessment - Initial Assessment Questions 1. LOCATION: "Where does it hurt?"      Lower, left side 2. RADIATION: "Does the pain shoot anywhere else?" (e.g., chest, back)     No 3. ONSET: "When did the pain begin?" (e.g., minutes, hours or days ago)      2 weeks ago 4. SUDDEN: "Gradual or sudden onset?"     Gradual 5. PATTERN "Does the pain come and go, or is it constant?"    - If it comes and goes: "How long does it last?" "Do you have pain now?"     (Note: Comes and goes means the pain is intermittent. It goes away completely between bouts.)    - If constant: "Is it getting better, staying the same, or getting worse?"      (Note: Constant means the pain never goes away completely; most serious pain is constant and gets worse.)      Comes and goes 6. SEVERITY: "How bad is the pain?"  (e.g., Scale 1-10; mild, moderate, or severe)    - MILD (1-3): Doesn't interfere with normal activities, abdomen soft and not tender to touch.     - MODERATE (4-7): Interferes with normal activities or awakens from sleep, abdomen tender to touch.     - SEVERE (8-10): Excruciating pain, doubled over, unable to do any normal activities.       Now - 6 7. RECURRENT SYMPTOM: "Have you ever had this type of stomach pain before?" If Yes, ask: "When was  the last time?" and "What happened that time?"      Yes 8. CAUSE: "What do you think is causing the stomach pain?"     Unsure 9. RELIEVING/AGGRAVATING FACTORS: "What makes it better or worse?" (e.g., antacids, bending or twisting motion, bowel movement)     No 10. OTHER SYMPTOMS: "Do you have any other symptoms?" (e.g., back pain, diarrhea, fever, urination pain, vomiting)       Nausea 11. PREGNANCY: "Is there any chance you are pregnant?" "When was your last menstrual period?"       No  Protocols used: Abdominal Pain - [facility]

## 2021-12-09 ENCOUNTER — Ambulatory Visit (INDEPENDENT_AMBULATORY_CARE_PROVIDER_SITE_OTHER): Payer: Medicare HMO | Admitting: Internal Medicine

## 2021-12-09 ENCOUNTER — Encounter: Payer: Self-pay | Admitting: Internal Medicine

## 2021-12-09 VITALS — BP 114/76 | HR 90 | Temp 96.9°F | Wt 205.0 lb

## 2021-12-09 DIAGNOSIS — F5104 Psychophysiologic insomnia: Secondary | ICD-10-CM

## 2021-12-09 DIAGNOSIS — E538 Deficiency of other specified B group vitamins: Secondary | ICD-10-CM

## 2021-12-09 DIAGNOSIS — K219 Gastro-esophageal reflux disease without esophagitis: Secondary | ICD-10-CM

## 2021-12-09 DIAGNOSIS — E890 Postprocedural hypothyroidism: Secondary | ICD-10-CM

## 2021-12-09 DIAGNOSIS — R5383 Other fatigue: Secondary | ICD-10-CM

## 2021-12-09 DIAGNOSIS — E559 Vitamin D deficiency, unspecified: Secondary | ICD-10-CM | POA: Diagnosis not present

## 2021-12-09 DIAGNOSIS — R110 Nausea: Secondary | ICD-10-CM

## 2021-12-09 DIAGNOSIS — R1013 Epigastric pain: Secondary | ICD-10-CM

## 2021-12-09 MED ORDER — FAMOTIDINE 20 MG PO TABS: 20.0000 mg | ORAL_TABLET | Freq: Every day | ORAL | 1 refills | Status: DC

## 2021-12-09 MED ORDER — ONDANSETRON 4 MG PO TBDP: 4.0000 mg | ORAL_TABLET | Freq: Every day | ORAL | 0 refills | Status: DC | PRN

## 2021-12-09 NOTE — Progress Notes (Signed)
Subjective:    Patient ID: Pamela Romero, female    DOB: [birth year], 71 y.o.   MRN: [id number]  HPI  Patient presents to clinic today with complaint of fatigue, nausea, vomiting and LLQ abdominal pain.  She reports this started 2 weeks ago. She reports the vomiting and abdominal pain have improved, but she continues to have nausea and fatigue. She reports poor appetite but has not had any weight gain. She is taking Esomeprazole as prescribed. She is out of her Zofran and would like this refilled today. CT abdomen/pelvis from [place] did not show any evidence of diverticulosis. She reports she started B12 injections 4 months ago. She is taking Vit D OTC. She reports she does not sleep well. She is unable to fall asleep and stay asleep. She reports she sleeps well with Gabapentin but rarely takes this. Sleep study from 12/2019 reviewed.  Review of Systems     Past Medical History:  Diagnosis Date   Abnormal EKG    CAD (coronary artery disease)    non critical, by cardiac cath 2006   Cancer [facility])    endometrial Ca   COPD (chronic obstructive pulmonary disease) ([place])    Endometrial adenocarcinoma ([place]) 2011   Hyperlipidemia    Hypertension    Osteoarthritis    Right foot, Left knee (Dr. Roland Rack)   Reflux gastritis    Thyroid disease    tobacco abuse    Vaginal delivery    x 2    Current Outpatient Medications  Medication Sig Dispense Refill   albuterol (PROAIR HFA) 108 (90 Base) MCG/ACT inhaler Inhale 1-2 puffs into the lungs every 6 (six) hours as needed for wheezing or shortness of breath. 6.7 g 1   aspirin EC 81 MG tablet Take 1 tablet (81 mg total) by mouth daily. Swallow whole. 30 tablet 12   atorvastatin (LIPITOR) 10 MG tablet Take 1 tablet (10 mg total) by mouth daily. 30 tablet 2   cyanocobalamin (,VITAMIN B-12,) 1000 MCG/ML injection Inject 1 ML SQ every 2 weeks x 2 months 4 mL 0   esomeprazole (NEXIUM) 40 MG capsule Take 1 capsule (40 mg total) by mouth 2 (two) times  daily before a meal. 180 capsule 0   gabapentin (NEURONTIN) 300 MG capsule Take 1-2 capsules (300-600 mg total) by mouth at bedtime. 180 capsule 2   HYDROcodone-acetaminophen (NORCO) 7.5-325 MG tablet Take 1 tablet by mouth every 6 (six) hours as needed for severe pain. Must last 30 days. 105 tablet 0   [START ON 01/03/2022] HYDROcodone-acetaminophen (NORCO) 7.5-325 MG tablet Take 1 tablet by mouth every 6 (six) hours as needed for severe pain. Must last 30 days. 105 tablet 0   levothyroxine (SYNTHROID) 150 MCG tablet Take 1 tablet (150 mcg total) by mouth daily. 90 tablet 3   lisinopril-hydrochlorothiazide (ZESTORETIC) 20-12.5 MG tablet Take 1 tablet by mouth daily. 90 tablet 3   ondansetron (ZOFRAN-ODT) 4 MG disintegrating tablet Take 1 tablet (4 mg total) by mouth every 8 (eight) hours as needed for nausea or vomiting. 20 tablet 1   rizatriptan (MAXALT-MLT) 10 MG disintegrating tablet Take 1 tablet (10 mg total) by mouth as needed for migraine. May repeat in 2 hours if needed 10 tablet 2   sertraline (ZOLOFT) 50 MG tablet Take 1/2 tab PO x 10 days then increase to 1 tab daily thereafter 90 tablet 1   No current facility-administered medications for this visit.    Not on File  Family History  Problem Relation Age of Onset   Alcohol abuse Father    Cancer Maternal Aunt        stomach   Alcohol abuse Maternal Aunt    Alcohol abuse Mother    Heart disease Brother        MI age 11- non smoker   Cancer Brother        Pancreatic   Cancer Brother        colon, dx'd age 42   Cancer Brother        stomach, paternal half brother   Breast cancer Neg Hx     Social History   Socioeconomic History   Marital status: Married    Spouse name: Not on file   Number of children: 0   Years of education: Not on file   Highest education level: Not on file  Occupational History   Occupation: retired   Tobacco Use   Smoking status: Former    Packs/day: 0.50    Years: 50.00    Total pack years:  25.00    Types: Cigarettes    Quit date: 05/11/2018    Years since quitting: 3.5   Smokeless tobacco: Former  Scientific laboratory technician Use: Never used  Substance and Sexual Activity   Alcohol use: No   Drug use: No   Sexual activity: Not on file  Other Topics Concern   Not on file  Social History Narrative   Lives with spouse, takes care of her mother.   Had 2 children, one died at birth, the other given up for adoption (was unmarried).   Social Determinants of Health   Financial Resource Strain: Medium Risk (06/13/2019)   Overall Financial Resource Strain (CARDIA)    Difficulty of Paying Living Expenses: Somewhat hard  Food Insecurity: No Food Insecurity (06/13/2019)   Hunger Vital Sign    Worried About Running Out of Food in the Last Year: Never true    Ran Out of Food in the Last Year: Never true  Transportation Needs: No Transportation Needs (06/13/2019)   PRAPARE - Hydrologist (Medical): No    Lack of Transportation (Non-Medical): No  Physical Activity: Insufficiently Active (12/13/2019)   Exercise Vital Sign    Days of Exercise per Week: 4 days    Minutes of Exercise per Session: 30 min  Stress: No Stress Concern Present (06/13/2019)   [place]    Feeling of Stress : Only a little  Social Connections: Not on file  Intimate Partner Violence: Not At Risk (08/08/2019)   Humiliation, Afraid, Rape, and Kick questionnaire    Fear of Current or Ex-Partner: No    Emotionally Abused: No    Physically Abused: No    Sexually Abused: No     Constitutional: Patient reports fatigue.  Denies fever, malaise, headache or abrupt weight changes.  HEENT: Denies eye pain, eye redness, ear pain, ringing in the ears, wax buildup, runny nose, nasal congestion, bloody nose, or sore throat. Respiratory: Denies difficulty breathing, shortness of breath, cough or sputum production.   Cardiovascular: Denies  chest pain, chest tightness, palpitations or swelling in the hands or feet.  Gastrointestinal: Patient reports nausea and reflux.  Denies bloating, constipation, diarrhea or blood in the stool.  GU: Denies urgency, frequency, pain with urination, burning sensation, blood in urine, odor or discharge. Musculoskeletal: Denies decrease in range of motion, difficulty with gait, muscle pain or joint pain and  swelling.  Skin: Denies redness, rashes, lesions or ulcercations.  Neurological: Patient orts insomnia.  Denies dizziness, difficulty with memory, difficulty with speech or problems with balance and coordination.  Psych: Patient has a history of anxiety depression.  Denies SI/HI.  No other specific complaints in a complete review of systems (except as listed in HPI above).  Objective:   Physical Exam BP 114/76 (BP Location: Left Arm, Patient Position: Sitting, Cuff Size: Normal)   Pulse 90   Temp (!) 96.9 F (36.1 C) (Temporal)   Wt 205 lb (93 kg)   SpO2 99%   BMI 30.72 kg/m   Wt Readings from Last 3 Encounters:  11/10/21 202 lb (91.6 kg)  11/02/21 200 lb (90.7 kg)  10/26/21 209 lb 3.2 oz (94.9 kg)    General: Appears her stated age, obese chronically ill-appearing, in NAD. Skin: Warm, dry and intact.  HEENT: Head: normal shape and size; Eyes: sclera white, no icterus, conjunctiva pink, PERRLA and EOMs intact;  Neck:  Neck supple, trachea midline. No masses, lumps or thyromegaly present.  Cardiovascular: Normal rate and rhythm. S1,S2 noted.  No murmur, rubs or gallops noted. Pulmonary/Chest: Normal effort and positive vesicular breath sounds. No respiratory distress. No wheezes, rales or ronchi noted.  Abdomen: Soft and tender in the epigastric region. Normal bowel sounds.  Neurological: Alert and oriented.  Psychiatric: Mood and affect flat. Behavior is normal. Judgment and thought content normal.    BMET    Component Value Date/Time   NA 143 10/05/2021 0921   NA 141  08/29/2017 0950   K 3.8 10/05/2021 0921   CL 107 10/05/2021 0921   CO2 27 10/05/2021 0921   GLUCOSE 83 10/05/2021 0921   BUN 18 10/05/2021 0921   BUN 19 08/29/2017 0950   CREATININE 1.23 (H) 10/05/2021 0921   CALCIUM 9.5 10/05/2021 0921   GFRNONAA >60 04/07/2020 1022   GFRNONAA 52 (L) 01/02/2020 1453   GFRAA 61 01/02/2020 1453    Lipid Panel     Component Value Date/Time   CHOL 212 (H) 10/05/2021 0921   TRIG 119 10/05/2021 0921   HDL 46 (L) 10/05/2021 0921   CHOLHDL 4.6 10/05/2021 0921   VLDL 42.0 (H) 05/14/2014 0918   LDLCALC 142 (H) 10/05/2021 0921    CBC    Component Value Date/Time   WBC 5.5 10/05/2021 0921   RBC 3.40 (L) 10/05/2021 0921   HGB 11.1 (L) 10/05/2021 0921   HCT 34.1 (L) 10/05/2021 0921   PLT 147 10/05/2021 0921   MCV 100.3 (H) 10/05/2021 0921   MCH 32.6 10/05/2021 0921   MCHC 32.6 10/05/2021 0921   RDW 11.8 10/05/2021 0921   LYMPHSABS 1,331 10/05/2021 0921   MONOABS 0.4 09/16/2013 1046   EOSABS 61 10/05/2021 0921   BASOSABS 39 10/05/2021 0921    Hgb A1C Lab Results  Component Value Date   HGBA1C 4.9 10/05/2021            Assessment & Plan:   Epigastric Pain, Nausea, GERD:  Continue as omeprazole 40 mg twice daily And famotidine 20 mg nightly Zofran refilled today Discussed referral to GI for upper GI but she declines at this time  Fatigue, Insomnia, Vitamin D Deficiency, Vitamin B12 Deficiency, Hypothyroidism:  We will check vitamin D and B12 We will check TSH and free T4 Start taking gabapentin nightly  RTC in 4 months for follow-up of chronic conditions [name], NP

## 2021-12-09 NOTE — Patient Instructions (Signed)
Weakness Weakness is a lack of strength. You may feel weak all over your body (generalized), or you may feel weak in one part of your body (focal). Common causes of weakness include: Infection and disorders of the body's defense system (immune system). Physical exhaustion. Internal bleeding or other blood loss that results in a lack of red blood cells (anemia). Dehydration. An imbalance in mineral (electrolyte) levels, such as potassium. Chronic kidney or liver disease. Cancer. Other causes include: Some medicines or cancer treatment. Stress, anxiety, or depression. Heart disease, circulation problems, or stroke. Nervous system disorders. Thyroid disorders. Loss of muscle strength because of age or inactivity. Poor sleep quality or sleep disorders. The cause of your weakness may not be known. Some causes of weakness can be serious, so it is important to see your health care provider. Follow these instructions at home: Activity Rest as needed. Try to get enough sleep. Most adults need 7-8 hours of quality sleep each night. Talk to your health care provider about how much sleep you need. Do exercises, such as arm curls and leg raises, for 30 minutes at least 2 days a week or as told by your health care provider. This helps build muscle strength. Consider working with a physical therapist or trainer who can develop an exercise plan to help you gain muscle strength. General instructions  Take over-the-counter and prescription medicines only as told by your health care provider. Eat a healthy, well-balanced diet. This includes: Proteins to build muscles, such as lean meats and fish. Fresh fruits and vegetables. Carbohydrates to boost energy, such as whole grains. Drink enough fluid to keep your urine pale yellow. Keep all follow-up visits. This is important. Contact a health care provider if: Your weakness does not improve or gets worse. Your weakness affects your ability to think  clearly. Your weakness affects your ability to do your normal daily activities. Get help right away if: You develop sudden weakness, especially on one side of your face or body. You have chest pain. You have trouble breathing or shortness of breath. You have problems with your vision. You have trouble talking or swallowing. You have trouble standing or walking. You are light-headed or lose consciousness. These symptoms may be an emergency. Get help right away. Call 911. Do not wait to see if the symptoms will go away. Do not drive yourself to the hospital. Summary Weakness is a lack of strength. You may feel weak all over your body or just in one specific part of your body. Weakness can be caused by a variety of things. In some cases, the cause may be unknown. Rest as needed, and try to get enough sleep. Most adults need 7-8 hours of quality sleep each night. Eat a healthy, well-balanced diet. This information is not intended to replace advice given to you by your health care provider. Make sure you discuss any questions you have with your health care provider. Document Revised: 02/28/2021 Document Reviewed: 02/28/2021 Elsevier Patient Education  2023 Else[employer] Inc.  

## 2021-12-10 ENCOUNTER — Other Ambulatory Visit: Payer: Self-pay | Admitting: Internal Medicine

## 2021-12-10 LAB — T4, FREE: Free T4: 0.3 ng/dL — ABNORMAL LOW (ref 0.8–1.8)

## 2021-12-10 LAB — CBC
HCT: 38.2 % (ref 35.0–45.0)
Hemoglobin: 12.8 g/dL (ref 11.7–15.5)
MCH: 33.1 pg — ABNORMAL HIGH (ref 27.0–33.0)
MCHC: 33.5 g/dL (ref 32.0–36.0)
MCV: 98.7 fL (ref 80.0–100.0)
MPV: 10.5 fL (ref 7.5–12.5)
Platelets: 139 10*3/uL — ABNORMAL LOW (ref 140–400)
RBC: 3.87 10*6/uL (ref 3.80–5.10)
RDW: 13.2 % (ref 11.0–15.0)
WBC: 6.3 10*3/uL (ref 3.8–10.8)

## 2021-12-10 LAB — VITAMIN D 25 HYDROXY (VIT D DEFICIENCY, FRACTURES): Vit D, 25-Hydroxy: 18 ng/mL — ABNORMAL LOW (ref 30–100)

## 2021-12-10 LAB — TSH: TSH: 105.76 mIU/L — ABNORMAL HIGH (ref 0.40–4.50)

## 2021-12-10 LAB — VITAMIN B12: Vitamin B-12: 447 pg/mL (ref 200–1100)

## 2021-12-10 MED ORDER — VITAMIN D (ERGOCALCIFEROL) 1.25 MG (50000 UNIT) PO CAPS: 50000.0000 [IU] | ORAL_CAPSULE | ORAL | 0 refills | Status: DC

## 2021-12-10 NOTE — Addendum Note (Signed)
Addended by: Jearld Fenton on: 12/10/2021 10:07 AM   Modules accepted: Orders

## 2021-12-14 ENCOUNTER — Telehealth: Payer: Self-pay

## 2021-12-14 NOTE — Telephone Encounter (Signed)
LM for patient to call office for pre virtual appointment questions.  

## 2021-12-14 NOTE — Telephone Encounter (Signed)
Requested Prescriptions  Pending Prescriptions Disp Refills  . sertraline (ZOLOFT) 50 MG tablet [Pharmacy Med Name: SERTRALINE HCL 50 MG TAB] 90 tablet 1    Sig: TAKE 1/2 TABLET BY MOUTH ONCE DAILY FOR 10 DAYS THEN INCREASE TO 1 TABLET ONCE DAILY AFTER     Psychiatry:  Antidepressants - SSRI - sertraline Passed - 12/10/2021 11:55 AM      Passed - AST in normal range and within 360 days    AST  Date Value Ref Range Status  10/05/2021 14 10 - 35 U/L Final         Passed - ALT in normal range and within 360 days    ALT  Date Value Ref Range Status  10/05/2021 9 6 - 29 U/L Final         Passed - Completed PHQ-2 or PHQ-9 in the last 360 days      Passed - Valid encounter within last 6 months    Recent Outpatient Visits          5 days ago B12 deficiency   [facility] [place], Coralie Keens, NP   1 month ago Episodic migraine   [place], DO   2 months ago Encounter for general adult medical examination with abnormal findings   [facility] [place], Coralie Keens, NP   3 months ago B12 deficiency   [facility] [place], Coralie Keens, NP   4 months ago B12 deficiency   [facility] [place], Coralie Keens, NP      Future Appointments            In 3 months [place], Coralie Keens, NP [facility], [facility]

## 2021-12-15 ENCOUNTER — Ambulatory Visit
Payer: Medicare HMO | Attending: Student in an Organized Health Care Education/Training Program | Admitting: Student in an Organized Health Care Education/Training Program

## 2021-12-15 DIAGNOSIS — M170 Bilateral primary osteoarthritis of knee: Secondary | ICD-10-CM | POA: Diagnosis not present

## 2021-12-15 DIAGNOSIS — G894 Chronic pain syndrome: Secondary | ICD-10-CM | POA: Diagnosis not present

## 2021-12-15 DIAGNOSIS — M1712 Unilateral primary osteoarthritis, left knee: Secondary | ICD-10-CM

## 2021-12-15 NOTE — Progress Notes (Signed)
Patient: Pamela Romero  Service Category: E/M  Provider: Gillis Santa, MD  DOB: 13-Dec-1950  DOS: 12/15/2021  Location: Office  MRN: [id number]  Setting: Ambulatory outpatient  Referring Provider: Jearld Fenton, NP  Type: Established Patient  Specialty: Interventional Pain Management  PCP: Jearld Fenton, NP  Location: Remote location  Delivery: TeleHealth     Virtual Encounter - Pain Management PROVIDER NOTE: Information contained herein reflects review and annotations entered in association with encounter. Interpretation of such information and data should be left to medically-trained personnel. Information provided to patient can be located elsewhere in the medical record under "Patient Instructions". Document created using STT-dictation technology, any transcriptional errors that may result from process are unintentional.    Contact & Pharmacy Preferred: [phone] Home: [phone] (home) Mobile: [phone] (mobile) E-mail: No e-mail address on record  [place], [place]. [place] [address] Phone: [phone] Fax: [phone]  [id number] - [place], [place] - [address] [place] [address] Phone: [phone] Fax: [phone]   Pre-screening  Pamela Romero offered "in-person" vs "virtual" encounter. She indicated preferring virtual for this encounter.   Reason COVID-19*  Social distancing based on CDC and AMA recommendations.   I contacted Pamela Romero on 12/15/2021 via telephone.      I clearly identified myself as Gillis Santa, MD. I verified that I was speaking with the correct person using two identifiers (Name: Pamela Romero, and date of birth: [birth year]).  Consent I sought verbal advanced consent from Pamela Romero for virtual visit interactions. I informed Pamela Romero of possible security and privacy concerns, risks, and limitations associated with providing "not-in-person" medical evaluation and  management services. I also informed Pamela Romero of the availability of "in-person" appointments. Finally, I informed her that there would be a charge for the virtual visit and that she could be  personally, fully or partially, financially responsible for it. Pamela Romero expressed understanding and agreed to proceed.   Historic Elements   Pamela Romero is a 71 y.o. year old, female patient evaluated today after our last contact on 11/10/2021. Pamela Romero  has a past medical history of Abnormal EKG, CAD (coronary artery disease), Cancer ([place]), COPD (chronic obstructive pulmonary disease) ([place]), Endometrial adenocarcinoma ([place]) (2011), Hyperlipidemia, Hypertension, Osteoarthritis, Reflux gastritis, Thyroid disease, tobacco abuse, and Vaginal delivery. She also  has a past surgical history that includes Thyroidectomy (2005); Cardiac catheterization (2006); Total abdominal hysterectomy; Breast surgery (Right, March 2014); Breast cyst aspiration (Right, 2014); and Esophagogastroduodenoscopy (N/A, 04/07/2020). Pamela Romero has a current medication list which includes the following prescription(s): albuterol, aspirin ec, atorvastatin, cyanocobalamin, esomeprazole, famotidine, gabapentin, hydrocodone-acetaminophen, [START ON 01/03/2022] hydrocodone-acetaminophen, levothyroxine, lisinopril-hydrochlorothiazide, ondansetron, rizatriptan, sertraline, and vitamin d (ergocalciferol). She  reports that she quit smoking about 3 years ago. Her smoking use included cigarettes. She has a 25.00 pack-year smoking history. She has quit using smokeless tobacco. She reports that she does not drink alcohol and does not use drugs. Pamela Romero has no allergies on file.   HPI  Today, she is being contacted for a post-procedure assessment.   Post-procedure evaluation    Procedure: Steroid Knee Injection (LONG ACTING Kennalog) #3 (#2 done 02/24/21) Laterality: Bilateral (-50) Level: Intra-articular  No.: R2 L3 Series:  n/a Purpose: Therapeutic Indications: Knee arthralgia associated to osteoarthritis of the knee   Imaging: None required ([id number]) Analgesia: Skin infiltration w/ local anesthetics Sedation: None  NAS-11 score:  Pre-procedure: 5 /10   Post-procedure: 0-No pain/10       Effectiveness:  Initial hour after procedure: 100 %  Subsequent 4-6 hours post-procedure: 100 %  Analgesia past initial 6 hours: 75 % (ongoing)  Ongoing improvement:  Analgesic:  75% Function: Somewhat improved ROM: Somewhat improved   Pharmacotherapy Assessment   Opioid Analgesic: Hydrocodone 7.5 mg 3 times daily as needed with an extra quantity 15/month (#105)For severe breakthrough pain    Monitoring: [place] PMP: PDMP reviewed during this encounter.       Pharmacotherapy: No side-effects or adverse reactions reported. Compliance: No problems identified. Effectiveness: Clinically acceptable. Plan: Refer to "POC". UDS:  Summary  Date Value Ref Range Status  11/02/2021 Note  Final    Comment:    ==================================================================== [employer] Select 13 (MW) ==================================================================== Test                             Result       Flag       Units  Drug Present and Declared for Prescription Verification   Hydrocodone                    1063         EXPECTED   ng/mg creat   Hydromorphone                  119          EXPECTED   ng/mg creat   Dihydrocodeine                 191          EXPECTED   ng/mg creat   Norhydrocodone                 2059         EXPECTED   ng/mg creat    Sources of hydrocodone include scheduled prescription medications.    Hydromorphone, dihydrocodeine and norhydrocodone are expected    metabolites of hydrocodone. Hydromorphone and dihydrocodeine are    also available as scheduled prescription medications.  ==================================================================== Test                      Result     Flag   Units      Ref Range   Creatinine              172              mg/dL      >=20 ==================================================================== Declared Medications:  The flagging and interpretation on this report are based on the  following declared medications.  Unexpected results may arise from  inaccuracies in the declared medications.   **Note: The testing scope of this panel includes these medications:   Hydrocodone (Norco)   **Note: The testing scope of this panel does not include the  following reported medications:   Acetaminophen (Norco)  Albuterol (Ventolin HFA)  Aspirin  Atorvastatin (Lipitor)  Esomeprazole (Nexium)  Gabapentin (Neurontin)  Hydrochlorothiazide (Zestoretic)  Levothyroxine (Synthroid)  Lisinopril (Zestoretic)  Ondansetron (Zofran)  Rizatriptan (Maxalt)  Sertraline (Zoloft)  Vitamin B12 ==================================================================== For clinical consultation, please call [phone]. ====================================================================    No results found for: "CBDTHCR", "D8THCCBX", "D9THCCBX"   Laboratory Chemistry Profile   Renal Lab Results  Component Value Date   BUN 18 10/05/2021   CREATININE 1.23 (H) 10/05/2021   BCR 15 10/05/2021   GFR 60.34  01/21/2015   GFRAA 61 01/02/2020   GFRNONAA >60 04/07/2020    Hepatic Lab Results  Component Value Date   AST 14 10/05/2021   ALT 9 10/05/2021   ALBUMIN 4.4 08/29/2017   ALKPHOS 126 (H) 08/29/2017   HCVAB NEGATIVE 01/21/2015    Electrolytes Lab Results  Component Value Date   NA 143 10/05/2021   K 3.8 10/05/2021   CL 107 10/05/2021   CALCIUM 9.5 10/05/2021   MG 1.3 (L) 09/15/2021    Bone Lab Results  Component Value Date   VD25OH 18 (L) 12/09/2021    Inflammation (CRP: Acute Phase) (ESR: Chronic Phase) Lab Results  Component Value Date   ESRSEDRATE 19 03/30/2012         Note: Above Lab results reviewed.  Imaging  MR  Brain W Wo Contrast CLINICAL DATA:  TIA. Recurrent episodes of transient visual loss. Frequent headaches.  EXAM: MRI HEAD WITHOUT AND WITH CONTRAST  TECHNIQUE: Multiplanar, multiecho pulse sequences of the brain and surrounding structures were obtained without and with intravenous contrast.  CONTRAST:  63mL GADAVIST GADOBUTROL 1 MMOL/ML IV SOLN  COMPARISON:  Head MRI 09/25/2019  FINDINGS: Brain: There is no evidence of an acute infarct, intracranial hemorrhage, mass, midline shift, or extra-axial fluid collection. The ventricles and sulci are within normal limits for age. Confluent T2 hyperintensities throughout the cerebral white matter bilaterally are unchanged and nonspecific but compatible with severe chronic small vessel ischemic disease. Chronic lacunar infarcts are noted in the corona radiata and basal ganglia bilaterally. No abnormal enhancement is identified.  Vascular: Major intracranial vascular flow voids are preserved.  Skull and upper cervical spine: Unremarkable bone marrow signal.  Sinuses/Orbits: Unremarkable orbits. Paranasal sinuses and mastoid air cells are clear.  Other: None.  IMPRESSION: 1. No acute intracranial abnormality. 2. Severe chronic small vessel ischemic disease.  Electronically Signed   By: Logan Bores M.D.   On: 09/27/2021 11:21  Assessment  The primary encounter diagnosis was Primary osteoarthritis of left knee. Diagnoses of Bilateral primary osteoarthritis of knee and Chronic pain syndrome were also pertinent to this visit.  Plan of Care  Significant analgesic and functional benefit after long-acting Kenalog injection done on 11/10/2021.  Patient states that for the first 3 weeks she had absolutely no pain.  She is currently endorsing her pain relief at approximately 65 to 70%.  She is continuing to walk and does household chores which does seem to aggravate her knee pain at times.  She has a medication management appointment with me  in October.    Follow-up plan:   No follow-ups on file.     Status post left genicular RFA 04/16/2018, status post right genicular RFA 03/12/2018.  Provided her with benefit.  Status post left knee intra-articular steroid injection #3 on 11/21/2018.   hyalgan left knee #1 on 04/17/19, #2 05/22/19, #3 07/03/2019, #4 08/28/19, #5 on 09/25/19              Recent Visits Date Type Provider Dept  11/10/21 Procedure visit Gillis Santa, MD [facility]  11/02/21 Office Visit Gillis Santa, MD [facility]  Showing recent visits within past 90 days and meeting all other requirements Today's Visits Date Type Provider Dept  12/15/21 Office Visit Gillis Santa, MD [facility]  Showing today's visits and meeting all other requirements Future Appointments Date Type Provider Dept  01/25/22 Appointment Gillis Santa, MD [facility]  Showing future appointments within next 90 days and meeting all other  requirements  I discussed the assessment and treatment plan with the patient. The patient was provided an opportunity to ask questions and all were answered. The patient agreed with the plan and demonstrated an understanding of the instructions.  Patient advised to call back or seek an in-person evaluation if the symptoms or condition worsens.  Duration of encounter: 76minutes.  Note by: Gillis Santa, MD Date: 12/15/2021; Time: 10:21 AM

## 2022-01-25 ENCOUNTER — Other Ambulatory Visit: Payer: Self-pay | Admitting: Internal Medicine

## 2022-01-25 ENCOUNTER — Ambulatory Visit
Payer: Medicare HMO | Attending: Student in an Organized Health Care Education/Training Program | Admitting: Student in an Organized Health Care Education/Training Program

## 2022-01-25 ENCOUNTER — Encounter: Payer: Self-pay | Admitting: Student in an Organized Health Care Education/Training Program

## 2022-01-25 VITALS — BP 177/118 | HR 94 | Temp 97.1°F | Resp 18 | Ht 68.0 in | Wt 198.0 lb

## 2022-01-25 DIAGNOSIS — M170 Bilateral primary osteoarthritis of knee: Secondary | ICD-10-CM | POA: Insufficient documentation

## 2022-01-25 DIAGNOSIS — G8929 Other chronic pain: Secondary | ICD-10-CM | POA: Insufficient documentation

## 2022-01-25 DIAGNOSIS — M5416 Radiculopathy, lumbar region: Secondary | ICD-10-CM | POA: Diagnosis not present

## 2022-01-25 DIAGNOSIS — G894 Chronic pain syndrome: Secondary | ICD-10-CM | POA: Diagnosis not present

## 2022-01-25 MED ORDER — HYDROCODONE-ACETAMINOPHEN 7.5-325 MG PO TABS: 1.0000 | ORAL_TABLET | Freq: Four times a day (QID) | ORAL | 0 refills | Status: AC | PRN

## 2022-01-25 MED ORDER — LISINOPRIL-HYDROCHLOROTHIAZIDE 20-12.5 MG PO TABS: 1.0000 | ORAL_TABLET | Freq: Every day | ORAL | 0 refills | Status: DC

## 2022-01-25 MED ORDER — HYDROCODONE-ACETAMINOPHEN 7.5-325 MG PO TABS: 1.0000 | ORAL_TABLET | Freq: Four times a day (QID) | ORAL | 0 refills | Status: DC | PRN

## 2022-01-25 NOTE — Patient Instructions (Signed)
Do not eat or drink for 8 hours prior to procedure You may drive yourself home.  Follow up with PCP about blood pressure.

## 2022-01-25 NOTE — Progress Notes (Signed)
PROVIDER NOTE: Information contained herein reflects review and annotations entered in association with encounter. Interpretation of such information and data should be left to medically-trained personnel. Information provided to patient can be located elsewhere in the medical record under "Patient Instructions". Document created using STT-dictation technology, any transcriptional errors that may result from process are unintentional.    Patient: Pamela Romero  Service Category: E/M  Provider: Gillis Santa, MD  DOB: 07/18/1950  DOS: 01/25/2022  Specialty: Interventional Pain Management  MRN: [id number]  Setting: Ambulatory outpatient  PCP: Jearld Fenton, NP  Type: Established Patient    Referring Provider: Jearld Fenton, NP  Location: Office  Delivery: Face-to-face     HPI  Ms. MACARIA BIAS, a 71 y.o. year old female, is here today because of her Bilateral primary osteoarthritis of knee [M17.0]. Ms. Ruacho primary complain today is Leg Pain (Bilateral, right is worse)  Last encounter: My last encounter with her was on 01/25/22 Pertinent problems: Ms. Edmundson has Major depressive disorder, recurrent, mild ([place]); Bilateral primary osteoarthritis of knee; and Osteoporosis on their pertinent problem list. Pain Assessment: Severity of Chronic pain is reported as a 6 /10. Location: Leg Right, Left (right is worse)/ . Onset:  . Quality: Throbbing, Aching. Timing:  . Modifying factor(s): radiates from knees down. Vitals:  height is $RemoveB'5\' 8"'OkiPesoA$  (1.727 m) and weight is 198 lb (89.8 kg). Her temperature is 97.1 F (36.2 C) (abnormal). Her blood pressure is 177/118 (abnormal) and her pulse is 94. Her respiration is 18 and oxygen saturation is 98%.   Reason for encounter:  MM and worsening  bilateral knee pain  Refill of hydrocodone as below.  No change in dose.  Patient requesting repeat long-acting Kenalog injection for both knees.  She had long-acting Kenalog previously done in 11/10/21  that  provided her to her with 75 to 80% pain relief for about 19 weeks and now she is having increased pain especially with weightbearing for the left knee. She states that his may have been attributed to a partial fall.  Plan to repeat long acting Kenalog after 02/10/22 which will be 3 months after her previous knee injection   Pharmacotherapy Assessment  Analgesic: Hydrocodone 7.5 mg 3 times daily as needed with an extra quantity 15/month (#105)For severe breakthrough pain    Monitoring:  PMP: PDMP reviewed during this encounter.       Pharmacotherapy: No side-effects or adverse reactions reported. Compliance: No problems identified. Effectiveness: Clinically acceptable.  UDS:  Summary  Date Value Ref Range Status  11/02/2021 Note  Final    Comment:    ==================================================================== [employer] Select 13 (MW) ==================================================================== Test                             Result       Flag       Units  Drug Present and Declared for Prescription Verification   Hydrocodone                    1063         EXPECTED   ng/mg creat   Hydromorphone                  119          EXPECTED   ng/mg creat   Dihydrocodeine                 191  EXPECTED   ng/mg creat   Norhydrocodone                 2059         EXPECTED   ng/mg creat    Sources of hydrocodone include scheduled prescription medications.    Hydromorphone, dihydrocodeine and norhydrocodone are expected    metabolites of hydrocodone. Hydromorphone and dihydrocodeine are    also available as scheduled prescription medications.  ==================================================================== Test                      Result    Flag   Units      Ref Range   Creatinine              172              mg/dL      >=21 ==================================================================== Declared Medications:  The flagging and interpretation on this report  are based on the  following declared medications.  Unexpected results may arise from  inaccuracies in the declared medications.   **Note: The testing scope of this panel includes these medications:   Hydrocodone (Norco)   **Note: The testing scope of this panel does not include the  following reported medications:   Acetaminophen (Norco)  Albuterol (Ventolin HFA)  Aspirin  Atorvastatin (Lipitor)  Esomeprazole (Nexium)  Gabapentin (Neurontin)  Hydrochlorothiazide (Zestoretic)  Levothyroxine (Synthroid)  Lisinopril (Zestoretic)  Ondansetron (Zofran)  Rizatriptan (Maxalt)  Sertraline (Zoloft)  Vitamin B12 ==================================================================== For clinical consultation, please call [phone]. ====================================================================       ROS  Constitutional: Denies any fever or chills Gastrointestinal: No reported hemesis, hematochezia, vomiting, or acute GI distress Musculoskeletal:  Left knee pain right knee pain as well Neurological: No reported episodes of acute onset apraxia, aphasia, dysarthria, agnosia, amnesia, paralysis, loss of coordination, or loss of consciousness  Medication Review  HYDROcodone-acetaminophen, Vitamin D (Ergocalciferol), albuterol, aspirin EC, atorvastatin, cyanocobalamin, esomeprazole, famotidine, gabapentin, levothyroxine, lisinopril-hydrochlorothiazide, ondansetron, rizatriptan, and sertraline  History Review  Allergy: Ms. Dever has no allergies on file. Drug: Ms. Croghan  reports no history of drug use. Alcohol:  reports no history of alcohol use. Tobacco:  reports that she quit smoking about 3 years ago. Her smoking use included cigarettes. She has a 25.00 pack-year smoking history. She has quit using smokeless tobacco. Social: Ms. Wisner  reports that she quit smoking about 3 years ago. Her smoking use included cigarettes. She has a 25.00 pack-year smoking history. She  has quit using smokeless tobacco. She reports that she does not drink alcohol and does not use drugs. Medical:  has a past medical history of Abnormal EKG, CAD (coronary artery disease), Cancer ([facility]), COPD (chronic obstructive pulmonary disease) ([facility]), Endometrial adenocarcinoma ([facility]) (2011), Hyperlipidemia, Hypertension, Osteoarthritis, Reflux gastritis, Thyroid disease, tobacco abuse, and Vaginal delivery. Surgical: Ms. Cockrum  has a past surgical history that includes Thyroidectomy (2005); Cardiac catheterization (2006); Total abdominal hysterectomy; Breast surgery (Right, March 2014); Breast cyst aspiration (Right, 2014); and Esophagogastroduodenoscopy (N/A, 04/07/2020). Family: family history includes Alcohol abuse in her father, maternal aunt, and mother; Cancer in her brother, brother, brother, and maternal aunt; Heart disease in her brother.  Laboratory Chemistry Profile   Renal Lab Results  Component Value Date   BUN 18 10/05/2021   CREATININE 1.23 (H) 10/05/2021   BCR 15 10/05/2021   GFR 60.34 01/21/2015   GFRAA 61 01/02/2020   GFRNONAA >60 04/07/2020    Hepatic Lab Results  Component Value Date  AST 14 10/05/2021   ALT 9 10/05/2021   ALBUMIN 4.4 08/29/2017   ALKPHOS 126 (H) 08/29/2017   HCVAB NEGATIVE 01/21/2015    Electrolytes Lab Results  Component Value Date   NA 143 10/05/2021   K 3.8 10/05/2021   CL 107 10/05/2021   CALCIUM 9.5 10/05/2021   MG 1.3 (L) 09/15/2021    Bone Lab Results  Component Value Date   VD25OH 18 (L) 12/09/2021    Inflammation (CRP: Acute Phase) (ESR: Chronic Phase) Lab Results  Component Value Date   ESRSEDRATE 19 03/30/2012         Note: Above Lab results reviewed.   Physical Exam  General appearance: Well nourished, well developed, and well hydrated. In no apparent acute distress Mental status: Alert, oriented x 3 (person, place, & time)       Respiratory: No evidence of acute respiratory distress Eyes: PERLA Vitals: BP (!)  177/118 Comment: right arm  Pulse 94   Temp (!) 97.1 F (36.2 C)   Resp 18   Ht $R'5\' 8"'Hp$  (1.727 m)   Wt 198 lb (89.8 kg)   SpO2 98%   BMI 30.11 kg/m  BMI: Estimated body mass index is 30.11 kg/m as calculated from the following:   Height as of this encounter: $RemoveBeforeD'5\' 8"'vtSriVYBdvucxY$  (1.727 m).   Weight as of this encounter: 198 lb (89.8 kg). Ideal: Ideal body weight: 63.9 kg (140 lb 14 oz) Adjusted ideal body weight: 74.3 kg (163 lb 11.6 oz)  Limited range of motion of left knee, pain with weightbearing Arthropathic pain pattern of left knee pain right knee  Assessment   Status Diagnosis  Having a Flare-up Having a Flare-up Having a Flare-up 1. Bilateral primary osteoarthritis of knee   2. Chronic pain syndrome   3. Chronic radicular lumbar pain (left)           Plan of Care   Requested Prescriptions   Signed Prescriptions Disp Refills   HYDROcodone-acetaminophen (NORCO) 7.5-325 MG tablet 105 tablet 0    Sig: Take 1 tablet by mouth every 6 (six) hours as needed for severe pain. Must last 30 days.   HYDROcodone-acetaminophen (NORCO) 7.5-325 MG tablet 105 tablet 0    Sig: Take 1 tablet by mouth every 6 (six) hours as needed for severe pain. Must last 30 days.    Orders Placed This Encounter  Procedures   KNEE INJECTION    Local Anesthetic & Steroid injection.    Standing Status:   Future    Standing Expiration Date:   04/27/2022    Scheduling Instructions:     Side: Bilateral     Timeframe: As soon as schedule allows    Order Specific Question:   Where will this procedure be performed?    Answer:   [facility]      Follow-up plan:   Return in about 22 days (around 02/16/2022) for B/L LA Kenalog .     Status post left genicular RFA 04/16/2018, status post right genicular RFA 03/12/2018.  Provided her with benefit.  Status post left knee intra-articular steroid injection #3 on 11/21/2018.   hyalgan left knee #1 on 04/17/19, #2 05/22/19, #3 07/03/2019, #4 08/28/19, #5 on 09/25/19              Recent Visits Date Type Provider Dept  12/15/21 Office Visit Gillis Santa, MD [facility]  11/10/21 Procedure visit Gillis Santa, MD [facility]  11/02/21 Office Visit Gillis Santa, MD [facility]  Showing recent visits within past 90 days and meeting all other requirements Today's Visits Date Type Provider Dept  01/25/22 Office Visit Gillis Santa, MD [facility]  Showing today's visits and meeting all other requirements Future Appointments Date Type Provider Dept  02/16/22 Appointment Gillis Santa, MD [facility]  04/19/22 Appointment Gillis Santa, MD [facility]  Showing future appointments within next 90 days and meeting all other requirements  I discussed the assessment and treatment plan with the patient. The patient was provided an opportunity to ask questions and all were answered. The patient agreed with the plan and demonstrated an understanding of the instructions.  Patient advised to call back or seek an in-person evaluation if the symptoms or condition worsens.  Duration of encounter: 71minutes.  Note by: Gillis Santa, MD Date: 01/25/2022; Time: 11:07 AM

## 2022-01-25 NOTE — Telephone Encounter (Signed)
Requested Prescriptions  Pending Prescriptions Disp Refills  . lisinopril-hydrochlorothiazide (ZESTORETIC) 20-12.5 MG tablet 90 tablet 3    Sig: Take 1 tablet by mouth daily.     Cardiovascular:  ACEI + Diuretic Combos Failed - 01/25/2022  4:30 PM      Failed - Cr in normal range and within 180 days    Creat  Date Value Ref Range Status  10/05/2021 1.23 (H) 0.50 - 1.05 mg/dL Final         Failed - Last BP in normal range    BP Readings from Last 1 Encounters:  01/25/22 (!) 177/118         Passed - Na in normal range and within 180 days    Sodium  Date Value Ref Range Status  10/05/2021 143 135 - 146 mmol/L Final  08/29/2017 141 134 - 144 mmol/L Final         Passed - K in normal range and within 180 days    Potassium  Date Value Ref Range Status  10/05/2021 3.8 3.5 - 5.3 mmol/L Final         Passed - eGFR is 30 or above and within 180 days    GFR, Est African American  Date Value Ref Range Status  01/02/2020 61 > OR = 60 mL/min/1.29m2 Final   GFR, Est Non African American  Date Value Ref Range Status  01/02/2020 52 (L) > OR = 60 mL/min/1.61m2 Final   GFR, Estimated  Date Value Ref Range Status  04/07/2020 >60 >60 mL/min Final    Comment:    (NOTE) Calculated using the CKD-EPI Creatinine Equation (2021)    GFR  Date Value Ref Range Status  01/21/2015 60.34 >60.00 mL/min Final   eGFR  Date Value Ref Range Status  10/05/2021 48 (L) > OR = 60 mL/min/1.14m2 Final    Comment:    The eGFR is based on the CKD-EPI 2021 equation. To calculate  the new eGFR from a previous Creatinine or Cystatin C result, go to https://www.kidney.org/professionals/ kdoqi/gfr%5Fcalculator          Passed - Patient is not pregnant      Passed - Valid encounter within last 6 months    Recent Outpatient Visits          1 month ago B12 deficiency   [facility], Coralie Keens, NP   3 months ago Episodic migraine   [place], DO   3 months ago Encounter for general adult medical examination with abnormal findings   [facility] [place], Coralie Keens, NP   4 months ago B12 deficiency   [facility], Coralie Keens, NP   5 months ago B12 deficiency   [facility], Coralie Keens, NP      Future Appointments            In 2 months Ba[place], Coralie Keens, NP [facility], [facility]

## 2022-01-25 NOTE — Progress Notes (Signed)
Nursing Pain Medication Assessment:  Safety precautions to be maintained throughout the outpatient stay will include: orient to surroundings, keep bed in low position, maintain call bell within reach at all times, provide assistance with transfer out of bed and ambulation.  Medication Inspection Compliance: Pill count conducted under aseptic conditions, in front of the patient. Neither the pills nor the bottle was removed from the patient's sight at any time. Once count was completed pills were immediately returned to the patient in their original bottle.  Medication: Hydrocodone/APAP Pill/Patch Count:  9 of 105 pills remain Pill/Patch Appearance: Markings consistent with prescribed medication Bottle Appearance: Standard pharmacy container. Clearly labeled. Filled Date: 09 / 21 / 2023 Last Medication intake:  Yesterday

## 2022-01-25 NOTE — Telephone Encounter (Signed)
Medication Refill - Medication: lisinopril-hydrochlorothiazide (ZESTORETIC) 20-12.5 MG tablet  Patient is all out this med   Has the patient contacted their pharmacy? yes (Agent: If no, request that the patient contact the pharmacy for the refill. If patient does not wish to contact the pharmacy document the reason why and proceed with request.) (Agent: If yes, when and what did the pharmacy advise?)contact pcp  Preferred Pharmacy (with phone number or street name): [place], [place]. Phone:  [phone]  Fax:  [phone]      Has the patient been seen for an appointment in the last year OR does the patient have an upcoming appointment? yes  Agent: Please be advised that RX refills may take up to 3 business days. We ask that you follow-up with your pharmacy.

## 2022-02-16 ENCOUNTER — Other Ambulatory Visit: Payer: Self-pay | Admitting: Student in an Organized Health Care Education/Training Program

## 2022-02-16 ENCOUNTER — Encounter: Payer: Self-pay | Admitting: Student in an Organized Health Care Education/Training Program

## 2022-02-16 ENCOUNTER — Ambulatory Visit
Payer: Medicare HMO | Attending: Student in an Organized Health Care Education/Training Program | Admitting: Student in an Organized Health Care Education/Training Program

## 2022-02-16 VITALS — BP 117/99 | HR 96 | Temp 97.3°F | Ht 68.0 in | Wt 198.0 lb

## 2022-02-16 DIAGNOSIS — G894 Chronic pain syndrome: Secondary | ICD-10-CM

## 2022-02-16 DIAGNOSIS — G8929 Other chronic pain: Secondary | ICD-10-CM

## 2022-02-16 DIAGNOSIS — M1712 Unilateral primary osteoarthritis, left knee: Secondary | ICD-10-CM | POA: Diagnosis not present

## 2022-02-16 DIAGNOSIS — M170 Bilateral primary osteoarthritis of knee: Secondary | ICD-10-CM | POA: Diagnosis not present

## 2022-02-16 MED ORDER — TRIAMCINOLONE ACETONIDE 32 MG IX SRER
32.0000 mg | Freq: Once | INTRA_ARTICULAR | Status: AC
  Administered 2022-02-16: 32 mg via INTRA_ARTICULAR
  Filled 2022-02-16: qty 5

## 2022-02-16 MED ORDER — LIDOCAINE HCL (PF) 2 % IJ SOLN
5.0000 mL | Freq: Once | INTRAMUSCULAR | Status: AC
  Administered 2022-02-16: 5 mL

## 2022-02-16 MED ORDER — LIDOCAINE HCL (PF) 2 % IJ SOLN
INTRAMUSCULAR | Status: AC
  Filled 2022-02-16: qty 5

## 2022-02-16 NOTE — Progress Notes (Signed)
PROVIDER NOTE: Interpretation of information contained herein should be left to medically-trained personnel. Specific patient instructions are provided elsewhere under "Patient Instructions" section of medical record. This document was created in part using STT-dictation technology, any transcriptional errors that may result from this process are unintentional.  Patient: Pamela Romero Type: Established DOB: Jun 24, 1950 MRN: [id number] PCP: Jearld Fenton, NP  Service: Procedure DOS: 02/16/2022 Setting: Ambulatory Location: Ambulatory outpatient facility Delivery: Face-to-face Provider: Gillis Santa, MD Specialty: Interventional Pain Management Specialty designation: 09 Location: Outpatient facility Ref. Prov.: Jearld Fenton, NP   Procedure [facility] Interventional Pain Management )    Procedure: Steroid Knee Injection (LONG ACTING Kennalog) #4 (#3 done 01/25/22) Laterality: Bilateral (-50) Level: Intra-articular  No.: R3 L4 Series: n/a Purpose: Therapeutic Indications: Knee arthralgia associated to osteoarthritis of the knee   Imaging: None required (CPT-20610) Analgesia: Skin infiltration w/ local anesthetics Sedation: None  NAS-11 score:   Pre-procedure: 8 /10   Post-procedure: 0-No pain/10      1. Bilateral primary osteoarthritis of knee   2. Primary osteoarthritis of left knee    Pre-Procedure Preparation  Monitoring: As per clinic protocol.  Risk Assessment: Vitals:  OBS:[id number] body mass index is 30.11 kg/m as calculated from the following:   Height as of this encounter: '5\' 8"'$  (1.727 m).   Weight as of this encounter: 198 lb (89.8 kg)., Rate:96 , BP:(!) 117/99, Resp: , Temp:(!) 97.3 F (36.3 C), SpO2:97 %  Allergies: She has No Known Allergies.  Precautions: None required  Blood-thinner(s): None at this time  Coagulopathies: Reviewed. None identified.   Active Infection(s): Reviewed. None identified. [name] is afebrile   Location setting: Exam  room Position: Sitting w/ knee bent 90 degrees Safety Precautions: Patient was assessed for positional comfort and pressure points before starting the procedure. Prepping solution: DuraPrep (Iodine Povacrylex [0.7% available iodine] and Isopropyl Alcohol, 74% w/w) Prep Area: Entire knee region Approach: percutaneous, just above the tibial plateau, medial to the infrapatellar tendon. Intended target: Intra-articular knee space Materials: Tray: Block Needle(s): Regular Qty: 1/side Length: 1.5-inch Gauge: 25G   Meds ordered this encounter  Medications   Triamcinolone Acetonide (ZILRETTA) intra-articular injection 32 mg    Maintain refrigerated.  Prepared suspension may be stored up to 4 hours at ambient conditions.   Triamcinolone Acetonide (ZILRETTA) intra-articular injection 32 mg    Maintain refrigerated.  Prepared suspension may be stored up to 4 hours at ambient conditions.   lidocaine HCl (PF) (XYLOCAINE) 2 % injection 5 mL     No orders of the defined types were placed in this encounter.     Time-out: 1053 I initiated and conducted the "Time-out" before starting the procedure, as per protocol. The patient was asked to participate by confirming the accuracy of the "Time Out" information. Verification of the correct person, site, and procedure were performed and confirmed by me, the nursing staff, and the patient. "Time-out" conducted as per Joint Commission's Universal Protocol (UP.01.01.01). Procedure checklist: Completed   H&P (Pre-op  Assessment)  [name] is a 71 y.o. (year old), female patient, seen today for interventional treatment. She  has a past surgical history that includes Thyroidectomy (2005); Cardiac catheterization (2006); Total abdominal hysterectomy; Breast surgery (Right, March 2014); Breast cyst aspiration (Right, 2014); and Esophagogastroduodenoscopy (N/A, 04/07/2020). [name] has a current medication list which includes the following prescription(s):  albuterol, aspirin ec, atorvastatin, cyanocobalamin, esomeprazole, famotidine, gabapentin, [START ON 02/28/2022] hydrocodone-acetaminophen, [START ON 03/30/2022] hydrocodone-acetaminophen, levothyroxine, lisinopril-hydrochlorothiazide, ondansetron, rizatriptan, sertraline, and vitamin d (  ergocalciferol). Her primarily concern today is the Knee Pain (bilateral)   She has No Known Allergies.   Last encounter: My last encounter with her was on 11/03/2020. Pertinent problems: [name] has Major depressive disorder, recurrent, mild ([place]); Bilateral primary osteoarthritis of knee; and Osteoporosis on their pertinent problem list. Pain Assessment: Severity of Chronic pain is reported as a 8 /10. Location: Knee Right, Left/down legs to feet. Onset: More than a month ago. Quality: Aching, Throbbing. Timing: Constant. Modifying factor(s): meds. Vitals:  height is '5\' 8"'$  (1.727 m) and weight is 198 lb (89.8 kg). Her temporal temperature is 97.3 F (36.3 C) (abnormal). Her blood pressure is 117/99 (abnormal) and her pulse is 96. Her oxygen saturation is 97%.   Reason for encounter: "interventional pain management therapy due pain of at least four (4) weeks in duration, with failure to respond and/or inability to tolerate more conservative care.     Related imaging: Knee-L MR wo contrast:  Results for orders placed during the hospital encounter of 09/01/17  MR KNEE LEFT WO CONTRAST  Narrative CLINICAL DATA:  Persistent anterior knee pain for 1.5 months.  EXAM: MRI OF THE LEFT KNEE WITHOUT CONTRAST  TECHNIQUE: Multiplanar, multisequence MR imaging of the knee was performed. No intravenous contrast was administered.  COMPARISON:  None.  FINDINGS: MENISCI  Medial meniscus: There is an extensive horizontal tear posterior horn extending from the superior surface to the periphery. The midbody is degenerated and peripherally subluxed.  Lateral meniscus: Extensive horizontal tear involving the  anterior horn and midbody with small parameniscal cysts at the level of the midbody best seen on image 34 of series 7 and series 31 of series 5.  LIGAMENTS  Cruciates:  Intact.  Collaterals:  Intact.  CARTILAGE  Patellofemoral: Partial-thickness cartilage loss of the superior aspect of the medial facet of the patella.  Medial: Denuding of the articular cartilage of the medial tibial plateau with subcortical edema and subcortical cyst formation in the periphery of the tibial plateau. Thinning of the articular cartilage of the femoral condyle.  Lateral: Focal full-thickness cartilage loss in the posterior aspect of the lateral tibial plateau.  Joint: Trace joint effusion. Normal Hoffa's fat pad. No plical thickening.  Popliteal Fossa:  Small Baker's cyst.  Intact popliteus tendon.  Extensor Mechanism:  Normal.  Bones: Moderate marginal osteophytes in the medial compartment. Tiny marginal osteophytes in the lateral compartment.  Other: None  IMPRESSION: 1. Extensive horizontal tear of the posterior horn of the medial meniscus. 2. Extensive horizontal tear of the anterior horn and midbody of the lateral meniscus. 3. Moderate osteoarthritis of the medial compartment with full-thickness cartilage loss and subcortical edema and subcortical cyst formation in the medial tibial plateau.   Electronically Signed By: Lorriane Shire M.D. On: 09/01/2017 10:59 \    Site Confirmation: Ms. Pagliarulo was asked to confirm the procedure and laterality before marking the site.  Consent: Before the procedure and under the influence of no sedative(s), amnesic(s), or anxiolytics, the patient was informed of the treatment options, risks and possible complications. To fulfill our ethical and legal obligations, as recommended by the American Medical Association's Code of Ethics, I have informed the patient of my clinical impression; the nature and purpose of the treatment or procedure; the  risks, benefits, and possible complications of the intervention; the alternatives, including doing nothing; the risk(s) and benefit(s) of the alternative treatment(s) or procedure(s); and the risk(s) and benefit(s) of doing nothing. The patient was provided information about the general  risks and possible complications associated with the procedure. These may include, but are not limited to: failure to achieve desired goals, infection, bleeding, organ or nerve damage, allergic reactions, paralysis, and death. In addition, the patient was informed of those risks and complications associated to Spine-related procedures, such as failure to decrease pain; infection (i.e.: Meningitis, epidural or intraspinal abscess); bleeding (i.e.: epidural hematoma, subarachnoid hemorrhage, or any other type of intraspinal or peri-dural bleeding); organ or nerve damage (i.e.: Any type of peripheral nerve, nerve root, or spinal cord injury) with subsequent damage to sensory, motor, and/or autonomic systems, resulting in permanent pain, numbness, and/or weakness of one or several areas of the body; allergic reactions; (i.e.: anaphylactic reaction); and/or death. Furthermore, the patient was informed of those risks and complications associated with the medications. These include, but are not limited to: allergic reactions (i.e.: anaphylactic or anaphylactoid reaction(s)); adrenal axis suppression; blood sugar elevation that in diabetics may result in ketoacidosis or comma; water retention that in patients with history of congestive heart failure may result in shortness of breath, pulmonary edema, and decompensation with resultant heart failure; weight gain; swelling or edema; medication-induced neural toxicity; particulate matter embolism and blood vessel occlusion with resultant organ, and/or nervous system infarction; and/or aseptic necrosis of one or more joints. Finally, the patient was informed that Medicine is not an exact  science; therefore, there is also the possibility of unforeseen or unpredictable risks and/or possible complications that may result in a catastrophic outcome. The patient indicated having understood very clearly. We have given the patient no guarantees and we have made no promises. Enough time was given to the patient to ask questions, all of which were answered to the patient's satisfaction. Ms. Goodgame has indicated that she wanted to continue with the procedure. Attestation: I, the ordering provider, attest that I have discussed with the patient the benefits, risks, side-effects, alternatives, likelihood of achieving goals, and potential problems during recovery for the procedure that I have provided informed consent.  Date  Time: 02/16/2022 10:26 AM   Prophylactic antibiotics  Anti-infectives (From admission, onward)    None      Indication(s): None identified   Description of procedure   Start Time: 1053 hrs  Local Anesthesia: Once the patient was positioned, prepped, and time-out was completed. The target area was identified located. The skin was marked with an approved surgical skin marker. Once marked, the skin (epidermis, dermis, and hypodermis), and deeper tissues (fat, connective tissue and muscle) were infiltrated with a small amount of a short-acting local anesthetic, loaded on a 10cc syringe with a 25G, 1.5-in  Needle. An appropriate amount of time was allowed for local anesthetics to take effect before proceeding to the next step. Local Anesthetic: Lidocaine 1-2% The unused portion of the local anesthetic was discarded in the proper designated containers. Safety Precautions: Aspiration looking for blood return was conducted prior to all injections. At no point did I inject any substances, as a needle was being advanced. Before injecting, the patient was told to immediately notify me if she was experiencing any new onset of "ringing in the ears, or metallic taste in the mouth". No  attempts were made at seeking any paresthesias. Safe injection practices and needle disposal techniques used. Medications properly checked for expiration dates. SDV (single dose vial) medications used. After the completion of the procedure, all disposable equipment used was discarded in the proper designated medical waste containers.  Technical description: Protocol guidelines were followed. After positioning, the target area was  identified and prepped in the usual manner. Skin & deeper tissues infiltrated with local anesthetic. Appropriate amount of time allowed to pass for local anesthetics to take effect. Proper needle placement secured. Once satisfactory needle placement was confirmed, I proceeded to inject the desired solution in slow, incremental fashion, intermittently assessing for discomfort or any signs of abnormal or undesired spread of substance. Once completed, the needle was removed and disposed of, as per hospital protocols. The area was cleaned, making sure to leave some of the prepping solution back to take advantage of its long term bactericidal properties.  Aspiration:  Negative   Vitals:   02/16/22 1029  BP: (!) 117/99  Pulse: 96  Temp: (!) 97.3 F (36.3 C)  TempSrc: Temporal  SpO2: 97%  Weight: 198 lb (89.8 kg)  Height: '5\' 8"'$  (1.727 m)     End Time: 1059 hrs     Post-op assessment  Post-procedure Vital Signs:  Pulse/HCG Rate: 96  Temp: (!) 97.3 F (36.3 C)  Resp:   BP: (!) 117/99 SpO2: 97 %  EBL: None  Complications: No immediate post-treatment complications observed by team, or reported by patient.  Note: The patient tolerated the entire procedure well. A repeat set of vitals were taken after the procedure and the patient was kept under observation following institutional policy, for this type of procedure. Post-procedural neurological assessment was performed, showing return to baseline, prior to discharge. The patient was provided with post-procedure  discharge instructions, including a section on how to identify potential problems. Should any problems arise concerning this procedure, the patient was given instructions to immediately contact us, at any time, without hesitation. In any case, we plan to contact the patient by telephone for a follow-up status report regarding this interventional procedure.  Comments:  No additional relevant information.   Plan of care  Chronic Opioid Analgesic:  Hydrocodone 7.5 mg 3 times daily as needed with an extra quantity 15/month (#105)For severe breakthrough pain    Medications administered: We administered Triamcinolone Acetonide, Triamcinolone Acetonide, and lidocaine HCl (PF).  5 cc of triamcinolone injected in each knee.  Follow-up plan:   Return keep scheduled appt.     Recent Visits Date Type Provider Dept  01/25/22 Office Visit Gillis Santa, MD [facility]  12/15/21 Office Visit Gillis Santa, MD [facility]  Showing recent visits within past 90 days and meeting all other requirements Today's Visits Date Type Provider Dept  02/16/22 Procedure visit Gillis Santa, MD [facility]  Showing today's visits and meeting all other requirements Future Appointments Date Type Provider Dept  04/19/22 Appointment Gillis Santa, MD [facility]  Showing future appointments within next 90 days and meeting all other requirements   Disposition: Discharge home  Discharge (Date  Time): 02/16/2022; 1059 hrs.   Primary Care Physician: Jearld Fenton, NP Location: [facility] Note by: Gillis Santa, MD Date: 02/16/2022; Time: 11:58 AM  DISCLAIMER: Medicine is not an exact science. It has no guarantees or warranties. The decision to proceed with this intervention was based on the information collected from the patient. Conclusions were drawn from the patient's questionnaire, interview, and examination. Because information was provided in  large part by the patient, it cannot be guaranteed that it has not been purposely or unconsciously manipulated or altered. Every effort has been made to obtain as much accurate, relevant, available data as possible. Always take into account that the treatment will also be dependent on availability of resources and  existing treatment guidelines, considered by other Pain Management Specialists as being common knowledge and practice, at the time of the intervention. It is also important to point out that variation in procedural techniques and pharmacological choices are the acceptable norm. For Medico-Legal review purposes, the indications, contraindications, technique, and results of the these procedures should only be evaluated, judged and interpreted by a Board-Certified Interventional Pain Specialist with extensive familiarity and expertise in the same exact procedure and technique.

## 2022-02-16 NOTE — Progress Notes (Signed)
Safety precautions to be maintained throughout the outpatient stay will include: orient to surroundings, keep bed in low position, maintain call bell within reach at all times, provide assistance with transfer out of bed and ambulation.  

## 2022-02-16 NOTE — Patient Instructions (Signed)

## 2022-02-17 ENCOUNTER — Telehealth: Payer: Self-pay

## 2022-03-09 ENCOUNTER — Ambulatory Visit (INDEPENDENT_AMBULATORY_CARE_PROVIDER_SITE_OTHER): Payer: Medicare HMO | Admitting: Internal Medicine

## 2022-03-09 ENCOUNTER — Encounter: Payer: Self-pay | Admitting: Internal Medicine

## 2022-03-09 VITALS — BP 122/76 | HR 106 | Temp 96.0°F | Wt 200.0 lb

## 2022-03-09 DIAGNOSIS — K219 Gastro-esophageal reflux disease without esophagitis: Secondary | ICD-10-CM | POA: Diagnosis not present

## 2022-03-09 DIAGNOSIS — R1310 Dysphagia, unspecified: Secondary | ICD-10-CM | POA: Diagnosis not present

## 2022-03-09 DIAGNOSIS — K5909 Other constipation: Secondary | ICD-10-CM

## 2022-03-09 DIAGNOSIS — R112 Nausea with vomiting, unspecified: Secondary | ICD-10-CM

## 2022-03-09 MED ORDER — ONDANSETRON 4 MG PO TBDP: 4.0000 mg | ORAL_TABLET | Freq: Every day | ORAL | 0 refills | Status: DC | PRN

## 2022-03-09 MED ORDER — DOCUSATE SODIUM 100 MG PO CAPS: 100.0000 mg | ORAL_CAPSULE | Freq: Every evening | ORAL | 0 refills | Status: DC | PRN

## 2022-03-09 MED ORDER — POLYETHYLENE GLYCOL 3350 17 GM/SCOOP PO POWD: 17.0000 g | Freq: Every day | ORAL | 1 refills | Status: AC

## 2022-03-09 NOTE — Progress Notes (Signed)
HPI  Pt presents to the clinic today with c/o sore throat, abdominal pain, nausea and vomiting. She reports this started 1 month ago. She reports associated sweating, only when she vomits. She reports these symptoms occur daily. She reports constipation but this is a chronic issue for her. She denies diarrhea or blood in her stool. She denies recent changes in diet and medications. She has not tried anything OTC for this. She has a history of GERD, managed on Esomeprazole and Famotidine. She has difficulty swallowing despite taking these medications. She takes Correctall for her bowels, and reports she has a BM only 1 x week.  Review of Systems      Past Medical History:  Diagnosis Date   Abnormal EKG    CAD (coronary artery disease)    non critical, by cardiac cath 2006   Cancer [facility])    endometrial Ca   COPD (chronic obstructive pulmonary disease) (HCC)    Endometrial adenocarcinoma ([place]) 2011   Hyperlipidemia    Hypertension    Osteoarthritis    Right foot, Left knee (Dr. Roland Rack)   Reflux gastritis    Thyroid disease    tobacco abuse    Vaginal delivery    x 2    Family History  Problem Relation Age of Onset   Alcohol abuse Father    Cancer Maternal Aunt        stomach   Alcohol abuse Maternal Aunt    Alcohol abuse Mother    Heart disease Brother        MI age 20- non smoker   Cancer Brother        Pancreatic   Cancer Brother        colon, dx'd age 23   Cancer Brother        stomach, paternal half brother   Breast cancer Neg Hx     Social History   Socioeconomic History   Marital status: Married    Spouse name: Not on file   Number of children: 0   Years of education: Not on file   Highest education level: Not on file  Occupational History   Occupation: retired   Tobacco Use   Smoking status: Former    Packs/day: 0.50    Years: 50.00    Total pack years: 25.00    Types: Cigarettes    Quit date: 05/11/2018    Years since quitting: 3.8   Smokeless  tobacco: Former  Scientific laboratory technician Use: Never used  Substance and Sexual Activity   Alcohol use: No   Drug use: No   Sexual activity: Not on file  Other Topics Concern   Not on file  Social History Narrative   Lives with spouse, takes care of her mother.   Had 2 children, one died at birth, the other given up for adoption (was unmarried).   Social Determinants of Health   Financial Resource Strain: Medium Risk (06/13/2019)   Overall Financial Resource Strain (CARDIA)    Difficulty of Paying Living Expenses: Somewhat hard  Food Insecurity: No Food Insecurity (06/13/2019)   Hunger Vital Sign    Worried About Running Out of Food in the Last Year: Never true    Ran Out of Food in the Last Year: Never true  Transportation Needs: No Transportation Needs (06/13/2019)   PRAPARE - Hydrologist (Medical): No    Lack of Transportation (Non-Medical): No  Physical Activity: Insufficiently Active (12/13/2019)   Exercise  Vital Sign    Days of Exercise per Week: 4 days    Minutes of Exercise per Session: 30 min  Stress: No Stress Concern Present (06/13/2019)   [place]    Feeling of Stress : Only a little  Social Connections: Not on file  Intimate Partner Violence: Not At Risk (08/08/2019)   Humiliation, Afraid, Rape, and Kick questionnaire    Fear of Current or Ex-Partner: No    Emotionally Abused: No    Physically Abused: No    Sexually Abused: No    No Known Allergies   Constitutional: Denies fatigue, fever or abrupt weight changes.  Respiratory: Denies cough, difficulty breathing or shortness of breath.  Cardiovascular: Denies chest pain, chest tightness, palpitations or swelling in the hands or feet.  GI: Pt reports difficulty swallowing, abdominal pain, constipation, vomiting. Denies diarrhea or blood in stool.  No other specific complaints in a complete review of systems (except as listed  in HPI above).  Objective:   BP 122/76 (BP Location: Right Arm, Patient Position: Sitting, Cuff Size: Normal)   Pulse (!) 106   Temp (!) 96 F (35.6 C) (Temporal)   Wt 200 lb (90.7 kg)   SpO2 100%   BMI 30.41 kg/m   Wt Readings from Last 3 Encounters:  02/16/22 198 lb (89.8 kg)  01/25/22 198 lb (89.8 kg)  12/09/21 205 lb (93 kg)     General: Appears her stated age, obese, in NAD. HEENT: Head: normal shape and size; Eyes: sclera white, no icterus, conjunctiva pink; Ears: Tm's gray and intact, normal light reflex;  Cardiovascular: Normal rate and rhythm. S1,S2 noted.  No murmur, rubs or gallops noted.  Pulmonary/Chest: Normal effort and positive vesicular breath sounds. No respiratory distress. No wheezes, rales or ronchi noted.  Abdomen: Hypoactive bowel sounds. Soft and nontender. No distention or masses noted. Neuro: Alert and oriented.     Assessment & Plan:   Dysphagia, Nausea, Vomiting, Constipation:  Continue Famotidine and Esomeprazole RX for Mirilax 17 gm PO daily RX for Colace 100 mg PO QHS Zofran refilled Referral to GI for further evaluation, possible EGD and colonocospy Encouraged high fiber diet, adequate water intake  RTC in 1 months for follow-up of chronic conditions   [name], NP

## 2022-03-09 NOTE — Patient Instructions (Signed)

## 2022-04-07 ENCOUNTER — Encounter: Payer: Medicare HMO | Admitting: Internal Medicine

## 2022-04-08 ENCOUNTER — Encounter: Payer: Self-pay | Admitting: Internal Medicine

## 2022-04-08 ENCOUNTER — Ambulatory Visit (INDEPENDENT_AMBULATORY_CARE_PROVIDER_SITE_OTHER): Payer: Medicare HMO | Admitting: Internal Medicine

## 2022-04-08 VITALS — BP 129/87 | HR 97 | Temp 98.1°F | Resp 18 | Ht 68.0 in | Wt 202.0 lb

## 2022-04-08 DIAGNOSIS — K2100 Gastro-esophageal reflux disease with esophagitis, without bleeding: Secondary | ICD-10-CM

## 2022-04-08 DIAGNOSIS — M170 Bilateral primary osteoarthritis of knee: Secondary | ICD-10-CM

## 2022-04-08 DIAGNOSIS — N1831 Chronic kidney disease, stage 3a: Secondary | ICD-10-CM | POA: Diagnosis not present

## 2022-04-08 DIAGNOSIS — J449 Chronic obstructive pulmonary disease, unspecified: Secondary | ICD-10-CM | POA: Insufficient documentation

## 2022-04-08 DIAGNOSIS — G894 Chronic pain syndrome: Secondary | ICD-10-CM

## 2022-04-08 DIAGNOSIS — F419 Anxiety disorder, unspecified: Secondary | ICD-10-CM

## 2022-04-08 DIAGNOSIS — G43909 Migraine, unspecified, not intractable, without status migrainosus: Secondary | ICD-10-CM

## 2022-04-08 DIAGNOSIS — E890 Postprocedural hypothyroidism: Secondary | ICD-10-CM | POA: Diagnosis not present

## 2022-04-08 DIAGNOSIS — D692 Other nonthrombocytopenic purpura: Secondary | ICD-10-CM | POA: Diagnosis not present

## 2022-04-08 DIAGNOSIS — M810 Age-related osteoporosis without current pathological fracture: Secondary | ICD-10-CM

## 2022-04-08 DIAGNOSIS — Z23 Encounter for immunization: Secondary | ICD-10-CM | POA: Diagnosis not present

## 2022-04-08 DIAGNOSIS — E6609 Other obesity due to excess calories: Secondary | ICD-10-CM

## 2022-04-08 DIAGNOSIS — Z6830 Body mass index (BMI) 30.0-30.9, adult: Secondary | ICD-10-CM

## 2022-04-08 DIAGNOSIS — E785 Hyperlipidemia, unspecified: Secondary | ICD-10-CM

## 2022-04-08 DIAGNOSIS — F5104 Psychophysiologic insomnia: Secondary | ICD-10-CM

## 2022-04-08 DIAGNOSIS — I10 Essential (primary) hypertension: Secondary | ICD-10-CM | POA: Diagnosis not present

## 2022-04-08 DIAGNOSIS — J410 Simple chronic bronchitis: Secondary | ICD-10-CM

## 2022-04-08 DIAGNOSIS — F32A Depression, unspecified: Secondary | ICD-10-CM

## 2022-04-08 NOTE — Progress Notes (Signed)
Subjective:    Patient ID: Pamela Romero, female    DOB: Nov 13, 1950, 71 y.o.   MRN: [id number]  HPI  Patient presents to clinic today for follow-up of chronic conditions.  HTN: Her BP today is 129/87.  She is taking Lisinopril HCT as prescribed.  ECG from 05/2010 reviewed.  CKD 3: Her last creatinine was 1.23, GFR 52, 09/2021.  She is on Lisinopril for renal protection.  She does not follow with nephrology.  GERD: Triggered by NSAID use.  She denies has some breakthrough on Esomeprazole and Famotidine.  Upper GI from 03/2020 reviewed. She has been referred to GI for further evaluation  COPD: She denies chronic cough or shortness of breath.  She uses Albuterol as needed with good relief of symptoms.  There is no PFTs on file.  She does not follow with pulmonary.  Migraines: Triggered by stress.  These occur 3 times per week.  She takes Maxalt as needed with good relief of symptoms.  She does not follow with neurology.  Anxiety and Depression: Chronic, managed on Sertraline.  She is not seeing a therapist.  She denies SI/HI.  Chronic Pain Syndrome/OA: Generalized.  Managed with Hydrocodone and Gabapentin.  She follows with pain management.  Hypothyroidism: She denies any issues on her current dose of Levothyroxine.  She does not follow with endocrinology.  HLD: Her last LDL was 142, triglycerides 119, 09/2021 .  She is not taking any cholesterol-lowering medication at this time.  She tries to consume a low-fat diet.  History of Endometrial Cancer: Status post hysterectomy and radiation.  She no longer follows with gynecology oncology.  Insomnia: She has difficulty staying asleep.  She takes Gabapentin with some relief of symptoms.  Sleep study from 12/2019 reviewed.  Osteoporosis: She is not taking any medication for this at this time.  She does try to get in some weightbearing exercise.  Thrombocytopenia: Her last platelet count was 139, 09/2021.  She does not follow with  hematology.  Review of Systems     Past Medical History:  Diagnosis Date   Abnormal EKG    CAD (coronary artery disease)    non critical, by cardiac cath 2006   Cancer [facility])    endometrial Ca   COPD (chronic obstructive pulmonary disease) ([place])    Endometrial adenocarcinoma ([place]) 2011   Hyperlipidemia    Hypertension    Osteoarthritis    Right foot, Left knee (Dr. Roland Rack)   Reflux gastritis    Thyroid disease    tobacco abuse    Vaginal delivery    x 2    Current Outpatient Medications  Medication Sig Dispense Refill   albuterol (PROAIR HFA) 108 (90 Base) MCG/ACT inhaler Inhale 1-2 puffs into the lungs every 6 (six) hours as needed for wheezing or shortness of breath. 6.7 g 1   aspirin EC 81 MG tablet Take 1 tablet (81 mg total) by mouth daily. Swallow whole. 30 tablet 12   atorvastatin (LIPITOR) 10 MG tablet Take 1 tablet (10 mg total) by mouth daily. 30 tablet 2   cyanocobalamin (,VITAMIN B-12,) 1000 MCG/ML injection Inject 1 ML SQ every 2 weeks x 2 months 4 mL 0   docusate sodium (COLACE) 100 MG capsule Take 1 capsule (100 mg total) by mouth at bedtime as needed for mild constipation. 90 capsule 0   esomeprazole (NEXIUM) 40 MG capsule Take 1 capsule (40 mg total) by mouth 2 (two) times daily before a meal. 180 capsule 0  famotidine (PEPCID) 20 MG tablet Take 1 tablet (20 mg total) by mouth at bedtime. 90 tablet 1   gabapentin (NEURONTIN) 300 MG capsule Take 1-2 capsules (300-600 mg total) by mouth at bedtime. 180 capsule 2   HYDROcodone-acetaminophen (NORCO) 7.5-325 MG tablet Take 1 tablet by mouth every 6 (six) hours as needed for severe pain. Must last 30 days. 105 tablet 0   levothyroxine (SYNTHROID) 150 MCG tablet Take 1 tablet (150 mcg total) by mouth daily. 90 tablet 3   lisinopril-hydrochlorothiazide (ZESTORETIC) 20-12.5 MG tablet Take 1 tablet by mouth daily. 90 tablet 0   ondansetron (ZOFRAN-ODT) 4 MG disintegrating tablet Take 1 tablet (4 mg total) by mouth daily as  needed for nausea or vomiting. 30 tablet 0   polyethylene glycol powder (GLYCOLAX/MIRALAX) 17 GM/SCOOP powder Take 17 g by mouth daily. 3350 g 1   rizatriptan (MAXALT-MLT) 10 MG disintegrating tablet Take 1 tablet (10 mg total) by mouth as needed for migraine. May repeat in 2 hours if needed 10 tablet 2   sertraline (ZOLOFT) 50 MG tablet Take 1/2 tab PO x 10 days then increase to 1 tab daily thereafter 90 tablet 1   Vitamin D, Ergocalciferol, (DRISDOL) 1.25 MG (50000 UNIT) CAPS capsule Take 1 capsule (50,000 Units total) by mouth once a week. For 12 weeks. Then start OTC Vitamin D3 2,000 unit daily. 12 capsule 0   No current facility-administered medications for this visit.    No Known Allergies  Family History  Problem Relation Age of Onset   Alcohol abuse Father    Cancer Maternal Aunt        stomach   Alcohol abuse Maternal Aunt    Alcohol abuse Mother    Heart disease Brother        MI age 76- non smoker   Cancer Brother        Pancreatic   Cancer Brother        colon, dx'd age 54   Cancer Brother        stomach, paternal half brother   Breast cancer Neg Hx     Social History   Socioeconomic History   Marital status: Married    Spouse name: Not on file   Number of children: 0   Years of education: Not on file   Highest education level: Not on file  Occupational History   Occupation: retired   Tobacco Use   Smoking status: Former    Packs/day: 0.50    Years: 50.00    Total pack years: 25.00    Types: Cigarettes    Quit date: 05/11/2018    Years since quitting: 3.9   Smokeless tobacco: Former  Scientific laboratory technician Use: Never used  Substance and Sexual Activity   Alcohol use: No   Drug use: No   Sexual activity: Not on file  Other Topics Concern   Not on file  Social History Narrative   Lives with spouse, takes care of her mother.   Had 2 children, one died at birth, the other given up for adoption (was unmarried).   Social Determinants of Health    Financial Resource Strain: Medium Risk (06/13/2019)   Overall Financial Resource Strain (CARDIA)    Difficulty of Paying Living Expenses: Somewhat hard  Food Insecurity: No Food Insecurity (06/13/2019)   Hunger Vital Sign    Worried About Running Out of Food in the Last Year: Never true    Ran Out of Food in the Last Year:  Never true  Transportation Needs: No Transportation Needs (06/13/2019)   PRAPARE - Hydrologist (Medical): No    Lack of Transportation (Non-Medical): No  Physical Activity: Insufficiently Active (12/13/2019)   Exercise Vital Sign    Days of Exercise per Week: 4 days    Minutes of Exercise per Session: 30 min  Stress: No Stress Concern Present (06/13/2019)   [place]    Feeling of Stress : Only a little  Social Connections: Not on file  Intimate Partner Violence: Not At Risk (08/08/2019)   Humiliation, Afraid, Rape, and Kick questionnaire    Fear of Current or Ex-Partner: No    Emotionally Abused: No    Physically Abused: No    Sexually Abused: No     Constitutional: Patient reports headaches.  Denies fever, malaise, fatigue, or abrupt weight changes.  HEENT: Denies eye pain, eye redness, ear pain, ringing in the ears, wax buildup, runny nose, nasal congestion, bloody nose, or sore throat. Respiratory: Denies difficulty breathing, shortness of breath, cough or sputum production.   Cardiovascular: Denies chest pain, chest tightness, palpitations or swelling in the hands or feet.  Gastrointestinal: Patient reports remittent vomiting.  Denies abdominal pain, bloating, constipation, diarrhea or blood in the stool.  GU: Denies urgency, frequency, pain with urination, burning sensation, blood in urine, odor or discharge. Musculoskeletal: Patient reports chronic joint muscle pain.  Denies decrease in range of motion, difficulty with gait, or joint swelling.  Skin: Denies redness,  rashes, lesions or ulcercations.  Neurological: Patient reports insomnia.  Denies dizziness, difficulty with memory, difficulty with speech or problems with balance and coordination.  Psych: Patient has a history of anxiety and depression.  Denies SI/HI.  No other specific complaints in a complete review of systems (except as listed in HPI above).  Objective:   Physical Exam  BP 129/87 (BP Location: Right Arm, Patient Position: Sitting, Cuff Size: Normal)   Pulse 97   Temp 98.1 F (36.7 C) (Oral)   Resp 18   Ht '5\' 8"'$  (1.727 m)   Wt 202 lb (91.6 kg)   SpO2 98%   BMI 30.71 kg/m   Wt Readings from Last 3 Encounters:  03/09/22 200 lb (90.7 kg)  02/16/22 198 lb (89.8 kg)  01/25/22 198 lb (89.8 kg)    General: Appears her stated age, obese in NAD. Skin: Warm, dry and intact.  No purpura noted. HEENT: Head: normal shape and size; Eyes: sclera white, no icterus, conjunctiva pink, PERRLA and EOMs intact;  Neck:  Neck supple, trachea midline. No masses, lumps or thyromegaly present.  Cardiovascular: Normal rate and rhythm. S1,S2 noted.  No murmur, rubs or gallops noted. No JVD or BLE edema. No carotid bruits noted. Pulmonary/Chest: Normal effort and positive vesicular breath sounds. No respiratory distress. No wheezes, rales or ronchi noted.  Abdomen: Soft and nontender. Normal bowel sounds.  Musculoskeletal: No difficulty with gait.  Neurological: Alert and oriented. Cranial nerves II-XII grossly intact. Coordination normal.  Psychiatric: Mood and affect normal. Behavior is normal. Judgment and thought content normal.     BMET    Component Value Date/Time   NA 143 10/05/2021 0921   NA 141 08/29/2017 0950   K 3.8 10/05/2021 0921   CL 107 10/05/2021 0921   CO2 27 10/05/2021 0921   GLUCOSE 83 10/05/2021 0921   BUN 18 10/05/2021 0921   BUN 19 08/29/2017 0950   CREATININE 1.23 (H) 10/05/2021 9983  CALCIUM 9.5 10/05/2021 0921   GFRNONAA >60 04/07/2020 1022   GFRNONAA 52 (L)  01/02/2020 1453   GFRAA 61 01/02/2020 1453    Lipid Panel     Component Value Date/Time   CHOL 212 (H) 10/05/2021 0921   TRIG 119 10/05/2021 0921   HDL 46 (L) 10/05/2021 0921   CHOLHDL 4.6 10/05/2021 0921   VLDL 42.0 (H) 05/14/2014 0918   LDLCALC 142 (H) 10/05/2021 0921    CBC    Component Value Date/Time   WBC 6.3 12/09/2021 1112   RBC 3.87 12/09/2021 1112   HGB 12.8 12/09/2021 1112   HCT 38.2 12/09/2021 1112   PLT 139 (L) 12/09/2021 1112   MCV 98.7 12/09/2021 1112   MCH 33.1 (H) 12/09/2021 1112   MCHC 33.5 12/09/2021 1112   RDW 13.2 12/09/2021 1112   LYMPHSABS 1,331 10/05/2021 0921   MONOABS 0.4 09/16/2013 1046   EOSABS 61 10/05/2021 0921   BASOSABS 39 10/05/2021 0921    Hgb A1C Lab Results  Component Value Date   HGBA1C 4.9 10/05/2021           Assessment & Plan:     RTC in 6 months for annual exam. [name], NP

## 2022-04-08 NOTE — Assessment & Plan Note (Signed)
Continue hydrocodone and gabapentin for pain management

## 2022-04-08 NOTE — Assessment & Plan Note (Signed)
Continue gabapentin and hydrocodone for pain management

## 2022-04-08 NOTE — Assessment & Plan Note (Signed)
Try to reduce stress Continue Maxalt as needed

## 2022-04-08 NOTE — Patient Instructions (Signed)

## 2022-04-08 NOTE — Assessment & Plan Note (Signed)
CBC today.  

## 2022-04-08 NOTE — Assessment & Plan Note (Signed)
C-Met and lipid profile today Encouraged her to consume low-fat diet 

## 2022-04-08 NOTE — Assessment & Plan Note (Signed)
Encourage diet and exercise for weight loss 

## 2022-04-08 NOTE — Assessment & Plan Note (Signed)
Continue gabapentin.

## 2022-04-08 NOTE — Assessment & Plan Note (Signed)
Controlled on lisinopril HCT Reinforced diet and exercise weight loss C-Met today 

## 2022-04-08 NOTE — Assessment & Plan Note (Signed)
Continue lisinopril for renal protection 

## 2022-04-08 NOTE — Assessment & Plan Note (Signed)
Continue sertraline Support offered

## 2022-04-08 NOTE — Assessment & Plan Note (Signed)
Try to avoid foods that trigger reflux Encourage weight loss as this can produce reflux symptoms Continue as omeprazole and famotidine She has Zofran if needed for vomiting

## 2022-04-08 NOTE — Assessment & Plan Note (Signed)
TSH and free T4 today We will adjust levothyroxine if needed based on labs 

## 2022-04-08 NOTE — Assessment & Plan Note (Signed)
Encourage weight loss as this can help reduce joint pain Continue hydrocodone and gabapentin for pain management

## 2022-04-08 NOTE — Assessment & Plan Note (Signed)
Continue albuterol as needed 

## 2022-04-08 NOTE — Assessment & Plan Note (Signed)
Encouraged vitamin D and calcium OTC Encouraged daily weightbearing exercise

## 2022-04-09 LAB — COMPLETE METABOLIC PANEL WITH GFR
AG Ratio: 1.6 (calc) (ref 1.0–2.5)
ALT: 7 U/L (ref 6–29)
AST: 10 U/L (ref 10–35)
Albumin: 3.5 g/dL — ABNORMAL LOW (ref 3.6–5.1)
Alkaline phosphatase (APISO): 55 U/L (ref 37–153)
BUN/Creatinine Ratio: 18 (calc) (ref 6–22)
BUN: 20 mg/dL (ref 7–25)
CO2: 27 mmol/L (ref 20–32)
Calcium: 9.6 mg/dL (ref 8.6–10.4)
Chloride: 108 mmol/L (ref 98–110)
Creat: 1.13 mg/dL — ABNORMAL HIGH (ref 0.50–1.05)
Globulin: 2.2 g/dL (calc) (ref 1.9–3.7)
Glucose, Bld: 78 mg/dL (ref 65–99)
Potassium: 4.2 mmol/L (ref 3.5–5.3)
Sodium: 141 mmol/L (ref 135–146)
Total Bilirubin: 0.2 mg/dL (ref 0.2–1.2)
Total Protein: 5.7 g/dL — ABNORMAL LOW (ref 6.1–8.1)
eGFR: 53 mL/min/{1.73_m2} — ABNORMAL LOW (ref >=60)

## 2022-04-09 LAB — CBC
HCT: 30.7 % — ABNORMAL LOW (ref 35.0–45.0)
Hemoglobin: 10.3 g/dL — ABNORMAL LOW (ref 11.7–15.5)
MCH: 32.5 pg (ref 27.0–33.0)
MCHC: 33.6 g/dL (ref 32.0–36.0)
MCV: 96.8 fL (ref 80.0–100.0)
MPV: 10.2 fL (ref 7.5–12.5)
Platelets: 134 10*3/uL — ABNORMAL LOW (ref 140–400)
RBC: 3.17 10*6/uL — ABNORMAL LOW (ref 3.80–5.10)
RDW: 11.8 % (ref 11.0–15.0)
WBC: 5.4 10*3/uL (ref 3.8–10.8)

## 2022-04-09 LAB — LIPID PANEL
Cholesterol: 149 mg/dL (ref <200)
HDL: 46 mg/dL — ABNORMAL LOW (ref >=50)
LDL Cholesterol (Calc): 84 mg/dL (calc)
Non-HDL Cholesterol (Calc): 103 mg/dL (calc) (ref <130)
Total CHOL/HDL Ratio: 3.2 (calc) (ref <5.0)
Triglycerides: 92 mg/dL (ref <150)

## 2022-04-09 LAB — TSH: TSH: 0.09 mIU/L — ABNORMAL LOW (ref 0.40–4.50)

## 2022-04-09 LAB — T4, FREE: Free T4: 2.3 ng/dL — ABNORMAL HIGH (ref 0.8–1.8)

## 2022-04-13 MED ORDER — LEVOTHYROXINE SODIUM 125 MCG PO TABS: 125.0000 ug | ORAL_TABLET | Freq: Every day | ORAL | 1 refills | Status: DC

## 2022-04-13 NOTE — Addendum Note (Signed)
Addended by: Jearld Fenton on: 04/13/2022 02:07 PM   Modules accepted: Orders

## 2022-04-14 ENCOUNTER — Ambulatory Visit (INDEPENDENT_AMBULATORY_CARE_PROVIDER_SITE_OTHER): Payer: Medicare HMO | Admitting: Internal Medicine

## 2022-04-14 ENCOUNTER — Encounter: Payer: Self-pay | Admitting: Internal Medicine

## 2022-04-14 DIAGNOSIS — J441 Chronic obstructive pulmonary disease with (acute) exacerbation: Secondary | ICD-10-CM | POA: Diagnosis not present

## 2022-04-14 MED ORDER — FUROSEMIDE 20 MG PO TABS: 20.0000 mg | ORAL_TABLET | Freq: Every day | ORAL | 0 refills | Status: DC

## 2022-04-14 MED ORDER — ALBUTEROL SULFATE HFA 108 (90 BASE) MCG/ACT IN AERS: 1.0000 | INHALATION_SPRAY | Freq: Four times a day (QID) | RESPIRATORY_TRACT | 1 refills | Status: AC | PRN

## 2022-04-14 NOTE — Patient Instructions (Signed)
Peripheral Edema  Peripheral edema is swelling that is caused by a buildup of fluid. Peripheral edema most often affects the lower legs, ankles, and feet. It can also develop in the arms, hands, and face. The area of the body that has peripheral edema will look swollen. It may also feel heavy or warm. Your clothes may start to feel tight. Pressing on the area may make a temporary dent in your skin (pitting edema). You may not be able to move your swollen arm or leg as much as usual. There are many causes of peripheral edema. It can happen because of a complication of other conditions such as heart failure, kidney disease, or a problem with your circulation. It also can be a side effect of certain medicines or happen because of an infection. It often happens to women during pregnancy. Sometimes, the cause is not known. Follow these instructions at home: Managing pain, stiffness, and swelling  Raise (elevate) your legs while you are sitting or lying down. Move around often to prevent stiffness and to reduce swelling. Do not sit or stand for long periods of time. Do not wear tight clothing. Do not wear garters on your upper legs. Exercise your legs to get your circulation going. This helps to move the fluid back into your blood vessels, and it may help the swelling go down. Wear compression stockings as told by your health care provider. These stockings help to prevent blood clots and reduce swelling in your legs. It is important that these are the correct size. These stockings should be prescribed by your doctor to prevent possible injuries. If elastic bandages or wraps are recommended, use them as told by your health care provider. Medicines Take over-the-counter and prescription medicines only as told by your health care provider. Your health care provider may prescribe medicine to help your body get rid of excess water (diuretic). Take this medicine if you are told to take it. General  instructions Eat a low-salt (low-sodium) diet as told by your health care provider. Sometimes, eating less salt may reduce swelling. Pay attention to any changes in your symptoms. Moisturize your skin daily to help prevent skin from cracking and draining. Keep all follow-up visits. This is important. Contact a health care provider if: You have a fever. You have swelling in only one leg. You have increased swelling, redness, or pain in one or both of your legs. You have drainage or sores at the area where you have edema. Get help right away if: You have edema that starts suddenly or is getting worse, especially if you are pregnant or have a medical condition. You develop shortness of breath, especially when you are lying down. You have pain in your chest or abdomen. You feel weak. You feel like you will faint. These symptoms may be an emergency. Get help right away. Call 911. Do not wait to see if the symptoms will go away. Do not drive yourself to the hospital. Summary Peripheral edema is swelling that is caused by a buildup of fluid. Peripheral edema most often affects the lower legs, ankles, and feet. Move around often to prevent stiffness and to reduce swelling. Do not sit or stand for long periods of time. Pay attention to any changes in your symptoms. Contact a health care provider if you have edema that starts suddenly or is getting worse, especially if you are pregnant or have a medical condition. Get help right away if you develop shortness of breath, especially when lying down.   This information is not intended to replace advice given to you by your health care provider. Make sure you discuss any questions you have with your health care provider. Document Revised: 11/30/2020 Document Reviewed: 11/30/2020 Elsevier Patient Education  2023 [employer]  

## 2022-04-14 NOTE — Progress Notes (Signed)
Subjective:    Patient ID: Pamela Romero, female    DOB: January 18, 1951, 72 y.o.   MRN: [id number]  HPI  Patient presents to clinic today with complaint of swelling in her feet.  She noticed this shortly after Christmas. She denies chest pain or shortness of breath. She is on Lisinopril HCT.  Echo from 11/2018 showed grade 1 diastolic dysfunction. She is on Gabapentin but has not recently had an increased dose.  Review of Systems     Past Medical History:  Diagnosis Date   Abnormal EKG    CAD (coronary artery disease)    non critical, by cardiac cath 2006   Cancer [facility])    endometrial Ca   COPD (chronic obstructive pulmonary disease) ([place])    Endometrial adenocarcinoma ([place]) 2011   Hyperlipidemia    Hypertension    Osteoarthritis    Right foot, Left knee (Dr. Roland Rack)   Reflux gastritis    Thyroid disease    tobacco abuse    Vaginal delivery    x 2    Current Outpatient Medications  Medication Sig Dispense Refill   albuterol (PROAIR HFA) 108 (90 Base) MCG/ACT inhaler Inhale 1-2 puffs into the lungs every 6 (six) hours as needed for wheezing or shortness of breath. (Patient not taking: Reported on 04/08/2022) 6.7 g 1   aspirin EC 81 MG tablet Take 1 tablet (81 mg total) by mouth daily. Swallow whole. 30 tablet 12   atorvastatin (LIPITOR) 10 MG tablet Take 1 tablet (10 mg total) by mouth daily. 30 tablet 2   cyanocobalamin (,VITAMIN B-12,) 1000 MCG/ML injection Inject 1 ML SQ every 2 weeks x 2 months (Patient not taking: Reported on 04/08/2022) 4 mL 0   docusate sodium (COLACE) 100 MG capsule Take 1 capsule (100 mg total) by mouth at bedtime as needed for mild constipation. 90 capsule 0   esomeprazole (NEXIUM) 40 MG capsule Take 1 capsule (40 mg total) by mouth 2 (two) times daily before a meal. 180 capsule 0   famotidine (PEPCID) 20 MG tablet Take 1 tablet (20 mg total) by mouth at bedtime. 90 tablet 1   gabapentin (NEURONTIN) 300 MG capsule Take 1-2 capsules (300-600 mg total)  by mouth at bedtime. 180 capsule 2   HYDROcodone-acetaminophen (NORCO) 7.5-325 MG tablet Take 1 tablet by mouth every 6 (six) hours as needed for severe pain. Must last 30 days. 105 tablet 0   levothyroxine (SYNTHROID) 125 MCG tablet Take 1 tablet (125 mcg total) by mouth daily. 30 tablet 1   lisinopril-hydrochlorothiazide (ZESTORETIC) 20-12.5 MG tablet Take 1 tablet by mouth daily. 90 tablet 0   ondansetron (ZOFRAN-ODT) 4 MG disintegrating tablet Take 1 tablet (4 mg total) by mouth daily as needed for nausea or vomiting. (Patient not taking: Reported on 04/08/2022) 30 tablet 0   polyethylene glycol powder (GLYCOLAX/MIRALAX) 17 GM/SCOOP powder Take 17 g by mouth daily. 3350 g 1   rizatriptan (MAXALT-MLT) 10 MG disintegrating tablet Take 1 tablet (10 mg total) by mouth as needed for migraine. May repeat in 2 hours if needed 10 tablet 2   sertraline (ZOLOFT) 50 MG tablet Take 1/2 tab PO x 10 days then increase to 1 tab daily thereafter 90 tablet 1   Vitamin D, Ergocalciferol, (DRISDOL) 1.25 MG (50000 UNIT) CAPS capsule Take 1 capsule (50,000 Units total) by mouth once a week. For 12 weeks. Then start OTC Vitamin D3 2,000 unit daily. (Patient not taking: Reported on 04/08/2022) 12 capsule 0   No  current facility-administered medications for this visit.    No Known Allergies  Family History  Problem Relation Age of Onset   Alcohol abuse Father    Cancer Maternal Aunt        stomach   Alcohol abuse Maternal Aunt    Alcohol abuse Mother    Heart disease Brother        MI age 42- non smoker   Cancer Brother        Pancreatic   Cancer Brother        colon, dx'd age 59   Cancer Brother        stomach, paternal half brother   Breast cancer Neg Hx     Social History   Socioeconomic History   Marital status: Married    Spouse name: Not on file   Number of children: 0   Years of education: Not on file   Highest education level: Not on file  Occupational History   Occupation: retired    Tobacco Use   Smoking status: Former    Packs/day: 0.50    Years: 50.00    Total pack years: 25.00    Types: Cigarettes    Quit date: 05/11/2018    Years since quitting: 3.9   Smokeless tobacco: Former  Scientific laboratory technician Use: Never used  Substance and Sexual Activity   Alcohol use: No   Drug use: No   Sexual activity: Not on file  Other Topics Concern   Not on file  Social History Narrative   Lives with spouse, takes care of her mother.   Had 2 children, one died at birth, the other given up for adoption (was unmarried).   Social Determinants of Health   Financial Resource Strain: Medium Risk (06/13/2019)   Overall Financial Resource Strain (CARDIA)    Difficulty of Paying Living Expenses: Somewhat hard  Food Insecurity: No Food Insecurity (06/13/2019)   Hunger Vital Sign    Worried About Running Out of Food in the Last Year: Never true    Ran Out of Food in the Last Year: Never true  Transportation Needs: No Transportation Needs (06/13/2019)   PRAPARE - Hydrologist (Medical): No    Lack of Transportation (Non-Medical): No  Physical Activity: Insufficiently Active (12/13/2019)   Exercise Vital Sign    Days of Exercise per Week: 4 days    Minutes of Exercise per Session: 30 min  Stress: No Stress Concern Present (06/13/2019)   [place]    Feeling of Stress : Only a little  Social Connections: Not on file  Intimate Partner Violence: Not At Risk (08/08/2019)   Humiliation, Afraid, Rape, and Kick questionnaire    Fear of Current or Ex-Partner: No    Emotionally Abused: No    Physically Abused: No    Sexually Abused: No     Constitutional: Denies fever, malaise, fatigue, headache or abrupt weight changes.  Respiratory: Denies difficulty breathing, shortness of breath, cough or sputum production.   Cardiovascular: Patient reports swelling in feet.  Denies chest pain, chest  tightness, palpitations or swelling in the hands.  Musculoskeletal: Denies decrease in range of motion, difficulty with gait, muscle pain or joint pain and swelling.  Skin: Denies redness, rashes, lesions or ulcercations.   No other specific complaints in a complete review of systems (except as listed in HPI above).  Objective:   Physical Exam   BP 104/62 (BP  Location: Left Arm, Patient Position: Sitting, Cuff Size: Normal)   Pulse (!) 115   Temp (!) 97.1 F (36.2 C) (Temporal)   Wt 204 lb (92.5 kg)   SpO2 99%   BMI 31.02 kg/m   Wt Readings from Last 3 Encounters:  04/08/22 202 lb (91.6 kg)  03/09/22 200 lb (90.7 kg)  02/16/22 198 lb (89.8 kg)    General: Appears her stated age, obese, in NAD. Skin: Warm, dry and intact.  Cardiovascular: Tachycardic with normal rhythm. S1,S2 noted.  No murmur, rubs or gallops noted. No JVD. 1 + pitting BLE edema.  Pulmonary/Chest: Normal effort and positive vesicular breath sounds. No respiratory distress. No wheezes, rales or ronchi noted.  Musculoskeletal:  No difficulty with gait.  Neurological: Alert and oriented.   BMET    Component Value Date/Time   NA 141 04/08/2022 1130   NA 141 08/29/2017 0950   K 4.2 04/08/2022 1130   CL 108 04/08/2022 1130   CO2 27 04/08/2022 1130   GLUCOSE 78 04/08/2022 1130   BUN 20 04/08/2022 1130   BUN 19 08/29/2017 0950   CREATININE 1.13 (H) 04/08/2022 1130   CALCIUM 9.6 04/08/2022 1130   GFRNONAA >60 04/07/2020 1022   GFRNONAA 52 (L) 01/02/2020 1453   GFRAA 61 01/02/2020 1453    Lipid Panel     Component Value Date/Time   CHOL 149 04/08/2022 1130   TRIG 92 04/08/2022 1130   HDL 46 (L) 04/08/2022 1130   CHOLHDL 3.2 04/08/2022 1130   VLDL 42.0 (H) 05/14/2014 0918   LDLCALC 84 04/08/2022 1130    CBC    Component Value Date/Time   WBC 5.4 04/08/2022 1130   RBC 3.17 (L) 04/08/2022 1130   HGB 10.3 (L) 04/08/2022 1130   HCT 30.7 (L) 04/08/2022 1130   PLT 134 (L) 04/08/2022 1130   MCV 96.8  04/08/2022 1130   MCH 32.5 04/08/2022 1130   MCHC 33.6 04/08/2022 1130   RDW 11.8 04/08/2022 1130   LYMPHSABS 1,331 10/05/2021 0921   MONOABS 0.4 09/16/2013 1046   EOSABS 61 10/05/2021 0921   BASOSABS 39 10/05/2021 0921    Hgb A1C Lab Results  Component Value Date   HGBA1C 4.9 10/05/2021           Assessment & Plan:   Peripheral Edema:  Encouraged low salt diet Encouraged elevation Encouraged use of compression hose Continue Lisinopril HCT- I do not want to increase given low blood pressure RX for Lasix 20 mg daily x 3 days- I do not want to do this long term due to her CKD   RTC in 5 months for your annual exam [name], NP

## 2022-04-19 ENCOUNTER — Other Ambulatory Visit: Payer: Self-pay | Admitting: Internal Medicine

## 2022-04-19 ENCOUNTER — Encounter: Payer: Self-pay | Admitting: Student in an Organized Health Care Education/Training Program

## 2022-04-19 ENCOUNTER — Ambulatory Visit
Payer: Medicare HMO | Attending: Student in an Organized Health Care Education/Training Program | Admitting: Student in an Organized Health Care Education/Training Program

## 2022-04-19 VITALS — BP 140/90 | HR 99 | Temp 97.1°F | Resp 16 | Ht 68.5 in | Wt 198.0 lb

## 2022-04-19 DIAGNOSIS — M1712 Unilateral primary osteoarthritis, left knee: Secondary | ICD-10-CM | POA: Diagnosis not present

## 2022-04-19 DIAGNOSIS — M170 Bilateral primary osteoarthritis of knee: Secondary | ICD-10-CM | POA: Diagnosis not present

## 2022-04-19 DIAGNOSIS — G8929 Other chronic pain: Secondary | ICD-10-CM | POA: Diagnosis not present

## 2022-04-19 DIAGNOSIS — M5416 Radiculopathy, lumbar region: Secondary | ICD-10-CM

## 2022-04-19 DIAGNOSIS — I5032 Chronic diastolic (congestive) heart failure: Secondary | ICD-10-CM

## 2022-04-19 DIAGNOSIS — M25571 Pain in right ankle and joints of right foot: Secondary | ICD-10-CM | POA: Insufficient documentation

## 2022-04-19 DIAGNOSIS — M722 Plantar fascial fibromatosis: Secondary | ICD-10-CM | POA: Insufficient documentation

## 2022-04-19 DIAGNOSIS — G43709 Chronic migraine without aura, not intractable, without status migrainosus: Secondary | ICD-10-CM | POA: Insufficient documentation

## 2022-04-19 DIAGNOSIS — G894 Chronic pain syndrome: Secondary | ICD-10-CM | POA: Insufficient documentation

## 2022-04-19 DIAGNOSIS — R609 Edema, unspecified: Secondary | ICD-10-CM

## 2022-04-19 MED ORDER — HYDROCODONE-ACETAMINOPHEN 7.5-325 MG PO TABS: 1.0000 | ORAL_TABLET | Freq: Four times a day (QID) | ORAL | 0 refills | Status: AC | PRN

## 2022-04-19 MED ORDER — HYDROCODONE-ACETAMINOPHEN 7.5-325 MG PO TABS: 1.0000 | ORAL_TABLET | Freq: Four times a day (QID) | ORAL | 0 refills | Status: DC | PRN

## 2022-04-19 NOTE — Progress Notes (Signed)
Nursing Pain Medication Assessment:  Safety precautions to be maintained throughout the outpatient stay will include: orient to surroundings, keep bed in low position, maintain call bell within reach at all times, provide assistance with transfer out of bed and ambulation.  Medication Inspection Compliance: Pill count conducted under aseptic conditions, in front of the patient. Neither the pills nor the bottle was removed from the patient's sight at any time. Once count was completed pills were immediately returned to the patient in their original bottle.  Medication: Hydrocodone/APAP Pill/Patch Count:  14 of 105 pills remain Pill/Patch Appearance: Markings consistent with prescribed medication Bottle Appearance: Standard pharmacy container. Clearly labeled. Filled Date: 68 / 20 / 2023 Last Medication intake:  Today

## 2022-04-19 NOTE — Patient Instructions (Signed)
____________________________________________________________________________________________  General Risks and Possible Complications  Patient Responsibilities: It is important that you read this as it is part of your informed consent. It is our duty to inform you of the risks and possible complications associated with treatments offered to you. It is your responsibility as a patient to read this and to ask questions about anything that is not clear or that you believe was not covered in this document.  Patient's Rights: You have the right to refuse treatment. You also have the right to change your mind, even after initially having agreed to have the treatment done. However, under this last option, if you wait until the last second to change your mind, you may be charged for the materials used up to that point.  Introduction: Medicine is not an Chief Strategy Officer. Everything in Medicine, including the lack of treatment(s), carries the potential for danger, harm, or loss (which is by definition: Risk). In Medicine, a complication is a secondary problem, condition, or disease that can aggravate an already existing one. All treatments carry the risk of possible complications. The fact that a side effects or complications occurs, does not imply that the treatment was conducted incorrectly. It must be clearly understood that these can happen even when everything is done following the highest safety standards.  No treatment: You can choose not to proceed with the proposed treatment alternative. The "PRO(s)" would include: avoiding the risk of complications associated with the therapy. The "CON(s)" would include: not getting any of the treatment benefits. These benefits fall under one of three categories: diagnostic; therapeutic; and/or palliative. Diagnostic benefits include: getting information which can ultimately lead to improvement of the disease or symptom(s). Therapeutic benefits are those associated with  the successful treatment of the disease. Finally, palliative benefits are those related to the decrease of the primary symptoms, without necessarily curing the condition (example: decreasing the pain from a flare-up of a chronic condition, such as incurable terminal cancer).  General Risks and Complications: These are associated to most interventional treatments. They can occur alone, or in combination. They fall under one of the following six (6) categories: no benefit or worsening of symptoms; bleeding; infection; nerve damage; allergic reactions; and/or death. No benefits or worsening of symptoms: In Medicine there are no guarantees, only probabilities. No healthcare provider can ever guarantee that a medical treatment will work, they can only state the probability that it may. Furthermore, there is always the possibility that the condition may worsen, either directly, or indirectly, as a consequence of the treatment. Bleeding: This is more common if the patient is taking a blood thinner, either prescription or over the counter (example: Goody Powders, Fish oil, Aspirin, Garlic, etc.), or if suffering a condition associated with impaired coagulation (example: Hemophilia, cirrhosis of the liver, low platelet counts, etc.). However, even if you do not have one on these, it can still happen. If you have any of these conditions, or take one of these drugs, make sure to notify your treating physician. Infection: This is more common in patients with a compromised immune system, either due to disease (example: diabetes, cancer, human immunodeficiency virus [HIV], etc.), or due to medications or treatments (example: therapies used to treat cancer and rheumatological diseases). However, even if you do not have one on these, it can still happen. If you have any of these conditions, or take one of these drugs, make sure to notify your treating physician. Nerve Damage: This is more common when the treatment is an  invasive one, but it can also happen with the use of medications, such as those used in the treatment of cancer. The damage can occur to small secondary nerves, or to large primary ones, such as those in the spinal cord and brain. This damage may be temporary or permanent and it may lead to impairments that can range from temporary numbness to permanent paralysis and/or brain death. Allergic Reactions: Any time a substance or material comes in contact with our body, there is the possibility of an allergic reaction. These can range from a mild skin rash (contact dermatitis) to a severe systemic reaction (anaphylactic reaction), which can result in death. Death: In general, any medical intervention can result in death, most of the time due to an unforeseen complication. ____________________________________________________________________________________________

## 2022-04-19 NOTE — Progress Notes (Signed)
PROVIDER NOTE: Information contained herein reflects review and annotations entered in association with encounter. Interpretation of such information and data should be left to medically-trained personnel. Information provided to patient can be located elsewhere in the medical record under "Patient Instructions". Document created using STT-dictation technology, any transcriptional errors that may result from process are unintentional.    Patient: Pamela Romero  Service Category: E/M  Provider: Gillis Santa, MD  DOB: 18-Feb-1951  DOS: 04/19/2022  Specialty: Interventional Pain Management  MRN: [id number]  Setting: Ambulatory outpatient  PCP: Pamela Fenton, NP  Type: Established Patient    Referring Provider: Jearld Fenton, NP  Location: Office  Delivery: Face-to-face     HPI  [name], a 72 y.o. year old female, is here today because of her Bilateral primary osteoarthritis of knee [M17.0]. [name] primary complain today is Knee Pain (Bilateral ) and Foot Pain (Left is worse.  Pitting edema noted )  Last encounter: My last encounter with her was on 02/16/22 Pertinent problems: [name] has Bilateral primary osteoarthritis of knee and Osteoporosis on their pertinent problem list. Pain Assessment: Severity of Chronic pain is reported as a 8 /10. Location: Knee Left, Right/denies. Onset: More than a month ago. Quality: Discomfort, Constant, Aching, Throbbing, Stabbing. Timing: Constant. Modifying factor(s): procedure helped.  medications. Vitals:  height is 5' 8.5" (1.74 m) and weight is 198 lb (89.8 kg). Her temporal temperature is 97.1 F (36.2 C) (abnormal). Her blood pressure is 140/90 (abnormal) and her pulse is 99. Her respiration is 16 and oxygen saturation is 100%.   Reason for encounter:  MM and worsening  bilateral knee pain  Refill of hydrocodone as below.  No change in dose.  Discussed weaning to every 8 hours in spring.  Patient states that she is utilizing every  6-8 hours as needed right now as her knee pain does flareup in the winter months.  Patient requesting repeat long-acting Kenalog injection for both knees.  She had long-acting Kenalog previously done in 02/16/22  that provided her to her with 75%  pain relief for about 8 weeks and now she is having increased pain especially with weightbearing for the left knee.   Plan to repeat long acting Kenalog after 05/19/21  which will be 3 months after her previous knee injection  She has severe L>R pitting edema of bilateral feet that is extending to her shins. She is also complaining of left calf tenderness.  Discussed treatment plan with Pamela Romero: repeating ECHO, LE doppler studies. Appreciate assistance.    Pharmacotherapy Assessment  Analgesic: Hydrocodone 7.5 mg 3 times daily as needed with an extra quantity 15/month (#105)For severe breakthrough pain    Monitoring: [place] PMP: PDMP reviewed during this encounter.       Pharmacotherapy: No side-effects or adverse reactions reported. Compliance: No problems identified. Effectiveness: Clinically acceptable.  UDS:  Summary  Date Value Ref Range Status  11/02/2021 Note  Final    Comment:    ==================================================================== [employer] [facility] (MW) ==================================================================== Test                             Result       Flag       Units  Drug Present and Declared for Prescription Verification   Hydrocodone                    1063  EXPECTED   ng/mg creat   Hydromorphone                  119          EXPECTED   ng/mg creat   Dihydrocodeine                 191          EXPECTED   ng/mg creat   Norhydrocodone                 2059         EXPECTED   ng/mg creat    Sources of hydrocodone include scheduled prescription medications.    Hydromorphone, dihydrocodeine and norhydrocodone are expected    metabolites of hydrocodone. Hydromorphone and dihydrocodeine are     also available as scheduled prescription medications.  ==================================================================== Test                      Result    Flag   Units      Ref Range   Creatinine              172              mg/dL      >=20 ==================================================================== Declared Medications:  The flagging and interpretation on this report are based on the  following declared medications.  Unexpected results may arise from  inaccuracies in the declared medications.   **Note: The testing scope of this panel includes these medications:   Hydrocodone (Norco)   **Note: The testing scope of this panel does not include the  following reported medications:   Acetaminophen (Norco)  Albuterol (Ventolin HFA)  Aspirin  Atorvastatin (Lipitor)  Esomeprazole (Nexium)  Gabapentin (Neurontin)  Hydrochlorothiazide (Zestoretic)  Levothyroxine (Synthroid)  Lisinopril (Zestoretic)  Ondansetron (Zofran)  Rizatriptan (Maxalt)  Sertraline (Zoloft)  Vitamin B12 ==================================================================== For clinical consultation, please call [phone]. ====================================================================       ROS  Constitutional: Denies any fever or chills Gastrointestinal: No reported hemesis, hematochezia, vomiting, or acute GI distress Musculoskeletal:  Left knee pain right knee pain as well Neurological: No reported episodes of acute onset apraxia, aphasia, dysarthria, agnosia, amnesia, paralysis, loss of coordination, or loss of consciousness  Medication Review  HYDROcodone-acetaminophen, albuterol, aspirin EC, atorvastatin, docusate sodium, esomeprazole, famotidine, furosemide, gabapentin, levothyroxine, lisinopril-hydrochlorothiazide, ondansetron, polyethylene glycol powder, rizatriptan, and sertraline  History Review  Allergy: [name] has No Known Allergies. Drug: [name]  reports no  history of drug use. Alcohol:  reports no history of alcohol use. Tobacco:  reports that she quit smoking about 3 years ago. Her smoking use included cigarettes. She has a 25.00 pack-year smoking history. She has quit using smokeless tobacco. Social: Ms. Lemmons  reports that she quit smoking about 3 years ago. Her smoking use included cigarettes. She has a 25.00 pack-year smoking history. She has quit using smokeless tobacco. She reports that she does not drink alcohol and does not use drugs. Medical:  has a past medical history of Abnormal EKG, CAD (coronary artery disease), Cancer ([place]), COPD (chronic obstructive pulmonary disease) ([place]), Endometrial adenocarcinoma ([place]) (2011), Hyperlipidemia, Hypertension, Osteoarthritis, Reflux gastritis, Thyroid disease, tobacco abuse, and Vaginal delivery. Surgical: Ms. Freeman  has a past surgical history that includes Thyroidectomy (2005); Cardiac catheterization (2006); Total abdominal hysterectomy; Breast surgery (Right, March 2014); Breast cyst aspiration (Right, 2014); and Esophagogastroduodenoscopy (N/A, 04/07/2020). Family: family history includes Alcohol abuse in her father, maternal aunt, and mother;  Cancer in her brother, brother, brother, and maternal aunt; Heart disease in her brother.  Laboratory Chemistry Profile   Renal Lab Results  Component Value Date   BUN 20 04/08/2022   CREATININE 1.13 (H) 04/08/2022   BCR 18 04/08/2022   GFR 60.34 01/21/2015   GFRAA 61 01/02/2020   GFRNONAA >60 04/07/2020    Hepatic Lab Results  Component Value Date   AST 10 04/08/2022   ALT 7 04/08/2022   ALBUMIN 4.4 08/29/2017   ALKPHOS 126 (H) 08/29/2017   HCVAB NEGATIVE 01/21/2015    Electrolytes Lab Results  Component Value Date   NA 141 04/08/2022   K 4.2 04/08/2022   CL 108 04/08/2022   CALCIUM 9.6 04/08/2022   MG 1.3 (L) 09/15/2021    Bone Lab Results  Component Value Date   VD25OH 18 (L) 12/09/2021    Inflammation (CRP: Acute Phase)  (ESR: Chronic Phase) Lab Results  Component Value Date   ESRSEDRATE 19 03/30/2012         Note: Above Lab results reviewed.   Physical Exam  General appearance: Well nourished, well developed, and well hydrated. In no apparent acute distress Mental status: Alert, oriented x 3 (person, place, & time)       Respiratory: No evidence of acute respiratory distress Eyes: PERLA Vitals: BP (!) 140/90 (BP Location: Right Arm, Patient Position: Sitting, Cuff Size: Normal)   Pulse 99   Temp (!) 97.1 F (36.2 C) (Temporal)   Resp 16   Ht 5' 8.5" (1.74 m)   Wt 198 lb (89.8 kg)   SpO2 100%   BMI 29.67 kg/m  BMI: Estimated body mass index is 29.67 kg/m as calculated from the following:   Height as of this encounter: 5' 8.5" (1.74 m).   Weight as of this encounter: 198 lb (89.8 kg). Ideal: Ideal body weight: 65 kg (143 lb 6.5 oz) Adjusted ideal body weight: 75 kg (165 lb 3.9 oz)  Limited range of motion of left knee, pain with weightbearing Arthropathic pain pattern of left knee pain right knee  Assessment   Status Diagnosis  Having a Flare-up Having a Flare-up Persistent 1. Bilateral primary osteoarthritis of knee   2. Primary osteoarthritis of left knee   3. Chronic radicular lumbar pain (left)   4. Plantar fascia syndrome   5. Chronic pain of right ankle   6. Chronic migraine without aura without status migrainosus, not intractable   7. Chronic pain syndrome           Plan of Care   Requested Prescriptions   Signed Prescriptions Disp Refills   HYDROcodone-acetaminophen (NORCO) 7.5-325 MG tablet 105 tablet 0    Sig: Take 1 tablet by mouth every 6 (six) hours as needed for severe pain. Must last 30 days.   HYDROcodone-acetaminophen (NORCO) 7.5-325 MG tablet 105 tablet 0    Sig: Take 1 tablet by mouth every 6 (six) hours as needed for severe pain. Must last 30 days.   HYDROcodone-acetaminophen (NORCO) 7.5-325 MG tablet 105 tablet 0    Sig: Take 1 tablet by mouth every  6 (six) hours as needed for severe pain. Must last 30 days.  Continue with gabapentin as prescribed, no refills needed.  Orders Placed This Encounter  Procedures   KNEE INJECTION    Local Anesthetic & Steroid injection.    Standing Status:   Future    Standing Expiration Date:   07/19/2022    Scheduling Instructions:     Side: Bilateral  Sedation: None     Timeframe: As soon as schedule allows    Order Specific Question:   Where will this procedure be performed?    Answer:   [facility]   Follow-up in February for bilateral extended release Kenalog injections. Follow-up with echo and lower extremity Doppler studies for workup of left greater than right pitting edema   Follow-up plan:   Return in about 3 months (around 07/19/2022) for Medication Management, in person.     Status post left genicular RFA 04/16/2018, status post right genicular RFA 03/12/2018.  Provided her with benefit.  Status post left knee intra-articular steroid injection #3 on 11/21/2018.   hyalgan left knee #1 on 04/17/19, #2 05/22/19, #3 07/03/2019, #4 08/28/19, #5 on 09/25/19             Recent Visits Date Type Provider Dept  02/16/22 Procedure visit Pamela Santa, MD [facility]  01/25/22 Office Visit Pamela Santa, MD [facility]  Showing recent visits within past 90 days and meeting all other requirements Today's Visits Date Type Provider Dept  04/19/22 Office Visit Pamela Santa, MD [facility]  Showing today's visits and meeting all other requirements Future Appointments Date Type Provider Dept  05/30/22 Appointment Pamela Santa, MD [facility]  07/14/22 Appointment Pamela Santa, MD [facility]  Showing future appointments within next 90 days and meeting all other requirements  I discussed the assessment and treatment plan with the patient. The patient was provided an opportunity to ask questions and all were answered. The patient agreed with the plan  and demonstrated an understanding of the instructions.  Patient advised to call back or seek an in-person evaluation if the symptoms or condition worsens.  Duration of encounter: 32mnutes.  Note by: BGillis Santa MD Date: 04/19/2022; Time: 11:34 AM

## 2022-04-28 ENCOUNTER — Other Ambulatory Visit: Payer: Self-pay | Admitting: Student in an Organized Health Care Education/Training Program

## 2022-04-28 ENCOUNTER — Ambulatory Visit (HOSPITAL_COMMUNITY): Payer: Medicare HMO

## 2022-04-28 DIAGNOSIS — G8929 Other chronic pain: Secondary | ICD-10-CM

## 2022-04-28 DIAGNOSIS — G894 Chronic pain syndrome: Secondary | ICD-10-CM

## 2022-04-29 ENCOUNTER — Ambulatory Visit (INDEPENDENT_AMBULATORY_CARE_PROVIDER_SITE_OTHER): Payer: Medicare HMO

## 2022-04-29 VITALS — Ht 68.5 in | Wt 198.0 lb

## 2022-04-29 DIAGNOSIS — Z122 Encounter for screening for malignant neoplasm of respiratory organs: Secondary | ICD-10-CM

## 2022-04-29 DIAGNOSIS — Z780 Asymptomatic menopausal state: Secondary | ICD-10-CM

## 2022-04-29 DIAGNOSIS — Z0000 Encounter for general adult medical examination without abnormal findings: Secondary | ICD-10-CM | POA: Diagnosis not present

## 2022-04-29 NOTE — Progress Notes (Signed)
Virtual Visit via Telephone Note  I connected with  Pamela Romero on 04/29/22 at  9:00 AM EST by telephone and verified that I am speaking with the correct person using two identifiers.  Location: Patient: home Provider: [facility] Persons participating in the virtual visit: [place]   I discussed the limitations, risks, security and privacy concerns of performing an evaluation and management service by telephone and the availability of in person appointments. The patient expressed understanding and agreed to proceed.  Interactive audio and video telecommunications were attempted between this nurse and patient, however failed, due to patient having technical difficulties OR patient did not have access to video capability.  We continued and completed visit with audio only.  Some vital signs may be absent or patient reported.   Dionisio David, LPN  Subjective:   Pamela Romero is a 72 y.o. female who presents for Medicare Annual (Subsequent) preventive examination.  Review of Systems     Cardiac Risk Factors include: advanced age (>29mn, >>22women);dyslipidemia;hypertension     Objective:    Today's Vitals   04/29/22 0858  PainSc: 4    There is no height or weight on file to calculate BMI.     04/29/2022    9:05 AM 02/16/2022   10:34 AM 01/25/2022   10:41 AM 02/09/2021    9:50 AM 11/09/2020   10:34 AM 08/26/2020   10:04 AM 08/04/2020    8:43 AM  Advanced Directives  Does Patient Have a Medical Advance Directive? No No No No No No No  Would patient like information on creating a medical advance directive? No - Patient declined No - Patient declined No - Patient declined No - Patient declined No - Patient declined No - Patient declined     Current Medications (verified) Outpatient Encounter Medications as of 04/29/2022  Medication Sig   albuterol (PROAIR HFA) 108 (90 Base) MCG/ACT inhaler Inhale 1-2 puffs into the lungs every 6 (six) hours as needed for  wheezing or shortness of breath.   aspirin EC 81 MG tablet Take 1 tablet (81 mg total) by mouth daily. Swallow whole.   atorvastatin (LIPITOR) 10 MG tablet Take 1 tablet (10 mg total) by mouth daily.   docusate sodium (COLACE) 100 MG capsule Take 1 capsule (100 mg total) by mouth at bedtime as needed for mild constipation.   esomeprazole (NEXIUM) 40 MG capsule Take 1 capsule (40 mg total) by mouth 2 (two) times daily before a meal.   famotidine (PEPCID) 20 MG tablet Take 1 tablet (20 mg total) by mouth at bedtime.   gabapentin (NEURONTIN) 300 MG capsule Take 1-2 capsules (300-600 mg total) by mouth at bedtime.   [START ON 05/19/2022] HYDROcodone-acetaminophen (NORCO) 7.5-325 MG tablet Take 1 tablet by mouth every 6 (six) hours as needed for severe pain. Must last 30 days.   levothyroxine (SYNTHROID) 125 MCG tablet Take 1 tablet (125 mcg total) by mouth daily.   lisinopril-hydrochlorothiazide (ZESTORETIC) 20-12.5 MG tablet Take 1 tablet by mouth daily.   ondansetron (ZOFRAN-ODT) 4 MG disintegrating tablet Take 1 tablet (4 mg total) by mouth daily as needed for nausea or vomiting.   rizatriptan (MAXALT-MLT) 10 MG disintegrating tablet Take 1 tablet (10 mg total) by mouth as needed for migraine. May repeat in 2 hours if needed   sertraline (ZOLOFT) 50 MG tablet Take 1/2 tab PO x 10 days then increase to 1 tab daily thereafter   furosemide (LASIX) 20 MG tablet Take 1 tablet (20  mg total) by mouth daily.   HYDROcodone-acetaminophen (NORCO) 7.5-325 MG tablet Take 1 tablet by mouth every 6 (six) hours as needed for severe pain. Must last 30 days.   polyethylene glycol powder (GLYCOLAX/MIRALAX) 17 GM/SCOOP powder Take 17 g by mouth daily. (Patient not taking: Reported on 04/29/2022)   [DISCONTINUED] HYDROcodone-acetaminophen (NORCO) 7.5-325 MG tablet Take 1 tablet by mouth every 6 (six) hours as needed for severe pain. Must last 30 days.   No facility-administered encounter medications on file as of  04/29/2022.    Allergies (verified) Patient has no known allergies.   History: Past Medical History:  Diagnosis Date   Abnormal EKG    CAD (coronary artery disease)    non critical, by cardiac cath 2006   Cancer [facility])    endometrial Ca   COPD (chronic obstructive pulmonary disease) ([place])    Endometrial adenocarcinoma ([place]) 2011   Hyperlipidemia    Hypertension    Osteoarthritis    Right foot, Left knee (Dr. Roland Rack)   Reflux gastritis    Thyroid disease    tobacco abuse    Vaginal delivery    x 2   Past Surgical History:  Procedure Laterality Date   BREAST CYST ASPIRATION Right 2014   neg   BREAST SURGERY Right March 2014   benign [place]   CARDIAC CATHETERIZATION  2006   40% LAD, EF 60%   ESOPHAGOGASTRODUODENOSCOPY N/A 04/07/2020   Procedure: ESOPHAGOGASTRODUODENOSCOPY (EGD);  Surgeon: Jonathon Bellows, MD;  Location: [facility] ENDOSCOPY;  Service: Gastroenterology;  Laterality: N/A;   THYROIDECTOMY  2005   partial at [facility], non malignant tumor   TOTAL ABDOMINAL HYSTERECTOMY     with BSO   Family History  Problem Relation Age of Onset   Alcohol abuse Father    Cancer Maternal Aunt        stomach   Alcohol abuse Maternal Aunt    Alcohol abuse Mother    Heart disease Brother        MI age 71- non smoker   Cancer Brother        Pancreatic   Cancer Brother        colon, dx'd age 23   Cancer Brother        stomach, paternal half brother   Breast cancer Neg Hx    Social History   Socioeconomic History   Marital status: Married    Spouse name: Not on file   Number of children: 0   Years of education: Not on file   Highest education level: Not on file  Occupational History   Occupation: retired   Tobacco Use   Smoking status: Former    Packs/day: 0.50    Years: 50.00    Total pack years: 25.00    Types: Cigarettes    Quit date: 05/11/2018    Years since quitting: 3.9   Smokeless tobacco: Former  Scientific laboratory technician Use: Never used  Substance and Sexual Activity    Alcohol use: No   Drug use: No   Sexual activity: Not on file  Other Topics Concern   Not on file  Social History Narrative   Lives with spouse, takes care of her mother.   Had 2 children, one died at birth, the other given up for adoption (was unmarried).   Social Determinants of Health   Financial Resource Strain: Low Risk  (04/29/2022)   Overall Financial Resource Strain (CARDIA)    Difficulty of Paying Living Expenses: Not very hard  Food Insecurity: No Food Insecurity (04/29/2022)   Hunger Vital Sign    Worried About Running Out of Food in the Last Year: Never true    Ran Out of Food in the Last Year: Never true  Transportation Needs: No Transportation Needs (04/29/2022)   PRAPARE - Hydrologist (Medical): No    Lack of Transportation (Non-Medical): No  Physical Activity: Insufficiently Active (04/29/2022)   Exercise Vital Sign    Days of Exercise per Week: 3 days    Minutes of Exercise per Session: 30 min  Stress: No Stress Concern Present (04/29/2022)   [place]    Feeling of Stress : Only a little  Social Connections: Moderately Integrated (04/29/2022)   Social Connection and Isolation Panel [NHANES]    Frequency of Communication with Friends and Family: Three times a week    Frequency of Social Gatherings with Friends and Family: Twice a week    Attends Religious Services: More than 4 times per year    Active Member of [employer] or Organizations: No    Attends Music therapist: Never    Marital Status: Married    Tobacco Counseling Counseling given: Not Answered   Clinical Intake:  Pre-visit preparation completed: Yes  Pain : 0-10 Pain Score: 4  Pain Type: Chronic pain Pain Location: Foot     Nutritional Risks: None Diabetes: No  How often do you need to have someone help you when you read instructions, pamphlets, or other written materials from your  doctor or pharmacy?: 1 - Never  Diabetic?no  Interpreter Needed?: No  Information entered by :: Kirke Shaggy, LPN   Activities of Daily Living    04/29/2022    9:06 AM 04/08/2022   11:06 AM  In your present state of health, do you have any difficulty performing the following activities:  Hearing? 0 1  Vision? 0 0  Difficulty concentrating or making decisions? 0 0  Walking or climbing stairs? 0 0  Dressing or bathing? 0 0  Doing errands, shopping? 0 0  Preparing Food and eating ? N   Using the Toilet? N   In the past six months, have you accidently leaked urine? N   Do you have problems with loss of bowel control? Y   Managing your Medications? N   Managing your Finances? N   Housekeeping or managing your Housekeeping? N     Patient Care Team: Jearld Fenton, NP as PCP - General (Internal Medicine) Greg Cutter, LCSW as Social Worker (Licensed Clinical Social Worker)  Indicate any recent [employer] you may have received from other than [facility] providers in the past year (date may be approximate).     Assessment:   This is a routine wellness examination for Pamela Romero.  Hearing/Vision screen Hearing Screening - Comments:: No aids Vision Screening - Comments:: Wears glasses- [facility]  Dietary issues and exercise activities discussed: Current Exercise Habits: Home exercise routine, Type of exercise: walking, Time (Minutes): 30, Frequency (Times/Week): 3, Weekly Exercise (Minutes/Week): 90, Intensity: Mild   Goals Addressed             This Visit's Progress    DIET - EAT MORE FRUITS AND VEGETABLES         Depression Screen    04/29/2022    9:02 AM 04/08/2022   10:59 AM 03/09/2022   10:47 AM 02/16/2022   10:34 AM 01/25/2022   10:40 AM 08/06/2021  2:35 PM 03/16/2021    8:31 AM  PHQ 2/9 Scores  PHQ - 2 Score '1 2 1 '$ 0 0 4 1  PHQ- 9 Score  '7 8   17 13    '$ Fall Risk    04/29/2022    9:06 AM 04/19/2022   10:44 AM 04/08/2022   11:05 AM 03/09/2022    10:48 AM 02/16/2022   10:33 AM  Fall Risk   Falls in the past year? 0 0 0 0 0  Number falls in past yr: 0 0 0    Injury with Fall? 0  0 0   Risk for fall due to : No Fall Risks No Fall Risks No Fall Risks    Follow up Falls prevention discussed;Falls evaluation completed Falls evaluation completed Falls evaluation completed      FALL RISK PREVENTION PERTAINING TO THE HOME:  Any stairs in or around the home? Yes  If so, are there any without handrails? No  Home free of loose throw rugs in walkways, pet beds, electrical cords, etc? Yes  Adequate lighting in your home to reduce risk of falls? Yes   ASSISTIVE DEVICES UTILIZED TO PREVENT FALLS:  Life alert? No  Use of a cane, walker or w/c? No  Grab bars in the bathroom? No  Shower chair or bench in shower? No  Elevated toilet seat or a handicapped toilet? No     Cognitive Function:    08/30/2019   11:02 AM  MMSE - Mini Mental State Exam  Orientation to time 4  Orientation to Place 5  Registration 3  Attention/ Calculation 5  Recall 2  Language- name 2 objects 2  Language- repeat 1  Language- follow 3 step command 3  Language- read & follow direction 1  Write a sentence 1  Copy design 1  Total score 28        04/29/2022    9:20 AM 06/10/2019    2:24 PM  6CIT Screen  What Year? 0 points 0 points  What month? 0 points 0 points  What time? 0 points 0 points  Count back from 20 0 points 0 points  Months in reverse 0 points 0 points  Repeat phrase 2 points 2 points  Total Score 2 points 2 points    Immunizations Immunization History  Administered Date(s) Administered   Fluad Quad(high Dose 65+) 01/11/2019, 03/16/2021, 04/08/2022   Influenza Whole 01/24/2012   Influenza,inj,Quad PF,6+ Mos 05/14/2014, 01/21/2015   PFIZER(Purple Top)SARS-COV-2 Vaccination 06/13/2019, 07/08/2019   PNEUMOCOCCAL CONJUGATE-20 10/05/2021   PPD Test 06/30/2020   Pneumococcal Conjugate-13 01/21/2015   Pneumococcal Polysaccharide-23  02/07/2012, 09/24/2019   Tdap 02/07/2012    TDAP status: Due, Education has been provided regarding the importance of this vaccine. Advised may receive this vaccine at local pharmacy or Health Dept. Aware to provide a copy of the vaccination record if obtained from local pharmacy or Health Dept. Verbalized acceptance and understanding.  Flu Vaccine status: Up to date  Pneumococcal vaccine status: Up to date  Covid-19 vaccine status: Completed vaccines  Qualifies for Shingles Vaccine? Yes   Zostavax completed No   Shingrix Completed?: No.    Education has been provided regarding the importance of this vaccine. Patient has been advised to call insurance company to determine out of pocket expense if they have not yet received this vaccine. Advised may also receive vaccine at local pharmacy or Health Dept. Verbalized acceptance and understanding.  Screening Tests Health Maintenance  Topic Date Due  COLONOSCOPY (Pts 45-69yr Insurance coverage will need to be confirmed)  Never done   Lung Cancer Screening  Never done   Zoster Vaccines- Shingrix (1 of 2) Never done   MAMMOGRAM  11/18/2021   COVID-19 Vaccine (3 - 2023-24 season) 12/10/2021   DTaP/Tdap/Td (2 - Td or Tdap) 02/06/2022   Medicare Annual Wellness (AWV)  04/30/2023   Pneumonia Vaccine 70 Years old  Completed   INFLUENZA VACCINE  Completed   DEXA SCAN  Completed   Hepatitis C Screening  Completed   HPV VACCINES  Aged Out    Health Maintenance  Health Maintenance Due  Topic Date Due   COLONOSCOPY (Pts 45-433yrInsurance coverage will need to be confirmed)  Never done   Lung Cancer Screening  Never done   Zoster Vaccines- Shingrix (1 of 2) Never done   MAMMOGRAM  11/18/2021   COVID-19 Vaccine (3 - 2023-24 season) 12/10/2021   DTaP/Tdap/Td (2 - Td or Tdap) 02/06/2022    Declined referral for colonoscopy  Mammogram status: Ordered 10/05/21. Pt provided with contact info and advised to call to schedule appt. - will make  appointment  Bone Density status: Completed 11/19/19. Results reflect: Bone density results: OSTEOPOROSIS. Repeat every 2 years.-referral sent  Lung Cancer Screening: (Low Dose CT Chest recommended if Age 70-80ears, 30 pack-year currently smoking OR have quit w/in 15years.) does not qualify.   Lung Cancer Screening Referral: ordered today  Additional Screening:  Hepatitis C Screening: does qualify; Completed 01/21/15  Vision Screening: Recommended annual ophthalmology exams for early detection of glaucoma and other disorders of the eye. Is the patient up to date with their annual eye exam?  Yes  Who is the provider or what is the name of the office in which the patient attends annual eye exams? [facility] is not established with a provider, would they like to be referred to a provider to establish care? No .   Dental Screening: Recommended annual dental exams for proper oral hygiene  Community Resource Referral / Chronic Care Management: CRR required this visit?  No   CCM required this visit?  No      Plan:     I have personally reviewed and noted the following in the patient's chart:   Medical and social history Use of alcohol, tobacco or illicit drugs  Current medications and supplements including opioid prescriptions. Patient is not currently taking opioid prescriptions. Functional ability and status Nutritional status Physical activity Advanced directives List of other physicians Hospitalizations, surgeries, and ER visits in previous 12 months Vitals Screenings to include cognitive, depression, and falls Referrals and appointments  In addition, I have reviewed and discussed with patient certain preventive protocols, quality metrics, and best practice recommendations. A written personalized care plan for preventive services as well as general preventive health recommendations were provided to patient.     LoDionisio DavidLPN   04/15/25/7824 Nurse Notes:  none

## 2022-04-29 NOTE — Patient Instructions (Signed)
[name] , Thank you for taking time to come for your Medicare Wellness Visit. I appreciate your ongoing commitment to your health goals. Please review the following plan we discussed and let me know if I can assist you in the future.   These are the goals we discussed:  Goals       DIET - EAT MORE FRUITS AND VEGETABLES      Exercise 3x per week (30 min per time)      RNCM: PT- "I have not been taking my medication like I should" I need to do better.      CARE PLAN ENTRY (see longtitudinal plan of care for additional care plan information)  Current Barriers:  Chronic Disease Management support, education, and care coordination needs related to HTN, COPD, and Hypothyroidism  Financial Barriers   Clinical Goal(s) related to HTN, COPD, and Hypothyroidism :  Over the next 120 days, patient will:  Work with the care management team to address educational, disease management, and care coordination needs  Begin or continue self health monitoring activities as directed today Measure and record blood pressure 5 times per week and adhere to a Heart Healthy Diet Call provider office for new or worsened signs and symptoms Blood pressure findings outside established parameters and New or worsened symptom related to COPD or Hypothyroidism  Call care management team with questions or concerns Verbalize basic understanding of patient centered plan of care established today  Interventions related to HTN, COPD, and Hypothyroidism  :  Evaluation of current treatment plans and patient's adherence to plan as established by provider: Pt saw pcp recently and was evaluated for concerns. The patient has not always been taking her medications as prescribed. The patient is committed to changing her habits and getting on track with her health and well being.  The patient endorses doing better with taking her medications and also is happy to report her blood pressure is better. Still on the high side but it is  coming down with the new medication regimen. The patient is experiencing dizziness at times but it is better. Education and support given. Her specialist is working on adjusting her thyroid medication as this has been a current and on going problem for the patient.  03-09-2020: The patient is seeing a specialist and they are helping with managing her Hypothyroidism.  She has upcoming appointment on 04-06-2020 for follow up. She also says her COPD is sometimes worse than other times but she is doing what she can to monitor for exacerbations. She is pacing her activity and being safe. Her son and family have [place] but she has not been with them. She is wanting to get the booster. Education and support given. 04-07-2020: Incoming call from the patient.  The patient states last night she got something stuck in her throat and she can not get it to go down. The patient states she cannot swallow and tried to call the office but could not wait on the line any longer.  Advised the patient she may need to go to the ER or urgent care. Advised that this was not the proper outlet to contact provider but the RNCM would send a message to the provider about expressed concern. Instant message to the provider and office staff asking for recommendations. Message received back that the patient is instructed to go to the ER and the CMA was currently on the phone with the patient advising the patient of the course of action to take. Will  continue to follow.  Assessed patient understanding of disease states- education and support Assessed patient's education and care coordination needs- the patient is receptive to the CCM team working with her to help manage her health and well being.   The patient also wants recommendations from the pcp about a "coopertone belt" she has purchased for her back. She ordered the wrong size and is having to send that one back and get another one. She is wanting something that will help with her back  because it is weak where she has not been exercising like she should.  Will collaborate with pcp and ask for recommendations. 03-09-2020: The patient states she is still dealing with back pain but she is doing better. She feels when everything is back on track she will feel a lot better. She is seeing a pain specialist for her back pain and discomfort.  Provided disease specific education to patient - education on taking medications as prescribed, keeping a record of blood pressure readings, adherence to a Heart Healthy diet Assessed the patient mental health. The patient is so tired of being in her home but is excited about an upcoming trip to [place] to surprise her aunt. She feels like it will be good for her to get out of her house and [place]. 03-09-2020: The patient states that she is getting more involved with her church and they are helping her with socialization.  She wants to be a part of a social system that helps her with her social needs. Encouraged the patient to utilize her support systems and the CCM team for help and support. She feels she is doing well with balancing her health and well being. Does want to stay in control of her health and changes going on with her health.  Collaborated with appropriate clinical care team members regarding patient needs: Pharmacy referral for help with medication  to help with chronic constipation. and LCSW for help with stress and anxiety  Patient Self Care Activities related to HTN, COPD, and Hypothyroidism  :  Patient is unable to independently self-manage chronic health conditions  Please see past updates related to this goal by clicking on the "Past Updates" button in the selected goal        RNCM: pt-"I am having a time with constipation" (pt-stated)      CARE PLAN ENTRY (see longitudinal plan of care for additional care plan information)  Current Barriers:  Knowledge Deficits related to chronic constipation after cancer surgery several years  ago Care Coordination needs related to worsening constipation in a patient with Chronic constipation (disease states) Chronic Disease Management support and education needs related to chronic constipation after cancer surgery   Nurse Case Manager Clinical Goal(s):  Over the next 120 days, patient will verbalize understanding of plan for managing chronic constipation  Over the next 120 days, patient will work with [facility] and pcp to address needs related to chronic constipation Over the next 120 days, patient will demonstrate a decrease in constipation exacerbations as evidenced by finding a bowel habit ritual that works well with her system Over the next 120 days, patient will attend all scheduled medical appointments: saw pcp on 01-02-2020 and upcoming appointment with specialist on 04-06-2020 Over the next 120 days, the patient will demonstrate ongoing self health care management ability as evidenced by managed constipation  Interventions:  Inter-disciplinary care team collaboration (see longitudinal plan of care) Advised patient to talk to the pcp about her chronic constipation and recommendations Provided education  to patient re: alternative means for relief of constipation. The patient was not having an issue until after her cancer surgery. The powder does not work as well. The patient states that she feels the constipation is getting worse. 03-09-2020: The patient is following the recommendations of the pcp. Discussed dietary changes and habits. The patient is eating a lot of fruits and vegetables but still having issues with constipation. Discussed drinking prune juice and prunes. Also discussed having a cologuard test or colonoscopy due to history of cancer, her brother having colon cancer, and constipation. Discussed collaguard testing and follow up with pcp.  Collaborated with pcp regarding the patients chronic constipation issues. 03-09-2020: The patient agrees to get an appointment with the  pcp for follow up on constipation and possible testing for colon issues.  Will send an in basket message to the [facility] front staff to get an appointment for patient with pcp.  Discussed plans with patient for ongoing care management follow up and provided patient with direct contact information for care management team Provided patient with alternatives for healthy bowel habits educational materials related to chronic constipation Reviewed scheduled/upcoming provider appointments including: The patient has no new appointments with the pcp. Will call when needed. Is waiting for specialist to call to follow up.   Patient Self Care Activities:  Patient verbalizes understanding of plan to work with [facility], CCM team and pcp to help with chronic constipation needs Self administers medications as prescribed Attends all scheduled provider appointments Calls provider office for new concerns or questions Unable to independently manage chronic constipation  Please see past updates related to this goal by clicking on the "Past Updates" button in the selected goal          This is a list of the screening recommended for you and due dates:  Health Maintenance  Topic Date Due   Colon Cancer Screening  Never done   Screening for Lung Cancer  Never done   Zoster (Shingles) Vaccine (1 of 2) Never done   Mammogram  11/18/2021   COVID-19 Vaccine (3 - 2023-24 season) 12/10/2021   DTaP/Tdap/Td vaccine (2 - Td or Tdap) 02/06/2022   Medicare Annual Wellness Visit  04/30/2023   Pneumonia Vaccine  Completed   Flu Shot  Completed   DEXA scan (bone density measurement)  Completed   Hepatitis C Screening: USPSTF Recommendation to screen - Ages 39-79 yo.  Completed   HPV Vaccine  Aged Out    Advanced directives: no  Conditions/risks identified: none  Next appointment: Follow up in one year for your annual wellness visit 05/05/23 @ 8:45 am by phone   Preventive Care 65 Years and Older, Female Preventive care  refers to lifestyle choices and visits with your health care provider that can promote health and wellness. What does preventive care include? A yearly physical exam. This is also called an annual well check. Dental exams once or twice a year. Routine eye exams. Ask your health care provider how often you should have your eyes checked. Personal lifestyle choices, including: Daily care of your teeth and gums. Regular physical activity. Eating a healthy diet. Avoiding tobacco and drug use. Limiting alcohol use. Practicing safe sex. Taking low-dose aspirin every day. Taking vitamin and mineral supplements as recommended by your health care provider. What happens during an annual well check? The services and screenings done by your health care provider during your annual well check will depend on your age, overall health, lifestyle risk factors, and family history  of disease. Counseling  Your health care provider may ask you questions about your: Alcohol use. Tobacco use. Drug use. Emotional well-being. Home and relationship well-being. Sexual activity. Eating habits. History of falls. Memory and ability to understand (cognition). Work and work Statistician. Reproductive health. Screening  You may have the following tests or measurements: Height, weight, and BMI. Blood pressure. Lipid and cholesterol levels. These may be checked every 5 years, or more frequently if you are over 49 years old. Skin check. Lung cancer screening. You may have this screening every year starting at age 76 if you have a 30-pack-year history of smoking and currently smoke or have quit within the past 15 years. Fecal occult blood test (FOBT) of the stool. You may have this test every year starting at age 57. Flexible sigmoidoscopy or colonoscopy. You may have a sigmoidoscopy every 5 years or a colonoscopy every 10 years starting at age 59. Hepatitis C blood test. Hepatitis B blood test. Sexually transmitted  disease (STD) testing. Diabetes screening. This is done by checking your blood sugar (glucose) after you have not eaten for a while (fasting). You may have this done every 1-3 years. Bone density scan. This is done to screen for osteoporosis. You may have this done starting at age 51. Mammogram. This may be done every 1-2 years. Talk to your health care provider about how often you should have regular mammograms. Talk with your health care provider about your test results, treatment options, and if necessary, the need for more tests. Vaccines  Your health care provider may recommend certain vaccines, such as: Influenza vaccine. This is recommended every year. Tetanus, diphtheria, and acellular pertussis (Tdap, Td) vaccine. You may need a Td booster every 10 years. Zoster vaccine. You may need this after age 44. Pneumococcal 13-valent conjugate (PCV13) vaccine. One dose is recommended after age 91. Pneumococcal polysaccharide (PPSV23) vaccine. One dose is recommended after age 2. Talk to your health care provider about which screenings and vaccines you need and how often you need them. This information is not intended to replace advice given to you by your health care provider. Make sure you discuss any questions you have with your health care provider. Document Released: 04/24/2015 Document Revised: 12/16/2015 Document Reviewed: 01/27/2015 Elsevier Interactive Patient Education  2017 [employer] Prevention in the Home Falls can cause injuries. They can happen to people of all ages. There are many things you can do to make your home safe and to help prevent falls. What can I do on the outside of my home? Regularly fix the edges of walkways and driveways and fix any cracks. Remove anything that might make you trip as you walk through a door, such as a raised step or threshold. Trim any bushes or trees on the path to your home. Use bright outdoor lighting. Clear any walking paths of  anything that might make someone trip, such as rocks or tools. Regularly check to see if handrails are loose or broken. Make sure that both sides of any steps have handrails. Any raised decks and porches should have guardrails on the edges. Have any leaves, snow, or ice cleared regularly. Use sand or salt on walking paths during winter. Clean up any spills in your garage right away. This includes oil or grease spills. What can I do in the bathroom? Use night lights. Install grab bars by the toilet and in the tub and shower. Do not use towel bars as grab bars. Use non-skid mats or  decals in the tub or shower. If you need to sit down in the shower, use a plastic, non-slip stool. Keep the floor dry. Clean up any water that spills on the floor as soon as it happens. Remove soap buildup in the tub or shower regularly. Attach bath mats securely with double-sided non-slip rug tape. Do not have throw rugs and other things on the floor that can make you trip. What can I do in the bedroom? Use night lights. Make sure that you have a light by your bed that is easy to reach. Do not use any sheets or blankets that are too big for your bed. They should not hang down onto the floor. Have a firm chair that has side arms. You can use this for support while you get dressed. Do not have throw rugs and other things on the floor that can make you trip. What can I do in the kitchen? Clean up any spills right away. Avoid walking on wet floors. Keep items that you use a lot in easy-to-reach places. If you need to reach something above you, use a strong step stool that has a grab bar. Keep electrical cords out of the way. Do not use floor polish or wax that makes floors slippery. If you must use wax, use non-skid floor wax. Do not have throw rugs and other things on the floor that can make you trip. What can I do with my stairs? Do not leave any items on the stairs. Make sure that there are handrails on both  sides of the stairs and use them. Fix handrails that are broken or loose. Make sure that handrails are as long as the stairways. Check any carpeting to make sure that it is firmly attached to the stairs. Fix any carpet that is loose or worn. Avoid having throw rugs at the top or bottom of the stairs. If you do have throw rugs, attach them to the floor with carpet tape. Make sure that you have a light switch at the top of the stairs and the bottom of the stairs. If you do not have them, ask someone to add them for you. What else can I do to help prevent falls? Wear shoes that: Do not have high heels. Have rubber bottoms. Are comfortable and fit you well. Are closed at the toe. Do not wear sandals. If you use a stepladder: Make sure that it is fully opened. Do not climb a closed stepladder. Make sure that both sides of the stepladder are locked into place. Ask someone to hold it for you, if possible. Clearly mark and make sure that you can see: Any grab bars or handrails. First and last steps. Where the edge of each step is. Use tools that help you move around (mobility aids) if they are needed. These include: Canes. Walkers. Scooters. Crutches. Turn on the lights when you go into a dark area. Replace any light bulbs as soon as they burn out. Set up your furniture so you have a clear path. Avoid moving your furniture around. If any of your floors are uneven, fix them. If there are any pets around you, be aware of where they are. Review your medicines with your doctor. Some medicines can make you feel dizzy. This can increase your chance of falling. Ask your doctor what other things that you can do to help prevent falls. This information is not intended to replace advice given to you by your health care provider. Make sure you  discuss any questions you have with your health care provider. Document Released: 01/22/2009 Document Revised: 09/03/2015 Document Reviewed: 05/02/2014 Elsevier  Interactive Patient Education  2017 [employer].

## 2022-05-05 ENCOUNTER — Telehealth: Payer: Self-pay

## 2022-05-05 ENCOUNTER — Ambulatory Visit: Payer: Medicare HMO

## 2022-05-05 NOTE — Telephone Encounter (Unsigned)
Copied from [place] [id number] [id number]. Topic: General - Other >> May 04, 2022  3:26 PM [name] wrote: Reason for CRM: Prior authorization is needed for Patient's echocardiogram. Patient is scheduled for tomorrow.

## 2022-05-05 NOTE — Telephone Encounter (Signed)
Copied from [place] [id number]. Topic: General - Other >> May 04, 2022  3:26 PM [name] wrote: Reason for CRM: Prior authorization is needed for Patient's echocardiogram. Patient is scheduled for tomorrow.

## 2022-05-06 ENCOUNTER — Ambulatory Visit (HOSPITAL_COMMUNITY): Payer: Medicare HMO | Attending: Cardiovascular Disease

## 2022-05-17 ENCOUNTER — Ambulatory Visit (HOSPITAL_COMMUNITY)
Admission: RE | Admit: 2022-05-17 | Discharge: 2022-05-17 | Disposition: A | Discharge status: Home / Self Care | Payer: Medicare HMO | Source: Ambulatory Visit | Attending: Internal Medicine | Admitting: Internal Medicine

## 2022-05-17 DIAGNOSIS — R609 Edema, unspecified: Secondary | ICD-10-CM | POA: Diagnosis not present

## 2022-05-17 DIAGNOSIS — I5032 Chronic diastolic (congestive) heart failure: Secondary | ICD-10-CM | POA: Diagnosis not present

## 2022-05-17 DIAGNOSIS — R600 Localized edema: Secondary | ICD-10-CM

## 2022-05-23 ENCOUNTER — Other Ambulatory Visit: Payer: Self-pay | Admitting: Internal Medicine

## 2022-05-24 NOTE — Telephone Encounter (Signed)
Requested Prescriptions  Pending Prescriptions Disp Refills   lisinopril-hydrochlorothiazide (ZESTORETIC) 20-12.5 MG tablet [Pharmacy Med Name: LISINOPRIL-HCTZ 20-12.5 MG TAB] 90 tablet 1    Sig: TAKE 1 TABLET BY MOUTH ONCE DAILY     Cardiovascular:  ACEI + Diuretic Combos Failed - 05/23/2022 11:34 AM      Failed - Cr in normal range and within 180 days    Creat  Date Value Ref Range Status  04/08/2022 1.13 (H) 0.50 - 1.05 mg/dL Final         Failed - Last BP in normal range    BP Readings from Last 1 Encounters:  04/19/22 (!) 140/90         Passed - Na in normal range and within 180 days    Sodium  Date Value Ref Range Status  04/08/2022 141 135 - 146 mmol/L Final  08/29/2017 141 134 - 144 mmol/L Final         Passed - K in normal range and within 180 days    Potassium  Date Value Ref Range Status  04/08/2022 4.2 3.5 - 5.3 mmol/L Final         Passed - eGFR is 30 or above and within 180 days    GFR, Est African American  Date Value Ref Range Status  01/02/2020 61 > OR = 60 mL/min/1.3m Final   GFR, Est Non African American  Date Value Ref Range Status  01/02/2020 52 (L) > OR = 60 mL/min/1.745mFinal   GFR, Estimated  Date Value Ref Range Status  04/07/2020 >60 >60 mL/min Final    Comment:    (NOTE) Calculated using the CKD-EPI Creatinine Equation (2021)    GFR  Date Value Ref Range Status  01/21/2015 60.34 >60.00 mL/min Final   eGFR  Date Value Ref Range Status  04/08/2022 53 (L) > OR = 60 mL/min/1.734minal         Passed - Patient is not pregnant      Passed - Valid encounter within last 6 months    Recent Outpatient Visits           1 month ago Chronic obstructive pulmonary disease with acute exacerbation ([facility]   1 month ago Need for immunization against influenza   [facility]   2 months ago Nausea and vomiting, unspecified vomiting  type   [facility]   5 months ago B12 deficiency   [facility]   7 months ago Episodic migraine   [place]       Future Appointments             In 4 months Baity, [facility]P [facility]

## 2022-05-30 ENCOUNTER — Ambulatory Visit
Payer: Medicare HMO | Attending: Student in an Organized Health Care Education/Training Program | Admitting: Student in an Organized Health Care Education/Training Program

## 2022-05-30 ENCOUNTER — Encounter: Payer: Self-pay | Admitting: Student in an Organized Health Care Education/Training Program

## 2022-05-30 VITALS — BP 157/98 | HR 88 | Temp 97.3°F | Resp 18 | Ht 68.5 in | Wt 196.0 lb

## 2022-05-30 DIAGNOSIS — M170 Bilateral primary osteoarthritis of knee: Secondary | ICD-10-CM | POA: Diagnosis not present

## 2022-05-30 DIAGNOSIS — G894 Chronic pain syndrome: Secondary | ICD-10-CM

## 2022-05-30 MED ORDER — TRIAMCINOLONE ACETONIDE 32 MG IX SRER
32.0000 mg | Freq: Once | INTRA_ARTICULAR | Status: AC
  Administered 2022-05-30: 32 mg via INTRA_ARTICULAR
  Filled 2022-05-30: qty 5

## 2022-05-30 MED ORDER — LIDOCAINE HCL 2 % IJ SOLN
20.0000 mL | Freq: Once | INTRAMUSCULAR | Status: AC
  Administered 2022-05-30: 400 mg
  Filled 2022-05-30: qty 20

## 2022-05-30 MED ORDER — LIDOCAINE HCL 2 % IJ SOLN
INTRAMUSCULAR | Status: AC
  Filled 2022-05-30: qty 20

## 2022-05-30 NOTE — Progress Notes (Signed)
PROVIDER NOTE: Interpretation of information contained herein should be left to medically-trained personnel. Specific patient instructions are provided elsewhere under "Patient Instructions" section of medical record. This document was created in part using STT-dictation technology, any transcriptional errors that may result from this process are unintentional.  Patient: Pamela Romero Type: Established DOB: 1951/03/15 MRN: [id number] PCP: Jearld Fenton, NP  Service: Procedure DOS: 05/30/2022 Setting: Ambulatory Location: Ambulatory outpatient facility Delivery: Face-to-face Provider: Gillis Santa, MD Specialty: Interventional Pain Management Specialty designation: 09 Location: Outpatient facility Ref. Prov.: Jearld Fenton, NP   Procedure [facility] Interventional Pain Management )    Procedure: Steroid Knee Injection (LONG ACTING Kennalog)  Laterality: Bilateral (-50) Level: Intra-articular  No.: R4 L5 Series: n/a Purpose: Therapeutic Indications: Knee arthralgia associated to osteoarthritis of the knee   Imaging: None required (CPT-20610) Analgesia: Skin infiltration w/ local anesthetics Sedation: None  NAS-11 score:   Pre-procedure: 6 /10   Post-procedure: 6 /10      1. Bilateral primary osteoarthritis of knee   2. Chronic pain syndrome    Pre-Procedure Preparation  Monitoring: As per clinic protocol.  Risk Assessment: Vitals:  [id number] body mass index is 29.37 kg/m as calculated from the following:   Height as of this encounter: 5' 8.5" (1.74 m).   Weight as of this encounter: 196 lb (88.9 kg)., Rate:88 , BP:(!) 157/98, Resp:18, Temp:(!) 97.3 F (36.3 C), SpO2:   Allergies: She has No Known Allergies.  Precautions: None required  Blood-thinner(s): None at this time  Coagulopathies: Reviewed. None identified.   Active Infection(s): Reviewed. None identified. Pamela Romero is afebrile   Location setting: Exam room Position: Sitting w/ knee bent 90  degrees Safety Precautions: Patient was assessed for positional comfort and pressure points before starting the procedure. Prepping solution: DuraPrep (Iodine Povacrylex [0.7% available iodine] and Isopropyl Alcohol, 74% w/w) Prep Area: Entire knee region Approach: percutaneous, just above the tibial plateau, medial to the infrapatellar tendon. Intended target: Intra-articular knee space Materials: Tray: Block Needle(s): Regular Qty: 1/side Length: 1.5-inch Gauge: 25G   Meds ordered this encounter  Medications   Triamcinolone Acetonide (ZILRETTA) intra-articular injection 32 mg    Maintain refrigerated.  Prepared suspension may be stored up to 4 hours at ambient conditions.   Triamcinolone Acetonide (ZILRETTA) intra-articular injection 32 mg    Maintain refrigerated.  Prepared suspension may be stored up to 4 hours at ambient conditions.   lidocaine (XYLOCAINE) 2 % (with pres) injection 400 mg     No orders of the defined types were placed in this encounter.     Time-out: 1102 I initiated and conducted the "Time-out" before starting the procedure, as per protocol. The patient was asked to participate by confirming the accuracy of the "Time Out" information. Verification of the correct person, site, and procedure were performed and confirmed by me, the nursing staff, and the patient. "Time-out" conducted as per Joint Commission's Universal Protocol (UP.01.01.01). Procedure checklist: Completed   H&P (Pre-op  Assessment)  Pamela Romero is a 72 y.o. (year old), female patient, seen today for interventional treatment. She  has a past surgical history that includes Thyroidectomy (2005); Cardiac catheterization (2006); Total abdominal hysterectomy; Breast surgery (Right, March 2014); Breast cyst aspiration (Right, 2014); and Esophagogastroduodenoscopy (N/A, 04/07/2020). [name] has a current medication list which includes the following prescription(s): albuterol, aspirin ec, atorvastatin,  docusate sodium, esomeprazole, famotidine, furosemide, gabapentin, hydrocodone-acetaminophen, levothyroxine, lisinopril-hydrochlorothiazide, ondansetron, polyethylene glycol powder, rizatriptan, and sertraline. Her primarily concern today is the Knee Pain  She has No Known Allergies.   Last encounter: My last encounter with her was on 11/03/2020. Pertinent problems: Pamela Romero has Bilateral primary osteoarthritis of knee and Osteoporosis on their pertinent problem list. Pain Assessment: Severity of Chronic pain is reported as a 6 /10. Location: Knee Left, Right, Anterior/Radaites from knee bilateral down lower leg to top of feet bilateral. Onset: More than a month ago. Quality: Constant, Throbbing. Timing: Constant. Modifying factor(s): "medication". Vitals:  height is 5' 8.5" (1.74 m) and weight is 196 lb (88.9 kg). Her temporal temperature is 97.3 F (36.3 C) (abnormal). Her blood pressure is 157/98 (abnormal) and her pulse is 88. Her respiration is 18.   Reason for encounter: "interventional pain management therapy due pain of at least four (4) weeks in duration, with failure to respond and/or inability to tolerate more conservative care.     Related imaging: Knee-L MR wo contrast:  Results for orders placed during the hospital encounter of 09/01/17  MR KNEE LEFT WO CONTRAST  Narrative CLINICAL DATA:  Persistent anterior knee pain for 1.5 months.  EXAM: MRI OF THE LEFT KNEE WITHOUT CONTRAST  TECHNIQUE: Multiplanar, multisequence MR imaging of the knee was performed. No intravenous contrast was administered.  COMPARISON:  None.  FINDINGS: MENISCI  Medial meniscus: There is an extensive horizontal tear posterior horn extending from the superior surface to the periphery. The midbody is degenerated and peripherally subluxed.  Lateral meniscus: Extensive horizontal tear involving the anterior horn and midbody with small parameniscal cysts at the level of the midbody best seen  on image 34 of series 7 and series 31 of series 5.  LIGAMENTS  Cruciates:  Intact.  Collaterals:  Intact.  CARTILAGE  Patellofemoral: Partial-thickness cartilage loss of the superior aspect of the medial facet of the patella.  Medial: Denuding of the articular cartilage of the medial tibial plateau with subcortical edema and subcortical cyst formation in the periphery of the tibial plateau. Thinning of the articular cartilage of the femoral condyle.  Lateral: Focal full-thickness cartilage loss in the posterior aspect of the lateral tibial plateau.  Joint: Trace joint effusion. Normal Hoffa's fat pad. No plical thickening.  Popliteal Fossa:  Small Baker's cyst.  Intact popliteus tendon.  Extensor Mechanism:  Normal.  Bones: Moderate marginal osteophytes in the medial compartment. Tiny marginal osteophytes in the lateral compartment.  Other: None  IMPRESSION: 1. Extensive horizontal tear of the posterior horn of the medial meniscus. 2. Extensive horizontal tear of the anterior horn and midbody of the lateral meniscus. 3. Moderate osteoarthritis of the medial compartment with full-thickness cartilage loss and subcortical edema and subcortical cyst formation in the medial tibial plateau.   Electronically Signed By: Lorriane Shire M.D. On: 09/01/2017 10:59 \    Site Confirmation: Ms. Decena was asked to confirm the procedure and laterality before marking the site.  Consent: Before the procedure and under the influence of no sedative(s), amnesic(s), or anxiolytics, the patient was informed of the treatment options, risks and possible complications. To fulfill our ethical and legal obligations, as recommended by the American Medical Association's Code of Ethics, I have informed the patient of my clinical impression; the nature and purpose of the treatment or procedure; the risks, benefits, and possible complications of the intervention; the alternatives, including doing  nothing; the risk(s) and benefit(s) of the alternative treatment(s) or procedure(s); and the risk(s) and benefit(s) of doing nothing. The patient was provided information about the general risks and possible complications associated with the procedure. These may  include, but are not limited to: failure to achieve desired goals, infection, bleeding, organ or nerve damage, allergic reactions, paralysis, and death. In addition, the patient was informed of those risks and complications associated to Spine-related procedures, such as failure to decrease pain; infection (i.e.: Meningitis, epidural or intraspinal abscess); bleeding (i.e.: epidural hematoma, subarachnoid hemorrhage, or any other type of intraspinal or peri-dural bleeding); organ or nerve damage (i.e.: Any type of peripheral nerve, nerve root, or spinal cord injury) with subsequent damage to sensory, motor, and/or autonomic systems, resulting in permanent pain, numbness, and/or weakness of one or several areas of the body; allergic reactions; (i.e.: anaphylactic reaction); and/or death. Furthermore, the patient was informed of those risks and complications associated with the medications. These include, but are not limited to: allergic reactions (i.e.: anaphylactic or anaphylactoid reaction(s)); adrenal axis suppression; blood sugar elevation that in diabetics may result in ketoacidosis or comma; water retention that in patients with history of congestive heart failure may result in shortness of breath, pulmonary edema, and decompensation with resultant heart failure; weight gain; swelling or edema; medication-induced neural toxicity; particulate matter embolism and blood vessel occlusion with resultant organ, and/or nervous system infarction; and/or aseptic necrosis of one or more joints. Finally, the patient was informed that Medicine is not an exact science; therefore, there is also the possibility of unforeseen or unpredictable risks and/or possible  complications that may result in a catastrophic outcome. The patient indicated having understood very clearly. We have given the patient no guarantees and we have made no promises. Enough time was given to the patient to ask questions, all of which were answered to the patient's satisfaction. Ms. Mercadel has indicated that she wanted to continue with the procedure. Attestation: I, the ordering provider, attest that I have discussed with the patient the benefits, risks, side-effects, alternatives, likelihood of achieving goals, and potential problems during recovery for the procedure that I have provided informed consent.  Date  Time: 05/30/2022 10:24 AM   Prophylactic antibiotics  Anti-infectives (From admission, onward)    None      Indication(s): None identified   Description of procedure   Start Time: 1103 hrs  Local Anesthesia: Once the patient was positioned, prepped, and time-out was completed. The target area was identified located. The skin was marked with an approved surgical skin marker. Once marked, the skin (epidermis, dermis, and hypodermis), and deeper tissues (fat, connective tissue and muscle) were infiltrated with a small amount of a short-acting local anesthetic, loaded on a 10cc syringe with a 25G, 1.5-in  Needle. An appropriate amount of time was allowed for local anesthetics to take effect before proceeding to the next step. Local Anesthetic: Lidocaine 1-2% The unused portion of the local anesthetic was discarded in the proper designated containers. Safety Precautions: Aspiration looking for blood return was conducted prior to all injections. At no point did I inject any substances, as a needle was being advanced. Before injecting, the patient was told to immediately notify me if she was experiencing any new onset of "ringing in the ears, or metallic taste in the mouth". No attempts were made at seeking any paresthesias. Safe injection practices and needle disposal  techniques used. Medications properly checked for expiration dates. SDV (single dose vial) medications used. After the completion of the procedure, all disposable equipment used was discarded in the proper designated medical waste containers.  Technical description: Protocol guidelines were followed. After positioning, the target area was identified and prepped in the usual manner. Skin & deeper  tissues infiltrated with local anesthetic. Appropriate amount of time allowed to pass for local anesthetics to take effect. Proper needle placement secured. Once satisfactory needle placement was confirmed, I proceeded to inject the desired solution in slow, incremental fashion, intermittently assessing for discomfort or any signs of abnormal or undesired spread of substance. Once completed, the needle was removed and disposed of, as per hospital protocols. The area was cleaned, making sure to leave some of the prepping solution back to take advantage of its long term bactericidal properties.  Aspiration:  Negative   Vitals:   05/30/22 1026  BP: (!) 157/98  Pulse: 88  Resp: 18  Temp: (!) 97.3 F (36.3 C)  TempSrc: Temporal  Weight: 196 lb (88.9 kg)  Height: 5' 8.5" (1.74 m)     End Time: 1106 hrs     Post-op assessment  Post-procedure Vital Signs:  Pulse/HCG Rate: 88  Temp: (!) 97.3 F (36.3 C)  Resp: 18 BP: (!) 157/98 SpO2:    EBL: None  Complications: No immediate post-treatment complications observed by team, or reported by patient.  Note: The patient tolerated the entire procedure well. A repeat set of vitals were taken after the procedure and the patient was kept under observation following institutional policy, for this type of procedure. Post-procedural neurological assessment was performed, showing return to baseline, prior to discharge. The patient was provided with post-procedure discharge instructions, including a section on how to identify potential problems. Should any  problems arise concerning this procedure, the patient was given instructions to immediately contact us, at any time, without hesitation. In any case, we plan to contact the patient by telephone for a follow-up status report regarding this interventional procedure.  Comments:  No additional relevant information.   Plan of care  Chronic Opioid Analgesic:  Hydrocodone 7.5 mg 3 times daily as needed with an extra quantity 15/month (#105)For severe breakthrough pain    Medications administered: We administered Triamcinolone Acetonide, Triamcinolone Acetonide, and lidocaine.  5 cc of triamcinolone injected in each knee.  Follow-up plan:   Return for Keep sch. appt.     Recent Visits Date Type Provider Dept  04/19/22 Office Visit Gillis Santa, MD [facility]  Showing recent visits within past 90 days and meeting all other requirements Today's Visits Date Type Provider Dept  05/30/22 Procedure visit Gillis Santa, MD [facility]  Showing today's visits and meeting all other requirements Future Appointments Date Type Provider Dept  07/12/22 Appointment Gillis Santa, MD [facility]  Showing future appointments within next 90 days and meeting all other requirements   Disposition: Discharge home  Discharge (Date  Time): 05/30/2022; 1109 hrs.   Primary Care Physician: Jearld Fenton, NP Location: [facility] Note by: Gillis Santa, MD Date: 05/30/2022; Time: 11:17 AM  DISCLAIMER: Medicine is not an exact science. It has no guarantees or warranties. The decision to proceed with this intervention was based on the information collected from the patient. Conclusions were drawn from the patient's questionnaire, interview, and examination. Because information was provided in large part by the patient, it cannot be guaranteed that it has not been purposely or unconsciously manipulated or altered. Every effort has been made to obtain as much  accurate, relevant, available data as possible. Always take into account that the treatment will also be dependent on availability of resources and existing treatment guidelines, considered by other Pain Management Specialists as being common knowledge and practice, at the time of the intervention. It  is also important to point out that variation in procedural techniques and pharmacological choices are the acceptable norm. For Medico-Legal review purposes, the indications, contraindications, technique, and results of the these procedures should only be evaluated, judged and interpreted by a Board-Certified Interventional Pain Specialist with extensive familiarity and expertise in the same exact procedure and technique.

## 2022-05-30 NOTE — Progress Notes (Signed)
Safety precautions to be maintained throughout the outpatient stay will include: orient to surroundings, keep bed in low position, maintain call bell within reach at all times, provide assistance with transfer out of bed and ambulation.  

## 2022-05-31 ENCOUNTER — Telehealth: Payer: Self-pay

## 2022-05-31 NOTE — Telephone Encounter (Signed)
Post procedure follow up.  Patient sttates she is doing well.

## 2022-06-07 ENCOUNTER — Other Ambulatory Visit: Payer: Self-pay

## 2022-06-07 ENCOUNTER — Ambulatory Visit: Payer: Medicare HMO | Admitting: Gastroenterology

## 2022-06-21 ENCOUNTER — Telehealth: Payer: Self-pay

## 2022-06-21 NOTE — Telephone Encounter (Unsigned)
Copied from [place] [id number]. Topic: General - Other >> Jun 21, 2022 11:17 AM Denman George T wrote: Reason for CRM: Gabriel Cirri [phone] from [place] called in for assistance. Patient has echocardiogram scheduled and need prior authorization. Please update appointment note when completed

## 2022-06-22 ENCOUNTER — Ambulatory Visit: Payer: Medicare HMO | Attending: Internal Medicine

## 2022-06-22 DIAGNOSIS — I5032 Chronic diastolic (congestive) heart failure: Secondary | ICD-10-CM

## 2022-06-22 DIAGNOSIS — R600 Localized edema: Secondary | ICD-10-CM | POA: Diagnosis not present

## 2022-06-22 DIAGNOSIS — R609 Edema, unspecified: Secondary | ICD-10-CM

## 2022-06-23 LAB — ECHOCARDIOGRAM COMPLETE
AR max vel: 2.07 cm2
AV Area VTI: 2.13 cm2
AV Area mean vel: 2.02 cm2
AV Mean grad: 4 mmHg
AV Peak grad: 7.8 mmHg
Ao pk vel: 1.4 m/s
Area-P 1/2: 3.99 cm2
Calc EF: 54.3 %
Single Plane A2C EF: 52.3 %
Single Plane A4C EF: 52.4 %

## 2022-06-24 ENCOUNTER — Telehealth: Payer: Self-pay

## 2022-06-24 DIAGNOSIS — R112 Nausea with vomiting, unspecified: Secondary | ICD-10-CM

## 2022-06-24 DIAGNOSIS — K2100 Gastro-esophageal reflux disease with esophagitis, without bleeding: Secondary | ICD-10-CM

## 2022-06-24 NOTE — Telephone Encounter (Signed)
-----   Message from Jearld Fenton, NP sent at 06/23/2022  3:32 PM EDT ----- Echocardiogram shows evidence of grade 1 diastolic dysfunction but no significant heart failure.  This is reassuring.

## 2022-06-24 NOTE — Telephone Encounter (Signed)
Pt advised.  She states she is ready for a GI referral now.  She has been having a lot of trouble with nausea and vomiting since at lease 01/2022.  (See office note 11/29)     Thanks,   -Mickel Baas

## 2022-06-27 NOTE — Addendum Note (Signed)
Addended by: Jearld Fenton on: 06/27/2022 09:15 AM   Modules accepted: Orders

## 2022-06-27 NOTE — Telephone Encounter (Signed)
Referral placed to GI.  She had an appointment 4/11 which she canceled.  She also no showed an appointment in February.

## 2022-07-12 ENCOUNTER — Ambulatory Visit
Payer: Medicare HMO | Attending: Student in an Organized Health Care Education/Training Program | Admitting: Student in an Organized Health Care Education/Training Program

## 2022-07-12 ENCOUNTER — Encounter: Payer: Self-pay | Admitting: Student in an Organized Health Care Education/Training Program

## 2022-07-12 VITALS — BP 109/78 | HR 99 | Temp 96.9°F | Resp 17 | Ht 68.5 in | Wt 189.0 lb

## 2022-07-12 DIAGNOSIS — G8929 Other chronic pain: Secondary | ICD-10-CM | POA: Diagnosis not present

## 2022-07-12 DIAGNOSIS — M25571 Pain in right ankle and joints of right foot: Secondary | ICD-10-CM

## 2022-07-12 DIAGNOSIS — M5416 Radiculopathy, lumbar region: Secondary | ICD-10-CM

## 2022-07-12 DIAGNOSIS — G894 Chronic pain syndrome: Secondary | ICD-10-CM | POA: Insufficient documentation

## 2022-07-12 DIAGNOSIS — M170 Bilateral primary osteoarthritis of knee: Secondary | ICD-10-CM

## 2022-07-12 DIAGNOSIS — M722 Plantar fascial fibromatosis: Secondary | ICD-10-CM | POA: Insufficient documentation

## 2022-07-12 DIAGNOSIS — M1712 Unilateral primary osteoarthritis, left knee: Secondary | ICD-10-CM

## 2022-07-12 MED ORDER — HYDROCODONE-ACETAMINOPHEN 7.5-325 MG PO TABS: 1.0000 | ORAL_TABLET | Freq: Four times a day (QID) | ORAL | 0 refills | Status: DC | PRN

## 2022-07-12 MED ORDER — HYDROCODONE-ACETAMINOPHEN 7.5-325 MG PO TABS: 1.0000 | ORAL_TABLET | Freq: Four times a day (QID) | ORAL | 0 refills | Status: AC | PRN

## 2022-07-12 MED ORDER — GABAPENTIN 300 MG PO CAPS: 300.0000 mg | ORAL_CAPSULE | Freq: Every day | ORAL | 2 refills | Status: DC

## 2022-07-12 NOTE — Progress Notes (Signed)
Nursing Pain Medication Assessment:  Safety precautions to be maintained throughout the outpatient stay will include: orient to surroundings, keep bed in low position, maintain call bell within reach at all times, provide assistance with transfer out of bed and ambulation.  Medication Inspection Compliance: Pill count conducted under aseptic conditions, in front of the patient. Neither the pills nor the bottle was removed from the patient's sight at any time. Once count was completed pills were immediately returned to the patient in their original bottle.  Medication: Hydrocodone/APAP Pill/Patch Count:  25 of 105 pills remain Pill/Patch Appearance: Markings consistent with prescribed medication Bottle Appearance: Standard pharmacy container. Clearly labeled. Filled Date: 03 / 14 / 2024 Last Medication intake:  Today

## 2022-07-12 NOTE — Progress Notes (Signed)
PROVIDER NOTE: Information contained herein reflects review and annotations entered in association with encounter. Interpretation of such information and data should be left to medically-trained personnel. Information provided to patient can be located elsewhere in the medical record under "Patient Instructions". Document created using STT-dictation technology, any transcriptional errors that may result from process are unintentional.    Patient: Pamela Romero  Service Category: E/M  Provider: Gillis Santa, MD  DOB: 1950-10-16  DOS: 07/12/2022  Specialty: Interventional Pain Management  MRN: [id number]  Setting: Ambulatory outpatient  PCP: Jearld Fenton, NP  Type: Established Patient    Referring Provider: Jearld Fenton, NP  Location: Office  Delivery: Face-to-face     HPI  [name], a 72 y.o. year old female, is here today because of her Bilateral primary osteoarthritis of knee [M17.0]. Ms. Ryczek primary complain today is Knee Pain (bilat)  Last encounter: My last encounter with her was on 05/30/22 Pertinent problems: [name] has Bilateral primary osteoarthritis of knee and Osteoporosis on their pertinent problem list. Pain Assessment: Severity of Chronic pain is reported as a 6 /10. Location: Knee Right, Left/down legs bilat to top of feet. Onset: More than a month ago. Quality: Throbbing, Constant, Cramping. Timing: Constant. Modifying factor(s): meds, knee injections, sitting. Vitals:  height is 5' 8.5" (1.74 m) and weight is 189 lb (85.7 kg). Her temporal temperature is 96.9 F (36.1 C) (abnormal). Her blood pressure is 109/78 and her pulse is 99. Her respiration is 17 and oxygen saturation is 98%.   Reason for encounter:  MM and PPE eval  -Had ECHO and LE doppler study of left leg -Venous insuffiencey of left leg, no arterial occulusion -Received Lasix for 3 days, and has watched Na intake and is wearing foot stockings to help with swelling -Excellent pain relief  with long acting Kennalog- would like to repeat in the next couple of months    Post-procedure evaluation    Procedure: Steroid Knee Injection (LONG ACTING Kennalog)  Laterality: Bilateral (-50) Level: Intra-articular  No.: R4 L5 Series: n/a Purpose: Therapeutic Indications: Knee arthralgia associated to osteoarthritis of the knee   Imaging: None required (CPT-20610) Analgesia: Skin infiltration w/ local anesthetics Sedation: None  NAS-11 score:   Pre-procedure: 6 /10   Post-procedure: 6 /10       Effectiveness:  Initial hour after procedure: 100 %  Subsequent 4-6 hours post-procedure: 100 %  Analgesia past initial 6 hours: 75 %  Ongoing improvement:  Analgesic:  75% Function: [name] reports improvement in function ROM: [name] reports improvement in ROM   Pharmacotherapy Assessment  Analgesic: Hydrocodone 7.5 mg 3 times daily as needed with an extra quantity 15/month (#105)For severe breakthrough pain    Monitoring: [place] PMP: PDMP reviewed during this encounter.       Pharmacotherapy: No side-effects or adverse reactions reported. Compliance: No problems identified. Effectiveness: Clinically acceptable.  UDS:  Summary  Date Value Ref Range Status  11/02/2021 Note  Final    Comment:    ==================================================================== [employer] [facility] (MW) ==================================================================== Test                             Result       Flag       Units  Drug Present and Declared for Prescription Verification   Hydrocodone                    1063  EXPECTED   ng/mg creat   Hydromorphone                  119          EXPECTED   ng/mg creat   Dihydrocodeine                 191          EXPECTED   ng/mg creat   Norhydrocodone                 2059         EXPECTED   ng/mg creat    Sources of hydrocodone include scheduled prescription medications.    Hydromorphone, dihydrocodeine and  norhydrocodone are expected    metabolites of hydrocodone. Hydromorphone and dihydrocodeine are    also available as scheduled prescription medications.  ==================================================================== Test                      Result    Flag   Units      Ref Range   Creatinine              172              mg/dL      >=20 ==================================================================== Declared Medications:  The flagging and interpretation on this report are based on the  following declared medications.  Unexpected results may arise from  inaccuracies in the declared medications.   **Note: The testing scope of this panel includes these medications:   Hydrocodone (Norco)   **Note: The testing scope of this panel does not include the  following reported medications:   Acetaminophen (Norco)  Albuterol (Ventolin HFA)  Aspirin  Atorvastatin (Lipitor)  Esomeprazole (Nexium)  Gabapentin (Neurontin)  Hydrochlorothiazide (Zestoretic)  Levothyroxine (Synthroid)  Lisinopril (Zestoretic)  Ondansetron (Zofran)  Rizatriptan (Maxalt)  Sertraline (Zoloft)  Vitamin B12 ==================================================================== For clinical consultation, please call [phone]. ====================================================================       ROS  Constitutional: Denies any fever or chills Gastrointestinal: No reported hemesis, hematochezia, vomiting, or acute GI distress Musculoskeletal:  Left knee pain right knee pain as well,improved after injection Neurological: No reported episodes of acute onset apraxia, aphasia, dysarthria, agnosia, amnesia, paralysis, loss of coordination, or loss of consciousness  Medication Review  HYDROcodone-acetaminophen, albuterol, aspirin EC, atorvastatin, docusate sodium, esomeprazole, famotidine, gabapentin, levothyroxine, lisinopril-hydrochlorothiazide, ondansetron, polyethylene glycol powder,  rizatriptan, and sertraline  History Review  Allergy: Ms. Carosella has No Known Allergies. Drug: Ms. Bongard  reports no history of drug use. Alcohol:  reports no history of alcohol use. Tobacco:  reports that she quit smoking about 4 years ago. Her smoking use included cigarettes. She has a 25.00 pack-year smoking history. She has quit using smokeless tobacco. Social: Ms. Brengle  reports that she quit smoking about 4 years ago. Her smoking use included cigarettes. She has a 25.00 pack-year smoking history. She has quit using smokeless tobacco. She reports that she does not drink alcohol and does not use drugs. Medical:  has a past medical history of Abnormal EKG, CAD (coronary artery disease), Cancer, COPD (chronic obstructive pulmonary disease), Endometrial adenocarcinoma (2011), Hyperlipidemia, Hypertension, Osteoarthritis, Reflux gastritis, Thyroid disease, tobacco abuse, and Vaginal delivery. Surgical: Ms. Higley  has a past surgical history that includes Thyroidectomy (2005); Cardiac catheterization (2006); Total abdominal hysterectomy; Breast surgery (Right, March 2014); Breast cyst aspiration (Right, 2014); and Esophagogastroduodenoscopy (N/A, 04/07/2020). Family: family history includes Alcohol abuse in her father, maternal aunt, and mother; Cancer in  her brother, brother, brother, and maternal aunt; Heart disease in her brother.  Laboratory Chemistry Profile   Renal Lab Results  Component Value Date   BUN 20 04/08/2022   CREATININE 1.13 (H) 04/08/2022   BCR 18 04/08/2022   GFR 60.34 01/21/2015   GFRAA 61 01/02/2020   GFRNONAA >60 04/07/2020    Hepatic Lab Results  Component Value Date   AST 10 04/08/2022   ALT 7 04/08/2022   ALBUMIN 4.4 08/29/2017   ALKPHOS 126 (H) 08/29/2017   HCVAB NEGATIVE 01/21/2015    Electrolytes Lab Results  Component Value Date   NA 141 04/08/2022   K 4.2 04/08/2022   CL 108 04/08/2022   CALCIUM 9.6 04/08/2022   MG 1.3 (L) 09/15/2021     Bone Lab Results  Component Value Date   VD25OH 18 (L) 12/09/2021    Inflammation (CRP: Acute Phase) (ESR: Chronic Phase) Lab Results  Component Value Date   ESRSEDRATE 19 03/30/2012         Note: Above Lab results reviewed.   Physical Exam  General appearance: Well nourished, well developed, and well hydrated. In no apparent acute distress Mental status: Alert, oriented x 3 (person, place, & time)       Respiratory: No evidence of acute respiratory distress Eyes: PERLA Vitals: BP 109/78   Pulse 99   Temp (!) 96.9 F (36.1 C) (Temporal)   Resp 17   Ht 5' 8.5" (1.74 m)   Wt 189 lb (85.7 kg)   SpO2 98%   BMI 28.32 kg/m  BMI: Estimated body mass index is 28.32 kg/m as calculated from the following:   Height as of this encounter: 5' 8.5" (1.74 m).   Weight as of this encounter: 189 lb (85.7 kg). Ideal: Ideal body weight: 65 kg (143 lb 6.5 oz) Adjusted ideal body weight: 73.3 kg (161 lb 10.3 oz)  Improved knee pain  Assessment    Diagnosis   1. Bilateral primary osteoarthritis of knee   2. Chronic radicular lumbar pain (left)   3. Plantar fascia syndrome   4. Chronic pain of right ankle   5. Primary osteoarthritis of left knee   6. Chronic pain syndrome        Plan of Care   Requested Prescriptions   Signed Prescriptions Disp Refills   HYDROcodone-acetaminophen (NORCO) 7.5-325 MG tablet 105 tablet 0    Sig: Take 1 tablet by mouth every 6 (six) hours as needed for severe pain. Must last 30 days.   HYDROcodone-acetaminophen (NORCO) 7.5-325 MG tablet 105 tablet 0    Sig: Take 1 tablet by mouth every 6 (six) hours as needed for severe pain. Must last 30 days.   HYDROcodone-acetaminophen (NORCO) 7.5-325 MG tablet 105 tablet 0    Sig: Take 1 tablet by mouth every 6 (six) hours as needed for severe pain. Must last 30 days.   gabapentin (NEURONTIN) 300 MG capsule 180 capsule 2    Sig: Take 1-2 capsules (300-600 mg total) by mouth at bedtime.   Orders Placed This  Encounter  Procedures   KNEE INJECTION    Local Anesthetic & Steroid injection.    Standing Status:   Future    Standing Expiration Date:   10/11/2022    Scheduling Instructions:     Side: Bilateral     Sedation: None     Timeframe: As soon as schedule allows    Order Specific Question:   Where will this procedure be performed?    Answer:  [facility]       Follow-up plan:   Return in about 3 months (around 10/20/2022) for Medication Management, in person.    Recent Visits Date Type Provider Dept  05/30/22 Procedure visit Gillis Santa, MD [facility]  04/19/22 Office Visit Gillis Santa, MD [facility]  Showing recent visits within past 90 days and meeting all other requirements Today's Visits Date Type Provider Dept  07/12/22 Office Visit Gillis Santa, MD [facility]  Showing today's visits and meeting all other requirements Future Appointments No visits were found meeting these conditions. Showing future appointments within next 90 days and meeting all other requirements  I discussed the assessment and treatment plan with the patient. The patient was provided an opportunity to ask questions and all were answered. The patient agreed with the plan and demonstrated an understanding of the instructions.  Patient advised to call back or seek an in-person evaluation if the symptoms or condition worsens.  Duration of encounter: 35minutes.  Note by: Gillis Santa, MD Date: 07/12/2022; Time: 1:58 PM

## 2022-07-12 NOTE — Patient Instructions (Signed)
GENERAL RISKS AND COMPLICATIONS  What are the risk, side effects and possible complications? Generally speaking, most procedures are safe.  However, with any procedure there are risks, side effects, and the possibility of complications.  The risks and complications are dependent upon the sites that are lesioned, or the type of nerve block to be performed.  The closer the procedure is to the spine, the more serious the risks are.  Great care is taken when placing the radio frequency needles, block needles or lesioning probes, but sometimes complications can occur. Infection: Any time there is an injection through the skin, there is a risk of infection.  This is why sterile conditions are used for these blocks.  There are four possible types of infection. Localized skin infection. Central Nervous System Infection-This can be in the form of Meningitis, which can be deadly. Epidural Infections-This can be in the form of an epidural abscess, which can cause pressure inside of the spine, causing compression of the spinal cord with subsequent paralysis. This would require an emergency surgery to decompress, and there are no guarantees that the patient would recover from the paralysis. Discitis-This is an infection of the intervertebral discs.  It occurs in about 1% of discography procedures.  It is difficult to treat and it may lead to surgery.        2. Pain: the needles have to go through skin and soft tissues, will cause soreness.       3. Damage to internal structures:  The nerves to be lesioned may be near blood vessels or    other nerves which can be potentially damaged.       4. Bleeding: Bleeding is more common if the patient is taking blood thinners such as  aspirin, Coumadin, Ticiid, Plavix, etc., or if he/she have some genetic predisposition  such as hemophilia. Bleeding into the spinal canal can cause compression of the spinal  cord with subsequent paralysis.  This would require an emergency  surgery to  decompress and there are no guarantees that the patient would recover from the  paralysis.       5. Pneumothorax:  Puncturing of a lung is a possibility, every time a needle is introduced in  the area of the chest or upper back.  Pneumothorax refers to free air around the  collapsed lung(s), inside of the thoracic cavity (chest cavity).  Another two possible  complications related to a similar event would include: Hemothorax and Chylothorax.   These are variations of the Pneumothorax, where instead of air around the collapsed  lung(s), you may have blood or chyle, respectively.       6. Spinal headaches: They may occur with any procedures in the area of the spine.       7. Persistent CSF (Cerebro-Spinal Fluid) leakage: This is a rare problem, but may occur  with prolonged intrathecal or epidural catheters either due to the formation of a fistulous  track or a dural tear.       8. Nerve damage: By working so close to the spinal cord, there is always a possibility of  nerve damage, which could be as serious as a permanent spinal cord injury with  paralysis.       9. Death:  Although rare, severe deadly allergic reactions known as "Anaphylactic  reaction" can occur to any of the medications used.      10. Worsening of the symptoms:  We can always make thing worse.  What are the chances   of something like this happening? Chances of any of this occuring are extremely low.  By statistics, you have more of a chance of getting killed in a motor vehicle accident: while driving to the hospital than any of the above occurring .  Nevertheless, you should be aware that they are possibilities.  In general, it is similar to taking a shower.  Everybody knows that you can slip, hit your head and get killed.  Does that mean that you should not shower again?  Nevertheless always keep in mind that statistics do not mean anything if you happen to be on the wrong side of them.  Even if a procedure has a 1 (one) in a  1,000,000 (million) chance of going wrong, it you happen to be that one..Also, keep in mind that by statistics, you have more of a chance of having something go wrong when taking medications.  Who should not have this procedure? If you are on a blood thinning medication (e.g. Coumadin, Plavix, see list of "Blood Thinners"), or if you have an active infection going on, you should not have the procedure.  If you are taking any blood thinners, please inform your physician.  How should I prepare for this procedure? Do not eat or drink anything at least six hours prior to the procedure. Bring a driver with you .  It cannot be a taxi. Come accompanied by an adult that can drive you back, and that is strong enough to help you if your legs get weak or numb from the local anesthetic. Take all of your medicines the morning of the procedure with just enough water to swallow them. If you have diabetes, make sure that you are scheduled to have your procedure done first thing in the morning, whenever possible. If you have diabetes, take only half of your insulin dose and notify our nurse that you have done so as soon as you arrive at the clinic. If you are diabetic, but only take blood sugar pills (oral hypoglycemic), then do not take them on the morning of your procedure.  You may take them after you have had the procedure. Do not take aspirin or any aspirin-containing medications, at least eleven (11) days prior to the procedure.  They may prolong bleeding. Wear loose fitting clothing that may be easy to take off and that you would not mind if it got stained with Betadine or blood. Do not wear any jewelry or perfume Remove any nail coloring.  It will interfere with some of our monitoring equipment.  NOTE: Remember that this is not meant to be interpreted as a complete list of all possible complications.  Unforeseen problems may occur.  BLOOD THINNERS The following drugs contain aspirin or other products,  which can cause increased bleeding during surgery and should not be taken for 2 weeks prior to and 1 week after surgery.  If you should need take something for relief of minor pain, you may take acetaminophen which is found in Tylenol,m Datril, Anacin-3 and Panadol. It is not blood thinner. The products listed below are.  Do not take any of the products listed below in addition to any listed on your instruction sheet.  A.P.C or A.P.C with Codeine Codeine Phosphate Capsules #3 Ibuprofen Ridaura  ABC compound Congesprin Imuran rimadil  Advil Cope Indocin Robaxisal  Alka-Seltzer Effervescent Pain Reliever and Antacid Coricidin or Coricidin-D  Indomethacin Rufen  Alka-Seltzer plus Cold Medicine Cosprin Ketoprofen S-A-C Tablets  Anacin Analgesic Tablets or Capsules Coumadin   Korlgesic Salflex  Anacin Extra Strength Analgesic tablets or capsules CP-2 Tablets Lanoril Salicylate  Anaprox Cuprimine Capsules Levenox Salocol  Anexsia-D Dalteparin Magan Salsalate  Anodynos Darvon compound Magnesium Salicylate Sine-off  Ansaid Dasin Capsules Magsal Sodium Salicylate  Anturane Depen Capsules Marnal Soma  APF Arthritis pain formula Dewitt's Pills Measurin Stanback  Argesic Dia-Gesic Meclofenamic Sulfinpyrazone  Arthritis Bayer Timed Release Aspirin Diclofenac Meclomen Sulindac  Arthritis pain formula Anacin Dicumarol Medipren Supac  Analgesic (Safety coated) Arthralgen Diffunasal Mefanamic Suprofen  Arthritis Strength Bufferin Dihydrocodeine Mepro Compound Suprol  Arthropan liquid Dopirydamole Methcarbomol with Aspirin Synalgos  ASA tablets/Enseals Disalcid Micrainin Tagament  Ascriptin Doan's Midol Talwin  Ascriptin A/D Dolene Mobidin Tanderil  Ascriptin Extra Strength Dolobid Moblgesic Ticlid  Ascriptin with Codeine Doloprin or Doloprin with Codeine Momentum Tolectin  Asperbuf Duoprin Mono-gesic Trendar  Aspergum Duradyne Motrin or Motrin IB Triminicin  Aspirin plain, buffered or enteric coated  Durasal Myochrisine Trigesic  Aspirin Suppositories Easprin Nalfon Trillsate  Aspirin with Codeine Ecotrin Regular or Extra Strength Naprosyn Uracel  Atromid-S Efficin Naproxen Ursinus  Auranofin Capsules Elmiron Neocylate Vanquish  Axotal Emagrin Norgesic Verin  Azathioprine Empirin or Empirin with Codeine Normiflo Vitamin E  Azolid Emprazil Nuprin Voltaren  Bayer Aspirin plain, buffered or children's or timed BC Tablets or powders Encaprin Orgaran Warfarin Sodium  Buff-a-Comp Enoxaparin Orudis Zorpin  Buff-a-Comp with Codeine Equegesic Os-Cal-Gesic   Buffaprin Excedrin plain, buffered or Extra Strength Oxalid   Bufferin Arthritis Strength Feldene Oxphenbutazone   Bufferin plain or Extra Strength Feldene Capsules Oxycodone with Aspirin   Bufferin with Codeine Fenoprofen Fenoprofen Pabalate or Pabalate-SF   Buffets II Flogesic Panagesic   Buffinol plain or Extra Strength Florinal or Florinal with Codeine Panwarfarin   Buf-Tabs Flurbiprofen Penicillamine   Butalbital Compound Four-way cold tablets Penicillin   Butazolidin Fragmin Pepto-Bismol   Carbenicillin Geminisyn Percodan   Carna Arthritis Reliever Geopen Persantine   Carprofen Gold's salt Persistin   Chloramphenicol Goody's Phenylbutazone   Chloromycetin Haltrain Piroxlcam   Clmetidine heparin Plaquenil   Cllnoril Hyco-pap Ponstel   Clofibrate Hydroxy chloroquine Propoxyphen         Before stopping any of these medications, be sure to consult the physician who ordered them.  Some, such as Coumadin (Warfarin) are ordered to prevent or treat serious conditions such as "deep thrombosis", "pumonary embolisms", and other heart problems.  The amount of time that you may need off of the medication may also vary with the medication and the reason for which you were taking it.  If you are taking any of these medications, please make sure you notify your pain physician before you undergo any procedures.         Knee Injection A  knee injection is a procedure to get medicine into your knee joint to relieve the pain, swelling, and stiffness of arthritis. Your health care provider uses a needle to inject medicine, which may also help to lubricate and cushion your knee joint. You may need more than one injection. Tell a health care provider about: Any allergies you have. All medicines you are taking, including vitamins, herbs, eye drops, creams, and over-the-counter medicines. Any problems you or family members have had with anesthetic medicines. Any blood disorders you have. Any surgeries you have had. Any medical conditions you have. Whether you are pregnant or may be pregnant. What are the risks? Generally, this is a safe procedure. However, problems may occur, including: Infection. Bleeding. Symptoms that get worse. Damage to the area around your knee. Allergic   reaction to any of the medicines. Skin reactions from repeated injections. What happens before the procedure? Ask your health care provider about: Changing or stopping your regular medicines. This is especially important if you are taking diabetes medicines or blood thinners. Taking medicines such as aspirin and ibuprofen. These medicines can thin your blood. Do not take these medicines unless your health care provider tells you to take them. Taking over-the-counter medicines, vitamins, herbs, and supplements. Plan to have a responsible adult take you home from the hospital or clinic. What happens during the procedure?  You will sit or lie down in a position for your knee to be treated. The skin over your kneecap will be cleaned with a germ-killing soap. You will be given a medicine that numbs the area (local anesthetic). You may feel some stinging. The medicine will be injected into your knee. The needle is carefully placed between your kneecap and your knee. The medicine is injected into the joint space. The needle will be removed at the end of the  procedure. A bandage (dressing) may be placed over the injection site. The procedure may vary among health care providers and hospitals. What can I expect after the procedure? Your blood pressure, heart rate, breathing rate, and blood oxygen level will be monitored until you leave the hospital or clinic. You may have to move your knee through its full range of motion. This helps to get all the medicine into your joint space. You will be watched to make sure that you do not have a reaction to the injected medicine. You may feel more pain, swelling, and warmth than you did before the injection. This reaction may last about 1-2 days. Follow these instructions at home: Medicines Take over-the-counter and prescription medicines only as told by your health care provider. Ask your health care provider if the medicine prescribed to you requires you to avoid driving or using machinery. Do not take medicines such as aspirin and ibuprofen unless your health care provider tells you to take them. Injection site care Follow instructions from your health care provider about: How to take care of your puncture site. When and how you should change your dressing. When you should remove your dressing. Check your injection area every day for signs of infection. Check for: More redness, swelling, or pain after 2 days. Fluid or blood. Pus or a bad smell. Warmth. Managing pain, stiffness, and swelling  If directed, put ice on the injection area. To do this: Put ice in a plastic bag. Place a towel between your skin and the bag. Leave the ice on for 20 minutes, 2-3 times per day. Remove the ice if your skin turns bright red. This is very important. If you cannot feel pain, heat, or cold, you have a greater risk of damage to the area. Do not apply heat to your knee. Raise (elevate) the injection area above the level of your heart while you are sitting or lying down. General instructions If you were given a  dressing, keep it dry until your health care provider says it can be removed. Ask your health care provider when you can start showering or bathing. Avoid strenuous activities for as long as directed by your health care provider. Ask your health care provider when you can return to your normal activities. Keep all follow-up visits. This is important. You may need more injections. Contact a health care provider if you have: A fever. Warmth in your injection area. Fluid, blood, or pus coming   from your injection site. Symptoms at your injection site that last longer than 2 days after your procedure. Get help right away if: Your knee turns very red. Your knee becomes very swollen. Your knee is in severe pain. Summary A knee injection is a procedure to get medicine into your knee joint to relieve the pain, swelling, and stiffness of arthritis. A needle is carefully placed between your kneecap and your knee to inject medicine into the joint space. Before the procedure, ask your health care provider about changing or stopping your regular medicines, especially if you are taking diabetes medicines or blood thinners. Contact your health care provider if you have any problems or questions after your procedure. This information is not intended to replace advice given to you by your health care provider. Make sure you discuss any questions you have with your health care provider. Document Revised: 09/11/2019 Document Reviewed: 09/11/2019 Elsevier Patient Education  2023 [employer]  

## 2022-07-14 ENCOUNTER — Encounter: Payer: Medicare HMO | Admitting: Student in an Organized Health Care Education/Training Program

## 2022-07-21 ENCOUNTER — Ambulatory Visit: Payer: Medicare HMO | Admitting: Gastroenterology

## 2022-07-22 ENCOUNTER — Other Ambulatory Visit: Payer: Self-pay | Admitting: Student in an Organized Health Care Education/Training Program

## 2022-07-22 DIAGNOSIS — G8929 Other chronic pain: Secondary | ICD-10-CM

## 2022-07-22 DIAGNOSIS — G894 Chronic pain syndrome: Secondary | ICD-10-CM

## 2022-08-12 ENCOUNTER — Other Ambulatory Visit: Payer: Self-pay | Admitting: Student in an Organized Health Care Education/Training Program

## 2022-08-12 DIAGNOSIS — G894 Chronic pain syndrome: Secondary | ICD-10-CM

## 2022-08-12 DIAGNOSIS — M170 Bilateral primary osteoarthritis of knee: Secondary | ICD-10-CM

## 2022-08-15 ENCOUNTER — Telehealth: Payer: Self-pay | Admitting: Pharmacist

## 2022-08-15 ENCOUNTER — Ambulatory Visit: Payer: Medicare HMO | Admitting: Pharmacist

## 2022-08-15 DIAGNOSIS — E785 Hyperlipidemia, unspecified: Secondary | ICD-10-CM

## 2022-08-15 DIAGNOSIS — I10 Essential (primary) hypertension: Secondary | ICD-10-CM

## 2022-08-15 NOTE — Patient Instructions (Signed)
Goals Addressed             This Visit's Progress    Pharmacy Goals       Please start using a weekly pillbox to organize your medications.  Please check your home blood pressure, keep a log of the results and bring this with you to your medical appointments.  Estelle Grumbles, PharmD, [facility] Clinical Pharmacist [facility] [phone]

## 2022-08-15 NOTE — Progress Notes (Signed)
   08/15/2022  Patient ID: Pamela Romero, female   DOB: [birth year], 72 y.o.   MRN: [id number]  Outreach to patient today regarding cholesterol and hypertension medication adherence as requested by patient's health plan.   From review of dispensing history in chart, note patient last had lisinopril-HCTZ 20-12.5 mg Rx filled on 05/24/2022 for 90 day supply.  Recalls last checked home blood pressure on 5/4, reading 118/82   From review of dispensing history in chart, note patient last had atorvastatin 10 mg Rx filled on 10/15/2021 for 90 day supply. Note latest prescription is out of refills - Reports feels like she has fallen off with taken this medication as had not noticed that she was out. Reports has been distracted by stressors at home   Plan:  - Will collaborate with PCP to request provider send 90 day supply renewal of atorvastatin prescription to pharmacy for patient - Encourage patient to start using weekly pillbox as adherence aid - Encourage patient to continue to check home blood pressure, keep log of the results and contact office if needed for readings outside of established parameters or symptoms - Patient reports stressors in home life. Offer referral to Social Work team, but patient declines. States she feels safe and is going to work with someone at her church, but will let us know if needing further support - Will follow up with patient by telephone on 09/12/2022 at 11:30 AM   Estelle Grumbles, PharmD, [facility] Clinical Pharmacist [facility] [phone]

## 2022-08-15 NOTE — Telephone Encounter (Signed)
Patient currently out of atorvastatin and prescription is out of refills. Would you please consider sending 90 day supply renewal of her appointment to [facility] for her?  Estelle Grumbles, PharmD, [facility] Clinical Pharmacist [facility] [phone]

## 2022-08-16 MED ORDER — ATORVASTATIN CALCIUM 10 MG PO TABS: 10.0000 mg | ORAL_TABLET | Freq: Every day | ORAL | 1 refills | Status: DC

## 2022-08-16 NOTE — Telephone Encounter (Signed)
Refill sent.

## 2022-08-29 ENCOUNTER — Encounter: Payer: Self-pay | Admitting: Student in an Organized Health Care Education/Training Program

## 2022-08-29 ENCOUNTER — Ambulatory Visit
Payer: Medicare HMO | Attending: Student in an Organized Health Care Education/Training Program | Admitting: Student in an Organized Health Care Education/Training Program

## 2022-08-29 VITALS — BP 133/90 | HR 96 | Temp 96.8°F | Ht 68.0 in | Wt 198.0 lb

## 2022-08-29 DIAGNOSIS — M170 Bilateral primary osteoarthritis of knee: Secondary | ICD-10-CM

## 2022-08-29 MED ORDER — TRIAMCINOLONE ACETONIDE 32 MG IX SRER
32.0000 mg | Freq: Once | INTRA_ARTICULAR | Status: AC
  Administered 2022-08-29: 32 mg via INTRA_ARTICULAR
  Filled 2022-08-29: qty 5

## 2022-08-29 MED ORDER — LIDOCAINE HCL (PF) 2 % IJ SOLN
5.0000 mL | Freq: Once | INTRAMUSCULAR | Status: AC
  Administered 2022-08-29: 5 mL
  Filled 2022-08-29: qty 5

## 2022-08-29 NOTE — Patient Instructions (Signed)
.  [id number]

## 2022-08-29 NOTE — Progress Notes (Signed)
PROVIDER NOTE: Interpretation of information contained herein should be left to medically-trained personnel. Specific patient instructions are provided elsewhere under "Patient Instructions" section of medical record. This document was created in part using STT-dictation technology, any transcriptional errors that may result from this process are unintentional.  Patient: Pamela Romero Type: Established DOB: Mar 13, 1951 MRN: [id number] PCP: Lorre Munroe, NP  Service: Procedure DOS: 08/29/2022 Setting: Ambulatory Location: Ambulatory outpatient facility Delivery: Face-to-face Provider: Edward Jolly, MD Specialty: Interventional Pain Management Specialty designation: 09 Location: Outpatient facility Ref. Prov.: Lorre Munroe, NP   Procedure [facility] Interventional Pain Management )    Procedure: Steroid Knee Injection (LONG ACTING Kennalog)  Laterality: Bilateral (-50) Level: Intra-articular  No.: R5 L6 Series: n/a Purpose: Therapeutic Indications: Knee arthralgia associated to osteoarthritis of the knee   Imaging: None required (CPT-20610) Analgesia: Skin infiltration w/ local anesthetics Sedation: None  NAS-11 score:   Pre-procedure: 6 /10   Post-procedure: 6 /10      1. Bilateral primary osteoarthritis of knee    Pre-Procedure Preparation  Monitoring: As per clinic protocol.  Risk Assessment: Vitals:  WUJ:[id number] body mass index is 30.11 kg/m as calculated from the following:   Height as of this encounter: 5\' 8"  (1.727 m).   Weight as of this encounter: 198 lb (89.8 kg)., Rate:96 , BP:(!) 133/90, Resp: , Temp:(!) 96.8 F (36 C), SpO2:100 %  Allergies: She has No Known Allergies.  Precautions: None required  Blood-thinner(s): None at this time  Coagulopathies: Reviewed. None identified.   Active Infection(s): Reviewed. None identified. Pamela Romero is afebrile   Location setting: Exam room Position: Sitting w/ knee bent 90 degrees Safety Precautions: Patient was  assessed for positional comfort and pressure points before starting the procedure. Prepping solution: DuraPrep (Iodine Povacrylex [0.7% available iodine] and Isopropyl Alcohol, 74% w/w) Prep Area: Entire knee region Approach: percutaneous, just above the tibial plateau, medial to the infrapatellar tendon. Intended target: Intra-articular knee space Materials: Tray: Block Needle(s): Regular Qty: 1/side Length: 1.5-inch Gauge: 25G   Meds ordered this encounter  Medications   Triamcinolone Acetonide (ZILRETTA) intra-articular injection 32 mg    Maintain refrigerated.  Prepared suspension may be stored up to 4 hours at ambient conditions.   Triamcinolone Acetonide (ZILRETTA) intra-articular injection 32 mg    Maintain refrigerated.  Prepared suspension may be stored up to 4 hours at ambient conditions.   lidocaine HCl (PF) (XYLOCAINE) 2 % injection 5 mL     No orders of the defined types were placed in this encounter.     Time-out: 1012 I initiated and conducted the "Time-out" before starting the procedure, as per protocol. The patient was asked to participate by confirming the accuracy of the "Time Out" information. Verification of the correct person, site, and procedure were performed and confirmed by me, the nursing staff, and the patient. "Time-out" conducted as per Joint Commission's Universal Protocol (UP.01.01.01). Procedure checklist: Completed   H&P (Pre-op  Assessment)  Pamela Romero is a 72 y.o. (year old), female patient, seen today for interventional treatment. She  has a past surgical history that includes Thyroidectomy (2005); Cardiac catheterization (2006); Total abdominal hysterectomy; Breast surgery (Right, March 2014); Breast cyst aspiration (Right, 2014); and Esophagogastroduodenoscopy (N/A, 04/07/2020). Pamela Romero has a current medication list which includes the following prescription(s): albuterol, aspirin ec, atorvastatin, docusate sodium, esomeprazole, gabapentin,  hydrocodone-acetaminophen, [START ON 09/21/2022] hydrocodone-acetaminophen, levothyroxine, lisinopril-hydrochlorothiazide, ondansetron, polyethylene glycol powder, rizatriptan, famotidine, and sertraline. Her primarily concern today is the Knee Pain (bilateral)   She  has No Known Allergies.   Last encounter: My last encounter with her was on 11/03/2020. Pertinent problems: Pamela Romero has Bilateral primary osteoarthritis of knee and Osteoporosis on their pertinent problem list. Pain Assessment: Severity of Chronic pain is reported as a 6 /10. Location: Knee Right, Left/radiates to top of feet. Onset: More than a month ago. Quality: Constant, Throbbing. Timing: Constant. Modifying factor(s): meds, injections. Vitals:  height is 5\' 8"  (1.727 m) and weight is 198 lb (89.8 kg). Her temporal temperature is 96.8 F (36 C) (abnormal). Her blood pressure is 133/90 (abnormal) and her pulse is 96. Her oxygen saturation is 100%.   Reason for encounter: "interventional pain management therapy due pain of at least four (4) weeks in duration, with failure to respond and/or inability to tolerate more conservative care.     Related imaging: Knee-L MR wo contrast:  Results for orders placed during the hospital encounter of 09/01/17  MR KNEE LEFT WO CONTRAST  Narrative CLINICAL DATA:  Persistent anterior knee pain for 1.5 months.  EXAM: MRI OF THE LEFT KNEE WITHOUT CONTRAST  TECHNIQUE: Multiplanar, multisequence MR imaging of the knee was performed. No intravenous contrast was administered.  COMPARISON:  None.  FINDINGS: MENISCI  Medial meniscus: There is an extensive horizontal tear posterior horn extending from the superior surface to the periphery. The midbody is degenerated and peripherally subluxed.  Lateral meniscus: Extensive horizontal tear involving the anterior horn and midbody with small parameniscal cysts at the level of the midbody best seen on image 34 of series 7 and series 31 of  series 5.  LIGAMENTS  Cruciates:  Intact.  Collaterals:  Intact.  CARTILAGE  Patellofemoral: Partial-thickness cartilage loss of the superior aspect of the medial facet of the patella.  Medial: Denuding of the articular cartilage of the medial tibial plateau with subcortical edema and subcortical cyst formation in the periphery of the tibial plateau. Thinning of the articular cartilage of the femoral condyle.  Lateral: Focal full-thickness cartilage loss in the posterior aspect of the lateral tibial plateau.  Joint: Trace joint effusion. Normal Hoffa's fat pad. No plical thickening.  Popliteal Fossa:  Small Baker's cyst.  Intact popliteus tendon.  Extensor Mechanism:  Normal.  Bones: Moderate marginal osteophytes in the medial compartment. Tiny marginal osteophytes in the lateral compartment.  Other: None  IMPRESSION: 1. Extensive horizontal tear of the posterior horn of the medial meniscus. 2. Extensive horizontal tear of the anterior horn and midbody of the lateral meniscus. 3. Moderate osteoarthritis of the medial compartment with full-thickness cartilage loss and subcortical edema and subcortical cyst formation in the medial tibial plateau.   Electronically Signed By: Francene Boyers M.D. On: 09/01/2017 10:59 \    Site Confirmation: Pamela Romero was asked to confirm the procedure and laterality before marking the site.  Consent: Before the procedure and under the influence of no sedative(s), amnesic(s), or anxiolytics, the patient was informed of the treatment options, risks and possible complications. To fulfill our ethical and legal obligations, as recommended by the American Medical Association's Code of Ethics, I have informed the patient of my clinical impression; the nature and purpose of the treatment or procedure; the risks, benefits, and possible complications of the intervention; the alternatives, including doing nothing; the risk(s) and benefit(s) of  the alternative treatment(s) or procedure(s); and the risk(s) and benefit(s) of doing nothing. The patient was provided information about the general risks and possible complications associated with the procedure. These may include, but are not limited to: failure  to achieve desired goals, infection, bleeding, organ or nerve damage, allergic reactions, paralysis, and death. In addition, the patient was informed of those risks and complications associated to Spine-related procedures, such as failure to decrease pain; infection (i.e.: Meningitis, epidural or intraspinal abscess); bleeding (i.e.: epidural hematoma, subarachnoid hemorrhage, or any other type of intraspinal or peri-dural bleeding); organ or nerve damage (i.e.: Any type of peripheral nerve, nerve root, or spinal cord injury) with subsequent damage to sensory, motor, and/or autonomic systems, resulting in permanent pain, numbness, and/or weakness of one or several areas of the body; allergic reactions; (i.e.: anaphylactic reaction); and/or death. Furthermore, the patient was informed of those risks and complications associated with the medications. These include, but are not limited to: allergic reactions (i.e.: anaphylactic or anaphylactoid reaction(s)); adrenal axis suppression; blood sugar elevation that in diabetics may result in ketoacidosis or comma; water retention that in patients with history of congestive heart failure may result in shortness of breath, pulmonary edema, and decompensation with resultant heart failure; weight gain; swelling or edema; medication-induced neural toxicity; particulate matter embolism and blood vessel occlusion with resultant organ, and/or nervous system infarction; and/or aseptic necrosis of one or more joints. Finally, the patient was informed that Medicine is not an exact science; therefore, there is also the possibility of unforeseen or unpredictable risks and/or possible complications that may result in a  catastrophic outcome. The patient indicated having understood very clearly. We have given the patient no guarantees and we have made no promises. Enough time was given to the patient to ask questions, all of which were answered to the patient's satisfaction. Pamela Romero has indicated that she wanted to continue with the procedure. Attestation: I, the ordering provider, attest that I have discussed with the patient the benefits, risks, side-effects, alternatives, likelihood of achieving goals, and potential problems during recovery for the procedure that I have provided informed consent.  Date  Time: 08/29/2022  9:37 AM   Prophylactic antibiotics  Anti-infectives (From admission, onward)    None      Indication(s): None identified   Description of procedure   Start Time: 1012 hrs  Local Anesthesia: Once the patient was positioned, prepped, and time-out was completed. The target area was identified located. The skin was marked with an approved surgical skin marker. Once marked, the skin (epidermis, dermis, and hypodermis), and deeper tissues (fat, connective tissue and muscle) were infiltrated with a small amount of a short-acting local anesthetic, loaded on a 10cc syringe with a 25G, 1.5-in  Needle. An appropriate amount of time was allowed for local anesthetics to take effect before proceeding to the next step. Local Anesthetic: Lidocaine 1-2% The unused portion of the local anesthetic was discarded in the proper designated containers. Safety Precautions: Aspiration looking for blood return was conducted prior to all injections. At no point did I inject any substances, as a needle was being advanced. Before injecting, the patient was told to immediately notify me if she was experiencing any new onset of "ringing in the ears, or metallic taste in the mouth". No attempts were made at seeking any paresthesias. Safe injection practices and needle disposal techniques used. Medications properly  checked for expiration dates. SDV (single dose vial) medications used. After the completion of the procedure, all disposable equipment used was discarded in the proper designated medical waste containers.  Technical description: Protocol guidelines were followed. After positioning, the target area was identified and prepped in the usual manner. Skin & deeper tissues infiltrated with local anesthetic. Appropriate  amount of time allowed to pass for local anesthetics to take effect. Proper needle placement secured. Once satisfactory needle placement was confirmed, I proceeded to inject the desired solution in slow, incremental fashion, intermittently assessing for discomfort or any signs of abnormal or undesired spread of substance. Once completed, the needle was removed and disposed of, as per hospital protocols. The area was cleaned, making sure to leave some of the prepping solution back to take advantage of its long term bactericidal properties.  Aspiration:  Negative   Vitals:   08/29/22 0946  BP: (!) 133/90  Pulse: 96  Temp: (!) 96.8 F (36 C)  TempSrc: Temporal  SpO2: 100%  Weight: 198 lb (89.8 kg)  Height: 5\' 8"  (1.727 m)      End Time: 1016 hrs     Post-op assessment  Post-procedure Vital Signs:  Pulse/HCG Rate: 96  Temp: (!) 96.8 F (36 C)  Resp:   BP: (!) 133/90 SpO2: 100 %  EBL: None  Complications: No immediate post-treatment complications observed by team, or reported by patient.  Note: The patient tolerated the entire procedure well. A repeat set of vitals were taken after the procedure and the patient was kept under observation following institutional policy, for this type of procedure. Post-procedural neurological assessment was performed, showing return to baseline, prior to discharge. The patient was provided with post-procedure discharge instructions, including a section on how to identify potential problems. Should any problems arise concerning this procedure,  the patient was given instructions to immediately contact us, at any time, without hesitation. In any case, we plan to contact the patient by telephone for a follow-up status report regarding this interventional procedure.  Comments:  No additional relevant information.   Plan of care  Chronic Opioid Analgesic:  Hydrocodone 7.5 mg 3 times daily as needed with an extra quantity 15/month (#105)For severe breakthrough pain    Medications administered: We administered Triamcinolone Acetonide, Triamcinolone Acetonide, and lidocaine HCl (PF).  5 cc of ER triamcinolone injected in each knee.  Follow-up plan:   Return for Keep sch. appt.     Recent Visits Date Type Provider Dept  07/12/22 Office Visit Edward Jolly, MD [facility]  Showing recent visits within past 90 days and meeting all other requirements Today's Visits Date Type Provider Dept  08/29/22 Procedure visit Edward Jolly, MD [facility]  Showing today's visits and meeting all other requirements Future Appointments Date Type Provider Dept  10/11/22 Appointment Edward Jolly, MD [facility]  Showing future appointments within next 90 days and meeting all other requirements   Disposition: Discharge home  Discharge (Date  Time): 08/29/2022; 1020 hrs.   Primary Care Physician: Lorre Munroe, NP Location: [facility] Note by: Edward Jolly, MD Date: 08/29/2022; Time: 10:28 AM  DISCLAIMER: Medicine is not an exact science. It has no guarantees or warranties. The decision to proceed with this intervention was based on the information collected from the patient. Conclusions were drawn from the patient's questionnaire, interview, and examination. Because information was provided in large part by the patient, it cannot be guaranteed that it has not been purposely or unconsciously manipulated or altered. Every effort has been made to obtain as much accurate, relevant, available  data as possible. Always take into account that the treatment will also be dependent on availability of resources and existing treatment guidelines, considered by other Pain Management Specialists as being common knowledge and practice, at the time of the intervention. It is  also important to point out that variation in procedural techniques and pharmacological choices are the acceptable norm. For Medico-Legal review purposes, the indications, contraindications, technique, and results of the these procedures should only be evaluated, judged and interpreted by a Board-Certified Interventional Pain Specialist with extensive familiarity and expertise in the same exact procedure and technique.

## 2022-08-30 ENCOUNTER — Telehealth: Payer: Self-pay

## 2022-08-30 NOTE — Telephone Encounter (Signed)
Post procedure follow up.  Patient states she is doing well.   ?

## 2022-08-31 NOTE — Progress Notes (Signed)
Celso Amy, PA-C [address]  [address]  [place], [place] [address]  Main: [phone]  Fax: [phone]   Gastroenterology Consultation  Referring Provider:     Lorre Munroe, NP Primary Care Physician:  Lorre Munroe, NP Primary Gastroenterologist:  Dr. Wyline Mood / Celso Amy, PA-C  Reason for Consultation:     Nausea and GERD        HPI:   Pamela Romero is a 72 y.o. y/o female referred for consultation & management  by Lorre Munroe, NP.  She is having multiple GI symptoms.  He reports chronic worsening nausea and vomiting episodes.  Has abdominal pain located in the epigastrium, left upper quadrant, and bilateral lower abdomen.  History o acid reflux for many years.  Takes Nexium 40 twice daily and famotidine 20 Mg once daily which is not controlling her reflux.  Denies dysphagia.  She has had a 30 pound unintentional weight loss in the past few months.  Decreased appetite.  She has chronic constipation for many years.  Is on chronic opioid oxycodone for chronic pain.  Tried OTC Senokot, stool softener, and MiraLAX with little benefit.  Senokot caused nausea.  She denies rectal bleeding.  EGD done by Dr. Tobi Bastos 03/2020 showed mild Schatzki's ring at the GE junction which was dilated.  Food at the GE junction removed.  Normal stomach and duodenum.  She has never had a colonoscopy.  She tried to do colonoscopy in the past, however she vomited the prep and was unable to tolerate colonoscopy prep.  Her brother had colon cancer at age 64.  She was given a Cologuard test by her PCP, not completed.  Labs 03/2022 showed anemia with a hemoglobin 10.3.  She is not on iron or B12.  Is on levothyroxine for hypothyroidism.  Past Medical History:  Diagnosis Date   Abnormal EKG    CAD (coronary artery disease)    non critical, by cardiac cath 2006   Cancer [facility])    endometrial Ca   COPD (chronic obstructive pulmonary disease) (HCC)    Endometrial adenocarcinoma  (HCC) 2011   Hyperlipidemia    Hypertension    Osteoarthritis    Right foot, Left knee (Dr. Joice Lofts)   Reflux gastritis    Thyroid disease    tobacco abuse    Vaginal delivery    x 2    Past Surgical History:  Procedure Laterality Date   BREAST CYST ASPIRATION Right 2014   neg   BREAST SURGERY Right March 2014   benign [place]   CARDIAC CATHETERIZATION  2006   40% LAD, EF 60%   ESOPHAGOGASTRODUODENOSCOPY N/A 04/07/2020   Procedure: ESOPHAGOGASTRODUODENOSCOPY (EGD);  Surgeon: Wyline Mood, MD;  Location: [facility] ENDOSCOPY;  Service: Gastroenterology;  Laterality: N/A;   THYROIDECTOMY  2005   partial at [facility], non malignant tumor   TOTAL ABDOMINAL HYSTERECTOMY     with BSO    Prior to Admission medications   Medication Sig Start Date End Date Taking? Authorizing Provider  albuterol (PROAIR HFA) 108 (90 Base) MCG/ACT inhaler Inhale 1-2 puffs into the lungs every 6 (six) hours as needed for wheezing or shortness of breath. 04/14/22   Lorre Munroe, NP  aspirin EC 81 MG tablet Take 1 tablet (81 mg total) by mouth daily. Swallow whole. 10/05/21   Lorre Munroe, NP  atorvastatin (LIPITOR) 10 MG tablet Take 1 tablet (10 mg total) by mouth daily. 08/16/22   Lorre Munroe, NP  docusate sodium (COLACE) 100 MG capsule Take 1 capsule (100 mg total) by mouth at bedtime as needed for mild constipation. 03/09/22   Lorre Munroe, NP  esomeprazole (NEXIUM) 40 MG capsule Take 1 capsule (40 mg total) by mouth 2 (two) times daily before a meal. 09/15/21   Baity, Salvadore Oxford, NP  famotidine (PEPCID) 20 MG tablet Take 1 tablet (20 mg total) by mouth at bedtime. Patient not taking: Reported on 08/15/2022 12/09/21   Lorre Munroe, NP  gabapentin (NEURONTIN) 300 MG capsule Take 1-2 capsules (300-600 mg total) by mouth at bedtime. 07/12/22 04/08/23  Edward Jolly, MD  HYDROcodone-acetaminophen (NORCO) 7.5-325 MG tablet Take 1 tablet by mouth every 6 (six) hours as needed for severe pain. Must last 30 days. 08/22/22  09/21/22  Edward Jolly, MD  HYDROcodone-acetaminophen (NORCO) 7.5-325 MG tablet Take 1 tablet by mouth every 6 (six) hours as needed for severe pain. Must last 30 days. 09/21/22 10/21/22  Edward Jolly, MD  levothyroxine (SYNTHROID) 125 MCG tablet Take 1 tablet (125 mcg total) by mouth daily. 04/13/22   Lorre Munroe, NP  lisinopril-hydrochlorothiazide (ZESTORETIC) 20-12.5 MG tablet TAKE 1 TABLET BY MOUTH ONCE DAILY 05/24/22   Lorre Munroe, NP  ondansetron (ZOFRAN-ODT) 4 MG disintegrating tablet Take 1 tablet (4 mg total) by mouth daily as needed for nausea or vomiting. 03/09/22   Lorre Munroe, NP  polyethylene glycol powder (GLYCOLAX/MIRALAX) 17 GM/SCOOP powder Take 17 g by mouth daily. 03/09/22   Lorre Munroe, NP  rizatriptan (MAXALT-MLT) 10 MG disintegrating tablet Take 1 tablet (10 mg total) by mouth as needed for migraine. May repeat in 2 hours if needed 10/26/21   Smitty Cords, DO  sertraline (ZOLOFT) 50 MG tablet Take 1/2 tab PO x 10 days then increase to 1 tab daily thereafter Patient not taking: Reported on 08/15/2022 09/15/21   Lorre Munroe, NP    Family History  Problem Relation Age of Onset   Alcohol abuse Father    Cancer Maternal Aunt        stomach   Alcohol abuse Maternal Aunt    Alcohol abuse Mother    Heart disease Brother        MI age 48- non smoker   Cancer Brother        Pancreatic   Cancer Brother        colon, dx'd age 79   Cancer Brother        stomach, paternal half brother   Breast cancer Neg Hx      Social History   Tobacco Use   Smoking status: Former    Packs/day: 0.50    Years: 50.00    Additional pack years: 0.00    Total pack years: 25.00    Types: Cigarettes    Quit date: 05/11/2018    Years since quitting: 4.3   Smokeless tobacco: Former  Building services engineer Use: Never used  Substance Use Topics   Alcohol use: No   Drug use: No    Allergies as of 09/01/2022   (No Known Allergies)    Review of Systems:    All  systems reviewed and negative except where noted in HPI.   Physical Exam:  There were no vitals taken for this visit. No LMP recorded. Patient has had a hysterectomy. Psych:  Alert and cooperative. Normal mood and affect. General:   Alert,  Well-developed, well-nourished, pleasant and cooperative in NAD Head:  Normocephalic and atraumatic. Eyes:  Sclera clear, no icterus.   Conjunctiva pink. Neck:  Supple; no masses or thyromegaly. Lungs:  Respirations even and unlabored.  Clear throughout to auscultation.   No wheezes, crackles, or rhonchi. No acute distress. Heart:  Regular rate and rhythm; no murmurs, clicks, rubs, or gallops. Abdomen:  Normal bowel sounds.  No bruits.  Soft, and obese without masses, hepatosplenomegaly or hernias noted.  There is moderate epigastric, LUQ, LLQ and RLQ Tenderness.  No guarding or rebound tenderness.    Neurologic:  Alert and oriented x3;  grossly normal neurologically. Psych:  Alert and cooperative. Normal mood and affect.  Imaging Studies: No results found.  Assessment and Plan:   Pamela Romero is a 72 y.o. y/o female has been referred for multiple GI symptoms.  Primarily she is having nausea, vomiting, epigastric pain, and weight loss.  I am ordering lab work, EGD, and abdominal pelvic CT to begin her evaluation.  She has chronic opioid-induced constipation.  Starting treatment with Movantik.  We discussed scheduling a colonoscopy, however patient adamantly declined.  She states she cannot tolerate colonoscopy prep due to nausea and vomiting.  I will follow-up with her after above testing and we can decide about colonoscopy at a later date.  Nausea & Vomiting - chronic, worsening  Scheduling repeat EGD with possible dilation. I discussed risks of EGD with patient to include risk of bleeding, perforation, and risk of sedation.   Patient expressed understanding and agrees to proceed with EGD.   Epigastric and LUQ abdominal pain -new onset  Labs:  CMP, lipase, CBC  Abdominal pelvic CT with contrast  GERD - not controlled on high-dose PPI plus H2 RB  Continue pantoprazole 40 Mg twice daily and famotidine 20 Mg daily.  Recommend Lifestyle Modifications to prevent Acid Reflux.  Rec. Avoid coffee, sodas, peppermint, citrus fruits, and spicey foods.  Avoid eating 2-3 hours before bedtime.   History of Schatzki's ring - EGD 03/2020 by Dr. Tobi Bastos (food bolus removed)  Scheduling repeat EGD with possible dilation.  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  Unintentional weight loss -30 pounds in the past year  Lab: TSH; currently on levothyroxine  Abdominal pelvic CT with contrast.  Anemia  Labs: CBC, iron, ferritin, B12, folate  Colon cancer screening -no previous colonoscopy.  Patient could not tolerate colonoscopy prep due to nausea vomiting.  We will try to get her scheduled for colonoscopy with a small volume prep (Sutab, Plenvu, or Clenpiq).  We will discuss colonoscopy again at her next follow-up office visit after she has had a CT scan.  Family history of colon cancer -brother age 49  We will try to get her scheduled for colonoscopy with a small volume prep (Sutab, Plenvu, or Clenpiq).  We will discuss colonoscopy again at her next follow-up office visit after she has had a CT scan.   Follow up in 4 weeks after EGD with TG.  Also follow-up based on lab and CT results.  Celso Amy, PA-C

## 2022-09-01 ENCOUNTER — Other Ambulatory Visit: Payer: Self-pay

## 2022-09-01 ENCOUNTER — Encounter: Payer: Self-pay | Admitting: Physician Assistant

## 2022-09-01 ENCOUNTER — Ambulatory Visit: Payer: Medicare HMO | Admitting: Physician Assistant

## 2022-09-01 ENCOUNTER — Telehealth: Payer: Self-pay

## 2022-09-01 VITALS — BP 131/87 | HR 92 | Temp 98.3°F | Ht 68.5 in | Wt 206.0 lb

## 2022-09-01 DIAGNOSIS — K219 Gastro-esophageal reflux disease without esophagitis: Secondary | ICD-10-CM | POA: Diagnosis not present

## 2022-09-01 DIAGNOSIS — R1084 Generalized abdominal pain: Secondary | ICD-10-CM

## 2022-09-01 DIAGNOSIS — T402X5A Adverse effect of other opioids, initial encounter: Secondary | ICD-10-CM

## 2022-09-01 DIAGNOSIS — D649 Anemia, unspecified: Secondary | ICD-10-CM

## 2022-09-01 DIAGNOSIS — Z1211 Encounter for screening for malignant neoplasm of colon: Secondary | ICD-10-CM | POA: Diagnosis not present

## 2022-09-01 DIAGNOSIS — R112 Nausea with vomiting, unspecified: Secondary | ICD-10-CM | POA: Diagnosis not present

## 2022-09-01 DIAGNOSIS — R634 Abnormal weight loss: Secondary | ICD-10-CM | POA: Diagnosis not present

## 2022-09-01 DIAGNOSIS — R1013 Epigastric pain: Secondary | ICD-10-CM

## 2022-09-01 DIAGNOSIS — K5903 Drug induced constipation: Secondary | ICD-10-CM | POA: Diagnosis not present

## 2022-09-01 MED ORDER — NALOXEGOL OXALATE 12.5 MG PO TABS
12.5000 mg | ORAL_TABLET | Freq: Every day | ORAL | Status: DC
Start: 2022-09-01 — End: 2022-11-22

## 2022-09-01 NOTE — Patient Instructions (Signed)
CT abdomen/pelvis with contrast scheduled 09/06/22 @ 3:15 am at [facility] mall entrance. Nothing to eat/drink 4 hours prior.

## 2022-09-01 NOTE — Telephone Encounter (Signed)
Patient CT scan is schedule for 09/06/2022 and is schedule for out patient imaging. Insurance will not approve her to go to out patient imaging but has approved her to go to [facility]. Please move her to [facility].

## 2022-09-02 ENCOUNTER — Telehealth: Payer: Self-pay

## 2022-09-02 LAB — IRON,TIBC AND FERRITIN PANEL
Ferritin: 20 ng/mL (ref 15–150)
Iron Saturation: 16 % (ref 15–55)
Iron: 51 ug/dL (ref 27–139)
Total Iron Binding Capacity: 316 ug/dL (ref 250–450)
UIBC: 265 ug/dL (ref 118–369)

## 2022-09-02 LAB — CBC WITH DIFFERENTIAL
Basophils Absolute: 0.1 10*3/uL (ref 0.0–0.2)
Basos: 1 %
EOS (ABSOLUTE): 0.1 10*3/uL (ref 0.0–0.4)
Eos: 1 %
Hematocrit: 31.4 % — ABNORMAL LOW (ref 34.0–46.6)
Hemoglobin: 10.1 g/dL — ABNORMAL LOW (ref 11.1–15.9)
Immature Grans (Abs): 0 10*3/uL (ref 0.0–0.1)
Immature Granulocytes: 0 %
Lymphocytes Absolute: 1.7 10*3/uL (ref 0.7–3.1)
Lymphs: 25 %
MCH: 30.1 pg (ref 26.6–33.0)
MCHC: 32.2 g/dL (ref 31.5–35.7)
MCV: 94 fL (ref 79–97)
Monocytes Absolute: 0.5 10*3/uL (ref 0.1–0.9)
Monocytes: 8 %
Neutrophils Absolute: 4.4 10*3/uL (ref 1.4–7.0)
Neutrophils: 65 %
RBC: 3.35 x10E6/uL — ABNORMAL LOW (ref 3.77–5.28)
RDW: 13.3 % (ref 11.7–15.4)
WBC: 6.8 10*3/uL (ref 3.4–10.8)

## 2022-09-02 LAB — TSH: TSH: 2.77 u[IU]/mL (ref 0.450–4.500)

## 2022-09-02 LAB — B12 AND FOLATE PANEL
Folate: 2.3 ng/mL — ABNORMAL LOW (ref >3.0)
Vitamin B-12: 254 pg/mL (ref 232–1245)

## 2022-09-02 LAB — COMPREHENSIVE METABOLIC PANEL
ALT: 9 IU/L (ref 0–32)
AST: 13 IU/L (ref 0–40)
Albumin/Globulin Ratio: 1.7 (ref 1.2–2.2)
Albumin: 3.8 g/dL — ABNORMAL LOW (ref 3.9–4.9)
Alkaline Phosphatase: 77 IU/L (ref 44–121)
BUN/Creatinine Ratio: 19 (ref 12–28)
BUN: 22 mg/dL (ref 8–27)
Bilirubin Total: <0.2 mg/dL (ref 0.0–1.2)
CO2: 25 mmol/L (ref 20–29)
Calcium: 9.9 mg/dL (ref 8.7–10.3)
Chloride: 104 mmol/L (ref 96–106)
Creatinine, Ser: 1.14 mg/dL — ABNORMAL HIGH (ref 0.57–1.00)
Globulin, Total: 2.3 g/dL (ref 1.5–4.5)
Glucose: 92 mg/dL (ref 70–99)
Potassium: 4.5 mmol/L (ref 3.5–5.2)
Sodium: 142 mmol/L (ref 134–144)
Total Protein: 6.1 g/dL (ref 6.0–8.5)
eGFR: 52 mL/min/{1.73_m2} — ABNORMAL LOW (ref >59)

## 2022-09-02 LAB — LIPASE: Lipase: 13 U/L — ABNORMAL LOW (ref 14–72)

## 2022-09-02 NOTE — Telephone Encounter (Signed)
Tried reaching patient- no answer- unable to leave VM-         Notify patient low stable hemoglobin 10.1, mild anemia.  Iron is Normal.  Folate and Vitamin B12 are Low.  Normal lipase pancreas test. Stable chronic kidney disease improved from labs 1 year ago.  Normal electrolytes.  Normal TSH thyroid test.  **Rec. Start: Rx Folic Acid 1 mg daily, #30, 1 RF and OTC Vitamin B12 - 1000mcg once daily.  **Schedule lab visit in 1 month to repeat CBC, Vitamin B12 and Folate.    

## 2022-09-06 ENCOUNTER — Telehealth: Payer: Self-pay

## 2022-09-06 ENCOUNTER — Ambulatory Visit: Admission: RE | Admit: 2022-09-06 | Payer: Medicare HMO | Source: Ambulatory Visit

## 2022-09-06 DIAGNOSIS — R7989 Other specified abnormal findings of blood chemistry: Secondary | ICD-10-CM

## 2022-09-06 NOTE — Telephone Encounter (Signed)
Tried reaching patient- no answer- unable to leave VM-         Notify patient low stable hemoglobin 10.1, mild anemia.  Iron is Normal.  Folate and Vitamin B12 are Low.  Normal lipase pancreas test. Stable chronic kidney disease improved from labs 1 year ago.  Normal electrolytes.  Normal TSH thyroid test.  **Rec. Start: Rx Folic Acid 1 mg daily, #30, 1 RF and OTC Vitamin B12 - once daily.  **Schedule lab visit in 1 month to repeat CBC, Vitamin B12 and Folate.

## 2022-09-10 ENCOUNTER — Other Ambulatory Visit: Payer: Self-pay | Admitting: Internal Medicine

## 2022-09-10 DIAGNOSIS — E890 Postprocedural hypothyroidism: Secondary | ICD-10-CM

## 2022-09-12 ENCOUNTER — Telehealth: Payer: Self-pay | Admitting: Pharmacist

## 2022-09-12 ENCOUNTER — Telehealth: Payer: Medicare HMO

## 2022-09-12 NOTE — Telephone Encounter (Signed)
Requested Prescriptions  Pending Prescriptions Disp Refills   lisinopril-hydrochlorothiazide (ZESTORETIC) 20-12.5 MG tablet [Pharmacy Med Name: LISINOPRIL-HCTZ 20-12.5 MG TAB] 90 tablet 1    Sig: TAKE 1 TABLET BY MOUTH ONCE DAILY     Cardiovascular:  ACEI + Diuretic Combos Failed - 09/10/2022  1:37 PM      Failed - Cr in normal range and within 180 days    Creat  Date Value Ref Range Status  04/08/2022 1.13 (H) 0.50 - 1.05 mg/dL Final   Creatinine, Ser  Date Value Ref Range Status  09/01/2022 1.14 (H) 0.57 - 1.00 mg/dL Final         Passed - Na in normal range and within 180 days    Sodium  Date Value Ref Range Status  09/01/2022 142 134 - 144 mmol/L Final         Passed - K in normal range and within 180 days    Potassium  Date Value Ref Range Status  09/01/2022 4.5 3.5 - 5.2 mmol/L Final         Passed - eGFR is 30 or above and within 180 days    GFR, Est African American  Date Value Ref Range Status  01/02/2020 61 > OR = 60 mL/min/1.47m2 Final   GFR, Est Non African American  Date Value Ref Range Status  01/02/2020 52 (L) > OR = 60 mL/min/1.48m2 Final   GFR, Estimated  Date Value Ref Range Status  04/07/2020 >60 >60 mL/min Final    Comment:    (NOTE) Calculated using the CKD-EPI Creatinine Equation (2021)    GFR  Date Value Ref Range Status  01/21/2015 60.34 >60.00 mL/min Final   eGFR  Date Value Ref Range Status  09/01/2022 52 (L) >59 mL/min/1.73 Final         Passed - Patient is not pregnant      Passed - Last BP in normal range    BP Readings from Last 1 Encounters:  09/01/22 131/87         Passed - Valid encounter within last 6 months    Recent Outpatient Visits           4 weeks ago Essential hypertension   [place] [facility] [place], Gentry Fitz A, RPH-CPP   5 months ago Chronic obstructive pulmonary disease with acute exacerbation [facility])   [place] [facility] [place], Salvadore Oxford, NP   5 months ago Need for  immunization against influenza   [place] [facility] [place], Salvadore Oxford, NP   6 months ago Nausea and vomiting, unspecified vomiting type   [place] [facility] Lorre Munroe, NP   9 months ago B12 deficiency   [place] [place], Salvadore Oxford, NP       Future Appointments             In 3 weeks Sam[place] Si, Salvadore Oxford, NP [place] [facility]             levothyroxine (SYNTHROID) 125 MCG tablet [Pharmacy Med Name: LEVOTHYROXINE SODIUM 125 MCG TAB] 90 tablet 1    Sig: TAKE 1 TABLET BY MOUTH ONCE DAILY ON AN EMPTY STOMACH. WAIT 30 MINUTES BEFORE TAKING OTHER MEDS.     Endocrinology:  Hypothyroid Agents Passed - 09/10/2022  1:37 PM      Passed - TSH in normal range and within 360 days    TSH  Date Value  Ref Range Status  09/01/2022 2.770 0.450 - 4.500 uIU/mL Final         Passed - Valid encounter within last 12 months    Recent Outpatient Visits           4 weeks ago Essential hypertension   [place] [facility] [place], Gentry Fitz A, RPH-CPP   5 months ago Chronic obstructive pulmonary disease with acute exacerbation [facility])   [facility] [place], Salvadore Oxford, NP   5 months ago Need for immunization against influenza   [place] [facility] [place], [place], NP   6 months ago Nausea and vomiting, unspecified vomiting type   [facility] [place], Salvadore Oxford, NP   9 months ago B12 deficiency   [facility] [place], Salvadore Oxford, NP       Future Appointments             In 3 weeks Sampson Si, Salvadore Oxford, NP [place] [facility], [facility]

## 2022-09-12 NOTE — Telephone Encounter (Signed)
   Outreach Note  09/12/2022 Name: Pamela Romero MRN: [id number] DOB: 07-29-1950  Referred by: Lorre Munroe, NP Reason for referral : No chief complaint on file.   Was unable to reach patient via telephone today and unable to leave a message as no voicemail picks up   Follow Up Plan: Will collaborate with Care Guide to outreach to schedule follow up with me  Estelle Grumbles, PharmD, [facility] Clinical Pharmacist [facility] [phone]

## 2022-09-19 ENCOUNTER — Telehealth: Payer: Self-pay

## 2022-09-19 NOTE — Progress Notes (Signed)
  Care Coordination Note  09/19/2022 Name: Pamela Romero MRN: [id number] DOB: [birth year]  Pamela Romero is a 72 y.o. year old female who is a primary care patient of Lorre Munroe, NP and is actively engaged with the Chronic Care Management team. I reached out to Kinnie Scales by phone today to assist with re-scheduling a follow up visit with the Pharmacist  Follow up plan: Unsuccessful telephone outreach attempt made. A HIPAA compliant phone message was left for the patient providing contact information and requesting a return call.  Unable to make contact on outreach attempts x 2. PCP 7 notified via routed documentation in medical record.  If patient returns call to provider office, please advise to call CCM Care Guide Penne Lash  at [phone]  Penne Lash, RMA Care Guide [facility]  [place], [place] [address] Direct Dial: [phone] [email or web address]

## 2022-09-19 NOTE — Progress Notes (Signed)
error 

## 2022-09-29 ENCOUNTER — Other Ambulatory Visit: Payer: Self-pay | Admitting: Internal Medicine

## 2022-09-29 DIAGNOSIS — Z1231 Encounter for screening mammogram for malignant neoplasm of breast: Secondary | ICD-10-CM

## 2022-10-03 ENCOUNTER — Other Ambulatory Visit: Payer: Medicare HMO

## 2022-10-07 ENCOUNTER — Ambulatory Visit: Payer: Medicare HMO | Admitting: Internal Medicine

## 2022-10-10 ENCOUNTER — Ambulatory Visit: Payer: Self-pay

## 2022-10-10 NOTE — Telephone Encounter (Signed)
Pt is constipated, has questions about OTC meds she is taking. Not feeling well at all. Says she is desperate  Best contact: [phone]        Chief Complaint: Chronic constipation. Husband got Miralax from pharmacy and pt. Will try that today. Has bowel movement x1 week. Stool is soft, soft today.  Symptoms: Low abdominal discomfort Frequency: 2 weeks Pertinent Negatives: Patient denies  Disposition: [] ED /[] Urgent Care (no appt availability in office) / [] Appointment(In office/virtual)/ []  [facility]/ [x] Home Care/ [] Refused Recommended Disposition /[] [facility]/ []  Follow-up with PCP Additional Notes: Will call back if no results.  Reason for Disposition  Over-The-Counter (OTC) medicines for constipation, questions about  Answer Assessment - Initial Assessment Questions 1. STOOL PATTERN OR FREQUENCY: "How often do you have a bowel movement (BM)?"  (Normal range: 3 times a day to every 3 days)  "When was your last BM?"       1 x week 2. STRAINING: "Do you have to strain to have a BM?"      Yes 3. RECTAL PAIN: "Does your rectum hurt when the stool comes out?" If Yes, ask: "Do you have hemorrhoids? How bad is the pain?"  (Scale 1-10; or mild, moderate, severe)     Yes 4. STOOL COMPOSITION: "Are the stools hard?"      Soft 5. BLOOD ON STOOLS: "Has there been any blood on the toilet tissue or on the surface of the BM?" If Yes, ask: "When was the last time?"     No 6. CHRONIC CONSTIPATION: "Is this a new problem for you?"  If No, ask: "How long have you had this problem?" (days, weeks, months)      Yes 7. CHANGES IN DIET OR HYDRATION: "Have there been any recent changes in your diet?" "How much fluids are you drinking on a daily basis?"  "How much have you had to drink today?"     No 8. MEDICINES: "Have you been taking any new medicines?" "Are you taking any narcotic pain medicines?" (e.g., Dilaudid, morphine, Percocet, Vicodin)     Yes 9. LAXATIVES: "Have  you been using any stool softeners, laxatives, or enemas?"  If Yes, ask "What, how often, and when was the last time?"     Miralax 10. ACTIVITY:  "How much walking do you do every day?"  "Has your activity level decreased in the past week?"        No 11. CAUSE: "What do you think is causing the constipation?"        Unsure 12. OTHER SYMPTOMS: "Do you have any other symptoms?" (e.g., abdomen pain, bloating, fever, vomiting)       Abdominal pain 13. MEDICAL HISTORY: "Do you have a history of hemorrhoids, rectal fissures, or rectal surgery or rectal abscess?"         Yes 14. PREGNANCY: "Is there any chance you are pregnant?" "When was your last menstrual period?"       No  Protocols used: Constipation-A-AH

## 2022-10-11 ENCOUNTER — Encounter: Payer: Self-pay | Admitting: Student in an Organized Health Care Education/Training Program

## 2022-10-11 ENCOUNTER — Ambulatory Visit
Payer: Medicare HMO | Attending: Student in an Organized Health Care Education/Training Program | Admitting: Student in an Organized Health Care Education/Training Program

## 2022-10-11 VITALS — BP 149/36 | HR 120 | Temp 97.2°F | Resp 16 | Ht 68.5 in | Wt 196.0 lb

## 2022-10-11 DIAGNOSIS — G894 Chronic pain syndrome: Secondary | ICD-10-CM | POA: Diagnosis not present

## 2022-10-11 DIAGNOSIS — M722 Plantar fascial fibromatosis: Secondary | ICD-10-CM | POA: Diagnosis not present

## 2022-10-11 DIAGNOSIS — M5416 Radiculopathy, lumbar region: Secondary | ICD-10-CM | POA: Diagnosis not present

## 2022-10-11 DIAGNOSIS — M1712 Unilateral primary osteoarthritis, left knee: Secondary | ICD-10-CM | POA: Diagnosis not present

## 2022-10-11 DIAGNOSIS — M25571 Pain in right ankle and joints of right foot: Secondary | ICD-10-CM

## 2022-10-11 DIAGNOSIS — M170 Bilateral primary osteoarthritis of knee: Secondary | ICD-10-CM

## 2022-10-11 DIAGNOSIS — G8929 Other chronic pain: Secondary | ICD-10-CM | POA: Insufficient documentation

## 2022-10-11 MED ORDER — HYDROCODONE-ACETAMINOPHEN 7.5-325 MG PO TABS: 1.0000 | ORAL_TABLET | Freq: Four times a day (QID) | ORAL | 0 refills | Status: DC | PRN

## 2022-10-11 MED ORDER — HYDROCODONE-ACETAMINOPHEN 7.5-325 MG PO TABS: 1.0000 | ORAL_TABLET | Freq: Four times a day (QID) | ORAL | 0 refills | Status: AC | PRN

## 2022-10-11 NOTE — Progress Notes (Signed)
Nursing Pain Medication Assessment:  Safety precautions to be maintained throughout the outpatient stay will include: orient to surroundings, keep bed in low position, maintain call bell within reach at all times, provide assistance with transfer out of bed and ambulation.  Medication Inspection Compliance: Pill count conducted under aseptic conditions, in front of the patient. Neither the pills nor the bottle was removed from the patient's sight at any time. Once count was completed pills were immediately returned to the patient in their original bottle.  Medication: Hydrocodone/APAP Pill/Patch Count:  20 of 105 pills remain Pill/Patch Appearance: Markings consistent with prescribed medication Bottle Appearance: Standard pharmacy container. Clearly labeled. Filled Date: 06 / 12 / 2024 Last Medication intake:  Today

## 2022-10-11 NOTE — Patient Instructions (Signed)
You will return on 10/12/22 to give urine for UDS.

## 2022-10-11 NOTE — Progress Notes (Signed)
PROVIDER NOTE: Information contained herein reflects review and annotations entered in association with encounter. Interpretation of such information and data should be left to medically-trained personnel. Information provided to patient can be located elsewhere in the medical record under "Patient Instructions". Document created using STT-dictation technology, any transcriptional errors that may result from process are unintentional.    Patient: Pamela Romero  Service Category: E/M  Provider: Edward Jolly, MD  DOB: October 31, 1950  DOS: 10/11/2022  Specialty: Interventional Pain Management  MRN: [id number]  Setting: Ambulatory outpatient  PCP: Lorre Munroe, NP  Type: Established Patient    Referring Provider: Lorre Munroe, NP  Location: Office  Delivery: Face-to-face     HPI  [name], a 72 y.o. year old female, is here today because of her Bilateral primary osteoarthritis of knee [M17.0]. [name] primary complain today is Knee Pain (Bilateral  chronic pain that has been aggravated by a fall this morning ) and Back Pain (Lumbar bilateral )  Last encounter: My last encounter with her was on 08/29/22 Pertinent problems: [name] has Bilateral primary osteoarthritis of knee and Osteoporosis on their pertinent problem list. Pain Assessment: Severity of Chronic pain is reported as a 7 /10. Location: Knee (lower back bilateral) Left, Right/knee pain down to the toes. Onset: More than a month ago. Quality: Discomfort, Constant, Throbbing, Sore. Timing: Constant. Modifying factor(s): medications, injections. Vitals:  height is 5' 8.5" (1.74 m) and weight is 196 lb (88.9 kg). Her temporal temperature is 97.2 F (36.2 C) (abnormal). Her blood pressure is 149/36 (abnormal) and her pulse is 120 (abnormal). Her respiration is 16 and oxygen saturation is 100%.   Reason for encounter:  MM and PPE eval  -patient states that she fell this morning, hurt her back and left knee, experiencing  acute pain- recommend ice, elevation, and APAP 500 mg TID  -good benefit with IA knee kennalog, repeat every 3-4 months  Post-procedure evaluation    Procedure: Steroid Knee Injection (LONG ACTING Kennalog)  Laterality: Bilateral (-50) Level: Intra-articular  No.: R5 L6 Series: n/a Purpose: Therapeutic Indications: Knee arthralgia associated to osteoarthritis of the knee   Imaging: None required (CPT-20610) Analgesia: Skin infiltration w/ local anesthetics Sedation: None  NAS-11 score:   Pre-procedure: 6 /10   Post-procedure: 6 /10       Effectiveness:  Initial hour after procedure: 100 %  Subsequent 4-6 hours post-procedure: 100 %  Analgesia past initial 6 hours: 85 % (right knee is better than the left. good pain releif x 2 weeks.)  Ongoing improvement:  Analgesic:  50% Function: Somewhat improved ROM: Somewhat improved    Pharmacotherapy Assessment  Analgesic: Hydrocodone 7.5 mg 3 times daily as needed with an extra quantity 15/month (#105)For severe breakthrough pain    Monitoring: [place] PMP: PDMP reviewed during this encounter.       Pharmacotherapy: No side-effects or adverse reactions reported. Compliance: No problems identified. Effectiveness: Clinically acceptable.  UDS:  Summary  Date Value Ref Range Status  11/02/2021 Note  Final    Comment:    ==================================================================== ToxASSURE Select 13 (MW) ==================================================================== Test                             Result       Flag       Units  Drug Present and Declared for Prescription Verification   Hydrocodone  1063         EXPECTED   ng/mg creat   Hydromorphone                  119          EXPECTED   ng/mg creat   Dihydrocodeine                 191          EXPECTED   ng/mg creat   Norhydrocodone                 2059         EXPECTED   ng/mg creat    Sources of hydrocodone include scheduled prescription  medications.    Hydromorphone, dihydrocodeine and norhydrocodone are expected    metabolites of hydrocodone. Hydromorphone and dihydrocodeine are    also available as scheduled prescription medications.  ==================================================================== Test                      Result    Flag   Units      Ref Range   Creatinine              172              mg/dL      >=81 ==================================================================== Declared Medications:  The flagging and interpretation on this report are based on the  following declared medications.  Unexpected results may arise from  inaccuracies in the declared medications.   **Note: The testing scope of this panel includes these medications:   Hydrocodone (Norco)   **Note: The testing scope of this panel does not include the  following reported medications:   Acetaminophen (Norco)  Albuterol (Ventolin HFA)  Aspirin  Atorvastatin (Lipitor)  Esomeprazole (Nexium)  Gabapentin (Neurontin)  Hydrochlorothiazide (Zestoretic)  Levothyroxine (Synthroid)  Lisinopril (Zestoretic)  Ondansetron (Zofran)  Rizatriptan (Maxalt)  Sertraline (Zoloft)  Vitamin B12 ==================================================================== For clinical consultation, please call [phone]. ====================================================================       ROS  Constitutional: Denies any fever or chills Gastrointestinal: No reported hemesis, hematochezia, vomiting, or acute GI distress Musculoskeletal:  Left knee pain right knee pain as well,improved after injection Neurological: No reported episodes of acute onset apraxia, aphasia, dysarthria, agnosia, amnesia, paralysis, loss of coordination, or loss of consciousness  Medication Review  HYDROcodone-acetaminophen, albuterol, aspirin EC, atorvastatin, docusate sodium, esomeprazole, famotidine, gabapentin, levothyroxine, lisinopril-hydrochlorothiazide,  ondansetron, polyethylene glycol powder, rizatriptan, and sertraline  History Review  Allergy: [name] has No Known Allergies. Drug: [name]  reports no history of drug use. Alcohol:  reports no history of alcohol use. Tobacco:  reports that she quit smoking about 4 years ago. Her smoking use included cigarettes. She has a 25.00 pack-year smoking history. She has quit using smokeless tobacco. Social: [name]  reports that she quit smoking about 4 years ago. Her smoking use included cigarettes. She has a 25.00 pack-year smoking history. She has quit using smokeless tobacco. She reports that she does not drink alcohol and does not use drugs. Medical:  has a past medical history of Abnormal EKG, CAD (coronary artery disease), Cancer (HCC), COPD (chronic obstructive pulmonary disease) (HCC), Endometrial adenocarcinoma (HCC) (2011), Hyperlipidemia, Hypertension, Osteoarthritis, Reflux gastritis, Thyroid disease, tobacco abuse, and Vaginal delivery. Surgical: [name]  has a past surgical history that includes Thyroidectomy (2005); Cardiac catheterization (2006); Total abdominal hysterectomy; Breast surgery (Right, March 2014); Breast cyst aspiration (Right, 2014); and Esophagogastroduodenoscopy (N/A, 04/07/2020). Family: family history  includes Alcohol abuse in her father, maternal aunt, and mother; Cancer in her brother, brother, brother, and maternal aunt; Heart disease in her brother.  Laboratory Chemistry Profile   Renal Lab Results  Component Value Date   BUN 22 09/01/2022   CREATININE 1.14 (H) 09/01/2022   BCR 19 09/01/2022   GFR 60.34 01/21/2015   GFRAA 61 01/02/2020   GFRNONAA >60 04/07/2020    Hepatic Lab Results  Component Value Date   AST 13 09/01/2022   ALT 9 09/01/2022   ALBUMIN 3.8 (L) 09/01/2022   ALKPHOS 77 09/01/2022   HCVAB NEGATIVE 01/21/2015   LIPASE 13 (L) 09/01/2022    Electrolytes Lab Results  Component Value Date   NA 142 09/01/2022   K 4.5  09/01/2022   CL 104 09/01/2022   CALCIUM 9.9 09/01/2022   MG 1.3 (L) 09/15/2021    Bone Lab Results  Component Value Date   VD25OH 18 (L) 12/09/2021    Inflammation (CRP: Acute Phase) (ESR: Chronic Phase) Lab Results  Component Value Date   ESRSEDRATE 19 03/30/2012         Note: Above Lab results reviewed.   Physical Exam  General appearance: Well nourished, well developed, and well hydrated. In no apparent acute distress Mental status: Alert, oriented x 3 (person, place, & time)       Respiratory: No evidence of acute respiratory distress Eyes: PERLA Vitals: BP (!) 149/36 (BP Location: Right Arm, Patient Position: Sitting, Cuff Size: Large)   Pulse (!) 120   Temp (!) 97.2 F (36.2 C) (Temporal)   Resp 16   Ht 5' 8.5" (1.74 m)   Wt 196 lb (88.9 kg)   SpO2 100%   BMI 29.37 kg/m  BMI: Estimated body mass index is 29.37 kg/m as calculated from the following:   Height as of this encounter: 5' 8.5" (1.74 m).   Weight as of this encounter: 196 lb (88.9 kg). Ideal: Ideal body weight: 65 kg (143 lb 6.5 oz) Adjusted ideal body weight: 74.6 kg (164 lb 7.1 oz)  Improved knee pain  Assessment    Diagnosis   1. Bilateral primary osteoarthritis of knee   2. Chronic radicular lumbar pain (left)   3. Plantar fascia syndrome   4. Chronic pain of right ankle   5. Primary osteoarthritis of left knee   6. Chronic pain syndrome         Plan of Care   Requested Prescriptions   Signed Prescriptions Disp Refills   HYDROcodone-acetaminophen (NORCO) 7.5-325 MG tablet 105 tablet 0    Sig: Take 1 tablet by mouth every 6 (six) hours as needed for severe pain. Must last 30 days.   HYDROcodone-acetaminophen (NORCO) 7.5-325 MG tablet 105 tablet 0    Sig: Take 1 tablet by mouth every 6 (six) hours as needed for severe pain. Must last 30 days.   HYDROcodone-acetaminophen (NORCO) 7.5-325 MG tablet 105 tablet 0    Sig: Take 1 tablet by mouth every 6 (six) hours as needed for severe  pain. Must last 30 days.   Orders Placed This Encounter  Procedures   ToxASSURE Select 13 (MW), Urine    Volume: 30 ml(s). Minimum 3 ml of urine is needed. Document temperature of fresh sample. Indications: Long term (current) use of opiate analgesic (Z61.096)    Order Specific Question:   Release to patient    Answer:   Immediate       Follow-up plan:   Return in about 14 weeks (around  01/17/2023) for Medication Management, in person.    Recent Visits Date Type Provider Dept  08/29/22 Procedure visit Edward Jolly, MD [facility]  Showing recent visits within past 90 days and meeting all other requirements Today's Visits Date Type Provider Dept  10/11/22 Office Visit Edward Jolly, MD [facility]  Showing today's visits and meeting all other requirements Future Appointments No visits were found meeting these conditions. Showing future appointments within next 90 days and meeting all other requirements  I discussed the assessment and treatment plan with the patient. The patient was provided an opportunity to ask questions and all were answered. The patient agreed with the plan and demonstrated an understanding of the instructions.  Patient advised to call back or seek an in-person evaluation if the symptoms or condition worsens.  Duration of encounter: .  Note by: Edward Jolly, MD Date: 10/11/2022; Time: 11:36 AM

## 2022-10-12 DIAGNOSIS — G894 Chronic pain syndrome: Secondary | ICD-10-CM | POA: Diagnosis not present

## 2022-10-17 ENCOUNTER — Encounter: Payer: Medicare HMO | Admitting: Internal Medicine

## 2022-10-17 ENCOUNTER — Ambulatory Visit: Payer: Medicare HMO | Admitting: Internal Medicine

## 2022-10-17 LAB — TOXASSURE SELECT 13 (MW), URINE

## 2022-10-17 NOTE — Progress Notes (Deleted)
Subjective:    Patient ID: Pamela Romero, female    DOB: 04-24-1950, 72 y.o.   MRN: [id number]  HPI  Patient presents to clinic today for her annual exam.  Flu: 03/2022 Tetanus: 01/2022 COVID: Pfizer x 2 Pneumovax: 01/2015 Prevnar 20: 09/2021 Shingrix: Never Pap smear: Hysterectomy Mammogram: 11/2019, scheduled 11/2022 Bone density: >5 years ago, scheduled 11/2022 Colon screening: Vision screening: Dentist:  Diet: Exercise:  Review of Systems     Past Medical History:  Diagnosis Date   Abnormal EKG    CAD (coronary artery disease)    non critical, by cardiac cath 2006   Cancer [facility])    endometrial Ca   COPD (chronic obstructive pulmonary disease) (HCC)    Endometrial adenocarcinoma (HCC) 2011   Hyperlipidemia    Hypertension    Osteoarthritis    Right foot, Left knee (Dr. Joice Lofts)   Reflux gastritis    Thyroid disease    tobacco abuse    Vaginal delivery    x 2    Current Outpatient Medications  Medication Sig Dispense Refill   albuterol (PROAIR HFA) 108 (90 Base) MCG/ACT inhaler Inhale 1-2 puffs into the lungs every 6 (six) hours as needed for wheezing or shortness of breath. 6.7 g 1   aspirin EC 81 MG tablet Take 1 tablet (81 mg total) by mouth daily. Swallow whole. 30 tablet 12   atorvastatin (LIPITOR) 10 MG tablet Take 1 tablet (10 mg total) by mouth daily. 90 tablet 1   docusate sodium (COLACE) 100 MG capsule Take 1 capsule (100 mg total) by mouth at bedtime as needed for mild constipation. 90 capsule 0   esomeprazole (NEXIUM) 40 MG capsule Take 1 capsule (40 mg total) by mouth 2 (two) times daily before a meal. 180 capsule 0   famotidine (PEPCID) 20 MG tablet Take 1 tablet (20 mg total) by mouth at bedtime. 90 tablet 1   gabapentin (NEURONTIN) 300 MG capsule Take 1-2 capsules (300-600 mg total) by mouth at bedtime. 180 capsule 2   [START ON 10/21/2022] HYDROcodone-acetaminophen (NORCO) 7.5-325 MG tablet Take 1 tablet by mouth every 6 (six) hours as needed  for severe pain. Must last 30 days. 105 tablet 0   [START ON 11/20/2022] HYDROcodone-acetaminophen (NORCO) 7.5-325 MG tablet Take 1 tablet by mouth every 6 (six) hours as needed for severe pain. Must last 30 days. 105 tablet 0   [START ON 12/20/2022] HYDROcodone-acetaminophen (NORCO) 7.5-325 MG tablet Take 1 tablet by mouth every 6 (six) hours as needed for severe pain. Must last 30 days. 105 tablet 0   levothyroxine (SYNTHROID) 125 MCG tablet TAKE 1 TABLET BY MOUTH ONCE DAILY ON AN EMPTY STOMACH. WAIT 30 MINUTES BEFORE TAKING OTHER MEDS. 90 tablet 1   lisinopril-hydrochlorothiazide (ZESTORETIC) 20-12.5 MG tablet TAKE 1 TABLET BY MOUTH ONCE DAILY 90 tablet 1   ondansetron (ZOFRAN-ODT) 4 MG disintegrating tablet Take 1 tablet (4 mg total) by mouth daily as needed for nausea or vomiting. 30 tablet 0   polyethylene glycol powder (GLYCOLAX/MIRALAX) 17 GM/SCOOP powder Take 17 g by mouth daily. 3350 g 1   rizatriptan (MAXALT-MLT) 10 MG disintegrating tablet Take 1 tablet (10 mg total) by mouth as needed for migraine. May repeat in 2 hours if needed 10 tablet 2   sertraline (ZOLOFT) 50 MG tablet Take 1/2 tab PO x 10 days then increase to 1 tab daily thereafter 90 tablet 1   Current Facility-Administered Medications  Medication Dose Route Frequency Provider Last Rate Last Admin  naloxegol oxalate (MOVANTIK) tablet 12.5 mg  12.5 mg Oral Daily Celso Amy, PA-C        No Known Allergies  Family History  Problem Relation Age of Onset   Alcohol abuse Father    Cancer Maternal Aunt        stomach   Alcohol abuse Maternal Aunt    Alcohol abuse Mother    Heart disease Brother        MI age 32- non smoker   Cancer Brother        Pancreatic   Cancer Brother        colon, dx'd age 33   Cancer Brother        stomach, paternal half brother   Breast cancer Neg Hx     Social History   Socioeconomic History   Marital status: Married    Spouse name: Not on file   Number of children: 0   Years of  education: Not on file   Highest education level: Not on file  Occupational History   Occupation: retired   Tobacco Use   Smoking status: Former    Packs/day: 0.50    Years: 50.00    Additional pack years: 0.00    Total pack years: 25.00    Types: Cigarettes    Quit date: 05/11/2018    Years since quitting: 4.4   Smokeless tobacco: Former  Building services engineer Use: Never used  Substance and Sexual Activity   Alcohol use: No   Drug use: No   Sexual activity: Not on file  Other Topics Concern   Not on file  Social History Narrative   Lives with spouse, takes care of her mother.   Had 2 children, one died at birth, the other given up for adoption (was unmarried).   Social Determinants of Health   Financial Resource Strain: Low Risk  (04/29/2022)   Overall Financial Resource Strain (CARDIA)    Difficulty of Paying Living Expenses: Not very hard  Food Insecurity: No Food Insecurity (04/29/2022)   Hunger Vital Sign    Worried About Running Out of Food in the Last Year: Never true    Ran Out of Food in the Last Year: Never true  Transportation Needs: No Transportation Needs (04/29/2022)   PRAPARE - Administrator, Civil Service (Medical): No    Lack of Transportation (Non-Medical): No  Physical Activity: Insufficiently Active (04/29/2022)   Exercise Vital Sign    Days of Exercise per Week: 3 days    Minutes of Exercise per Session: 30 min  Stress: No Stress Concern Present (04/29/2022)   [employer] of Occupational Health - Occupational Stress Questionnaire    Feeling of Stress : Only a little  Social Connections: Moderately Integrated (04/29/2022)   Social Connection and Isolation Panel [NHANES]    Frequency of Communication with Friends and Family: Three times a week    Frequency of Social Gatherings with Friends and Family: Twice a week    Attends Religious Services: More than 4 times per year    Active Member of [employer] or Organizations: No    Attends Tax inspector Meetings: Never    Marital Status: Married  Catering manager Violence: Not At Risk (04/29/2022)   Humiliation, Afraid, Rape, and Kick questionnaire    Fear of Current or Ex-Partner: No    Emotionally Abused: No    Physically Abused: No    Sexually Abused: No     Constitutional: Patient reports  intermittent headaches.  Denies fever, malaise, fatigue, or abrupt weight changes.  HEENT: Denies eye pain, eye redness, ear pain, ringing in the ears, wax buildup, runny nose, nasal congestion, bloody nose, or sore throat. Respiratory: Denies difficulty breathing, shortness of breath, cough or sputum production.   Cardiovascular: Denies chest pain, chest tightness, palpitations or swelling in the hands or feet.  Gastrointestinal: Denies abdominal pain, bloating, constipation, diarrhea or blood in the stool.  GU: Denies urgency, frequency, pain with urination, burning sensation, blood in urine, odor or discharge. Musculoskeletal: Patient reports chronic joint pain.  Denies decrease in range of motion, difficulty with gait, muscle pain or joint swelling.  Skin: Denies redness, rashes, lesions or ulcercations.  Neurological: Patient reports insomnia.  Denies dizziness, difficulty with memory, difficulty with speech or problems with balance and coordination.  Psych: Patient has a history of anxiety and depression.  Denies SI/HI.  No other specific complaints in a complete review of systems (except as listed in HPI above).  Objective:   Physical Exam   There were no vitals taken for this visit. Wt Readings from Last 3 Encounters:  10/11/22 196 lb (88.9 kg)  09/01/22 206 lb (93.4 kg)  08/29/22 198 lb (89.8 kg)    General: Appears their stated age, well developed, well nourished in NAD. Skin: Warm, dry and intact. No rashes, lesions or ulcerations noted. HEENT: Head: normal shape and size; Eyes: sclera white, no icterus, conjunctiva pink, PERRLA and EOMs intact; Ears: Tm's gray and  intact, normal light reflex; Nose: mucosa pink and moist, septum midline; Throat/Mouth: Teeth present, mucosa pink and moist, no exudate, lesions or ulcerations noted.  Neck:  Neck supple, trachea midline. No masses, lumps or thyromegaly present.  Cardiovascular: Normal rate and rhythm. S1,S2 noted.  No murmur, rubs or gallops noted. No JVD or BLE edema. No carotid bruits noted. Pulmonary/Chest: Normal effort and positive vesicular breath sounds. No respiratory distress. No wheezes, rales or ronchi noted.  Abdomen: Soft and nontender. Normal bowel sounds. No distention or masses noted. Liver, spleen and kidneys non palpable. Musculoskeletal: Normal range of motion. No signs of joint swelling. No difficulty with gait.  Neurological: Alert and oriented. Cranial nerves II-XII grossly intact. Coordination normal.  Psychiatric: Mood and affect normal. Behavior is normal. Judgment and thought content normal.     BMET    Component Value Date/Time   NA 142 09/01/2022 1017   K 4.5 09/01/2022 1017   CL 104 09/01/2022 1017   CO2 25 09/01/2022 1017   GLUCOSE 92 09/01/2022 1017   GLUCOSE 78 04/08/2022 1130   BUN 22 09/01/2022 1017   CREATININE 1.14 (H) 09/01/2022 1017   CREATININE 1.13 (H) 04/08/2022 1130   CALCIUM 9.9 09/01/2022 1017   GFRNONAA >60 04/07/2020 1022   GFRNONAA 52 (L) 01/02/2020 1453   GFRAA 61 01/02/2020 1453    Lipid Panel     Component Value Date/Time   CHOL 149 04/08/2022 1130   TRIG 92 04/08/2022 1130   HDL 46 (L) 04/08/2022 1130   CHOLHDL 3.2 04/08/2022 1130   VLDL 42.0 (H) 05/14/2014 0918   LDLCALC 84 04/08/2022 1130    CBC    Component Value Date/Time   WBC 6.8 09/01/2022 1017   WBC 5.4 04/08/2022 1130   RBC 3.35 (L) 09/01/2022 1017   RBC 3.17 (L) 04/08/2022 1130   HGB 10.1 (L) 09/01/2022 1017   HCT 31.4 (L) 09/01/2022 1017   PLT 134 (L) 04/08/2022 1130   MCV 94 09/01/2022 1017  MCH 30.1 09/01/2022 1017   MCH 32.5 04/08/2022 1130   MCHC 32.2 09/01/2022  1017   MCHC 33.6 04/08/2022 1130   RDW 13.3 09/01/2022 1017   LYMPHSABS 1.7 09/01/2022 1017   MONOABS 0.4 09/16/2013 1046   EOSABS 0.1 09/01/2022 1017   BASOSABS 0.1 09/01/2022 1017    Hgb A1C Lab Results  Component Value Date   HGBA1C 4.9 10/05/2021           Assessment & Plan:   Preventative health maintenance:  Encouraged her to get a flu shot in the fall She declines tetanus for financial reasons, advised if she gets bit or cut to go get this done Pneumovax and Prevnar UTD Discussed Shingrix vaccine, she will check coverage with her insurance company schedule visit if she would like to have this done She no longer needs to screen for cervical cancer Mammogram and bone density are already scheduled Referral to GI for screening colonoscopy Encouraged her to consume a balanced diet and exercise regimen Advised her to see an eye doctor and dentist annually We will check CBC, c-Met, TSH, free T4, lipid, A1c today  RTC in 6 months, follow-up chronic conditions Nicki Reaper, NP

## 2022-10-19 ENCOUNTER — Encounter: Payer: Medicare HMO | Admitting: Internal Medicine

## 2022-10-19 NOTE — Progress Notes (Deleted)
Subjective:    Patient ID: Pamela Romero, female    DOB: 1950-08-04, 72 y.o.   MRN: [id number]  HPI  Patient presents to clinic today for her annual exam.  Flu: 03/2022 Tetanus: 01/2012 COVID: Pfizer x 2 Pneumovax: 09/2019 Prevnar 20: 09/2021 Shingrix: Never Pap smear: Hysterectomy Mammogram: 11/2019, scheduled 11/2022 Bone density: 11/2019, scheduled 11/2022 Colon screening: Vision screening: Dentist:  Diet: Exercise:   Review of Systems     Past Medical History:  Diagnosis Date   Abnormal EKG    CAD (coronary artery disease)    non critical, by cardiac cath 2006   Cancer [facility])    endometrial Ca   COPD (chronic obstructive pulmonary disease) (HCC)    Endometrial adenocarcinoma (HCC) 2011   Hyperlipidemia    Hypertension    Osteoarthritis    Right foot, Left knee (Dr. Joice Lofts)   Reflux gastritis    Thyroid disease    tobacco abuse    Vaginal delivery    x 2    Current Outpatient Medications  Medication Sig Dispense Refill   albuterol (PROAIR HFA) 108 (90 Base) MCG/ACT inhaler Inhale 1-2 puffs into the lungs every 6 (six) hours as needed for wheezing or shortness of breath. 6.7 g 1   aspirin EC 81 MG tablet Take 1 tablet (81 mg total) by mouth daily. Swallow whole. 30 tablet 12   atorvastatin (LIPITOR) 10 MG tablet Take 1 tablet (10 mg total) by mouth daily. 90 tablet 1   docusate sodium (COLACE) 100 MG capsule Take 1 capsule (100 mg total) by mouth at bedtime as needed for mild constipation. 90 capsule 0   esomeprazole (NEXIUM) 40 MG capsule Take 1 capsule (40 mg total) by mouth 2 (two) times daily before a meal. 180 capsule 0   famotidine (PEPCID) 20 MG tablet Take 1 tablet (20 mg total) by mouth at bedtime. 90 tablet 1   gabapentin (NEURONTIN) 300 MG capsule Take 1-2 capsules (300-600 mg total) by mouth at bedtime. 180 capsule 2   [START ON 10/21/2022] HYDROcodone-acetaminophen (NORCO) 7.5-325 MG tablet Take 1 tablet by mouth every 6 (six) hours as needed for  severe pain. Must last 30 days. 105 tablet 0   [START ON 11/20/2022] HYDROcodone-acetaminophen (NORCO) 7.5-325 MG tablet Take 1 tablet by mouth every 6 (six) hours as needed for severe pain. Must last 30 days. 105 tablet 0   [START ON 12/20/2022] HYDROcodone-acetaminophen (NORCO) 7.5-325 MG tablet Take 1 tablet by mouth every 6 (six) hours as needed for severe pain. Must last 30 days. 105 tablet 0   levothyroxine (SYNTHROID) 125 MCG tablet TAKE 1 TABLET BY MOUTH ONCE DAILY ON AN EMPTY STOMACH. WAIT 30 MINUTES BEFORE TAKING OTHER MEDS. 90 tablet 1   lisinopril-hydrochlorothiazide (ZESTORETIC) 20-12.5 MG tablet TAKE 1 TABLET BY MOUTH ONCE DAILY 90 tablet 1   ondansetron (ZOFRAN-ODT) 4 MG disintegrating tablet Take 1 tablet (4 mg total) by mouth daily as needed for nausea or vomiting. 30 tablet 0   polyethylene glycol powder (GLYCOLAX/MIRALAX) 17 GM/SCOOP powder Take 17 g by mouth daily. 3350 g 1   rizatriptan (MAXALT-MLT) 10 MG disintegrating tablet Take 1 tablet (10 mg total) by mouth as needed for migraine. May repeat in 2 hours if needed 10 tablet 2   sertraline (ZOLOFT) 50 MG tablet Take 1/2 tab PO x 10 days then increase to 1 tab daily thereafter 90 tablet 1   Current Facility-Administered Medications  Medication Dose Route Frequency Provider Last Rate Last Admin  naloxegol oxalate (MOVANTIK) tablet 12.5 mg  12.5 mg Oral Daily Celso Amy, PA-C        No Known Allergies  Family History  Problem Relation Age of Onset   Alcohol abuse Father    Cancer Maternal Aunt        stomach   Alcohol abuse Maternal Aunt    Alcohol abuse Mother    Heart disease Brother        MI age 70- non smoker   Cancer Brother        Pancreatic   Cancer Brother        colon, dx'd age 19   Cancer Brother        stomach, paternal half brother   Breast cancer Neg Hx     Social History   Socioeconomic History   Marital status: Married    Spouse name: Not on file   Number of children: 0   Years of  education: Not on file   Highest education level: Not on file  Occupational History   Occupation: retired   Tobacco Use   Smoking status: Former    Packs/day: 0.50    Years: 50.00    Additional pack years: 0.00    Total pack years: 25.00    Types: Cigarettes    Quit date: 05/11/2018    Years since quitting: 4.4   Smokeless tobacco: Former  Building services engineer Use: Never used  Substance and Sexual Activity   Alcohol use: No   Drug use: No   Sexual activity: Not on file  Other Topics Concern   Not on file  Social History Narrative   Lives with spouse, takes care of her mother.   Had 2 children, one died at birth, the other given up for adoption (was unmarried).   Social Determinants of Health   Financial Resource Strain: Low Risk  (04/29/2022)   Overall Financial Resource Strain (CARDIA)    Difficulty of Paying Living Expenses: Not very hard  Food Insecurity: No Food Insecurity (04/29/2022)   Hunger Vital Sign    Worried About Running Out of Food in the Last Year: Never true    Ran Out of Food in the Last Year: Never true  Transportation Needs: No Transportation Needs (04/29/2022)   PRAPARE - Administrator, Civil Service (Medical): No    Lack of Transportation (Non-Medical): No  Physical Activity: Insufficiently Active (04/29/2022)   Exercise Vital Sign    Days of Exercise per Week: 3 days    Minutes of Exercise per Session: 30 min  Stress: No Stress Concern Present (04/29/2022)   [employer] of Occupational Health - Occupational Stress Questionnaire    Feeling of Stress : Only a little  Social Connections: Moderately Integrated (04/29/2022)   Social Connection and Isolation Panel [NHANES]    Frequency of Communication with Friends and Family: Three times a week    Frequency of Social Gatherings with Friends and Family: Twice a week    Attends Religious Services: More than 4 times per year    Active Member of [employer] or Organizations: No    Attends Tax inspector Meetings: Never    Marital Status: Married  Catering manager Violence: Not At Risk (04/29/2022)   Humiliation, Afraid, Rape, and Kick questionnaire    Fear of Current or Ex-Partner: No    Emotionally Abused: No    Physically Abused: No    Sexually Abused: No     Constitutional: Patient reports  intermittent headaches.  Denies fever, malaise, fatigue, or abrupt weight changes.  HEENT: Denies eye pain, eye redness, ear pain, ringing in the ears, wax buildup, runny nose, nasal congestion, bloody nose, or sore throat. Respiratory: Denies difficulty breathing, shortness of breath, cough or sputum production.   Cardiovascular: Denies chest pain, chest tightness, palpitations or swelling in the hands or feet.  Gastrointestinal: Patient reports nausea and vomiting.  Denies abdominal pain, bloating, constipation, diarrhea or blood in the stool.  GU: Denies urgency, frequency, pain with urination, burning sensation, blood in urine, odor or discharge. Musculoskeletal: Patient reports joint pain.  Denies decrease in range of motion, difficulty with gait, muscle pain or joint swelling.  Skin: Denies redness, rashes, lesions or ulcercations.  Neurological: Patient reports insomnia.  Denies dizziness, difficulty with memory, difficulty with speech or problems with balance and coordination.  Psych: Patient has a history of anxiety depression.  Denies SI/HI.  No other specific complaints in a complete review of systems (except as listed in HPI above).  Objective:   Physical Exam   There were no vitals taken for this visit. Wt Readings from Last 3 Encounters:  10/11/22 196 lb (88.9 kg)  09/01/22 206 lb (93.4 kg)  08/29/22 198 lb (89.8 kg)    General: Appears their stated age, well developed, well nourished in NAD. Skin: Warm, dry and intact. No rashes, lesions or ulcerations noted. HEENT: Head: normal shape and size; Eyes: sclera white, no icterus, conjunctiva pink, PERRLA and EOMs  intact; Ears: Tm's gray and intact, normal light reflex; Nose: mucosa pink and moist, septum midline; Throat/Mouth: Teeth present, mucosa pink and moist, no exudate, lesions or ulcerations noted.  Neck:  Neck supple, trachea midline. No masses, lumps or thyromegaly present.  Cardiovascular: Normal rate and rhythm. S1,S2 noted.  No murmur, rubs or gallops noted. No JVD or BLE edema. No carotid bruits noted. Pulmonary/Chest: Normal effort and positive vesicular breath sounds. No respiratory distress. No wheezes, rales or ronchi noted.  Abdomen: Soft and nontender. Normal bowel sounds. No distention or masses noted. Liver, spleen and kidneys non palpable. Musculoskeletal: Normal range of motion. No signs of joint swelling. No difficulty with gait.  Neurological: Alert and oriented. Cranial nerves II-XII grossly intact. Coordination normal.  Psychiatric: Mood and affect normal. Behavior is normal. Judgment and thought content normal.     BMET    Component Value Date/Time   NA 142 09/01/2022 1017   K 4.5 09/01/2022 1017   CL 104 09/01/2022 1017   CO2 25 09/01/2022 1017   GLUCOSE 92 09/01/2022 1017   GLUCOSE 78 04/08/2022 1130   BUN 22 09/01/2022 1017   CREATININE 1.14 (H) 09/01/2022 1017   CREATININE 1.13 (H) 04/08/2022 1130   CALCIUM 9.9 09/01/2022 1017   GFRNONAA >60 04/07/2020 1022   GFRNONAA 52 (L) 01/02/2020 1453   GFRAA 61 01/02/2020 1453    Lipid Panel     Component Value Date/Time   CHOL 149 04/08/2022 1130   TRIG 92 04/08/2022 1130   HDL 46 (L) 04/08/2022 1130   CHOLHDL 3.2 04/08/2022 1130   VLDL 42.0 (H) 05/14/2014 0918   LDLCALC 84 04/08/2022 1130    CBC    Component Value Date/Time   WBC 6.8 09/01/2022 1017   WBC 5.4 04/08/2022 1130   RBC 3.35 (L) 09/01/2022 1017   RBC 3.17 (L) 04/08/2022 1130   HGB 10.1 (L) 09/01/2022 1017   HCT 31.4 (L) 09/01/2022 1017   PLT 134 (L) 04/08/2022 1130   MCV  94 09/01/2022 1017   MCH 30.1 09/01/2022 1017   MCH 32.5 04/08/2022  1130   MCHC 32.2 09/01/2022 1017   MCHC 33.6 04/08/2022 1130   RDW 13.3 09/01/2022 1017   LYMPHSABS 1.7 09/01/2022 1017   MONOABS 0.4 09/16/2013 1046   EOSABS 0.1 09/01/2022 1017   BASOSABS 0.1 09/01/2022 1017    Hgb A1C Lab Results  Component Value Date   HGBA1C 4.9 10/05/2021           Assessment & Plan:   Preventative health maintenance:  Encouraged her to get a flu shot in the fall She declines tetanus for financial reasons, advised if she gets bit or cut to go get this done Encouraged her to get her COVID booster Pneumovax and Prevnar UTD Discussed Shingrix vaccine, she will check coverage with her insurance company and schedule visit if she would like to have this done She no longer needs to screen for cervical cancer Mammogram and bone density have been scheduled Referral to GI for screening colonoscopy Encouraged her to consume a balanced diet and exercise regimen Advised her to see an eye doctor and dentist annually We will check CBC, c-Met, lipid, A1c today  RTC in 6 months, follow-up chronic conditions Nicki Reaper, NP

## 2022-10-24 ENCOUNTER — Ambulatory Visit: Payer: Medicare HMO | Admitting: Pharmacist

## 2022-10-24 DIAGNOSIS — E785 Hyperlipidemia, unspecified: Secondary | ICD-10-CM

## 2022-10-24 DIAGNOSIS — I10 Essential (primary) hypertension: Secondary | ICD-10-CM

## 2022-10-24 NOTE — Progress Notes (Signed)
10/24/2022 Name: Pamela Romero MRN: [id number] DOB: 21-Oct-1950  Chief Complaint  Patient presents with   Medication Adherence    Pamela Romero is a 72 y.o. year old female who presented for a telephone visit.   They were referred to the pharmacist by their PCP for assistance in managing .    Subjective:  Care Team: Primary Care Provider: Lorre Munroe, NP ; Next Scheduled Visit: 10/26/2022 Gastroenterology: Wyline Mood, MD; Next Scheduled Visit: 10/31/2022 Pain Specialist: Edward Jolly, MD; Next Scheduled Visit: 01/12/2023  Medication Access/Adherence  Current Pharmacy:  [facility] - [place], [place] - [address] [place] [address] Phone: [phone] Fax: [phone]   Patient reports affordability concerns with their medications: No  Patient reports access/transportation concerns to their pharmacy: No  Patient reports adherence concerns with their medications:  No    Denies missing doses since started using weekly pillbox  Hypertension:  Current medications: lisinopril/HCTZ 20-12.5 mg daily  Reports needs to obtain a new upper arm blood pressure monitor  Patient denies hypotensive s/sx including dizziness, lightheadedness as long as takes positional changes slowly  Current physical activity: limited by knee and back pain. Reports gradually trying to walk a little more   Objective:  Lab Results  Component Value Date   CREATININE 1.14 (H) 09/01/2022   BUN 22 09/01/2022   NA 142 09/01/2022   K 4.5 09/01/2022   CL 104 09/01/2022   CO2 25 09/01/2022    Lab Results  Component Value Date   CHOL 149 04/08/2022   HDL 46 (L) 04/08/2022   LDLCALC 84 04/08/2022   LDLDIRECT 144.0 01/21/2015   TRIG 92 04/08/2022   CHOLHDL 3.2 04/08/2022   BP Readings from Last 3 Encounters:  10/11/22 (!) 149/36  09/01/22 131/87  08/29/22 (!) 133/90   Pulse Readings from Last 3 Encounters:  10/11/22 (!) 120  09/01/22 92  08/29/22 96     Medications Reviewed Today     Reviewed by Manuela Neptune, RPH-CPP (Pharmacist) on 10/24/22 at 1457  Med List Status: <None>   Medication Order Taking? Sig Documenting Provider Last Dose Status Informant  albuterol (PROAIR HFA) 108 (90 Base) MCG/ACT inhaler [id number] Yes Inhale 1-2 puffs into the lungs every 6 (six) hours as needed for wheezing or shortness of breath. Lorre Munroe, NP Taking Active   aspirin EC 81 MG tablet [id number] Yes Take 1 tablet (81 mg total) by mouth daily. Swallow whole. Lorre Munroe, NP Taking Active   atorvastatin (LIPITOR) 10 MG tablet [id number] Yes Take 1 tablet (10 mg total) by mouth daily. Lorre Munroe, NP Taking Active   docusate sodium (COLACE) 100 MG capsule [id number] Yes Take 1 capsule (100 mg total) by mouth at bedtime as needed for mild constipation. Lorre Munroe, NP Taking Active   esomeprazole (NEXIUM) 40 MG capsule [id number] Yes Take 1 capsule (40 mg total) by mouth 2 (two) times daily before a meal. Lorre Munroe, NP Taking Active   famotidine (PEPCID) 20 MG tablet [id number] No Take 1 tablet (20 mg total) by mouth at bedtime.  Patient not taking: Reported on 10/24/2022   Lorre Munroe, NP Not Taking Active   gabapentin (NEURONTIN) 300 MG capsule [id number] Yes Take 1-2 capsules (300-600 mg total) by mouth at bedtime.  Patient taking differently: Take 300-600 mg by mouth at bedtime as needed.   Edward Jolly, MD Taking Active   HYDROcodone-acetaminophen (NORCO) 7.5-325 MG tablet [id number] Yes  Take 1 tablet by mouth every 6 (six) hours as needed for severe pain. Must last 30 days. Edward Jolly, MD Taking Active   HYDROcodone-acetaminophen (NORCO) 7.5-325 MG tablet [id number]  Take 1 tablet by mouth every 6 (six) hours as needed for severe pain. Must last 30 days. Edward Jolly, MD  Active   HYDROcodone-acetaminophen (NORCO) 7.5-325 MG tablet [id number]  Take 1 tablet by mouth every 6 (six) hours as needed for severe pain. Must last 30  days. Edward Jolly, MD  Active   levothyroxine (SYNTHROID) 125 MCG tablet [id number] Yes TAKE 1 TABLET BY MOUTH ONCE DAILY ON AN EMPTY STOMACH. WAIT 30 MINUTES BEFORE TAKING OTHER MEDS. Lorre Munroe, NP Taking Active   lisinopril-hydrochlorothiazide (ZESTORETIC) 20-12.5 MG tablet [id number] Yes TAKE 1 TABLET BY MOUTH ONCE DAILY Lorre Munroe, NP Taking Active   naloxegol oxalate (MOVANTIK) tablet 12.5 mg [id number]   Celso Amy, PA-C  Active   ondansetron (ZOFRAN-ODT) 4 MG disintegrating tablet [id number] Yes Take 1 tablet (4 mg total) by mouth daily as needed for nausea or vomiting. Lorre Munroe, NP Taking Active   polyethylene glycol powder (GLYCOLAX/MIRALAX) 17 GM/SCOOP powder [id number] Yes Take 17 g by mouth daily. Lorre Munroe, NP Taking Active   rizatriptan (MAXALT-MLT) 10 MG disintegrating tablet [id number] Yes Take 1 tablet (10 mg total) by mouth as needed for migraine. May repeat in 2 hours if needed Smitty Cords, DO Taking Active   sertraline (ZOLOFT) 50 MG tablet [id number] No Take 1/2 tab PO x 10 days then increase to 1 tab daily thereafter  Patient not taking: Reported on 10/24/2022   Lorre Munroe, NP Not Taking Active   Med List Note Nonah Mattes, RN 10/11/22 1114): Serum drug screen 05/31/18 MR 01/19/23 PA sent via [facility] [id number] , 05/06/21 Bernita Raisin.  LP               Assessment/Plan:   Comprehensive medication review performed; medication list updated in electronic medical record  Encourage patient to continue to use weekly pillbox to aid with medication adherence and to also use latest copy of medication list when refilling pillbox.  Hypertension: - Reviewed long term cardiovascular and renal outcomes of uncontrolled blood pressure - Recommend to obtain a new upper arm blood pressure monitor using health plan over the counter benefit, restart monitoring home blood pressure, keep log of results and have this record to review during upcoming  medical appointments   Follow Up Plan: Clinical Pharmacist will follow up with patient by telephone again in 3 months  Estelle Grumbles, PharmD, [facility] Clinical Pharmacist [facility] [phone]

## 2022-10-24 NOTE — Patient Instructions (Signed)
Goals Addressed             This Visit's Progress    Pharmacy Goals       Please continue to use a weekly pillbox to organize your medications.  Please check your home blood pressure, keep a log of the results and bring this with you to your medical appointments.  Estelle Grumbles, PharmD, [facility] Clinical Pharmacist [facility] [phone]

## 2022-10-26 ENCOUNTER — Encounter: Payer: Self-pay | Admitting: Internal Medicine

## 2022-10-26 ENCOUNTER — Ambulatory Visit (INDEPENDENT_AMBULATORY_CARE_PROVIDER_SITE_OTHER): Payer: Medicare HMO | Admitting: Internal Medicine

## 2022-10-26 VITALS — BP 91/59 | HR 104 | Temp 96.2°F | Wt 188.0 lb

## 2022-10-26 DIAGNOSIS — Z0001 Encounter for general adult medical examination with abnormal findings: Secondary | ICD-10-CM | POA: Diagnosis not present

## 2022-10-26 DIAGNOSIS — E890 Postprocedural hypothyroidism: Secondary | ICD-10-CM | POA: Diagnosis not present

## 2022-10-26 DIAGNOSIS — M2550 Pain in unspecified joint: Secondary | ICD-10-CM

## 2022-10-26 DIAGNOSIS — R7309 Other abnormal glucose: Secondary | ICD-10-CM | POA: Diagnosis not present

## 2022-10-26 DIAGNOSIS — R109 Unspecified abdominal pain: Secondary | ICD-10-CM

## 2022-10-26 DIAGNOSIS — E663 Overweight: Secondary | ICD-10-CM

## 2022-10-26 DIAGNOSIS — K5909 Other constipation: Secondary | ICD-10-CM | POA: Diagnosis not present

## 2022-10-26 DIAGNOSIS — Z6828 Body mass index (BMI) 28.0-28.9, adult: Secondary | ICD-10-CM

## 2022-10-26 DIAGNOSIS — R112 Nausea with vomiting, unspecified: Secondary | ICD-10-CM

## 2022-10-26 DIAGNOSIS — E785 Hyperlipidemia, unspecified: Secondary | ICD-10-CM

## 2022-10-26 DIAGNOSIS — R634 Abnormal weight loss: Secondary | ICD-10-CM

## 2022-10-26 DIAGNOSIS — G8929 Other chronic pain: Secondary | ICD-10-CM

## 2022-10-26 DIAGNOSIS — R531 Weakness: Secondary | ICD-10-CM | POA: Diagnosis not present

## 2022-10-26 NOTE — Assessment & Plan Note (Signed)
 Encouraged diet and exercise for weight loss ?

## 2022-10-26 NOTE — Patient Instructions (Signed)
Health Maintenance for Postmenopausal Women Menopause is a normal process in which your ability to get pregnant comes to an end. This process happens slowly over many months or years, usually between the ages of 48 and 55. Menopause is complete when you have missed your menstrual period for 12 months. It is important to talk with your health care provider about some of the most common conditions that affect women after menopause (postmenopausal women). These include heart disease, cancer, and bone loss (osteoporosis). Adopting a healthy lifestyle and getting preventive care can help to promote your health and wellness. The actions you take can also lower your chances of developing some of these common conditions. What are the signs and symptoms of menopause? During menopause, you may have the following symptoms: Hot flashes. These can be moderate or severe. Night sweats. Decrease in sex drive. Mood swings. Headaches. Tiredness (fatigue). Irritability. Memory problems. Problems falling asleep or staying asleep. Talk with your health care provider about treatment options for your symptoms. Do I need hormone replacement therapy? Hormone replacement therapy is effective in treating symptoms that are caused by menopause, such as hot flashes and night sweats. Hormone replacement carries certain risks, especially as you become older. If you are thinking about using estrogen or estrogen with progestin, discuss the benefits and risks with your health care provider. How can I reduce my risk for heart disease and stroke? The risk of heart disease, heart attack, and stroke increases as you age. One of the causes may be a change in the body's hormones during menopause. This can affect how your body uses dietary fats, triglycerides, and cholesterol. Heart attack and stroke are medical emergencies. There are many things that you can do to help prevent heart disease and stroke. Watch your blood pressure High  blood pressure causes heart disease and increases the risk of stroke. This is more likely to develop in people who have high blood pressure readings or are overweight. Have your blood pressure checked: Every 3-5 years if you are 18-39 years of age. Every year if you are 40 years old or older. Eat a healthy diet  Eat a diet that includes plenty of vegetables, fruits, low-fat dairy products, and lean protein. Do not eat a lot of foods that are high in solid fats, added sugars, or sodium. Get regular exercise Get regular exercise. This is one of the most important things you can do for your health. Most adults should: Try to exercise for at least 150 minutes each week. The exercise should increase your heart rate and make you sweat (moderate-intensity exercise). Try to do strengthening exercises at least twice each week. Do these in addition to the moderate-intensity exercise. Spend less time sitting. Even light physical activity can be beneficial. Other tips Work with your health care provider to achieve or maintain a healthy weight. Do not use any products that contain nicotine or tobacco. These products include cigarettes, chewing tobacco, and vaping devices, such as e-cigarettes. If you need help quitting, ask your health care provider. Know your numbers. Ask your health care provider to check your cholesterol and your blood sugar (glucose). Continue to have your blood tested as directed by your health care provider. Do I need screening for cancer? Depending on your health history and family history, you may need to have cancer screenings at different stages of your life. This may include screening for: Breast cancer. Cervical cancer. Lung cancer. Colorectal cancer. What is my risk for osteoporosis? After menopause, you may be   at increased risk for osteoporosis. Osteoporosis is a condition in which bone destruction happens more quickly than new bone creation. To help prevent osteoporosis or  the bone fractures that can happen because of osteoporosis, you may take the following actions: If you are 19-50 years old, get at least 1,000 mg of calcium and at least 600 international units (IU) of vitamin D per day. If you are older than age 50 but younger than age 70, get at least 1,200 mg of calcium and at least 600 international units (IU) of vitamin D per day. If you are older than age 70, get at least 1,200 mg of calcium and at least 800 international units (IU) of vitamin D per day. Smoking and drinking excessive alcohol increase the risk of osteoporosis. Eat foods that are rich in calcium and vitamin D, and do weight-bearing exercises several times each week as directed by your health care provider. How does menopause affect my mental health? Depression may occur at any age, but it is more common as you become older. Common symptoms of depression include: Feeling depressed. Changes in sleep patterns. Changes in appetite or eating patterns. Feeling an overall lack of motivation or enjoyment of activities that you previously enjoyed. Frequent crying spells. Talk with your health care provider if you think that you are experiencing any of these symptoms. General instructions See your health care provider for regular wellness exams and vaccines. This may include: Scheduling regular health, dental, and eye exams. Getting and maintaining your vaccines. These include: Influenza vaccine. Get this vaccine each year before the flu season begins. Pneumonia vaccine. Shingles vaccine. Tetanus, diphtheria, and pertussis (Tdap) booster vaccine. Your health care provider may also recommend other immunizations. Tell your health care provider if you have ever been abused or do not feel safe at home. Summary Menopause is a normal process in which your ability to get pregnant comes to an end. This condition causes hot flashes, night sweats, decreased interest in sex, mood swings, headaches, or lack  of sleep. Treatment for this condition may include hormone replacement therapy. Take actions to keep yourself healthy, including exercising regularly, eating a healthy diet, watching your weight, and checking your blood pressure and blood sugar levels. Get screened for cancer and depression. Make sure that you are up to date with all your vaccines. This information is not intended to replace advice given to you by your health care provider. Make sure you discuss any questions you have with your health care provider. Document Revised: 08/17/2020 Document Reviewed: 08/17/2020 Elsevier Patient Education  2024 [employer]  

## 2022-10-26 NOTE — Progress Notes (Signed)
Subjective:    Patient ID: Pamela Romero, female    DOB: [birth year], 72 y.o.   MRN: [id number]  HPI  Patient presents to clinic today for her annual exam.  She reports she has not been doing well lately.  About 2 to 3 months ago she had a severe GI illness that consisted of severe abdominal pain nausea and vomiting.  After that, she reports poor appetite for 10 days.  Since that time she reports daily nausea with vomiting about every 3 to 4 days.  She has chronic abdominal pain but attributes this to chronic constipation.  She reports about every 2 weeks she will disimpact herself.  She reports she has started taking MiraLAX OTC but often throws this up.  She generally feels weak and is unable to do things that she used to be able to do.  She has an upper endoscopy schedule for 11/07/22  Flu: 03/2022 Tetanus: 01/2012 COVID: X 2 Pneumovax: 09/2019 Prevnar 13: 01/2015 Prevnar 20: 09/2021 Shingrix: Never Pap smear: Hysterectomy Mammogram: 11/2019 Bone density: 11/2019 Colon screening: Unsure Vision screening: Annually Dentist: Biannually  Diet: She does not eat much meat.  She tries to consume some fruits and vegetables.  She is not eating any fried foods.  She drinks mostly water. Exercise: None   Review of Systems     Past Medical History:  Diagnosis Date   Abnormal EKG    CAD (coronary artery disease)    non critical, by cardiac cath 2006   Cancer [facility])    endometrial Ca   COPD (chronic obstructive pulmonary disease) (HCC)    Endometrial adenocarcinoma (HCC) 2011   Hyperlipidemia    Hypertension    Osteoarthritis    Right foot, Left knee (Dr. Joice Lofts)   Reflux gastritis    Thyroid disease    tobacco abuse    Vaginal delivery    x 2    Current Outpatient Medications  Medication Sig Dispense Refill   albuterol (PROAIR HFA) 108 (90 Base) MCG/ACT inhaler Inhale 1-2 puffs into the lungs every 6 (six) hours as needed for wheezing or shortness of breath. 6.7 g 1   aspirin  EC 81 MG tablet Take 1 tablet (81 mg total) by mouth daily. Swallow whole. 30 tablet 12   atorvastatin (LIPITOR) 10 MG tablet Take 1 tablet (10 mg total) by mouth daily. 90 tablet 1   docusate sodium (COLACE) 100 MG capsule Take 1 capsule (100 mg total) by mouth at bedtime as needed for mild constipation. 90 capsule 0   esomeprazole (NEXIUM) 40 MG capsule Take 1 capsule (40 mg total) by mouth 2 (two) times daily before a meal. 180 capsule 0   famotidine (PEPCID) 20 MG tablet Take 1 tablet (20 mg total) by mouth at bedtime. (Patient not taking: Reported on 10/24/2022) 90 tablet 1   gabapentin (NEURONTIN) 300 MG capsule Take 1-2 capsules (300-600 mg total) by mouth at bedtime. (Patient taking differently: Take 300-600 mg by mouth at bedtime as needed.) 180 capsule 2   HYDROcodone-acetaminophen (NORCO) 7.5-325 MG tablet Take 1 tablet by mouth every 6 (six) hours as needed for severe pain. Must last 30 days. 105 tablet 0   [START ON 11/20/2022] HYDROcodone-acetaminophen (NORCO) 7.5-325 MG tablet Take 1 tablet by mouth every 6 (six) hours as needed for severe pain. Must last 30 days. 105 tablet 0   [START ON 12/20/2022] HYDROcodone-acetaminophen (NORCO) 7.5-325 MG tablet Take 1 tablet by mouth every 6 (six) hours as needed for severe  pain. Must last 30 days. 105 tablet 0   levothyroxine (SYNTHROID) 125 MCG tablet TAKE 1 TABLET BY MOUTH ONCE DAILY ON AN EMPTY STOMACH. WAIT 30 MINUTES BEFORE TAKING OTHER MEDS. 90 tablet 1   lisinopril-hydrochlorothiazide (ZESTORETIC) 20-12.5 MG tablet TAKE 1 TABLET BY MOUTH ONCE DAILY 90 tablet 1   ondansetron (ZOFRAN-ODT) 4 MG disintegrating tablet Take 1 tablet (4 mg total) by mouth daily as needed for nausea or vomiting. 30 tablet 0   polyethylene glycol powder (GLYCOLAX/MIRALAX) 17 GM/SCOOP powder Take 17 g by mouth daily. 3350 g 1   rizatriptan (MAXALT-MLT) 10 MG disintegrating tablet Take 1 tablet (10 mg total) by mouth as needed for migraine. May repeat in 2 hours if  needed 10 tablet 2   sertraline (ZOLOFT) 50 MG tablet Take 1/2 tab PO x 10 days then increase to 1 tab daily thereafter (Patient not taking: Reported on 10/24/2022) 90 tablet 1   Current Facility-Administered Medications  Medication Dose Route Frequency Provider Last Rate Last Admin   naloxegol oxalate (MOVANTIK) tablet 12.5 mg  12.5 mg Oral Daily Celso Amy, PA-C        No Known Allergies  Family History  Problem Relation Age of Onset   Alcohol abuse Father    Cancer Maternal Aunt        stomach   Alcohol abuse Maternal Aunt    Alcohol abuse Mother    Heart disease Brother        MI age 5- non smoker   Cancer Brother        Pancreatic   Cancer Brother        colon, dx'd age 53   Cancer Brother        stomach, paternal half brother   Breast cancer Neg Hx     Social History   Socioeconomic History   Marital status: Married    Spouse name: Not on file   Number of children: 0   Years of education: Not on file   Highest education level: Not on file  Occupational History   Occupation: retired   Tobacco Use   Smoking status: Former    Current packs/day: 0.00    Average packs/day: 0.5 packs/day for 50.0 years (25.0 ttl pk-yrs)    Types: Cigarettes    Start date: 05/11/1968    Quit date: 05/11/2018    Years since quitting: 4.4   Smokeless tobacco: Former  Building services engineer status: Never Used  Substance and Sexual Activity   Alcohol use: No   Drug use: No   Sexual activity: Not on file  Other Topics Concern   Not on file  Social History Narrative   Lives with spouse, takes care of her mother.   Had 2 children, one died at birth, the other given up for adoption (was unmarried).   Social Determinants of Health   Financial Resource Strain: Low Risk  (04/29/2022)   Overall Financial Resource Strain (CARDIA)    Difficulty of Paying Living Expenses: Not very hard  Food Insecurity: No Food Insecurity (04/29/2022)   Hunger Vital Sign    Worried About Running Out of  Food in the Last Year: Never true    Ran Out of Food in the Last Year: Never true  Transportation Needs: No Transportation Needs (04/29/2022)   PRAPARE - Administrator, Civil Service (Medical): No    Lack of Transportation (Non-Medical): No  Physical Activity: Insufficiently Active (04/29/2022)   Exercise Vital Sign  Days of Exercise per Week: 3 days    Minutes of Exercise per Session: 30 min  Stress: No Stress Concern Present (04/29/2022)   [employer] of Occupational Health - Occupational Stress Questionnaire    Feeling of Stress : Only a little  Social Connections: Moderately Integrated (04/29/2022)   Social Connection and Isolation Panel [NHANES]    Frequency of Communication with Friends and Family: Three times a week    Frequency of Social Gatherings with Friends and Family: Twice a week    Attends Religious Services: More than 4 times per year    Active Member of [employer] or Organizations: No    Attends Banker Meetings: Never    Marital Status: Married  Catering manager Violence: Not At Risk (04/29/2022)   Humiliation, Afraid, Rape, and Kick questionnaire    Fear of Current or Ex-Partner: No    Emotionally Abused: No    Physically Abused: No    Sexually Abused: No     Constitutional: Patient reports intermittent headaches, fatigue, unintentional weight loss..  Denies fever, malaise, fatigue.  HEENT: Denies eye pain, eye redness, ear pain, ringing in the ears, wax buildup, runny nose, nasal congestion, bloody nose, or sore throat. Respiratory: Denies difficulty breathing, shortness of breath, cough or sputum production.   Cardiovascular: Denies chest pain, chest tightness, palpitations or swelling in the hands or feet.  Gastrointestinal: Patient reports nausea and vomiting, constipation.  Denies abdominal pain, bloating, diarrhea or blood in the stool.  GU: Denies urgency, frequency, pain with urination, burning sensation, blood in urine, odor or  discharge. Musculoskeletal: Patient reports chronic joint pain, generalized weakness.  Denies decrease in range of motion, muscle pain or joint swelling.  Skin: Denies redness, rashes, lesions or ulcercations.  Neurological: Patient reports insomnia, intermittent dizziness.  Denies difficulty with memory, difficulty with speech or problems with balance and coordination.  Psych: Patient has a history of anxiety and depression.  Denies SI/HI.  No other specific complaints in a complete review of systems (except as listed in HPI above).  Objective:   Physical Exam  BP (!) 91/59 (BP Location: Left Arm, Patient Position: Sitting, Cuff Size: Normal)   Pulse (!) 104   Temp (!) 96.2 F (35.7 C) (Temporal)   SpO2 97%   Wt Readings from Last 3 Encounters:  10/11/22 196 lb (88.9 kg)  09/01/22 206 lb (93.4 kg)  08/29/22 198 lb (89.8 kg)    General: Appears her stated age, chronically ill-appearing.  She does appear acutely unwell but has been adamant that she is not going to the hospital. Skin: Warm, dry and intact.  Pallor noted. HEENT: Head: normal shape and size; Eyes: sclera white, no icterus, conjunctiva pink, PERRLA and EOMs intact;  Neck:  Neck supple, trachea midline. No masses, lumps present.  Cardiovascular: Tachycardic with normal rhythm. S1,S2 noted.  No murmur, rubs or gallops noted. No JVD or BLE edema. No carotid bruits noted. Pulmonary/Chest: Normal effort and positive vesicular breath sounds. No respiratory distress. No wheezes, rales or ronchi noted.  Abdomen: Soft and generally tender. Normal bowel sounds. No distention or masses noted. Liver, spleen and kidneys non palpable. Musculoskeletal: Pain with palpation of BLE. In wheelchair, but able to stand with assistance.  No difficulty with gait.  Neurological: Alert and oriented. Cranial nerves II-XII grossly intact. Coordination normal.  Psychiatric: She is extremely anxious appearing. Judgment and thought content normal.      BMET    Component Value Date/Time   NA 142 09/01/2022  1017   K 4.5 09/01/2022 1017   CL 104 09/01/2022 1017   CO2 25 09/01/2022 1017   GLUCOSE 92 09/01/2022 1017   GLUCOSE 78 04/08/2022 1130   BUN 22 09/01/2022 1017   CREATININE 1.14 (H) 09/01/2022 1017   CREATININE 1.13 (H) 04/08/2022 1130   CALCIUM 9.9 09/01/2022 1017   GFRNONAA >60 04/07/2020 1022   GFRNONAA 52 (L) 01/02/2020 1453   GFRAA 61 01/02/2020 1453    Lipid Panel     Component Value Date/Time   CHOL 149 04/08/2022 1130   TRIG 92 04/08/2022 1130   HDL 46 (L) 04/08/2022 1130   CHOLHDL 3.2 04/08/2022 1130   VLDL 42.0 (H) 05/14/2014 0918   LDLCALC 84 04/08/2022 1130    CBC    Component Value Date/Time   WBC 6.8 09/01/2022 1017   WBC 5.4 04/08/2022 1130   RBC 3.35 (L) 09/01/2022 1017   RBC 3.17 (L) 04/08/2022 1130   HGB 10.1 (L) 09/01/2022 1017   HCT 31.4 (L) 09/01/2022 1017   PLT 134 (L) 04/08/2022 1130   MCV 94 09/01/2022 1017   MCH 30.1 09/01/2022 1017   MCH 32.5 04/08/2022 1130   MCHC 32.2 09/01/2022 1017   MCHC 33.6 04/08/2022 1130   RDW 13.3 09/01/2022 1017   LYMPHSABS 1.7 09/01/2022 1017   MONOABS 0.4 09/16/2013 1046   EOSABS 0.1 09/01/2022 1017   BASOSABS 0.1 09/01/2022 1017    Hgb A1C Lab Results  Component Value Date   HGBA1C 4.9 10/05/2021           Assessment & Plan:   Preventative health maintenance:  Encouraged her to get a flu shot in the fall She declines tetanus for financial reasons, advised her if she gets bit or cut to go get this done Pneumonia vaccines UTD Encouraged her to get her COVID booster Discussed Shingrix vaccine, she will check coverage with her insurance company and schedule visit if she would like to have this done She no longer needs to screen for cervical cancer Mammogram and bone density have already been scheduled She will be seeing GI soon for a UGI, hopefully colonoscopy Encouraged her to consume a balanced diet and exercise  regimen Advised her to see an eye doctor and dentist annually We will check CBC, c-Met, TSH, free T4, lipid, A1c today  Generalized abdominal pain, nausea, vomiting, chronic constipation, unintentional weight loss:  Will check stat CBC and c-Met today Will obtain stat CT abdomen/pelvis contrast Advised her that she appears unwell and that my thought is is that she needs to go to the ER for further evaluation however she would like to try to get blood results and CT abdomen/pelvis results prior to making that decision ER precautions discussed  Multiple joint pain:  Will check ANA, ESR, CRP and rheumatoid factor  RTC in 6 months, follow-up chronic conditions Nicki Reaper, NP

## 2022-10-27 ENCOUNTER — Ambulatory Visit: Payer: Medicare HMO

## 2022-10-27 ENCOUNTER — Encounter: Payer: Medicare HMO | Admitting: Internal Medicine

## 2022-10-28 ENCOUNTER — Other Ambulatory Visit: Payer: Self-pay

## 2022-10-28 ENCOUNTER — Other Ambulatory Visit: Payer: Medicare HMO

## 2022-10-28 ENCOUNTER — Ambulatory Visit: Admission: RE | Admit: 2022-10-28 | Payer: Medicare HMO | Source: Ambulatory Visit

## 2022-10-28 DIAGNOSIS — E890 Postprocedural hypothyroidism: Secondary | ICD-10-CM

## 2022-10-28 DIAGNOSIS — R7309 Other abnormal glucose: Secondary | ICD-10-CM

## 2022-10-28 DIAGNOSIS — E785 Hyperlipidemia, unspecified: Secondary | ICD-10-CM | POA: Diagnosis not present

## 2022-10-28 DIAGNOSIS — R531 Weakness: Secondary | ICD-10-CM

## 2022-10-28 DIAGNOSIS — M2550 Pain in unspecified joint: Secondary | ICD-10-CM

## 2022-10-28 DIAGNOSIS — R109 Unspecified abdominal pain: Secondary | ICD-10-CM

## 2022-10-28 DIAGNOSIS — R112 Nausea with vomiting, unspecified: Secondary | ICD-10-CM

## 2022-10-28 DIAGNOSIS — Z0001 Encounter for general adult medical examination with abnormal findings: Secondary | ICD-10-CM

## 2022-10-28 DIAGNOSIS — K5909 Other constipation: Secondary | ICD-10-CM

## 2022-10-28 LAB — CBC WITH DIFFERENTIAL/PLATELET
Eosinophils Relative: 0.9 %
Hemoglobin: 10.6 g/dL — ABNORMAL LOW (ref 11.7–15.5)
Lymphs Abs: 2125 cells/uL (ref 850–3900)
MCHC: 31.6 g/dL — ABNORMAL LOW (ref 32.0–36.0)
MCV: 96.5 fL (ref 80.0–100.0)
MPV: 10.4 fL (ref 7.5–12.5)
Total Lymphocyte: 32.2 %
WBC: 6.6 10*3/uL (ref 3.8–10.8)

## 2022-10-29 LAB — COMPREHENSIVE METABOLIC PANEL
AG Ratio: 1.5 (calc) (ref 1.0–2.5)
ALT: 4 U/L — ABNORMAL LOW (ref 6–29)
AST: 11 U/L (ref 10–35)
Albumin: 3.3 g/dL — ABNORMAL LOW (ref 3.6–5.1)
Alkaline phosphatase (APISO): 63 U/L (ref 37–153)
BUN/Creatinine Ratio: 17 (calc) (ref 6–22)
BUN: 17 mg/dL (ref 7–25)
CO2: 29 mmol/L (ref 20–32)
Calcium: 9.6 mg/dL (ref 8.6–10.4)
Chloride: 102 mmol/L (ref 98–110)
Creat: 1.01 mg/dL — ABNORMAL HIGH (ref 0.60–1.00)
Globulin: 2.2 g/dL (calc) (ref 1.9–3.7)
Glucose, Bld: 94 mg/dL (ref 65–99)
Potassium: 3.1 mmol/L — ABNORMAL LOW (ref 3.5–5.3)
Sodium: 141 mmol/L (ref 135–146)
Total Bilirubin: 0.4 mg/dL (ref 0.2–1.2)
Total Protein: 5.5 g/dL — ABNORMAL LOW (ref 6.1–8.1)

## 2022-10-29 LAB — LIPID PANEL
Cholesterol: 125 mg/dL (ref <200)
HDL: 43 mg/dL — ABNORMAL LOW (ref >=50)
LDL Cholesterol (Calc): 61 mg/dL (calc)
Non-HDL Cholesterol (Calc): 82 mg/dL (calc) (ref <130)
Total CHOL/HDL Ratio: 2.9 (calc) (ref <5.0)
Triglycerides: 124 mg/dL (ref <150)

## 2022-10-29 LAB — CBC WITH DIFFERENTIAL/PLATELET
Absolute Monocytes: 442 cells/uL (ref 200–950)
Basophils Absolute: 40 cells/uL (ref 0–200)
Basophils Relative: 0.6 %
Eosinophils Absolute: 59 cells/uL (ref 15–500)
HCT: 33.5 % — ABNORMAL LOW (ref 35.0–45.0)
MCH: 30.5 pg (ref 27.0–33.0)
Monocytes Relative: 6.7 %
Neutro Abs: 3934 cells/uL (ref 1500–7800)
Neutrophils Relative %: 59.6 %
Platelets: 180 10*3/uL (ref 140–400)
RBC: 3.47 10*6/uL — ABNORMAL LOW (ref 3.80–5.10)
RDW: 13.1 % (ref 11.0–15.0)

## 2022-10-29 LAB — TSH: TSH: 0.07 mIU/L — ABNORMAL LOW (ref 0.40–4.50)

## 2022-10-31 ENCOUNTER — Telehealth: Payer: Self-pay

## 2022-10-31 ENCOUNTER — Telehealth: Payer: Self-pay | Admitting: Physician Assistant

## 2022-10-31 ENCOUNTER — Ambulatory Visit: Payer: Medicare HMO | Admitting: Gastroenterology

## 2022-10-31 MED ORDER — LEVOTHYROXINE SODIUM 112 MCG PO TABS: 112.0000 ug | ORAL_TABLET | Freq: Every day | ORAL | 3 refills | Status: DC

## 2022-10-31 NOTE — Telephone Encounter (Signed)
Patient husband Jomarie Longs) called in because he got a call and he wanted to know if his wife needs to keep her appointment for today. I informed him that she doesn't need to keep her appointment.  He wanted to confirm her surgery date and time.

## 2022-10-31 NOTE — Progress Notes (Deleted)
Wyline Mood MD, MRCP(U.K) [address]  [address]  [place], [place] [address]  Main: [phone]  Fax: [phone]   Primary Care Physician: Lorre Munroe, NP  Primary Gastroenterologist:  Dr. Wyline Mood   No chief complaint on file.   HPI: Pamela Romero is a 72 y.o. female   Summary of history :    Interval history  09/01/2022-10/31/2022  ***   Current Outpatient Medications  Medication Sig Dispense Refill   albuterol (PROAIR HFA) 108 (90 Base) MCG/ACT inhaler Inhale 1-2 puffs into the lungs every 6 (six) hours as needed for wheezing or shortness of breath. 6.7 g 1   aspirin EC 81 MG tablet Take 1 tablet (81 mg total) by mouth daily. Swallow whole. 30 tablet 12   atorvastatin (LIPITOR) 10 MG tablet Take 1 tablet (10 mg total) by mouth daily. 90 tablet 1   docusate sodium (COLACE) 100 MG capsule Take 1 capsule (100 mg total) by mouth at bedtime as needed for mild constipation. 90 capsule 0   esomeprazole (NEXIUM) 40 MG capsule Take 1 capsule (40 mg total) by mouth 2 (two) times daily before a meal. 180 capsule 0   famotidine (PEPCID) 20 MG tablet Take 1 tablet (20 mg total) by mouth at bedtime. 90 tablet 1   gabapentin (NEURONTIN) 300 MG capsule Take 1-2 capsules (300-600 mg total) by mouth at bedtime. (Patient taking differently: Take 300-600 mg by mouth at bedtime as needed.) 180 capsule 2   HYDROcodone-acetaminophen (NORCO) 7.5-325 MG tablet Take 1 tablet by mouth every 6 (six) hours as needed for severe pain. Must last 30 days. 105 tablet 0   [START ON 11/20/2022] HYDROcodone-acetaminophen (NORCO) 7.5-325 MG tablet Take 1 tablet by mouth every 6 (six) hours as needed for severe pain. Must last 30 days. 105 tablet 0   [START ON 12/20/2022] HYDROcodone-acetaminophen (NORCO) 7.5-325 MG tablet Take 1 tablet by mouth every 6 (six) hours as needed for severe pain. Must last 30 days. 105 tablet 0   levothyroxine (SYNTHROID) 125 MCG tablet TAKE 1 TABLET BY MOUTH ONCE  DAILY ON AN EMPTY STOMACH. WAIT 30 MINUTES BEFORE TAKING OTHER MEDS. 90 tablet 1   lisinopril-hydrochlorothiazide (ZESTORETIC) 20-12.5 MG tablet TAKE 1 TABLET BY MOUTH ONCE DAILY 90 tablet 1   ondansetron (ZOFRAN-ODT) 4 MG disintegrating tablet Take 1 tablet (4 mg total) by mouth daily as needed for nausea or vomiting. 30 tablet 0   polyethylene glycol powder (GLYCOLAX/MIRALAX) 17 GM/SCOOP powder Take 17 g by mouth daily. 3350 g 1   rizatriptan (MAXALT-MLT) 10 MG disintegrating tablet Take 1 tablet (10 mg total) by mouth as needed for migraine. May repeat in 2 hours if needed 10 tablet 2   sertraline (ZOLOFT) 50 MG tablet Take 1/2 tab PO x 10 days then increase to 1 tab daily thereafter 90 tablet 1   Current Facility-Administered Medications  Medication Dose Route Frequency Provider Last Rate Last Admin   naloxegol oxalate (MOVANTIK) tablet 12.5 mg  12.5 mg Oral Daily Celso Amy, PA-C        Allergies as of 10/31/2022   (No Known Allergies)       Interval history   ***/***/202*   ***/***/2024   ROS:  General: Negative for anorexia, weight loss, fever, chills, fatigue, weakness. ENT: Negative for hoarseness, difficulty swallowing , nasal congestion. CV: Negative for chest pain, angina, palpitations, dyspnea on exertion, peripheral edema.  Respiratory: Negative for dyspnea at rest, dyspnea on exertion, cough, sputum, wheezing.  GI: See history of present illness. GU:  Negative for dysuria, hematuria, urinary incontinence, urinary frequency, nocturnal urination.  Endo: Negative for unusual weight change.    Physical Examination:   There were no vitals taken for this visit.  General: Well-nourished, well-developed in no acute distress.  Eyes: No icterus. Conjunctivae pink. Mouth: Oropharyngeal mucosa moist and pink , no lesions erythema or exudate. Lungs: Clear to auscultation bilaterally. Non-labored. Heart: Regular rate and rhythm, no murmurs rubs or gallops.  Abdomen: Bowel  sounds are normal, nontender, nondistended, no hepatosplenomegaly or masses, no abdominal bruits or hernia , no rebound or guarding.   Extremities: No lower extremity edema. No clubbing or deformities. Neuro: Alert and oriented x 3.  Grossly intact. Skin: Warm and dry, no jaundice.   Psych: Alert and cooperative, normal mood and affect.   Imaging Studies: No results found.  Assessment and Plan:   Pamela Romero is a 72 y.o. y/o female ***    Dr Wyline Mood  MD,MRCP [facility]) Follow up in ***  BP check ***

## 2022-10-31 NOTE — Telephone Encounter (Signed)
Patient aware of results.  Levothyroxine sent to pharmacy.

## 2022-10-31 NOTE — Telephone Encounter (Signed)
-----   Message from [facility] sent at 10/29/2022  7:53 AM EDT ----- TSH is low.  We need to cut back on her levothyroxine to 112 mcg daily.  Please let me know if she is agreeable and I will send this in.  Cholesterol looks good.  Kidney function is normal.  Potassium is low which could be due to all the nausea and vomiting.  Have her increase potassium containing foods in her diet.  She has a persistent anemia that has not worsened but I would recommend she take oral iron 325 mg daily.

## 2022-11-04 ENCOUNTER — Encounter: Payer: Self-pay | Admitting: Gastroenterology

## 2022-11-07 ENCOUNTER — Ambulatory Visit: Payer: Medicare HMO | Admitting: Anesthesiology

## 2022-11-07 ENCOUNTER — Ambulatory Visit
Admission: RE | Admit: 2022-11-07 | Discharge: 2022-11-07 | Disposition: A | Discharge status: Home / Self Care | Payer: Medicare HMO | Source: Ambulatory Visit | Attending: Gastroenterology | Admitting: Gastroenterology

## 2022-11-07 ENCOUNTER — Encounter
Admission: RE | Disposition: A | Discharge status: Home / Self Care | Payer: Self-pay | Source: Ambulatory Visit | Attending: Gastroenterology

## 2022-11-07 ENCOUNTER — Encounter: Payer: Self-pay | Admitting: Gastroenterology

## 2022-11-07 DIAGNOSIS — Z79899 Other long term (current) drug therapy: Secondary | ICD-10-CM | POA: Insufficient documentation

## 2022-11-07 DIAGNOSIS — I890 Lymphedema, not elsewhere classified: Secondary | ICD-10-CM | POA: Insufficient documentation

## 2022-11-07 DIAGNOSIS — R112 Nausea with vomiting, unspecified: Secondary | ICD-10-CM

## 2022-11-07 DIAGNOSIS — E039 Hypothyroidism, unspecified: Secondary | ICD-10-CM | POA: Insufficient documentation

## 2022-11-07 DIAGNOSIS — I10 Essential (primary) hypertension: Secondary | ICD-10-CM | POA: Diagnosis not present

## 2022-11-07 DIAGNOSIS — I739 Peripheral vascular disease, unspecified: Secondary | ICD-10-CM | POA: Insufficient documentation

## 2022-11-07 DIAGNOSIS — I2510 Atherosclerotic heart disease of native coronary artery without angina pectoris: Secondary | ICD-10-CM | POA: Diagnosis not present

## 2022-11-07 DIAGNOSIS — K222 Esophageal obstruction: Secondary | ICD-10-CM | POA: Insufficient documentation

## 2022-11-07 DIAGNOSIS — F32A Depression, unspecified: Secondary | ICD-10-CM | POA: Insufficient documentation

## 2022-11-07 DIAGNOSIS — Z8249 Family history of ischemic heart disease and other diseases of the circulatory system: Secondary | ICD-10-CM | POA: Diagnosis not present

## 2022-11-07 DIAGNOSIS — I129 Hypertensive chronic kidney disease with stage 1 through stage 4 chronic kidney disease, or unspecified chronic kidney disease: Secondary | ICD-10-CM | POA: Diagnosis not present

## 2022-11-07 DIAGNOSIS — K219 Gastro-esophageal reflux disease without esophagitis: Secondary | ICD-10-CM | POA: Diagnosis not present

## 2022-11-07 DIAGNOSIS — Z87891 Personal history of nicotine dependence: Secondary | ICD-10-CM | POA: Insufficient documentation

## 2022-11-07 DIAGNOSIS — K3189 Other diseases of stomach and duodenum: Secondary | ICD-10-CM | POA: Diagnosis not present

## 2022-11-07 DIAGNOSIS — R1310 Dysphagia, unspecified: Secondary | ICD-10-CM | POA: Diagnosis present

## 2022-11-07 DIAGNOSIS — N189 Chronic kidney disease, unspecified: Secondary | ICD-10-CM | POA: Diagnosis not present

## 2022-11-07 DIAGNOSIS — K449 Diaphragmatic hernia without obstruction or gangrene: Secondary | ICD-10-CM | POA: Insufficient documentation

## 2022-11-07 DIAGNOSIS — J449 Chronic obstructive pulmonary disease, unspecified: Secondary | ICD-10-CM | POA: Diagnosis not present

## 2022-11-07 DIAGNOSIS — K2289 Other specified disease of esophagus: Secondary | ICD-10-CM | POA: Diagnosis not present

## 2022-11-07 HISTORY — PX: ESOPHAGOGASTRODUODENOSCOPY (EGD) WITH PROPOFOL: SHX5813

## 2022-11-07 HISTORY — PX: POLYPECTOMY: SHX5525

## 2022-11-07 HISTORY — PX: BIOPSY: SHX5522

## 2022-11-07 HISTORY — PX: BALLOON DILATION: SHX5330

## 2022-11-07 SURGERY — ESOPHAGOGASTRODUODENOSCOPY (EGD) WITH PROPOFOL
Anesthesia: General

## 2022-11-07 MED ORDER — GLYCOPYRROLATE 0.2 MG/ML IJ SOLN
INTRAMUSCULAR | Status: AC
  Filled 2022-11-07: qty 1

## 2022-11-07 MED ORDER — PROPOFOL 10 MG/ML IV BOLUS
INTRAVENOUS | Status: DC | PRN
Start: 2022-11-07 — End: 2022-11-07
  Administered 2022-11-07: 80 mg via INTRAVENOUS

## 2022-11-07 MED ORDER — DEXMEDETOMIDINE HCL IN NACL 80 MCG/20ML IV SOLN
INTRAVENOUS | Status: AC
  Filled 2022-11-07: qty 20

## 2022-11-07 MED ORDER — LIDOCAINE HCL (CARDIAC) PF 100 MG/5ML IV SOSY
PREFILLED_SYRINGE | INTRAVENOUS | Status: DC | PRN
  Administered 2022-11-07: 50 mg via INTRAVENOUS

## 2022-11-07 MED ORDER — LIDOCAINE HCL (PF) 2 % IJ SOLN
INTRAMUSCULAR | Status: AC
  Filled 2022-11-07: qty 5

## 2022-11-07 MED ORDER — SODIUM CHLORIDE 0.9 % IV SOLN: INTRAVENOUS | Status: DC

## 2022-11-07 MED ORDER — DEXMEDETOMIDINE HCL IN NACL 80 MCG/20ML IV SOLN
INTRAVENOUS | Status: DC | PRN
  Administered 2022-11-07: 16 ug via INTRAVENOUS

## 2022-11-07 MED ORDER — PROPOFOL 1000 MG/100ML IV EMUL
INTRAVENOUS | Status: AC
  Filled 2022-11-07: qty 100

## 2022-11-07 MED ORDER — PROPOFOL 500 MG/50ML IV EMUL
INTRAVENOUS | Status: DC | PRN
  Administered 2022-11-07: 125 ug/kg/min via INTRAVENOUS

## 2022-11-07 NOTE — Op Note (Signed)
[facility] Patient Name: Pamela Romero Procedure Date: 11/07/2022 8:35 AM MRN: [id number] Account #: [id number] Date of Birth: [birth year] Admit Type: Outpatient Age: 72 Room: [facility] ENDO ROOM 1 Gender: Female Note Status: Finalized Instrument Name: Upper Endoscope [id number] Procedure:             Upper GI endoscopy Indications:           Dysphagia Providers:             Wyline Mood MD, MD Referring MD:          Lorre Munroe (Referring MD) Medicines:             Monitored Anesthesia Care Complications:         No immediate complications. Procedure:             Pre-Anesthesia Assessment:                        - Prior to the procedure, a History and Physical was                         performed, and patient medications, allergies and                         sensitivities were reviewed. The patient's tolerance                         of previous anesthesia was reviewed.                        - The risks and benefits of the procedure and the                         sedation options and risks were discussed with the                         patient. All questions were answered and informed                         consent was obtained.                        - ASA Grade Assessment: II - A patient with mild                         systemic disease.                        After obtaining informed consent, the endoscope was                         passed under direct vision. Throughout the procedure,                         the patient's blood pressure, pulse, and oxygen                         saturations were monitored continuously. The Endoscope                         was introduced through  the mouth, and advanced to the                         third part of duodenum. The upper GI endoscopy was                         accomplished with ease. The patient tolerated the                         procedure well. Findings:      One benign-appearing,  intrinsic moderate stenosis was found at the       gastroesophageal junction. This stenosis measured 1.5 cm (inner       diameter). The stenosis was traversed. A TTS dilator was passed through       the scope. Dilation with a 15-16.5-18 mm balloon dilator was performed       to 18 mm. This was biopsied with a cold forceps for histology.      The gastroesophageal flap valve was visualized endoscopically and       classified as Hill Grade IV (no fold, wide open lumen, hiatal hernia       present).      Lymphangiectasia was present in the third portion of the duodenum.       Biopsies were taken with a cold forceps for histology.      The cardia and gastric fundus were normal on retroflexion. Impression:            - Benign-appearing esophageal stenosis. Dilated.                         Biopsied.                        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                        - Duodenal mucosal lymphangiectasia. Recommendation:        - Await pathology results.                        - Discharge patient to home (with escort).                        - Resume previous diet.                        - Continue present medications.                        - Await pathology results.                        - Return to GI clinic as previously scheduled. Procedure Code(s):     --- Professional ---                        [phone], Esophagogastroduodenoscopy, flexible,  transoral; with transendoscopic balloon dilation of                         esophagus (less than 30 mm diameter)                        43239, 59, Esophagogastroduodenoscopy, flexible,                         transoral; with biopsy, single or multiple Diagnosis Code(s):     --- Professional ---                        K22.2, Esophageal obstruction                        I89.0, Lymphedema, not elsewhere classified                         K44.9, Diaphragmatic hernia without obstruction or                         gangrene                        R13.10, Dysphagia, unspecified CPT copyright 2022 American Medical Association. All rights reserved. The codes documented in this report are preliminary and upon coder review may  be revised to meet current compliance requirements. Wyline Mood, MD Wyline Mood MD, MD 11/07/2022 8:53:52 AM This report has been signed electronically. Number of Addenda: 0 Note Initiated On: 11/07/2022 8:35 AM Estimated Blood Loss:  Estimated blood loss: none.      [facility]

## 2022-11-07 NOTE — Transfer of Care (Signed)
Immediate Anesthesia Transfer of Care Note  Patient: Pamela Romero  Procedure(s) Performed: ESOPHAGOGASTRODUODENOSCOPY (EGD) WITH PROPOFOL BIOPSY BALLOON DILATION POLYPECTOMY  Patient Location: PACU  Anesthesia Type:General  Level of Consciousness: sedated and responds to stimulation  Airway & Oxygen Therapy: Patient Spontanous Breathing  Post-op Assessment: Report given to RN and Post -op Vital signs reviewed and stable  Post vital signs: Reviewed and stable  Last Vitals:  Vitals Value Taken Time  BP 81/59 11/07/22 0856  Temp 36.1 C 11/07/22 0855  Pulse 89 11/07/22 0856  Resp 21 11/07/22 0856  SpO2 97 % 11/07/22 0856  Vitals shown include unfiled device data.  Last Pain:  Vitals:   11/07/22 0855  TempSrc: Temporal  PainSc: Asleep         Complications: No notable events documented.

## 2022-11-07 NOTE — Anesthesia Preprocedure Evaluation (Signed)
Anesthesia Evaluation  Patient identified by MRN, date of birth, ID band Patient awake    Reviewed: Allergy & Precautions, NPO status , Patient's Chart, lab work & pertinent test results  Airway Mallampati: II  TM Distance: >3 FB Neck ROM: full    Dental  (+) Edentulous Upper, Edentulous Lower   Pulmonary neg pulmonary ROS, COPD, Patient abstained from smoking., former smoker   Pulmonary exam normal  + decreased breath sounds      Cardiovascular Exercise Tolerance: Poor hypertension, Pt. on medications + CAD and + Peripheral Vascular Disease  negative cardio ROS Normal cardiovascular exam Rhythm:Regular Rate:Normal     Neuro/Psych  Headaches   Depression    negative neurological ROS  negative psych ROS   GI/Hepatic negative GI ROS, Neg liver ROS,GERD  Medicated,,  Endo/Other  negative endocrine ROSHypothyroidism    Renal/GU CRFRenal diseasenegative Renal ROS  negative genitourinary   Musculoskeletal   Abdominal Normal abdominal exam  (+)   Peds negative pediatric ROS (+)  Hematology negative hematology ROS (+)   Anesthesia Other Findings Past Medical History: No date: Abnormal EKG No date: CAD (coronary artery disease)     Comment:  non critical, by cardiac cath 2006 No date: Cancer [facility])     Comment:  endometrial Ca No date: COPD (chronic obstructive pulmonary disease) (HCC) 2011: Endometrial adenocarcinoma (HCC) No date: Hyperlipidemia No date: Hypertension No date: Osteoarthritis     Comment:  Right foot, Left knee (Dr. Joice Lofts) No date: Reflux gastritis No date: Thyroid disease No date: tobacco abuse No date: Vaginal delivery     Comment:  x 2  Past Surgical History: 2014: BREAST CYST ASPIRATION; Right     Comment:  neg March 2014: BREAST SURGERY; Right     Comment:  benign Byrnett 2006: CARDIAC CATHETERIZATION     Comment:  40% LAD, EF 60% 04/07/2020: ESOPHAGOGASTRODUODENOSCOPY; N/A      Comment:  Procedure: ESOPHAGOGASTRODUODENOSCOPY (EGD);  Surgeon:               Wyline Mood, MD;  Location: [facility] ENDOSCOPY;  Service:               Gastroenterology;  Laterality: N/A; 2005: THYROIDECTOMY     Comment:  partial at [facility], non malignant tumor No date: TOTAL ABDOMINAL HYSTERECTOMY     Comment:  with BSO     Reproductive/Obstetrics negative OB ROS                             Anesthesia Physical Anesthesia Plan  ASA: 3  Anesthesia Plan: General   Post-op Pain Management:    Induction: Intravenous  PONV Risk Score and Plan: Propofol infusion and TIVA  Airway Management Planned: Natural Airway  Additional Equipment:   Intra-op Plan:   Post-operative Plan:   Informed Consent: I have reviewed the patients History and Physical, chart, labs and discussed the procedure including the risks, benefits and alternatives for the proposed anesthesia with the patient or authorized representative who has indicated his/her understanding and acceptance.     Dental Advisory Given  Plan Discussed with: CRNA and Surgeon  Anesthesia Plan Comments:        Anesthesia Quick Evaluation

## 2022-11-07 NOTE — H&P (Signed)
Wyline Mood, MD [address], [address], [place], [place], [address] [address], [place], [place], [address] Phone: [phone]  Fax: [phone]  Primary Care Physician:  Lorre Munroe, NP   Pre-Procedure History & Physical: HPI:  Pamela Romero is a 72 y.o. female is here for an endoscopy    Past Medical History:  Diagnosis Date   Abnormal EKG    CAD (coronary artery disease)    non critical, by cardiac cath 2006   Cancer [facility])    endometrial Ca   COPD (chronic obstructive pulmonary disease) (HCC)    Endometrial adenocarcinoma (HCC) 2011   Hyperlipidemia    Hypertension    Osteoarthritis    Right foot, Left knee (Dr. Joice Lofts)   Reflux gastritis    Thyroid disease    tobacco abuse    Vaginal delivery    x 2    Past Surgical History:  Procedure Laterality Date   BREAST CYST ASPIRATION Right 2014   neg   BREAST SURGERY Right March 2014   benign Byrnett   CARDIAC CATHETERIZATION  2006   40% LAD, EF 60%   ESOPHAGOGASTRODUODENOSCOPY N/A 04/07/2020   Procedure: ESOPHAGOGASTRODUODENOSCOPY (EGD);  Surgeon: Wyline Mood, MD;  Location: [facility] ENDOSCOPY;  Service: Gastroenterology;  Laterality: N/A;   THYROIDECTOMY  2005   partial at [facility], non malignant tumor   TOTAL ABDOMINAL HYSTERECTOMY     with BSO    Prior to Admission medications   Medication Sig Start Date End Date Taking? Authorizing Provider  aspirin EC 81 MG tablet Take 1 tablet (81 mg total) by mouth daily. Swallow whole. 10/05/21  Yes Baity, Salvadore Oxford, NP  atorvastatin (LIPITOR) 10 MG tablet Take 1 tablet (10 mg total) by mouth daily. 08/16/22  Yes Baity, Salvadore Oxford, NP  docusate sodium (COLACE) 100 MG capsule Take 1 capsule (100 mg total) by mouth at bedtime as needed for mild constipation. 03/09/22  Yes Lorre Munroe, NP  esomeprazole (NEXIUM) 40 MG capsule Take 1 capsule (40 mg total) by mouth 2 (two) times daily before a meal. 09/15/21  Yes Baity, Salvadore Oxford, NP  gabapentin (NEURONTIN) 300 MG  capsule Take 1-2 capsules (300-600 mg total) by mouth at bedtime. Patient taking differently: Take 300-600 mg by mouth at bedtime as needed. 07/12/22 04/08/23 Yes Edward Jolly, MD  HYDROcodone-acetaminophen (NORCO) 7.5-325 MG tablet Take 1 tablet by mouth every 6 (six) hours as needed for severe pain. Must last 30 days. 10/21/22 11/20/22 Yes Edward Jolly, MD  levothyroxine (SYNTHROID) 112 MCG tablet Take 1 tablet (112 mcg total) by mouth daily. 10/31/22  Yes Lorre Munroe, NP  lisinopril-hydrochlorothiazide (ZESTORETIC) 20-12.5 MG tablet TAKE 1 TABLET BY MOUTH ONCE DAILY 05/24/22  Yes Lorre Munroe, NP  ondansetron (ZOFRAN-ODT) 4 MG disintegrating tablet Take 1 tablet (4 mg total) by mouth daily as needed for nausea or vomiting. 03/09/22  Yes Baity, Salvadore Oxford, NP  polyethylene glycol powder (GLYCOLAX/MIRALAX) 17 GM/SCOOP powder Take 17 g by mouth daily. 03/09/22  Yes Lorre Munroe, NP  sertraline (ZOLOFT) 50 MG tablet Take 1/2 tab PO x 10 days then increase to 1 tab daily thereafter 09/15/21  Yes Baity, Salvadore Oxford, NP  albuterol (PROAIR HFA) 108 (90 Base) MCG/ACT inhaler Inhale 1-2 puffs into the lungs every 6 (six) hours as needed for wheezing or shortness of breath. 04/14/22   Lorre Munroe, NP  famotidine (PEPCID) 20 MG tablet Take 1 tablet (20 mg total) by mouth  at bedtime. 12/09/21   Lorre Munroe, NP  HYDROcodone-acetaminophen (NORCO) 7.5-325 MG tablet Take 1 tablet by mouth every 6 (six) hours as needed for severe pain. Must last 30 days. 11/20/22 12/20/22  Edward Jolly, MD  HYDROcodone-acetaminophen (NORCO) 7.5-325 MG tablet Take 1 tablet by mouth every 6 (six) hours as needed for severe pain. Must last 30 days. 12/20/22 01/19/23  Edward Jolly, MD  rizatriptan (MAXALT-MLT) 10 MG disintegrating tablet Take 1 tablet (10 mg total) by mouth as needed for migraine. May repeat in 2 hours if needed 10/26/21   Smitty Cords, DO    Allergies as of 09/01/2022   (No Known Allergies)    Family  History  Problem Relation Age of Onset   Alcohol abuse Father    Cancer Maternal Aunt        stomach   Alcohol abuse Maternal Aunt    Alcohol abuse Mother    Heart disease Brother        MI age 27- non smoker   Cancer Brother        Pancreatic   Cancer Brother        colon, dx'd age 64   Cancer Brother        stomach, paternal half brother   Breast cancer Neg Hx     Social History   Socioeconomic History   Marital status: Married    Spouse name: Not on file   Number of children: 0   Years of education: Not on file   Highest education level: Not on file  Occupational History   Occupation: retired   Tobacco Use   Smoking status: Former    Current packs/day: 0.00    Average packs/day: 0.5 packs/day for 50.0 years (25.0 ttl pk-yrs)    Types: Cigarettes    Start date: 05/11/1968    Quit date: 05/11/2018    Years since quitting: 4.4   Smokeless tobacco: Former  Building services engineer status: Never Used  Substance and Sexual Activity   Alcohol use: No   Drug use: No   Sexual activity: Not on file  Other Topics Concern   Not on file  Social History Narrative   Lives with spouse, takes care of her mother.   Had 2 children, one died at birth, the other given up for adoption (was unmarried).   Social Determinants of Health   Financial Resource Strain: Low Risk  (04/29/2022)   Overall Financial Resource Strain (CARDIA)    Difficulty of Paying Living Expenses: Not very hard  Food Insecurity: No Food Insecurity (04/29/2022)   Hunger Vital Sign    Worried About Running Out of Food in the Last Year: Never true    Ran Out of Food in the Last Year: Never true  Transportation Needs: No Transportation Needs (04/29/2022)   PRAPARE - Administrator, Civil Service (Medical): No    Lack of Transportation (Non-Medical): No  Physical Activity: Insufficiently Active (04/29/2022)   Exercise Vital Sign    Days of Exercise per Week: 3 days    Minutes of Exercise per Session: 30  min  Stress: No Stress Concern Present (04/29/2022)   [employer] of Occupational Health - Occupational Stress Questionnaire    Feeling of Stress : Only a little  Social Connections: Moderately Integrated (04/29/2022)   Social Connection and Isolation Panel [NHANES]    Frequency of Communication with Friends and Family: Three times a week    Frequency of Social Gatherings with  Friends and Family: Twice a week    Attends Religious Services: More than 4 times per year    Active Member of Clubs or Organizations: No    Attends Banker Meetings: Never    Marital Status: Married  Catering manager Violence: Not At Risk (04/29/2022)   Humiliation, Afraid, Rape, and Kick questionnaire    Fear of Current or Ex-Partner: No    Emotionally Abused: No    Physically Abused: No    Sexually Abused: No    Review of Systems: See HPI, otherwise negative ROS  Physical Exam: BP 132/88   Pulse (!) 124   Temp (!) 96.9 F (36.1 C) (Temporal)   Resp 18   Ht 5' 8.5" (1.74 m)   Wt 89.4 kg   SpO2 100%   BMI 29.55 kg/m  General:   Alert,  pleasant and cooperative in NAD Head:  Normocephalic and atraumatic. Neck:  Supple; no masses or thyromegaly. Lungs:  Clear throughout to auscultation, normal respiratory effort.    Heart:  +S1, +S2, Regular rate and rhythm, No edema. Abdomen:  Soft, nontender and nondistended. Normal bowel sounds, without guarding, and without rebound.   Neurologic:  Alert and  oriented x4;  grossly normal neurologically.  Impression/Plan: Pamela Romero is here for an endoscopy  to be performed for  evaluation of GERD and dysphagia.     Risks, benefits, limitations, and alternatives regarding endoscopy have been reviewed with the patient.  Questions have been answered.  All parties agreeable.   Wyline Mood, MD  11/07/2022, 8:36 AM

## 2022-11-07 NOTE — Anesthesia Postprocedure Evaluation (Signed)
Anesthesia Post Note  Patient: Pamela Romero  Procedure(s) Performed: ESOPHAGOGASTRODUODENOSCOPY (EGD) WITH PROPOFOL BIOPSY BALLOON DILATION POLYPECTOMY  Patient location during evaluation: PACU Anesthesia Type: General Level of consciousness: awake and awake and alert Pain management: satisfactory to patient Vital Signs Assessment: post-procedure vital signs reviewed and stable Respiratory status: spontaneous breathing and nonlabored ventilation Cardiovascular status: stable Anesthetic complications: no   No notable events documented.   Last Vitals:  Vitals:   11/07/22 0855 11/07/22 0911  BP: (!) 81/59 96/62  Pulse:    Resp:    Temp: (!) 36.1 C   SpO2:      Last Pain:  Vitals:   11/07/22 0905  TempSrc:   PainSc: 0-No pain                 VAN STAVEREN,Samaj Wessells

## 2022-11-08 ENCOUNTER — Encounter: Payer: Self-pay | Admitting: Gastroenterology

## 2022-11-09 ENCOUNTER — Other Ambulatory Visit: Payer: Self-pay | Admitting: Internal Medicine

## 2022-11-09 ENCOUNTER — Ambulatory Visit: Payer: Self-pay | Admitting: *Deleted

## 2022-11-09 ENCOUNTER — Encounter: Payer: Self-pay | Admitting: Gastroenterology

## 2022-11-09 NOTE — Telephone Encounter (Signed)
Message from [place] B sent at 11/09/2022  2:05 PM EDT  Summary: vomiting/constipation   Pt called in says vomited 3 times yesterday, and is also constipated          Call History  Contact Date/Time Type Contact Phone/Fax User  11/09/2022 02:05 PM EDT Phone (Incoming) [place], [place] (Self) [phone] Rexene Edison) [place], [place]   Reason for Disposition  [1] MILD or MODERATE vomiting AND [2] present > 48 hours (2 days) (Exception: Mild vomiting with associated diarrhea.)  Answer Assessment - Initial Assessment Questions 1. VOMITING SEVERITY: "How many times have you vomited in the past 24 hours?"     - MILD:  1 - 2 times/day    - MODERATE: 3 - 5 times/day, decreased oral intake without significant weight loss or symptoms of dehydration    - SEVERE: 6 or more times/day, vomits everything or nearly everything, with significant weight loss, symptoms of dehydration      I vomited 3 times yesterday and I'm constipated.   After I left the dr office yesterday after surgery.   I came home and ate and got some rest.   Yesterday I vomited 3 times.   I just took some Maalox.   I had an upper GI yesterday.    2. ONSET: "When did the vomiting begin?"      Yesterday 3 times after upper endoscopy.      They said it needed to be stretched.   I need to take the acid reflex medicine every day.   I don't know why I'm vomiting and I'm constipated.   I did talk with the other dr and he said he gives me a pill.   I don't know.   3. FLUIDS: "What fluids or food have you vomited up today?" "Have you been able to keep any fluids down?"     They did the upper endo because I have been vomiting for a long time.   I didn't go to the hospital.   I've been vomiting for weeks.   I've lost a lot of weight.   I told the GI dr I was vomiting before the endoscopy.     My main concern was Dr. Sampson Si ordered an ultrasound on my stomach but I was vomiting so much I could not have it done.    Does she still want me to have the  stomach ultrasound?   Every time I have an appt I am vomiting and can't go.    4. ABDOMEN PAIN: "Are your having any abdomen pain?" If Yes : "How bad is it and what does it feel like?" (e.g., crampy, dull, intermittent, constant)      I'm having discomfort when I vomit.    When I try to have a BM I'm straining and it causes my stomach to hurt. 5. DIARRHEA: "Is there any diarrhea?" If Yes, ask: "How many times today?"      No 6. CONTACTS: "Is there anyone else in the family with the same symptoms?"      N/A 7. CAUSE: "What do you think is causing your vomiting?"     I don't know 8. HYDRATION STATUS: "Any signs of dehydration?" (e.g., dry mouth [not only dry lips], too weak to stand) "When did you last urinate?"     I'm drinking drinking fluids and it's staying down.    I have not vomited today. 9. OTHER SYMPTOMS: "Do you have any other symptoms?" (e.g., fever, headache, vertigo, vomiting blood  or coffee grounds, recent head injury)     constipation 10. PREGNANCY: "Is there any chance you are pregnant?" "When was your last menstrual period?"       N/A due to age  Protocols used: Vomiting-A-AH

## 2022-11-09 NOTE — Telephone Encounter (Signed)
  Chief Complaint: vomited 3 times yesterday but not today.   Constipated for 2 weeks.   She disimpacts herself. Symptoms: Not vomiting today but constipated.   She goes to the pain clinic and is on opioid medications which cause her to be constipated.   "I actually have to dig the stool out myself because I'm so backed up and can't go".   "This has been a chronic problem for a very long time". Frequency: Last BM on her own was 2 weeks ago. Pertinent Negatives: Patient denies having abd pain but she does have discomfort when she strains having a BM. Disposition: [] ED /[] Urgent Care (no appt availability in office) / [x] Appointment(In office/virtual)/ []  [facility]/ [] Home Care/ [] Refused Recommended Disposition /[] [facility]/ []  Follow-up with PCP Additional Notes: She is taking Miralax for the constipation.   I asked her if the pain medicine dr had ever given her anything for the constipation like Linzess or anything she said no other than using Miralax.   She is going to call her pain medicine dr and see if they will call in an rx for the Linzess since she is so constipated all the time.  She is also wanting to know if Rene Kocher still wants her to have the ultrasound of her abd.   She wasn't able to go get it done because she was vomiting.   If so Rene Kocher needs to call in another order.  I made her an appt with Nicki Reaper, NP for 11/22/2022 at 2:20 with instructions to go to the ED if she is still unable to have a BM or get herself disimpacted like she's been doing.  She was agreeable to this plan.

## 2022-11-09 NOTE — Telephone Encounter (Signed)
I tried calling the number provided and I got a recording that the call can not be completed.  [facility] please offer pt an appointment with Erin Mecum PA-C on 11/11/2022 if she calls back.    Thanks,   -Vernona Rieger

## 2022-11-10 NOTE — Telephone Encounter (Signed)
Refilled 08/16/22 # 90 with 1 refill. Requested Prescriptions  Refused Prescriptions Disp Refills   atorvastatin (LIPITOR) 10 MG tablet [Pharmacy Med Name: ATORVASTATIN CALCIUM 10 MG TAB] 90 tablet 1    Sig: TAKE 1 TABLET BY MOUTH ONCE DAILY     Cardiovascular:  Antilipid - Statins Failed - 11/09/2022 12:42 PM      Failed - Lipid Panel in normal range within the last 12 months    Cholesterol  Date Value Ref Range Status  10/28/2022 125 <200 mg/dL Final   LDL Cholesterol (Calc)  Date Value Ref Range Status  10/28/2022 61 mg/dL (calc) Final    Comment:    Reference range: <100 . Desirable range <100 mg/dL for primary prevention;   <70 mg/dL for patients with CHD or diabetic patients  with > or = 2 CHD risk factors. [name] LDL-C is now calculated using the Martin-Hopkins  calculation, which is a validated novel method providing  better accuracy than the Friedewald equation in the  estimation of LDL-C.  Horald Pollen et al. Lenox Ahr. 1324;401(02): 2061-2068  (http://education.[employer].com/faq/FAQ164)    Direct LDL  Date Value Ref Range Status  01/21/2015 144.0 mg/dL Final    Comment:    Optimal:  <100 mg/dLNear or Above Optimal:  100-129 mg/dLBorderline High:  130-159 mg/dLHigh:  160-189 mg/dLVery High:  >190 mg/dL   HDL  Date Value Ref Range Status  10/28/2022 43 (L) > OR = 50 mg/dL Final   Triglycerides  Date Value Ref Range Status  10/28/2022 124 <150 mg/dL Final         Passed - Patient is not pregnant      Passed - Valid encounter within last 12 months    Recent Outpatient Visits           2 weeks ago Encounter for general adult medical examination with abnormal findings   [facility] [place], Salvadore Oxford, NP   2 weeks ago Essential hypertension   [facility] [place], Jackelyn Poling, RPH-CPP   2 months ago Essential hypertension   [facility] [place], Gentry Fitz A, RPH-CPP   7 months ago  Chronic obstructive pulmonary disease with acute exacerbation [facility])   [place] [facility] [place], Salvadore Oxford, NP   7 months ago Need for immunization against influenza   [facility] [place], Salvadore Oxford, NP       Future Appointments             In 1 week Sampson Si, Salvadore Oxford, NP [facility], [facility]   In 5 months [place], Salvadore Oxford, NP [facility], [facility]

## 2022-11-22 ENCOUNTER — Encounter: Payer: Self-pay | Admitting: Internal Medicine

## 2022-11-22 ENCOUNTER — Ambulatory Visit (INDEPENDENT_AMBULATORY_CARE_PROVIDER_SITE_OTHER): Payer: Medicare HMO | Admitting: Internal Medicine

## 2022-11-22 VITALS — BP 112/64 | HR 118 | Temp 95.7°F | Wt 193.0 lb

## 2022-11-22 DIAGNOSIS — K222 Esophageal obstruction: Secondary | ICD-10-CM | POA: Insufficient documentation

## 2022-11-22 DIAGNOSIS — I5032 Chronic diastolic (congestive) heart failure: Secondary | ICD-10-CM

## 2022-11-22 DIAGNOSIS — T402X5A Adverse effect of other opioids, initial encounter: Secondary | ICD-10-CM | POA: Insufficient documentation

## 2022-11-22 DIAGNOSIS — R600 Localized edema: Secondary | ICD-10-CM

## 2022-11-22 DIAGNOSIS — K219 Gastro-esophageal reflux disease without esophagitis: Secondary | ICD-10-CM

## 2022-11-22 DIAGNOSIS — K5903 Drug induced constipation: Secondary | ICD-10-CM

## 2022-11-22 MED ORDER — FUROSEMIDE 20 MG PO TABS: 20.0000 mg | ORAL_TABLET | Freq: Every day | ORAL | 0 refills | Status: DC

## 2022-11-22 MED ORDER — NALOXEGOL OXALATE 12.5 MG PO TABS: 12.5000 mg | ORAL_TABLET | Freq: Every day | ORAL | 0 refills | Status: DC

## 2022-11-22 MED ORDER — POTASSIUM CHLORIDE CRYS ER 20 MEQ PO TBCR: 20.0000 meq | EXTENDED_RELEASE_TABLET | Freq: Every day | ORAL | 0 refills | Status: DC

## 2022-11-22 NOTE — Patient Instructions (Signed)
Heart Failure, Diagnosis  Heart failure means that your heart is not able to pump blood in the right way. This makes it hard for your body to work well. Heart failure is usually a long-term (chronic) condition. You must take good care of yourself and follow your treatment plan from your doctor. Different stages of heart failure have different treatment plans. The stages are: Stage A: At risk for heart failure. Stage B: Pre-heart failure. Stage C: Symptomatic heart failure. Stage D: Advanced heart failure. What are the causes? High blood pressure. Buildup of cholesterol and fat in the arteries. Heart attack. This injures the heart muscle. Heart valves that do not open and close properly. Damage of the heart muscle. This is also called cardiomyopathy. Infection of the heart muscle. This is also called myocarditis. Lung disease. What increases the risk? Getting older. The risk of heart failure goes up as a person ages. Being overweight. Using tobacco or nicotine products. Abusing alcohol or drugs. Having taken medicines that can damage the heart. Having any of these conditions: Diabetes. Abnormal heart rhythms. Thyroid problems. Low blood counts (anemia). Having a family history of heart failure. What are the signs or symptoms? Shortness of breath. Coughing. Swelling of the feet, ankles, legs, or belly. Losing or gaining weight for no reason. Trouble breathing. Waking from sleep because of the need to sit up and get more air. Fast heartbeat. Other symptoms may include: Being very tired. Feeling dizzy, or feeling like you may pass out (faint). Having no desire to eat. Feeling like you may vomit (nauseous). Peeing (urinating) more at night. Feeling confused. How is this treated? This condition may be treated with: Medicines. These can be given to treat blood pressure and to make the heart muscles stronger. Changes in your daily life. These may include: Eating a healthy  diet. Staying at a healthy body weight. Quitting tobacco, alcohol, and drug use. Doing exercises. Participating in a cardiac rehabilitation program. This program helps you improve your health through exercise, education, and counseling. Surgery. Surgery can be done to open blocked valves or to put devices in the heart, such as pacemakers. A donor heart (heart transplant). You will receive a healthy heart from a donor. Follow these instructions at home: Treat other conditions as told by your doctor. These may include high blood pressure, diabetes, thyroid disease, or abnormal heart rhythms. Learn as much as you can about heart failure. Get support as you need it. Keep all follow-up visits. Where to find more information American Heart Association: www.heart.org Centers for Disease Control and Prevention: www.cdc.gov National Institute on Aging: www.nia.nih.gov Summary Heart failure means that your heart is not able to pump blood in the right way. This condition is often caused by high blood pressure, heart attack, or damage of the heart muscle. Symptoms of this condition include shortness of breath and swelling of the feet, ankles, legs, or belly. You may also feel very tired or feel like you may vomit. You may be treated with medicines, surgery, or changes in your daily life. Treat other health conditions as told by your doctor. This information is not intended to replace advice given to you by your health care provider. Make sure you discuss any questions you have with your health care provider. Document Revised: 09/24/2020 Document Reviewed: 10/19/2019 Elsevier Patient Education  2024 Elsevier Inc.  

## 2022-11-22 NOTE — Progress Notes (Signed)
Subjective:    Patient ID: Pamela Romero, female    DOB: [birth year], 72 y.o.   MRN: [id number]  HPI  Patient presents to clinic today for follow up nausea, vomiting and constipation.  She underwent an EGD with balloon dilatation by Dr. Tobi Bastos on 7/29.  She has a history of GERD managed on famotidine and esomeprazole.  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  She was advised to use colace but she is not taking this. She reports she also has to disimpact herself. She is on hydrocodone. She follows with GI and pain management.  She also reports swelling in her lower extremities, left greater than right.  She reports this started about 1 week ago.  She reports her lower extremities are tender to touch.  She has noticed some redness and warmth as well.  She has not taken anything OTC for this.  She had an ultrasound venous reflux of her left lower extremity 05/2022 which showed:  Left:  - No evidence of deep vein thrombosis seen in the left lower extremity,  from the common femoral through the popliteal veins.  - No evidence of superficial venous thrombosis in the left lower  extremity.  - Venous reflux is noted in the left short saphenous vein.  - Avascular cystic mass noted in the popliteal fossa, measuring 4.6 cm in  length x 1.4 cm width. Ultrasound characteristics consistent with Baker's cyst.  Review of Systems     Past Medical History:  Diagnosis Date   Abnormal EKG    CAD (coronary artery disease)    non critical, by cardiac cath 2006   Cancer [facility])    endometrial Ca   COPD (chronic obstructive pulmonary disease) (HCC)    Endometrial adenocarcinoma (HCC) 2011   Hyperlipidemia    Hypertension    Osteoarthritis    Right foot, Left knee (Dr. Joice Lofts)   Reflux gastritis    Thyroid disease    tobacco abuse    Vaginal delivery    x 2    Current Outpatient Medications  Medication Sig Dispense Refill   albuterol (PROAIR  HFA) 108 (90 Base) MCG/ACT inhaler Inhale 1-2 puffs into the lungs every 6 (six) hours as needed for wheezing or shortness of breath. 6.7 g 1   aspirin EC 81 MG tablet Take 1 tablet (81 mg total) by mouth daily. Swallow whole. 30 tablet 12   atorvastatin (LIPITOR) 10 MG tablet Take 1 tablet (10 mg total) by mouth daily. 90 tablet 1   docusate sodium (COLACE) 100 MG capsule Take 1 capsule (100 mg total) by mouth at bedtime as needed for mild constipation. 90 capsule 0   esomeprazole (NEXIUM) 40 MG capsule Take 1 capsule (40 mg total) by mouth 2 (two) times daily before a meal. 180 capsule 0   famotidine (PEPCID) 20 MG tablet Take 1 tablet (20 mg total) by mouth at bedtime. 90 tablet 1   gabapentin (NEURONTIN) 300 MG capsule Take 1-2 capsules (300-600 mg total) by mouth at bedtime. (Patient taking differently: Take 300-600 mg by mouth at bedtime as needed.) 180 capsule 2   HYDROcodone-acetaminophen (NORCO) 7.5-325 MG tablet Take 1 tablet by mouth every 6 (six) hours as needed for severe pain. Must last 30 days. 105 tablet 0   [START ON 12/20/2022] HYDROcodone-acetaminophen (NORCO) 7.5-325 MG tablet Take 1 tablet by mouth every 6 (six) hours as needed for severe pain. Must last 30 days. 105 tablet 0  levothyroxine (SYNTHROID) 112 MCG tablet Take 1 tablet (112 mcg total) by mouth daily. 90 tablet 3   lisinopril-hydrochlorothiazide (ZESTORETIC) 20-12.5 MG tablet TAKE 1 TABLET BY MOUTH ONCE DAILY 90 tablet 1   ondansetron (ZOFRAN-ODT) 4 MG disintegrating tablet Take 1 tablet (4 mg total) by mouth daily as needed for nausea or vomiting. 30 tablet 0   polyethylene glycol powder (GLYCOLAX/MIRALAX) 17 GM/SCOOP powder Take 17 g by mouth daily. 3350 g 1   rizatriptan (MAXALT-MLT) 10 MG disintegrating tablet Take 1 tablet (10 mg total) by mouth as needed for migraine. May repeat in 2 hours if needed 10 tablet 2   sertraline (ZOLOFT) 50 MG tablet Take 1/2 tab PO x 10 days then increase to 1 tab daily thereafter 90  tablet 1   Current Facility-Administered Medications  Medication Dose Route Frequency Provider Last Rate Last Admin   naloxegol oxalate (MOVANTIK) tablet 12.5 mg  12.5 mg Oral Daily Celso Amy, PA-C        No Known Allergies  Family History  Problem Relation Age of Onset   Alcohol abuse Father    Cancer Maternal Aunt        stomach   Alcohol abuse Maternal Aunt    Alcohol abuse Mother    Heart disease Brother        MI age 59- non smoker   Cancer Brother        Pancreatic   Cancer Brother        colon, dx'd age 48   Cancer Brother        stomach, paternal half brother   Breast cancer Neg Hx     Social History   Socioeconomic History   Marital status: Married    Spouse name: Not on file   Number of children: 0   Years of education: Not on file   Highest education level: Not on file  Occupational History   Occupation: retired   Tobacco Use   Smoking status: Former    Current packs/day: 0.00    Average packs/day: 0.5 packs/day for 50.0 years (25.0 ttl pk-yrs)    Types: Cigarettes    Start date: 05/11/1968    Quit date: 05/11/2018    Years since quitting: 4.5   Smokeless tobacco: Former  Building services engineer status: Never Used  Substance and Sexual Activity   Alcohol use: No   Drug use: No   Sexual activity: Not on file  Other Topics Concern   Not on file  Social History Narrative   Lives with spouse, takes care of her mother.   Had 2 children, one died at birth, the other given up for adoption (was unmarried).   Social Determinants of Health   Financial Resource Strain: Low Risk  (04/29/2022)   Overall Financial Resource Strain (CARDIA)    Difficulty of Paying Living Expenses: Not very hard  Food Insecurity: No Food Insecurity (04/29/2022)   Hunger Vital Sign    Worried About Running Out of Food in the Last Year: Never true    Ran Out of Food in the Last Year: Never true  Transportation Needs: No Transportation Needs (04/29/2022)   PRAPARE -  Administrator, Civil Service (Medical): No    Lack of Transportation (Non-Medical): No  Physical Activity: Insufficiently Active (04/29/2022)   Exercise Vital Sign    Days of Exercise per Week: 3 days    Minutes of Exercise per Session: 30 min  Stress: No Stress Concern Present (04/29/2022)  [employer] of Occupational Health - Occupational Stress Questionnaire    Feeling of Stress : Only a little  Social Connections: Moderately Integrated (04/29/2022)   Social Connection and Isolation Panel [NHANES]    Frequency of Communication with Friends and Family: Three times a week    Frequency of Social Gatherings with Friends and Family: Twice a week    Attends Religious Services: More than 4 times per year    Active Member of [employer] or Organizations: No    Attends Banker Meetings: Never    Marital Status: Married  Catering manager Violence: Not At Risk (04/29/2022)   Humiliation, Afraid, Rape, and Kick questionnaire    Fear of Current or Ex-Partner: No    Emotionally Abused: No    Physically Abused: No    Sexually Abused: No     Constitutional: Patient reports intermittent headaches.  Denies fever, malaise, fatigue, or abrupt weight changes.  HEENT: Denies eye pain, eye redness, ear pain, ringing in the ears, wax buildup, runny nose, nasal congestion, bloody nose, or sore throat. Respiratory: Patient reports chronic shortness of breath.  Denies difficulty breathing, cough or sputum production.   Cardiovascular: Patient reports swelling in legs.  Denies chest pain, chest tightness, palpitations or swelling in the hands.  Gastrointestinal: Patient reports intermittent nausea, vomiting and chronic constipation.  Denies abdominal pain, bloating, diarrhea or blood in the stool.  GU: Denies urgency, frequency, pain with urination, burning sensation, blood in urine, odor or discharge. Musculoskeletal: Patient reports chronic joint pain.  Denies decrease in range of  motion, difficulty with gait, muscle pain or joint swelling.  Skin: Patient reports redness and warmth of bilateral lower extremities.  Denies rashes, lesions or ulcercations.  Neurological: Patient reports insomnia.  Denies dizziness, difficulty with memory, difficulty with speech or problems with balance and coordination.  Psych: Patient has a history of anxiety and depression.  Denies SI/HI.  No other specific complaints in a complete review of systems (except as listed in HPI above).  Objective:   Physical Exam  BP 112/64 (BP Location: Left Arm, Patient Position: Sitting, Cuff Size: Normal)   Pulse (!) 118   Temp (!) 95.7 F (35.4 C) (Temporal)   Wt 193 lb (87.5 kg)   SpO2 99%   BMI 28.92 kg/m   Wt Readings from Last 3 Encounters:  11/07/22 197 lb 3.2 oz (89.4 kg)  10/26/22 188 lb (85.3 kg)  10/11/22 196 lb (88.9 kg)    General: Appears her stated age, overweight, in NAD. Skin: Warm, dry and intact. Senile purpura noted of BLE. Cardiovascular: Tachycardic with normal rhythm. S1,S2 noted.  No murmur, rubs or gallops noted. No JVD. 1+ pitting BLE, L>R. Pulmonary/Chest: Normal effort and positive vesicular breath sounds. No respiratory distress. No wheezes, rales or ronchi noted.  Abdomen: Soft and nontender. Hypoactive bowel sounds. No distention or masses noted.  Musculoskeletal: No difficulty with gait.  Neurological: Alert and oriented.  Coordination normal.      BMET    Component Value Date/Time   NA 141 10/28/2022 1137   NA 142 09/01/2022 1017   K 3.1 (L) 10/28/2022 1137   CL 102 10/28/2022 1137   CO2 29 10/28/2022 1137   GLUCOSE 94 10/28/2022 1137   BUN 17 10/28/2022 1137   BUN 22 09/01/2022 1017   CREATININE 1.01 (H) 10/28/2022 1137   CALCIUM 9.6 10/28/2022 1137   GFRNONAA >60 04/07/2020 1022   GFRNONAA 52 (L) 01/02/2020 1453   GFRAA 61 01/02/2020 1453  Lipid Panel     Component Value Date/Time   CHOL 125 10/28/2022 1137   TRIG 124 10/28/2022 1137    HDL 43 (L) 10/28/2022 1137   CHOLHDL 2.9 10/28/2022 1137   VLDL 42.0 (H) 05/14/2014 0918   LDLCALC 61 10/28/2022 1137    CBC    Component Value Date/Time   WBC 6.6 10/28/2022 1137   RBC 3.47 (L) 10/28/2022 1137   HGB 10.6 (L) 10/28/2022 1137   HGB 10.1 (L) 09/01/2022 1017   HCT 33.5 (L) 10/28/2022 1137   HCT 31.4 (L) 09/01/2022 1017   PLT 180 10/28/2022 1137   MCV 96.5 10/28/2022 1137   MCV 94 09/01/2022 1017   MCH 30.5 10/28/2022 1137   MCHC 31.6 (L) 10/28/2022 1137   RDW 13.1 10/28/2022 1137   RDW 13.3 09/01/2022 1017   LYMPHSABS 2,125 10/28/2022 1137   LYMPHSABS 1.7 09/01/2022 1017   MONOABS 0.4 09/16/2013 1046   EOSABS 59 10/28/2022 1137   EOSABS 0.1 09/01/2022 1017   BASOSABS 40 10/28/2022 1137   BASOSABS 0.1 09/01/2022 1017    Hgb A1C Lab Results  Component Value Date   HGBA1C 4.9 10/05/2021            Assessment & Plan:   Esophageal stenosis, GERD, chronic constipation:  Encouraged high-fiber diet and adequate water intake Nausea and vomiting symptoms have significantly improved since dilatation Continue famotidine and esomeprazole as previously prescribed Will start Movantik 12.5 mg daily for opioid-induced constipation She will consider having a colonoscopy with GI  Peripheral edema, diastolic dysfunction:  No evidence of significant heart failure on recent echo from 06/2022 Rx for furosemide 20 mg daily x 5 days Rx for potassium chloride 20 mEq daily for 5 days Encouraged low-salt diet and elevation Will plan to repeat Bmet in 5 days  RTC in 5 months, follow-up chronic conditions Nicki Reaper, NP

## 2022-11-28 ENCOUNTER — Other Ambulatory Visit: Payer: Medicare HMO

## 2022-11-28 DIAGNOSIS — R600 Localized edema: Secondary | ICD-10-CM

## 2022-11-29 ENCOUNTER — Ambulatory Visit: Admission: RE | Admit: 2022-11-29 | Payer: Medicare HMO | Source: Ambulatory Visit

## 2022-11-29 ENCOUNTER — Ambulatory Visit: Payer: Medicare HMO

## 2022-11-29 LAB — BASIC METABOLIC PANEL WITH GFR
BUN/Creatinine Ratio: 19 (calc) (ref 6–22)
BUN: 23 mg/dL (ref 7–25)
CO2: 30 mmol/L (ref 20–32)
Calcium: 9.3 mg/dL (ref 8.6–10.4)
Chloride: 103 mmol/L (ref 98–110)
Creat: 1.24 mg/dL — ABNORMAL HIGH (ref 0.60–1.00)
Glucose, Bld: 76 mg/dL (ref 65–139)
Potassium: 4 mmol/L (ref 3.5–5.3)
Sodium: 140 mmol/L (ref 135–146)
eGFR: 47 mL/min/{1.73_m2} — ABNORMAL LOW (ref >=60)

## 2022-12-01 IMAGING — MR MR HEAD WO/W CM
15 series · 48 of 48 positions shown · IV contrast (gadavist)
Comparison: Head MRI 09/25/2019

CLINICAL DATA: TIA. Recurrent episodes of transient visual loss.
Frequent headaches.

EXAM:
MRI HEAD WITHOUT AND WITH CONTRAST
TECHNIQUE: Multiplanar, multiecho pulse sequences of the brain and surrounding
structures were obtained without and with intravenous contrast.
CONTRAST:  10mL GADAVIST GADOBUTROL 1 MMOL/ML IV SOLN

[Series 5: ax dwi_tracew · axial · 3.0mm · 0.65mm/px · z∈[-82,+73]mm · 3 of 48 slices shown]
[im 1/48]
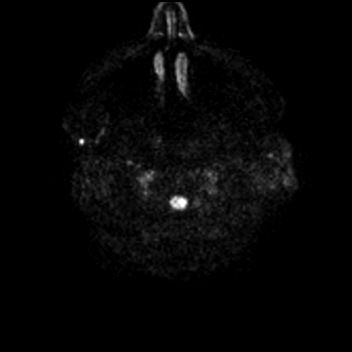
[im 24/48]
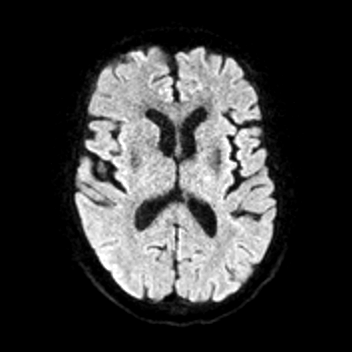
[im 48/48]
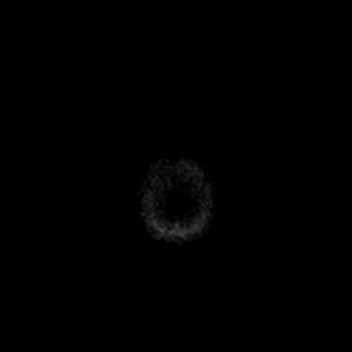

[Series 6: ax dwi_adc · axial · 3.0mm · 0.65mm/px · z∈[-82,+73]mm · 3 of 48 slices shown]
[im 1/48]
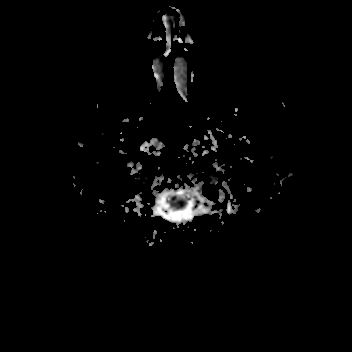
[im 24/48]
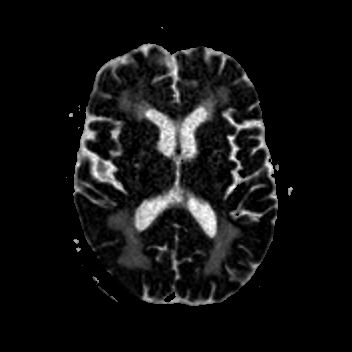
[im 48/48]
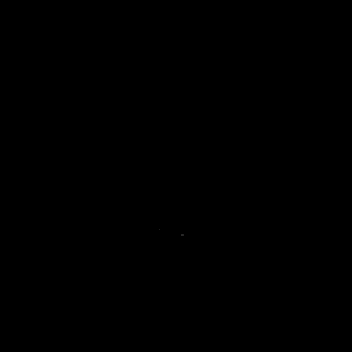

[Series 7: cor dwi_tracew · coronal · 5.0mm · 0.60mm/px · 2 of 38 slices shown]
[im 1/38]
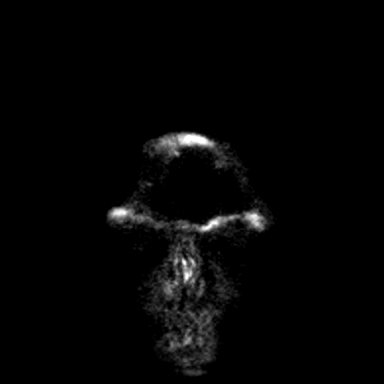
[im 38/38]
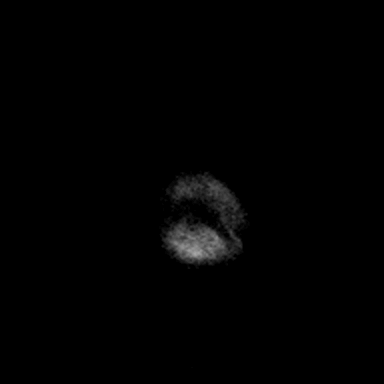

[Series 8: cor dwi_adc · coronal · 5.0mm · 0.60mm/px · 2 of 35 slices shown]
[im 1/35]
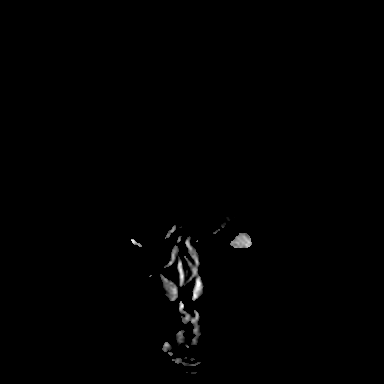
[im 35/35]
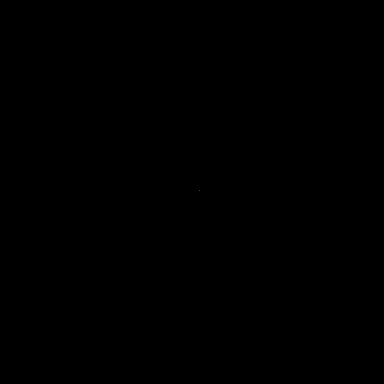

[Series 9: T1 · sagittal · 5.0mm · 0.62mm/px · 1 of 23 slices shown (1 of 2)]
[im 1/23]
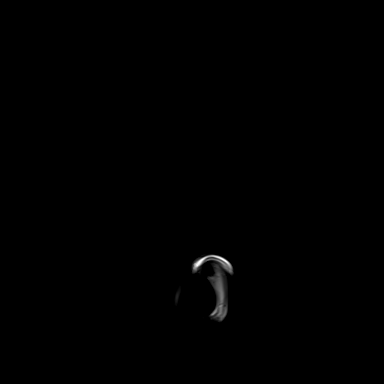

[Series 10: T2 · axial · 5.0mm · 0.53mm/px · z∈[-83,+73]mm · 2 of 27 slices shown]
[im 1/27]
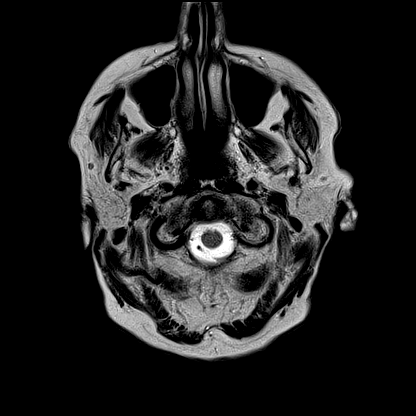
[im 27/27]
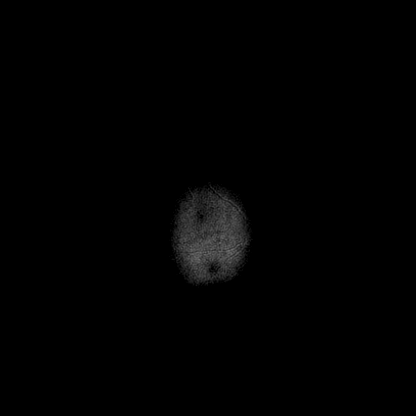

[Series 12: ax swi_pha · axial · 2.0mm · 0.90mm/px · z∈[-83,+74]mm · 4 of 80 slices shown]
[im 1/80]
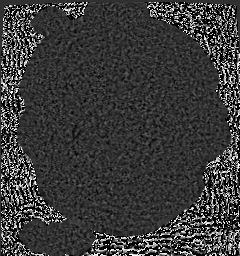
[im 27/80]
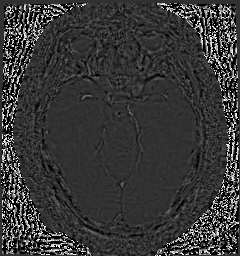
[im 53/80]
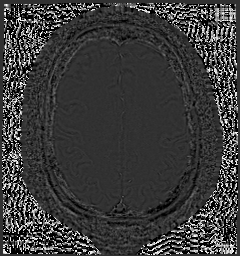
[im 80/80]
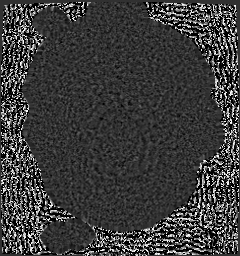

[Series 13: ax swi_swi · axial · 2.0mm · 0.90mm/px · z∈[-83,+74]mm · 4 of 80 slices shown]
[im 1/80]
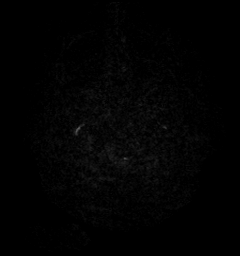
[im 27/80]
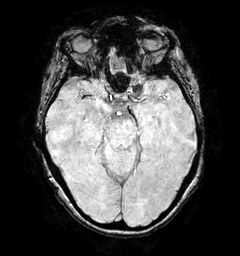
[im 53/80]
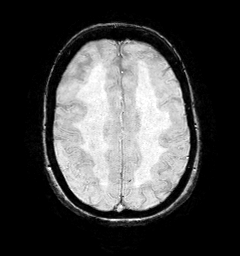
[im 80/80]
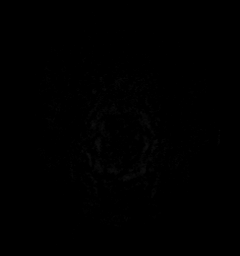

[Series 15: FLAIR · axial · 3.0mm · 0.53mm/px · z∈[-86,+76]mm · 3 of 55 slices shown (1 of 2)]
[im 1/55]
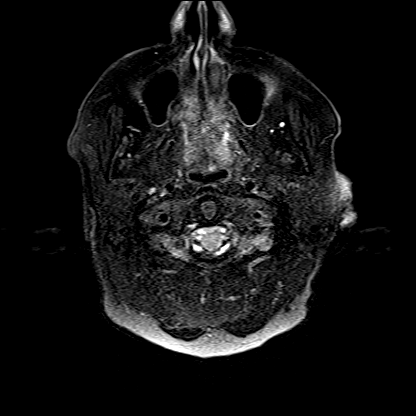
[im 28/55]
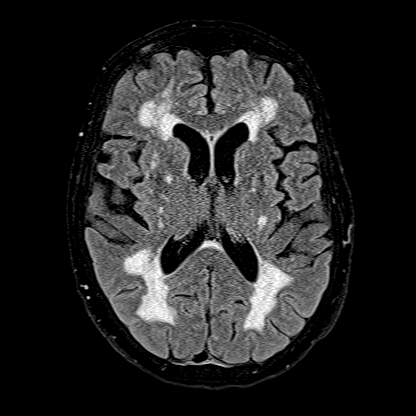
[im 55/55]
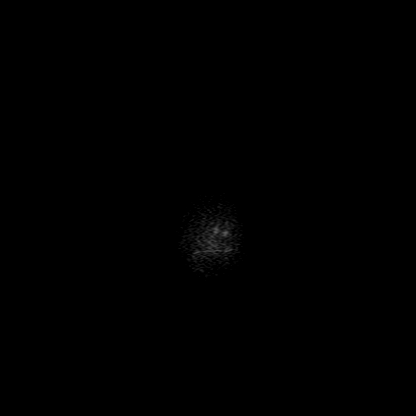

[Series 16: T1 · axial · 1.0mm · 0.98mm/px · z∈[-83,+75]mm · 9 of 160 slices shown (2 of 2)]
[im 1/160]
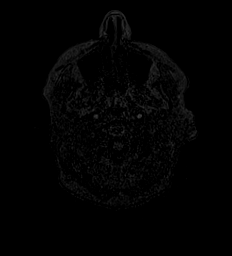
[im 20/160]
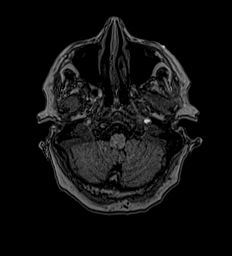
[im 40/160]
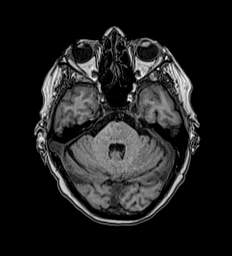
[im 60/160]
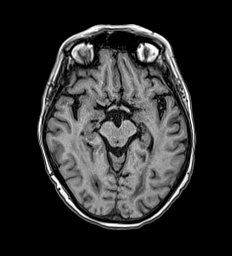
[im 80/160]
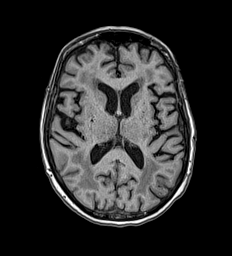
[im 100/160]
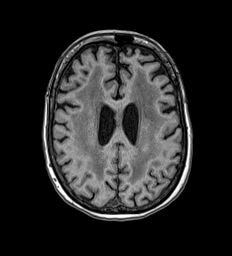
[im 120/160]
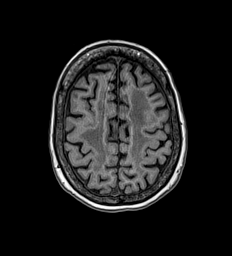
[im 140/160]
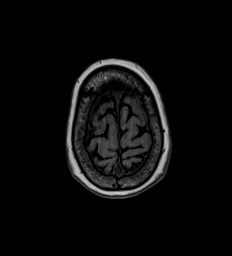
[im 160/160]
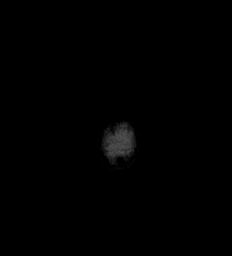

[Series 17: FLAIR · sagittal · 5.0mm · 0.94mm/px · 1 of 23 slices shown (2 of 2)]
[im 1/23]
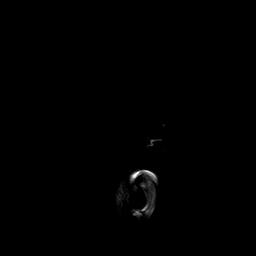

[Series 18: T2 post-contrast · coronal · 5.0mm · 0.57mm/px · 2 of 29 slices shown]
[im 1/29]
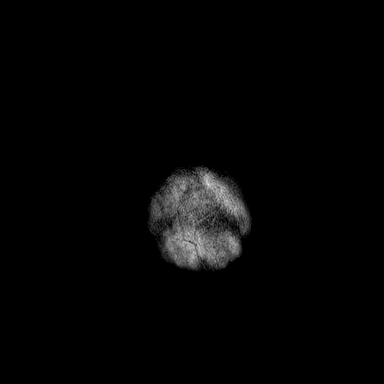
[im 29/29]
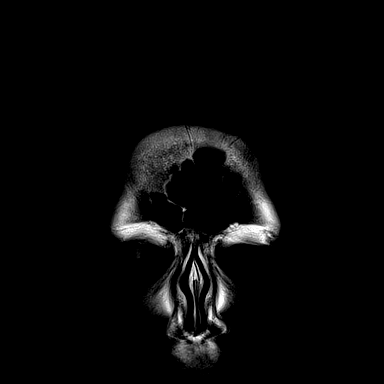

[Series 19: T1 post-contrast · axial · 1.0mm · 0.98mm/px · z∈[-83,+75]mm · 9 of 160 slices shown (1 of 3)]
[im 1/160]
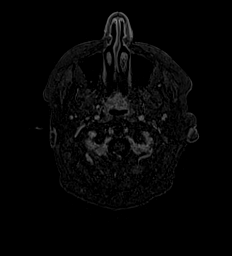
[im 20/160]
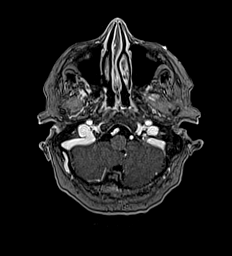
[im 40/160]
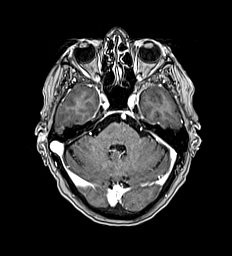
[im 60/160]
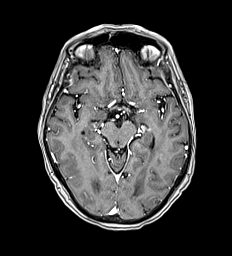
[im 80/160]
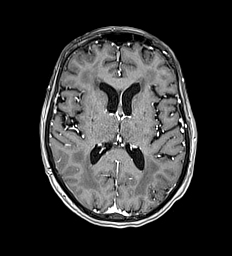
[im 100/160]
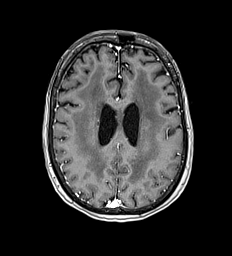
[im 120/160]
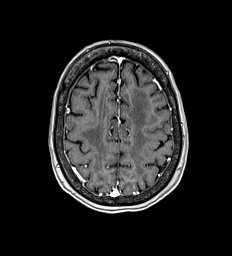
[im 140/160]
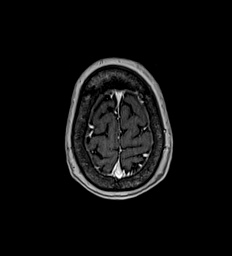
[im 160/160]
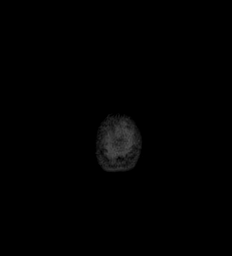

[Series 20: T1 post-contrast · coronal · 5.0mm · 0.57mm/px · 2 of 29 slices shown (2 of 3)]
[im 1/29]
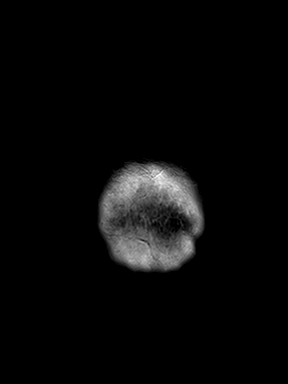
[im 29/29]
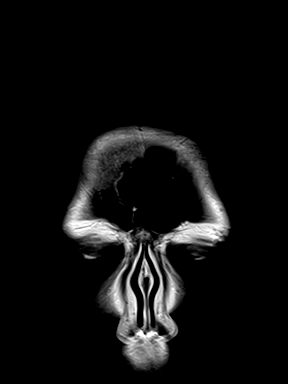

[Series 21: T1 post-contrast · sagittal · 5.0mm · 0.62mm/px · 1 of 23 slices shown (3 of 3)]
[im 1/23]
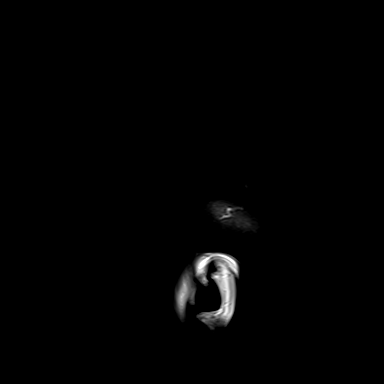

[48 of 48 positions shown; findings below may reference images not displayed]

FINDINGS: Brain: There is no evidence of an acute infarct, intracranial
hemorrhage, mass, midline shift, or extra-axial fluid collection.
The ventricles and sulci are within normal limits for age. Confluent
T2 hyperintensities throughout the cerebral white matter bilaterally
are unchanged and nonspecific but compatible with severe chronic
small vessel ischemic disease. Chronic lacunar infarcts are noted in
the corona radiata and basal ganglia bilaterally. No abnormal
enhancement is identified.

Vascular: Major intracranial vascular flow voids are preserved.

Skull and upper cervical spine: Unremarkable bone marrow signal.

Sinuses/Orbits: Unremarkable orbits. Paranasal sinuses and mastoid
air cells are clear.

Other: None.
IMPRESSION: 1. No acute intracranial abnormality.
2. Severe chronic small vessel ischemic disease.

## 2023-01-12 ENCOUNTER — Ambulatory Visit
Payer: Medicare HMO | Attending: Student in an Organized Health Care Education/Training Program | Admitting: Student in an Organized Health Care Education/Training Program

## 2023-01-12 ENCOUNTER — Encounter: Payer: Self-pay | Admitting: Student in an Organized Health Care Education/Training Program

## 2023-01-12 VITALS — BP 118/82 | HR 93 | Temp 97.3°F | Resp 16 | Ht 68.0 in | Wt 190.0 lb

## 2023-01-12 DIAGNOSIS — M5416 Radiculopathy, lumbar region: Secondary | ICD-10-CM | POA: Diagnosis not present

## 2023-01-12 DIAGNOSIS — M25571 Pain in right ankle and joints of right foot: Secondary | ICD-10-CM | POA: Diagnosis not present

## 2023-01-12 DIAGNOSIS — M170 Bilateral primary osteoarthritis of knee: Secondary | ICD-10-CM | POA: Insufficient documentation

## 2023-01-12 DIAGNOSIS — M1712 Unilateral primary osteoarthritis, left knee: Secondary | ICD-10-CM | POA: Diagnosis not present

## 2023-01-12 DIAGNOSIS — G8929 Other chronic pain: Secondary | ICD-10-CM | POA: Diagnosis not present

## 2023-01-12 DIAGNOSIS — G894 Chronic pain syndrome: Secondary | ICD-10-CM | POA: Insufficient documentation

## 2023-01-12 DIAGNOSIS — M722 Plantar fascial fibromatosis: Secondary | ICD-10-CM | POA: Diagnosis not present

## 2023-01-12 MED ORDER — HYDROCODONE-ACETAMINOPHEN 7.5-325 MG PO TABS: 1.0000 | ORAL_TABLET | Freq: Four times a day (QID) | ORAL | 0 refills | Status: AC | PRN

## 2023-01-12 MED ORDER — GABAPENTIN 300 MG PO CAPS: 300.0000 mg | ORAL_CAPSULE | Freq: Every day | ORAL | 2 refills | Status: DC

## 2023-01-12 MED ORDER — HYDROCODONE-ACETAMINOPHEN 7.5-325 MG PO TABS: 1.0000 | ORAL_TABLET | Freq: Four times a day (QID) | ORAL | 0 refills | Status: DC | PRN

## 2023-01-12 NOTE — Progress Notes (Signed)
PROVIDER NOTE: Information contained herein reflects review and annotations entered in association with encounter. Interpretation of such information and data should be left to medically-trained personnel. Information provided to patient can be located elsewhere in the medical record under "Patient Instructions". Document created using STT-dictation technology, any transcriptional errors that may result from process are unintentional.    Patient: Pamela Romero  Service Category: E/M  Provider: Edward Jolly, MD  DOB: 01/09/1951  DOS: 01/12/2023  Specialty: Interventional Pain Management  MRN: [id number]  Setting: Ambulatory outpatient  PCP: Lorre Munroe, NP  Type: Established Patient    Referring Provider: Lorre Munroe, NP  Location: Office  Delivery: Face-to-face     HPI  [name], a 73 y.o. year old female, is here today because of her Bilateral primary osteoarthritis of knee [M17.0]. [name] primary complain today is Knee Pain (left)  Last encounter: My last encounter with her was on 10/11/22 Pertinent problems: [name] has Bilateral primary osteoarthritis of knee and Osteoporosis on their pertinent problem list. Pain Assessment: Severity of Chronic pain is reported as a 7 /10. Location: Knee Left/left lower leg. Onset: More than a month ago. Quality: Stabbing. Timing: Constant. Modifying factor(s): Hydrocodone. Vitals:  height is 5\' 8"  (1.727 m) and weight is 190 lb (86.2 kg). Her temporal temperature is 97.3 F (36.3 C) (abnormal). Her blood pressure is 118/82 and her pulse is 93. Her respiration is 16 and oxygen saturation is 98%.   Reason for encounter:  MM and worsening  bilateral knee pain  Refill of hydrocodone as below.  No change in dose.  Patient states that she is utilizing every 6-8 hours as needed right now as her knee pain does flareup in the winter months.  Patient requesting repeat long-acting Kenalog injection for both knees.  She had long-acting  Kenalog previously done in 08/29/22  that provided her to her with 75%  pain relief for about 4 months and now she is having increased pain especially with weightbearing for the left knee.    Pharmacotherapy Assessment  Analgesic: Hydrocodone 7.5 mg 3 times daily as needed with an extra quantity 15/month (#105)For severe breakthrough pain    Monitoring: [place] PMP: PDMP reviewed during this encounter.       Pharmacotherapy: No side-effects or adverse reactions reported. Compliance: No problems identified. Effectiveness: Clinically acceptable.  UDS:  Summary  Date Value Ref Range Status  10/12/2022 Note  Final    Comment:    ==================================================================== [employer] [facility] 13 (MW) ==================================================================== Test                             Result       Flag       Units  Drug Present and Declared for Prescription Verification   Hydrocodone                    4019         EXPECTED   ng/mg creat   Hydromorphone                  840          EXPECTED   ng/mg creat   Dihydrocodeine                 793          EXPECTED   ng/mg creat   Norhydrocodone  3106         EXPECTED   ng/mg creat    Sources of hydrocodone include scheduled prescription medications.    Hydromorphone, dihydrocodeine and norhydrocodone are expected    metabolites of hydrocodone. Hydromorphone and dihydrocodeine are    also available as scheduled prescription medications.  ==================================================================== Test                      Result    Flag   Units      Ref Range   Creatinine              113              mg/dL      >=16 ==================================================================== Declared Medications:  The flagging and interpretation on this report are based on the  following declared medications.  Unexpected results may arise from  inaccuracies in the declared medications.    **Note: The testing scope of this panel includes these medications:   Hydrocodone (Norco)   **Note: The testing scope of this panel does not include the  following reported medications:   Acetaminophen (Norco)  Albuterol (Ventolin HFA)  Aspirin  Atorvastatin (Lipitor)  Docusate (Colace)  Esomeprazole (Nexium)  Famotidine (Pepcid)  Gabapentin (Neurontin)  Hydrochlorothiazide (Zestoretic)  Levothyroxine (Synthroid)  Lisinopril (Zestoretic)  Ondansetron (Zofran)  Polyethylene Glycol (GlycoLax)  Rizatriptan (Maxalt)  Sertraline (Zoloft) ==================================================================== For clinical consultation, please call [phone]. ====================================================================       ROS  Constitutional: Denies any fever or chills Gastrointestinal: No reported hemesis, hematochezia, vomiting, or acute GI distress Musculoskeletal:  Left knee pain right knee pain as well Neurological: No reported episodes of acute onset apraxia, aphasia, dysarthria, agnosia, amnesia, paralysis, loss of coordination, or loss of consciousness  Medication Review  HYDROcodone-acetaminophen, albuterol, aspirin EC, atorvastatin, docusate sodium, esomeprazole, famotidine, furosemide, gabapentin, levothyroxine, lisinopril-hydrochlorothiazide, naloxegol oxalate, ondansetron, polyethylene glycol powder, potassium chloride SA, rizatriptan, and sertraline  History Review  Allergy: [name] has No Known Allergies. Drug: [name]  reports no history of drug use. Alcohol:  reports no history of alcohol use. Tobacco:  reports that she quit smoking about 4 years ago. Her smoking use included cigarettes. She started smoking about 54 years ago. She has a 25 pack-year smoking history. She has quit using smokeless tobacco. Social: [name]  reports that she quit smoking about 4 years ago. Her smoking use included cigarettes. She started smoking about 54  years ago. She has a 25 pack-year smoking history. She has quit using smokeless tobacco. She reports that she does not drink alcohol and does not use drugs. Medical:  has a past medical history of Abnormal EKG, CAD (coronary artery disease), Cancer (HCC), COPD (chronic obstructive pulmonary disease) (HCC), Endometrial adenocarcinoma (HCC) (2011), Hyperlipidemia, Hypertension, Osteoarthritis, Reflux gastritis, Thyroid disease, tobacco abuse, and Vaginal delivery. Surgical: Ms. Neveu  has a past surgical history that includes Thyroidectomy (2005); Cardiac catheterization (2006); Total abdominal hysterectomy; Breast surgery (Right, March 2014); Breast cyst aspiration (Right, 2014); Esophagogastroduodenoscopy (N/A, 04/07/2020); Esophagogastroduodenoscopy (egd) with propofol (N/A, 11/07/2022); biopsy (11/07/2022); Balloon dilation (11/07/2022); and polypectomy (11/07/2022). Family: family history includes Alcohol abuse in her father, maternal aunt, and mother; Cancer in her brother, brother, brother, and maternal aunt; Heart disease in her brother.  Laboratory Chemistry Profile   Renal Lab Results  Component Value Date   BUN 23 11/28/2022   CREATININE 1.24 (H) 11/28/2022   BCR 19 11/28/2022   GFR 60.34 01/21/2015   GFRAA 61 01/02/2020  GFRNONAA >60 04/07/2020    Hepatic Lab Results  Component Value Date   AST 11 10/28/2022   ALT 4 (L) 10/28/2022   ALBUMIN 3.8 (L) 09/01/2022   ALKPHOS 77 09/01/2022   HCVAB NEGATIVE 01/21/2015   LIPASE 13 (L) 09/01/2022    Electrolytes Lab Results  Component Value Date   NA 140 11/28/2022   K 4.0 11/28/2022   CL 103 11/28/2022   CALCIUM 9.3 11/28/2022   MG 1.3 (L) 09/15/2021    Bone Lab Results  Component Value Date   VD25OH 18 (L) 12/09/2021    Inflammation (CRP: Acute Phase) (ESR: Chronic Phase) Lab Results  Component Value Date   ESRSEDRATE 19 03/30/2012         Note: Above Lab results reviewed.   Physical Exam  General appearance: Well  nourished, well developed, and well hydrated. In no apparent acute distress Mental status: Alert, oriented x 3 (person, place, & time)       Respiratory: No evidence of acute respiratory distress Eyes: PERLA Vitals: BP 118/82   Pulse 93   Temp (!) 97.3 F (36.3 C) (Temporal)   Resp 16   Ht 5\' 8"  (1.727 m)   Wt 190 lb (86.2 kg)   SpO2 98%   BMI 28.89 kg/m  BMI: Estimated body mass index is 28.89 kg/m as calculated from the following:   Height as of this encounter: 5\' 8"  (1.727 m).   Weight as of this encounter: 190 lb (86.2 kg). Ideal: Ideal body weight: 63.9 kg (140 lb 14 oz) Adjusted ideal body weight: 72.8 kg (160 lb 8.4 oz)  Limited range of motion of left knee, pain with weightbearing Arthropathic pain pattern of left knee pain right knee  Assessment   Status Diagnosis  Having a Flare-up Having a Flare-up Persistent 1. Bilateral primary osteoarthritis of knee   2. Primary osteoarthritis of left knee   3. Chronic pain of right ankle   4. Chronic pain syndrome   5. Chronic radicular lumbar pain (left)   6. Plantar fascia syndrome        Plan of Care   Requested Prescriptions   Signed Prescriptions Disp Refills   HYDROcodone-acetaminophen (NORCO) 7.5-325 MG tablet 105 tablet 0    Sig: Take 1 tablet by mouth every 6 (six) hours as needed for severe pain. Must last 30 days.   HYDROcodone-acetaminophen (NORCO) 7.5-325 MG tablet 105 tablet 0    Sig: Take 1 tablet by mouth every 6 (six) hours as needed for severe pain. Must last 30 days.   HYDROcodone-acetaminophen (NORCO) 7.5-325 MG tablet 105 tablet 0    Sig: Take 1 tablet by mouth every 6 (six) hours as needed for severe pain. Must last 30 days.   gabapentin (NEURONTIN) 300 MG capsule 180 capsule 2    Sig: Take 1-2 capsules (300-600 mg total) by mouth at bedtime.  Continue with gabapentin as prescribed, no refills needed.  Orders Placed This Encounter  Procedures   KNEE INJECTION    Local Anesthetic & Steroid  injection.    Standing Status:   Future    Standing Expiration Date:   04/14/2023    Scheduling Instructions:     Side: Bilateral     Sedation: None     Timeframe: As soon as schedule allows    Order Specific Question:   Where will this procedure be performed?    Answer:   [facility]     Follow-up plan:   Return in about 2  weeks (around 01/26/2023) for B/L Knee Kennalog (ER).     Status post left genicular RFA 04/16/2018, status post right genicular RFA 03/12/2018.  Provided her with benefit.  Status post left knee intra-articular steroid injection #3 on 11/21/2018.   hyalgan left knee #1 on 04/17/19, #2 05/22/19, #3 07/03/2019, #4 08/28/19, #5 on 09/25/19             Recent Visits No visits were found meeting these conditions. Showing recent visits within past 90 days and meeting all other requirements Today's Visits Date Type Provider Dept  01/12/23 Office Visit Edward Jolly, MD [facility]  Showing today's visits and meeting all other requirements Future Appointments No visits were found meeting these conditions. Showing future appointments within next 90 days and meeting all other requirements  I discussed the assessment and treatment plan with the patient. The patient was provided an opportunity to ask questions and all were answered. The patient agreed with the plan and demonstrated an understanding of the instructions.  Patient advised to call back or seek an in-person evaluation if the symptoms or condition worsens.  Duration of encounter: .  Note by: Edward Jolly, MD Date: 01/12/2023; Time: 11:16 AM

## 2023-01-12 NOTE — Progress Notes (Signed)
Nursing Pain Medication Assessment:  Safety precautions to be maintained throughout the outpatient stay will include: orient to surroundings, keep bed in low position, maintain call bell within reach at all times, provide assistance with transfer out of bed and ambulation.  Medication Inspection Compliance: Pill count conducted under aseptic conditions, in front of the patient. Neither the pills nor the bottle was removed from the patient's sight at any time. Once count was completed pills were immediately returned to the patient in their original bottle.  Medication: Hydrocodone/APAP Pill/Patch Count: 20 pills out of 105 Pill/Patch Appearance: Markings consistent with prescribed medication Bottle Appearance: Standard pharmacy container. Clearly labeled. Filled Date: 09 / 10 / 2024 Last Medication intake:  TodaySafety precautions to be maintained throughout the outpatient stay will include: orient to surroundings, keep bed in low position, maintain call bell within reach at all times, provide assistance with transfer out of bed and ambulation.

## 2023-01-19 ENCOUNTER — Other Ambulatory Visit: Payer: Self-pay | Admitting: Internal Medicine

## 2023-01-19 NOTE — Telephone Encounter (Signed)
Requested Prescriptions  Pending Prescriptions Disp Refills   lisinopril-hydrochlorothiazide (ZESTORETIC) 20-12.5 MG tablet [Pharmacy Med Name: LISINOPRIL-HCTZ 20-12.5 MG TAB] 90 tablet 1    Sig: TAKE 1 TABLET BY MOUTH ONCE DAILY     Cardiovascular:  ACEI + Diuretic Combos Failed - 01/19/2023  1:45 PM      Failed - Cr in normal range and within 180 days    Creat  Date Value Ref Range Status  11/28/2022 1.24 (H) 0.60 - 1.00 mg/dL Final         Passed - Na in normal range and within 180 days    Sodium  Date Value Ref Range Status  11/28/2022 140 135 - 146 mmol/L Final  09/01/2022 142 134 - 144 mmol/L Final         Passed - K in normal range and within 180 days    Potassium  Date Value Ref Range Status  11/28/2022 4.0 3.5 - 5.3 mmol/L Final         Passed - eGFR is 30 or above and within 180 days    GFR, Est African American  Date Value Ref Range Status  01/02/2020 61 > OR = 60 mL/min/1.51m2 Final   GFR, Est Non African American  Date Value Ref Range Status  01/02/2020 52 (L) > OR = 60 mL/min/1.41m2 Final   GFR, Estimated  Date Value Ref Range Status  04/07/2020 >60 >60 mL/min Final    Comment:    (NOTE) Calculated using the CKD-EPI Creatinine Equation (2021)    GFR  Date Value Ref Range Status  01/21/2015 60.34 >60.00 mL/min Final   eGFR  Date Value Ref Range Status  11/28/2022 47 (L) > OR = 60 mL/min/1.11m2 Final  09/01/2022 52 (L) >59 mL/min/1.73 Final         Passed - Patient is not pregnant      Passed - Last BP in normal range    BP Readings from Last 1 Encounters:  01/12/23 118/82         Passed - Valid encounter within last 6 months    Recent Outpatient Visits           1 month ago Peripheral edema   [place] [facility] [place], Salvadore Oxford, NP   2 months ago Encounter for general adult medical examination with abnormal findings   [place] [facility] [place], Salvadore Oxford, NP   2 months ago Essential hypertension    [place] [facility] Delles, Jackelyn Poling, RPH-CPP   5 months ago Essential hypertension   [place] [facility] Delles, Gentry Fitz A, RPH-CPP   9 months ago Chronic obstructive pulmonary disease with acute exacerbation [facility])   [place] [facility] [place], Salvadore Oxford, NP       Future Appointments             In 3 months Baity, Salvadore Oxford, NP [place] [facility], [facility]

## 2023-01-23 ENCOUNTER — Ambulatory Visit: Payer: Medicare HMO | Admitting: Student in an Organized Health Care Education/Training Program

## 2023-02-10 ENCOUNTER — Other Ambulatory Visit: Payer: Self-pay | Admitting: Internal Medicine

## 2023-02-10 NOTE — Telephone Encounter (Signed)
Labs in date  Requested Prescriptions  Pending Prescriptions Disp Refills   atorvastatin (LIPITOR) 10 MG tablet [Pharmacy Med Name: ATORVASTATIN CALCIUM 10 MG TAB] 90 tablet 1    Sig: TAKE 1 TABLET BY MOUTH ONCE DAILY     Cardiovascular:  Antilipid - Statins Failed - 02/10/2023  3:43 PM      Failed - Lipid Panel in normal range within the last 12 months    Cholesterol  Date Value Ref Range Status  10/28/2022 125 <200 mg/dL Final   LDL Cholesterol (Calc)  Date Value Ref Range Status  10/28/2022 61 mg/dL (calc) Final    Comment:    Reference range: <100 . Desirable range <100 mg/dL for primary prevention;   <70 mg/dL for patients with CHD or diabetic patients  with > or = 2 CHD risk factors. [name] LDL-C is now calculated using the Martin-Hopkins  calculation, which is a validated novel method providing  better accuracy than the Friedewald equation in the  estimation of LDL-C.  Horald Pollen et al. Lenox Ahr. 8657;846(96): 2061-2068  (http://education.[employer].com/faq/FAQ164)    Direct LDL  Date Value Ref Range Status  01/21/2015 144.0 mg/dL Final    Comment:    Optimal:  <100 mg/dLNear or Above Optimal:  100-129 mg/dLBorderline High:  130-159 mg/dLHigh:  160-189 mg/dLVery High:  >190 mg/dL   HDL  Date Value Ref Range Status  10/28/2022 43 (L) > OR = 50 mg/dL Final   Triglycerides  Date Value Ref Range Status  10/28/2022 124 <150 mg/dL Final         Passed - Patient is not pregnant      Passed - Valid encounter within last 12 months    Recent Outpatient Visits           2 months ago Peripheral edema   [place] [facility] [place], Salvadore Oxford, NP   3 months ago Encounter for general adult medical examination with abnormal findings   [place] [facility] [place], Salvadore Oxford, NP   3 months ago Essential hypertension   [place] [facility] Delles, Gentry Fitz A, RPH-CPP   5 months ago Essential hypertension   [place]  [facility], Gentry Fitz A, RPH-CPP   10 months ago Chronic obstructive pulmonary disease with acute exacerbation [facility])   [place] [facility] [place], Salvadore Oxford, NP       Future Appointments             In 2 months Baity, Salvadore Oxford, NP [place] [facility], [facility]

## 2023-02-15 ENCOUNTER — Ambulatory Visit
Payer: Medicare HMO | Attending: Student in an Organized Health Care Education/Training Program | Admitting: Student in an Organized Health Care Education/Training Program

## 2023-02-15 ENCOUNTER — Encounter: Payer: Self-pay | Admitting: Student in an Organized Health Care Education/Training Program

## 2023-02-15 VITALS — BP 96/86 | HR 108 | Temp 97.0°F | Resp 16 | Ht 68.5 in | Wt 190.0 lb

## 2023-02-15 DIAGNOSIS — M170 Bilateral primary osteoarthritis of knee: Secondary | ICD-10-CM

## 2023-02-15 DIAGNOSIS — T402X5A Adverse effect of other opioids, initial encounter: Secondary | ICD-10-CM | POA: Diagnosis not present

## 2023-02-15 DIAGNOSIS — K5903 Drug induced constipation: Secondary | ICD-10-CM | POA: Diagnosis not present

## 2023-02-15 DIAGNOSIS — M1712 Unilateral primary osteoarthritis, left knee: Secondary | ICD-10-CM | POA: Diagnosis not present

## 2023-02-15 MED ORDER — LINACLOTIDE 145 MCG PO CAPS
145.0000 ug | ORAL_CAPSULE | Freq: Every day | ORAL | 2 refills | Status: DC
Start: 2023-02-15 — End: 2023-11-17

## 2023-02-15 MED ORDER — TRIAMCINOLONE ACETONIDE 32 MG IX SRER
32.0000 mg | Freq: Once | INTRA_ARTICULAR | Status: AC
  Administered 2023-02-15: 32 mg via INTRA_ARTICULAR
  Filled 2023-02-15: qty 5

## 2023-02-15 MED ORDER — TRIAMCINOLONE ACETONIDE 32 MG IX SRER
32.0000 mg | Freq: Once | INTRA_ARTICULAR | Status: AC
  Administered 2023-02-15: 32 mg via INTRA_ARTICULAR
  Filled 2023-02-15 (×2): qty 5

## 2023-02-15 MED ORDER — LIDOCAINE HCL 2 % IJ SOLN
20.0000 mL | Freq: Once | INTRAMUSCULAR | Status: AC
  Administered 2023-02-15: 400 mg
  Filled 2023-02-15: qty 20

## 2023-02-15 NOTE — Patient Instructions (Signed)

## 2023-02-15 NOTE — Progress Notes (Signed)
Safety precautions to be maintained throughout the outpatient stay will include: orient to surroundings, keep bed in low position, maintain call bell within reach at all times, provide assistance with transfer out of bed and ambulation.  

## 2023-02-15 NOTE — Progress Notes (Signed)
PROVIDER NOTE: Interpretation of information contained herein should be left to medically-trained personnel. Specific patient instructions are provided elsewhere under "Patient Instructions" section of medical record. This document was created in part using STT-dictation technology, any transcriptional errors that may result from this process are unintentional.  Patient: Pamela Romero Type: Established DOB: 11-12-1950 MRN: [id number] PCP: Lorre Munroe, NP  Service: Procedure DOS: 02/15/2023 Setting: Ambulatory Location: Ambulatory outpatient facility Delivery: Face-to-face Provider: Edward Jolly, MD Specialty: Interventional Pain Management Specialty designation: 09 Location: Outpatient facility Ref. Prov.: Lorre Munroe, NP   Procedure [facility] ([place]) Interventional Pain Management )    Procedure: Steroid Knee Injection (LONG ACTING Kennalog)  Laterality: Bilateral (-50) Level: Intra-articular  No.: R6 L7 Series: n/a Purpose: Therapeutic Indications: Knee arthralgia associated to osteoarthritis of the knee   Imaging: None required (CPT-20610) Analgesia: Skin infiltration w/ local anesthetics Sedation: None  NAS-11 score:   Pre-procedure: 7 /10   Post-procedure: 7 /10      1. Bilateral primary osteoarthritis of knee   2. Primary osteoarthritis of left knee   3. Therapeutic opioid induced constipation    Pre-Procedure Preparation  Monitoring: As per clinic protocol.  Risk Assessment: Vitals:  NWG:[id number] body mass index is 28.47 kg/m as calculated from the following:   Height as of this encounter: 5' 8.5" (1.74 m).   Weight as of this encounter: 190 lb (86.2 kg)., Rate:(!) 108 , BP:96/86, Resp:16, Temp:(!) 97 F (36.1 C), SpO2:100 %  Allergies: She has No Known Allergies.  Precautions: None required  Blood-thinner(s): None at this time  Coagulopathies: Reviewed. None identified.   Active Infection(s): Reviewed. None identified. [name] is afebrile   Location  setting: Exam room Position: Sitting w/ knee bent 90 degrees Safety Precautions: Patient was assessed for positional comfort and pressure points before starting the procedure. Prepping solution: DuraPrep (Iodine Povacrylex [0.7% available iodine] and Isopropyl Alcohol, 74% w/w) Prep Area: Entire knee region Approach: percutaneous, just above the tibial plateau, medial to the infrapatellar tendon. Intended target: Intra-articular knee space Materials: Tray: Block Needle(s): Regular Qty: 1/side Length: 1.5-inch Gauge: 25G   Meds ordered this encounter  Medications   lidocaine (XYLOCAINE) 2 % (with pres) injection 400 mg   Triamcinolone Acetonide (ZILRETTA) intra-articular injection 32 mg    Maintain refrigerated.  Prepared suspension may be stored up to 4 hours at ambient conditions.   Triamcinolone Acetonide (ZILRETTA) intra-articular injection 32 mg    Maintain refrigerated.  Prepared suspension may be stored up to 4 hours at ambient conditions.   linaclotide (LINZESS) 145 MCG CAPS capsule    Sig: Take 1 capsule (145 mcg total) by mouth daily before breakfast. As needed.    Dispense:  30 capsule    Refill:  2    Fill one day early if pharmacy is closed on scheduled refill date. Generic permitted. Do not send renewal requests.     No orders of the defined types were placed in this encounter.     Time-out: 1105 I initiated and conducted the "Time-out" before starting the procedure, as per protocol. The patient was asked to participate by confirming the accuracy of the "Time Out" information. Verification of the correct person, site, and procedure were performed and confirmed by me, the nursing staff, and the patient. "Time-out" conducted as per Joint Commission's Universal Protocol (UP.01.01.01). Procedure checklist: Completed   H&P (Pre-op  Assessment)  Pamela Romero is a 72 y.o. (year old), female patient, seen today for interventional treatment. She  has a past  surgical history  that includes Thyroidectomy (2005); Cardiac catheterization (2006); Total abdominal hysterectomy; Breast surgery (Right, March 2014); Breast cyst aspiration (Right, 2014); Esophagogastroduodenoscopy (N/A, 04/07/2020); Esophagogastroduodenoscopy (egd) with propofol (N/A, 11/07/2022); biopsy (11/07/2022); Balloon dilation (11/07/2022); and polypectomy (11/07/2022). [name] has a current medication list which includes the following prescription(s): albuterol, aspirin ec, atorvastatin, docusate sodium, esomeprazole, famotidine, furosemide, gabapentin, hydrocodone-acetaminophen, [START ON 02/18/2023] hydrocodone-acetaminophen, [START ON 03/20/2023] hydrocodone-acetaminophen, levothyroxine, linaclotide, lisinopril-hydrochlorothiazide, naloxegol oxalate, ondansetron, polyethylene glycol powder, potassium chloride sa, rizatriptan, and sertraline. Her primarily concern today is the Knee Pain (bilateral)   She has No Known Allergies.   Last encounter: My last encounter with her was on 11/03/2020. Pertinent problems: Pamela Romero has Bilateral primary osteoarthritis of knee and Osteoporosis on their pertinent problem list. Pain Assessment: Severity of Chronic pain is reported as a 7 /10. Location: Knee Right/down below knees bilateral to feet, effects all toes. Onset: More than a month ago. Quality: Aching, Discomfort, Stabbing, Numbness, Constant. Timing: Constant. Modifying factor(s): meds, rest, elevate legs. Vitals:  height is 5' 8.5" (1.74 m) and weight is 190 lb (86.2 kg). Her temperature is 97 F (36.1 C) (abnormal). Her blood pressure is 96/86 and her pulse is 108 (abnormal). Her respiration is 16 and oxygen saturation is 100%.   Reason for encounter: "interventional pain management therapy due pain of at least four (4) weeks in duration, with failure to respond and/or inability to tolerate more conservative care.     Related imaging: Knee-L MR wo contrast:  Results for orders placed during the hospital  encounter of 09/01/17  MR KNEE LEFT WO CONTRAST  Narrative CLINICAL DATA:  Persistent anterior knee pain for 1.5 months.  EXAM: MRI OF THE LEFT KNEE WITHOUT CONTRAST  TECHNIQUE: Multiplanar, multisequence MR imaging of the knee was performed. No intravenous contrast was administered.  COMPARISON:  None.  FINDINGS: MENISCI  Medial meniscus: There is an extensive horizontal tear posterior horn extending from the superior surface to the periphery. The midbody is degenerated and peripherally subluxed.  Lateral meniscus: Extensive horizontal tear involving the anterior horn and midbody with small parameniscal cysts at the level of the midbody best seen on image 34 of series 7 and series 31 of series 5.  LIGAMENTS  Cruciates:  Intact.  Collaterals:  Intact.  CARTILAGE  Patellofemoral: Partial-thickness cartilage loss of the superior aspect of the medial facet of the patella.  Medial: Denuding of the articular cartilage of the medial tibial plateau with subcortical edema and subcortical cyst formation in the periphery of the tibial plateau. Thinning of the articular cartilage of the femoral condyle.  Lateral: Focal full-thickness cartilage loss in the posterior aspect of the lateral tibial plateau.  Joint: Trace joint effusion. Normal Hoffa's fat pad. No plical thickening.  Popliteal Fossa:  Small Baker's cyst.  Intact popliteus tendon.  Extensor Mechanism:  Normal.  Bones: Moderate marginal osteophytes in the medial compartment. Tiny marginal osteophytes in the lateral compartment.  Other: None  IMPRESSION: 1. Extensive horizontal tear of the posterior horn of the medial meniscus. 2. Extensive horizontal tear of the anterior horn and midbody of the lateral meniscus. 3. Moderate osteoarthritis of the medial compartment with full-thickness cartilage loss and subcortical edema and subcortical cyst formation in the medial tibial plateau.   Electronically  Signed By: Francene Boyers M.D. On: 09/01/2017 10:59 \    Site Confirmation: Ms. Casebolt was asked to confirm the procedure and laterality before marking the site.  Consent: Before the procedure and under the influence of  no sedative(s), amnesic(s), or anxiolytics, the patient was informed of the treatment options, risks and possible complications. To fulfill our ethical and legal obligations, as recommended by the American Medical Association's Code of Ethics, I have informed the patient of my clinical impression; the nature and purpose of the treatment or procedure; the risks, benefits, and possible complications of the intervention; the alternatives, including doing nothing; the risk(s) and benefit(s) of the alternative treatment(s) or procedure(s); and the risk(s) and benefit(s) of doing nothing. The patient was provided information about the general risks and possible complications associated with the procedure. These may include, but are not limited to: failure to achieve desired goals, infection, bleeding, organ or nerve damage, allergic reactions, paralysis, and death. In addition, the patient was informed of those risks and complications associated to Spine-related procedures, such as failure to decrease pain; infection (i.e.: Meningitis, epidural or intraspinal abscess); bleeding (i.e.: epidural hematoma, subarachnoid hemorrhage, or any other type of intraspinal or peri-dural bleeding); organ or nerve damage (i.e.: Any type of peripheral nerve, nerve root, or spinal cord injury) with subsequent damage to sensory, motor, and/or autonomic systems, resulting in permanent pain, numbness, and/or weakness of one or several areas of the body; allergic reactions; (i.e.: anaphylactic reaction); and/or death. Furthermore, the patient was informed of those risks and complications associated with the medications. These include, but are not limited to: allergic reactions (i.e.: anaphylactic or anaphylactoid  reaction(s)); adrenal axis suppression; blood sugar elevation that in diabetics may result in ketoacidosis or comma; water retention that in patients with history of congestive heart failure may result in shortness of breath, pulmonary edema, and decompensation with resultant heart failure; weight gain; swelling or edema; medication-induced neural toxicity; particulate matter embolism and blood vessel occlusion with resultant organ, and/or nervous system infarction; and/or aseptic necrosis of one or more joints. Finally, the patient was informed that Medicine is not an exact science; therefore, there is also the possibility of unforeseen or unpredictable risks and/or possible complications that may result in a catastrophic outcome. The patient indicated having understood very clearly. We have given the patient no guarantees and we have made no promises. Enough time was given to the patient to ask questions, all of which were answered to the patient's satisfaction. Ms. Pingley has indicated that she wanted to continue with the procedure. Attestation: I, the ordering provider, attest that I have discussed with the patient the benefits, risks, side-effects, alternatives, likelihood of achieving goals, and potential problems during recovery for the procedure that I have provided informed consent.  Date  Time: 02/15/2023 10:19 AM   Prophylactic antibiotics  Anti-infectives (From admission, onward)    None      Indication(s): None identified   Description of procedure   Start Time: 1105 hrs  Local Anesthesia: Once the patient was positioned, prepped, and time-out was completed. The target area was identified located. The skin was marked with an approved surgical skin marker. Once marked, the skin (epidermis, dermis, and hypodermis), and deeper tissues (fat, connective tissue and muscle) were infiltrated with a small amount of a short-acting local anesthetic, loaded on a 10cc syringe with a 25G, 1.5-in   Needle. An appropriate amount of time was allowed for local anesthetics to take effect before proceeding to the next step. Local Anesthetic: Lidocaine 1-2% The unused portion of the local anesthetic was discarded in the proper designated containers. Safety Precautions: Aspiration looking for blood return was conducted prior to all injections. At no point did I inject any substances, as a  needle was being advanced. Before injecting, the patient was told to immediately notify me if she was experiencing any new onset of "ringing in the ears, or metallic taste in the mouth". No attempts were made at seeking any paresthesias. Safe injection practices and needle disposal techniques used. Medications properly checked for expiration dates. SDV (single dose vial) medications used. After the completion of the procedure, all disposable equipment used was discarded in the proper designated medical waste containers.  Technical description: Protocol guidelines were followed. After positioning, the target area was identified and prepped in the usual manner. Skin & deeper tissues infiltrated with local anesthetic. Appropriate amount of time allowed to pass for local anesthetics to take effect. Proper needle placement secured. Once satisfactory needle placement was confirmed, I proceeded to inject the desired solution in slow, incremental fashion, intermittently assessing for discomfort or any signs of abnormal or undesired spread of substance. Once completed, the needle was removed and disposed of, as per hospital protocols. The area was cleaned, making sure to leave some of the prepping solution back to take advantage of its long term bactericidal properties.  Aspiration:  Negative   Vitals:   02/15/23 1021  BP: 96/86  Pulse: (!) 108  Resp: 16  Temp: (!) 97 F (36.1 C)  SpO2: 100%  Weight: 190 lb (86.2 kg)  Height: 5' 8.5" (1.74 m)      End Time: 1107 hrs     Post-op assessment  Post-procedure Vital  Signs:  Pulse/HCG Rate: (!) 108  Temp: (!) 97 F (36.1 C)  Resp: 16 BP: 96/86 SpO2: 100 %  EBL: None  Complications: No immediate post-treatment complications observed by team, or reported by patient.  Note: The patient tolerated the entire procedure well. A repeat set of vitals were taken after the procedure and the patient was kept under observation following institutional policy, for this type of procedure. Post-procedural neurological assessment was performed, showing return to baseline, prior to discharge. The patient was provided with post-procedure discharge instructions, including a section on how to identify potential problems. Should any problems arise concerning this procedure, the patient was given instructions to immediately contact us, at any time, without hesitation. In any case, we plan to contact the patient by telephone for a follow-up status report regarding this interventional procedure.  Comments:  No additional relevant information.   Plan of care  Chronic Opioid Analgesic:  Hydrocodone 7.5 mg 3 times daily as needed with an extra quantity 15/month (#105)For severe breakthrough pain    Pts states she is struggling with constipation, refractory to Colace, Miralax Linzess as below  Requested Prescriptions   Signed Prescriptions Disp Refills   linaclotide (LINZESS) 145 MCG CAPS capsule 30 capsule 2    Sig: Take 1 capsule (145 mcg total) by mouth daily before breakfast. As needed.      Medications administered: We administered lidocaine, Triamcinolone Acetonide, and Triamcinolone Acetonide.  5 cc of ER triamcinolone injected in each knee.  Follow-up plan:   No follow-ups on file.     Recent Visits Date Type Provider Dept  01/12/23 Office Visit Edward Jolly, MD [facility]  Showing recent visits within past 90 days and meeting all other requirements Today's Visits Date Type Provider Dept  02/15/23 Procedure visit Edward Jolly, MD [facility]  Showing today's visits and meeting all other requirements Future Appointments Date Type Provider Dept  04/11/23 Appointment Edward Jolly, MD [facility]  Showing future appointments within next 90 days and meeting  all other requirements   Disposition: Discharge home  Discharge (Date  Time): 02/15/2023; 1108 hrs.   Primary Care Physician: Lorre Munroe, NP Location: [facility] Note by: Edward Jolly, MD Date: 02/15/2023; Time: 11:17 AM  DISCLAIMER: Medicine is not an exact science. It has no guarantees or warranties. The decision to proceed with this intervention was based on the information collected from the patient. Conclusions were drawn from the patient's questionnaire, interview, and examination. Because information was provided in large part by the patient, it cannot be guaranteed that it has not been purposely or unconsciously manipulated or altered. Every effort has been made to obtain as much accurate, relevant, available data as possible. Always take into account that the treatment will also be dependent on availability of resources and existing treatment guidelines, considered by other Pain Management Specialists as being common knowledge and practice, at the time of the intervention. It is also important to point out that variation in procedural techniques and pharmacological choices are the acceptable norm. For Medico-Legal review purposes, the indications, contraindications, technique, and results of the these procedures should only be evaluated, judged and interpreted by a Board-Certified Interventional Pain Specialist with extensive familiarity and expertise in the same exact procedure and technique.

## 2023-02-16 ENCOUNTER — Telehealth: Payer: Self-pay | Admitting: *Deleted

## 2023-02-16 NOTE — Telephone Encounter (Signed)
Attempted to call for post procedure follow-up. No answer, hung up after several rings.

## 2023-04-11 ENCOUNTER — Encounter: Payer: Medicare HMO | Admitting: Student in an Organized Health Care Education/Training Program

## 2023-04-18 ENCOUNTER — Ambulatory Visit
Payer: Medicare HMO | Attending: Student in an Organized Health Care Education/Training Program | Admitting: Student in an Organized Health Care Education/Training Program

## 2023-04-18 ENCOUNTER — Encounter: Payer: Self-pay | Admitting: Student in an Organized Health Care Education/Training Program

## 2023-04-18 VITALS — BP 97/69 | HR 108 | Temp 98.0°F | Resp 16 | Ht 68.5 in | Wt 184.0 lb

## 2023-04-18 DIAGNOSIS — M48062 Spinal stenosis, lumbar region with neurogenic claudication: Secondary | ICD-10-CM | POA: Diagnosis not present

## 2023-04-18 DIAGNOSIS — G8929 Other chronic pain: Secondary | ICD-10-CM | POA: Diagnosis not present

## 2023-04-18 DIAGNOSIS — M5416 Radiculopathy, lumbar region: Secondary | ICD-10-CM | POA: Diagnosis not present

## 2023-04-18 DIAGNOSIS — M1712 Unilateral primary osteoarthritis, left knee: Secondary | ICD-10-CM | POA: Diagnosis not present

## 2023-04-18 DIAGNOSIS — M170 Bilateral primary osteoarthritis of knee: Secondary | ICD-10-CM | POA: Insufficient documentation

## 2023-04-18 DIAGNOSIS — G894 Chronic pain syndrome: Secondary | ICD-10-CM | POA: Insufficient documentation

## 2023-04-18 MED ORDER — HYDROCODONE-ACETAMINOPHEN 7.5-325 MG PO TABS: 1.0000 | ORAL_TABLET | Freq: Four times a day (QID) | ORAL | 0 refills | Status: DC | PRN

## 2023-04-18 MED ORDER — HYDROCODONE-ACETAMINOPHEN 7.5-325 MG PO TABS: 1.0000 | ORAL_TABLET | Freq: Four times a day (QID) | ORAL | 0 refills | Status: AC | PRN

## 2023-04-18 NOTE — Progress Notes (Signed)
 Nursing Pain Medication Assessment:  Safety precautions to be maintained throughout the outpatient stay will include: orient to surroundings, keep bed in low position, maintain call bell within reach at all times, provide assistance with transfer out of bed and ambulation.  Medication Inspection Compliance: Pill count conducted under aseptic conditions, in front of the patient. Neither the pills nor the bottle was removed from the patient's sight at any time. Once count was completed pills were immediately returned to the patient in their original bottle.  Medication: Hydrocodone /APAP Pill/Patch Count:  2 of 105 pills remain Pill/Patch Appearance: Markings consistent with prescribed medication Bottle Appearance: Standard pharmacy container. Clearly labeled. Filled Date: 44 / 54 / 2025 Last Medication intake:  Today

## 2023-04-18 NOTE — Progress Notes (Signed)
 PROVIDER NOTE: Information contained herein reflects review and annotations entered in association with encounter. Interpretation of such information and data should be left to medically-trained personnel. Information provided to patient can be located elsewhere in the medical record under Patient Instructions. Document created using STT-dictation technology, any transcriptional errors that may result from process are unintentional.    Patient: Pamela Romero  Service Category: E/M  Provider: Wallie Sherry, MD  DOB: 1950/10/27  DOS: 04/18/2023  Referring Provider: Antonette Angeline ORN, NP  MRN: [id number]  Specialty: Interventional Pain Management  PCP: Antonette Angeline ORN, NP  Type: Established Patient  Setting: Ambulatory outpatient    Location: Office  Delivery: Face-to-face     HPI  [name], a 73 y.o. 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 [name] primary complain today is Knee Pain (left)  Pertinent problems: [name] has Bilateral primary osteoarthritis of knee and Osteoporosis on their pertinent problem list. Pain Assessment: Severity of Chronic pain is reported as a 7 /10. Location:  (back radiates to hips bilateral  down back of legs to ankles) Left/Down leg top of foot. Onset: More than a month ago. Quality: Aching, Sore, Restless. Timing: Constant. Modifying factor(s): rest, procedures. Vitals:  height is 5' 8.5 (1.74 m) and weight is 184 lb (83.5 kg). Her temperature is 98 F (36.7 C). Her blood pressure is 97/69 and her pulse is 108 (abnormal). Her respiration is 16 and oxygen saturation is 100%.  BMI: Estimated body mass index is 27.57 kg/m as calculated from the following:   Height as of this encounter: 5' 8.5 (1.74 m).   Weight as of this encounter: 184 lb (83.5 kg). Last encounter: 01/12/2023. Last procedure: 02/15/2023.  Reason for encounter: both, medication management and  post-procedure evaluation and assessment.   Discussed the use of AI scribe software for clinical note transcription with the patient, who gave verbal consent to proceed.  History of Present Illness   The patient, with a history of chronic knee pain, presents with worsening symptoms over the past couple of weeks. She reports that her knees have been causing significant discomfort, which is only partially relieved by medication. The relief is temporary and the pain, although tolerable post-medication, persists.  In addition to the knee pain, the patient also reports constant back pain radiating down both legs. This is a new complaint that has not been previously addressed. The back pain and leg weakness have been significantly impacting her daily activities, such as cooking and washing dishes, often requiring her to sit down due to the discomfort and weakness.  The patient also reports continued weight loss and fatigue, which she believes may be contributing to her overall feeling of weakness and lack of energy. Despite these challenges, the patient has been trying to maintain an active lifestyle, including working three days a week.  The patient is currently on medication for her knee pain, but reports running low with only two pills left. She has not tried any interventions for her back pain yet, but has attempted to use a back brace in the past, which did not fit properly.       Post-procedure evaluation     Procedure: Steroid Knee Injection (LONG ACTING Kennalog)  Laterality: Bilateral (-50) Level: Intra-articular  No.: R6 L7 Series: n/a Purpose: Therapeutic Indications: Knee arthralgia associated to osteoarthritis of the knee   Imaging: None required (CPT-20610) Analgesia: Skin infiltration w/ local anesthetics Sedation: None  NAS-11 score:   Pre-procedure: 7 /10   Post-procedure: 7 /10       Effectiveness:  Initial hour after procedure: 100 %  Subsequent 4-6 hours  post-procedure: 100 %  Analgesia past initial 6 hours: 75 % (2.5 weeks)  Ongoing improvement:  Analgesic:  40-50%    Pharmacotherapy Assessment  Analgesic: Hydrocodone  7.5 mg 3 times daily as needed with an extra quantity 15/month (#105)For severe breakthrough pain    Monitoring:  PMP: PDMP reviewed during this encounter.       Pharmacotherapy: No side-effects or adverse reactions reported. Compliance: No problems identified. Effectiveness: Clinically acceptable.  Dorlene Montie FALCON, RN  04/18/2023  1:57 PM  Sign when Signing Visit Nursing Pain Medication Assessment:  Safety precautions to be maintained throughout the outpatient stay will include: orient to surroundings, keep bed in low position, maintain call bell within reach at all times, provide assistance with transfer out of bed and ambulation.  Medication Inspection Compliance: Pill count conducted under aseptic conditions, in front of the patient. Neither the pills nor the bottle was removed from the patient's sight at any time. Once count was completed pills were immediately returned to the patient in their original bottle.  Medication: Hydrocodone /APAP Pill/Patch Count:  2 of 105 pills remain Pill/Patch Appearance: Markings consistent with prescribed medication Bottle Appearance: Standard pharmacy container. Clearly labeled. Filled Date: 31 / 93 / 2025 Last Medication intake:  Today  No results found for: CBDTHCR No results found for: D8THCCBX No results found for: D9THCCBX  UDS:  Summary  Date Value Ref Range Status  10/12/2022 Note  Final    Comment:    ==================================================================== [employer] Select 13 (MW) ==================================================================== Test                             Result       Flag       Units  Drug Present and Declared for Prescription Verification   Hydrocodone                     4019         EXPECTED   ng/mg creat    Hydromorphone                  840          EXPECTED   ng/mg creat   Dihydrocodeine                 793          EXPECTED   ng/mg creat   Norhydrocodone                 3106         EXPECTED   ng/mg creat    Sources of hydrocodone  include scheduled prescription medications.    Hydromorphone, dihydrocodeine and norhydrocodone are expected    metabolites of hydrocodone . Hydromorphone and dihydrocodeine are    also available as scheduled prescription medications.  ==================================================================== Test                      Result    Flag   Units      Ref Range   Creatinine              113              mg/dL      >=79 ==================================================================== Declared Medications:  The flagging and  interpretation on this report are based on the  following declared medications.  Unexpected results may arise from  inaccuracies in the declared medications.   **Note: The testing scope of this panel includes these medications:   Hydrocodone  (Norco)   **Note: The testing scope of this panel does not include the  following reported medications:   Acetaminophen  (Norco)  Albuterol  (Ventolin  HFA)  Aspirin   Atorvastatin  (Lipitor)  Docusate (Colace)  Esomeprazole  (Nexium )  Famotidine  (Pepcid )  Gabapentin  (Neurontin )  Hydrochlorothiazide  (Zestoretic )  Levothyroxine  (Synthroid )  Lisinopril  (Zestoretic )  Ondansetron  (Zofran )  Polyethylene Glycol (GlycoLax )  Rizatriptan  (Maxalt )  Sertraline  (Zoloft ) ==================================================================== For clinical consultation, please call [phone]. ====================================================================       ROS  Constitutional: Denies any fever or chills Gastrointestinal: No reported hemesis, hematochezia, vomiting, or acute GI distress Musculoskeletal:  LBP--> radiation to left>right leg; left knee pain Neurological: No reported  episodes of acute onset apraxia, aphasia, dysarthria, agnosia, amnesia, paralysis, loss of coordination, or loss of consciousness  Medication Review  HYDROcodone -acetaminophen , albuterol , aspirin  EC, atorvastatin , docusate sodium , esomeprazole , famotidine , furosemide , gabapentin , levothyroxine , linaclotide , lisinopril -hydrochlorothiazide , naloxegol  oxalate, ondansetron , polyethylene glycol powder, potassium chloride  SA, rizatriptan , and sertraline   History Review  Allergy: Ms. Hertzog has no known allergies. Drug: Ms. Mccamish  reports no history of drug use. Alcohol:  reports no history of alcohol use. Tobacco:  reports that she quit smoking about 4 years ago. Her smoking use included cigarettes. She started smoking about 54 years ago. She has a 25 pack-year smoking history. She has quit using smokeless tobacco. Social: Ms. Melott  reports that she quit smoking about 4 years ago. Her smoking use included cigarettes. She started smoking about 54 years ago. She has a 25 pack-year smoking history. She has quit using smokeless tobacco. She reports that she does not drink alcohol and does not use drugs. Medical:  has a past medical history of Abnormal EKG, CAD (coronary artery disease), Cancer (HCC), COPD (chronic obstructive pulmonary disease) (HCC), Endometrial adenocarcinoma (HCC) (2011), Hyperlipidemia, Hypertension, Osteoarthritis, Reflux gastritis, Thyroid  disease, tobacco abuse, and Vaginal delivery. Surgical: Ms. Calix  has a past surgical history that includes Thyroidectomy (2005); Cardiac catheterization (2006); Total abdominal hysterectomy; Breast surgery (Right, March 2014); Breast cyst aspiration (Right, 2014); Esophagogastroduodenoscopy (N/A, 04/07/2020); Esophagogastroduodenoscopy (egd) with propofol  (N/A, 11/07/2022); biopsy (11/07/2022); Balloon dilation (11/07/2022); and polypectomy (11/07/2022). Family: family history includes Alcohol abuse in her father, maternal aunt, and mother;  Cancer in her brother, brother, brother, and maternal aunt; Heart disease in her brother.  Laboratory Chemistry Profile   Renal Lab Results  Component Value Date   BUN 23 11/28/2022   CREATININE 1.24 (H) 11/28/2022   BCR 19 11/28/2022   GFR 60.34 01/21/2015   GFRAA 61 01/02/2020   GFRNONAA >60 04/07/2020    Hepatic Lab Results  Component Value Date   AST 11 10/28/2022   ALT 4 (L) 10/28/2022   ALBUMIN 3.8 (L) 09/01/2022   ALKPHOS 77 09/01/2022   HCVAB NEGATIVE 01/21/2015   LIPASE 13 (L) 09/01/2022    Electrolytes Lab Results  Component Value Date   NA 140 11/28/2022   K 4.0 11/28/2022   CL 103 11/28/2022   CALCIUM  9.3 11/28/2022   MG 1.3 (L) 09/15/2021    Bone Lab Results  Component Value Date   VD25OH 18 (L) 12/09/2021    Inflammation (CRP: Acute Phase) (ESR: Chronic Phase) Lab Results  Component Value Date   ESRSEDRATE 19 03/30/2012         Note: Above Lab results reviewed.  Recent Imaging Review  ECHOCARDIOGRAM COMPLETE    ECHOCARDIOGRAM REPORT       Patient Name:   CHESNEE FLOREN [facility] Date of Exam: 06/22/2022 Medical Rec #:  [id number]        Height:       68.5 in Accession #:    [id number]       Weight:       196.0 lb Date of Birth:  03/10/1951        BSA:          2.037 m Patient Age:    71 years         BP:           148/88 mmHg Patient Gender: F                HR:           90 bpm. Exam Location:  [facility]  Procedure: 2D Echo, Color Doppler and Cardiac Doppler  Indications:    R60.0 Lower extremity edema; I50.20* Unspecified systolic                 (congestive) heart failure   History:        Patient has prior history of Echocardiogram examinations, most                 recent 11/27/2018. CHF, Signs/Symptoms:Edema; Risk                 Factors:Hypertension, Dyslipidemia and Former Smoker.   Sonographer:    Arley Pac RDMS, RVT, RDCS Referring Phys: [id number] REGINA [facility]    Sonographer Comments: Suboptimal parasternal window. IMPRESSIONS    1. Left ventricular ejection fraction, by estimation, is 60 to 65%. The left ventricle has normal function. The left ventricle has no regional wall motion abnormalities. Left ventricular diastolic parameters are consistent with Grade I diastolic  dysfunction (impaired relaxation).  2. Right ventricular systolic function is normal. The right ventricular size is normal. There is normal pulmonary artery systolic pressure. The estimated right ventricular systolic pressure is 28.2 mmHg.  3. The mitral valve is normal in structure. No evidence of mitral valve regurgitation. No evidence of mitral stenosis.  4. The aortic valve is tricuspid. Aortic valve regurgitation is not visualized. Aortic valve sclerosis is present, with no evidence of aortic valve stenosis.  5. The inferior vena cava is normal in size with greater than 50% respiratory variability, suggesting right atrial pressure of 3 mmHg.  FINDINGS  Left Ventricle: Left ventricular ejection fraction, by estimation, is 60 to 65%. The left ventricle has normal function. The left ventricle has no regional wall motion abnormalities. The left ventricular internal cavity size was normal in size. There is  no left ventricular hypertrophy. Left ventricular diastolic parameters are consistent with Grade I diastolic dysfunction (impaired relaxation).  Right Ventricle: The right ventricular size is normal. No increase in right ventricular wall thickness. Right ventricular systolic function is normal. There is normal pulmonary artery systolic pressure. The tricuspid regurgitant velocity is 2.41 m/s, and  with an assumed right atrial pressure of 5 mmHg, the estimated right ventricular systolic pressure is 28.2 mmHg.  Left Atrium: Left atrial size was normal in size.  Right Atrium: Right atrial size was normal in size.  Pericardium: There is no evidence of pericardial effusion.  Mitral Valve: The mitral valve is normal in structure. No evidence of mitral valve  regurgitation. No evidence of mitral valve stenosis.  Tricuspid Valve: The tricuspid valve is normal  in structure. Tricuspid valve regurgitation is mild . No evidence of tricuspid stenosis.  Aortic Valve: The aortic valve is tricuspid. Aortic valve regurgitation is not visualized. Aortic valve sclerosis is present, with no evidence of aortic valve stenosis. Aortic valve mean gradient measures 4.0 mmHg. Aortic valve peak gradient measures 7.8  mmHg. Aortic valve area, by VTI measures 2.13 cm.  Pulmonic Valve: The pulmonic valve was normal in structure. Pulmonic valve regurgitation is not visualized. No evidence of pulmonic stenosis.  Aorta: The aortic root is normal in size and structure.  Venous: The inferior vena cava is normal in size with greater than 50% respiratory variability, suggesting right atrial pressure of 3 mmHg.  IAS/Shunts: No atrial level shunt detected by color flow Doppler.    LEFT VENTRICLE PLAX 2D LVOT diam:     1.90 cm     Diastology LV SV:         52          LV e' medial:    4.68 cm/s LV SV Index:   26          LV E/e' medial:  13.3 LVOT Area:     2.84 cm    LV e' lateral:   8.81 cm/s                            LV E/e' lateral: 7.1   LV Volumes (MOD) LV vol d, MOD A2C: 46.5 ml LV vol d, MOD A4C: 51.9 ml LV vol s, MOD A2C: 22.2 ml LV vol s, MOD A4C: 24.7 ml LV SV MOD A2C:     24.3 ml LV SV MOD A4C:     51.9 ml LV SV MOD BP:      28.3 ml  RIGHT VENTRICLE RV S prime:     17.50 cm/s TAPSE (M-mode): 1.9 cm  LEFT ATRIUM           Index LA Vol (A4C): 59.5 ml 29.21 ml/m  AORTIC VALVE                    PULMONIC VALVE AV Area (Vmax):    2.07 cm     PV Vmax:       0.96 m/s AV Area (Vmean):   2.02 cm     PV Peak grad:  3.6 mmHg AV Area (VTI):     2.13 cm AV Vmax:           140.00 cm/s AV Vmean:          94.500 cm/s AV VTI:            0.245 m AV Peak Grad:      7.8 mmHg AV Mean Grad:      4.0 mmHg LVOT Vmax:         102.00 cm/s LVOT Vmean:         67.400 cm/s LVOT VTI:          0.184 m LVOT/AV VTI ratio: 0.75   AORTA Ao Root diam: 3.30 cm Ao Asc diam:  3.60 cm Ao Arch diam: 2.6 cm  MITRAL VALVE               TRICUSPID VALVE MV Area (PHT): 3.99 cm    TR Peak grad:   23.2 mmHg MV Decel Time: 190 msec    TR Vmax:        241.00 cm/s MV E velocity: 62.40 cm/s MV A  velocity: 94.90 cm/s  SHUNTS MV E/A ratio:  0.66        Systemic VTI:  0.18 m                            Systemic Diam: 1.90 cm  Evalene Lunger MD Electronically signed by Evalene Lunger MD Signature Date/Time: 06/23/2022/3:31:19 PM      Final   Note: Reviewed        Physical Exam  General appearance: Well nourished, well developed, and well hydrated. In no apparent acute distress Mental status: Alert, oriented x 3 (person, place, & time)       Respiratory: No evidence of acute respiratory distress Eyes: PERLA Vitals: BP 97/69   Pulse (!) 108   Temp 98 F (36.7 C)   Resp 16   Ht 5' 8.5 (1.74 m)   Wt 184 lb (83.5 kg)   SpO2 100%   BMI 27.57 kg/m  BMI: Estimated body mass index is 27.57 kg/m as calculated from the following:   Height as of this encounter: 5' 8.5 (1.74 m).   Weight as of this encounter: 184 lb (83.5 kg). Ideal: Ideal body weight: 65 kg (143 lb 6.5 oz) Adjusted ideal body weight: 72.4 kg (159 lb 10.3 oz)  Lumbar Spine Area Exam  Skin & Axial Inspection: No masses, redness, or swelling Alignment: Symmetrical Functional ROM: Pain restricted ROM affecting primarily the left Stability: No instability detected Muscle Tone/Strength: Functionally intact. No obvious neuro-muscular anomalies detected. Sensory (Neurological): Dermatomal pain pattern Palpation: No palpable anomalies       Provocative Tests:  Lumbar quadrant test (Kemp's test): (+) bilateral for foraminal stenosis Lateral bending test: (+) ipsilateral radicular pain, bilaterally. Positive for bilateral foraminal stenosis.  Gait & Posture Assessment  Ambulation:  Unassisted Gait: Relatively normal for age and body habitus Posture: WNL  Lower Extremity Exam    Side: Right lower extremity  Side: Left lower extremity  Stability: No instability observed          Stability: No instability observed          Skin & Extremity Inspection: Skin color, temperature, and hair growth are WNL. No peripheral edema or cyanosis. No masses, redness, swelling, asymmetry, or associated skin lesions. No contractures.  Skin & Extremity Inspection: Skin color, temperature, and hair growth are WNL. No peripheral edema or cyanosis. No masses, redness, swelling, asymmetry, or associated skin lesions. No contractures.  Functional ROM: Unrestricted ROM                  Functional ROM: Pain restricted ROM for knee joint          Muscle Tone/Strength: Functionally intact. No obvious neuro-muscular anomalies detected.  Muscle Tone/Strength: Functionally intact. No obvious neuro-muscular anomalies detected.  Sensory (Neurological): Unimpaired        Sensory (Neurological): Arthropathic arthralgia        DTR: Patellar: deferred today Achilles: deferred today Plantar: deferred today  DTR: Patellar: deferred today Achilles: deferred today Plantar: deferred today  Palpation: No palpable anomalies  Palpation: No palpable anomalies    Assessment   Diagnosis Status  1. Spinal stenosis, lumbar region, with neurogenic claudication   2. Chronic radicular lumbar pain   3. Bilateral primary osteoarthritis of knee   4. Primary osteoarthritis of left knee   5. Chronic pain syndrome    Having a Flare-up Having a Flare-up Controlled    Plan of Care  Spinal Stenosis   She exhibits chronic back pain radiating down both legs, indicative of spinal stenosis, accompanied by leg weakness that worsens with standing and walking but improves when sitting, also likely contributing to her knee pain. We discussed the anatomical narrowing of the spinal canal and its impact, noting that  stenosis, common in aging, can be exacerbated by physical labor. We will order x-rays of the back, administer an epidural injection for sciatica pain and LSS/[place], recommend a back brace for support, and advise her to avoid heavy lifting and listen to her body.    Ms. JAXYN MESTAS has a current medication list which includes the following long-term medication(s): albuterol , atorvastatin , esomeprazole , famotidine , furosemide , gabapentin , levothyroxine , linaclotide , lisinopril -hydrochlorothiazide , potassium chloride  sa, rizatriptan , and sertraline .  Pharmacotherapy (Medications Ordered): Meds ordered this encounter  Medications   HYDROcodone -acetaminophen  (NORCO) 7.5-325 MG tablet    Sig: Take 1 tablet by mouth every 6 (six) hours as needed for severe pain (pain score 7-10). Must last 30 days.    Dispense:  105 tablet    Refill:  0    Chronic Pain: STOP Act (Not applicable) Fill 1 day early if closed on refill date. Avoid benzodiazepines within 8 hours of opioids   HYDROcodone -acetaminophen  (NORCO) 7.5-325 MG tablet    Sig: Take 1 tablet by mouth every 6 (six) hours as needed for severe pain (pain score 7-10). Must last 30 days.    Dispense:  105 tablet    Refill:  0    Chronic Pain: STOP Act (Not applicable) Fill 1 day early if closed on refill date. Avoid benzodiazepines within 8 hours of opioids   HYDROcodone -acetaminophen  (NORCO) 7.5-325 MG tablet    Sig: Take 1 tablet by mouth every 6 (six) hours as needed for severe pain (pain score 7-10). Must last 30 days.    Dispense:  105 tablet    Refill:  0    Chronic Pain: STOP Act (Not applicable) Fill 1 day early if closed on refill date. Avoid benzodiazepines within 8 hours of opioids   Orders:  Orders Placed This Encounter  Procedures   Lumbar Epidural Injection    Standing Status:   Future    Expected Date:   05/08/2023    Expiration Date:   07/17/2023    Scheduling Instructions:     Procedure: Interlaminar Lumbar Epidural Steroid  injection (LESI)            Laterality: LEFT     Sedation: without     Timeframe: ASAA    Where will this procedure be performed?:   [facility]   DG Lumbar Spine Complete W/Bend    Patient presents with axial pain with possible radicular component. Please assist us  in identifying specific level(s) and laterality of any additional findings such as: 1. Facet (Zygapophyseal) joint DJD (Hypertrophy, space narrowing, subchondral sclerosis, and/or osteophyte formation) 2. DDD and/or IVDD (Loss of disc height, desiccation, gas patterns, osteophytes, endplate sclerosis, or Black disc disease) 3. Pars defects 4. Spondylolisthesis, spondylosis, and/or spondyloarthropathies (include Degree/Grade of displacement in mm) (stability) 5. Vertebral body Fractures (acute/chronic) (state percentage of collapse) 6. Demineralization (osteopenia/osteoporotic) 7. Bone pathology 8. Foraminal narrowing  9. Surgical changes    Standing Status:   Future    Expiration Date:   05/19/2023    Scheduling Instructions:     Please make sure that the patient understands that this needs to be done as soon as possible. Never have the patient do the imaging just before the next  appointment. Inform patient that having the imaging done within the [facility] will expedite the availability of the results and will provide      imaging availability to the requesting physician. In addition inform the patient that the imaging order has an expiration date and will not be renewed if not done within the active period.    Reason for Exam (SYMPTOM  OR DIAGNOSIS REQUIRED):   Low back pain    Preferred imaging location?:   [facility]    Call Results- Best Contact Number?:   [phone] [facility])    Radiology Contrast Protocol - do NOT remove file path:   [email or web address]    Release to patient:   Immediate   Follow-up plan:   Return in about 20 days (around 05/08/2023)  for Left L4/5 IL ESI, in clinic NS.      Status post left genicular RFA 04/16/2018, status post right genicular RFA 03/12/2018.  Provided her with benefit.  Status post left knee intra-articular steroid injection #3 on 11/21/2018.   hyalgan left knee #1 on 04/17/19, #2 05/22/19, #3 07/03/2019, #4 08/28/19, #5 on 09/25/19               Recent Visits Date Type Provider Dept  02/15/23 Procedure visit Marcelino Nurse, MD [facility]  Showing recent visits within past 90 days and meeting all other requirements Today's Visits Date Type Provider Dept  04/18/23 Office Visit Marcelino Nurse, MD [facility]  Showing today's visits and meeting all other requirements Future Appointments Date Type Provider Dept  07/13/23 Appointment Marcelino Nurse, MD [facility]  Showing future appointments within next 90 days and meeting all other requirements  I discussed the assessment and treatment plan with the patient. The patient was provided an opportunity to ask questions and all were answered. The patient agreed with the plan and demonstrated an understanding of the instructions.  Patient advised to call back or seek an in-person evaluation if the symptoms or condition worsens.  Duration of encounter: .  Total time on encounter, as per AMA guidelines included both the face-to-face and non-face-to-face time personally spent by the physician and/or other qualified health care professional(s) on the day of the encounter (includes time in activities that require the physician or other qualified health care professional and does not include time in activities normally performed by clinical staff). Physician's time may include the following activities when performed: Preparing to see the patient (e.g., pre-charting review of records, searching for previously ordered imaging, lab work, and nerve conduction tests) Review of prior analgesic pharmacotherapies. Reviewing PMP Interpreting  ordered tests (e.g., lab work, imaging, nerve conduction tests) Performing post-procedure evaluations, including interpretation of diagnostic procedures Obtaining and/or reviewing separately obtained history Performing a medically appropriate examination and/or evaluation Counseling and educating the patient/family/caregiver Ordering medications, tests, or procedures Referring and communicating with other health care professionals (when not separately reported) Documenting clinical information in the electronic or other health record Independently interpreting results (not separately reported) and communicating results to the patient/ family/caregiver Care coordination (not separately reported)  Note by: Nurse Marcelino, MD Date: 04/18/2023; Time: 3:38 PM

## 2023-04-18 NOTE — Patient Instructions (Signed)
 ______________________________________________________________________    Preparing for your procedure  Appointments: If you think you may not be able to keep your appointment, call 24-48 hours in advance to cancel. We need time to make it available to others.  Procedure visits are for procedures only. During your procedure appointment there will be: NO Prescription Refills*. NO medication changes or discussions*. NO discussion of disability issues*. NO unrelated pain problem evaluations*. NO evaluations to order other pain procedures*. *These will be addressed at a separate and distinct evaluation encounter on the provider's evaluation schedule and not during procedure days.  Instructions: Food intake: Avoid eating anything solid for at least 8 hours prior to your procedure. Clear liquid intake: You may take clear liquids such as water up to 2 hours prior to your procedure. (No carbonated drinks. No soda.) Transportation: Unless otherwise stated by your physician, bring a driver. (Driver cannot be a Market Researcher, Pharmacist, Community, or any other form of public transportation.) Morning Medicines: Except for blood thinners, take all of your other morning medications with a sip of water. Make sure to take your heart and blood pressure medicines. If your blood pressure's lower number is above 100, the case will be rescheduled. Blood thinners: Make sure to stop your blood thinners as instructed.  If you take a blood thinner, but were not instructed to stop it, call our office [phone] and ask to talk to a nurse. Not stopping a blood thinner prior to certain procedures could lead to serious complications. Diabetics on insulin: Notify the staff so that you can be scheduled 1st case in the morning. If your diabetes requires high dose insulin, take only  of your normal insulin dose the morning of the procedure and notify the staff that you have done so. Preventing infections: Shower with an antibacterial soap the  morning of your procedure.  Build-up your immune system: Take 1000 mg of Vitamin C with every meal (3 times a day) the day prior to your procedure. Antibiotics: Inform the nursing staff if you are taking any antibiotics or if you have any conditions that may require antibiotics prior to procedures. (Example: recent joint implants)   Pregnancy: If you are pregnant make sure to notify the nursing staff. Not doing so may result in injury to the fetus, including death.  Sickness: If you have a cold, fever, or any active infections, call and cancel or reschedule your procedure. Receiving steroids while having an infection may result in complications. Arrival: You must be in the facility at least 30 minutes prior to your scheduled procedure. Tardiness: Your scheduled time is also the cutoff time. If you do not arrive at least 15 minutes prior to your procedure, you will be rescheduled.  Children: Do not bring any children with you. Make arrangements to keep them home. Dress appropriately: There is always a possibility that your clothing may get soiled. Avoid long dresses. Valuables: Do not bring any jewelry or valuables.  Reasons to call and reschedule or cancel your procedure: (Following these recommendations will minimize the risk of a serious complication.) Surgeries: Avoid having procedures within 2 weeks of any surgery. (Avoid for 2 weeks before or after any surgery). Flu Shots: Avoid having procedures within 2 weeks of a flu shots or . (Avoid for 2 weeks before or after immunizations). Barium: Avoid having a procedure within 7-10 days after having had a radiological study involving the use of radiological contrast. (Myelograms, Barium swallow or enema study). Heart attacks: Avoid any elective procedures or surgeries for the  initial 6 months after a Myocardial Infarction (Heart Attack). Blood thinners: It is imperative that you stop these medications before procedures. Let us  know if you if you take  any blood thinner.  Infection: Avoid procedures during or within two weeks of an infection (including chest colds or gastrointestinal problems). Symptoms associated with infections include: Localized redness, fever, chills, night sweats or profuse sweating, burning sensation when voiding, cough, congestion, stuffiness, runny nose, sore throat, diarrhea, nausea, vomiting, cold or Flu symptoms, recent or current infections. It is specially important if the infection is over the area that we intend to treat. Heart and lung problems: Symptoms that may suggest an active cardiopulmonary problem include: cough, chest pain, breathing difficulties or shortness of breath, dizziness, ankle swelling, uncontrolled high or unusually low blood pressure, and/or palpitations. If you are experiencing any of these symptoms, cancel your procedure and contact your primary care physician for an evaluation.  Remember:  Regular Business hours are:  Monday to Thursday 8:00 AM to 4:00 PM  Provider's Schedule: Eric Como, MD:  Procedure days: Tuesday and Thursday 7:30 AM to 4:00 PM  Wallie Sherry, MD:  Procedure days: Monday and Wednesday 7:30 AM to 4:00 PM Last  Updated: 03/21/2023 ______________________________________________________________________    Epidural Steroid Injection  An epidural steroid injection is a shot of steroid medicine, also called cortisone, and a numbing medicine that is given into the epidural space. This space is between the spinal cord and the bones of the back. This shot helps relieve pain caused by an irritated or swollen nerve root. The pain relief you get from the injection depends on the cause of your condition and how long your pain lasts. You may have a period of slightly more pain after your injection, before the steroid medicine takes effect. This medicine usually starts working within 1-3 days. In some cases, you might need 7-10 days to feel the full effect. Tell your health  care provider about: Any allergies you have. All medicines you are taking, including vitamins, herbs, eye drops, creams, and over-the-counter medicines. Any problems you or family members have had with anesthesia. Any bleeding problems you have. Any surgeries you have had. Any medical conditions you have. Whether you are pregnant or may be pregnant. What are the risks? Your health care provider will talk with you about risks. These may include: Headache. Bleeding. Infection. Allergic reaction to medicines or dyes. Nerve damage. Not being able to move (paralysis). This is rare. What happens before the procedure? Medicines You may be given medicines to lower anxiety. Ask your provider about: Changing or stopping your regular medicines. These include any diabetes medicines or blood thinners you take. Taking medicines such as aspirin  and ibuprofen. These medicines can thin your blood. Do not take them unless your provider tells you to. Taking over-the-counter medicines, vitamins, herbs, and supplements. General instructions Follow instructions from your provider about what you may eat and drink. Ask your provider what steps will be taken to help prevent infection. If you will be going home right after the procedure, plan to have a responsible adult: Take you home from the hospital or clinic. You will not be allowed to drive. Care for you for the time you are told. What happens during the procedure?  An IV will be inserted into one of your veins. You may be given a sedative to help you relax. You will be asked to sit or lie on your side. The injection site will be cleaned. An X-ray machine will be  used to guide the needle close to the nerve that is causing pain. A needle will be put through your skin into the epidural space. This may cause you some discomfort. Contrast dye may be injected at the site to make sure that the steroid medicine will be sent to the exact place it needs to  go. The steroid medicine and a numbing medicine will be injected into the epidural space for pain relief. The needle will be removed. A bandage (dressing) will be put over the injection site. The procedure may vary among providers and hospitals. What happens after the procedure? Your blood pressure, heart rate, breathing rate, and blood oxygen level will be monitored until you leave the hospital or clinic. Your IV will be removed. Your arm or leg may feel weak or numb for a few hours. This information is not intended to replace advice given to you by your health care provider. Make sure you discuss any questions you have with your health care provider. Document Revised: 11/05/2021 Document Reviewed: 11/05/2021 Elsevier Patient Education  2024 [employer].

## 2023-04-26 ENCOUNTER — Encounter: Payer: Self-pay | Admitting: *Deleted

## 2023-05-02 ENCOUNTER — Ambulatory Visit: Payer: Medicare HMO | Admitting: Internal Medicine

## 2023-05-09 ENCOUNTER — Encounter: Payer: Self-pay | Admitting: Internal Medicine

## 2023-05-09 ENCOUNTER — Ambulatory Visit (INDEPENDENT_AMBULATORY_CARE_PROVIDER_SITE_OTHER): Payer: Medicare HMO | Admitting: Internal Medicine

## 2023-05-09 VITALS — BP 94/68 | Ht 68.5 in | Wt 177.0 lb

## 2023-05-09 DIAGNOSIS — M170 Bilateral primary osteoarthritis of knee: Secondary | ICD-10-CM

## 2023-05-09 DIAGNOSIS — T402X5A Adverse effect of other opioids, initial encounter: Secondary | ICD-10-CM

## 2023-05-09 DIAGNOSIS — F5104 Psychophysiologic insomnia: Secondary | ICD-10-CM

## 2023-05-09 DIAGNOSIS — G43909 Migraine, unspecified, not intractable, without status migrainosus: Secondary | ICD-10-CM | POA: Diagnosis not present

## 2023-05-09 DIAGNOSIS — F32A Depression, unspecified: Secondary | ICD-10-CM

## 2023-05-09 DIAGNOSIS — I10 Essential (primary) hypertension: Secondary | ICD-10-CM | POA: Diagnosis not present

## 2023-05-09 DIAGNOSIS — E785 Hyperlipidemia, unspecified: Secondary | ICD-10-CM

## 2023-05-09 DIAGNOSIS — R739 Hyperglycemia, unspecified: Secondary | ICD-10-CM

## 2023-05-09 DIAGNOSIS — D692 Other nonthrombocytopenic purpura: Secondary | ICD-10-CM

## 2023-05-09 DIAGNOSIS — J410 Simple chronic bronchitis: Secondary | ICD-10-CM | POA: Diagnosis not present

## 2023-05-09 DIAGNOSIS — F419 Anxiety disorder, unspecified: Secondary | ICD-10-CM

## 2023-05-09 DIAGNOSIS — K2100 Gastro-esophageal reflux disease with esophagitis, without bleeding: Secondary | ICD-10-CM | POA: Diagnosis not present

## 2023-05-09 DIAGNOSIS — Z6826 Body mass index (BMI) 26.0-26.9, adult: Secondary | ICD-10-CM

## 2023-05-09 DIAGNOSIS — Z23 Encounter for immunization: Secondary | ICD-10-CM

## 2023-05-09 DIAGNOSIS — G894 Chronic pain syndrome: Secondary | ICD-10-CM

## 2023-05-09 DIAGNOSIS — N1831 Chronic kidney disease, stage 3a: Secondary | ICD-10-CM

## 2023-05-09 DIAGNOSIS — E890 Postprocedural hypothyroidism: Secondary | ICD-10-CM | POA: Diagnosis not present

## 2023-05-09 DIAGNOSIS — E663 Overweight: Secondary | ICD-10-CM

## 2023-05-09 DIAGNOSIS — K5903 Drug induced constipation: Secondary | ICD-10-CM

## 2023-05-09 DIAGNOSIS — D631 Anemia in chronic kidney disease: Secondary | ICD-10-CM

## 2023-05-09 DIAGNOSIS — M810 Age-related osteoporosis without current pathological fracture: Secondary | ICD-10-CM

## 2023-05-09 MED ORDER — LISINOPRIL-HYDROCHLOROTHIAZIDE 10-12.5 MG PO TABS: 1.0000 | ORAL_TABLET | Freq: Every day | ORAL | 1 refills | Status: DC

## 2023-05-09 NOTE — Assessment & Plan Note (Signed)
Encouraged vitamin D and calcium OTC Encouraged daily weightbearing exercise

## 2023-05-09 NOTE — Assessment & Plan Note (Signed)
Encouraged high fiber diet and adequate water intake Continue linzess

## 2023-05-09 NOTE — Assessment & Plan Note (Signed)
TSH and free T4 today We will adjust levothyroxine if needed based on labs

## 2023-05-09 NOTE — Assessment & Plan Note (Signed)
CBC today.

## 2023-05-09 NOTE — Assessment & Plan Note (Signed)
Try to reduce stress Continue Maxalt as needed

## 2023-05-09 NOTE — Assessment & Plan Note (Signed)
C-Met and lipid profile today Encouraged her to consume low-fat diet

## 2023-05-09 NOTE — Assessment & Plan Note (Signed)
Continue gabapentin.

## 2023-05-09 NOTE — Assessment & Plan Note (Signed)
Encouraged diet and exercise for weight loss ?

## 2023-05-09 NOTE — Patient Instructions (Signed)

## 2023-05-09 NOTE — Assessment & Plan Note (Signed)
CMET today Continue lisinopril for renal protection

## 2023-05-09 NOTE — Assessment & Plan Note (Signed)
Will have her start cutting lisinopril HCT in half Reinforced diet and exercise weight loss C-Met today

## 2023-05-09 NOTE — Assessment & Plan Note (Signed)
Not currently taking sertraline Support offered

## 2023-05-09 NOTE — Assessment & Plan Note (Signed)
Encourage weight loss as this can help reduce joint pain Continue hydrocodone and gabapentin for pain management

## 2023-05-09 NOTE — Assessment & Plan Note (Signed)
Continue gabapentin and hydrocodone for pain management

## 2023-05-09 NOTE — Assessment & Plan Note (Signed)
Continue albuterol as needed

## 2023-05-09 NOTE — Progress Notes (Signed)
Subjective:    Patient ID: Pamela Romero, female    DOB: [birth year], 73 y.o.   MRN: [id number]  HPI  Patient presents to clinic today for follow-up of chronic conditions.  HTN: Her BP today is 94/68.  She is taking lisinopril HCT as prescribed.  ECG from 05/2010 reviewed.  CKD 3: Her last creatinine was 1.24, GFR 47, 11/2022.  She is on lisinopril for renal protection.  She does not follow with nephrology.  GERD: Triggered by NSAID use.  She denies has some breakthrough on esomeprazole and famotidine.  Upper GI from 10/2022 reviewed.  COPD: She denies chronic cough but she does have intermittent shortness of breath.  She uses albuterol as needed with good relief of symptoms.  There is no PFTs on file.  She does not follow with pulmonary.  Migraines: Triggered by stress.  These occur 3 times per month.  She takes maxalt as needed with good relief of symptoms.  She does not follow with neurology.  Anxiety and depression: Chronic, but she is no longer taking sertraline.  She is not seeing a therapist.  She denies SI/HI.  Chronic constipation: Managed with linzess. There is no recent colonoscopy on file.   Chronic pain syndrome/OA: Generalized.  Managed with hydrocodone and gabapentin.  She follows with pain management.  Hypothyroidism: She denies any issues on her current dose of levothyroxine.  She does not follow with endocrinology.  HLD: Her last LDL was 61, triglycerides 409, 10/2022 .  She is not taking any cholesterol-lowering medication at this time.  She tries to consume a low-fat diet.  History of endometrial cancer: Status post hysterectomy and radiation.  She no longer follows with gynecology oncology.  Insomnia: She has difficulty staying asleep.  She takes gabapentin with some relief of symptoms.  Sleep study from 12/2019 reviewed.  Osteoporosis: She is not taking any medication for this at this time.  She does try to get in some weightbearing exercise.  Anemia: Her last  H/H was 10.3/35.5, 10/2022. She is not taking any oral iron at this time. She does not follow with hematology.  Review of Systems     Past Medical History:  Diagnosis Date   Abnormal EKG    CAD (coronary artery disease)    non critical, by cardiac cath 2006   Cancer [facility])    endometrial Ca   COPD (chronic obstructive pulmonary disease) (HCC)    Endometrial adenocarcinoma (HCC) 2011   Hyperlipidemia    Hypertension    Osteoarthritis    Right foot, Left knee (Dr. Joice Lofts)   Reflux gastritis    Thyroid disease    tobacco abuse    Vaginal delivery    x 2    Current Outpatient Medications  Medication Sig Dispense Refill   albuterol (PROAIR HFA) 108 (90 Base) MCG/ACT inhaler Inhale 1-2 puffs into the lungs every 6 (six) hours as needed for wheezing or shortness of breath. 6.7 g 1   aspirin EC 81 MG tablet Take 1 tablet (81 mg total) by mouth daily. Swallow whole. 30 tablet 12   atorvastatin (LIPITOR) 10 MG tablet TAKE 1 TABLET BY MOUTH ONCE DAILY 90 tablet 1   docusate sodium (COLACE) 100 MG capsule Take 1 capsule (100 mg total) by mouth at bedtime as needed for mild constipation. 90 capsule 0   esomeprazole (NEXIUM) 40 MG capsule Take 1 capsule (40 mg total) by mouth 2 (two) times daily before a meal. 180 capsule 0   famotidine (PEPCID)  20 MG tablet Take 1 tablet (20 mg total) by mouth at bedtime. 90 tablet 1   furosemide (LASIX) 20 MG tablet Take 1 tablet (20 mg total) by mouth daily. 5 tablet 0   gabapentin (NEURONTIN) 300 MG capsule Take 1-2 capsules (300-600 mg total) by mouth at bedtime. 180 capsule 2   HYDROcodone-acetaminophen (NORCO) 7.5-325 MG tablet Take 1 tablet by mouth every 6 (six) hours as needed for severe pain (pain score 7-10). Must last 30 days. 105 tablet 0   [START ON 05/18/2023] HYDROcodone-acetaminophen (NORCO) 7.5-325 MG tablet Take 1 tablet by mouth every 6 (six) hours as needed for severe pain (pain score 7-10). Must last 30 days. 105 tablet 0   [START ON  06/17/2023] HYDROcodone-acetaminophen (NORCO) 7.5-325 MG tablet Take 1 tablet by mouth every 6 (six) hours as needed for severe pain (pain score 7-10). Must last 30 days. 105 tablet 0   levothyroxine (SYNTHROID) 112 MCG tablet Take 1 tablet (112 mcg total) by mouth daily. 90 tablet 3   linaclotide (LINZESS) 145 MCG CAPS capsule Take 1 capsule (145 mcg total) by mouth daily before breakfast. As needed. 30 capsule 2   lisinopril-hydrochlorothiazide (ZESTORETIC) 20-12.5 MG tablet TAKE 1 TABLET BY MOUTH ONCE DAILY 90 tablet 1   naloxegol oxalate (MOVANTIK) 12.5 MG TABS tablet Take 1 tablet (12.5 mg total) by mouth daily. 90 tablet 0   ondansetron (ZOFRAN-ODT) 4 MG disintegrating tablet Take 1 tablet (4 mg total) by mouth daily as needed for nausea or vomiting. 30 tablet 0   polyethylene glycol powder (GLYCOLAX/MIRALAX) 17 GM/SCOOP powder Take 17 g by mouth daily. 3350 g 1   potassium chloride SA (KLOR-CON M) 20 MEQ tablet Take 1 tablet (20 mEq total) by mouth daily. 5 tablet 0   rizatriptan (MAXALT-MLT) 10 MG disintegrating tablet Take 1 tablet (10 mg total) by mouth as needed for migraine. May repeat in 2 hours if needed 10 tablet 2   sertraline (ZOLOFT) 50 MG tablet Take 1/2 tab PO x 10 days then increase to 1 tab daily thereafter 90 tablet 1   No current facility-administered medications for this visit.    No Known Allergies  Family History  Problem Relation Age of Onset   Alcohol abuse Father    Cancer Maternal Aunt        stomach   Alcohol abuse Maternal Aunt    Alcohol abuse Mother    Heart disease Brother        MI age 49- non smoker   Cancer Brother        Pancreatic   Cancer Brother        colon, dx'd age 71   Cancer Brother        stomach, paternal half brother   Breast cancer Neg Hx     Social History   Socioeconomic History   Marital status: Married    Spouse name: Not on file   Number of children: 0   Years of education: Not on file   Highest education level: Not on  file  Occupational History   Occupation: retired   Tobacco Use   Smoking status: Former    Current packs/day: 0.00    Average packs/day: 0.5 packs/day for 50.0 years (25.0 ttl pk-yrs)    Types: Cigarettes    Start date: 05/11/1968    Quit date: 05/11/2018    Years since quitting: 4.9   Smokeless tobacco: Former  Building services engineer status: Never Used  Substance and Sexual  Activity   Alcohol use: No   Drug use: No   Sexual activity: Not on file  Other Topics Concern   Not on file  Social History Narrative   Lives with spouse, takes care of her mother.   Had 2 children, one died at birth, the other given up for adoption (was unmarried).   Social Drivers of Corporate investment banker Strain: Low Risk  (04/29/2022)   Overall Financial Resource Strain (CARDIA)    Difficulty of Paying Living Expenses: Not very hard  Food Insecurity: No Food Insecurity (04/29/2022)   Hunger Vital Sign    Worried About Running Out of Food in the Last Year: Never true    Ran Out of Food in the Last Year: Never true  Transportation Needs: No Transportation Needs (04/29/2022)   PRAPARE - Administrator, Civil Service (Medical): No    Lack of Transportation (Non-Medical): No  Physical Activity: Insufficiently Active (04/29/2022)   Exercise Vital Sign    Days of Exercise per Week: 3 days    Minutes of Exercise per Session: 30 min  Stress: No Stress Concern Present (04/29/2022)   [employer] of Occupational Health - Occupational Stress Questionnaire    Feeling of Stress : Only a little  Social Connections: Moderately Integrated (04/29/2022)   Social Connection and Isolation Panel [NHANES]    Frequency of Communication with Friends and Family: Three times a week    Frequency of Social Gatherings with Friends and Family: Twice a week    Attends Religious Services: More than 4 times per year    Active Member of [employer] or Organizations: No    Attends Banker Meetings: Never     Marital Status: Married  Catering manager Violence: Not At Risk (04/29/2022)   Humiliation, Afraid, Rape, and Kick questionnaire    Fear of Current or Ex-Partner: No    Emotionally Abused: No    Physically Abused: No    Sexually Abused: No     Constitutional: Patient reports headaches.  Denies fever, malaise, fatigue, or abrupt weight changes.  HEENT: Denies eye pain, eye redness, ear pain, ringing in the ears, wax buildup, runny nose, nasal congestion, bloody nose, or sore throat. Respiratory: Denies difficulty breathing, shortness of breath, cough or sputum production.   Cardiovascular: Denies chest pain, chest tightness, palpitations or swelling in the hands or feet.  Gastrointestinal: Patient reports constipation.  Denies abdominal pain, bloating, diarrhea or blood in the stool.  GU: Denies urgency, frequency, pain with urination, burning sensation, blood in urine, odor or discharge. Musculoskeletal: Patient reports chronic joint muscle pain.  Denies decrease in range of motion, difficulty with gait, or joint swelling.  Skin: Denies redness, rashes, lesions or ulcercations.  Neurological: Patient reports insomnia.  Denies dizziness, difficulty with memory, difficulty with speech or problems with balance and coordination.  Psych: Patient has a history of anxiety and depression.  Denies SI/HI.  No other specific complaints in a complete review of systems (except as listed in HPI above).  Objective:   Physical Exam  BP 94/68 (BP Location: Left Arm, Patient Position: Sitting, Cuff Size: Normal)   Ht 5' 8.5" (1.74 m)   Wt 177 lb (80.3 kg)   BMI 26.52 kg/m    Wt Readings from Last 3 Encounters:  04/18/23 184 lb (83.5 kg)  02/15/23 190 lb (86.2 kg)  01/12/23 190 lb (86.2 kg)    General: Appears her stated age, obese in NAD. Skin: Warm, dry and intact.  Senile purpura noted. HEENT: Head: normal shape and size; Eyes: sclera white, no icterus, conjunctiva pink, PERRLA and EOMs  intact;  Neck:  Neck supple, trachea midline. No masses, lumps or thyromegaly present.  Cardiovascular: Normal rate and rhythm. S1,S2 noted.  No murmur, rubs or gallops noted. No JVD or BLE edema. No carotid bruits noted. Pulmonary/Chest: Normal effort and positive vesicular breath sounds. No respiratory distress. No wheezes, rales or ronchi noted.  Abdomen: Soft and nontender. Normal bowel sounds.  Musculoskeletal: No difficulty with gait.  Neurological: Alert and oriented. Cranial nerves II-XII grossly intact. Coordination normal.  Psychiatric: Mood and affect normal. Behavior is normal. Judgment and thought content normal.     BMET    Component Value Date/Time   NA 140 11/28/2022 1145   NA 142 09/01/2022 1017   K 4.0 11/28/2022 1145   CL 103 11/28/2022 1145   CO2 30 11/28/2022 1145   GLUCOSE 76 11/28/2022 1145   BUN 23 11/28/2022 1145   BUN 22 09/01/2022 1017   CREATININE 1.24 (H) 11/28/2022 1145   CALCIUM 9.3 11/28/2022 1145   GFRNONAA >60 04/07/2020 1022   GFRNONAA 52 (L) 01/02/2020 1453   GFRAA 61 01/02/2020 1453    Lipid Panel     Component Value Date/Time   CHOL 125 10/28/2022 1137   TRIG 124 10/28/2022 1137   HDL 43 (L) 10/28/2022 1137   CHOLHDL 2.9 10/28/2022 1137   VLDL 42.0 (H) 05/14/2014 0918   LDLCALC 61 10/28/2022 1137    CBC    Component Value Date/Time   WBC 6.6 10/28/2022 1137   RBC 3.47 (L) 10/28/2022 1137   HGB 10.6 (L) 10/28/2022 1137   HGB 10.1 (L) 09/01/2022 1017   HCT 33.5 (L) 10/28/2022 1137   HCT 31.4 (L) 09/01/2022 1017   PLT 180 10/28/2022 1137   MCV 96.5 10/28/2022 1137   MCV 94 09/01/2022 1017   MCH 30.5 10/28/2022 1137   MCHC 31.6 (L) 10/28/2022 1137   RDW 13.1 10/28/2022 1137   RDW 13.3 09/01/2022 1017   LYMPHSABS 2,125 10/28/2022 1137   LYMPHSABS 1.7 09/01/2022 1017   MONOABS 0.4 09/16/2013 1046   EOSABS 59 10/28/2022 1137   EOSABS 0.1 09/01/2022 1017   BASOSABS 40 10/28/2022 1137   BASOSABS 0.1 09/01/2022 1017    Hgb  A1C Lab Results  Component Value Date   HGBA1C 4.9 10/05/2021           Assessment & Plan:     RTC in 6 months for annual exam. Nicki Reaper, NP

## 2023-05-09 NOTE — Assessment & Plan Note (Signed)
Try to avoid foods that trigger reflux Encourage weight loss as this can produce reflux symptoms Continue as omeprazole and famotidine

## 2023-05-10 LAB — HEMOGLOBIN A1C
Hgb A1c MFr Bld: 5.1 %{Hb} (ref <5.7)
Mean Plasma Glucose: 100 mg/dL
eAG (mmol/L): 5.5 mmol/L

## 2023-05-10 LAB — COMPLETE METABOLIC PANEL WITH GFR
AG Ratio: 1.6 (calc) (ref 1.0–2.5)
ALT: 6 U/L (ref 6–29)
AST: 11 U/L (ref 10–35)
Albumin: 3.9 g/dL (ref 3.6–5.1)
Alkaline phosphatase (APISO): 73 U/L (ref 37–153)
BUN/Creatinine Ratio: 17 (calc) (ref 6–22)
BUN: 20 mg/dL (ref 7–25)
CO2: 28 mmol/L (ref 20–32)
Calcium: 10.3 mg/dL (ref 8.6–10.4)
Chloride: 102 mmol/L (ref 98–110)
Creat: 1.17 mg/dL — ABNORMAL HIGH (ref 0.60–1.00)
Globulin: 2.5 g/dL (ref 1.9–3.7)
Glucose, Bld: 69 mg/dL (ref 65–139)
Potassium: 4 mmol/L (ref 3.5–5.3)
Sodium: 140 mmol/L (ref 135–146)
Total Bilirubin: 0.4 mg/dL (ref 0.2–1.2)
Total Protein: 6.4 g/dL (ref 6.1–8.1)
eGFR: 50 mL/min/{1.73_m2} — ABNORMAL LOW (ref >=60)

## 2023-05-10 LAB — LIPID PANEL
Cholesterol: 189 mg/dL (ref <200)
HDL: 57 mg/dL (ref >=50)
LDL Cholesterol (Calc): 110 mg/dL — ABNORMAL HIGH
Non-HDL Cholesterol (Calc): 132 mg/dL — ABNORMAL HIGH (ref <130)
Total CHOL/HDL Ratio: 3.3 (calc) (ref <5.0)
Triglycerides: 113 mg/dL (ref <150)

## 2023-05-10 LAB — CBC
HCT: 34.8 % — ABNORMAL LOW (ref 35.0–45.0)
Hemoglobin: 10.8 g/dL — ABNORMAL LOW (ref 11.7–15.5)
MCH: 29.7 pg (ref 27.0–33.0)
MCHC: 31 g/dL — ABNORMAL LOW (ref 32.0–36.0)
MCV: 95.6 fL (ref 80.0–100.0)
MPV: 10.5 fL (ref 7.5–12.5)
Platelets: 194 10*3/uL (ref 140–400)
RBC: 3.64 10*6/uL — ABNORMAL LOW (ref 3.80–5.10)
RDW: 13.7 % (ref 11.0–15.0)
WBC: 7.1 10*3/uL (ref 3.8–10.8)

## 2023-05-10 LAB — TSH: TSH: 35.64 m[IU]/L — ABNORMAL HIGH (ref 0.40–4.50)

## 2023-05-10 LAB — T4, FREE: Free T4: 0.7 ng/dL — ABNORMAL LOW (ref 0.8–1.8)

## 2023-05-26 ENCOUNTER — Ambulatory Visit: Payer: Self-pay | Admitting: *Deleted

## 2023-05-26 ENCOUNTER — Other Ambulatory Visit: Payer: Self-pay

## 2023-05-26 DIAGNOSIS — E890 Postprocedural hypothyroidism: Secondary | ICD-10-CM

## 2023-05-26 MED ORDER — LEVOTHYROXINE SODIUM 125 MCG PO TABS: 125.0000 ug | ORAL_TABLET | Freq: Every day | ORAL | 3 refills | Status: DC

## 2023-05-26 NOTE — Telephone Encounter (Signed)
Contains abnormal data T4, free Order: [id number]  Status: Final result     Next appt: 07/13/2023 at 01:40 PM in Pain Medicine Edward Jolly, MD)     Dx: Postoperative hypothyroidism   Test Result Released: No (inaccessible in MyChart)   4 Result Notes     View Follow-Up Encounter          Component Ref Range & Units (hover) 2 wk ago (05/09/23) 1 yr ago (04/08/22) 1 yr ago (12/09/21) 1 yr ago (08/06/21) 2 yr ago (03/16/21) 4 yr ago (11/06/18) 4 yr ago (06/04/18)  Free T4 0.7 Low  2.3 High  0.3 Low  0.5 Low  1.2 0.2 Low  0.7 Low   Resulting Agency [facility] [place] [facility] [place] [facility] [place] [facility] [place] [facility] [place] [facility]         Resulting Agency's Comment  Performing Organization Information:     Site IDGweneth Fritter (CLIA: [id number])     Name: Tawni Millers     Address: [address] [place], [place] [address]     Director: Marica Otter MD  Specimen Collected: 05/09/23 14:59 Last Resulted: 05/10/23 00:54      Lab Flowsheet       Order Details       View Encounter       Lab and Collection Details       Routing       Result History     View All Conversations on this Encounter    Result Care Coordination   Result Notes   Verline Lema, CMA 05/12/2023  3:13 PM EST Back to Top     I am going to mail results to address on file for patient.   Verline Lema, CMA 05/12/2023  3:10 PM EST      Attempted to reach patient again. Phone rings, no answer, no voicemail. Okay for PEC to advise if patient calls back.   Verline Lema, CMA 05/10/2023  8:50 AM EST      Attempted to reach patient to advise the above per [facility]. Patient did not answer and the phone continued to ring without connecting to voicemail. PEC, okay to advise if patient calls back. Thanks!   Lorre Munroe, NP 05/10/2023  7:35 AM EST      You remain anemic.  You may want to consider oral iron 325 mg 3  times weekly.  Kidney function remains slightly decreased but stable.  Liver function electrolytes are normal.  TSH is elevated.  We need to increase your levothyroxine to 125 mcg daily.  Let me know if she is agreeable and I will send this in how would you feel about seeing endocrinology for further evaluation.  We have had a very hard time getting your TSH within the normal range.  LDL cholesterol has worsened.  Try to consume a low saturated fat diet.  Triglycerides are within normal limits.  She does not have diabetes.   Patient Communication   Add Comments   Not seen Back to Top     Encounters Related to Results  05/12/2023 Letter (Out) 05/09/2023 Office Visit (Ordering Encounter) Back to Top  Other Results from 05/09/2023   Contains abnormal data CBC Order: [id number]  Status: Final result      Next appt: 07/13/2023 at 01:40 PM in Pain Medicine Edward Jolly, MD)      Dx: Hyperlipidemia LDL goal <100      Test Result Released: No (inaccessible  in MyChart)    4 Result Notes      View Follow-Up Encounter            Component Ref Range & Units (hover) 2 wk ago (05/09/23) 7 mo ago (10/28/22) 8 mo ago (09/01/22) 1 yr ago (04/08/22) 1 yr ago (12/09/21) 1 yr ago (10/05/21) 1 yr ago (08/06/21)  WBC 7.1 6.6 6.8 R 5.4 6.3 5.5 6.5  RBC 3.64 Low  3.47 Low  3.35 Low  R 3.17 Low  3.87 3.40 Low  3.70 Low   Hemoglobin 10.8 Low  10.6 Low  10.1 Low  R 10.3 Low  12.8 11.1 Low  12.5  HCT 34.8 Low  33.5 Low  31.4 Low  R 30.7 Low  38.2 34.1 Low  37.2  MCV 95.6 96.5 94 R 96.8 98.7 100.3 High  100.5 High   MCH 29.7 30.5 30.1 R 32.5 33.1 High  32.6 33.8 High   MCHC 31.0 Low  31.6 Low  32.2 R 33.6 33.5 32.6 33.6  Comment: For adults, a slight decrease in the calculated MCHC value (in the range of 30 to 32 g/dL) is most likely not clinically significant; however, it should be interpreted with caution in correlation with other red cell parameters and the patient's clinical condition.  RDW 13.7  13.1 13.3 R 11.8 13.2 11.8 12.6  Platelets 194 180  134 Low  139 Low  147 159  MPV 10.5 10.4  10.2 10.5 10.1 10.3  Neutro Abs  3,934 R 4.4 R   3,685 R 4,336 R  Basophils Relative  0.6 R    0.7 R 0.5 R  Lymphs   25 R      Monocytes   8 R      Eos   1 R      Basos   1 R      Monocytes Absolute   0.5 R      EOS (ABSOLUTE)   0.1 R      Immature Granulocytes   0 R      Immature Grans (Abs)   0.0 R      Lymphs Abs  2,125 R 1.7 R   1,331 R 1,671 R  Absolute Monocytes  442 R    385 R 390 R  Eosinophils Absolute  59 R    61 R 72 R  Basophils Absolute  40 R 0.1 R   39 R 33 R  Neutrophils Relative %  59.6 R 65 R   67 R 66.7 R  Total Lymphocyte  32.2 R    24.2 R 25.7 R  Monocytes Relative  6.7 R    7.0 R 6.0 R  Eosinophils Relative  0.9 R    1.1 R 1.1 R  Resulting Agency [facility] [place] [facility] [place] [facility] [place] [facility] [place] [facility] [place] [facility] [place]         Resulting Agency's Comment  Performing Organization Information:     Site IDGweneth Fritter (CLIA: [id number])     Name: Tawni Millers     Address: [address] [place], [place] [address]     Director: Marica Otter MD  Specimen Collected: 05/09/23 14:59 Last Resulted: 05/10/23 00:54      Lab Flowsheet       Order Details       View Encounter       Lab and Collection Details       Routing  Result History     View All Conversations on this Encounter    R=Reference range differs from most recent result in table    Result Care Coordination   Result Notes   Verline Lema, CMA 05/12/2023  3:13 PM EST Back to Top     I am going to mail results to address on file for patient.   Verline Lema, CMA 05/12/2023  3:10 PM EST      Attempted to reach patient again. Phone rings, no answer, no voicemail. Okay for PEC to advise if patient calls back.   Verline Lema, CMA 05/10/2023  8:50 AM EST      Attempted  to reach patient to advise the above per [facility]. Patient did not answer and the phone continued to ring without connecting to voicemail. PEC, okay to advise if patient calls back. Thanks!   Lorre Munroe, NP 05/10/2023  7:35 AM EST      You remain anemic.  You may want to consider oral iron 325 mg 3 times weekly.  Kidney function remains slightly decreased but stable.  Liver function electrolytes are normal.  TSH is elevated.  We need to increase your levothyroxine to 125 mcg daily.  Let me know if she is agreeable and I will send this in how would you feel about seeing endocrinology for further evaluation.  We have had a very hard time getting your TSH within the normal range.  LDL cholesterol has worsened.  Try to consume a low saturated fat diet.  Triglycerides are within normal limits.  She does not have diabetes.   Patient Communication   Add Comments   Not seen Back to Top     Encounters Related to Results  05/12/2023 Letter (Out) 05/09/2023 Office Visit (Ordering Encounter) Back to Top     Contains abnormal data COMPLETE METABOLIC PANEL WITH GFR Order: [id number]  Status: Final result      Next appt: 07/13/2023 at 01:40 PM in Pain Medicine Edward Jolly, MD)      Dx: Hyperlipidemia LDL goal <100      Test Result Released: No (inaccessible in MyChart)    4 Result Notes      View Follow-Up Encounter            Component Ref Range & Units (hover) 2 wk ago (05/09/23) 5 mo ago (11/28/22) 7 mo ago (10/28/22) 8 mo ago (09/01/22) 1 yr ago (04/08/22) 1 yr ago (10/05/21) 1 yr ago (09/15/21)  Glucose, Bld 69 76 CM 94 R, CM 92 R 78 R, CM 83 R, CM 82 R, CM  Comment: .        Non-fasting reference interval .  BUN 20 23 17 22  R 20 18 27  High   Creat 1.17 High  1.24 High  1.01 High  1.14 High  R 1.13 High  R 1.23 High  R 1.26 High  R  eGFR 50 Low  47 Low   52 Low  R 53 Low  48 Low  CM 47 Low  CM  BUN/Creatinine Ratio 17 19 17 19  R 18 15 21   Sodium 140 140 141 142 R 141 143 140   Potassium 4.0 4.0 3.1 Low  4.5 R 4.2 3.8 4.0  Chloride 102 103 102 104 R 108 107 104  CO2 28 30 29 25  R 27 27 28   Calcium 10.3 9.3 9.6 9.9 R 9.6 9.5 10.0  Total Protein 6.4  5.5 Low  6.1 R 5.7 Low  6.0 Low    Albumin 3.9  3.3 Low   3.5 Low  3.7   Globulin 2.5  2.2  2.2 2.3   AG Ratio 1.6  1.5  1.6 1.6   Total Bilirubin 0.4  0.4 <0.2 R 0.2 0.3   Alkaline phosphatase (APISO) 73  63  55 72   AST 11  11 13  R 10 14   ALT 6  4 Low  9 R 7 9   Resulting Agency [facility] [place] [facility] [place] [facility] [place] [facility] [place] [facility] [place] [facility] [place]         Resulting Agency's Comment  Performing Organization Information:     Site IDGweneth Fritter (CLIA: [id number])     Name: Tawni Millers     Address: [address] [place], [place] [address]     Director: Marica Otter MD  Specimen Collected: 05/09/23 14:59 Last Resulted: 05/10/23 00:54      Lab Flowsheet       Order Details       View Encounter       Lab and Collection Details       Routing       Result History     View All Conversations on this Encounter    CM=Additional comments  R=Reference range differs from most recent result in table    Result Care Coordination   Result Notes   Verline Lema, CMA 05/12/2023  3:13 PM EST Back to Top     I am going to mail results to address on file for patient.   Verline Lema, CMA 05/12/2023  3:10 PM EST      Attempted to reach patient again. Phone rings, no answer, no voicemail. Okay for PEC to advise if patient calls back.   Verline Lema, CMA 05/10/2023  8:50 AM EST      Attempted to reach patient to advise the above per [facility]. Patient did not answer and the phone continued to ring without connecting to voicemail. PEC, okay to advise if patient calls back. Thanks!   Lorre Munroe, NP 05/10/2023  7:35 AM EST      You remain anemic.  You may want to consider oral  iron 325 mg 3 times weekly.  Kidney function remains slightly decreased but stable.  Liver function electrolytes are normal.  TSH is elevated.  We need to increase your levothyroxine to 125 mcg daily.  Let me know if she is agreeable and I will send this in how would you feel about seeing endocrinology for further evaluation.  We have had a very hard time getting your TSH within the normal range.  LDL cholesterol has worsened.  Try to consume a low saturated fat diet.  Triglycerides are within normal limits.  She does not have diabetes.   Patient Communication   Add Comments   Not seen Back to Top     Encounters Related to Results  05/12/2023 Letter (Out) 05/09/2023 Office Visit (Ordering Encounter) Back to Top     Contains abnormal data TSH Order: [id number]  Status: Final result      Next appt: 07/13/2023 at 01:40 PM in Pain Medicine Edward Jolly, MD)      Dx: Postoperative hypothyroidism      Test Result Released: No (inaccessible in MyChart)    4 Result Notes      View Follow-Up Encounter            Component Ref  Range & Units (hover) 2 wk ago (05/09/23) 7 mo ago (10/28/22) 8 mo ago (09/01/22) 1 yr ago (04/08/22) 1 yr ago (12/09/21) 1 yr ago (08/06/21) 2 yr ago (03/16/21)  TSH 35.64 High  0.07 Low  2.770 R 0.09 Low  105.76 High  73.34 High  0.34 Low   Resulting Agency [facility] [place] [facility] [place] [facility] [place] [facility] [place] [facility] [place] [facility] [place]         Resulting Agency's Comment  Performing Organization Information:     Site IDGweneth Fritter (CLIA: [id number])     Name: Tawni Millers     Address: [address] [place], [place] [address]     Director: Marica Otter MD  Specimen Collected: 05/09/23 14:59 Last Resulted: 05/10/23 00:54      Lab Flowsheet       Order Details       View Encounter       Lab and Collection Details       Routing        Result History     View All Conversations on this Encounter    R=Reference range differs from most recent result in table    Result Care Coordination   Result Notes   Verline Lema, CMA 05/12/2023  3:13 PM EST Back to Top     I am going to mail results to address on file for patient.   Verline Lema, CMA 05/12/2023  3:10 PM EST      Attempted to reach patient again. Phone rings, no answer, no voicemail. Okay for PEC to advise if patient calls back.   Verline Lema, CMA 05/10/2023  8:50 AM EST      Attempted to reach patient to advise the above per [facility]. Patient did not answer and the phone continued to ring without connecting to voicemail. PEC, okay to advise if patient calls back. Thanks!   Lorre Munroe, NP 05/10/2023  7:35 AM EST      You remain anemic.  You may want to consider oral iron 325 mg 3 times weekly.  Kidney function remains slightly decreased but stable.  Liver function electrolytes are normal.  TSH is elevated.  We need to increase your levothyroxine to 125 mcg daily.  Let me know if she is agreeable and I will send this in how would you feel about seeing endocrinology for further evaluation.  We have had a very hard time getting your TSH within the normal range.  LDL cholesterol has worsened.  Try to consume a low saturated fat diet.  Triglycerides are within normal limits.  She does not have diabetes.   Patient Communication   Add Comments   Not seen Back to Top     Encounters Related to Results  05/12/2023 Letter (Out) 05/09/2023 Office Visit (Ordering Encounter) Back to Top     Contains abnormal data Lipid panel Order: [id number]  Status: Final result      Next appt: 07/13/2023 at 01:40 PM in Pain Medicine Edward Jolly, MD)      Dx: Hyperlipidemia LDL goal <100      Test Result Released: No (inaccessible in MyChart)    4 Result Notes      View Follow-Up Encounter            Component Ref Range & Units (hover) 2 wk ago (05/09/23) 7 mo  ago (10/28/22) 1 yr ago (04/08/22) 1 yr ago (10/05/21) 3 yr ago (08/30/19) 9  yr ago (05/14/14) 9 yr ago (12/18/13)  Cholesterol 189 125 149 212 High  186 289 High  R, CM 233 High  R, CM  HDL 57 43 Low  46 Low  46 Low  43 Low  53.10 R 37.30 Low  R  Triglycerides 113 124 92 119 132 210.0 High  R, CM 179.0 High  R, CM  LDL Cholesterol (Calc) 110 High  61 CM 84 CM 142 High  CM 118 High  CM    Comment: Reference range: <100 . Desirable range <100 mg/dL for primary prevention;   <70 mg/dL for patients with CHD or diabetic patients with > or = 2 CHD risk factors. [name] LDL-C is now calculated using the Martin-Hopkins calculation, which is a validated novel method providing better accuracy than the Friedewald equation in the estimation of LDL-C. Horald Pollen et al. Lenox Ahr. 7829;562(13): 2061-2068 (http://education.[facility].com/faq/FAQ164)  Total CHOL/HDL Ratio 3.3 2.9 3.2 4.6 4.3 5 R, CM 6 R, CM  Non-HDL Cholesterol (Calc) 132 High  82 CM 103 CM 166 High  CM 143 High  CM    Comment: For patients with diabetes plus 1 major ASCVD risk factor, treating to a non-HDL-C goal of <100 mg/dL (LDL-C of <08 mg/dL) is considered a therapeutic option.  Resulting Agency [facility] [place] [facility] [place] [facility] [place] [facility] [place] [facility] [place] [facility] [place] [facility]         Resulting Agency's Comment  Performing Organization Information:     Site IDGweneth Fritter (CLIA: [id number])     Name: Tawni Millers     Address: [address] [place], [place] [address]     Director: Marica Otter MD  Specimen Collected: 05/09/23 14:59 Last Resulted: 05/10/23 00:54      Lab Flowsheet       Order Details       View Encounter       Lab and Collection Details       Routing       Result History     View All Conversations on this Encounter    CM=Additional comments  R=Reference range differs from most recent result in table    Result  Care Coordination   Result Notes   Verline Lema, CMA 05/12/2023  3:13 PM EST Back to Top     I am going to mail results to address on file for patient.   Verline Lema, CMA 05/12/2023  3:10 PM EST      Attempted to reach patient again. Phone rings, no answer, no voicemail. Okay for PEC to advise if patient calls back.   Verline Lema, CMA 05/10/2023  8:50 AM EST      Attempted to reach patient to advise the above per [facility]. Patient did not answer and the phone continued to ring without connecting to voicemail. PEC, okay to advise if patient calls back. Thanks!   Lorre Munroe, NP 05/10/2023  7:35 AM EST      You remain anemic.  You may want to consider oral iron 325 mg 3 times weekly.  Kidney function remains slightly decreased but stable.  Liver function electrolytes are normal.  TSH is elevated.  We need to increase your levothyroxine to 125 mcg daily.  Let me know if she is agreeable and I will send this in how would you feel about seeing endocrinology for further evaluation.  We have had a very hard time getting your TSH within the normal range.  LDL cholesterol has  worsened.  Try to consume a low saturated fat diet.  Triglycerides are within normal limits.  She does not have diabetes.   Patient Communication   Add Comments   Not seen Back to Top     Encounters Related to Results  05/12/2023 Letter (Out) 05/09/2023 Office Visit (Ordering Encounter) Back to Top    Hemoglobin A1c Order: [id number]  Status: Final result      Next appt: 07/13/2023 at 01:40 PM in Pain Medicine Edward Jolly, MD)      Dx: Elevated random blood glucose level      Test Result Released: No (inaccessible in MyChart)    4 Result Notes      View Follow-Up Encounter         Component Ref Range & Units (hover) 2 wk ago 1 yr ago 4 yr ago 8 yr ago  Hgb A1c MFr Bld 5.1 4.9 CM 5.1 CM 4.8 R, CM  Comment: For the purpose of screening for the presence of diabetes: . <5.7%       Consistent with  the absence of diabetes 5.7-6.4%    Consistent with increased risk for diabetes             (prediabetes) > or =6.5%  Consistent with diabetes . This assay result is consistent with a decreased risk of diabetes. . Currently, no consensus exists regarding use of hemoglobin A1c for diagnosis of diabetes in children. . According to American Diabetes Association (ADA) guidelines, hemoglobin A1c <7.0% represents optimal control in non-pregnant diabetic patients. Different metrics may apply to specific patient populations. Standards of Medical Care in Diabetes(ADA). .  Mean Plasma Glucose 100 94 100 R   eAG (mmol/L) 5.5 5.2 5.5 R   Resulting Agency [facility] [place] [facility] [place] [facility] [place] [facility]         Resulting Agency's Comment  Performing Organization Information:     Site IDGweneth Fritter (CLIA: [id number])     Name: Tawni [place]ers     Address: [address] [place], [place] [address]     Director: Marica Otter MD  Specimen Collected: 05/09/23 14:59 Last Resulted: 05/10/23 00:54      Lab Flowsheet       Order Details       View Encounter       Lab and Collection Details       Routing       Result History     View All Conversations on this Encounter    CM=Additional comments  R=Reference range differs from most recent result in table    Result Care Coordination   Result Notes   Verline Lema, CMA 05/12/2023  3:13 PM EST Back to Top     I am going to mail results to address on file for patient.   Verline Lema, CMA 05/12/2023  3:10 PM EST      Attempted to reach patient again. Phone rings, no answer, no voicemail. Okay for PEC to advise if patient calls back.   Verline Lema, CMA 05/10/2023  8:50 AM EST      Attempted to reach patient to advise the above per [facility]. Patient did not answer and the phone continued to ring without connecting to voicemail. PEC, okay to advise if patient calls back. Thanks!   Lorre Munroe, NP 05/10/2023  7:35 AM EST      You remain anemic.  You may want to consider oral iron 325 mg 3 times weekly.  Kidney function remains slightly decreased but stable.  Liver function electrolytes are normal.  TSH is elevated.  We need to increase your levothyroxine to 125 mcg daily.  Let me know if she is agreeable and I will send this in how would you feel about seeing endocrinology for further evaluation.  We have had a very hard time getting your TSH within the normal range.  LDL cholesterol has worsened.  Try to consume a low saturated fat diet.  Triglycerides are within normal limits.  She does not have diabetes.   Patient Communication   Add Comments   Not seen Back to Top     Encounters Related to Results  05/12/2023 Letter (Out) 05/09/2023 Office Visit (Ordering Encounter) Back to Top   Result Read / Acknowledged  Acknowledge result User Time Read / Soyla Dryer, NP 05/10/2023  7:35 AM  Luanna Cole D, CMA 05/12/2023  3:41 PM  Argentina Ponder, CMA 05/12/2023  3:44 PM  Mabe, Currie Paris, RN 05/23/2023  5:33 PM    T4, free (Order [id number]) MyChart Results Release  MyChart Status: Code Expired  Results Release    T4, free: Result Notes   Verline Lema, CMA 05/12/2023  3:13 PM EST     I am going to mail results to address on file for patient.   Verline Lema, CMA 05/12/2023  3:10 PM EST     Attempted to reach patient again. Phone rings, no answer, no voicemail. Okay for PEC to advise if patient calls back.   Verline Lema, CMA 05/10/2023  8:50 AM EST     Attempted to reach patient to advise the above per [facility]. Patient did not answer and the phone continued to ring without connecting to voicemail. PEC, okay to advise if patient calls back. Thanks!   Lorre Munroe, NP 05/10/2023  7:35 AM EST     You remain anemic.  You may want to consider oral iron 325 mg 3 times weekly.  Kidney function remains slightly decreased but stable.  Liver function electrolytes  are normal.  TSH is elevated.  We need to increase your levothyroxine to 125 mcg daily.  Let me know if she is agreeable and I will send this in how would you feel about seeing endocrinology for further evaluation.  We have had a very hard time getting your TSH within the normal range.  LDL cholesterol has worsened.  Try to consume a low saturated fat diet.  Triglycerides are within normal limits.  She does not have diabetes.         T4, free: Patient Communication   Add Comments   Not seen    All Reviewers List  Mabe, Currie Paris, RN on 05/23/2023 17:33  Argentina Ponder, CMA on 05/12/2023 15:44  Luanna Cole D, CMA on 05/12/2023 15:41  Lorre Munroe, NP on 05/10/2023 07:35   Encounter  View Encounter        Result Information  Flag: Abnormal Abnormal  Status: Final result (Collected: 05/09/2023 14:59) Provider Status: Reviewed    Lab Information    [facility]              Order-Level Documents:  There are no order-level documents.  View SmartLink Info  T4, free (Order #[id number]) on 05/09/23   Reason for Disposition . [1] Follow-up call to recent contact AND [2] information only call, no triage required  Answer Assessment - Initial Assessment Questions 1. REASON FOR CALL or QUESTION: "What is  your reason for calling today?" or "How can I best help you?" or "What question do you have that I can help answer?"     Given her lab result message from Nicki Reaper, NP  Protocols used: Information Only Call - No Triage-A-AH

## 2023-05-26 NOTE — Telephone Encounter (Signed)
Pt received her letter and returned the call.   I read her the result message from [facility], [facility] dated 05/10/2023 atb7:35 AM.  She is ok with increasing the Synthroid to 125 mcg so call it in to her pharmacy.   She would like to do the endocrinologist referral but please let it be someone in [place], not [place].   "I get lost in [place]".

## 2023-05-29 NOTE — Telephone Encounter (Signed)
 Referral placed.

## 2023-05-29 NOTE — Addendum Note (Signed)
Addended by: Lorre Munroe on: 05/29/2023 07:58 AM   Modules accepted: Orders

## 2023-07-13 ENCOUNTER — Encounter: Payer: Self-pay | Admitting: Student in an Organized Health Care Education/Training Program

## 2023-07-13 ENCOUNTER — Ambulatory Visit
Payer: Medicare HMO | Attending: Student in an Organized Health Care Education/Training Program | Admitting: Student in an Organized Health Care Education/Training Program

## 2023-07-13 VITALS — BP 103/73 | HR 96 | Temp 97.4°F | Resp 16 | Ht 68.0 in | Wt 177.0 lb

## 2023-07-13 DIAGNOSIS — M5416 Radiculopathy, lumbar region: Secondary | ICD-10-CM | POA: Insufficient documentation

## 2023-07-13 DIAGNOSIS — M1712 Unilateral primary osteoarthritis, left knee: Secondary | ICD-10-CM | POA: Insufficient documentation

## 2023-07-13 DIAGNOSIS — M48062 Spinal stenosis, lumbar region with neurogenic claudication: Secondary | ICD-10-CM | POA: Diagnosis present

## 2023-07-13 DIAGNOSIS — G8929 Other chronic pain: Secondary | ICD-10-CM | POA: Diagnosis present

## 2023-07-13 DIAGNOSIS — G894 Chronic pain syndrome: Secondary | ICD-10-CM | POA: Insufficient documentation

## 2023-07-13 DIAGNOSIS — M170 Bilateral primary osteoarthritis of knee: Secondary | ICD-10-CM | POA: Diagnosis present

## 2023-07-13 MED ORDER — HYDROCODONE-ACETAMINOPHEN 7.5-325 MG PO TABS: 1.0000 | ORAL_TABLET | Freq: Four times a day (QID) | ORAL | 0 refills | Status: AC | PRN

## 2023-07-13 MED ORDER — HYDROCODONE-ACETAMINOPHEN 7.5-325 MG PO TABS: 1.0000 | ORAL_TABLET | Freq: Four times a day (QID) | ORAL | 0 refills | Status: DC | PRN

## 2023-07-13 NOTE — Patient Instructions (Signed)
 ______________________________________________________________________    General Risks and Possible Complications  Patient Responsibilities: It is important that you read this as it is part of your informed consent. It is our duty to inform you of the risks and possible complications associated with treatments offered to you. It is your responsibility as a patient to read this and to ask questions about anything that is not clear or that you believe was not covered in this document.  Patient's Rights: You have the right to refuse treatment. You also have the right to change your mind, even after initially having agreed to have the treatment done. However, under this last option, if you wait until the last second to change your mind, you may be charged for the materials used up to that point.  Introduction: Medicine is not an Visual merchandiser. Everything in Medicine, including the lack of treatment(s), carries the potential for danger, harm, or loss (which is by definition: Risk). In Medicine, a complication is a secondary problem, condition, or disease that can aggravate an already existing one. All treatments carry the risk of possible complications. The fact that a side effects or complications occurs, does not imply that the treatment was conducted incorrectly. It must be clearly understood that these can happen even when everything is done following the highest safety standards.  No treatment: You can choose not to proceed with the proposed treatment alternative. The "PRO(s)" would include: avoiding the risk of complications associated with the therapy. The "CON(s)" would include: not getting any of the treatment benefits. These benefits fall under one of three categories: diagnostic; therapeutic; and/or palliative. Diagnostic benefits include: getting information which can ultimately lead to improvement of the disease or symptom(s). Therapeutic benefits are those associated with the successful  treatment of the disease. Finally, palliative benefits are those related to the decrease of the primary symptoms, without necessarily curing the condition (example: decreasing the pain from a flare-up of a chronic condition, such as incurable terminal cancer).  General Risks and Complications: These are associated to most interventional treatments. They can occur alone, or in combination. They fall under one of the following six (6) categories: no benefit or worsening of symptoms; bleeding; infection; nerve damage; allergic reactions; and/or death. No benefits or worsening of symptoms: In Medicine there are no guarantees, only probabilities. No healthcare provider can ever guarantee that a medical treatment will work, they can only state the probability that it may. Furthermore, there is always the possibility that the condition may worsen, either directly, or indirectly, as a consequence of the treatment. Bleeding: This is more common if the patient is taking a blood thinner, either prescription or over the counter (example: Goody Powders, Fish oil, Aspirin, Garlic, etc.), or if suffering a condition associated with impaired coagulation (example: Hemophilia, cirrhosis of the liver, low platelet counts, etc.). However, even if you do not have one on these, it can still happen. If you have any of these conditions, or take one of these drugs, make sure to notify your treating physician. Infection: This is more common in patients with a compromised immune system, either due to disease (example: diabetes, cancer, human immunodeficiency virus [HIV], etc.), or due to medications or treatments (example: therapies used to treat cancer and rheumatological diseases). However, even if you do not have one on these, it can still happen. If you have any of these conditions, or take one of these drugs, make sure to notify your treating physician. Nerve Damage: This is more common when the treatment is  an invasive one, but it  can also happen with the use of medications, such as those used in the treatment of cancer. The damage can occur to small secondary nerves, or to large primary ones, such as those in the spinal cord and brain. This damage may be temporary or permanent and it may lead to impairments that can range from temporary numbness to permanent paralysis and/or brain death. Allergic Reactions: Any time a substance or material comes in contact with our body, there is the possibility of an allergic reaction. These can range from a mild skin rash (contact dermatitis) to a severe systemic reaction (anaphylactic reaction), which can result in death. Death: In general, any medical intervention can result in death, most of the time due to an unforeseen complication. ______________________________________________________________________     Knee Injection A knee injection is a procedure to get medicine in your knee joint to help relieve the pain, swelling, and stiffness that is caused by arthritis. The medicine may also cushion your knee joint. Your health care provider will use a needle to inject the medicine. You may need more than one injection. Tell a health care provider about: Any allergies you have. All medicines you take. These include vitamins, herbs, eye drops, and creams. Any problems you or family members have had with anesthesia. Any bleeding problems you have. Any surgeries you've had. Any medical conditions you have. Whether you're pregnant or may be pregnant. What are the risks? Your provider will talk with you about risks. These may include: Infection. Bleeding. Symptoms that get worse. Damage to the area around your knee. Allergic reaction to the medicines. Skin reactions from having many injections. What happens before the procedure? Ask about changing or stopping: Any medicines you take. Any vitamins, herbs, or supplements you take. Do not take aspirin or ibuprofen unless you're told  to. Plan to have a responsible adult drive you home from the hospital or clinic. You won't be allowed to drive. What happens during the procedure?  You will sit or lie down in a position for your knee to be treated. The skin over your kneecap will be cleaned with a soap that kills germs. You will be given a medicine that numbs the area. You may feel some stinging. The medicine will be injected into your knee. The needle is placed between your kneecap and your knee. The medicine is injected into the joint space. The needle will be taken out. A bandage may be placed over the injection site. The procedure may vary among providers and hospitals. What can I expect after the procedure? Your blood pressure, heart rate, breathing rate, and blood oxygen level will be monitored until you leave the hospital or clinic. You may have to move your knee through its full range of motion. This helps to get the medicine into the joint space. You will be watched to make sure that you do not have a reaction to the injection. You may feel more pain, swelling, and warmth than you did before the injection. This reaction may last about 1-2 days. Follow these instructions at home: Medicines Take over-the-counter and prescription medicines only as told by your provider. Ask your provider if the medicine prescribed to you requires you to avoid driving or using machinery. Do not take medicines such as aspirin and ibuprofen unless your provider tells you to. Injection site care Follow instructions from your provider about: How to take care of your injection site. When and how you should change your bandage. When  you should take off your bandage. Check your injection area every day for signs of infection. Check for: More redness, swelling, or pain after 2 days. Fluid or blood. Warmth. Pus or a bad smell. Managing pain, stiffness, and swelling  Use ice or an ice pack as told. Place a towel between your skin and  the ice. Leave the ice on for 20 minutes, 2-3 times a day. If your skin turns red, take off the ice right away to prevent skin damage. The risk of damage is higher if you can't feel pain, heat, or cold. Do not put heat to your knee. Raise your knee above the level of your heart while you're sitting or lying down. General instructions If you have a bandage, keep it dry until your provider says it can be taken off. Ask your provider when you can start taking showers and baths. Avoid activities that take a lot of effort for as long as told by your provider. Ask what things are safe for you to do at home. Ask when you can go back to work or school. Keep all follow-up visits. You may need more injections. Contact a health care provider if: You have a fever. You have any signs of infection. Your symptoms at the injection site last longer than 2 days after your procedure. Get help right away if: Your knee turns very red. Your knee becomes very swollen. You have very bad knee pain. This information is not intended to replace advice given to you by your health care provider. Make sure you discuss any questions you have with your health care provider. Document Revised: 07/26/2022 Document Reviewed: 07/26/2022 [employer] Patient Education  2024 [employer].

## 2023-07-13 NOTE — Progress Notes (Signed)
 PROVIDER NOTE: Interpretation of information contained herein should be left to medically-trained personnel. Specific patient instructions are provided elsewhere under "Patient Instructions" section of medical record. This document was created in part using AI and STT-dictation technology, any transcriptional errors that may result from this process are unintentional.  Patient: Pamela Romero  Service: E/M   PCP: Lorre Munroe, NP  DOB: 1950-06-07  DOS: 07/13/2023  Provider: Bettey Costa, NP  MRN: [id number]  Delivery: Face-to-face  Specialty: Interventional Pain Management  Type: Established Patient  Setting: Ambulatory outpatient facility  Specialty designation: 09  Referring Prov.: Lorre Munroe, NP  Location: Outpatient office facility       HPI  [name], a 73 y.o. 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 [name] primary complain today is Knee Pain (Left "bone on bone" )   Pain Assessment: Severity of Chronic pain is reported as a  /10. Location: Knee Left/knee pain goes down to the top of foot on the left. Onset: More than a month ago. Quality: Discomfort, Constant. Timing: Constant. Modifying factor(s): medications. Vitals:  height is 5\' 8"  (1.727 m) and weight is 177 lb (80.3 kg). Her temporal temperature is 97.4 F (36.3 C) (abnormal). Her blood pressure is 103/73 and her pulse is 96. Her respiration is 16 and oxygen saturation is 99%.  BMI: Estimated body mass index is 26.91 kg/m as calculated from the following:   Height as of this encounter: 5\' 8"  (1.727 m).   Weight as of this encounter: 177 lb (80.3 kg). Last encounter: 04/18/2023. Last procedure: 02/15/2023.  Reason for encounter: medication management [name], a 73 year old female is here with left knee pain that worsen, radiates radiated to the left leg and foot.  She also complain left foot toes cramps. She got 50 %  relief from steroid knee injection (long-acting Kenalog) performed 02/15/2023 for bilateral knees.  She is not complaining of right knee pain today primarily left knee pain.      Pharmacotherapy Assessment  Analgesic: hydrocodone-acetaminophen (NORCO) 7.5-325 mg every 6 hours as needed for moderate pain (pain score 4-6); MME = 26.25  Monitoring: [place] PMP: PDMP reviewed during this encounter.       Pharmacotherapy: No side-effects or adverse reactions reported. Compliance: No problems identified. Effectiveness: Clinically acceptable.  Pamela Ammons, RN  07/13/2023  1:51 PM  Sign when Signing Visit  Nursing Pain Medication Assessment:  Safety precautions to be maintained throughout the outpatient stay will include: orient to surroundings, keep bed in low position, maintain call bell within reach at all times, provide assistance with transfer out of bed and ambulation.  Medication Inspection Compliance: Pill count conducted under aseptic conditions, in front of the patient. Neither the pills nor the bottle was removed from the patient's sight at any time. Once count was completed pills were immediately returned to the patient in their original bottle.  Medication: Hydrocodone/APAP Pill/Patch Count:  2 of 105 pills remain Pill/Patch Appearance: Markings consistent with prescribed medication Bottle Appearance: Standard pharmacy container. Clearly labeled. Filled Date: 03 / 08 / 2025 Last Medication intake:  Day before yesterday  No results found for: "CBDTHCR" No results found for: "D8THCCBX" No results found for: "D9THCCBX"  UDS:  Summary  Date Value Ref Range Status  10/12/2022 Note  Final    Comment:    ==================================================================== [employer] 13 (MW) ==================================================================== Test  Result       Flag       Units  Drug Present and Declared for Prescription  Verification   Hydrocodone                    4019         EXPECTED   ng/mg creat   Hydromorphone                  840          EXPECTED   ng/mg creat   Dihydrocodeine                 793          EXPECTED   ng/mg creat   Norhydrocodone                 3106         EXPECTED   ng/mg creat    Sources of hydrocodone include scheduled prescription medications.    Hydromorphone, dihydrocodeine and norhydrocodone are expected    metabolites of hydrocodone. Hydromorphone and dihydrocodeine are    also available as scheduled prescription medications.  ==================================================================== Test                      Result    Flag   Units      Ref Range   Creatinine              113              mg/dL      >=16 ==================================================================== Declared Medications:  The flagging and interpretation on this report are based on the  following declared medications.  Unexpected results may arise from  inaccuracies in the declared medications.   **Note: The testing scope of this panel includes these medications:   Hydrocodone (Norco)   **Note: The testing scope of this panel does not include the  following reported medications:   Acetaminophen (Norco)  Albuterol (Ventolin HFA)  Aspirin  Atorvastatin (Lipitor)  Docusate (Colace)  Esomeprazole (Nexium)  Famotidine (Pepcid)  Gabapentin (Neurontin)  Hydrochlorothiazide (Zestoretic)  Levothyroxine (Synthroid)  Lisinopril (Zestoretic)  Ondansetron (Zofran)  Polyethylene Glycol (GlycoLax)  Rizatriptan (Maxalt)  Sertraline (Zoloft) ==================================================================== For clinical consultation, please call [phone]. ====================================================================       ROS  Constitutional: Denies any fever or chills Gastrointestinal: No reported hemesis, hematochezia, vomiting, or acute GI distress Musculoskeletal:  Left knee pain, foot pain (Anterior) Neurological: No reported episodes of acute onset apraxia, aphasia, dysarthria, agnosia, amnesia, paralysis, loss of coordination, or loss of consciousness  Medication Review  HYDROcodone-acetaminophen, albuterol, aspirin EC, atorvastatin, esomeprazole, famotidine, gabapentin, levothyroxine, linaclotide, lisinopril-hydrochlorothiazide, ondansetron, polyethylene glycol powder, potassium chloride SA, and rizatriptan  History Review  Allergy: [name] has no known allergies. Drug: [name]  reports no history of drug use. Alcohol:  reports no history of alcohol use. Tobacco:  reports that she quit smoking about 5 years ago. Her smoking use included cigarettes. She started smoking about 55 years ago. She has a 25 pack-year smoking history. She has quit using smokeless tobacco. Social: [name]  reports that she quit smoking about 5 years ago. Her smoking use included cigarettes. She started smoking about 55 years ago. She has a 25 pack-year smoking history. She has quit using smokeless tobacco. She reports that she does not drink alcohol and does not use drugs. Medical:  has a past medical history of Abnormal EKG, CAD (coronary  artery disease), Cancer (HCC), COPD (chronic obstructive pulmonary disease) (HCC), Endometrial adenocarcinoma (HCC) (2011), Hyperlipidemia, Hypertension, Osteoarthritis, Reflux gastritis, Thyroid disease, tobacco abuse, and Vaginal delivery. Surgical: [name]  has a past surgical history that includes Thyroidectomy (2005); Cardiac catheterization (2006); Total abdominal hysterectomy; Breast surgery (Right, March 2014); Breast cyst aspiration (Right, 2014); Esophagogastroduodenoscopy (N/A, 04/07/2020); Esophagogastroduodenoscopy (egd) with propofol (N/A, 11/07/2022); biopsy (11/07/2022); Balloon dilation (11/07/2022); and polypectomy (11/07/2022). Family: family history includes Alcohol abuse in her father, maternal aunt, and mother;  Cancer in her brother, brother, brother, and maternal aunt; Heart disease in her brother.  Laboratory Chemistry Profile   Renal Lab Results  Component Value Date   BUN 20 05/09/2023   CREATININE 1.17 (H) 05/09/2023   BCR 17 05/09/2023   GFR 60.34 01/21/2015   GFRAA 61 01/02/2020   GFRNONAA >60 04/07/2020    Hepatic Lab Results  Component Value Date   AST 11 05/09/2023   ALT 6 05/09/2023   ALBUMIN 3.8 (L) 09/01/2022   ALKPHOS 77 09/01/2022   HCVAB NEGATIVE 01/21/2015   LIPASE 13 (L) 09/01/2022    Electrolytes Lab Results  Component Value Date   NA 140 05/09/2023   K 4.0 05/09/2023   CL 102 05/09/2023   CALCIUM 10.3 05/09/2023   MG 1.3 (L) 09/15/2021    Bone Lab Results  Component Value Date   VD25OH 18 (L) 12/09/2021    Inflammation (CRP: Acute Phase) (ESR: Chronic Phase) Lab Results  Component Value Date   ESRSEDRATE 19 03/30/2012         Note: Above Lab results reviewed.  Recent Imaging Review  ECHOCARDIOGRAM COMPLETE    ECHOCARDIOGRAM REPORT       Patient Name:   Pamela Romero [facility] Date of Exam: 06/22/2022 Medical Rec #:  [id number]        Height:       68.5 in Accession #:    [id number]       Weight:       196.0 lb Date of Birth:  05-08-1950        BSA:          2.037 m Patient Age:    71 years         BP:           148/88 mmHg Patient Gender: F                HR:           90 bpm. Exam Location:  [place]  Procedure: 2D Echo, Color Doppler and Cardiac Doppler  Indications:    R60.0 Lower extremity edema; I50.20* Unspecified systolic                 (congestive) heart failure   History:        Patient has prior history of Echocardiogram examinations, most                 recent 11/27/2018. CHF, Signs/Symptoms:Edema; Risk                 Factors:Hypertension, Dyslipidemia and Former Smoker.   Sonographer:    Quentin Ore RDMS, RVT, RDCS Referring Phys: [id number] [facility]    Sonographer Comments: Suboptimal parasternal window. IMPRESSIONS   1.  Left ventricular ejection fraction, by estimation, is 60 to 65%. The left ventricle has normal function. The left ventricle has no regional wall motion abnormalities. Left ventricular diastolic parameters are consistent with Grade I diastolic  dysfunction (impaired relaxation).  2. Right ventricular systolic function is normal. The right ventricular size is normal. There is normal pulmonary artery systolic pressure. The estimated right ventricular systolic pressure is 28.2 mmHg.  3. The mitral valve is normal in structure. No evidence of mitral valve regurgitation. No evidence of mitral stenosis.  4. The aortic valve is tricuspid. Aortic valve regurgitation is not visualized. Aortic valve sclerosis is present, with no evidence of aortic valve stenosis.  5. The inferior vena cava is normal in size with greater than 50% respiratory variability, suggesting right atrial pressure of 3 mmHg.  FINDINGS  Left Ventricle: Left ventricular ejection fraction, by estimation, is 60 to 65%. The left ventricle has normal function. The left ventricle has no regional wall motion abnormalities. The left ventricular internal cavity size was normal in size. There is  no left ventricular hypertrophy. Left ventricular diastolic parameters are consistent with Grade I diastolic dysfunction (impaired relaxation).  Right Ventricle: The right ventricular size is normal. No increase in right ventricular wall thickness. Right ventricular systolic function is normal. There is normal pulmonary artery systolic pressure. The tricuspid regurgitant velocity is 2.41 m/s, and  with an assumed right atrial pressure of 5 mmHg, the estimated right ventricular systolic pressure is 28.2 mmHg.  Left Atrium: Left atrial size was normal in size.  Right Atrium: Right atrial size was normal in size.  Pericardium: There is no evidence of pericardial effusion.  Mitral Valve: The mitral valve is normal in structure. No evidence of mitral valve  regurgitation. No evidence of mitral valve stenosis.  Tricuspid Valve: The tricuspid valve is normal in structure. Tricuspid valve regurgitation is mild . No evidence of tricuspid stenosis.  Aortic Valve: The aortic valve is tricuspid. Aortic valve regurgitation is not visualized. Aortic valve sclerosis is present, with no evidence of aortic valve stenosis. Aortic valve mean gradient measures 4.0 mmHg. Aortic valve peak gradient measures 7.8  mmHg. Aortic valve area, by VTI measures 2.13 cm.  Pulmonic Valve: The pulmonic valve was normal in structure. Pulmonic valve regurgitation is not visualized. No evidence of pulmonic stenosis.  Aorta: The aortic root is normal in size and structure.  Venous: The inferior vena cava is normal in size with greater than 50% respiratory variability, suggesting right atrial pressure of 3 mmHg.  IAS/Shunts: No atrial level shunt detected by color flow Doppler.    LEFT VENTRICLE PLAX 2D LVOT diam:     1.90 cm     Diastology LV SV:         52          LV e' medial:    4.68 cm/s LV SV Index:   26          LV E/e' medial:  13.3 LVOT Area:     2.84 cm    LV e' lateral:   8.81 cm/s                            LV E/e' lateral: 7.1   LV Volumes (MOD) LV vol d, MOD A2C: 46.5 ml LV vol d, MOD A4C: 51.9 ml LV vol s, MOD A2C: 22.2 ml LV vol s, MOD A4C: 24.7 ml LV SV MOD A2C:     24.3 ml LV SV MOD A4C:     51.9 ml LV SV MOD BP:      28.3 ml  RIGHT VENTRICLE RV S prime:     17.50 cm/s TAPSE (M-mode): 1.9 cm  LEFT  ATRIUM           Index LA Vol (A4C): 59.5 ml 29.21 ml/m  AORTIC VALVE                    PULMONIC VALVE AV Area (Vmax):    2.07 cm     PV Vmax:       0.96 m/s AV Area (Vmean):   2.02 cm     PV Peak grad:  3.6 mmHg AV Area (VTI):     2.13 cm AV Vmax:           140.00 cm/s AV Vmean:          94.500 cm/s AV VTI:            0.245 m AV Peak Grad:      7.8 mmHg AV Mean Grad:      4.0 mmHg LVOT Vmax:         102.00 cm/s LVOT Vmean:         67.400 cm/s LVOT VTI:          0.184 m LVOT/AV VTI ratio: 0.75   AORTA Ao Root diam: 3.30 cm Ao Asc diam:  3.60 cm Ao Arch diam: 2.6 cm  MITRAL VALVE               TRICUSPID VALVE MV Area (PHT): 3.99 cm    TR Peak grad:   23.2 mmHg MV Decel Time: 190 msec    TR Vmax:        241.00 cm/s MV E velocity: 62.40 cm/s MV A velocity: 94.90 cm/s  SHUNTS MV E/A ratio:  0.66        Systemic VTI:  0.18 m                            Systemic Diam: 1.90 cm  Julien Nordmann MD Electronically signed by Julien Nordmann MD Signature Date/Time: 06/23/2022/3:31:19 PM      Final   Note: Reviewed        Physical Exam  General appearance: alert, cooperative, oriented, and Well nourished, well developed, and well hydrated. In no apparent acute distress Mental status: Alert, oriented x 3 (person, place, & time)       Respiratory: No evidence of acute respiratory distress Eyes: PERLA Vitals: BP 103/73 (BP Location: Left Arm, Patient Position: Sitting, Cuff Size: Normal)   Pulse 96   Temp (!) 97.4 F (36.3 C) (Temporal)   Resp 16   Ht 5\' 8"  (1.727 m)   Wt 177 lb (80.3 kg)   SpO2 99%   BMI 26.91 kg/m  BMI: Estimated body mass index is 26.91 kg/m as calculated from the following:   Height as of this encounter: 5\' 8"  (1.727 m).   Weight as of this encounter: 177 lb (80.3 kg). Ideal: Ideal body weight: 63.9 kg (140 lb 14 oz) Adjusted ideal body weight: 70.5 kg (155 lb 5.2 oz)  + left knee pain., worse with WB  Assessment   Diagnosis Status  1. Spinal stenosis, lumbar region, with neurogenic claudication   2. Chronic radicular lumbar pain   3. Bilateral primary osteoarthritis of knee   4. Primary osteoarthritis of left knee   5. Chronic pain syndrome    Stable Stable Having a Flare-up   Updated Problems: No problems updated.  Plan of Care  Problem-specific:  Assessment and Plan  Patient's got 50 % relief from steroid knee injection (long-acting  Kenalog) on 04/18/2023. Future order for  steroid knee injection and continue with hydrocodone-acetaminophen (NORCO) 7.5-325 mg every 6 hours as needed for moderate pain.  Ms. JETTIE MANNOR has a current medication list which includes the following long-term medication(s): albuterol, atorvastatin, esomeprazole, famotidine, gabapentin, levothyroxine, linaclotide, lisinopril-hydrochlorothiazide, potassium chloride sa, and rizatriptan.  Pharmacotherapy (Medications Ordered): Meds ordered this encounter  Medications   HYDROcodone-acetaminophen (NORCO) 7.5-325 MG tablet    Sig: Take 1 tablet by mouth every 6 (six) hours as needed for moderate pain (pain score 4-6).    Dispense:  105 tablet    Refill:  0   HYDROcodone-acetaminophen (NORCO) 7.5-325 MG tablet    Sig: Take 1 tablet by mouth every 6 (six) hours as needed for moderate pain (pain score 4-6).    Dispense:  105 tablet    Refill:  0   HYDROcodone-acetaminophen (NORCO) 7.5-325 MG tablet    Sig: Take 1 tablet by mouth every 6 (six) hours as needed for moderate pain (pain score 4-6).    Dispense:  105 tablet    Refill:  0   Orders:  Orders Placed This Encounter  Procedures   KNEE INJECTION    Local Anesthetic & Steroid injection.    Standing Status:   Future    Expiration Date:   10/12/2023    Scheduling Instructions:     Procedure: Steroid knee injection(ER Kennalog)     Side: LEFT     Timeframe: As soon as schedule allows    Where will this procedure be performed?:   [facility]   Follow-up plan:   Return in about 4 weeks (around 08/10/2023) for Left knee ER Kennalog.     MM - Hydrocodone-acetaminophen (NORCO) 7.5-325 mg every 6 hours as needed for moderate pain (pain score 4-6)   Recent Visits Date Type Provider Dept  04/18/23 Office Visit Edward Jolly, MD [facility]  Showing recent visits within past 90 days and meeting all other requirements Today's Visits Date Type Provider Dept  07/13/23 Office Visit Edward Jolly, MD [facility]  Showing today's visits and meeting all other requirements Future Appointments No visits were found meeting these conditions. Showing future appointments within next 90 days and meeting all other requirements  I discussed the assessment and treatment plan with the patient. The patient was provided an opportunity to ask questions and all were answered. The patient agreed with the plan and demonstrated an understanding of the instructions.  Patient advised to call back or seek an in-person evaluation if the symptoms or condition worsens.  Duration of encounter: 30 minutes.  Total time on encounter, as per AMA guidelines included both the face-to-face and non-face-to-face time personally spent by the physician and/or other qualified health care professional(s) on the day of the encounter (includes time in activities that require the physician or other qualified health care professional and does not include time in activities normally performed by clinical staff). Physician's time may include the following activities when performed: Preparing to see the patient (e.g., pre-charting review of records, searching for previously ordered imaging, lab work, and nerve conduction tests) Review of prior analgesic pharmacotherapies. Reviewing PMP Interpreting ordered tests (e.g., lab work, imaging, nerve conduction tests) Performing post-procedure evaluations, including interpretation of diagnostic procedures Obtaining and/or reviewing separately obtained history Performing a medically appropriate examination and/or evaluation Counseling and educating the patient/family/caregiver Ordering medications, tests, or procedures Referring and communicating with other health care professionals (when not separately reported) Documenting clinical information in the  electronic or other health record Independently interpreting results (not separately reported) and communicating results to the patient/  family/caregiver Care coordination (not separately reported)  Note by: Bettey Costa, NP (TTS and AI technology used. I apologize for any typographical errors that were not detected and corrected.) Date: 07/13/2023; Time: 2:24 PM

## 2023-07-13 NOTE — Progress Notes (Signed)
 Nursing Pain Medication Assessment:  Safety precautions to be maintained throughout the outpatient stay will include: orient to surroundings, keep bed in low position, maintain call bell within reach at all times, provide assistance with transfer out of bed and ambulation.  Medication Inspection Compliance: Pill count conducted under aseptic conditions, in front of the patient. Neither the pills nor the bottle was removed from the patient's sight at any time. Once count was completed pills were immediately returned to the patient in their original bottle.  Medication: Hydrocodone/APAP Pill/Patch Count:  2 of 105 pills remain Pill/Patch Appearance: Markings consistent with prescribed medication Bottle Appearance: Standard pharmacy container. Clearly labeled. Filled Date: 03 / 08 / 2025 Last Medication intake:  Day before yesterday

## 2023-08-08 ENCOUNTER — Encounter: Payer: Self-pay | Admitting: Internal Medicine

## 2023-08-08 ENCOUNTER — Ambulatory Visit
Admission: RE | Admit: 2023-08-08 | Discharge: 2023-08-08 | Disposition: A | Discharge status: Home / Self Care | Attending: Internal Medicine | Admitting: Internal Medicine

## 2023-08-08 ENCOUNTER — Ambulatory Visit (INDEPENDENT_AMBULATORY_CARE_PROVIDER_SITE_OTHER): Admitting: Internal Medicine

## 2023-08-08 ENCOUNTER — Ambulatory Visit
Admission: RE | Admit: 2023-08-08 | Discharge: 2023-08-08 | Disposition: A | Discharge status: Home / Self Care | Source: Ambulatory Visit | Attending: Internal Medicine | Admitting: Internal Medicine

## 2023-08-08 VITALS — BP 92/64 | Ht 68.0 in | Wt 175.4 lb

## 2023-08-08 DIAGNOSIS — M48061 Spinal stenosis, lumbar region without neurogenic claudication: Secondary | ICD-10-CM

## 2023-08-08 NOTE — Progress Notes (Signed)
 Subjective:    Patient ID: Pamela Romero, female    DOB: 1950-08-19, 73 y.o.   MRN: [id number]  HPI  Discussed the use of AI scribe software for clinical note transcription with the patient, who gave verbal consent to proceed.  Pamela Romero is a 73 year old female with chronic back pain who presents with worsening back pain.  She has experienced chronic back pain for at least ten years, with fluctuating intensity over the last three to four years. The pain is sharp and stabbing, located bilaterally in the lower back, and radiates into both legs. She experiences tingling and cramping sensations in her legs. No loss of bowel or bladder control is noted.  She is currently managed by a pain management specialist and takes hydrocodone  and gabapentin  for pain relief. She has not had any back injuries or surgeries. An x-ray was ordered but has not been completed due to personal circumstances. She has not undergone any physical therapy for her back pain.  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 She is scheduled for another injection in a couple of weeks. No imaging studies like MRIs have been conducted for her back pain.       Review of Systems     Past Medical History:  Diagnosis Date   Abnormal EKG    CAD (coronary artery disease)    non critical, by cardiac cath 2006   Cancer [facility])    endometrial Ca   COPD (chronic obstructive pulmonary disease) (HCC)    Endometrial adenocarcinoma (HCC) 2011   Hyperlipidemia    Hypertension    Osteoarthritis    Right foot, Left knee (Dr. Daun Epstein)   Reflux gastritis    Thyroid  disease    tobacco abuse    Vaginal delivery    x 2    Current Outpatient Medications  Medication Sig Dispense Refill   albuterol  (PROAIR  HFA) 108 (90 Base) MCG/ACT inhaler Inhale 1-2 puffs into the lungs every 6 (six) hours as needed for wheezing or shortness of breath. 6.7 g 1    aspirin  EC 81 MG tablet Take 1 tablet (81 mg total) by mouth daily. Swallow whole. 30 tablet 12   atorvastatin  (LIPITOR) 10 MG tablet TAKE 1 TABLET BY MOUTH ONCE DAILY 90 tablet 1   esomeprazole  (NEXIUM ) 40 MG capsule Take 1 capsule (40 mg total) by mouth 2 (two) times daily before a meal. 180 capsule 0   famotidine  (PEPCID ) 20 MG tablet Take 1 tablet (20 mg total) by mouth at bedtime. 90 tablet 1   gabapentin  (NEURONTIN ) 300 MG capsule Take 1-2 capsules (300-600 mg total) by mouth at bedtime. 180 capsule 2   HYDROcodone -acetaminophen  (NORCO) 7.5-325 MG tablet Take 1 tablet by mouth every 6 (six) hours as needed for moderate pain (pain score 4-6). 105 tablet 0   [START ON 08/16/2023] HYDROcodone -acetaminophen  (NORCO) 7.5-325 MG tablet Take 1 tablet by mouth every 6 (six) hours as needed for moderate pain (pain score 4-6). 105 tablet 0   [START ON 09/15/2023] HYDROcodone -acetaminophen  (NORCO) 7.5-325 MG tablet Take 1 tablet by mouth every 6 (six) hours as needed for moderate pain (pain score 4-6). 105 tablet 0   levothyroxine  (SYNTHROID ) 125 MCG tablet Take 1 tablet (125 mcg total) by mouth daily before breakfast. 90 tablet 3   linaclotide  (LINZESS ) 145 MCG CAPS capsule Take 1 capsule (145 mcg total) by mouth daily before breakfast. As needed. 30 capsule 2  lisinopril -hydrochlorothiazide  (ZESTORETIC ) 10-12.5 MG tablet Take 1 tablet by mouth daily. 90 tablet 1   ondansetron  (ZOFRAN -ODT) 4 MG disintegrating tablet Take 1 tablet (4 mg total) by mouth daily as needed for nausea or vomiting. 30 tablet 0   polyethylene glycol powder (GLYCOLAX /MIRALAX ) 17 GM/SCOOP powder Take 17 g by mouth daily. 3350 g 1   potassium chloride  SA (KLOR-CON  M) 20 MEQ tablet Take 1 tablet (20 mEq total) by mouth daily. 5 tablet 0   rizatriptan  (MAXALT -MLT) 10 MG disintegrating tablet Take 1 tablet (10 mg total) by mouth as needed for migraine. May repeat in 2 hours if needed 10 tablet 2   No current facility-administered  medications for this visit.    No Known Allergies  Family History  Problem Relation Age of Onset   Alcohol abuse Father    Cancer Maternal Aunt        stomach   Alcohol abuse Maternal Aunt    Alcohol abuse Mother    Heart disease Brother        MI age 66- non smoker   Cancer Brother        Pancreatic   Cancer Brother        colon, dx'd age 53   Cancer Brother        stomach, paternal half brother   Breast cancer Neg Hx     Social History   Socioeconomic History   Marital status: Married    Spouse name: Not on file   Number of children: 0   Years of education: Not on file   Highest education level: Not on file  Occupational History   Occupation: retired   Tobacco Use   Smoking status: Former    Current packs/day: 0.00    Average packs/day: 0.5 packs/day for 50.0 years (25.0 ttl pk-yrs)    Types: Cigarettes    Start date: 05/11/1968    Quit date: 05/11/2018    Years since quitting: 5.2   Smokeless tobacco: Former  Building services engineer status: Never Used  Substance and Sexual Activity   Alcohol use: No   Drug use: No   Sexual activity: Not on file  Other Topics Concern   Not on file  Social History Narrative   Lives with spouse, takes care of her mother.   Had 2 children, one died at birth, the other given up for adoption (was unmarried).   Social Drivers of Corporate investment banker Strain: Low Risk  (04/29/2022)   Overall Financial Resource Strain (CARDIA)    Difficulty of Paying Living Expenses: Not very hard  Food Insecurity: No Food Insecurity (04/29/2022)   Hunger Vital Sign    Worried About Running Out of Food in the Last Year: Never true    Ran Out of Food in the Last Year: Never true  Transportation Needs: No Transportation Needs (04/29/2022)   PRAPARE - Administrator, Civil Service (Medical): No    Lack of Transportation (Non-Medical): No  Physical Activity: Insufficiently Active (04/29/2022)   Exercise Vital Sign    Days of Exercise  per Week: 3 days    Minutes of Exercise per Session: 30 min  Stress: No Stress Concern Present (04/29/2022)   [employer] of Occupational Health - Occupational Stress Questionnaire    Feeling of Stress : Only a little  Social Connections: Moderately Integrated (04/29/2022)   Social Connection and Isolation Panel [NHANES]    Frequency of Communication with Friends and Family: Three times a  week    Frequency of Social Gatherings with Friends and Family: Twice a week    Attends Religious Services: More than 4 times per year    Active Member of [employer] or Organizations: No    Attends Banker Meetings: Never    Marital Status: Married  Catering manager Violence: Not At Risk (04/29/2022)   Humiliation, Afraid, Rape, and Kick questionnaire    Fear of Current or Ex-Partner: No    Emotionally Abused: No    Physically Abused: No    Sexually Abused: No     Constitutional: Patient reports headaches.  Denies fever, malaise, fatigue, or abrupt weight changes.  Respiratory: Denies difficulty breathing, shortness of breath, cough or sputum production.   Cardiovascular: Denies chest pain, chest tightness, palpitations or swelling in the hands or feet.  Gastrointestinal: Patient reports constipation.  Denies loss of bowel control, abdominal pain, bloating, diarrhea or blood in the stool.  GU: Denies loss of bladder control, urgency, frequency, pain with urination, burning sensation, blood in urine, odor or discharge. Musculoskeletal: Patient reports chronic joint muscle pain, back pain.  Denies decrease in range of motion, difficulty with gait, or joint swelling.  Skin: Denies redness, rashes, lesions or ulcercations.  Neurological: Patient reports insomnia, paresthesias of lower extremities.  Denies dizziness, difficulty with memory, difficulty with speech or problems with balance and coordination.    No other specific complaints in a complete review of systems (except as listed in HPI  above).  Objective:   Physical Exam  BP 92/64 (BP Location: Left Arm, Patient Position: Sitting, Cuff Size: Normal)   Ht 5\' 8"  (1.727 m)   Wt 175 lb 6.4 oz (79.6 kg)   BMI 26.67 kg/m     Wt Readings from Last 3 Encounters:  07/13/23 177 lb (80.3 kg)  05/09/23 177 lb (80.3 kg)  04/18/23 184 lb (83.5 kg)    General: Appears her stated age, overweight in NAD. Skin: Warm, dry and intact.  Senile purpura noted. Cardiovascular: Tachycardic with normal rhythm.  Pulmonary/Chest: Normal effort and positive vesicular breath sounds. No respiratory distress. No wheezes, rales or ronchi noted.  Musculoskeletal: Decreased flexion of the lumbar spine.  Normal extension, rotation and lateral bending of the lumbar spine.  Pain with palpation of the lower lumbar spine and bilateral SI joints.  Strength 4/5 BLE.  No difficulty with gait.  Neurological: Alert and oriented. Coordination normal.     BMET    Component Value Date/Time   NA 140 05/09/2023 1459   NA 142 09/01/2022 1017   K 4.0 05/09/2023 1459   CL 102 05/09/2023 1459   CO2 28 05/09/2023 1459   GLUCOSE 69 05/09/2023 1459   BUN 20 05/09/2023 1459   BUN 22 09/01/2022 1017   CREATININE 1.17 (H) 05/09/2023 1459   CALCIUM  10.3 05/09/2023 1459   GFRNONAA >60 04/07/2020 1022   GFRNONAA 52 (L) 01/02/2020 1453   GFRAA 61 01/02/2020 1453    Lipid Panel     Component Value Date/Time   CHOL 189 05/09/2023 1459   TRIG 113 05/09/2023 1459   HDL 57 05/09/2023 1459   CHOLHDL 3.3 05/09/2023 1459   VLDL 42.0 (H) 05/14/2014 0918   LDLCALC 110 (H) 05/09/2023 1459    CBC    Component Value Date/Time   WBC 7.1 05/09/2023 1459   RBC 3.64 (L) 05/09/2023 1459   HGB 10.8 (L) 05/09/2023 1459   HGB 10.1 (L) 09/01/2022 1017   HCT 34.8 (L) 05/09/2023 1459  HCT 31.4 (L) 09/01/2022 1017   PLT 194 05/09/2023 1459   MCV 95.6 05/09/2023 1459   MCV 94 09/01/2022 1017   MCH 29.7 05/09/2023 1459   MCHC 31.0 (L) 05/09/2023 1459   RDW 13.7  05/09/2023 1459   RDW 13.3 09/01/2022 1017   LYMPHSABS 2,125 10/28/2022 1137   LYMPHSABS 1.7 09/01/2022 1017   MONOABS 0.4 09/16/2013 1046   EOSABS 59 10/28/2022 1137   EOSABS 0.1 09/01/2022 1017   BASOSABS 40 10/28/2022 1137   BASOSABS 0.1 09/01/2022 1017    Hgb A1C Lab Results  Component Value Date   HGBA1C 5.1 05/09/2023           Assessment & Plan:   Assessment and Plan    Chronic low back pain with radiculopathy Chronic low back pain with radiculopathy for over ten years, radiating to legs with tingling and cramping. No imaging studies completed. Differential includes degenerative changes. X-ray needed before MRI consideration. - Order x-ray of the lumbar spine. - Consider MRI if x-ray indicates further investigation needed. -Continue hydrocodone  and gabapentin  as previously prescribed - Follow up with pain management specialist, Dr. Rhesa Celeste, for further management options including potential MRI and physical therapy. - Discuss back pain management with Dr. Rhesa Celeste on June 26.      RTC in 3 months for your annual exam. Helayne Lo, NP

## 2023-08-08 NOTE — Patient Instructions (Signed)

## 2023-08-24 ENCOUNTER — Telehealth: Payer: Self-pay | Admitting: Internal Medicine

## 2023-08-24 NOTE — Telephone Encounter (Signed)
 Called 08/24/2023 to sched AWV - NO VOICEMAIL  Pamela Romero; Care Guide Ambulatory Clinical Support [place] [facility] Direct Dial: [phone]

## 2023-09-23 ENCOUNTER — Other Ambulatory Visit: Payer: Self-pay | Admitting: Internal Medicine

## 2023-09-26 NOTE — Telephone Encounter (Signed)
 Requested Prescriptions  Pending Prescriptions Disp Refills   lisinopril -hydrochlorothiazide  (ZESTORETIC ) 10-12.5 MG tablet [Pharmacy Med Name: LISINOPRIL -HCTZ 10-12.5 MG TAB] 90 tablet 0    Sig: TAKE 1 TABLET BY MOUTH ONCE DAILY *DOSE DECREASE     Cardiovascular:  ACEI + Diuretic Combos Failed - 09/26/2023 11:59 AM      Failed - Cr in normal range and within 180 days    Creat  Date Value Ref Range Status  05/09/2023 1.17 (H) 0.60 - 1.00 mg/dL Final         Passed - Na in normal range and within 180 days    Sodium  Date Value Ref Range Status  05/09/2023 140 135 - 146 mmol/L Final  09/01/2022 142 134 - 144 mmol/L Final         Passed - K in normal range and within 180 days    Potassium  Date Value Ref Range Status  05/09/2023 4.0 3.5 - 5.3 mmol/L Final         Passed - eGFR is 30 or above and within 180 days    GFR, Est African American  Date Value Ref Range Status  01/02/2020 61 > OR = 60 mL/min/1.54m2 Final   GFR, Est Non African American  Date Value Ref Range Status  01/02/2020 52 (L) > OR = 60 mL/min/1.69m2 Final   GFR, Estimated  Date Value Ref Range Status  04/07/2020 >60 >60 mL/min Final    Comment:    (NOTE) Calculated using the CKD-EPI Creatinine Equation (2021)    GFR  Date Value Ref Range Status  01/21/2015 60.34 >60.00 mL/min Final   eGFR  Date Value Ref Range Status  05/09/2023 50 (L) > OR = 60 mL/min/1.71m2 Final  09/01/2022 52 (L) >59 mL/min/1.73 Final         Passed - Patient is not pregnant      Passed - Last BP in normal range    BP Readings from Last 1 Encounters:  08/08/23 92/64         Passed - Valid encounter within last 6 months    Recent Outpatient Visits           1 month ago Spinal stenosis of lumbar region without neurogenic claudication   [place] [facility] [place], Rankin Buzzard, NP       Future Appointments             In 1 month [place], Rankin Buzzard, NP [facility], [facility]

## 2023-10-05 ENCOUNTER — Encounter: Admitting: Nurse Practitioner

## 2023-10-12 ENCOUNTER — Ambulatory Visit: Attending: Nurse Practitioner | Admitting: Nurse Practitioner

## 2023-10-12 ENCOUNTER — Encounter: Payer: Self-pay | Admitting: Nurse Practitioner

## 2023-10-12 VITALS — BP 107/81 | HR 87 | Temp 97.8°F | Resp 18 | Ht 68.0 in | Wt 175.0 lb

## 2023-10-12 DIAGNOSIS — K5903 Drug induced constipation: Secondary | ICD-10-CM | POA: Insufficient documentation

## 2023-10-12 DIAGNOSIS — T402X5A Adverse effect of other opioids, initial encounter: Secondary | ICD-10-CM | POA: Diagnosis present

## 2023-10-12 DIAGNOSIS — M48062 Spinal stenosis, lumbar region with neurogenic claudication: Secondary | ICD-10-CM | POA: Insufficient documentation

## 2023-10-12 DIAGNOSIS — G894 Chronic pain syndrome: Secondary | ICD-10-CM | POA: Diagnosis present

## 2023-10-12 DIAGNOSIS — M170 Bilateral primary osteoarthritis of knee: Secondary | ICD-10-CM | POA: Insufficient documentation

## 2023-10-12 DIAGNOSIS — M810 Age-related osteoporosis without current pathological fracture: Secondary | ICD-10-CM | POA: Diagnosis present

## 2023-10-12 MED ORDER — HYDROCODONE-ACETAMINOPHEN 7.5-325 MG PO TABS: 1.0000 | ORAL_TABLET | Freq: Four times a day (QID) | ORAL | 0 refills | Status: DC | PRN

## 2023-10-12 MED ORDER — HYDROCODONE-ACETAMINOPHEN 7.5-325 MG PO TABS: 1.0000 | ORAL_TABLET | Freq: Four times a day (QID) | ORAL | 0 refills | Status: AC | PRN

## 2023-10-12 NOTE — Progress Notes (Signed)
 PROVIDER NOTE: Interpretation of information contained herein should be left to medically-trained personnel. Specific patient instructions are provided elsewhere under Patient Instructions section of medical record. This document was created in part using AI and STT-dictation technology, any transcriptional errors that may result from this process are unintentional.  Patient: Pamela Romero  Service: E/M   PCP: Antonette Angeline ORN, NP  DOB: March 23, 1951  DOS: 10/12/2023  Provider: Emmy MARLA Blanch, NP  MRN: [id number]  Delivery: Face-to-face  Specialty: Interventional Pain Management  Type: Established Patient  Setting: Ambulatory outpatient facility  Specialty designation: 09  Referring Prov.: Antonette Angeline ORN, NP  Location: Outpatient office facility       History of present illness (HPI) Pamela Romero, a 73 y.o. year old female, is here today because of her Chronic pain syndrome [G89.4]. Pamela Romero primary complain today is Knee Pain (Left worse than right )  Pertinent problems: Pamela Romero has Bilateral primary osteoarthritis of knee and Osteoporosis, and chronic pain syndrome on their pertinent problem list.  Pain Assessment: Severity of Chronic pain is reported as a 6 /10. Location: Knee Left/Radiates down left leg. Onset: More than a month ago. Quality: Aching, Burning, Constant, Stabbing. Timing: Constant. Modifying factor(s): Medication. Vitals:  height is 5' 8 (1.727 m) and weight is 175 lb (79.4 kg). Her temperature is 97.8 F (36.6 C). Her blood pressure is 107/81 and her pulse is 87. Her respiration is 18 and oxygen saturation is 100%.  BMI: Estimated body mass index is 26.61 kg/m as calculated from the following:   Height as of this encounter: 5' 8 (1.727 m).   Weight as of this encounter: 175 lb (79.4 kg).  Last encounter: 07/13/2023 Last procedure: 02/15/2023  Reason for encounter: medication management. No change in medical history since last visit.  Patient's pain is at  baseline.  Patient continues multimodal pain regimen as prescribed.  States that it provides pain relief and improvement in functional status.  Pharmacotherapy Assessment    Hydrocodone -acetaminophen  (NORCO) 7.5-325 mg every 6 hours as needed for moderate pain (pain score 4-6); MME = 26.25  Monitoring: [place] PMP: PDMP reviewed during this encounter.       Pharmacotherapy: No side-effects or adverse reactions reported. Compliance: No problems identified. Effectiveness: Clinically acceptable.  Erlene Doyal SAUN[place], [place]  10/12/2023 10:39 AM  Sign when Signing Visit Nursing Pain Medication Assessment:  Safety precautions to be maintained throughout the outpatient stay will include: orient to surroundings, keep bed in low position, maintain call bell within reach at all times, provide assistance with transfer out of bed and ambulation.  Medication Inspection Compliance: Pill count conducted under aseptic conditions, in front of the patient. Neither the pills nor the bottle was removed from the patient's sight at any time. Once count was completed pills were immediately returned to the patient in their original bottle.  Medication: Hydrocodone /APAP Pill/Patch Count: 4 of 105 pills/patches remain Pill/Patch Appearance: Markings consistent with prescribed medication Bottle Appearance: Standard pharmacy container. Clearly labeled. Filled Date: 6 / 6 / 2025 Last Medication intake:  Today    UDS:  Summary  Date Value Ref Range Status  10/12/2022 Note  Final    Comment:    ==================================================================== [employer] Select 13 (MW) ==================================================================== Test                             Result       Flag       Units  Drug Present and Declared for Prescription Verification   Hydrocodone                     4019         EXPECTED   ng/mg creat   Hydromorphone                  840          EXPECTED   ng/mg creat   Dihydrocodeine                  793          EXPECTED   ng/mg creat   Norhydrocodone                 3106         EXPECTED   ng/mg creat    Sources of hydrocodone  include scheduled prescription medications.    Hydromorphone, dihydrocodeine and norhydrocodone are expected    metabolites of hydrocodone . Hydromorphone and dihydrocodeine are    also available as scheduled prescription medications.  ==================================================================== Test                      Result    Flag   Units      Ref Range   Creatinine              113              mg/dL      >=79 ==================================================================== Declared Medications:  The flagging and interpretation on this report are based on the  following declared medications.  Unexpected results may arise from  inaccuracies in the declared medications.   **Note: The testing scope of this panel includes these medications:   Hydrocodone  (Norco)   **Note: The testing scope of this panel does not include the  following reported medications:   Acetaminophen  (Norco)  Albuterol  (Ventolin  HFA)  Aspirin   Atorvastatin  (Lipitor)  Docusate (Colace)  Esomeprazole  (Nexium )  Famotidine  (Pepcid )  Gabapentin  (Neurontin )  Hydrochlorothiazide  (Zestoretic )  Levothyroxine  (Synthroid )  Lisinopril  (Zestoretic )  Ondansetron  (Zofran )  Polyethylene Glycol (GlycoLax )  Rizatriptan  (Maxalt )  Sertraline  (Zoloft ) ==================================================================== For clinical consultation, please call [phone]. ====================================================================     No results found for: CBDTHCR No results found for: D8THCCBX No results found for: D9THCCBX  ROS  Constitutional: Denies any fever or chills Gastrointestinal: No reported hemesis, hematochezia, vomiting, or acute GI distress Musculoskeletal: Bilateral knee pain (L>R) Neurological: No reported episodes of acute  onset apraxia, aphasia, dysarthria, agnosia, amnesia, paralysis, loss of coordination, or loss of consciousness  Medication Review  HYDROcodone -acetaminophen , albuterol , aspirin  EC, atorvastatin , esomeprazole , famotidine , gabapentin , levothyroxine , linaclotide , lisinopril -hydrochlorothiazide , ondansetron , polyethylene glycol powder, potassium chloride  SA, and rizatriptan   History Review  Allergy: Pamela Romero has no known allergies. Drug: Pamela Romero  reports no history of drug use. Alcohol:  reports no history of alcohol use. Tobacco:  reports that she quit smoking about 5 years ago. Her smoking use included cigarettes. She started smoking about 55 years ago. She has a 25 pack-year smoking history. She has quit using smokeless tobacco. Social: Pamela Romero  reports that she quit smoking about 5 years ago. Her smoking use included cigarettes. She started smoking about 55 years ago. She has a 25 pack-year smoking history. She has quit using smokeless tobacco. She reports that she does not drink alcohol and does not use drugs. Medical:  has a past medical history of Abnormal EKG, CAD (coronary artery disease), Cancer (HCC),  COPD (chronic obstructive pulmonary disease) (HCC), Endometrial adenocarcinoma (HCC) (2011), Hyperlipidemia, Hypertension, Osteoarthritis, Reflux gastritis, Thyroid  disease, tobacco abuse, and Vaginal delivery. Surgical: Pamela Romero  has a past surgical history that includes Thyroidectomy (2005); Cardiac catheterization (2006); Total abdominal hysterectomy; Breast surgery (Right, March 2014); Breast cyst aspiration (Right, 2014); Esophagogastroduodenoscopy (N/A, 04/07/2020); Esophagogastroduodenoscopy (egd) with propofol  (N/A, 11/07/2022); biopsy (11/07/2022); Balloon dilation (11/07/2022); and polypectomy (11/07/2022). Family: family history includes Alcohol abuse in her father, maternal aunt, and mother; Cancer in her brother, brother, brother, and maternal aunt; Heart disease in her  brother.  Laboratory Chemistry Profile   Renal Lab Results  Component Value Date   BUN 20 05/09/2023   CREATININE 1.17 (H) 05/09/2023   BCR 17 05/09/2023   GFR 60.34 01/21/2015   GFRAA 61 01/02/2020   GFRNONAA >60 04/07/2020    Hepatic Lab Results  Component Value Date   AST 11 05/09/2023   ALT 6 05/09/2023   ALBUMIN 3.8 (L) 09/01/2022   ALKPHOS 77 09/01/2022   HCVAB NEGATIVE 01/21/2015   LIPASE 13 (L) 09/01/2022    Electrolytes Lab Results  Component Value Date   NA 140 05/09/2023   K 4.0 05/09/2023   CL 102 05/09/2023   CALCIUM  10.3 05/09/2023   MG 1.3 (L) 09/15/2021    Bone Lab Results  Component Value Date   VD25OH 18 (L) 12/09/2021    Inflammation (CRP: Acute Phase) (ESR: Chronic Phase) Lab Results  Component Value Date   ESRSEDRATE 19 03/30/2012         Note: Above Lab results reviewed.  Recent Imaging Review  DG Lumbar Spine Complete CLINICAL DATA:  Chronic low back pain for 4 years.  EXAM: LUMBAR SPINE - COMPLETE 4+ VIEW  COMPARISON:  None Available.  FINDINGS: There is no evidence of lumbar spine fracture. Alignment is normal. Mild-to-moderate narrow intervertebral space at L2-3. Mild narrow intervertebral spaces at L1-2, L5-S1. Mild facet joint sclerosis is noted at L4-5 and L5-S1.  IMPRESSION: Degenerative joint changes of lumbar spine.  Electronically Signed   By: Craig Farr M.D.   On: 08/10/2023 15:03 Note: Reviewed        Physical Exam  Vitals: BP 107/81   Pulse 87   Temp 97.8 F (36.6 C)   Resp 18   Ht 5' 8 (1.727 m)   Wt 175 lb (79.4 kg)   SpO2 100%   BMI 26.61 kg/m  BMI: Estimated body mass index is 26.61 kg/m as calculated from the following:   Height as of this encounter: 5' 8 (1.727 m).   Weight as of this encounter: 175 lb (79.4 kg). Ideal: Ideal body weight: 63.9 kg (140 lb 14 oz) Adjusted ideal body weight: 70.1 kg (154 lb 8.4 oz) General appearance: Well nourished, well developed, and well hydrated. In  no apparent acute distress Mental status: Alert, oriented x 3 (person, place, & time)       Respiratory: No evidence of acute respiratory distress Eyes: PERLA  + left knee pain., worse with WB  Assessment   Diagnosis Status  1. Chronic pain syndrome   2. Bilateral primary osteoarthritis of knee   3. Spinal stenosis, lumbar region, with neurogenic claudication   4. Age-related osteoporosis without current pathological fracture   5. Therapeutic opioid induced constipation    Controlled Having a Flare-up Controlled   Updated Problems: No problems updated.  Plan of Care  Problem-specific:  Assessment and Plan We will continue on current medication regimen.  Prescribing drug monitoring (PDMP) reviewed; findings  consistent with the use of prescribed medication and no evidence of narcotic misuse or abuse.  No other new issues or problems reported to this visit. Continue with hydrocodone -acetaminophen  (NORCO) 7.5-325 mg every 6 hours as needed for moderate pain.   UDS collect on 10/16/2023   Pamela Romero has a current medication list which includes the following long-term medication(s): albuterol , atorvastatin , esomeprazole , famotidine , gabapentin , levothyroxine , linaclotide , lisinopril -hydrochlorothiazide , potassium chloride  sa, and rizatriptan .  Pharmacotherapy (Medications Ordered): Meds ordered this encounter  Medications   HYDROcodone -acetaminophen  (NORCO) 7.5-325 MG tablet    Sig: Take 1 tablet by mouth every 6 (six) hours as needed for moderate pain (pain score 4-6).    Dispense:  120 tablet    Refill:  0    May fill one day earlier   HYDROcodone -acetaminophen  (NORCO) 7.5-325 MG tablet    Sig: Take 1 tablet by mouth every 6 (six) hours as needed for moderate pain (pain score 4-6).    Dispense:  120 tablet    Refill:  0   HYDROcodone -acetaminophen  (NORCO) 7.5-325 MG tablet    Sig: Take 1 tablet by mouth every 6 (six) hours as needed for moderate pain (pain score 4-6).     Dispense:  120 tablet    Refill:  0   Orders:  Orders Placed This Encounter  Procedures   T[employer] Select 13 (MW), Urine    Volume: 30 ml(s). Minimum 3 ml of urine is needed. Document temperature of fresh sample. Indications: Long term (current) use of opiate analgesic (S20.108)    Release to patient:   Immediate     MM - Hydrocodone -acetaminophen  (NORCO) 7.5-325 mg every 6 hours as needed for moderate pain (pain score 4-6)    Return in about 3 months (around 01/12/2024) for (F2F), (MM), Emmy Blanch NP.    Recent Visits No visits were found meeting these conditions. Showing recent visits within past 90 days and meeting all other requirements Today's Visits Date Type Provider Dept  10/12/23 Office Visit Laymond Postle K, NP [facility]  Showing today's visits and meeting all other requirements Future Appointments Date Type Provider Dept  10/16/23 Appointment Marcelino Nurse, MD [facility]  01/10/24 Appointment Olukemi Panchal K, NP [facility]  Showing future appointments within next 90 days and meeting all other requirements  I discussed the assessment and treatment plan with the patient. The patient was provided an opportunity to ask questions and all were answered. The patient agreed with the plan and demonstrated an understanding of the instructions.  Patient advised to call back or seek an in-person evaluation if the symptoms or condition worsens.  Duration of encounter: 30 minutes.  Total time on encounter, as per AMA guidelines included both the face-to-face and non-face-to-face time personally spent by the physician and/or other qualified health care professional(s) on the day of the encounter (includes time in activities that require the physician or other qualified health care professional and does not include time in activities normally performed by clinical staff). Physician's time may include the following activities when  performed: Preparing to see the patient (e.g., pre-charting review of records, searching for previously ordered imaging, lab work, and nerve conduction tests) Review of prior analgesic pharmacotherapies. Reviewing PMP Interpreting ordered tests (e.g., lab work, imaging, nerve conduction tests) Performing post-procedure evaluations, including interpretation of diagnostic procedures Obtaining and/or reviewing separately obtained history Performing a medically appropriate examination and/or evaluation Counseling and educating the patient/family/caregiver Ordering medications, tests, or procedures Referring and communicating with other health  care professionals (when not separately reported) Documenting clinical information in the electronic or other health record Independently interpreting results (not separately reported) and communicating results to the patient/ family/caregiver Care coordination (not separately reported)  Note by: Kahliya Fraleigh K Amariana Mirando, NP (TTS and AI technology used. I apologize for any typographical errors that were not detected and corrected.) Date: 10/12/2023; Time: 11:15 AM

## 2023-10-12 NOTE — Progress Notes (Signed)
 Nursing Pain Medication Assessment:  Safety precautions to be maintained throughout the outpatient stay will include: orient to surroundings, keep bed in low position, maintain call bell within reach at all times, provide assistance with transfer out of bed and ambulation.  Medication Inspection Compliance: Pill count conducted under aseptic conditions, in front of the patient. Neither the pills nor the bottle was removed from the patient's sight at any time. Once count was completed pills were immediately returned to the patient in their original bottle.  Medication: Hydrocodone /APAP Pill/Patch Count: 4 of 105 pills/patches remain Pill/Patch Appearance: Markings consistent with prescribed medication Bottle Appearance: Standard pharmacy container. Clearly labeled. Filled Date: 6 / 6 / 2025 Last Medication intake:  Today

## 2023-10-16 ENCOUNTER — Ambulatory Visit: Admitting: Student in an Organized Health Care Education/Training Program

## 2023-10-19 LAB — TOXASSURE SELECT 13 (MW), URINE

## 2023-10-27 ENCOUNTER — Ambulatory Visit: Payer: Self-pay | Admitting: Internal Medicine

## 2023-10-27 NOTE — Progress Notes (Deleted)
 Subjective:    Patient ID: Pamela Romero, female    DOB: 1950/09/19, 73 y.o.   MRN: [id number]  HPI  Patient presents to clinic today for her annual exam.   Flu: 04/2023 Tetanus: 01/2012 COVID: X 2 Pneumovax: 09/2019 Prevnar 13: 01/2015 Prevnar 20: 09/2021 Shingrix: Never Pap smear: Hysterectomy Mammogram: 11/2019 Bone density: 11/2019 Colon screening: Unsure Vision screening: Annually Dentist: Valetta  Diet: She does not eat much meat.  She tries to consume some fruits and vegetables.  She is not eating any fried foods.  She drinks mostly water. Exercise: None   Review of Systems     Past Medical History:  Diagnosis Date   Abnormal EKG    CAD (coronary artery disease)    non critical, by cardiac cath 2006   Cancer [facility])    endometrial Ca   COPD (chronic obstructive pulmonary disease) (HCC)    Endometrial adenocarcinoma (HCC) 2011   Hyperlipidemia    Hypertension    Osteoarthritis    Right foot, Left knee (Dr. Edie)   Reflux gastritis    Thyroid  disease    tobacco abuse    Vaginal delivery    x 2    Current Outpatient Medications  Medication Sig Dispense Refill   albuterol  (PROAIR  HFA) 108 (90 Base) MCG/ACT inhaler Inhale 1-2 puffs into the lungs every 6 (six) hours as needed for wheezing or shortness of breath. 6.7 g 1   aspirin  EC 81 MG tablet Take 1 tablet (81 mg total) by mouth daily. Swallow whole. 30 tablet 12   atorvastatin  (LIPITOR) 10 MG tablet TAKE 1 TABLET BY MOUTH ONCE DAILY 90 tablet 1   esomeprazole  (NEXIUM ) 40 MG capsule Take 1 capsule (40 mg total) by mouth 2 (two) times daily before a meal. 180 capsule 0   famotidine  (PEPCID ) 20 MG tablet Take 1 tablet (20 mg total) by mouth at bedtime. 90 tablet 1   gabapentin  (NEURONTIN ) 300 MG capsule Take 1-2 capsules (300-600 mg total) by mouth at bedtime. 180 capsule 2   HYDROcodone -acetaminophen  (NORCO) 7.5-325 MG tablet Take 1 tablet by mouth every 6 (six) hours as needed for moderate pain (pain  score 4-6). 120 tablet 0   [START ON 11/14/2023] HYDROcodone -acetaminophen  (NORCO) 7.5-325 MG tablet Take 1 tablet by mouth every 6 (six) hours as needed for moderate pain (pain score 4-6). 120 tablet 0   [START ON 12/14/2023] HYDROcodone -acetaminophen  (NORCO) 7.5-325 MG tablet Take 1 tablet by mouth every 6 (six) hours as needed for moderate pain (pain score 4-6). 120 tablet 0   levothyroxine  (SYNTHROID ) 125 MCG tablet Take 1 tablet (125 mcg total) by mouth daily before breakfast. 90 tablet 3   linaclotide  (LINZESS ) 145 MCG CAPS capsule Take 1 capsule (145 mcg total) by mouth daily before breakfast. As needed. 30 capsule 2   lisinopril -hydrochlorothiazide  (ZESTORETIC ) 10-12.5 MG tablet TAKE 1 TABLET BY MOUTH ONCE DAILY *DOSE DECREASE 90 tablet 0   ondansetron  (ZOFRAN -ODT) 4 MG disintegrating tablet Take 1 tablet (4 mg total) by mouth daily as needed for nausea or vomiting. 30 tablet 0   polyethylene glycol powder (GLYCOLAX /MIRALAX ) 17 GM/SCOOP powder Take 17 g by mouth daily. 3350 g 1   potassium chloride  SA (KLOR-CON  M) 20 MEQ tablet Take 1 tablet (20 mEq total) by mouth daily. 5 tablet 0   rizatriptan  (MAXALT -MLT) 10 MG disintegrating tablet Take 1 tablet (10 mg total) by mouth as needed for migraine. May repeat in 2 hours if needed 10 tablet 2   No  current facility-administered medications for this visit.    No Known Allergies  Family History  Problem Relation Age of Onset   Alcohol abuse Father    Cancer Maternal Aunt        stomach   Alcohol abuse Maternal Aunt    Alcohol abuse Mother    Heart disease Brother        MI age 26- non smoker   Cancer Brother        Pancreatic   Cancer Brother        colon, dx'd age 45   Cancer Brother        stomach, paternal half brother   Breast cancer Neg Hx     Social History   Socioeconomic History   Marital status: Married    Spouse name: Not on file   Number of children: 0   Years of education: Not on file   Highest education level: Not  on file  Occupational History   Occupation: retired   Tobacco Use   Smoking status: Former    Current packs/day: 0.00    Average packs/day: 0.5 packs/day for 50.0 years (25.0 ttl pk-yrs)    Types: Cigarettes    Start date: 05/11/1968    Quit date: 05/11/2018    Years since quitting: 5.4   Smokeless tobacco: Former  Building services engineer status: Never Used  Substance and Sexual Activity   Alcohol use: No   Drug use: No   Sexual activity: Not on file  Other Topics Concern   Not on file  Social History Narrative   Lives with spouse, takes care of her mother.   Had 2 children, one died at birth, the other given up for adoption (was unmarried).   Social Drivers of Corporate investment banker Strain: Low Risk  (04/29/2022)   Overall Financial Resource Strain (CARDIA)    Difficulty of Paying Living Expenses: Not very hard  Food Insecurity: No Food Insecurity (04/29/2022)   Hunger Vital Sign    Worried About Running Out of Food in the Last Year: Never true    Ran Out of Food in the Last Year: Never true  Transportation Needs: No Transportation Needs (04/29/2022)   PRAPARE - Administrator, Civil Service (Medical): No    Lack of Transportation (Non-Medical): No  Physical Activity: Insufficiently Active (04/29/2022)   Exercise Vital Sign    Days of Exercise per Week: 3 days    Minutes of Exercise per Session: 30 min  Stress: No Stress Concern Present (04/29/2022)   [employer] of Occupational Health - Occupational Stress Questionnaire    Feeling of Stress : Only a little  Social Connections: Moderately Integrated (04/29/2022)   Social Connection and Isolation Panel    Frequency of Communication with Friends and Family: Three times a week    Frequency of Social Gatherings with Friends and Family: Twice a week    Attends Religious Services: More than 4 times per year    Active Member of [employer] or Organizations: No    Attends Banker Meetings: Never    Marital  Status: Married  Catering manager Violence: Not At Risk (04/29/2022)   Humiliation, Afraid, Rape, and Kick questionnaire    Fear of Current or Ex-Partner: No    Emotionally Abused: No    Physically Abused: No    Sexually Abused: No     Constitutional: Patient reports intermittent headaches.  Denies fever, malaise, fatigue or abrupt weight changes.  HEENT: Denies eye pain, eye redness, ear pain, ringing in the ears, wax buildup, runny nose, nasal congestion, bloody nose, or sore throat. Respiratory: Denies difficulty breathing, shortness of breath, cough or sputum production.   Cardiovascular: Denies chest pain, chest tightness, palpitations or swelling in the hands or feet.  Gastrointestinal: Patient reports constipation.  Denies abdominal pain, bloating, diarrhea or blood in the stool.  GU: Denies urgency, frequency, pain with urination, burning sensation, blood in urine, odor or discharge. Musculoskeletal: Patient reports chronic joint pain.  Denies decrease in range of motion, muscle pain or joint swelling.  Skin: Denies redness, rashes, lesions or ulcercations.  Neurological: Patient reports insomnia, intermittent dizziness.  Denies difficulty with memory, difficulty with speech or problems with balance and coordination.  Psych: Patient has a history of anxiety and depression.  Denies SI/HI.  No other specific complaints in a complete review of systems (except as listed in HPI above).  Objective:   Physical Exam  There were no vitals taken for this visit.  Wt Readings from Last 3 Encounters:  10/12/23 175 lb (79.4 kg)  08/08/23 175 lb 6.4 oz (79.6 kg)  07/13/23 177 lb (80.3 kg)    General: Appears her stated age, chronically ill-appearing.  She does appear acutely unwell but has been adamant that she is not going to the hospital. Skin: Warm, dry and intact.  Pallor noted. HEENT: Head: normal shape and size; Eyes: sclera white, no icterus, conjunctiva pink, PERRLA and EOMs  intact;  Neck:  Neck supple, trachea midline. No masses, lumps present.  Cardiovascular: Tachycardic with normal rhythm. S1,S2 noted.  No murmur, rubs or gallops noted. No JVD or BLE edema. No carotid bruits noted. Pulmonary/Chest: Normal effort and positive vesicular breath sounds. No respiratory distress. No wheezes, rales or ronchi noted.  Abdomen: Soft and generally tender. Normal bowel sounds. No distention or masses noted. Liver, spleen and kidneys non palpable. Musculoskeletal: Pain with palpation of BLE. In wheelchair, but able to stand with assistance.  No difficulty with gait.  Neurological: Alert and oriented. Cranial nerves II-XII grossly intact. Coordination normal.  Psychiatric: She is extremely anxious appearing. Judgment and thought content normal.     BMET    Component Value Date/Time   NA 140 05/09/2023 1459   NA 142 09/01/2022 1017   K 4.0 05/09/2023 1459   CL 102 05/09/2023 1459   CO2 28 05/09/2023 1459   GLUCOSE 69 05/09/2023 1459   BUN 20 05/09/2023 1459   BUN 22 09/01/2022 1017   CREATININE 1.17 (H) 05/09/2023 1459   CALCIUM  10.3 05/09/2023 1459   GFRNONAA >60 04/07/2020 1022   GFRNONAA 52 (L) 01/02/2020 1453   GFRAA 61 01/02/2020 1453    Lipid Panel     Component Value Date/Time   CHOL 189 05/09/2023 1459   TRIG 113 05/09/2023 1459   HDL 57 05/09/2023 1459   CHOLHDL 3.3 05/09/2023 1459   VLDL 42.0 (H) 05/14/2014 0918   LDLCALC 110 (H) 05/09/2023 1459    CBC    Component Value Date/Time   WBC 7.1 05/09/2023 1459   RBC 3.64 (L) 05/09/2023 1459   HGB 10.8 (L) 05/09/2023 1459   HGB 10.1 (L) 09/01/2022 1017   HCT 34.8 (L) 05/09/2023 1459   HCT 31.4 (L) 09/01/2022 1017   PLT 194 05/09/2023 1459   MCV 95.6 05/09/2023 1459   MCV 94 09/01/2022 1017   MCH 29.7 05/09/2023 1459   MCHC 31.0 (L) 05/09/2023 1459   RDW 13.7 05/09/2023 1459  RDW 13.3 09/01/2022 1017   LYMPHSABS 2,125 10/28/2022 1137   LYMPHSABS 1.7 09/01/2022 1017   MONOABS 0.4  09/16/2013 1046   EOSABS 59 10/28/2022 1137   EOSABS 0.1 09/01/2022 1017   BASOSABS 40 10/28/2022 1137   BASOSABS 0.1 09/01/2022 1017    Hgb A1C Lab Results  Component Value Date   HGBA1C 5.1 05/09/2023           Assessment & Plan:   Preventative health maintenance:  Encouraged her to get a flu shot in the fall She declines tetanus for financial reasons, advised her if she gets bit or cut to go get this done Pneumonia vaccines UTD Encouraged her to get her COVID booster Discussed Shingrix vaccine, she will check coverage with her insurance company and schedule visit if she would like to have this done She no longer needs to screen for cervical cancer Mammogram and bone density ordered-she will call to schedule Colonoscopy Encouraged her to consume a balanced diet and exercise regimen Advised her to see an eye doctor and dentist annually We will check CBC, c-Met, TSH, free T4, lipid, A1c today   RTC in 6 months, follow-up chronic conditions Angeline Laura, NP

## 2023-10-30 ENCOUNTER — Ambulatory Visit: Admitting: Student in an Organized Health Care Education/Training Program

## 2023-11-06 ENCOUNTER — Ambulatory Visit: Payer: Self-pay

## 2023-11-06 NOTE — Telephone Encounter (Signed)
 Will discuss at upcoming appointment

## 2023-11-06 NOTE — Telephone Encounter (Signed)
 FYI Only or Action Required?: FYI only for provider.  Patient was last seen in primary care on 08/08/2023 by Antonette Angeline ORN, NP.  Called Nurse Triage reporting Abdominal Pain.  Symptoms began several weeks ago.  Interventions attempted: OTC medications: laxatives.  Symptoms are: gradually worsening.  Triage Disposition: See Physician Within 24 Hours  Patient/caregiver understands and will follow disposition?: Yes, will follow disposition  Copied from CRM [id number]. Topic: Clinical - Red Word Triage >> Nov 06, 2023  1:42 PM Elle L wrote: Red Word that prompted transfer to Nurse Triage: The patient's husband states that the patient is vomiting every time she eats and she is experiencing severe stomach pain and she is concerned that she has stomach cancer. Reason for Disposition  [1] MODERATE pain (e.g., interferes with normal activities) AND [2] pain comes and goes (cramps) AND [3] present > 24 hours  (Exception: Pain with Vomiting or Diarrhea - see that Guideline.)  Answer Assessment - Initial Assessment Questions 1. LOCATION: Where does it hurt?      Every time pt eats she has stomach pain, lower abd 2. RADIATION: Does the pain shoot anywhere else? (e.g., chest, back)     denies 3. ONSET: When did the pain begin? (e.g., minutes, hours or days ago)      Long time 4. SUDDEN: Gradual or sudden onset?     gradually 5. PATTERN Does the pain come and go, or is it constant?     Intermittent with eating 6. SEVERITY: How bad is the pain?  (e.g., Scale 1-10; mild, moderate, or severe)     7 7. RECURRENT SYMPTOM: Have you ever had this type of stomach pain before? If Yes, ask: When was the last time? and What happened that time?      Pt was told it was constipation in the past 8. CAUSE: What do you think is causing the stomach pain? (e.g., gallstones, recent abdominal surgery)     Pt believes she has stomach cancer 9. RELIEVING/AGGRAVATING FACTORS: What makes it  better or worse? (e.g., antacids, bending or twisting motion, bowel movement)     Eating makes it worse 10. OTHER SYMPTOMS: Do you have any other symptoms? (e.g., back pain, diarrhea, fever, urination pain, vomiting)       + Nausea  Protocols used: Abdominal Pain - Female-A-AH

## 2023-11-07 ENCOUNTER — Ambulatory Visit: Admitting: Internal Medicine

## 2023-11-08 ENCOUNTER — Ambulatory Visit: Admitting: Student in an Organized Health Care Education/Training Program

## 2023-11-13 ENCOUNTER — Ambulatory Visit: Admitting: Internal Medicine

## 2023-11-13 ENCOUNTER — Telehealth: Payer: Self-pay | Admitting: Student in an Organized Health Care Education/Training Program

## 2023-11-13 NOTE — Progress Notes (Deleted)
 Subjective:    Patient ID: Pamela Romero, female    DOB: 25-Jan-1951, 73 y.o.   MRN: [id number]  HPI  Patient presents to clinic today for her annual exam.  She reports she has not been doing well lately.  About 2 to 3 months ago she had a severe GI illness that consisted of severe abdominal pain nausea and vomiting.  After that, she reports poor appetite for 10 days.  Since that time she reports daily nausea with vomiting about every 3 to 4 days.  She has chronic abdominal pain but attributes this to chronic constipation.  She reports about every 2 weeks she will disimpact herself.  She reports she has started taking MiraLAX  OTC but often throws this up.  She generally feels weak and is unable to do things that she used to be able to do.  She has an upper endoscopy schedule for 11/07/22  Flu: 03/2022 Tetanus: 01/2012 COVID: X 2 Pneumovax: 09/2019 Prevnar 13: 01/2015 Prevnar 20: 09/2021 Shingrix: Never Pap smear: Hysterectomy Mammogram: 11/2019 Bone density: 11/2019 Colon screening: Unsure Vision screening: Annually Dentist: Biannually  Diet: She does not eat much meat.  She tries to consume some fruits and vegetables.  She is not eating any fried foods.  She drinks mostly water. Exercise: None   Review of Systems     Past Medical History:  Diagnosis Date   Abnormal EKG    CAD (coronary artery disease)    non critical, by cardiac cath 2006   Cancer [facility])    endometrial Ca   COPD (chronic obstructive pulmonary disease) (HCC)    Endometrial adenocarcinoma (HCC) 2011   Hyperlipidemia    Hypertension    Osteoarthritis    Right foot, Left knee (Dr. Edie)   Reflux gastritis    Thyroid  disease    tobacco abuse    Vaginal delivery    x 2    Current Outpatient Medications  Medication Sig Dispense Refill   albuterol  (PROAIR  HFA) 108 (90 Base) MCG/ACT inhaler Inhale 1-2 puffs into the lungs every 6 (six) hours as needed for wheezing or shortness of breath. 6.7 g 1   aspirin   EC 81 MG tablet Take 1 tablet (81 mg total) by mouth daily. Swallow whole. 30 tablet 12   atorvastatin  (LIPITOR) 10 MG tablet TAKE 1 TABLET BY MOUTH ONCE DAILY 90 tablet 1   esomeprazole  (NEXIUM ) 40 MG capsule Take 1 capsule (40 mg total) by mouth 2 (two) times daily before a meal. 180 capsule 0   famotidine  (PEPCID ) 20 MG tablet Take 1 tablet (20 mg total) by mouth at bedtime. 90 tablet 1   gabapentin  (NEURONTIN ) 300 MG capsule Take 1-2 capsules (300-600 mg total) by mouth at bedtime. 180 capsule 2   HYDROcodone -acetaminophen  (NORCO) 7.5-325 MG tablet Take 1 tablet by mouth every 6 (six) hours as needed for moderate pain (pain score 4-6). 120 tablet 0   [START ON 11/14/2023] HYDROcodone -acetaminophen  (NORCO) 7.5-325 MG tablet Take 1 tablet by mouth every 6 (six) hours as needed for moderate pain (pain score 4-6). 120 tablet 0   [START ON 12/14/2023] HYDROcodone -acetaminophen  (NORCO) 7.5-325 MG tablet Take 1 tablet by mouth every 6 (six) hours as needed for moderate pain (pain score 4-6). 120 tablet 0   levothyroxine  (SYNTHROID ) 125 MCG tablet Take 1 tablet (125 mcg total) by mouth daily before breakfast. 90 tablet 3   linaclotide  (LINZESS ) 145 MCG CAPS capsule Take 1 capsule (145 mcg total) by mouth daily before breakfast. As  needed. 30 capsule 2   lisinopril -hydrochlorothiazide  (ZESTORETIC ) 10-12.5 MG tablet TAKE 1 TABLET BY MOUTH ONCE DAILY *DOSE DECREASE 90 tablet 0   ondansetron  (ZOFRAN -ODT) 4 MG disintegrating tablet Take 1 tablet (4 mg total) by mouth daily as needed for nausea or vomiting. 30 tablet 0   polyethylene glycol powder (GLYCOLAX /MIRALAX ) 17 GM/SCOOP powder Take 17 g by mouth daily. 3350 g 1   potassium chloride  SA (KLOR-CON  M) 20 MEQ tablet Take 1 tablet (20 mEq total) by mouth daily. 5 tablet 0   rizatriptan  (MAXALT -MLT) 10 MG disintegrating tablet Take 1 tablet (10 mg total) by mouth as needed for migraine. May repeat in 2 hours if needed 10 tablet 2   No current  facility-administered medications for this visit.    No Known Allergies  Family History  Problem Relation Age of Onset   Alcohol abuse Father    Cancer Maternal Aunt        stomach   Alcohol abuse Maternal Aunt    Alcohol abuse Mother    Heart disease Brother        MI age 75- non smoker   Cancer Brother        Pancreatic   Cancer Brother        colon, dx'd age 73   Cancer Brother        stomach, paternal half brother   Breast cancer Neg Hx     Social History   Socioeconomic History   Marital status: Married    Spouse name: Not on file   Number of children: 0   Years of education: Not on file   Highest education level: Not on file  Occupational History   Occupation: retired   Tobacco Use   Smoking status: Former    Current packs/day: 0.00    Average packs/day: 0.5 packs/day for 50.0 years (25.0 ttl pk-yrs)    Types: Cigarettes    Start date: 05/11/1968    Quit date: 05/11/2018    Years since quitting: 5.5   Smokeless tobacco: Former  Building services engineer status: Never Used  Substance and Sexual Activity   Alcohol use: No   Drug use: No   Sexual activity: Not on file  Other Topics Concern   Not on file  Social History Narrative   Lives with spouse, takes care of her mother.   Had 2 children, one died at birth, the other given up for adoption (was unmarried).   Social Drivers of Corporate investment banker Strain: Low Risk  (04/29/2022)   Overall Financial Resource Strain (CARDIA)    Difficulty of Paying Living Expenses: Not very hard  Food Insecurity: No Food Insecurity (04/29/2022)   Hunger Vital Sign    Worried About Running Out of Food in the Last Year: Never true    Ran Out of Food in the Last Year: Never true  Transportation Needs: No Transportation Needs (04/29/2022)   PRAPARE - Administrator, Civil Service (Medical): No    Lack of Transportation (Non-Medical): No  Physical Activity: Insufficiently Active (04/29/2022)   Exercise Vital Sign     Days of Exercise per Week: 3 days    Minutes of Exercise per Session: 30 min  Stress: No Stress Concern Present (04/29/2022)   [employer] of Occupational Health - Occupational Stress Questionnaire    Feeling of Stress : Only a little  Social Connections: Moderately Integrated (04/29/2022)   Social Connection and Isolation Panel    Frequency of  Communication with Friends and Family: Three times a week    Frequency of Social Gatherings with Friends and Family: Twice a week    Attends Religious Services: More than 4 times per year    Active Member of [employer] or Organizations: No    Attends Banker Meetings: Never    Marital Status: Married  Catering manager Violence: Not At Risk (04/29/2022)   Humiliation, Afraid, Rape, and Kick questionnaire    Fear of Current or Ex-Partner: No    Emotionally Abused: No    Physically Abused: No    Sexually Abused: No     Constitutional: Patient reports intermittent headaches, fatigue, unintentional weight loss..  Denies fever, malaise, fatigue.  HEENT: Denies eye pain, eye redness, ear pain, ringing in the ears, wax buildup, runny nose, nasal congestion, bloody nose, or sore throat. Respiratory: Denies difficulty breathing, shortness of breath, cough or sputum production.   Cardiovascular: Denies chest pain, chest tightness, palpitations or swelling in the hands or feet.  Gastrointestinal: Patient reports nausea and vomiting, constipation.  Denies abdominal pain, bloating, diarrhea or blood in the stool.  GU: Denies urgency, frequency, pain with urination, burning sensation, blood in urine, odor or discharge. Musculoskeletal: Patient reports chronic joint pain, generalized weakness.  Denies decrease in range of motion, muscle pain or joint swelling.  Skin: Denies redness, rashes, lesions or ulcercations.  Neurological: Patient reports insomnia, intermittent dizziness.  Denies difficulty with memory, difficulty with speech or problems  with balance and coordination.  Psych: Patient has a history of anxiety and depression.  Denies SI/HI.  No other specific complaints in a complete review of systems (except as listed in HPI above).  Objective:   Physical Exam  There were no vitals taken for this visit.  Wt Readings from Last 3 Encounters:  10/12/23 175 lb (79.4 kg)  08/08/23 175 lb 6.4 oz (79.6 kg)  07/13/23 177 lb (80.3 kg)    General: Appears her stated age, chronically ill-appearing.  She does appear acutely unwell but has been adamant that she is not going to the hospital. Skin: Warm, dry and intact.  Pallor noted. HEENT: Head: normal shape and size; Eyes: sclera white, no icterus, conjunctiva pink, PERRLA and EOMs intact;  Neck:  Neck supple, trachea midline. No masses, lumps present.  Cardiovascular: Tachycardic with normal rhythm. S1,S2 noted.  No murmur, rubs or gallops noted. No JVD or BLE edema. No carotid bruits noted. Pulmonary/Chest: Normal effort and positive vesicular breath sounds. No respiratory distress. No wheezes, rales or ronchi noted.  Abdomen: Soft and generally tender. Normal bowel sounds. No distention or masses noted. Liver, spleen and kidneys non palpable. Musculoskeletal: Pain with palpation of BLE. In wheelchair, but able to stand with assistance.  No difficulty with gait.  Neurological: Alert and oriented. Cranial nerves II-XII grossly intact. Coordination normal.  Psychiatric: She is extremely anxious appearing. Judgment and thought content normal.     BMET    Component Value Date/Time   NA 140 05/09/2023 1459   NA 142 09/01/2022 1017   K 4.0 05/09/2023 1459   CL 102 05/09/2023 1459   CO2 28 05/09/2023 1459   GLUCOSE 69 05/09/2023 1459   BUN 20 05/09/2023 1459   BUN 22 09/01/2022 1017   CREATININE 1.17 (H) 05/09/2023 1459   CALCIUM  10.3 05/09/2023 1459   GFRNONAA >60 04/07/2020 1022   GFRNONAA 52 (L) 01/02/2020 1453   GFRAA 61 01/02/2020 1453    Lipid Panel     Component  Value  Date/Time   CHOL 189 05/09/2023 1459   TRIG 113 05/09/2023 1459   HDL 57 05/09/2023 1459   CHOLHDL 3.3 05/09/2023 1459   VLDL 42.0 (H) 05/14/2014 0918   LDLCALC 110 (H) 05/09/2023 1459    CBC    Component Value Date/Time   WBC 7.1 05/09/2023 1459   RBC 3.64 (L) 05/09/2023 1459   HGB 10.8 (L) 05/09/2023 1459   HGB 10.1 (L) 09/01/2022 1017   HCT 34.8 (L) 05/09/2023 1459   HCT 31.4 (L) 09/01/2022 1017   PLT 194 05/09/2023 1459   MCV 95.6 05/09/2023 1459   MCV 94 09/01/2022 1017   MCH 29.7 05/09/2023 1459   MCHC 31.0 (L) 05/09/2023 1459   RDW 13.7 05/09/2023 1459   RDW 13.3 09/01/2022 1017   LYMPHSABS 2,125 10/28/2022 1137   LYMPHSABS 1.7 09/01/2022 1017   MONOABS 0.4 09/16/2013 1046   EOSABS 59 10/28/2022 1137   EOSABS 0.1 09/01/2022 1017   BASOSABS 40 10/28/2022 1137   BASOSABS 0.1 09/01/2022 1017    Hgb A1C Lab Results  Component Value Date   HGBA1C 5.1 05/09/2023           Assessment & Plan:   Preventative health maintenance:  Encouraged her to get a flu shot in the fall She declines tetanus for financial reasons, advised her if she gets bit or cut to go get this done Pneumonia vaccines UTD Encouraged her to get her COVID booster Discussed Shingrix vaccine, she will check coverage with her insurance company and schedule visit if she would like to have this done She no longer needs to screen for cervical cancer Mammogram and bone density ordered- she will call to schedule Colon screening Encouraged her to consume a balanced diet and exercise regimen Advised her to see an eye doctor and dentist annually We will check CBC, c-Met, TSH, free T4, lipid, A1c today  Generalized abdominal pain, nausea, vomiting, chronic constipation, unintentional weight loss:  Will check stat CBC and c-Met today Will obtain stat CT abdomen/pelvis contrast Advised her that she appears unwell and that my thought is is that she needs to go to the ER for further evaluation  however she would like to try to get blood results and CT abdomen/pelvis results prior to making that decision ER precautions discussed   RTC in 6 months, follow-up chronic conditions Angeline Laura, NP

## 2023-11-13 NOTE — Telephone Encounter (Signed)
 Patient has been fighting stomach issues and has rescheduled or missed procedure appt x3. Do you want me to reschedule her once more ?

## 2023-11-17 ENCOUNTER — Encounter: Payer: Self-pay | Admitting: Internal Medicine

## 2023-11-17 ENCOUNTER — Ambulatory Visit: Admitting: Internal Medicine

## 2023-11-17 VITALS — BP 100/72 | Ht 68.0 in | Wt 182.0 lb

## 2023-11-17 DIAGNOSIS — K2100 Gastro-esophageal reflux disease with esophagitis, without bleeding: Secondary | ICD-10-CM

## 2023-11-17 DIAGNOSIS — K5903 Drug induced constipation: Secondary | ICD-10-CM | POA: Diagnosis not present

## 2023-11-17 DIAGNOSIS — F439 Reaction to severe stress, unspecified: Secondary | ICD-10-CM | POA: Diagnosis not present

## 2023-11-17 DIAGNOSIS — T402X5A Adverse effect of other opioids, initial encounter: Secondary | ICD-10-CM

## 2023-11-17 MED ORDER — LINACLOTIDE 145 MCG PO CAPS: 145.0000 ug | ORAL_CAPSULE | Freq: Every day | ORAL | 1 refills | Status: AC

## 2023-11-17 NOTE — Progress Notes (Signed)
 Subjective:    Patient ID: Pamela Romero, female    DOB: 1951-01-18, 73 y.o.   MRN: [id number]  HPI  Discussed the use of AI scribe software for clinical note transcription with the patient, who gave verbal consent to proceed.  Pamela Romero is a 73 year old female with chronic constipation who presents with ongoing gastrointestinal issues.  She has chronic constipation, describing it as 'still terrible.' She misunderstood the dosing instructions for Linzess  145 mcg, taking it once a week instead of daily, resulting in irregular bowel movements. She resorts to using a laxative when necessary.  An upper endoscopy was performed in July 2024, but she declined a colonoscopy due to the prep involved. She is currently taking Nexium  40 mg and Pepcid  20 mg. Stress significantly impacts her gastrointestinal symptoms. She has reduced her Pepsi intake from six a day to one or two a month and now drinks sugar-free Kool-Aid mixed with water.  She notes that during a recent trip out of town, she was able to eat without issues, but her symptoms resumed upon returning. She attributes some stress to family dynamics, particularly involving her partner's mother, which has been emotionally taxing for her.  No vomiting or nausea during the recent trip, but she experiences these symptoms when constipated.       Review of Systems     Past Medical History:  Diagnosis Date   Abnormal EKG    CAD (coronary artery disease)    non critical, by cardiac cath 2006   Cancer [facility])    endometrial Ca   COPD (chronic obstructive pulmonary disease) (HCC)    Endometrial adenocarcinoma (HCC) 2011   Hyperlipidemia    Hypertension    Osteoarthritis    Right foot, Left knee (Dr. Edie)   Reflux gastritis    Thyroid  disease    tobacco abuse    Vaginal delivery    x 2    Current Outpatient Medications  Medication Sig Dispense Refill   albuterol  (PROAIR  HFA) 108 (90 Base) MCG/ACT inhaler Inhale 1-2 puffs into  the lungs every 6 (six) hours as needed for wheezing or shortness of breath. 6.7 g 1   aspirin  EC 81 MG tablet Take 1 tablet (81 mg total) by mouth daily. Swallow whole. 30 tablet 12   atorvastatin  (LIPITOR) 10 MG tablet TAKE 1 TABLET BY MOUTH ONCE DAILY 90 tablet 1   esomeprazole  (NEXIUM ) 40 MG capsule Take 1 capsule (40 mg total) by mouth 2 (two) times daily before a meal. 180 capsule 0   famotidine  (PEPCID ) 20 MG tablet Take 1 tablet (20 mg total) by mouth at bedtime. 90 tablet 1   gabapentin  (NEURONTIN ) 300 MG capsule Take 1-2 capsules (300-600 mg total) by mouth at bedtime. 180 capsule 2   HYDROcodone -acetaminophen  (NORCO) 7.5-325 MG tablet Take 1 tablet by mouth every 6 (six) hours as needed for moderate pain (pain score 4-6). 120 tablet 0   [START ON 12/14/2023] HYDROcodone -acetaminophen  (NORCO) 7.5-325 MG tablet Take 1 tablet by mouth every 6 (six) hours as needed for moderate pain (pain score 4-6). 120 tablet 0   levothyroxine  (SYNTHROID ) 125 MCG tablet Take 1 tablet (125 mcg total) by mouth daily before breakfast. 90 tablet 3   linaclotide  (LINZESS ) 145 MCG CAPS capsule Take 1 capsule (145 mcg total) by mouth daily before breakfast. As needed. 30 capsule 2   lisinopril -hydrochlorothiazide  (ZESTORETIC ) 10-12.5 MG tablet TAKE 1 TABLET BY MOUTH ONCE DAILY *DOSE DECREASE 90 tablet 0   ondansetron  (  ZOFRAN -ODT) 4 MG disintegrating tablet Take 1 tablet (4 mg total) by mouth daily as needed for nausea or vomiting. 30 tablet 0   polyethylene glycol powder (GLYCOLAX /MIRALAX ) 17 GM/SCOOP powder Take 17 g by mouth daily. 3350 g 1   potassium chloride  SA (KLOR-CON  M) 20 MEQ tablet Take 1 tablet (20 mEq total) by mouth daily. 5 tablet 0   rizatriptan  (MAXALT -MLT) 10 MG disintegrating tablet Take 1 tablet (10 mg total) by mouth as needed for migraine. May repeat in 2 hours if needed 10 tablet 2   No current facility-administered medications for this visit.    No Known Allergies  Family History   Problem Relation Age of Onset   Alcohol abuse Father    Cancer Maternal Aunt        stomach   Alcohol abuse Maternal Aunt    Alcohol abuse Mother    Heart disease Brother        MI age 77- non smoker   Cancer Brother        Pancreatic   Cancer Brother        colon, dx'd age 80   Cancer Brother        stomach, paternal half brother   Breast cancer Neg Hx     Social History   Socioeconomic History   Marital status: Married    Spouse name: Not on file   Number of children: 0   Years of education: Not on file   Highest education level: Not on file  Occupational History   Occupation: retired   Tobacco Use   Smoking status: Former    Current packs/day: 0.00    Average packs/day: 0.5 packs/day for 50.0 years (25.0 ttl pk-yrs)    Types: Cigarettes    Start date: 05/11/1968    Quit date: 05/11/2018    Years since quitting: 5.5   Smokeless tobacco: Former  Building services engineer status: Never Used  Substance and Sexual Activity   Alcohol use: No   Drug use: No   Sexual activity: Not on file  Other Topics Concern   Not on file  Social History Narrative   Lives with spouse, takes care of her mother.   Had 2 children, one died at birth, the other given up for adoption (was unmarried).   Social Drivers of Corporate investment banker Strain: Low Risk  (04/29/2022)   Overall Financial Resource Strain (CARDIA)    Difficulty of Paying Living Expenses: Not very hard  Food Insecurity: No Food Insecurity (04/29/2022)   Hunger Vital Sign    Worried About Running Out of Food in the Last Year: Never true    Ran Out of Food in the Last Year: Never true  Transportation Needs: No Transportation Needs (04/29/2022)   PRAPARE - Administrator, Civil Service (Medical): No    Lack of Transportation (Non-Medical): No  Physical Activity: Insufficiently Active (04/29/2022)   Exercise Vital Sign    Days of Exercise per Week: 3 days    Minutes of Exercise per Session: 30 min  Stress:  No Stress Concern Present (04/29/2022)   [employer] of Occupational Health - Occupational Stress Questionnaire    Feeling of Stress : Only a little  Social Connections: Moderately Integrated (04/29/2022)   Social Connection and Isolation Panel    Frequency of Communication with Friends and Family: Three times a week    Frequency of Social Gatherings with Friends and Family: Twice a week  Attends Religious Services: More than 4 times per year    Active Member of Clubs or Organizations: No    Attends Banker Meetings: Never    Marital Status: Married  Catering manager Violence: Not At Risk (04/29/2022)   Humiliation, Afraid, Rape, and Kick questionnaire    Fear of Current or Ex-Partner: No    Emotionally Abused: No    Physically Abused: No    Sexually Abused: No     Constitutional: Patient reports intermittent headaches, fatigue, unintentional weight loss..  Denies fever, malaise, fatigue.  Respiratory: Denies difficulty breathing, shortness of breath, cough or sputum production.   Cardiovascular: Denies chest pain, chest tightness, palpitations or swelling in the hands or feet.  Gastrointestinal: Patient reports nausea and vomiting, constipation.  Denies abdominal pain, bloating, diarrhea or blood in the stool.  GU: Denies urgency, frequency, pain with urination, burning sensation, blood in urine, odor or discharge. Neurological: Patient reports insomnia, intermittent dizziness.  Denies difficulty with memory, difficulty with speech or problems with balance and coordination.  Psych: Patient has a history of anxiety and depression, reports recent stress.  Denies SI/HI.  No other specific complaints in a complete review of systems (except as listed in HPI above).  Objective:   Physical Exam  BP 100/72 (BP Location: Left Arm, Patient Position: Sitting, Cuff Size: Normal)   Ht 5' 8 (1.727 m)   Wt 182 lb (82.6 kg)   BMI 27.67 kg/m    Wt Readings from Last 3  Encounters:  10/12/23 175 lb (79.4 kg)  08/08/23 175 lb 6.4 oz (79.6 kg)  07/13/23 177 lb (80.3 kg)    General: Appears her stated age, chronically ill-appearing.   Cardiovascular: Tachycardic with normal rhythm. S1,S2 noted.  No murmur, rubs or gallops noted.  Pulmonary/Chest: Normal effort and positive vesicular breath sounds. No respiratory distress. No wheezes, rales or ronchi noted.  Abdomen: Soft and generally tender.  Hypoactive bowel sounds, dull to percussion. No distention or masses noted.  Neurological: Alert and oriented.    BMET    Component Value Date/Time   NA 140 05/09/2023 1459   NA 142 09/01/2022 1017   K 4.0 05/09/2023 1459   CL 102 05/09/2023 1459   CO2 28 05/09/2023 1459   GLUCOSE 69 05/09/2023 1459   BUN 20 05/09/2023 1459   BUN 22 09/01/2022 1017   CREATININE 1.17 (H) 05/09/2023 1459   CALCIUM  10.3 05/09/2023 1459   GFRNONAA >60 04/07/2020 1022   GFRNONAA 52 (L) 01/02/2020 1453   GFRAA 61 01/02/2020 1453    Lipid Panel     Component Value Date/Time   CHOL 189 05/09/2023 1459   TRIG 113 05/09/2023 1459   HDL 57 05/09/2023 1459   CHOLHDL 3.3 05/09/2023 1459   VLDL 42.0 (H) 05/14/2014 0918   LDLCALC 110 (H) 05/09/2023 1459    CBC    Component Value Date/Time   WBC 7.1 05/09/2023 1459   RBC 3.64 (L) 05/09/2023 1459   HGB 10.8 (L) 05/09/2023 1459   HGB 10.1 (L) 09/01/2022 1017   HCT 34.8 (L) 05/09/2023 1459   HCT 31.4 (L) 09/01/2022 1017   PLT 194 05/09/2023 1459   MCV 95.6 05/09/2023 1459   MCV 94 09/01/2022 1017   MCH 29.7 05/09/2023 1459   MCHC 31.0 (L) 05/09/2023 1459   RDW 13.7 05/09/2023 1459   RDW 13.3 09/01/2022 1017   LYMPHSABS 2,125 10/28/2022 1137   LYMPHSABS 1.7 09/01/2022 1017   MONOABS 0.4 09/16/2013 1046  EOSABS 59 10/28/2022 1137   EOSABS 0.1 09/01/2022 1017   BASOSABS 40 10/28/2022 1137   BASOSABS 0.1 09/01/2022 1017    Hgb A1C Lab Results  Component Value Date   HGBA1C 5.1 05/09/2023            Assessment & Plan:   Assessment and Plan    Chronic constipation with associated nausea Chronic constipation with nausea due to incorrect Linzess  usage, stress, and inadequate water intake. Previous reluctance for colonoscopy due to prep-related nausea. - Refilled Linzess , instructed to take 145 mcg daily in the morning. - Encouraged increased water intake. - Discussed potential colonoscopy with alternative prep options if symptoms persist.  Gastroesophageal reflux disease (GERD) GERD with Nexium  40 mg and Pepcid  20 mg.   Schedule an appointment for your annual exam Angeline Laura, NP

## 2023-11-17 NOTE — Patient Instructions (Signed)

## 2023-11-22 ENCOUNTER — Ambulatory Visit
Attending: Student in an Organized Health Care Education/Training Program | Admitting: Student in an Organized Health Care Education/Training Program

## 2023-11-22 ENCOUNTER — Encounter: Payer: Self-pay | Admitting: Student in an Organized Health Care Education/Training Program

## 2023-11-22 VITALS — BP 101/69 | HR 108 | Resp 18 | Ht 68.5 in | Wt 184.0 lb

## 2023-11-22 DIAGNOSIS — G894 Chronic pain syndrome: Secondary | ICD-10-CM | POA: Diagnosis not present

## 2023-11-22 DIAGNOSIS — M170 Bilateral primary osteoarthritis of knee: Secondary | ICD-10-CM | POA: Diagnosis not present

## 2023-11-22 MED ORDER — TRIAMCINOLONE ACETONIDE 32 MG IX SRER
32.0000 mg | Freq: Once | INTRA_ARTICULAR | Status: AC
  Administered 2023-11-22 (×2): 32 mg via INTRA_ARTICULAR
  Filled 2023-11-22: qty 5

## 2023-11-22 MED ORDER — LIDOCAINE HCL (PF) 2 % IJ SOLN
5.0000 mL | Freq: Once | INTRAMUSCULAR | Status: AC
  Administered 2023-11-22 (×2): 5 mL
  Filled 2023-11-22: qty 5

## 2023-11-22 NOTE — Progress Notes (Signed)
 Safety precautions to be maintained throughout the outpatient stay will include: orient to surroundings, keep bed in low position, maintain call bell within reach at all times, provide assistance with transfer out of bed and ambulation.

## 2023-11-22 NOTE — Progress Notes (Signed)
 PROVIDER NOTE: Interpretation of information contained herein should be left to medically-trained personnel. Specific patient instructions are provided elsewhere under Patient Instructions section of medical record. This document was created in part using STT-dictation technology, any transcriptional errors that may result from this process are unintentional.  Patient: Pamela Romero Type: Established DOB: [birth year] MRN: [id number] PCP: Antonette Angeline ORN, NP  Service: Procedure DOS: 11/22/2023 Setting: Ambulatory Location: Ambulatory outpatient facility Delivery: Face-to-face Provider: Wallie Sherry, MD Specialty: Interventional Pain Management Specialty designation: 09 Location: Outpatient facility Ref. Prov.: Antonette Angeline ORN, NP   Procedure [facility] Interventional Pain Management )    Procedure: Steroid Knee Injection (LONG ACTING Kennalog)  Laterality: Left (-LT) Level: Intra-articular  No.: L8 Series: n/a Purpose: Therapeutic Indications: Knee arthralgia associated to osteoarthritis of the knee   Imaging: None required (CPT-20610) Analgesia: Skin infiltration w/ local anesthetics Sedation: None  NAS-11 score:   Pre-procedure: 7 /10   Post-procedure: 0-No pain/10      1. Bilateral primary osteoarthritis of knee   2. Chronic pain syndrome     Pre-Procedure Preparation  Monitoring: As per clinic protocol.  Risk Assessment: Vitals:  AFP:Zdupfjuzi body mass index is 27.57 kg/m as calculated from the following:   Height as of this encounter: 5' 8.5 (1.74 m).   Weight as of this encounter: 184 lb (83.5 kg)., Rate:(!) 108 , BP:101/69, Resp:18, Temp: , SpO2:99 %  Allergies: She has no known allergies.  Precautions: None required  Blood-thinner(s): None at this time  Coagulopathies: Reviewed. None identified.   Active Infection(s): Reviewed. None identified. Pamela Romero is afebrile   Location setting: Exam room Position: Sitting w/ knee bent 90 degrees Safety Precautions:  Patient was assessed for positional comfort and pressure points before starting the procedure. Prepping solution: DuraPrep (Iodine Povacrylex [0.7% available iodine] and Isopropyl Alcohol, 74% w/w) Prep Area: Entire knee region Approach: percutaneous, just above the tibial plateau, medial to the infrapatellar tendon. Intended target: Intra-articular knee space Materials: Tray: Block Needle(s): Regular Qty: 1/side Length: 1.5-inch Gauge: 25G   Meds ordered this encounter  Medications   Triamcinolone  Acetonide (ZILRETTA ) intra-articular injection 32 mg    Maintain refrigerated.  Prepared suspension may be stored up to 4 hours at ambient conditions.   lidocaine  HCl (PF) (XYLOCAINE ) 2 % injection 5 mL     No orders of the defined types were placed in this encounter.     Time-out: 1008 I initiated and conducted the Time-out before starting the procedure, as per protocol. The patient was asked to participate by confirming the accuracy of the Time Out information. Verification of the correct person, site, and procedure were performed and confirmed by me, the nursing staff, and the patient. Time-out conducted as per Joint Commission's Universal Protocol (UP.01.01.01). Procedure checklist: Completed   H&P (Pre-op  Assessment)  Pamela Romero is a 73 y.o. (year old), female patient, seen today for interventional treatment. She  has a past surgical history that includes Thyroidectomy (2005); Cardiac catheterization (2006); Total abdominal hysterectomy; Breast surgery (Right, March 2014); Breast cyst aspiration (Right, 2014); Esophagogastroduodenoscopy (N/A, 04/07/2020); Esophagogastroduodenoscopy (egd) with propofol  (N/A, 11/07/2022); biopsy (11/07/2022); Balloon dilation (11/07/2022); and polypectomy (11/07/2022). Pamela Romero has a current medication list which includes the following prescription(s): albuterol , aspirin  ec, atorvastatin , esomeprazole , famotidine , gabapentin ,  hydrocodone -acetaminophen , [START ON 12/14/2023] hydrocodone -acetaminophen , levothyroxine , linaclotide , lisinopril -hydrochlorothiazide , ondansetron , polyethylene glycol powder, potassium chloride  sa, and rizatriptan . Her primarily concern today is the Knee Pain (Left, Radiates down to foot and toes/)   She has no known allergies.  Last encounter: My last encounter with her was on 11/03/2020. Pertinent problems: Pamela Romero has Bilateral primary osteoarthritis of knee and Osteoporosis on their pertinent problem list. Pain Assessment: Severity of Chronic pain is reported as a 7 /10. Location: Knee Left/Radaites down left leg to foot and toes. Onset: More than a month ago. Quality: Aching, Burning, Constant, Stabbing. Timing: Intermittent. Modifying factor(s): Medication. Vitals:  height is 5' 8.5 (1.74 m) and weight is 184 lb (83.5 kg). Her blood pressure is 101/69 and her pulse is 108 (abnormal). Her respiration is 18 and oxygen saturation is 99%.   Reason for encounter: interventional pain management therapy due pain of at least four (4) weeks in duration, with failure to respond and/or inability to tolerate more conservative care.     Related imaging: Knee-L MR wo contrast:  Results for orders placed during the hospital encounter of 09/01/17  MR KNEE LEFT WO CONTRAST  Narrative CLINICAL DATA:  Persistent anterior knee pain for 1.5 months.  EXAM: MRI OF THE LEFT KNEE WITHOUT CONTRAST  TECHNIQUE: Multiplanar, multisequence MR imaging of the knee was performed. No intravenous contrast was administered.  COMPARISON:  None.  FINDINGS: MENISCI  Medial meniscus: There is an extensive horizontal tear posterior horn extending from the superior surface to the periphery. The midbody is degenerated and peripherally subluxed.  Lateral meniscus: Extensive horizontal tear involving the anterior horn and midbody with small parameniscal cysts at the level of the midbody best seen on image 34  of series 7 and series 31 of series 5.  LIGAMENTS  Cruciates:  Intact.  Collaterals:  Intact.  CARTILAGE  Patellofemoral: Partial-thickness cartilage loss of the superior aspect of the medial facet of the patella.  Medial: Denuding of the articular cartilage of the medial tibial plateau with subcortical edema and subcortical cyst formation in the periphery of the tibial plateau. Thinning of the articular cartilage of the femoral condyle.  Lateral: Focal full-thickness cartilage loss in the posterior aspect of the lateral tibial plateau.  Joint: Trace joint effusion. Normal Hoffa's fat pad. No plical thickening.  Popliteal Fossa:  Small Baker's cyst.  Intact popliteus tendon.  Extensor Mechanism:  Normal.  Bones: Moderate marginal osteophytes in the medial compartment. Tiny marginal osteophytes in the lateral compartment.  Other: None  IMPRESSION: 1. Extensive horizontal tear of the posterior horn of the medial meniscus. 2. Extensive horizontal tear of the anterior horn and midbody of the lateral meniscus. 3. Moderate osteoarthritis of the medial compartment with full-thickness cartilage loss and subcortical edema and subcortical cyst formation in the medial tibial plateau.   Electronically Signed By: Lynwood Hugger M.D. On: 09/01/2017 10:59 \    Site Confirmation: Ms. Kellogg was asked to confirm the procedure and laterality before marking the site.  Consent: Before the procedure and under the influence of no sedative(s), amnesic(s), or anxiolytics, the patient was informed of the treatment options, risks and possible complications. To fulfill our ethical and legal obligations, as recommended by the American Medical Association's Code of Ethics, I have informed the patient of my clinical impression; the nature and purpose of the treatment or procedure; the risks, benefits, and possible complications of the intervention; the alternatives, including doing nothing;  the risk(s) and benefit(s) of the alternative treatment(s) or procedure(s); and the risk(s) and benefit(s) of doing nothing. The patient was provided information about the general risks and possible complications associated with the procedure. These may include, but are not limited to: failure to achieve desired goals, infection, bleeding, organ or  nerve damage, allergic reactions, paralysis, and death. In addition, the patient was informed of those risks and complications associated to Spine-related procedures, such as failure to decrease pain; infection (i.e.: Meningitis, epidural or intraspinal abscess); bleeding (i.e.: epidural hematoma, subarachnoid hemorrhage, or any other type of intraspinal or peri-dural bleeding); organ or nerve damage (i.e.: Any type of peripheral nerve, nerve root, or spinal cord injury) with subsequent damage to sensory, motor, and/or autonomic systems, resulting in permanent pain, numbness, and/or weakness of one or several areas of the body; allergic reactions; (i.e.: anaphylactic reaction); and/or death. Furthermore, the patient was informed of those risks and complications associated with the medications. These include, but are not limited to: allergic reactions (i.e.: anaphylactic or anaphylactoid reaction(s)); adrenal axis suppression; blood sugar elevation that in diabetics may result in ketoacidosis or comma; water retention that in patients with history of congestive heart failure may result in shortness of breath, pulmonary edema, and decompensation with resultant heart failure; weight gain; swelling or edema; medication-induced neural toxicity; particulate matter embolism and blood vessel occlusion with resultant organ, and/or nervous system infarction; and/or aseptic necrosis of one or more joints. Finally, the patient was informed that Medicine is not an exact science; therefore, there is also the possibility of unforeseen or unpredictable risks and/or possible  complications that may result in a catastrophic outcome. The patient indicated having understood very clearly. We have given the patient no guarantees and we have made no promises. Enough time was given to the patient to ask questions, all of which were answered to the patient's satisfaction. Ms. Nolting has indicated that she wanted to continue with the procedure. Attestation: I, the ordering provider, attest that I have discussed with the patient the benefits, risks, side-effects, alternatives, likelihood of achieving goals, and potential problems during recovery for the procedure that I have provided informed consent.  Date  Time: 11/22/2023  9:24 AM   Prophylactic antibiotics  Anti-infectives (From admission, onward)    None      Indication(s): None identified   Description of procedure   Start Time: 1008 hrs  Local Anesthesia: Once the patient was positioned, prepped, and time-out was completed. The target area was identified located. The skin was marked with an approved surgical skin marker. Once marked, the skin (epidermis, dermis, and hypodermis), and deeper tissues (fat, connective tissue and muscle) were infiltrated with a small amount of a short-acting local anesthetic, loaded on a 10cc syringe with a 25G, 1.5-in  Needle. An appropriate amount of time was allowed for local anesthetics to take effect before proceeding to the next step. Local Anesthetic: Lidocaine  1-2% The unused portion of the local anesthetic was discarded in the proper designated containers. Safety Precautions: Aspiration looking for blood return was conducted prior to all injections. At no point did I inject any substances, as a needle was being advanced. Before injecting, the patient was told to immediately notify me if she was experiencing any new onset of ringing in the ears, or metallic taste in the mouth. No attempts were made at seeking any paresthesias. Safe injection practices and needle disposal  techniques used. Medications properly checked for expiration dates. SDV (single dose vial) medications used. After the completion of the procedure, all disposable equipment used was discarded in the proper designated medical waste containers.  Technical description: Protocol guidelines were followed. After positioning, the target area was identified and prepped in the usual manner. Skin & deeper tissues infiltrated with local anesthetic. Appropriate amount of time allowed to pass for local  anesthetics to take effect. Proper needle placement secured. Once satisfactory needle placement was confirmed, I proceeded to inject the desired solution in slow, incremental fashion, intermittently assessing for discomfort or any signs of abnormal or undesired spread of substance. Once completed, the needle was removed and disposed of, as per hospital protocols. The area was cleaned, making sure to leave some of the prepping solution back to take advantage of its long term bactericidal properties.  Aspiration:  Negative   Vitals:   11/22/23 0927  BP: 101/69  Pulse: (!) 108  Resp: 18  SpO2: 99%  Weight: 184 lb (83.5 kg)  Height: 5' 8.5 (1.74 m)      End Time: 1012 hrs     Post-op assessment  Post-procedure Vital Signs:  Pulse/HCG Rate: (!) 108  Temp:    Resp: 18 BP: 101/69 SpO2: 99 %  EBL: None  Complications: No immediate post-treatment complications observed by team, or reported by patient.  Note: The patient tolerated the entire procedure well. A repeat set of vitals were taken after the procedure and the patient was kept under observation following institutional policy, for this type of procedure. Post-procedural neurological assessment was performed, showing return to baseline, prior to discharge. The patient was provided with post-procedure discharge instructions, including a section on how to identify potential problems. Should any problems arise concerning this procedure, the patient was  given instructions to immediately contact us , at any time, without hesitation. In any case, we plan to contact the patient by telephone for a follow-up status report regarding this interventional procedure.  Comments:  No additional relevant information.   Plan of care    Requested Prescriptions    No prescriptions requested or ordered in this encounter      Medications administered: We administered Triamcinolone  Acetonide and lidocaine  HCl (PF).  5 cc of ER triamcinolone  injected in each knee.  Follow-up plan:   Return for Keep sch. appt.     Recent Visits Date Type Provider Dept  10/12/23 Office Visit Patel, Seema K, NP [facility]  Showing recent visits within past 90 days and meeting all other requirements Today's Visits Date Type Provider Dept  11/22/23 Procedure visit Marcelino Nurse, MD [facility]  Showing today's visits and meeting all other requirements Future Appointments Date Type Provider Dept  01/10/24 Appointment Patel, Seema K, NP [facility]  Showing future appointments within next 90 days and meeting all other requirements   Disposition: Discharge home  Discharge (Date  Time): 11/22/2023; 1012 hrs.   Primary Care Physician: Antonette Angeline ORN, NP Location: [facility] Note by: Nurse Marcelino, MD Date: 11/22/2023; Time: 10:17 AM  DISCLAIMER: Medicine is not an exact science. It has no guarantees or warranties. The decision to proceed with this intervention was based on the information collected from the patient. Conclusions were drawn from the patient's questionnaire, interview, and examination. Because information was provided in large part by the patient, it cannot be guaranteed that it has not been purposely or unconsciously manipulated or altered. Every effort has been made to obtain as much accurate, relevant, available data as possible. Always take into account that the treatment will also be  dependent on availability of resources and existing treatment guidelines, considered by other Pain Management Specialists as being common knowledge and practice, at the time of the intervention. It is also important to point out that variation in procedural techniques and pharmacological choices are the acceptable norm. For Medico-Legal review purposes, the indications,  contraindications, technique, and results of the these procedures should only be evaluated, judged and interpreted by a Board-Certified Interventional Pain Specialist with extensive familiarity and expertise in the same exact procedure and technique.

## 2023-11-23 ENCOUNTER — Telehealth: Payer: Self-pay | Admitting: *Deleted

## 2023-11-23 NOTE — Telephone Encounter (Signed)
Post procedure call;  no answer and no voicemail.  ?

## 2023-12-22 ENCOUNTER — Encounter: Admitting: Internal Medicine

## 2024-01-03 ENCOUNTER — Other Ambulatory Visit: Payer: Self-pay | Admitting: Pharmacist

## 2024-01-03 ENCOUNTER — Telehealth: Payer: Self-pay

## 2024-01-03 DIAGNOSIS — I10 Essential (primary) hypertension: Secondary | ICD-10-CM

## 2024-01-03 NOTE — Patient Instructions (Signed)
 Please watch the mail for an envelope from [facility] containing the patient assistance program application. Please complete this application and bring to office to have it faxed back to Attention: Suzen Mall at Fax # [phone] along with a copy of your Medicare Part D prescription card and a copy of your proof of income document.  If you need to call Suzen, you can reach her at [phone].   Goals Addressed             This Visit's Progress    Pharmacy Goals       Please restart using a weekly pillbox to organize your medications.  Check your blood pressure twice weekly, and any time you have concerning symptoms like headache, chest pain, dizziness, shortness of breath, or vision changes.   Our goal is less than 130/80.  To appropriately check your blood pressure, make sure you do the following:  1) Avoid caffeine, exercise, or tobacco products for 30 minutes before checking. Empty your bladder. 2) Sit with your back supported in a flat-backed chair. Rest your arm on something flat (arm of the chair, table, etc). 3) Sit still with your feet flat on the floor, resting, for at least 5 minutes.  4) Check your blood pressure. Take 1-2 readings.  5) Write down these readings and bring with you to any provider appointments.  Bring your home blood pressure machine with you to a provider's office for accuracy comparison at least once a year.   Make sure you take your blood pressure medications before you come to any office visit, even if you were asked to fast for labs.   Sharyle Sia, PharmD, [facility] Clinical Pharmacist [facility] [phone]

## 2024-01-03 NOTE — Progress Notes (Signed)
   01/03/2024  Patient ID: Pamela Romero, female   DOB: 28-Oct-1950, 73 y.o.   MRN: [id number]  This patient is appearing on a report for being at risk of failing the adherence measure for hypertension (ACEi/ARB) medications this calendar year.   Medication: lisinopril /HCTZ 10-12.5 mg Last fill date: 09/23/2023 for 90 day supply  Reviewed medication indication, dosing, and goals of therapy.   Recalls last checked home blood pressure a couple of days ago and reading ~128/84   Denies recent signs of hypotension  Reports has only a couple of days supply of lisinopril /HCTZ remaining.   Outreach to [employer] and speak with Lorn and request that she fill lisinopril /HCTZ prescription from hold for patient.  Patient plans to pick up from pharmacy  Encourage patient to restart using weekly pillbox to organize her medications  During our call, patient also shares that she has had difficulty with affording cost of her Linzess .  From review of details for patient's plan from [facility] website, Linzess  is a tier 3 medication for patient. Patient has an annual deductible of $250 (impacting tiers 3-5); after deductible, patient's tier 3 copayment is $47/month.  Based on reported income, patient does not meet criteria for Extra Help subsidy  Based on reported income, patient does meet criteria for patient assistance for Linzess  from [employer] - Will collaborate with PCP and CPhT to assist patient with applying for patient assistance   From review of chart, also note patient missed recent appointment with PCP. Recommend patient contact office today to reschedule this appointment.  Follow Up Plan: Clinical Pharmacist will follow up with patient by telephone on 02/12/2024 at 11:30 AM   Sharyle Sia, PharmD, [facility] [phone]

## 2024-01-03 NOTE — Telephone Encounter (Signed)
 PAP: Patient assistance application for linzess  through [employer]) has been mailed to pt's home address on file. Provider portion of application will be faxed to provider's office.

## 2024-01-10 ENCOUNTER — Ambulatory Visit
Admission: RE | Admit: 2024-01-10 | Discharge: 2024-01-10 | Disposition: A | Discharge status: Home / Self Care | Source: Ambulatory Visit | Attending: Nurse Practitioner | Admitting: Nurse Practitioner

## 2024-01-10 ENCOUNTER — Encounter: Payer: Self-pay | Admitting: Nurse Practitioner

## 2024-01-10 ENCOUNTER — Ambulatory Visit
Admission: RE | Admit: 2024-01-10 | Discharge: 2024-01-10 | Disposition: A | Discharge status: Home / Self Care | Attending: Nurse Practitioner | Admitting: Nurse Practitioner

## 2024-01-10 ENCOUNTER — Ambulatory Visit: Admitting: Nurse Practitioner

## 2024-01-10 VITALS — BP 130/92 | HR 93 | Temp 96.3°F | Resp 18 | Ht 68.5 in | Wt 178.0 lb

## 2024-01-10 DIAGNOSIS — M1712 Unilateral primary osteoarthritis, left knee: Secondary | ICD-10-CM

## 2024-01-10 DIAGNOSIS — M5416 Radiculopathy, lumbar region: Secondary | ICD-10-CM | POA: Diagnosis not present

## 2024-01-10 DIAGNOSIS — G894 Chronic pain syndrome: Secondary | ICD-10-CM

## 2024-01-10 DIAGNOSIS — M48062 Spinal stenosis, lumbar region with neurogenic claudication: Secondary | ICD-10-CM | POA: Diagnosis not present

## 2024-01-10 DIAGNOSIS — G8929 Other chronic pain: Secondary | ICD-10-CM

## 2024-01-10 DIAGNOSIS — M722 Plantar fascial fibromatosis: Secondary | ICD-10-CM

## 2024-01-10 DIAGNOSIS — M25562 Pain in left knee: Secondary | ICD-10-CM | POA: Diagnosis not present

## 2024-01-10 MED ORDER — HYDROCODONE-ACETAMINOPHEN 7.5-325 MG PO TABS: 1.0000 | ORAL_TABLET | Freq: Four times a day (QID) | ORAL | 0 refills | Status: DC | PRN

## 2024-01-10 MED ORDER — HYDROCODONE-ACETAMINOPHEN 7.5-325 MG PO TABS: 1.0000 | ORAL_TABLET | Freq: Four times a day (QID) | ORAL | 0 refills | Status: AC | PRN

## 2024-01-10 MED ORDER — NALOXONE HCL 4 MG/0.1ML NA LIQD: 1.0000 | NASAL | 1 refills | Status: AC | PRN

## 2024-01-10 MED ORDER — GABAPENTIN 300 MG PO CAPS: 300.0000 mg | ORAL_CAPSULE | Freq: Every day | ORAL | 2 refills | Status: AC

## 2024-01-10 NOTE — Progress Notes (Signed)
 PROVIDER NOTE: Interpretation of information contained herein should be left to medically-trained personnel. Specific patient instructions are provided elsewhere under Patient Instructions section of medical record. This document was created in part using AI and STT-dictation technology, any transcriptional errors that may result from this process are unintentional.  Patient: Pamela Romero  Service: E/M   PCP: Antonette Angeline ORN, NP  DOB: 21-Oct-1950  DOS: 01/10/2024  Provider: Emmy MARLA Blanch, NP  MRN: [id number]  Delivery: Face-to-face  Specialty: Interventional Pain Management  Type: Established Patient  Setting: Ambulatory outpatient facility  Specialty designation: 09  Referring Prov.: Antonette Angeline ORN, NP  Location: Outpatient office facility       History of present illness (HPI) Pamela Romero, a 73 y.o. year old female, is here today because of her Primary osteoarthritis of left knee [M17.12]. Pamela Romero primary complain today is Knee Pain (Left Knee Pain /)  Pertinent problems: Pamela Romero has  Bilateral primary osteoarthritis of knee and Osteoporosis, and chronic pain syndrome on their pertinent problem list.  Pain Assessment: Severity of Chronic pain is reported as a 6 /10. Location: Knee Left/Radiates down left left leg to foot and toes. Onset: More than a month ago. Quality: Aching, Burning, Constant, Stabbing. Timing: Constant. Modifying factor(s): Medication. Vitals:  height is 5' 8.5 (1.74 m) and weight is 178 lb (80.7 kg). Her temporal temperature is 96.3 F (35.7 C) (abnormal). Her blood pressure is 130/92 (abnormal) and her pulse is 93. Her respiration is 18 and oxygen saturation is 100%.  BMI: Estimated body mass index is 26.67 kg/m as calculated from the following:   Height as of this encounter: 5' 8.5 (1.74 m).   Weight as of this encounter: 178 lb (80.7 kg).  Last encounter: 10/12/2023. Last procedure: 11/22/2023  Reason for encounter: evaluation of worsening, or  previously known (established) problem, medication management, and postprocedure evaluation and assessment. No change in medical history since last visit. Patient's pain is at baseline. Patient continues multimodal pain regimen as prescribed. States that it provides pain relief and improvement in functional status.   Pamela Romero received a therapeutic steroid knee injection on November 22, 2023.  Pamela Romero reports 100% pain relief and functional improvement during local anesthetic phase, followed sustained 90% pain relief and functional improvement since the procedure.  The patient continues experiencing left knee pain in swelling around the kneecap.  We discussed ordering a left left knee x-ray for further evaluation.  Procedure Procedure: Steroid Knee Injection (LONG ACTING Kennalog)  Laterality: Left (-LT) Level: Intra-articular  No.: L8 Series: n/a Purpose: Therapeutic Indications: Knee arthralgia associated to osteoarthritis of the knee    Imaging: None required (CPT-20610) Analgesia: Skin infiltration w/ local anesthetics Sedation: None   NAS-11 score:          Pre-procedure: 7 /10          Post-procedure: 0-No pain/10     1. Bilateral primary osteoarthritis of knee   2. Chronic pain syndrome     Post-Procedure Evaluation  Effectiveness:  Initial hour after procedure: 100 % . Subsequent 4-6 hours post-procedure: 100 % . Analgesia past initial 6 hours: 90 % . Ongoing improvement:  Analgesic:  Pamela Romero received a therapeutic steroid knee injection on November 22, 2023.  Pamela Romero reports 100% pain relief and functional improvement during local anesthetic phase, followed sustained 90% pain relief and functional improvement since the procedure. Function: Pamela Romero reports improvement in function ROM: Pamela Romero reports improvement in ROM  Pharmacotherapy Assessment  Hydrocodone -acetaminophen  (NORCO) 7.5-325 mg every 6 hours as needed for moderate pain (pain score 4-6); MME =  30 Gabapentin  (Neurontin ) 300 mg capsules for neuropathic pain Monitoring: [place] PMP: PDMP reviewed during this encounter.       Pharmacotherapy: No side-effects or adverse reactions reported. Compliance: No problems identified. Effectiveness: Clinically acceptable.  Erlene Doyal SAUNDERS, [place]  01/10/2024 10:49 AM  Sign when Signing Visit Nursing Pain Medication Assessment:  Safety precautions to be maintained throughout the outpatient stay will include: orient to surroundings, keep bed in low position, maintain call bell within reach at all times, provide assistance with transfer out of bed and ambulation.  Medication Inspection Compliance: Pill count conducted under aseptic conditions, in front of the patient. Neither the pills nor the bottle was removed from the patient's sight at any time. Once count was completed pills were immediately returned to the patient in their original bottle.  Medication: Hydrocodone /APAP Pill/Patch Count: 15.5 of 120 pills/patches remain Pill/Patch Appearance: Markings consistent with prescribed medication Bottle Appearance: Standard pharmacy container. Clearly labeled. Filled Date: 09 / 24 / 2025 Last Medication intake:  Today    UDS:  Summary  Date Value Ref Range Status  10/16/2023 FINAL  Final    Comment:    ==================================================================== [employer] [facility] (MW) ==================================================================== Test                             Result       Flag       Units  Drug Present and Declared for Prescription Verification   Hydrocodone                     907          EXPECTED   ng/mg creat   Hydromorphone                  484          EXPECTED   ng/mg creat   Dihydrocodeine                 299          EXPECTED   ng/mg creat   Norhydrocodone                 2785         EXPECTED   ng/mg creat    Sources of hydrocodone  include scheduled prescription medications.    Hydromorphone,  dihydrocodeine and norhydrocodone are expected    metabolites of hydrocodone . Hydromorphone and dihydrocodeine are    also available as scheduled prescription medications.  ==================================================================== Test                      Result    Flag   Units      Ref Range   Creatinine              74               mg/dL      >=79 ==================================================================== Declared Medications:  The flagging and interpretation on this report are based on the  following declared medications.  Unexpected results may arise from  inaccuracies in the declared medications.   **Note: The testing scope of this panel includes these medications:   Hydrocodone  (Norco)   **Note: The testing scope of this panel does not include the  following reported medications:   Acetaminophen  (Norco)  Albuterol  (Ventolin  HFA)  Aspirin   Atorvastatin  (Lipitor)  Esomeprazole  (Nexium )  Famotidine  (Pepcid )  Gabapentin  (Neurontin )  Hydrochlorothiazide  (Zestoretic )  Levothyroxine  (Synthroid )  Linaclotide  (Linzess )  Lisinopril  (Zestoretic )  Ondansetron  (Zofran )  Polyethylene Glycol (GlycoLax )  Potassium (Klor-Con )  Rizatriptan  (Maxalt ) ==================================================================== For clinical consultation, please call [phone]. ====================================================================     No results found for: CBDTHCR No results found for: D8THCCBX No results found for: D9THCCBX  ROS  Constitutional: Denies any fever or chills Gastrointestinal: No reported hemesis, hematochezia, vomiting, or acute GI distress Musculoskeletal: Left knee pain and swelling Neurological: No reported episodes of acute onset apraxia, aphasia, dysarthria, agnosia, amnesia, paralysis, loss of coordination, or loss of consciousness  Medication Review  HYDROcodone -acetaminophen , albuterol , aspirin  EC, atorvastatin ,  esomeprazole , famotidine , gabapentin , levothyroxine , linaclotide , lisinopril -hydrochlorothiazide , ondansetron , polyethylene glycol powder, potassium chloride  SA, and rizatriptan   History Review  Allergy: Pamela Romero has no known allergies. Drug: Pamela Romero  reports no history of drug use. Alcohol:  reports no history of alcohol use. Tobacco:  reports that Pamela Romero quit smoking about 5 years ago. Her smoking use included cigarettes. Pamela Romero started smoking about 55 years ago. Pamela Romero has a 25 pack-year smoking history. Pamela Romero has quit using smokeless tobacco. Social: Pamela Romero  reports that Pamela Romero quit smoking about 5 years ago. Her smoking use included cigarettes. Pamela Romero started smoking about 55 years ago. Pamela Romero has a 25 pack-year smoking history. Pamela Romero has quit using smokeless tobacco. Pamela Romero reports that Pamela Romero does not drink alcohol and does not use drugs. Medical:  has a past medical history of Abnormal EKG, CAD (coronary artery disease), Cancer (HCC), COPD (chronic obstructive pulmonary disease) (HCC), Endometrial adenocarcinoma (HCC) (2011), Hyperlipidemia, Hypertension, Osteoarthritis, Reflux gastritis, Thyroid  disease, tobacco abuse, and Vaginal delivery. Surgical: Pamela Romero  has a past surgical history that includes Thyroidectomy (2005); Cardiac catheterization (2006); Total abdominal hysterectomy; Breast surgery (Right, March 2014); Breast cyst aspiration (Right, 2014); Esophagogastroduodenoscopy (N/A, 04/07/2020); Esophagogastroduodenoscopy (egd) with propofol  (N/A, 11/07/2022); biopsy (11/07/2022); Balloon dilation (11/07/2022); and polypectomy (11/07/2022). Family: family history includes Alcohol abuse in her father, maternal aunt, and mother; Cancer in her brother, brother, brother, and maternal aunt; Heart disease in her brother.  Laboratory Chemistry Profile   Renal Lab Results  Component Value Date   BUN 20 05/09/2023   CREATININE 1.17 (H) 05/09/2023   BCR 17 05/09/2023   GFR 60.34 01/21/2015   GFRAA 61  01/02/2020   GFRNONAA >60 04/07/2020    Hepatic Lab Results  Component Value Date   AST 11 05/09/2023   ALT 6 05/09/2023   ALBUMIN 3.8 (L) 09/01/2022   ALKPHOS 77 09/01/2022   HCVAB NEGATIVE 01/21/2015   LIPASE 13 (L) 09/01/2022    Electrolytes Lab Results  Component Value Date   NA 140 05/09/2023   K 4.0 05/09/2023   CL 102 05/09/2023   CALCIUM  10.3 05/09/2023   MG 1.3 (L) 09/15/2021    Bone Lab Results  Component Value Date   VD25OH 18 (L) 12/09/2021    Inflammation (CRP: Acute Phase) (ESR: Chronic Phase) Lab Results  Component Value Date   ESRSEDRATE 19 03/30/2012         Note: Above Lab results reviewed.  Recent Imaging Review  DG Lumbar Spine Complete CLINICAL DATA:  Chronic low back pain for 4 years.  EXAM: LUMBAR SPINE - COMPLETE 4+ VIEW  COMPARISON:  None Available.  FINDINGS: There is no evidence of lumbar spine fracture. Alignment is normal. Mild-to-moderate narrow intervertebral space at L2-3. Mild narrow intervertebral spaces at L1-2, L5-S1. Mild facet joint sclerosis is noted  at L4-5 and L5-S1.  IMPRESSION: Degenerative joint changes of lumbar spine.  Electronically Signed   By: Craig Farr M.D.   On: 08/10/2023 15:03 Note: Reviewed        Physical Exam  Vitals: BP (!) 130/92 (BP Location: Right Arm, Patient Position: Sitting)   Pulse 93   Temp (!) 96.3 F (35.7 C) (Temporal)   Resp 18   Ht 5' 8.5 (1.74 m)   Wt 178 lb (80.7 kg)   SpO2 100%   BMI 26.67 kg/m  BMI: Estimated body mass index is 26.67 kg/m as calculated from the following:   Height as of this encounter: 5' 8.5 (1.74 m).   Weight as of this encounter: 178 lb (80.7 kg). Ideal: Ideal body weight: 65 kg (143 lb 6.5 oz) Adjusted ideal body weight: 71.3 kg (157 lb 3.9 oz) General appearance: Well nourished, well developed, and well hydrated. In no apparent acute distress Mental status: Alert, oriented x 3 (person, place, & time)       Respiratory: No evidence of acute  respiratory distress Eyes: PERLA  Musculoskeletal: Left knee pain and swelling  Assessment   Diagnosis Status  1. Primary osteoarthritis of left knee   2. Spinal stenosis, lumbar region, with neurogenic claudication   3. Chronic pain syndrome   4. Chronic radicular lumbar pain (left)   5. Plantar fascia syndrome    Improving Controlled Controlled   Updated Problems: No problems updated.  Plan of Care  Problem-specific:  Assessment and Plan  Primary osteoarthritis of left knee: The patient reported significant improvement with the steroid injection to the left knee, achieving approximately 90% pain relief and improved function.  However, Pamela Romero has noticed swelling in her left knee.  We discussed obtaining a left knee x-ray to further evaluate the cause, considering arthritis versus joint effusion.  Chronic pain syndrome: Patient's pain is well-controlled with Norco and gabapentin , will continue on current medication regimen.  Prescribing drug monitoring (PMP) reviewed, findings consistent with the use of prescribed medication and no evidence of narcotic misuse or abuse.  Urine drug screening (UDS) up to date.  Schedule follow-up in 90 days for medication management  Spinal stenosis, lumbar region with neurogenic claudication: Low back pain is well-controlled and managed with Norco and gabapentin .  Pamela Romero has a current medication list which includes the following long-term medication(s): albuterol , atorvastatin , esomeprazole , famotidine , levothyroxine , linaclotide , lisinopril -hydrochlorothiazide , potassium chloride  sa, rizatriptan , and gabapentin .  Pharmacotherapy (Medications Ordered): Meds ordered this encounter  Medications   HYDROcodone -acetaminophen  (NORCO) 7.5-325 MG tablet    Sig: Take 1 tablet by mouth every 6 (six) hours as needed for moderate pain (pain score 4-6).    Dispense:  120 tablet    Refill:  0    Fill up one day early.    HYDROcodone -acetaminophen  (NORCO) 7.5-325 MG tablet    Sig: Take 1 tablet by mouth every 6 (six) hours as needed for moderate pain (pain score 4-6).    Dispense:  120 tablet    Refill:  0    Fill up one day early   HYDROcodone -acetaminophen  (NORCO) 7.5-325 MG tablet    Sig: Take 1 tablet by mouth every 6 (six) hours as needed for moderate pain (pain score 4-6).    Dispense:  120 tablet    Refill:  0    Fill up one day early.   gabapentin  (NEURONTIN ) 300 MG capsule    Sig: Take 1-2 capsules (300-600 mg total) by mouth at bedtime.  Dispense:  180 capsule    Refill:  2   Orders:  Orders Placed This Encounter  Procedures   DG Knee Complete 4 Views Left    Standing Status:   Future    Number of Occurrences:   1    Expiration Date:   04/11/2024    Scheduling Instructions:     Please make sure that the patient understands that this needs to be done as soon as possible. Never have the patient do the imaging just before the next appointment. Inform patient that having the imaging done within the [facility] will expedite the availability of the results and will provide      imaging availability to the requesting physician. In addition inform the patient that the imaging order has an expiration date and will not be renewed if not done within the active period.    Reason for Exam (SYMPTOM  OR DIAGNOSIS REQUIRED):   Left knee pain/arthralgia    Preferred imaging location?:   [facility]    Release to patient:   Immediate    Call Results- Best Contact Number?:   [phone] [place] [facility]     MM - Hydrocodone -acetaminophen  (NORCO) 7.5-325 mg every 6 hours as needed for moderate pain (pain score 4-6)     Return in about 1 week (around 01/17/2024) for (VV), review of ordered tests, Emmy Blanch NP.    Recent Visits Date Type Provider Dept  11/22/23 Procedure visit Marcelino Nurse, MD [facility]  10/12/23 Office Visit Vermon Grays,  Durwood Dittus K, NP [facility]  Showing recent visits within past 90 days and meeting all other requirements Today's Visits Date Type Provider Dept  01/10/24 Office Visit Jeanelle Dake K, NP [facility]  Showing today's visits and meeting all other requirements Future Appointments Date Type Provider Dept  01/17/24 Appointment Janiah Devinney K, NP [facility]  04/09/24 Appointment Brinlee Gambrell K, NP [facility]  Showing future appointments within next 90 days and meeting all other requirements  I discussed the assessment and treatment plan with the patient. The patient was provided an opportunity to ask questions and all were answered. The patient agreed with the plan and demonstrated an understanding of the instructions.  Patient advised to call back or seek an in-person evaluation if the symptoms or condition worsens.  I personally spent a total of 30 minutes in the care of the patient today including preparing to see the patient, getting/reviewing separately obtained history, performing a medically appropriate exam/evaluation, counseling and educating, placing orders, referring and communicating with other health care professionals, documenting clinical information in the EHR, independently interpreting results, communicating results, and coordinating care.   Note by: Emmy MARLA Blanch, NP  Date: 01/10/2024; Time: 12:08 PM

## 2024-01-10 NOTE — Progress Notes (Signed)
 Nursing Pain Medication Assessment:  Safety precautions to be maintained throughout the outpatient stay will include: orient to surroundings, keep bed in low position, maintain call bell within reach at all times, provide assistance with transfer out of bed and ambulation.  Medication Inspection Compliance: Pill count conducted under aseptic conditions, in front of the patient. Neither the pills nor the bottle was removed from the patient's sight at any time. Once count was completed pills were immediately returned to the patient in their original bottle.  Medication: Hydrocodone /APAP Pill/Patch Count: 15.5 of 120 pills/patches remain Pill/Patch Appearance: Markings consistent with prescribed medication Bottle Appearance: Standard pharmacy container. Clearly labeled. Filled Date: 09 / 24 / 2025 Last Medication intake:  Today

## 2024-01-16 ENCOUNTER — Encounter: Payer: Self-pay | Admitting: Nurse Practitioner

## 2024-01-16 NOTE — Telephone Encounter (Signed)
 Reached out to patient regarding Pap Linzess  [place]) no answer or v/m available. Will follow up again soon to see if application received.

## 2024-01-17 ENCOUNTER — Ambulatory Visit: Attending: Nurse Practitioner | Admitting: Nurse Practitioner

## 2024-01-17 DIAGNOSIS — M5416 Radiculopathy, lumbar region: Secondary | ICD-10-CM

## 2024-01-17 DIAGNOSIS — M48062 Spinal stenosis, lumbar region with neurogenic claudication: Secondary | ICD-10-CM | POA: Diagnosis not present

## 2024-01-17 DIAGNOSIS — M170 Bilateral primary osteoarthritis of knee: Secondary | ICD-10-CM | POA: Diagnosis not present

## 2024-01-17 DIAGNOSIS — G894 Chronic pain syndrome: Secondary | ICD-10-CM | POA: Diagnosis not present

## 2024-01-17 DIAGNOSIS — M1712 Unilateral primary osteoarthritis, left knee: Secondary | ICD-10-CM | POA: Diagnosis not present

## 2024-01-17 DIAGNOSIS — G8929 Other chronic pain: Secondary | ICD-10-CM

## 2024-01-17 NOTE — Progress Notes (Signed)
 PROVIDER NOTE: Interpretation of information contained herein should be left to medically-trained personnel. Specific patient instructions are provided elsewhere under Patient Instructions section of medical record. This document was created in part using AI and STT-dictation technology, any transcriptional errors that may result from this process are unintentional.  Patient: Pamela Romero  Service: E/M   PCP: Antonette Angeline ORN, NP  DOB: 05-22-1950  DOS: 01/17/2024  Provider: Emmy MARLA Blanch, NP  MRN: [id number]  Delivery: Virtual Visit  Specialty: Interventional Pain Management  Type: Established Patient  Setting: Ambulatory outpatient facility  Specialty designation: 09  Referring Prov.: Antonette Angeline ORN, NP  Location: Remote location       Virtual Encounter - Pain Management PROVIDER NOTE: Information contained herein reflects review and annotations entered in association with encounter. Interpretation of such information and data should be left to medically-trained personnel. Information provided to patient can be located elsewhere in the medical record under Patient Instructions. Document created using STT-dictation technology, any transcriptional errors that may result from process are unintentional.    Contact & Pharmacy Preferred: [phone] Home: [phone] (home) Mobile: There is no such number on file (mobile). E-mail: No e-mail address on record  [facility] [place], [place] - [address] [place] [address] Phone: [phone] Fax: [phone]   Pre-screening  Pamela Romero offered in-person vs virtual encounter. She indicated preferring virtual for this encounter.   Reason COVID-19*  Social distancing based on CDC and AMA recommendations.   I contacted Pamela Romero on 01/17/2024 via telephone.      I clearly identified myself as Emmy MARLA Blanch, NP. I verified that I was speaking with the correct person using two identifiers (Name: [name], and date of birth: 1950-10-22).  Consent I sought verbal advanced consent from Pamela Romero for virtual visit interactions. I informed Pamela Romero of possible security and privacy concerns, risks, and limitations associated with providing not-in-person medical evaluation and management services. I also informed Pamela Romero of the availability of in-person appointments. Finally, I informed her that there would be a charge for the virtual visit and that she could be  personally, fully or partially, financially responsible for it. Pamela Romero expressed understanding and agreed to proceed.   Historic Elements   Pamela Romero is a 73 y.o. year old, female patient evaluated today after our last contact on 01/10/2024. Pamela Romero  has a past medical history of Abnormal EKG, CAD (coronary artery disease), Cancer (HCC), COPD (chronic obstructive pulmonary disease) (HCC), Endometrial adenocarcinoma (HCC) (2011), Hyperlipidemia, Hypertension, Osteoarthritis, Reflux gastritis, Thyroid  disease, tobacco abuse, and Vaginal delivery. She also  has a past surgical history that includes Thyroidectomy (2005); Cardiac catheterization (2006); Total abdominal hysterectomy; Breast surgery (Right, March 2014); Breast cyst aspiration (Right, 2014); Esophagogastroduodenoscopy (N/A, 04/07/2020); Esophagogastroduodenoscopy (egd) with propofol  (N/A, 11/07/2022); biopsy (11/07/2022); Balloon dilation (11/07/2022); and polypectomy (11/07/2022). Pamela Romero has a current medication list which includes the following prescription(s): albuterol , aspirin  ec, atorvastatin , esomeprazole , famotidine , gabapentin , hydrocodone -acetaminophen , [START ON 02/12/2024] hydrocodone -acetaminophen , [START ON 03/13/2024] hydrocodone -acetaminophen , levothyroxine , linaclotide , lisinopril -hydrochlorothiazide , naloxone, ondansetron , polyethylene glycol powder, potassium chloride  sa, and rizatriptan . She  reports that she quit smoking about 5  years ago. Her smoking use included cigarettes. She started smoking about 55 years ago. She has a 25 pack-year smoking history. She has quit using smokeless tobacco. She reports that she does not drink alcohol and does not use drugs. Pamela Romero has no known allergies.  BMI: Estimated body  mass index is 26.67 kg/m as calculated from the following:   Height as of 01/10/24: 5' 8.5 (1.74 m).   Weight as of 01/10/24: 178 lb (80.7 kg). Last encounter: 01/10/2024. Last procedure: Visit date not found.  HPI  Today, she is being contacted for follow-up evaluation.  The patient was evaluated following her left knee x-ray, which showed Mild to moderate tricompartmental osteoarthritis, most prominent in the medial tibiofemoral compartment.  She has been receiving intra-articular knee injections every 3 months or as needed for her pain control which provided symptomatic pain relief. Pharmacotherapy Assessment  [place] PMP: PDMP not reviewed this encounter.       Pharmacotherapy: No side-effects or adverse reactions reported. Compliance: No problems identified. Effectiveness: Clinically acceptable. Plan: Refer to POC.  UDS:  Summary  Date Value Ref Range Status  10/16/2023 FINAL  Final    Comment:    ==================================================================== [employer] Select 13 (MW) ==================================================================== Test                             Result       Flag       Units  Drug Present and Declared for Prescription Verification   Hydrocodone                     907          EXPECTED   ng/mg creat   Hydromorphone                  484          EXPECTED   ng/mg creat   Dihydrocodeine                 299          EXPECTED   ng/mg creat   Norhydrocodone                 2785         EXPECTED   ng/mg creat    Sources of hydrocodone  include scheduled prescription medications.    Hydromorphone, dihydrocodeine and norhydrocodone are expected    metabolites of  hydrocodone . Hydromorphone and dihydrocodeine are    also available as scheduled prescription medications.  ==================================================================== Test                      Result    Flag   Units      Ref Range   Creatinine              74               mg/dL      >=79 ==================================================================== Declared Medications:  The flagging and interpretation on this report are based on the  following declared medications.  Unexpected results may arise from  inaccuracies in the declared medications.   **Note: The testing scope of this panel includes these medications:   Hydrocodone  (Norco)   **Note: The testing scope of this panel does not include the  following reported medications:   Acetaminophen  (Norco)  Albuterol  (Ventolin  HFA)  Aspirin   Atorvastatin  (Lipitor)  Esomeprazole  (Nexium )  Famotidine  (Pepcid )  Gabapentin  (Neurontin )  Hydrochlorothiazide  (Zestoretic )  Levothyroxine  (Synthroid )  Linaclotide  (Linzess )  Lisinopril  (Zestoretic )  Ondansetron  (Zofran )  Polyethylene Glycol (GlycoLax )  Potassium (Klor-Con )  Rizatriptan  (Maxalt ) ==================================================================== For clinical consultation, please call [phone]. ====================================================================    No results found for: CBDTHCR, D8THCCBX, D9THCCBX  Laboratory Chemistry Profile  Renal Lab Results  Component Value Date   BUN 20 05/09/2023   CREATININE 1.17 (H) 05/09/2023   BCR 17 05/09/2023   GFR 60.34 01/21/2015   GFRAA 61 01/02/2020   GFRNONAA >60 04/07/2020    Hepatic Lab Results  Component Value Date   AST 11 05/09/2023   ALT 6 05/09/2023   ALBUMIN 3.8 (L) 09/01/2022   ALKPHOS 77 09/01/2022   HCVAB NEGATIVE 01/21/2015   LIPASE 13 (L) 09/01/2022    Electrolytes Lab Results  Component Value Date   NA 140 05/09/2023   K 4.0 05/09/2023   CL 102 05/09/2023    CALCIUM  10.3 05/09/2023   MG 1.3 (L) 09/15/2021    Bone Lab Results  Component Value Date   VD25OH 18 (L) 12/09/2021    Inflammation (CRP: Acute Phase) (ESR: Chronic Phase) Lab Results  Component Value Date   ESRSEDRATE 19 03/30/2012         Note: Above Lab results reviewed.  Imaging  DG Knee Complete 4 Views Left CLINICAL DATA:  Left knee pain/arthralgia. Primary osteoarthritis of left knee.  EXAM: LEFT KNEE - COMPLETE 4+ VIEW  COMPARISON:  MRI 09/01/2017 reviewed  FINDINGS: Medial tibiofemoral joint space narrowing. Mild to moderate tricompartmental peripheral spurring. Subchondral sclerosis in the medial tibiofemoral compartment. No fracture, erosion, or focal bone abnormality. No joint effusion. Peripheral vascular calcifications.  IMPRESSION: Mild to moderate tricompartmental osteoarthritis, most prominent in the medial tibiofemoral compartment.  Electronically Signed   By: Andrea Gasman M.D.   On: 01/14/2024 14:00  Assessment  The primary encounter diagnosis was Primary osteoarthritis of left knee. Diagnoses of Spinal stenosis, lumbar region, with neurogenic claudication, Chronic pain syndrome, Chronic radicular lumbar pain (left), and Bilateral primary osteoarthritis of knee were also pertinent to this visit.  Plan of Care  Problem-specific:  The patient was evaluated following her left knee x-ray, which showed Mild to moderate tricompartmental osteoarthritis, most prominent in the medial tibiofemoral compartment.  She has been receiving intra-articular knee injections every 3 months or as needed for her pain control which provided symptomatic pain relief.  We discussed continuing with her current treatment and monitoring her response.  Pamela Romero has a current medication list which includes the following long-term medication(s): albuterol , atorvastatin , esomeprazole , famotidine , gabapentin , levothyroxine , linaclotide ,  lisinopril -hydrochlorothiazide , potassium chloride  sa, and rizatriptan .  Pharmacotherapy (Medications Ordered): No orders of the defined types were placed in this encounter.  Orders:  No orders of the defined types were placed in this encounter.  Follow-up plan:   No follow-ups on file.             Recent Visits Date Type Provider Dept  01/10/24 Office Visit Sharona Rovner K, NP [facility]  11/22/23 Procedure visit Marcelino Nurse, MD [facility]  Showing recent visits within past 90 days and meeting all other requirements Today's Visits Date Type Provider Dept  01/17/24 Office Visit Roylee Chaffin K, NP [facility]  Showing today's visits and meeting all other requirements Future Appointments Date Type Provider Dept  04/09/24 Appointment Berkeley Veldman K, NP [facility]  Showing future appointments within next 90 days and meeting all other requirements  I discussed the assessment and treatment plan with the patient. The patient was provided an opportunity to ask questions and all were answered. The patient agreed with the plan and demonstrated an understanding of the instructions.  Patient advised to call back or seek an in-person evaluation if the symptoms or condition worsens.  Duration of encounter: 10 minutes.  Note by: Emmy MARLA Blanch, NP Date: 01/17/2024; Time: 3:13 PM

## 2024-01-19 ENCOUNTER — Other Ambulatory Visit (HOSPITAL_COMMUNITY): Payer: Self-pay

## 2024-01-19 NOTE — Telephone Encounter (Signed)
 Spoke to patient regarding PAP Linzess  and she just received it because it was [place] the [place] sent [place] requesting patient portion of application , so will send asap.

## 2024-01-26 NOTE — Telephone Encounter (Signed)
 Received Patient portion of Pap Application Linzess  ([employer]) will follow up with Provider.

## 2024-01-29 NOTE — Telephone Encounter (Signed)
 PAP: Application for LINZESS  has been submitted to [employer]), via fax

## 2024-02-01 NOTE — Progress Notes (Signed)
 Pamela Romero                                          MRN: [id number]   02/01/2024   The VBCI Quality Team Specialist reviewed this patient medical record for the purposes of chart review for care gap closure. The following were reviewed: chart review for care gap closure-colorectal cancer screening.    VBCI Quality Team

## 2024-02-12 ENCOUNTER — Other Ambulatory Visit: Admitting: Pharmacist

## 2024-02-12 DIAGNOSIS — I10 Essential (primary) hypertension: Secondary | ICD-10-CM

## 2024-02-12 NOTE — Progress Notes (Signed)
 02/12/2024 Name: Pamela Romero MRN: [id number] DOB: 04/25/1950  Chief Complaint  Patient presents with   Medication Assistance   Medication Management    Pamela Romero is a 73 y.o. year old female who presented for a telephone visit.   They were referred to the pharmacist by a quality report for assistance in managing medication adherence.    Subjective:  Care Team: Primary Care Provider: Antonette Angeline ORN, NP Pain Specialist: Patel, Seema K, NP; Next Scheduled Visit: 04/09/2024  Medication Access/Adherence  Current Pharmacy:  [facility] - [place], [place] - [address] [place] [address] Phone: [phone] Fax: [phone]   Patient reports affordability concerns with their medications: Yes  Patient reports access/transportation concerns to their pharmacy: No  Patient reports adherence concerns with their medications:  No    Reports picked up refill of her lisinopril /HCTZ prescription since we last spoke and has been taking this consistently. Denies using weekly pillbox, but reports taking consistently  Collaborating with PCP and CPhT to assist patient with applying for patient assistance for Linzess  from [employer]  From review of chart, note CPhT submitted application to [employer] on 01/29/2024  Hypertension:  Current medications: lisinopril /HCTZ 10-12.5 mg daily   Patient has an automated, upper arm home BP cuff Current blood pressure readings readings: Today: 132/84 (denies having recorded HR with this reading)  Patient denies hypotensive s/sx including dizziness, lightheadedness as long as takes positional changes slowly  Current physical activity: reports limited by leg pain   Objective:  Lab Results  Component Value Date   CREATININE 1.17 (H) 05/09/2023   BUN 20 05/09/2023   NA 140 05/09/2023   K 4.0 05/09/2023   CL 102 05/09/2023   CO2 28 05/09/2023   BP Readings from Last 3 Encounters:  01/10/24 (!) 130/92  11/22/23 101/69   11/17/23 100/72   Pulse Readings from Last 3 Encounters:  01/10/24 93  11/22/23 (!) 108  10/12/23 87      Current Outpatient Medications on File Prior to Visit  Medication Sig Dispense Refill   albuterol  (PROAIR  HFA) 108 (90 Base) MCG/ACT inhaler Inhale 1-2 puffs into the lungs every 6 (six) hours as needed for wheezing or shortness of breath. 6.7 g 1   aspirin  EC 81 MG tablet Take 1 tablet (81 mg total) by mouth daily. Swallow whole. 30 tablet 12   atorvastatin  (LIPITOR) 10 MG tablet TAKE 1 TABLET BY MOUTH ONCE DAILY 90 tablet 1   esomeprazole  (NEXIUM ) 40 MG capsule Take 1 capsule (40 mg total) by mouth 2 (two) times daily before a meal. 180 capsule 0   famotidine  (PEPCID ) 20 MG tablet Take 1 tablet (20 mg total) by mouth at bedtime. 90 tablet 1   gabapentin  (NEURONTIN ) 300 MG capsule Take 1-2 capsules (300-600 mg total) by mouth at bedtime. 180 capsule 2   HYDROcodone -acetaminophen  (NORCO) 7.5-325 MG tablet Take 1 tablet by mouth every 6 (six) hours as needed for moderate pain (pain score 4-6). 120 tablet 0   HYDROcodone -acetaminophen  (NORCO) 7.5-325 MG tablet Take 1 tablet by mouth every 6 (six) hours as needed for moderate pain (pain score 4-6). 120 tablet 0   [START ON 03/13/2024] HYDROcodone -acetaminophen  (NORCO) 7.5-325 MG tablet Take 1 tablet by mouth every 6 (six) hours as needed for moderate pain (pain score 4-6). 120 tablet 0   levothyroxine  (SYNTHROID ) 125 MCG tablet Take 1 tablet (125 mcg total) by mouth daily before breakfast. 90 tablet 3   linaclotide  (LINZESS )  145 MCG CAPS capsule Take 1 capsule (145 mcg total) by mouth daily before breakfast. As needed. 90 capsule 1   lisinopril -hydrochlorothiazide  (ZESTORETIC ) 10-12.5 MG tablet TAKE 1 TABLET BY MOUTH ONCE DAILY *DOSE DECREASE 90 tablet 0   naloxone (NARCAN) nasal spray 4 mg/0.1 mL Place 1 spray into the nose as needed for up to 365 doses (for opioid-induced respiratory depresssion). In case of emergency (overdose), spray  once into each nostril. If no response within 3 minutes, repeat application and call 911. 1 each 1   ondansetron  (ZOFRAN -ODT) 4 MG disintegrating tablet Take 1 tablet (4 mg total) by mouth daily as needed for nausea or vomiting. 30 tablet 0   polyethylene glycol powder (GLYCOLAX /MIRALAX ) 17 GM/SCOOP powder Take 17 g by mouth daily. 3350 g 1   potassium chloride  SA (KLOR-CON  M) 20 MEQ tablet Take 1 tablet (20 mEq total) by mouth daily. 5 tablet 0   rizatriptan  (MAXALT -MLT) 10 MG disintegrating tablet Take 1 tablet (10 mg total) by mouth as needed for migraine. May repeat in 2 hours if needed 10 tablet 2   No current facility-administered medications on file prior to visit.       Assessment/Plan:   Collaborating with PCP and CPhT to assist patient with applying for patient assistance for Linzess  from [employer]  Again encourage patient to restart using weekly pillbox to aid with medication adherence and to also use latest copy of medication list when refilling pillbox.   Hypertension: - Reviewed long term cardiovascular and renal outcomes of uncontrolled blood pressure - Recommend to obtain a new upper arm blood pressure monitor using health plan over the counter benefit, restart monitoring home blood pressure, keep log of results and have this record to review during upcoming medical appointments    Follow Up Plan: Clinical Pharmacist will follow up with patient by telephone on 04/22/2024 at 11:30 AM    Sharyle Sia, PharmD, [facility] Clinical Pharmacist [facility] [phone]

## 2024-02-12 NOTE — Patient Instructions (Signed)
 Goals Addressed             This Visit's Progress    Pharmacy Goals       Please restart using a weekly pillbox to organize your medications.  Check your blood pressure twice weekly, and any time you have concerning symptoms like headache, chest pain, dizziness, shortness of breath, or vision changes.   Our goal is less than 130/80.  To appropriately check your blood pressure, make sure you do the following:  1) Avoid caffeine, exercise, or tobacco products for 30 minutes before checking. Empty your bladder. 2) Sit with your back supported in a flat-backed chair. Rest your arm on something flat (arm of the chair, table, etc). 3) Sit still with your feet flat on the floor, resting, for at least 5 minutes.  4) Check your blood pressure. Take 1-2 readings.  5) Write down these readings and bring with you to any provider appointments.  Bring your home blood pressure machine with you to a provider's office for accuracy comparison at least once a year.   Make sure you take your blood pressure medications before you come to any office visit, even if you were asked to fast for labs.  Sharyle Sia, PharmD, [facility] Clinical Pharmacist [facility] [phone]

## 2024-03-27 NOTE — Telephone Encounter (Signed)
 Reached out to [employer] (Linzess ) and had to refax the patient portion with Insurance documentation.

## 2024-04-01 ENCOUNTER — Other Ambulatory Visit (INDEPENDENT_AMBULATORY_CARE_PROVIDER_SITE_OTHER): Payer: Self-pay | Admitting: Pharmacist

## 2024-04-01 DIAGNOSIS — I10 Essential (primary) hypertension: Secondary | ICD-10-CM

## 2024-04-01 MED ORDER — LISINOPRIL-HYDROCHLOROTHIAZIDE 10-12.5 MG PO TABS: 1.0000 | ORAL_TABLET | Freq: Every day | ORAL | 0 refills | Status: AC

## 2024-04-01 NOTE — Progress Notes (Signed)
" ° °  04/01/2024  Patient ID: Pamela Romero, female   DOB: January 06, 1951, 73 y.o.   MRN: [id number]  This patient is appearing on a report for being at risk of failing the adherence measure for hypertension (ACEi/ARB) medications this calendar year.   Medication: lisinopril /HCTZ 10-12.5 mg  Last fill date: 01/03/2024 for 90 day supply  Outreach to patient today to discuss medication adherence and hypertension control.  Patient reports that she restarted using weekly pillbox as adherence aid and denies missed doses  Hypertension:   Current medications: lisinopril /HCTZ 10-12.5 mg daily     Patient has an automated, upper arm home BP cuff Recalls recent home blood pressure readings ranging 118-130/78-88   Patient denies hypotensive s/sx including dizziness, lightheadedness as long as takes positional changes slowly   Current physical activity: reports limited by leg pain   BP Readings from Last 3 Encounters:  01/10/24 (!) 130/92  11/22/23 101/69  11/17/23 100/72   Pulse Readings from Last 3 Encounters:  01/10/24 93  11/22/23 (!) 108  10/12/23 87    Assessment/Plan:    Find that patient has only ~1 week supply of lisinopril /HCTZ remaining.  Will collaborate with PCP to request provider send renewal to pharmacy for patient  Advise patient to contact office to reschedule missed appointment with PCP  Collaborating with PCP and CPhT to assist patient with applying for patient assistance for Linzess  from [employer]   Encourage patient to continue using weekly pillbox to aid with medication adherence   Hypertension: - Reviewed long term cardiovascular and renal outcomes of uncontrolled blood pressure - Recommend to obtain a new upper arm blood pressure monitor using health plan over the counter benefit, restart monitoring home blood pressure, keep log of results and have this record to review during upcoming medical appointments    Follow Up Plan: Clinical Pharmacist will follow up  with patient by telephone on 04/22/2024 at 11:30 AM    Sharyle Sia, PharmD, [facility] Clinical Pharmacist [facility] [phone]  "

## 2024-04-01 NOTE — Patient Instructions (Signed)
 Goals Addressed             This Visit's Progress    Pharmacy Goals       Please restart using a weekly pillbox to organize your medications.  Check your blood pressure twice weekly, and any time you have concerning symptoms like headache, chest pain, dizziness, shortness of breath, or vision changes.   Our goal is less than 130/80.  To appropriately check your blood pressure, make sure you do the following:  1) Avoid caffeine, exercise, or tobacco products for 30 minutes before checking. Empty your bladder. 2) Sit with your back supported in a flat-backed chair. Rest your arm on something flat (arm of the chair, table, etc). 3) Sit still with your feet flat on the floor, resting, for at least 5 minutes.  4) Check your blood pressure. Take 1-2 readings.  5) Write down these readings and bring with you to any provider appointments.  Bring your home blood pressure machine with you to a provider's office for accuracy comparison at least once a year.   Make sure you take your blood pressure medications before you come to any office visit, even if you were asked to fast for labs.  Sharyle Sia, PharmD, [facility] Clinical Pharmacist [facility] [phone]

## 2024-04-07 DIAGNOSIS — F331 Major depressive disorder, recurrent, moderate: Secondary | ICD-10-CM | POA: Insufficient documentation

## 2024-04-07 NOTE — Progress Notes (Unsigned)
 PROVIDER NOTE: Interpretation of information contained herein should be left to medically-trained personnel. Specific patient instructions are provided elsewhere under Patient Instructions section of medical record. This document was created in part using AI and STT-dictation technology, any transcriptional errors that may result from this process are unintentional.  Patient: Montel CHRISTELLA Bowers  Service: E/M   PCP: Antonette Angeline ORN, NP  DOB: 24-Jul-1950  DOS: 04/09/2024  Provider: Emmy MARLA Blanch, NP  MRN: [id number]  Delivery: Face-to-face  Specialty: Interventional Pain Management  Type: Established Patient  Setting: Ambulatory outpatient facility  Specialty designation: 09  Referring Prov.: Antonette Angeline ORN, NP  Location: Outpatient office facility       History of present illness (HPI) Ms. DELESA KAWA, a 73 y.o. year old female, is here today because of her Primary osteoarthritis of left knee [M17.12]. Ms. Blasing primary complain today is left knee pain   Pertinent problems: Ms. 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  Pain Assessment: Severity of Chronic pain is reported as a 8 /10. Location: Leg Left/denies. Onset: More than a month ago. Quality: Other (Comment) (steady). Timing: Constant. Modifying factor(s): medications. Vitals:  height is 5' 8 (1.727 m) and weight is 188 lb (85.3 kg). Her temporal temperature is 97 F (36.1 C) (abnormal). Her blood pressure is 120/57 (abnormal) and her pulse is 91. Her respiration is 18 and oxygen saturation is 98%.  BMI: Estimated body mass index is 28.59 kg/m as calculated from the following:   Height as of this encounter: 5' 8 (1.727 m).   Weight as of this encounter: 188 lb (85.3 kg).  Last encounter:  01/17/2024. Last procedure: 11/22/2023  Reason for encounter: evaluation for possible interventional PM therapy/treatment and medication management. No change in medical history since last visit.  Patient's pain is at baseline.  Patient continues multimodal pain regimen as prescribed.  States that it provides pain relief and improvement in functional status.   Of note, the patient received an intra-articular steroid knee injection on November 22, 2023 with 90% pain relief and functional improvement for approximately 4 months.  Given the flareup of his left knee pain, she expressed interest in repeating the procedure.  Discussed the use of AI scribe software for clinical note transcription with the patient, who gave verbal consent to proceed.  History of Present Illness   Gabi DINARA LUPU is a 72 year old female with moderate osteoarthritis who presents with worsening left knee pain.  She describes the left knee pain as a constant ache with episodes of stabbing pain that disrupt her sleep. The pain is primarily on the left side of the knee and radiates down to her toes. It has become almost unbearable at times, affecting her ability to walk up steps normally, requiring her to hold onto the banister and step up one at a time.  She has been taking hydrocodone  7.5-325 mg every six hours for pain management, but sometimes requires more than one dose to alleviate the pain, disrupting her medication schedule. She experiences constipation as a side effect and uses over-the-counter Miralax , although it takes longer to work. She is attempting to obtain Linzess  to help with constipation but faces financial and administrative hurdles in acquiring it at an affordable price.  She recalls severe back pain around September to October, which she believes was  related to her previous work activities. However, she is not currently working, and the back pain has subsided.  Pharmacotherapy Assessment    Hydrocodone -acetaminophen  (NORCO) 7.5-325 mg every 6 hours as needed for moderate pain (pain score 4-6); MME = 26.25  Monitoring: [place] PMP: PDMP reviewed during this encounter.       Pharmacotherapy: No side-effects or adverse reactions reported. Compliance: No problems identified. Effectiveness: Clinically acceptable.  Shela Reda CROME, RN  04/09/2024  8:27 AM  Sign when Signing Visit Nursing Pain Medication Assessment:  Safety precautions to be maintained throughout the outpatient stay will include: orient to surroundings, keep bed in low position, maintain call bell within reach at all times, provide assistance with transfer out of bed and ambulation.  Medication Inspection Compliance: Pill count conducted under aseptic conditions, in front of the patient. Neither the pills nor the bottle was removed from the patient's sight at any time. Once count was completed pills were immediately returned to the patient in their original bottle.  Medication: Hydrocodone /APAP Pill/Patch Count: 10 of 120 pills/patches remain Pill/Patch Appearance: Markings consistent with prescribed medication Bottle Appearance: Standard pharmacy container. Clearly labeled. Filled Date: 36 / 03 / 2025 Last Medication intake:  Today    UDS:  Summary  Date Value Ref Range Status  10/16/2023 FINAL  Final    Comment:    ==================================================================== [employer] [facility] (MW) ==================================================================== Test                             Result       Flag       Units  Drug Present and Declared for Prescription Verification   Hydrocodone                     907          EXPECTED   ng/mg creat   Hydromorphone                  484          EXPECTED   ng/mg creat   Dihydrocodeine                 299          EXPECTED   ng/mg creat   Norhydrocodone                 2785         EXPECTED   ng/mg creat    Sources of hydrocodone  include scheduled  prescription medications.    Hydromorphone, dihydrocodeine and norhydrocodone are expected    metabolites of hydrocodone . Hydromorphone and dihydrocodeine are    also available as scheduled prescription medications.  ==================================================================== Test                      Result    Flag   Units      Ref Range   Creatinine              74               mg/dL      >=79 ==================================================================== Declared Medications:  The flagging and interpretation on this report are based on the  following declared medications.  Unexpected results may arise from  inaccuracies in the declared medications.   **Note: The testing scope of this panel includes these medications:   Hydrocodone  (Norco)   **Note: The testing scope of this  panel does not include the  following reported medications:   Acetaminophen  (Norco)  Albuterol  (Ventolin  HFA)  Aspirin   Atorvastatin  (Lipitor)  Esomeprazole  (Nexium )  Famotidine  (Pepcid )  Gabapentin  (Neurontin )  Hydrochlorothiazide  (Zestoretic )  Levothyroxine  (Synthroid )  Linaclotide  (Linzess )  Lisinopril  (Zestoretic )  Ondansetron  (Zofran )  Polyethylene Glycol (GlycoLax )  Potassium (Klor-Con )  Rizatriptan  (Maxalt ) ==================================================================== For clinical consultation, please call [phone]. ====================================================================     No results found for: CBDTHCR No results found for: D8THCCBX No results found for: D9THCCBX  ROS  Constitutional: Denies any fever or chills Gastrointestinal: No reported hemesis, hematochezia, vomiting, or acute GI distress Musculoskeletal: Left knee pain Neurological: No reported episodes of acute onset apraxia, aphasia, dysarthria, agnosia, amnesia, paralysis, loss of coordination, or loss of consciousness  Medication Review  HYDROcodone -acetaminophen , albuterol ,  aspirin  EC, atorvastatin , esomeprazole , famotidine , gabapentin , levothyroxine , linaclotide , lisinopril -hydrochlorothiazide , naloxone , ondansetron , polyethylene glycol powder, potassium chloride  SA, and rizatriptan   History Review  Allergy: Ms. Ju has no known allergies. Drug: Ms. Bjorn  reports no history of drug use. Alcohol:  reports no history of alcohol use. Tobacco:  reports that she quit smoking about 5 years ago. Her smoking use included cigarettes. She started smoking about 55 years ago. She has a 25 pack-year smoking history. She has quit using smokeless tobacco. Social: Ms. Wheat  reports that she quit smoking about 5 years ago. Her smoking use included cigarettes. She started smoking about 55 years ago. She has a 25 pack-year smoking history. She has quit using smokeless tobacco. She reports that she does not drink alcohol and does not use drugs. Medical:  has a past medical history of Abnormal EKG, CAD (coronary artery disease), Cancer (HCC), COPD (chronic obstructive pulmonary disease) (HCC), Endometrial adenocarcinoma (HCC) (2011), Hyperlipidemia, Hypertension, Osteoarthritis, Reflux gastritis, Thyroid  disease, tobacco abuse, and Vaginal delivery. Surgical: Ms. Hurless  has a past surgical history that includes Thyroidectomy (2005); Cardiac catheterization (2006); Total abdominal hysterectomy; Breast surgery (Right, March 2014); Breast cyst aspiration (Right, 2014); Esophagogastroduodenoscopy (N/A, 04/07/2020); Esophagogastroduodenoscopy (egd) with propofol  (N/A, 11/07/2022); biopsy (11/07/2022); Balloon dilation (11/07/2022); and polypectomy (11/07/2022). Family: family history includes Alcohol abuse in her father, maternal aunt, and mother; Cancer in her brother, brother, brother, and maternal aunt; Heart disease in her brother.  Laboratory Chemistry Profile   Renal Lab Results  Component Value Date   BUN 20 05/09/2023   CREATININE 1.17 (H) 05/09/2023   BCR 17 05/09/2023    GFR 60.34 01/21/2015   GFRAA 61 01/02/2020   GFRNONAA >60 04/07/2020    Hepatic Lab Results  Component Value Date   AST 11 05/09/2023   ALT 6 05/09/2023   ALBUMIN 3.8 (L) 09/01/2022   ALKPHOS 77 09/01/2022   HCVAB NEGATIVE 01/21/2015   LIPASE 13 (L) 09/01/2022    Electrolytes Lab Results  Component Value Date   NA 140 05/09/2023   K 4.0 05/09/2023   CL 102 05/09/2023   CALCIUM  10.3 05/09/2023   MG 1.3 (L) 09/15/2021    Bone Lab Results  Component Value Date   VD25OH 18 (L) 12/09/2021    Inflammation (CRP: Acute Phase) (ESR: Chronic Phase) Lab Results  Component Value Date   ESRSEDRATE 19 03/30/2012         Note: Above Lab results reviewed.  Recent Imaging Review  DG Knee Complete 4 Views Left CLINICAL DATA:  Left knee pain/arthralgia. Primary osteoarthritis of left knee.  EXAM: LEFT KNEE - COMPLETE 4+ VIEW  COMPARISON:  MRI 09/01/2017 reviewed  FINDINGS: Medial tibiofemoral joint space narrowing. Mild to moderate tricompartmental  peripheral spurring. Subchondral sclerosis in the medial tibiofemoral compartment. No fracture, erosion, or focal bone abnormality. No joint effusion. Peripheral vascular calcifications.  IMPRESSION: Mild to moderate tricompartmental osteoarthritis, most prominent in the medial tibiofemoral compartment.  Electronically Signed   By: Andrea Gasman M.D.   On: 01/14/2024 14:00 Note: Reviewed        Physical Exam  Vitals: BP (!) 120/57 (Cuff Size: Large)   Pulse 91   Temp (!) 97 F (36.1 C) (Temporal)   Resp 18   Ht 5' 8 (1.727 m)   Wt 188 lb (85.3 kg)   SpO2 98%   BMI 28.59 kg/m  BMI: Estimated body mass index is 28.59 kg/m as calculated from the following:   Height as of this encounter: 5' 8 (1.727 m).   Weight as of this encounter: 188 lb (85.3 kg). Ideal: Ideal body weight: 63.9 kg (140 lb 14 oz) Adjusted ideal body weight: 72.5 kg (159 lb 11.6 oz) General appearance: Well nourished, well developed, and well  hydrated. In no apparent acute distress Mental status: Alert, oriented x 3 (person, place, & time)       Respiratory: No evidence of acute respiratory distress Eyes: PERLA  Musculoskeletal: + left knee pain., worse with walking, prolonged standing and weightbearing Assessment   Diagnosis Status  1. Primary osteoarthritis of left knee   2. Medication management   3. Bilateral primary osteoarthritis of knee   4. Spinal stenosis, lumbar region, with neurogenic claudication   5. Chronic pain syndrome   6. Chronic radicular lumbar pain (left)   7. Age-related osteoporosis without current pathological fracture   8. Chronic pain of right ankle   9. 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 Pain radiates to toes, likely due to arthritis. Considering knee surgery but hesitant due to potential pain recurrence. Injection therapy considered as alternative. - Referred to Dr. Lateef for knee injection therapy. - Scheduled follow-up appointment in two weeks for injection.  Chronic pain syndrome Chronic pain in left knee radiating down leg, impacting daily activities. Current hydrocodone  regimen provides limited relief. Exploring knee injection for pain alleviation. - Continue hydrocodone  7.5-325 mg every six hours. - Discussed potential for knee injection to alleviate pain.  Opioid-induced constipation Constipation due to opioid use, partially relieved by Miralax . Financial constraints limit access to Linzess , exploring affordable alternatives. - Continue Miralax  for constipation management. - Explore alternative options for affordable Linzess .   Medication  management: Patient's pain is controlled with hydrocodone , will continue on current medication regiment. Prescribing drug monitoring (PDMP) reviewed; findings consistent with the use of prescribed medication and no evidence of narcotic misuse or abuse.  The patient was advised to continue use over-the-counter MiraLAX  or explore other alternative such as Colace or drink more water to prevent opioid-induced constipation.  Urine drug screening (UDS) up-to-date.  Schedule follow-up in 90 days for medication management.  Plan: (Clinic):(L) Knee ER Kennalog injection # 2 with Dr. Marcelino       Ms. Falan JARIYA REICHOW has a current medication list which includes the following long-term medication(s): albuterol , atorvastatin , esomeprazole , famotidine , gabapentin , levothyroxine , linaclotide , lisinopril -hydrochlorothiazide , potassium chloride  sa, and rizatriptan .  Pharmacotherapy (Medications Ordered): Meds ordered this encounter  Medications   HYDROcodone -acetaminophen  (NORCO) 7.5-325 MG tablet    Sig: Take 1 tablet by mouth every 6 (six) hours as needed for moderate pain (pain score 4-6).    Dispense:  120 tablet    Refill:  0    Fill up one day early.   HYDROcodone -acetaminophen  (NORCO) 7.5-325 MG tablet    Sig: Take 1 tablet by mouth every 6 (six) hours as needed for moderate pain (pain score 4-6).    Dispense:  120 tablet    Refill:  0    Fill up one day early.   HYDROcodone -acetaminophen  (NORCO) 7.5-325 MG tablet    Sig: Take 1 tablet by mouth every 6 (six) hours as needed for moderate pain (pain score 4-6).    Dispense:  120 tablet    Refill:  0    Fill up one day early.   Orders:  Orders Placed This Encounter  Procedures   KNEE INJECTION    Local Anesthetic & Steroid injection.    Standing Status:   Future    Expiration Date:   07/08/2024    Scheduling Instructions:     Procedure: Intra-articular steroid knee injection     Side: Left-sided     Sedation: With Sedation.     Timeframe:  As soon as schedule allows    Where will this procedure be performed?:   [facility]        Return in about 2 weeks (around 04/23/2024) for (Clinic):(L) Knee injection # 2 with Dr. Marcelino.    Recent Visits Date Type Provider Dept  01/17/24 Office Visit Douglas Rooks K, NP [facility]  01/10/24 Office Visit Lynkin Saini K, NP [facility]  Showing recent visits within past 90 days and meeting all other requirements Today's Visits Date Type Provider Dept  04/09/24 Office Visit Dontrell Stuck K, NP [facility]  Showing today's visits and meeting all other requirements Future Appointments Date Type Provider Dept  07/01/24 Appointment Elford Evilsizer K, NP [facility]  Showing future appointments within next 90 days and meeting all other requirements  I discussed the assessment and treatment plan with the patient. The patient was provided an opportunity to ask questions and all were answered. The patient agreed with the plan and demonstrated an understanding of the instructions.  Patient advised to call back or seek an in-person evaluation if the symptoms or condition worsens.  I personally spent a total of 30 minutes in the care of the patient today including preparing to see the patient, getting/reviewing separately obtained history, performing a medically appropriate exam/evaluation, counseling and educating, placing orders, referring and communicating with other health care professionals, documenting clinical information in the EHR, independently interpreting results, communicating results, and coordinating care.   Note by: Kortez Murtagh K Calliope Delangel, NP (TTS and AI technology used. I apologize for any typographical errors that were not detected and corrected.) Date: 04/09/2024; Time: 8:53 AM

## 2024-04-07 NOTE — Progress Notes (Deleted)
 "  Subjective:    Patient ID: Pamela Romero, female    DOB: 1951-01-21, 73 y.o.   MRN: [id number]  HPI  Patient presents to clinic today for follow-up of chronic conditions.  HTN: Her BP today is 94/68.  She is taking lisinopril  HCT as prescribed.  ECG from 05/2010 reviewed.  CKD 3: Her last creatinine was 1.17, GFR 50, 04/2023.  She is on lisinopril  for renal protection.  She does not follow with nephrology.  GERD: Triggered by NSAID use.  She denies has some breakthrough on esomeprazole  and famotidine .  Upper GI from 10/2022 reviewed. She follows with GI.  COPD: She denies chronic cough but she does have intermittent shortness of breath.  She uses albuterol  as needed with good relief of symptoms.  There is no PFTs on file.  She does not follow with pulmonary.  Migraines: Triggered by stress.  These occur 3 times per month.  She takes rizatripatan as needed with good relief of symptoms.  She does not follow with neurology.  Anxiety and depression (moderate, recurrent): Chronic, but she is not currently taking any medications for this. She has been on sertraline  in the past.  She is not seeing a therapist.  She denies SI/HI.  Chronic constipation: Managed with linactolide. There is no recent colonoscopy on file. She follows with GI.  Chronic pain syndrome/OA: Generalized.  Managed with hydrocodone  and gabapentin .  She follows with pain management.  Hypothyroidism: She denies any issues on her current dose of levothyroxine .  She does not follow with endocrinology.  HLD: Her last LDL was 110, triglycerides 113, 04/2023 .  She denies myalgias on atorvastatin .  She tries to consume a low-fat diet.  History of endometrial cancer: Status post hysterectomy and radiation.  She no longer follows with gynecology/oncology.  Insomnia: She has difficulty staying asleep.  She takes gabapentin  with some relief of symptoms.  Sleep study from 12/2019 reviewed.  Osteoporosis: She is not taking any  medication for this at this time.  She does try to get in some weightbearing exercise.  Anemia: Her last H/H was 10.8/34.8, 04/2023. She is not taking any oral iron at this time. She does not follow with hematology.  Review of Systems     Past Medical History:  Diagnosis Date   Abnormal EKG    CAD (coronary artery disease)    non critical, by cardiac cath 2006   Cancer [name])    endometrial Ca   COPD (chronic obstructive pulmonary disease) (HCC)    Endometrial adenocarcinoma (HCC) 2011   Hyperlipidemia    Hypertension    Osteoarthritis    Right foot, Left knee (Dr. Edie)   Reflux gastritis    Thyroid  disease    tobacco abuse    Vaginal delivery    x 2    Current Outpatient Medications  Medication Sig Dispense Refill   albuterol  (PROAIR  HFA) 108 (90 Base) MCG/ACT inhaler Inhale 1-2 puffs into the lungs every 6 (six) hours as needed for wheezing or shortness of breath. 6.7 g 1   aspirin  EC 81 MG tablet Take 1 tablet (81 mg total) by mouth daily. Swallow whole. 30 tablet 12   atorvastatin  (LIPITOR) 10 MG tablet TAKE 1 TABLET BY MOUTH ONCE DAILY 90 tablet 1   esomeprazole  (NEXIUM ) 40 MG capsule Take 1 capsule (40 mg total) by mouth 2 (two) times daily before a meal. 180 capsule 0   famotidine  (PEPCID ) 20 MG tablet Take 1 tablet (20 mg total) by mouth  at bedtime. 90 tablet 1   gabapentin  (NEURONTIN ) 300 MG capsule Take 1-2 capsules (300-600 mg total) by mouth at bedtime. 180 capsule 2   HYDROcodone -acetaminophen  (NORCO) 7.5-325 MG tablet Take 1 tablet by mouth every 6 (six) hours as needed for moderate pain (pain score 4-6). 120 tablet 0   levothyroxine  (SYNTHROID ) 125 MCG tablet Take 1 tablet (125 mcg total) by mouth daily before breakfast. 90 tablet 3   linaclotide  (LINZESS ) 145 MCG CAPS capsule Take 1 capsule (145 mcg total) by mouth daily before breakfast. As needed. 90 capsule 1   lisinopril -hydrochlorothiazide  (ZESTORETIC ) 10-12.5 MG tablet Take 1 tablet by mouth daily. 90  tablet 0   naloxone  (NARCAN ) nasal spray 4 mg/0.1 mL Place 1 spray into the nose as needed for up to 365 doses (for opioid-induced respiratory depresssion). In case of emergency (overdose), spray once into each nostril. If no response within 3 minutes, repeat application and call 911. 1 each 1   ondansetron  (ZOFRAN -ODT) 4 MG disintegrating tablet Take 1 tablet (4 mg total) by mouth daily as needed for nausea or vomiting. 30 tablet 0   polyethylene glycol powder (GLYCOLAX /MIRALAX ) 17 GM/SCOOP powder Take 17 g by mouth daily. 3350 g 1   potassium chloride  SA (KLOR-CON  M) 20 MEQ tablet Take 1 tablet (20 mEq total) by mouth daily. 5 tablet 0   rizatriptan  (MAXALT -MLT) 10 MG disintegrating tablet Take 1 tablet (10 mg total) by mouth as needed for migraine. May repeat in 2 hours if needed 10 tablet 2   No current facility-administered medications for this visit.    No Known Allergies  Family History  Problem Relation Age of Onset   Alcohol abuse Father    Cancer Maternal Aunt        stomach   Alcohol abuse Maternal Aunt    Alcohol abuse Mother    Heart disease Brother        MI age 68- non smoker   Cancer Brother        Pancreatic   Cancer Brother        colon, dx'd age 43   Cancer Brother        stomach, paternal half brother   Breast cancer Neg Hx     Social History   Socioeconomic History   Marital status: Married    Spouse name: Not on file   Number of children: 0   Years of education: Not on file   Highest education level: Not on file  Occupational History   Occupation: retired   Tobacco Use   Smoking status: Former    Current packs/day: 0.00    Average packs/day: 0.5 packs/day for 50.0 years (25.0 ttl pk-yrs)    Types: Cigarettes    Start date: 05/11/1968    Quit date: 05/11/2018    Years since quitting: 5.9   Smokeless tobacco: Former  Building Services Engineer status: Never Used  Substance and Sexual Activity   Alcohol use: No   Drug use: No   Sexual activity: Not on  file  Other Topics Concern   Not on file  Social History Narrative   Lives with spouse, takes care of her mother.   Had 2 children, one died at birth, the other given up for adoption (was unmarried).   Social Drivers of Health   Tobacco Use: Medium Risk (01/16/2024)   Patient History    Smoking Tobacco Use: Former    Smokeless Tobacco Use: Former    Passive Exposure: Not on  file  Financial Resource Strain: Low Risk (04/29/2022)   Overall Financial Resource Strain (CARDIA)    Difficulty of Paying Living Expenses: Not very hard  Food Insecurity: No Food Insecurity (04/29/2022)   Hunger Vital Sign    Worried About Running Out of Food in the Last Year: Never true    Ran Out of Food in the Last Year: Never true  Transportation Needs: No Transportation Needs (04/29/2022)   PRAPARE - Administrator, Civil Service (Medical): No    Lack of Transportation (Non-Medical): No  Physical Activity: Insufficiently Active (04/29/2022)   Exercise Vital Sign    Days of Exercise per Week: 3 days    Minutes of Exercise per Session: 30 min  Stress: No Stress Concern Present (04/29/2022)   [employer] of Occupational Health - Occupational Stress Questionnaire    Feeling of Stress : Only a little  Social Connections: Moderately Integrated (04/29/2022)   Social Connection and Isolation Panel    Frequency of Communication with Friends and Family: Three times a week    Frequency of Social Gatherings with Friends and Family: Twice a week    Attends Religious Services: More than 4 times per year    Active Member of [employer] or Organizations: No    Attends Banker Meetings: Never    Marital Status: Married  Catering Manager Violence: Not At Risk (04/29/2022)   Humiliation, Afraid, Rape, and Kick questionnaire    Fear of Current or Ex-Partner: No    Emotionally Abused: No    Physically Abused: No    Sexually Abused: No  Depression (PHQ2-9): Low Risk (01/10/2024)   Depression (PHQ2-9)     PHQ-2 Score: 0  Alcohol Screen: Low Risk (04/29/2022)   Alcohol Screen    Last Alcohol Screening Score (AUDIT): 0  Housing: Low Risk (04/29/2022)   Housing    Last Housing Risk Score: 0  Utilities: Not At Risk (04/29/2022)   AHC Utilities    Threatened with loss of utilities: No  Health Literacy: Not on file     Constitutional: Patient reports intermittent headaches.  Denies fever, malaise, fatigue, or abrupt weight changes.  HEENT: Denies eye pain, eye redness, ear pain, ringing in the ears, wax buildup, runny nose, nasal congestion, bloody nose, or sore throat. Respiratory: Denies difficulty breathing, shortness of breath, cough or sputum production.   Cardiovascular: Denies chest pain, chest tightness, palpitations or swelling in the hands or feet.  Gastrointestinal: Patient reports constipation.  Denies abdominal pain, bloating, diarrhea or blood in the stool.  GU: Denies urgency, frequency, pain with urination, burning sensation, blood in urine, odor or discharge. Musculoskeletal: Patient reports chronic joint and muscle pain.  Denies decrease in range of motion, difficulty with gait, or joint swelling.  Skin: Denies redness, rashes, lesions or ulcercations.  Neurological: Patient reports insomnia.  Denies dizziness, difficulty with memory, difficulty with speech or problems with balance and coordination.  Psych: Patient has a history of anxiety and depression.  Denies SI/HI.  No other specific complaints in a complete review of systems (except as listed in HPI above).  Objective:   Physical Exam  There were no vitals taken for this visit.   Wt Readings from Last 3 Encounters:  01/10/24 178 lb (80.7 kg)  11/22/23 184 lb (83.5 kg)  11/17/23 182 lb (82.6 kg)    General: Appears her stated age, obese in NAD. Skin: Warm, dry and intact.  Senile purpura noted. HEENT: Head: normal shape and size; Eyes: sclera  white, no icterus, conjunctiva pink, PERRLA and EOMs intact;   Neck:  Neck supple, trachea midline. No masses, lumps or thyromegaly present.  Cardiovascular: Normal rate and rhythm. S1,S2 noted.  No murmur, rubs or gallops noted. No JVD or BLE edema. No carotid bruits noted. Pulmonary/Chest: Normal effort and positive vesicular breath sounds. No respiratory distress. No wheezes, rales or ronchi noted.  Abdomen: Soft and nontender. Normal bowel sounds.  Musculoskeletal: No difficulty with gait.  Neurological: Alert and oriented. Cranial nerves II-XII grossly intact. Coordination normal.  Psychiatric: Mood and affect normal. Behavior is normal. Judgment and thought content normal.     BMET    Component Value Date/Time   NA 140 05/09/2023 1459   NA 142 09/01/2022 1017   K 4.0 05/09/2023 1459   CL 102 05/09/2023 1459   CO2 28 05/09/2023 1459   GLUCOSE 69 05/09/2023 1459   BUN 20 05/09/2023 1459   BUN 22 09/01/2022 1017   CREATININE 1.17 (H) 05/09/2023 1459   CALCIUM  10.3 05/09/2023 1459   GFRNONAA >60 04/07/2020 1022   GFRNONAA 52 (L) 01/02/2020 1453   GFRAA 61 01/02/2020 1453    Lipid Panel     Component Value Date/Time   CHOL 189 05/09/2023 1459   TRIG 113 05/09/2023 1459   HDL 57 05/09/2023 1459   CHOLHDL 3.3 05/09/2023 1459   VLDL 42.0 (H) 05/14/2014 0918   LDLCALC 110 (H) 05/09/2023 1459    CBC    Component Value Date/Time   WBC 7.1 05/09/2023 1459   RBC 3.64 (L) 05/09/2023 1459   HGB 10.8 (L) 05/09/2023 1459   HGB 10.1 (L) 09/01/2022 1017   HCT 34.8 (L) 05/09/2023 1459   HCT 31.4 (L) 09/01/2022 1017   PLT 194 05/09/2023 1459   MCV 95.6 05/09/2023 1459   MCV 94 09/01/2022 1017   MCH 29.7 05/09/2023 1459   MCHC 31.0 (L) 05/09/2023 1459   RDW 13.7 05/09/2023 1459   RDW 13.3 09/01/2022 1017   LYMPHSABS 2,125 10/28/2022 1137   LYMPHSABS 1.7 09/01/2022 1017   MONOABS 0.4 09/16/2013 1046   EOSABS 59 10/28/2022 1137   EOSABS 0.1 09/01/2022 1017   BASOSABS 40 10/28/2022 1137   BASOSABS 0.1 09/01/2022 1017    Hgb  A1C Lab Results  Component Value Date   HGBA1C 5.1 05/09/2023           Assessment & Plan:     RTC in 6 months for your annual exam. Angeline Laura, NP   "

## 2024-04-08 ENCOUNTER — Ambulatory Visit: Admitting: Internal Medicine

## 2024-04-08 DIAGNOSIS — F331 Major depressive disorder, recurrent, moderate: Secondary | ICD-10-CM

## 2024-04-09 ENCOUNTER — Ambulatory Visit: Admitting: Nurse Practitioner

## 2024-04-09 ENCOUNTER — Encounter: Payer: Self-pay | Admitting: Nurse Practitioner

## 2024-04-09 VITALS — BP 120/57 | HR 91 | Temp 97.0°F | Resp 18 | Ht 68.0 in | Wt 188.0 lb

## 2024-04-09 DIAGNOSIS — M48062 Spinal stenosis, lumbar region with neurogenic claudication: Secondary | ICD-10-CM | POA: Insufficient documentation

## 2024-04-09 DIAGNOSIS — M1712 Unilateral primary osteoarthritis, left knee: Secondary | ICD-10-CM | POA: Insufficient documentation

## 2024-04-09 DIAGNOSIS — M25571 Pain in right ankle and joints of right foot: Secondary | ICD-10-CM | POA: Diagnosis present

## 2024-04-09 DIAGNOSIS — G8929 Other chronic pain: Secondary | ICD-10-CM | POA: Insufficient documentation

## 2024-04-09 DIAGNOSIS — Z79899 Other long term (current) drug therapy: Secondary | ICD-10-CM | POA: Insufficient documentation

## 2024-04-09 DIAGNOSIS — M170 Bilateral primary osteoarthritis of knee: Secondary | ICD-10-CM | POA: Insufficient documentation

## 2024-04-09 DIAGNOSIS — M722 Plantar fascial fibromatosis: Secondary | ICD-10-CM | POA: Insufficient documentation

## 2024-04-09 DIAGNOSIS — G894 Chronic pain syndrome: Secondary | ICD-10-CM | POA: Diagnosis present

## 2024-04-09 DIAGNOSIS — M5416 Radiculopathy, lumbar region: Secondary | ICD-10-CM | POA: Insufficient documentation

## 2024-04-09 DIAGNOSIS — M810 Age-related osteoporosis without current pathological fracture: Secondary | ICD-10-CM | POA: Insufficient documentation

## 2024-04-09 MED ORDER — HYDROCODONE-ACETAMINOPHEN 7.5-325 MG PO TABS: 1.0000 | ORAL_TABLET | Freq: Four times a day (QID) | ORAL | 0 refills | Status: AC | PRN

## 2024-04-09 NOTE — Progress Notes (Signed)
 Nursing Pain Medication Assessment:  Safety precautions to be maintained throughout the outpatient stay will include: orient to surroundings, keep bed in low position, maintain call bell within reach at all times, provide assistance with transfer out of bed and ambulation.  Medication Inspection Compliance: Pill count conducted under aseptic conditions, in front of the patient. Neither the pills nor the bottle was removed from the patient's sight at any time. Once count was completed pills were immediately returned to the patient in their original bottle.  Medication: Hydrocodone /APAP Pill/Patch Count: 10 of 120 pills/patches remain Pill/Patch Appearance: Markings consistent with prescribed medication Bottle Appearance: Standard pharmacy container. Clearly labeled. Filled Date: 40 / 03 / 2025 Last Medication intake:  Today

## 2024-04-16 ENCOUNTER — Ambulatory Visit: Admitting: Internal Medicine

## 2024-04-16 NOTE — Progress Notes (Deleted)
 "  Subjective:    Patient ID: Pamela Romero, female    DOB: 11-04-1950, 74 y.o.   MRN: [id number]  HPI  Patient presents to clinic today for follow-up of chronic conditions.  HTN: Her BP today is 94/68.  She is taking lisinopril  HCT as prescribed.  ECG from 05/2010 reviewed.  CKD 3: Her last creatinine was 1.17, GFR 50, 04/2023.  She is on lisinopril  for renal protection.  She does not follow with nephrology.  GERD: Triggered by NSAID use.  She denies has some breakthrough on esomeprazole  and famotidine .  Upper GI from 10/2022 reviewed. She follows with GI.  COPD: She denies chronic cough but she does have intermittent shortness of breath.  She uses albuterol  as needed with good relief of symptoms.  There is no PFTs on file.  She does not follow with pulmonary.  Migraines: Triggered by stress.  These occur 3 times per month.  She takes rizatripatan as needed with good relief of symptoms.  She does not follow with neurology.  Anxiety and depression (moderate, recurrent): Chronic, but she is not currently taking any medications for this. She has been on sertraline  in the past.  She is not seeing a therapist.  She denies SI/HI.  Chronic constipation: Managed with linactolide. There is no recent colonoscopy on file. She follows with GI.  Chronic pain syndrome/OA: Generalized.  Managed with hydrocodone  and gabapentin .  She follows with pain management.  Hypothyroidism: She denies any issues on her current dose of levothyroxine .  She does not follow with endocrinology.  HLD: Her last LDL was 110, triglycerides 113, 04/2023 .  She denies myalgias on atorvastatin .  She tries to consume a low-fat diet.  History of endometrial cancer: Status post hysterectomy and radiation.  She no longer follows with gynecology/oncology.  Insomnia: She has difficulty staying asleep.  She takes gabapentin  with some relief of symptoms.  Sleep study from 12/2019 reviewed.  Osteoporosis: She is not taking any  medication for this at this time.  She does try to get in some weightbearing exercise.  Anemia: Her last H/H was 10.8/34.8, 04/2023. She is not taking any oral iron at this time. She does not follow with hematology.  Review of Systems     Past Medical History:  Diagnosis Date   Abnormal EKG    CAD (coronary artery disease)    non critical, by cardiac cath 2006   Cancer [facility])    endometrial Ca   COPD (chronic obstructive pulmonary disease) (HCC)    Endometrial adenocarcinoma (HCC) 2011   Hyperlipidemia    Hypertension    Osteoarthritis    Right foot, Left knee (Dr. Edie)   Reflux gastritis    Thyroid  disease    tobacco abuse    Vaginal delivery    x 2    Current Outpatient Medications  Medication Sig Dispense Refill   albuterol  (PROAIR  HFA) 108 (90 Base) MCG/ACT inhaler Inhale 1-2 puffs into the lungs every 6 (six) hours as needed for wheezing or shortness of breath. 6.7 g 1   aspirin  EC 81 MG tablet Take 1 tablet (81 mg total) by mouth daily. Swallow whole. 30 tablet 12   atorvastatin  (LIPITOR) 10 MG tablet TAKE 1 TABLET BY MOUTH ONCE DAILY 90 tablet 1   esomeprazole  (NEXIUM ) 40 MG capsule Take 1 capsule (40 mg total) by mouth 2 (two) times daily before a meal. 180 capsule 0   famotidine  (PEPCID ) 20 MG tablet Take 1 tablet (20 mg total) by mouth  at bedtime. 90 tablet 1   gabapentin  (NEURONTIN ) 300 MG capsule Take 1-2 capsules (300-600 mg total) by mouth at bedtime. 180 capsule 2   HYDROcodone -acetaminophen  (NORCO) 7.5-325 MG tablet Take 1 tablet by mouth every 6 (six) hours as needed for moderate pain (pain score 4-6). 120 tablet 0   [START ON 05/12/2024] HYDROcodone -acetaminophen  (NORCO) 7.5-325 MG tablet Take 1 tablet by mouth every 6 (six) hours as needed for moderate pain (pain score 4-6). 120 tablet 0   [START ON 06/11/2024] HYDROcodone -acetaminophen  (NORCO) 7.5-325 MG tablet Take 1 tablet by mouth every 6 (six) hours as needed for moderate pain (pain score 4-6). 120 tablet 0    levothyroxine  (SYNTHROID ) 125 MCG tablet Take 1 tablet (125 mcg total) by mouth daily before breakfast. 90 tablet 3   linaclotide  (LINZESS ) 145 MCG CAPS capsule Take 1 capsule (145 mcg total) by mouth daily before breakfast. As needed. 90 capsule 1   lisinopril -hydrochlorothiazide  (ZESTORETIC ) 10-12.5 MG tablet Take 1 tablet by mouth daily. 90 tablet 0   naloxone  (NARCAN ) nasal spray 4 mg/0.1 mL Place 1 spray into the nose as needed for up to 365 doses (for opioid-induced respiratory depresssion). In case of emergency (overdose), spray once into each nostril. If no response within 3 minutes, repeat application and call 911. 1 each 1   ondansetron  (ZOFRAN -ODT) 4 MG disintegrating tablet Take 1 tablet (4 mg total) by mouth daily as needed for nausea or vomiting. 30 tablet 0   polyethylene glycol powder (GLYCOLAX /MIRALAX ) 17 GM/SCOOP powder Take 17 g by mouth daily. 3350 g 1   potassium chloride  SA (KLOR-CON  M) 20 MEQ tablet Take 1 tablet (20 mEq total) by mouth daily. 5 tablet 0   rizatriptan  (MAXALT -MLT) 10 MG disintegrating tablet Take 1 tablet (10 mg total) by mouth as needed for migraine. May repeat in 2 hours if needed 10 tablet 2   No current facility-administered medications for this visit.    No Known Allergies  Family History  Problem Relation Age of Onset   Alcohol abuse Father    Cancer Maternal Aunt        stomach   Alcohol abuse Maternal Aunt    Alcohol abuse Mother    Heart disease Brother        MI age 2- non smoker   Cancer Brother        Pancreatic   Cancer Brother        colon, dx'd age 51   Cancer Brother        stomach, paternal half brother   Breast cancer Neg Hx     Social History   Socioeconomic History   Marital status: Married    Spouse name: Not on file   Number of children: 0   Years of education: Not on file   Highest education level: Not on file  Occupational History   Occupation: retired   Tobacco Use   Smoking status: Former    Current  packs/day: 0.00    Average packs/day: 0.5 packs/day for 50.0 years (25.0 ttl pk-yrs)    Types: Cigarettes    Start date: 05/11/1968    Quit date: 05/11/2018    Years since quitting: 5.9   Smokeless tobacco: Former  Building Services Engineer status: Never Used  Substance and Sexual Activity   Alcohol use: No   Drug use: No   Sexual activity: Not on file  Other Topics Concern   Not on file  Social History Narrative   Lives with spouse,  takes care of her mother.   Had 2 children, one died at birth, the other given up for adoption (was unmarried).   Social Drivers of Health   Tobacco Use: Medium Risk (04/09/2024)   Patient History    Smoking Tobacco Use: Former    Smokeless Tobacco Use: Former    Passive Exposure: Not on Actuary Strain: Low Risk (04/29/2022)   Overall Financial Resource Strain (CARDIA)    Difficulty of Paying Living Expenses: Not very hard  Food Insecurity: No Food Insecurity (04/29/2022)   Hunger Vital Sign    Worried About Running Out of Food in the Last Year: Never true    Ran Out of Food in the Last Year: Never true  Transportation Needs: No Transportation Needs (04/29/2022)   PRAPARE - Administrator, Civil Service (Medical): No    Lack of Transportation (Non-Medical): No  Physical Activity: Insufficiently Active (04/29/2022)   Exercise Vital Sign    Days of Exercise per Week: 3 days    Minutes of Exercise per Session: 30 min  Stress: No Stress Concern Present (04/29/2022)   [employer] of Occupational Health - Occupational Stress Questionnaire    Feeling of Stress : Only a little  Social Connections: Moderately Integrated (04/29/2022)   Social Connection and Isolation Panel    Frequency of Communication with Friends and Family: Three times a week    Frequency of Social Gatherings with Friends and Family: Twice a week    Attends Religious Services: More than 4 times per year    Active Member of [employer] or Organizations: No     Attends Banker Meetings: Never    Marital Status: Married  Catering Manager Violence: Not At Risk (04/29/2022)   Humiliation, Afraid, Rape, and Kick questionnaire    Fear of Current or Ex-Partner: No    Emotionally Abused: No    Physically Abused: No    Sexually Abused: No  Depression (PHQ2-9): Low Risk (04/09/2024)   Depression (PHQ2-9)    PHQ-2 Score: 0  Alcohol Screen: Low Risk (04/29/2022)   Alcohol Screen    Last Alcohol Screening Score (AUDIT): 0  Housing: Low Risk (04/29/2022)   Housing    Last Housing Risk Score: 0  Utilities: Not At Risk (04/29/2022)   AHC Utilities    Threatened with loss of utilities: No  Health Literacy: Not on file     Constitutional: Patient reports intermittent headaches.  Denies fever, malaise, fatigue, or abrupt weight changes.  HEENT: Denies eye pain, eye redness, ear pain, ringing in the ears, wax buildup, runny nose, nasal congestion, bloody nose, or sore throat. Respiratory: Denies difficulty breathing, shortness of breath, cough or sputum production.   Cardiovascular: Denies chest pain, chest tightness, palpitations or swelling in the hands or feet.  Gastrointestinal: Patient reports constipation.  Denies abdominal pain, bloating, diarrhea or blood in the stool.  GU: Denies urgency, frequency, pain with urination, burning sensation, blood in urine, odor or discharge. Musculoskeletal: Patient reports chronic joint and muscle pain.  Denies decrease in range of motion, difficulty with gait, or joint swelling.  Skin: Denies redness, rashes, lesions or ulcercations.  Neurological: Patient reports insomnia.  Denies dizziness, difficulty with memory, difficulty with speech or problems with balance and coordination.  Psych: Patient has a history of anxiety and depression.  Denies SI/HI.  No other specific complaints in a complete review of systems (except as listed in HPI above).  Objective:   Physical Exam  There were  no vitals taken  for this visit.   Wt Readings from Last 3 Encounters:  04/09/24 188 lb (85.3 kg)  01/10/24 178 lb (80.7 kg)  11/22/23 184 lb (83.5 kg)    General: Appears her stated age, obese in NAD. Skin: Warm, dry and intact.  Senile purpura noted. HEENT: Head: normal shape and size; Eyes: sclera white, no icterus, conjunctiva pink, PERRLA and EOMs intact;  Neck:  Neck supple, trachea midline. No masses, lumps or thyromegaly present.  Cardiovascular: Normal rate and rhythm. S1,S2 noted.  No murmur, rubs or gallops noted. No JVD or BLE edema. No carotid bruits noted. Pulmonary/Chest: Normal effort and positive vesicular breath sounds. No respiratory distress. No wheezes, rales or ronchi noted.  Abdomen: Soft and nontender. Normal bowel sounds.  Musculoskeletal: No difficulty with gait.  Neurological: Alert and oriented. Cranial nerves II-XII grossly intact. Coordination normal.  Psychiatric: Mood and affect normal. Behavior is normal. Judgment and thought content normal.     BMET    Component Value Date/Time   NA 140 05/09/2023 1459   NA 142 09/01/2022 1017   K 4.0 05/09/2023 1459   CL 102 05/09/2023 1459   CO2 28 05/09/2023 1459   GLUCOSE 69 05/09/2023 1459   BUN 20 05/09/2023 1459   BUN 22 09/01/2022 1017   CREATININE 1.17 (H) 05/09/2023 1459   CALCIUM  10.3 05/09/2023 1459   GFRNONAA >60 04/07/2020 1022   GFRNONAA 52 (L) 01/02/2020 1453   GFRAA 61 01/02/2020 1453    Lipid Panel     Component Value Date/Time   CHOL 189 05/09/2023 1459   TRIG 113 05/09/2023 1459   HDL 57 05/09/2023 1459   CHOLHDL 3.3 05/09/2023 1459   VLDL 42.0 (H) 05/14/2014 0918   LDLCALC 110 (H) 05/09/2023 1459    CBC    Component Value Date/Time   WBC 7.1 05/09/2023 1459   RBC 3.64 (L) 05/09/2023 1459   HGB 10.8 (L) 05/09/2023 1459   HGB 10.1 (L) 09/01/2022 1017   HCT 34.8 (L) 05/09/2023 1459   HCT 31.4 (L) 09/01/2022 1017   PLT 194 05/09/2023 1459   MCV 95.6 05/09/2023 1459   MCV 94 09/01/2022  1017   MCH 29.7 05/09/2023 1459   MCHC 31.0 (L) 05/09/2023 1459   RDW 13.7 05/09/2023 1459   RDW 13.3 09/01/2022 1017   LYMPHSABS 2,125 10/28/2022 1137   LYMPHSABS 1.7 09/01/2022 1017   MONOABS 0.4 09/16/2013 1046   EOSABS 59 10/28/2022 1137   EOSABS 0.1 09/01/2022 1017   BASOSABS 40 10/28/2022 1137   BASOSABS 0.1 09/01/2022 1017    Hgb A1C Lab Results  Component Value Date   HGBA1C 5.1 05/09/2023           Assessment & Plan:     RTC in 6 months for your annual exam. Angeline Laura, NP   "

## 2024-04-19 ENCOUNTER — Encounter: Payer: Self-pay | Admitting: Internal Medicine

## 2024-04-19 ENCOUNTER — Telehealth: Payer: Self-pay

## 2024-04-19 ENCOUNTER — Ambulatory Visit (INDEPENDENT_AMBULATORY_CARE_PROVIDER_SITE_OTHER): Admitting: Internal Medicine

## 2024-04-19 ENCOUNTER — Ambulatory Visit: Admitting: Internal Medicine

## 2024-04-19 VITALS — BP 104/68 | Ht 68.0 in | Wt 186.4 lb

## 2024-04-19 DIAGNOSIS — Z8542 Personal history of malignant neoplasm of other parts of uterus: Secondary | ICD-10-CM | POA: Insufficient documentation

## 2024-04-19 DIAGNOSIS — G43909 Migraine, unspecified, not intractable, without status migrainosus: Secondary | ICD-10-CM

## 2024-04-19 DIAGNOSIS — M170 Bilateral primary osteoarthritis of knee: Secondary | ICD-10-CM

## 2024-04-19 DIAGNOSIS — Z23 Encounter for immunization: Secondary | ICD-10-CM | POA: Diagnosis not present

## 2024-04-19 DIAGNOSIS — J449 Chronic obstructive pulmonary disease, unspecified: Secondary | ICD-10-CM

## 2024-04-19 DIAGNOSIS — F331 Major depressive disorder, recurrent, moderate: Secondary | ICD-10-CM | POA: Diagnosis not present

## 2024-04-19 DIAGNOSIS — J410 Simple chronic bronchitis: Secondary | ICD-10-CM

## 2024-04-19 DIAGNOSIS — D631 Anemia in chronic kidney disease: Secondary | ICD-10-CM

## 2024-04-19 DIAGNOSIS — M810 Age-related osteoporosis without current pathological fracture: Secondary | ICD-10-CM | POA: Diagnosis not present

## 2024-04-19 DIAGNOSIS — N1831 Chronic kidney disease, stage 3a: Secondary | ICD-10-CM | POA: Diagnosis not present

## 2024-04-19 DIAGNOSIS — K5903 Drug induced constipation: Secondary | ICD-10-CM

## 2024-04-19 DIAGNOSIS — F5104 Psychophysiologic insomnia: Secondary | ICD-10-CM

## 2024-04-19 DIAGNOSIS — G894 Chronic pain syndrome: Secondary | ICD-10-CM

## 2024-04-19 DIAGNOSIS — E890 Postprocedural hypothyroidism: Secondary | ICD-10-CM | POA: Diagnosis not present

## 2024-04-19 DIAGNOSIS — K2100 Gastro-esophageal reflux disease with esophagitis, without bleeding: Secondary | ICD-10-CM | POA: Diagnosis not present

## 2024-04-19 DIAGNOSIS — F411 Generalized anxiety disorder: Secondary | ICD-10-CM

## 2024-04-19 DIAGNOSIS — E785 Hyperlipidemia, unspecified: Secondary | ICD-10-CM

## 2024-04-19 DIAGNOSIS — R739 Hyperglycemia, unspecified: Secondary | ICD-10-CM | POA: Diagnosis not present

## 2024-04-19 MED ORDER — TRELEGY ELLIPTA 100-62.5-25 MCG/ACT IN AEPB: 1.0000 | INHALATION_SPRAY | Freq: Every day | RESPIRATORY_TRACT | 11 refills | Status: DC

## 2024-04-19 NOTE — Assessment & Plan Note (Signed)
Not medicated Support offered 

## 2024-04-19 NOTE — Assessment & Plan Note (Signed)
 Encouraged high fiber diet and adequate water intake She is having difficulty affording linaclotide  145 mcg daily, will reach out to clinical pharmacist to request help with this

## 2024-04-19 NOTE — Assessment & Plan Note (Signed)
 Will trial trelegy 100-62.5-25, 1 inhalation daily Encouraged her to rinse her mouth after use Continue albuterol  108 mcg per actuation every 4 hours as needed for shortness of breath

## 2024-04-19 NOTE — Assessment & Plan Note (Signed)
 Will consider taking HCTZ out of her lisinopril  due to CKD pending labs Reinforced diet and exercise weight loss C-Met today

## 2024-04-19 NOTE — Patient Instructions (Signed)
 GERD in Adults: Diet Changes When you have gastroesophageal reflux disease (GERD), you may need to make changes to your diet. Choosing the right foods can help with your symptoms. Think about working with an expert in healthy eating called a dietitian. They can help you make healthy food choices. What are tips for following this plan? Reading food labels Look for foods that are low in saturated fat. Foods that may help with your symptoms include: Foods with less than 5% of daily value (DV) of fat. Foods with 0 grams of trans fat. Cooking [employer] in ways that don't use a lot of fat. These ways include: Baking. Steaming. Grilling. Broiling. To add flavor, try to use herbs that are low in spice and acidity. Avoid frying your food. Meal planning  Eat small meals often rather than eating 3 large meals each day. Eat your meals slowly in a place where you feel relaxed. If told by your health care provider, avoid: Foods that cause symptoms. Keep a food diary to keep track of foods that cause symptoms. Alcohol. Drinking a lot of liquid with meals. General instructions For 2-3 hours after you eat, avoid: Bending over. Exercise. Lying down. Chew sugar-free gum after meals. What foods should I eat? Eat a healthy diet. Try to include: Foods with high amounts of fiber. These include: Fruits and vegetables. Whole grains and beans. Low-fat dairy products. Lean meats, fish, and poultry. Egg whites. Foods that cause symptoms in someone else may not cause symptoms for you. Work with your provider to find foods that are safe for you. The items listed above may not be all the foods and drinks you can have. Talk with a dietitian to learn more. The items listed above may not be a complete list of foods and beverages you can eat and drink. Contact a dietitian for more information. What foods should I avoid? Limiting some of these foods may help with your symptoms. Each person is different.  Talk with a dietitian or your provider to help you find the exact foods to avoid. Some of the foods to avoid may include: Fruits Fruits with a lot of acid in them. These may include citrus fruits, such as oranges, grapefruit, pineapple, and lemons. Vegetables Deep-fried vegetables, such as [place] fries. Vegetables, sauces, or toppings made with added fat and vegetables with acid in them. These may include tomatoes and tomato products, chili peppers, onions, garlic, and horseradish. Grains Pastries or quick breads with added fat. Meats and other proteins High-fat meats, such as fatty beef or pork, hot dogs, ribs, ham, sausage, salami, and bacon. Fried meat or protein, such as fried fish and fried chicken. Egg yolks. Fats and oils Butter. Margarine. Shortening. Ghee. Drinks Coffee and other drinks with caffeine in them. Fizzy and sugary drinks, such as soda and energy drinks. Fruit juice made with acidic fruits, such as orange or grapefruit. Tomato juice. Sweets and desserts Chocolate and cocoa. Donuts. Seasonings and condiments Mint, such as peppermint and spearmint. Condiments, herbs, or seasonings that cause symptoms. These may include curry, hot sauce, or vinegar-based salad dressings. The items listed above may not be all the foods and drinks you should avoid. Talk with a dietitian to learn more. Questions to ask your health care provider Changes to your diet and everyday life are often the first steps taken to manage symptoms of GERD. If these changes don't help, talk with your provider about taking medicines. Where to find more information International Foundation for Gastrointestinal Disorders:  aboutgerd.org This information is not intended to replace advice given to you by your health care provider. Make sure you discuss any questions you have with your health care provider. Document Revised: 02/07/2023 Document Reviewed: 08/24/2022 Elsevier Patient Education  2024 [employer].

## 2024-04-19 NOTE — Telephone Encounter (Signed)
 Copied from CRM #[id number]. Topic: Clinical - Prescription Issue >> Apr 19, 2024 12:06 PM Wess RAMAN wrote: Reason for CRM: Patient stated her Fluticasone-Umeclidin-Vilant (TRELEGY ELLIPTA ) 100-62.5-25 MCG/ACT AEP was too expensive and was well over $200.  Callback #: [id number]  Ph[facility]: [facility] - [place], [place] - [address] [place] [address] Phone: [phone] Fax: [phone] Hours: Not open 24 hours

## 2024-04-19 NOTE — Assessment & Plan Note (Signed)
 CBC today.

## 2024-04-19 NOTE — Assessment & Plan Note (Signed)
 CMET today Encourage adequate water intake and avoid anti-inflammatories OTC Continue lisinopril  10 mg for renal protection I am considering discontinuing the HCTZ 12.5 mg out of her combined blood pressure pill pending labs

## 2024-04-19 NOTE — Assessment & Plan Note (Signed)
 Continue gabapentin  300 to 600 mg at bedtime Will monitor

## 2024-04-19 NOTE — Progress Notes (Signed)
 "  Subjective:    Patient ID: Pamela Romero, female    DOB: 20-Oct-1950, 74 y.o.   MRN: [id number]  HPI  Patient presents to clinic today for follow-up of chronic conditions.  HTN: Her BP today is 104/68.  She is taking lisinopril  HCT as prescribed. She does feel dizzy upon standing. ECG from 05/2010 reviewed.  CKD 3: Her last creatinine was 1.17, GFR 50, 04/2023.  She is on lisinopril  for renal protection.  She does not follow with nephrology.  GERD: Triggered by NSAID use.  She denies has some breakthrough on esomeprazole . She is not taking famotidine . She does drink milk for breakthrough with good relief of symptoms.  Upper GI from 10/2022 reviewed. She follows with GI.  COPD: She reports chronic cough and does have intermittent shortness of breath.  She uses albuterol  as needed with good relief of symptoms.  There is no PFTs on file. She does not smoke.  She does not follow with pulmonary.  Migraines: Triggered by stress.  These occur 5 times per month.  She takes rizatripatan as needed with good relief of symptoms.  She does not follow with neurology.  Anxiety and depression (moderate, recurrent): Chronic, but she is not currently taking any medications for this. She has been on sertraline  in the past.  She is not seeing a therapist.  She denies SI/HI.  Chronic constipation: She is not taking linactolide as prescribed due to financial reasons. There is no recent colonoscopy on file. She follows with GI.  Chronic pain syndrome/OA: Worse in her back and knees.  Managed with hydrocodone  and gabapentin .  She follows with pain management.  Hypothyroidism: She denies any issues on her current dose of levothyroxine .  She does not follow with endocrinology.  HLD: Her last LDL was 110, triglycerides 113, 04/2023 .  She denies myalgias on atorvastatin .  She tries to consume a low-fat diet.  History of endometrial cancer: Status post hysterectomy and radiation.  She no longer follows with  gynecology/oncology.  Insomnia: She has difficulty staying asleep.  She takes gabapentin  with some relief of symptoms.  Sleep study from 12/2019 reviewed.  Osteoporosis: She is not taking any medication for this at this time including calcium  and vit d.  She does try to get in some weightbearing exercise. Bone density from 11/2019 reviewed.  Anemia: Her last H/H was 10.8/34.8, 04/2023. She is not taking any oral iron at this time. She does not follow with hematology.  Review of Systems     Past Medical History:  Diagnosis Date   Abnormal EKG    CAD (coronary artery disease)    non critical, by cardiac cath 2006   Cancer [facility])    endometrial Ca   COPD (chronic obstructive pulmonary disease) (HCC)    Endometrial adenocarcinoma (HCC) 2011   Hyperlipidemia    Hypertension    Osteoarthritis    Right foot, Left knee (Dr. Edie)   Reflux gastritis    Thyroid  disease    tobacco abuse    Vaginal delivery    x 2    Current Outpatient Medications  Medication Sig Dispense Refill   albuterol  (PROAIR  HFA) 108 (90 Base) MCG/ACT inhaler Inhale 1-2 puffs into the lungs every 6 (six) hours as needed for wheezing or shortness of breath. 6.7 g 1   aspirin  EC 81 MG tablet Take 1 tablet (81 mg total) by mouth daily. Swallow whole. 30 tablet 12   atorvastatin  (LIPITOR) 10 MG tablet TAKE 1 TABLET BY MOUTH  ONCE DAILY 90 tablet 1   esomeprazole  (NEXIUM ) 40 MG capsule Take 1 capsule (40 mg total) by mouth 2 (two) times daily before a meal. 180 capsule 0   famotidine  (PEPCID ) 20 MG tablet Take 1 tablet (20 mg total) by mouth at bedtime. 90 tablet 1   gabapentin  (NEURONTIN ) 300 MG capsule Take 1-2 capsules (300-600 mg total) by mouth at bedtime. 180 capsule 2   HYDROcodone -acetaminophen  (NORCO) 7.5-325 MG tablet Take 1 tablet by mouth every 6 (six) hours as needed for moderate pain (pain score 4-6). 120 tablet 0   levothyroxine  (SYNTHROID ) 125 MCG tablet Take 1 tablet (125 mcg total) by mouth daily before  breakfast. 90 tablet 3   linaclotide  (LINZESS ) 145 MCG CAPS capsule Take 1 capsule (145 mcg total) by mouth daily before breakfast. As needed. 90 capsule 1   lisinopril -hydrochlorothiazide  (ZESTORETIC ) 10-12.5 MG tablet Take 1 tablet by mouth daily. 90 tablet 0   naloxone  (NARCAN ) nasal spray 4 mg/0.1 mL Place 1 spray into the nose as needed for up to 365 doses (for opioid-induced respiratory depresssion). In case of emergency (overdose), spray once into each nostril. If no response within 3 minutes, repeat application and call 911. 1 each 1   ondansetron  (ZOFRAN -ODT) 4 MG disintegrating tablet Take 1 tablet (4 mg total) by mouth daily as needed for nausea or vomiting. 30 tablet 0   polyethylene glycol powder (GLYCOLAX /MIRALAX ) 17 GM/SCOOP powder Take 17 g by mouth daily. 3350 g 1   potassium chloride  SA (KLOR-CON  M) 20 MEQ tablet Take 1 tablet (20 mEq total) by mouth daily. 5 tablet 0   rizatriptan  (MAXALT -MLT) 10 MG disintegrating tablet Take 1 tablet (10 mg total) by mouth as needed for migraine. May repeat in 2 hours if needed 10 tablet 2   No current facility-administered medications for this visit.    No Known Allergies  Family History  Problem Relation Age of Onset   Alcohol abuse Father    Cancer Maternal Aunt        stomach   Alcohol abuse Maternal Aunt    Alcohol abuse Mother    Heart disease Brother        MI age 63- non smoker   Cancer Brother        Pancreatic   Cancer Brother        colon, dx'd age 73   Cancer Brother        stomach, paternal half brother   Breast cancer Neg Hx     Social History   Socioeconomic History   Marital status: Married    Spouse name: Not on file   Number of children: 0   Years of education: Not on file   Highest education level: Not on file  Occupational History   Occupation: retired   Tobacco Use   Smoking status: Former    Current packs/day: 0.00    Average packs/day: 0.5 packs/day for 50.0 years (25.0 ttl pk-yrs)    Types:  Cigarettes    Start date: 05/11/1968    Quit date: 05/11/2018    Years since quitting: 5.9   Smokeless tobacco: Former  Building Services Engineer status: Never Used  Substance and Sexual Activity   Alcohol use: No   Drug use: No   Sexual activity: Not on file  Other Topics Concern   Not on file  Social History Narrative   Lives with spouse, takes care of her mother.   Had 2 children, one died at birth, the  other given up for adoption (was unmarried).   Social Drivers of Health   Tobacco Use: Medium Risk (01/16/2024)   Patient History    Smoking Tobacco Use: Former    Smokeless Tobacco Use: Former    Passive Exposure: Not on Actuary Strain: Low Risk (04/29/2022)   Overall Financial Resource Strain (CARDIA)    Difficulty of Paying Living Expenses: Not very hard  Food Insecurity: No Food Insecurity (04/29/2022)   Hunger Vital Sign    Worried About Running Out of Food in the Last Year: Never true    Ran Out of Food in the Last Year: Never true  Transportation Needs: No Transportation Needs (04/29/2022)   PRAPARE - Administrator, Civil Service (Medical): No    Lack of Transportation (Non-Medical): No  Physical Activity: Insufficiently Active (04/29/2022)   Exercise Vital Sign    Days of Exercise per Week: 3 days    Minutes of Exercise per Session: 30 min  Stress: No Stress Concern Present (04/29/2022)   [employer] of Occupational Health - Occupational Stress Questionnaire    Feeling of Stress : Only a little  Social Connections: Moderately Integrated (04/29/2022)   Social Connection and Isolation Panel    Frequency of Communication with Friends and Family: Three times a week    Frequency of Social Gatherings with Friends and Family: Twice a week    Attends Religious Services: More than 4 times per year    Active Member of [employer] or Organizations: No    Attends Banker Meetings: Never    Marital Status: Married  Catering Manager Violence:  Not At Risk (04/29/2022)   Humiliation, Afraid, Rape, and Kick questionnaire    Fear of Current or Ex-Partner: No    Emotionally Abused: No    Physically Abused: No    Sexually Abused: No  Depression (PHQ2-9): Low Risk (01/10/2024)   Depression (PHQ2-9)    PHQ-2 Score: 0  Alcohol Screen: Low Risk (04/29/2022)   Alcohol Screen    Last Alcohol Screening Score (AUDIT): 0  Housing: Low Risk (04/29/2022)   Housing    Last Housing Risk Score: 0  Utilities: Not At Risk (04/29/2022)   AHC Utilities    Threatened with loss of utilities: No  Health Literacy: Not on file     Constitutional: Patient reports intermittent headaches.  Denies fever, malaise, fatigue, or abrupt weight changes.  HEENT: Denies eye pain, eye redness, ear pain, ringing in the ears, wax buildup, runny nose, nasal congestion, bloody nose, or sore throat. Respiratory: Patient reports chronic cough and intermittent shortness of breath.  Denies difficulty breathing, or sputum production.   Cardiovascular: Denies chest pain, chest tightness, palpitations or swelling in the hands or feet.  Gastrointestinal: Patient reports constipation.  Denies abdominal pain, bloating, diarrhea or blood in the stool.  GU: Denies urgency, frequency, pain with urination, burning sensation, blood in urine, odor or discharge. Musculoskeletal: Patient reports chronic joint and muscle pain.  Denies decrease in range of motion, difficulty with gait, or joint swelling.  Skin: Denies redness, rashes, lesions or ulcercations.  Neurological: Patient reports insomnia.  Denies dizziness, difficulty with memory, difficulty with speech or problems with balance and coordination.  Psych: Patient has a history of anxiety and depression.  Denies SI/HI.  No other specific complaints in a complete review of systems (except as listed in HPI above).  Objective:   Physical Exam  BP 104/68 (BP Location: Left Arm, Patient Position: Sitting, Cuff Size:  Normal)   Ht 5'  8 (1.727 m)   Wt 186 lb 6.4 oz (84.6 kg)   BMI 28.34 kg/m     Wt Readings from Last 3 Encounters:  01/10/24 178 lb (80.7 kg)  11/22/23 184 lb (83.5 kg)  11/17/23 182 lb (82.6 kg)    General: Appears her stated age, overweight in NAD. Skin: Warm, dry and intact.  Senile purpura noted of BUE. HEENT: Head: normal shape and size; Eyes: sclera white, no icterus, conjunctiva pink, PERRLA and EOMs intact;  Neck:  Neck supple, trachea midline. No masses, lumps or thyromegaly present.  Cardiovascular: Normal rate and rhythm. S1,S2 noted.  No murmur, rubs or gallops noted. No JVD or BLE edema. No carotid bruits noted. Pulmonary/Chest: Normal effort and positive vesicular breath sounds. No respiratory distress. No wheezes, rales or ronchi noted.  Abdomen: Soft and mildly tender in the epigastric region. Normal bowel sounds.  Musculoskeletal: Pain with palpation over the lumbar spine.  Joint enlargement noted of the knees without joint swelling.  No difficulty with gait.  Neurological: Alert and oriented. Cranial nerves II-XII grossly intact. Coordination normal.  Psychiatric: Mood and affect normal. Behavior is normal. Judgment and thought content normal.     BMET    Component Value Date/Time   NA 140 05/09/2023 1459   NA 142 09/01/2022 1017   K 4.0 05/09/2023 1459   CL 102 05/09/2023 1459   CO2 28 05/09/2023 1459   GLUCOSE 69 05/09/2023 1459   BUN 20 05/09/2023 1459   BUN 22 09/01/2022 1017   CREATININE 1.17 (H) 05/09/2023 1459   CALCIUM  10.3 05/09/2023 1459   GFRNONAA >60 04/07/2020 1022   GFRNONAA 52 (L) 01/02/2020 1453   GFRAA 61 01/02/2020 1453    Lipid Panel     Component Value Date/Time   CHOL 189 05/09/2023 1459   TRIG 113 05/09/2023 1459   HDL 57 05/09/2023 1459   CHOLHDL 3.3 05/09/2023 1459   VLDL 42.0 (H) 05/14/2014 0918   LDLCALC 110 (H) 05/09/2023 1459    CBC    Component Value Date/Time   WBC 7.1 05/09/2023 1459   RBC 3.64 (L) 05/09/2023 1459   HGB 10.8  (L) 05/09/2023 1459   HGB 10.1 (L) 09/01/2022 1017   HCT 34.8 (L) 05/09/2023 1459   HCT 31.4 (L) 09/01/2022 1017   PLT 194 05/09/2023 1459   MCV 95.6 05/09/2023 1459   MCV 94 09/01/2022 1017   MCH 29.7 05/09/2023 1459   MCHC 31.0 (L) 05/09/2023 1459   RDW 13.7 05/09/2023 1459   RDW 13.3 09/01/2022 1017   LYMPHSABS 2,125 10/28/2022 1137   LYMPHSABS 1.7 09/01/2022 1017   MONOABS 0.4 09/16/2013 1046   EOSABS 59 10/28/2022 1137   EOSABS 0.1 09/01/2022 1017   BASOSABS 40 10/28/2022 1137   BASOSABS 0.1 09/01/2022 1017    Hgb A1C Lab Results  Component Value Date   HGBA1C 5.1 05/09/2023           Assessment & Plan:     RTC in 6 months for your annual exam. Angeline Laura, NP "

## 2024-04-19 NOTE — Assessment & Plan Note (Signed)
 C-Met and lipid profile today Encouraged her to consume low-fat diet Continue atorvastatin  10 mg daily

## 2024-04-19 NOTE — Assessment & Plan Note (Signed)
 Encouraged stress reduction techniques Continue rizatriptan  10 mg daily as needed Will monitor

## 2024-04-19 NOTE — Assessment & Plan Note (Signed)
In remission We will monitor 

## 2024-04-19 NOTE — Telephone Encounter (Signed)
 Copied from CRM [id number]. Topic: Clinical - Prescription Issue >> Apr 19, 2024 11:45 AM Pamela Romero wrote: Reason for CRM: needs a quantity of 60 instead of 1 for Fluticasone-Umeclidin-Vilant (TRELEGY ELLIPTA ) 100-62.5-25 MCG/ACT AEPB [[id number]] as reported by pharmacy

## 2024-04-19 NOTE — Assessment & Plan Note (Signed)
 CBC today She may need to consider starting oral iron 325 mg daily pending labs

## 2024-04-19 NOTE — Assessment & Plan Note (Signed)
 TSH and free T4 today Continue levothyroxine  125 mcg daily, will adjust if needed based on labs

## 2024-04-19 NOTE — Telephone Encounter (Signed)
 I believe you have been working with this patient. Wanted to start her on trelegy for her COPD but too expensive. I think she is also have a hard time affording linzess . Could you help us  with this?

## 2024-04-19 NOTE — Assessment & Plan Note (Signed)
 Encouraged vitamin D and calcium OTC Encouraged daily weightbearing exercise

## 2024-04-19 NOTE — Assessment & Plan Note (Signed)
 Try to avoid foods that trigger reflux Encourage weight loss as this can help reduce reflux symptoms Continue as omeprazole  40 mg twice daily Will discontinue famotidine  20 mg nightly due to nonuse She will continue to follow with GI

## 2024-04-19 NOTE — Assessment & Plan Note (Signed)
 Encourage weight loss as this can help reduce joint pain Continue hydrocodone  7.5-325 every 6 hours as needed and gabapentin  300 to 600 mg nightly  She will continue to follow with pain management

## 2024-04-20 LAB — CBC
HCT: 28.5 % — ABNORMAL LOW (ref 35.9–46.0)
Hemoglobin: 8.4 g/dL — ABNORMAL LOW (ref 11.7–15.5)
MCH: 25.5 pg — ABNORMAL LOW (ref 27.0–33.0)
MCHC: 29.5 g/dL — ABNORMAL LOW (ref 31.6–35.4)
MCV: 86.6 fL (ref 81.4–101.7)
MPV: 10.3 fL (ref 7.5–12.5)
Platelets: 190 Thousand/uL (ref 140–400)
RBC: 3.29 Million/uL — ABNORMAL LOW (ref 3.80–5.10)
RDW: 15.7 % — ABNORMAL HIGH (ref 11.0–15.0)
WBC: 5.5 Thousand/uL (ref 3.8–10.8)

## 2024-04-20 LAB — COMPREHENSIVE METABOLIC PANEL WITH GFR
AG Ratio: 1.7 (calc) (ref 1.0–2.5)
ALT: 6 U/L (ref 6–29)
AST: 13 U/L (ref 10–35)
Albumin: 3.7 g/dL (ref 3.6–5.1)
Alkaline phosphatase (APISO): 69 U/L (ref 37–153)
BUN/Creatinine Ratio: 20 (calc) (ref 6–22)
BUN: 23 mg/dL (ref 7–25)
CO2: 30 mmol/L (ref 20–32)
Calcium: 9.3 mg/dL (ref 8.6–10.4)
Chloride: 106 mmol/L (ref 98–110)
Creat: 1.13 mg/dL — ABNORMAL HIGH (ref 0.60–1.00)
Globulin: 2.2 g/dL (ref 1.9–3.7)
Glucose, Bld: 73 mg/dL (ref 65–99)
Potassium: 4 mmol/L (ref 3.5–5.3)
Sodium: 142 mmol/L (ref 135–146)
Total Bilirubin: 0.2 mg/dL (ref 0.2–1.2)
Total Protein: 5.9 g/dL — ABNORMAL LOW (ref 6.1–8.1)
eGFR: 52 mL/min/1.73m2 — ABNORMAL LOW

## 2024-04-20 LAB — TSH: TSH: 78.16 m[IU]/L — ABNORMAL HIGH (ref 0.40–4.50)

## 2024-04-20 LAB — T4, FREE: Free T4: 0.4 ng/dL — ABNORMAL LOW (ref 0.8–1.8)

## 2024-04-20 LAB — LIPID PANEL
Cholesterol: 210 mg/dL — ABNORMAL HIGH
HDL: 51 mg/dL
LDL Cholesterol (Calc): 131 mg/dL — ABNORMAL HIGH
Non-HDL Cholesterol (Calc): 159 mg/dL — ABNORMAL HIGH
Total CHOL/HDL Ratio: 4.1 (calc)
Triglycerides: 166 mg/dL — ABNORMAL HIGH

## 2024-04-20 LAB — HEMOGLOBIN A1C
Hgb A1c MFr Bld: 5.2 %
Mean Plasma Glucose: 103 mg/dL
eAG (mmol/L): 5.7 mmol/L

## 2024-04-22 ENCOUNTER — Ambulatory Visit: Attending: Nurse Practitioner | Admitting: Student in an Organized Health Care Education/Training Program

## 2024-04-22 ENCOUNTER — Other Ambulatory Visit: Admitting: Pharmacist

## 2024-04-22 ENCOUNTER — Encounter: Payer: Self-pay | Admitting: Internal Medicine

## 2024-04-22 ENCOUNTER — Encounter: Payer: Self-pay | Admitting: Student in an Organized Health Care Education/Training Program

## 2024-04-22 ENCOUNTER — Ambulatory Visit: Payer: Self-pay | Admitting: Internal Medicine

## 2024-04-22 DIAGNOSIS — M1712 Unilateral primary osteoarthritis, left knee: Secondary | ICD-10-CM | POA: Insufficient documentation

## 2024-04-22 DIAGNOSIS — N1831 Chronic kidney disease, stage 3a: Secondary | ICD-10-CM

## 2024-04-22 DIAGNOSIS — J410 Simple chronic bronchitis: Secondary | ICD-10-CM

## 2024-04-22 DIAGNOSIS — E890 Postprocedural hypothyroidism: Secondary | ICD-10-CM

## 2024-04-22 MED ORDER — ATORVASTATIN CALCIUM 20 MG PO TABS: 20.0000 mg | ORAL_TABLET | Freq: Every day | ORAL | 1 refills | Status: AC

## 2024-04-22 MED ORDER — LIDOCAINE HCL 2 % IJ SOLN
20.0000 mL | Freq: Once | INTRAMUSCULAR | Status: AC
  Administered 2024-04-22: 400 mg

## 2024-04-22 MED ORDER — LEVOTHYROXINE SODIUM 175 MCG PO TABS: 175.0000 ug | ORAL_TABLET | Freq: Every day | ORAL | 0 refills | Status: AC

## 2024-04-22 MED ORDER — BREZTRI AEROSPHERE 160-9-4.8 MCG/ACT IN AERO: 2.0000 | INHALATION_SPRAY | Freq: Two times a day (BID) | RESPIRATORY_TRACT | 11 refills | Status: AC

## 2024-04-22 MED ORDER — TRIAMCINOLONE ACETONIDE 32 MG IX SRER
32.0000 mg | Freq: Once | INTRA_ARTICULAR | Status: AC
  Administered 2024-04-22: 32 mg via INTRA_ARTICULAR
  Filled 2024-04-22: qty 5

## 2024-04-22 NOTE — Progress Notes (Signed)
 PROVIDER NOTE: Interpretation of information contained herein should be left to medically-trained personnel. Specific patient instructions are provided elsewhere under Patient Instructions section of medical record. This document was created in part using STT-dictation technology, any transcriptional errors that may result from this process are unintentional.  Patient: Pamela Romero Type: Established DOB: 08-Sep-1950 MRN: [id number] PCP: Antonette Angeline ORN, NP  Service: Procedure DOS: 04/22/2024 Setting: Ambulatory Location: Ambulatory outpatient facility Delivery: Face-to-face Provider: Wallie Sherry, MD Specialty: Interventional Pain Management Specialty designation: 09 Location: Outpatient facility Ref. Prov.: Patel, Seema K, NP   Procedure [facility] Interventional Pain Management )    Procedure: Steroid Knee Injection (LONG ACTING Kennalog)  Laterality: Left (-LT) Level: Intra-articular  No.: L9 Series: n/a Purpose: Therapeutic Indications: Knee arthralgia associated to osteoarthritis of the knee   Imaging: None required (CPT-20610) Analgesia: Skin infiltration w/ local anesthetics Sedation: None  NAS-11 score:   Pre-procedure: 7 /10   Post-procedure: 0-No pain/10      1. Primary osteoarthritis of left knee     Pre-Procedure Preparation  Monitoring: As per clinic protocol.  Risk Assessment: Vitals:  AFP:Zdupfjuzi body mass index is 27.72 kg/m as calculated from the following:   Height as of this encounter: 5' 8.5 (1.74 m).   Weight as of this encounter: 185 lb (83.9 kg)., Rate:90 , BP:103/74, Resp:16, Temp:(!) 97.3 F (36.3 C), SpO2:99 %  Allergies: She has no known allergies.  Precautions: None required  Blood-thinner(s): None at this time  Coagulopathies: Reviewed. None identified.   Active Infection(s): Reviewed. None identified. Pamela Romero is afebrile   Location setting: Exam room Position: Sitting w/ knee bent 90 degrees Safety Precautions: Patient was  assessed for positional comfort and pressure points before starting the procedure. Prepping solution: DuraPrep (Iodine Povacrylex [0.7% available iodine] and Isopropyl Alcohol, 74% w/w) Prep Area: Entire knee region Approach: percutaneous, just above the tibial plateau, medial to the infrapatellar tendon. Intended target: Intra-articular knee space Materials: Tray: Block Needle(s): Regular Qty: 1/side Length: 1.5-inch Gauge: 25G   Meds ordered this encounter  Medications   lidocaine  (XYLOCAINE ) 2 % (with pres) injection 400 mg   Triamcinolone  Acetonide (ZILRETTA ) intra-articular injection 32 mg    Maintain refrigerated.  Prepared suspension may be stored up to 4 hours at ambient conditions.     No orders of the defined types were placed in this encounter.     Time-out: 1315 I initiated and conducted the Time-out before starting the procedure, as per protocol. The patient was asked to participate by confirming the accuracy of the Time Out information. Verification of the correct person, site, and procedure were performed and confirmed by me, the nursing staff, and the patient. Time-out conducted as per Joint Commission's Universal Protocol (UP.01.01.01). Procedure checklist: Completed   H&P (Pre-op  Assessment)  Pamela Romero is a 74 y.o. (year old), female patient, seen today for interventional treatment. She  has a past surgical history that includes Thyroidectomy (2005); Cardiac catheterization (2006); Total abdominal hysterectomy; Breast surgery (Right, 06/2012); Breast cyst aspiration (Right, 2014); Esophagogastroduodenoscopy (N/A, 04/07/2020); Esophagogastroduodenoscopy (egd) with propofol  (N/A, 11/07/2022); biopsy (11/07/2022); Balloon dilation (11/07/2022); and polypectomy (11/07/2022). Pamela Romero has a current medication list which includes the following prescription(s): albuterol , aspirin  ec, atorvastatin , esomeprazole , gabapentin , hydrocodone -acetaminophen , [START ON  05/12/2024] hydrocodone -acetaminophen , [START ON 06/11/2024] hydrocodone -acetaminophen , levothyroxine , lisinopril -hydrochlorothiazide , naloxone , polyethylene glycol powder, rizatriptan , breztri  aerosphere, and linaclotide . Her primarily concern today is the Knee Pain (Left )   She has no known allergies.   Last encounter: My last encounter with her  was on 11/03/2020. Pertinent problems: Pamela Romero has Bilateral primary osteoarthritis of knee (L>R) and Osteoporosis on their pertinent problem list. Pain Assessment: Severity of Chronic pain is reported as a 7 /10. Location: Knee Left/runs down into calf on the left. Onset: More than a month ago. Quality: Discomfort, Constant, Aching, Sharp. Timing: Constant. Modifying factor(s): medications. Vitals:  height is 5' 8.5 (1.74 m) and weight is 185 lb (83.9 kg). Her temporal temperature is 97.3 F (36.3 C) (abnormal). Her blood pressure is 103/74 and her pulse is 90. Her respiration is 16 and oxygen saturation is 99%.   Reason for encounter: interventional pain management therapy due pain of at least four (4) weeks in duration, with failure to respond and/or inability to tolerate more conservative care.     Related imaging: arrative & Impression  CLINICAL DATA:  Left knee pain/arthralgia. Primary osteoarthritis of left knee.   EXAM: LEFT KNEE - COMPLETE 4+ VIEW   COMPARISON:  MRI 09/01/2017 reviewed   FINDINGS: Medial tibiofemoral joint space narrowing. Mild to moderate tricompartmental peripheral spurring. Subchondral sclerosis in the medial tibiofemoral compartment. No fracture, erosion, or focal bone abnormality. No joint effusion. Peripheral vascular calcifications.   IMPRESSION: Mild to moderate tricompartmental osteoarthritis, most prominent in the medial tibiofemoral compartment.     Knee-L MR wo contrast:  Results for orders placed during the hospital encounter of 09/01/17  MR KNEE LEFT WO CONTRAST  Narrative CLINICAL DATA:   Persistent anterior knee pain for 1.5 months.  EXAM: MRI OF THE LEFT KNEE WITHOUT CONTRAST  TECHNIQUE: Multiplanar, multisequence MR imaging of the knee was performed. No intravenous contrast was administered.  COMPARISON:  None.  FINDINGS: MENISCI  Medial meniscus: There is an extensive horizontal tear posterior horn extending from the superior surface to the periphery. The midbody is degenerated and peripherally subluxed.  Lateral meniscus: Extensive horizontal tear involving the anterior horn and midbody with small parameniscal cysts at the level of the midbody best seen on image 34 of series 7 and series 31 of series 5.  LIGAMENTS  Cruciates:  Intact.  Collaterals:  Intact.  CARTILAGE  Patellofemoral: Partial-thickness cartilage loss of the superior aspect of the medial facet of the patella.  Medial: Denuding of the articular cartilage of the medial tibial plateau with subcortical edema and subcortical cyst formation in the periphery of the tibial plateau. Thinning of the articular cartilage of the femoral condyle.  Lateral: Focal full-thickness cartilage loss in the posterior aspect of the lateral tibial plateau.  Joint: Trace joint effusion. Normal Hoffa's fat pad. No plical thickening.  Popliteal Fossa:  Small Baker's cyst.  Intact popliteus tendon.  Extensor Mechanism:  Normal.  Bones: Moderate marginal osteophytes in the medial compartment. Tiny marginal osteophytes in the lateral compartment.  Other: None  IMPRESSION: 1. Extensive horizontal tear of the posterior horn of the medial meniscus. 2. Extensive horizontal tear of the anterior horn and midbody of the lateral meniscus. 3. Moderate osteoarthritis of the medial compartment with full-thickness cartilage loss and subcortical edema and subcortical cyst formation in the medial tibial plateau.   Electronically Signed By: Lynwood Hugger M.D. On: 09/01/2017 10:59 \    Site Confirmation:  Ms. Kloos was asked to confirm the procedure and laterality before marking the site.  Consent: Before the procedure and under the influence of no sedative(s), amnesic(s), or anxiolytics, the patient was informed of the treatment options, risks and possible complications. To fulfill our ethical and legal obligations, as recommended by the American Medical Association's Code  of Ethics, I have informed the patient of my clinical impression; the nature and purpose of the treatment or procedure; the risks, benefits, and possible complications of the intervention; the alternatives, including doing nothing; the risk(s) and benefit(s) of the alternative treatment(s) or procedure(s); and the risk(s) and benefit(s) of doing nothing. The patient was provided information about the general risks and possible complications associated with the procedure. These may include, but are not limited to: failure to achieve desired goals, infection, bleeding, organ or nerve damage, allergic reactions, paralysis, and death. In addition, the patient was informed of those risks and complications associated to Spine-related procedures, such as failure to decrease pain; infection (i.e.: Meningitis, epidural or intraspinal abscess); bleeding (i.e.: epidural hematoma, subarachnoid hemorrhage, or any other type of intraspinal or peri-dural bleeding); organ or nerve damage (i.e.: Any type of peripheral nerve, nerve root, or spinal cord injury) with subsequent damage to sensory, motor, and/or autonomic systems, resulting in permanent pain, numbness, and/or weakness of one or several areas of the body; allergic reactions; (i.e.: anaphylactic reaction); and/or death. Furthermore, the patient was informed of those risks and complications associated with the medications. These include, but are not limited to: allergic reactions (i.e.: anaphylactic or anaphylactoid reaction(s)); adrenal axis suppression; blood sugar elevation that in diabetics  may result in ketoacidosis or comma; water retention that in patients with history of congestive heart failure may result in shortness of breath, pulmonary edema, and decompensation with resultant heart failure; weight gain; swelling or edema; medication-induced neural toxicity; particulate matter embolism and blood vessel occlusion with resultant organ, and/or nervous system infarction; and/or aseptic necrosis of one or more joints. Finally, the patient was informed that Medicine is not an exact science; therefore, there is also the possibility of unforeseen or unpredictable risks and/or possible complications that may result in a catastrophic outcome. The patient indicated having understood very clearly. We have given the patient no guarantees and we have made no promises. Enough time was given to the patient to ask questions, all of which were answered to the patient's satisfaction. Ms. Croker has indicated that she wanted to continue with the procedure. Attestation: I, the ordering provider, attest that I have discussed with the patient the benefits, risks, side-effects, alternatives, likelihood of achieving goals, and potential problems during recovery for the procedure that I have provided informed consent.  Date  Time: 04/22/2024 12:32 PM   Prophylactic antibiotics  Anti-infectives (From admission, onward)    None      Indication(s): None identified   Description of procedure   Start Time: 1315 hrs  Local Anesthesia: Once the patient was positioned, prepped, and time-out was completed. The target area was identified located. The skin was marked with an approved surgical skin marker. Once marked, the skin (epidermis, dermis, and hypodermis), and deeper tissues (fat, connective tissue and muscle) were infiltrated with a small amount of a short-acting local anesthetic, loaded on a 10cc syringe with a 25G, 1.5-in  Needle. An appropriate amount of time was allowed for local anesthetics to take  effect before proceeding to the next step. Local Anesthetic: Lidocaine  1-2% The unused portion of the local anesthetic was discarded in the proper designated containers. Safety Precautions: Aspiration looking for blood return was conducted prior to all injections. At no point did I inject any substances, as a needle was being advanced. Before injecting, the patient was told to immediately notify me if she was experiencing any new onset of ringing in the ears, or metallic taste in the mouth.  No attempts were made at seeking any paresthesias. Safe injection practices and needle disposal techniques used. Medications properly checked for expiration dates. SDV (single dose vial) medications used. After the completion of the procedure, all disposable equipment used was discarded in the proper designated medical waste containers.  Technical description: Protocol guidelines were followed. After positioning, the target area was identified and prepped in the usual manner. Skin & deeper tissues infiltrated with local anesthetic. Appropriate amount of time allowed to pass for local anesthetics to take effect. Proper needle placement secured. Once satisfactory needle placement was confirmed, I proceeded to inject the desired solution in slow, incremental fashion, intermittently assessing for discomfort or any signs of abnormal or undesired spread of substance. Once completed, the needle was removed and disposed of, as per hospital protocols. The area was cleaned, making sure to leave some of the prepping solution back to take advantage of its long term bactericidal properties.  Aspiration:  Negative   Vitals:   04/22/24 1235  BP: 103/74  Pulse: 90  Resp: 16  Temp: (!) 97.3 F (36.3 C)  TempSrc: Temporal  SpO2: 99%  Weight: 185 lb (83.9 kg)  Height: 5' 8.5 (1.74 m)       End Time: 1318 hrs     Post-op assessment  Post-procedure Vital Signs:  Pulse/HCG Rate: 90  Temp: (!) 97.3 F (36.3  C)  Resp: 16 BP: 103/74 SpO2: 99 %  EBL: None  Complications: No immediate post-treatment complications observed by team, or reported by patient.  Note: The patient tolerated the entire procedure well. A repeat set of vitals were taken after the procedure and the patient was kept under observation following institutional policy, for this type of procedure. Post-procedural neurological assessment was performed, showing return to baseline, prior to discharge. The patient was provided with post-procedure discharge instructions, including a section on how to identify potential problems. Should any problems arise concerning this procedure, the patient was given instructions to immediately contact us , at any time, without hesitation. In any case, we plan to contact the patient by telephone for a follow-up status report regarding this interventional procedure.  Comments:  No additional relevant information.   Plan of care    Requested Prescriptions    No prescriptions requested or ordered in this encounter      Medications administered: We administered lidocaine  and Triamcinolone  Acetonide.    Follow-up plan:   Return for Keep sch. appt.     Recent Visits Date Type Provider Dept  04/09/24 Office Visit Patel, Seema K, NP [facility]  Showing recent visits within past 90 days and meeting all other requirements Today's Visits Date Type Provider Dept  04/22/24 Procedure visit Marcelino Nurse, MD [facility]  Showing today's visits and meeting all other requirements Future Appointments Date Type Provider Dept  07/01/24 Appointment Patel, Seema K, NP [facility]  Showing future appointments within next 90 days and meeting all other requirements   Disposition: Discharge home  Discharge (Date  Time): 04/22/2024; 1322 hrs.   Primary Care Physician: Antonette Angeline ORN, NP Location: [facility] Note by: Nurse Marcelino, MD Date:  04/22/2024; Time: 1:53 PM  DISCLAIMER: Medicine is not an visual merchandiser. It has no guarantees or warranties. The decision to proceed with this intervention was based on the information collected from the patient. Conclusions were drawn from the patient's questionnaire, interview, and examination. Because information was provided in large part by the patient, it cannot be guaranteed that it  has not been purposely or unconsciously manipulated or altered. Every effort has been made to obtain as much accurate, relevant, available data as possible. Always take into account that the treatment will also be dependent on availability of resources and existing treatment guidelines, considered by other Pain Management Specialists as being common knowledge and practice, at the time of the intervention. It is also important to point out that variation in procedural techniques and pharmacological choices are the acceptable norm. For Medico-Legal review purposes, the indications, contraindications, technique, and results of the these procedures should only be evaluated, judged and interpreted by a Board-Certified Interventional Pain Specialist with extensive familiarity and expertise in the same exact procedure and technique.

## 2024-04-22 NOTE — Patient Instructions (Signed)

## 2024-04-22 NOTE — Progress Notes (Unsigned)
 "  04/22/2024 Name: Pamela Romero MRN: [id number] DOB: 03/06/1951  Chief Complaint  Patient presents with   Medication Management   Medication Assistance    Pamela Romero is a 74 y.o. year old female who presented for a telephone visit.   They were referred to the pharmacist by a quality report for assistance in managing medication adherence.      Subjective:   Care Team: Primary Care Provider: Antonette Angeline ORN, NP; Next Scheduled Visit: 07/22/2024 Pain Specialist: Patel, Seema K, NP; Next Scheduled Visit: 07/02/3034  Medication Access/Adherence  Current Pharmacy:  [facility] [place] [place] - [address] [place] [address] Phone: [phone] Fax: [phone]   Patient reports affordability concerns with their medications: Yes  Patient reports access/transportation concerns to their pharmacy: No  Patient reports adherence concerns with their medications:  No    From review of chart, note PCP office contacted patient today regarding lab Results Follow-Up. Patient was advised: - Referral placed for hematology urgently for discussion of blood vs iron infusion - Referral placed to nephrology for further evaluation of kidney function - New prescription sent for levothyroxine  175 mcg daily  - New prescription sent for atorvastatin  20 mg daily   Also, receive message from PCP today requesting support to patient with medication cost assistance as cost of Trelegy inhaler is unaffordable for patient    Note currently collaborating with PCP and CPhT to assist patient with applying for patient assistance for Linzess  from [employer]             From review of chart, note CPhT last re-faxed application to [employer] on 03/27/2024    COPD:  Current medications: - albuterol  HFA inhaler - 1-2 puffs every 6 hours as needed for shortness of breath   Reports was prescribed Trelegy maintenance inhaler by PCP at Office Visit on 1/9, but was unable to start due to  cost    Hypertension:   Current medications: lisinopril /HCTZ 10-12.5 mg daily     Patient has an automated, upper arm home BP cuff Current blood pressure readings readings: Yesterday: 116/72   Patient denies hypotensive s/sx including dizziness, lightheadedness as long as takes positional changes slowly   Current physical activity: reports limited by leg pain   Objective:  Lab Results  Component Value Date   CREATININE 1.13 (H) 04/19/2024   BUN 23 04/19/2024   NA 142 04/19/2024   K 4.0 04/19/2024   CL 106 04/19/2024   CO2 30 04/19/2024    Lab Results  Component Value Date   CHOL 210 (H) 04/19/2024   HDL 51 04/19/2024   LDLCALC 131 (H) 04/19/2024   LDLDIRECT 144.0 01/21/2015   TRIG 166 (H) 04/19/2024   CHOLHDL 4.1 04/19/2024   BP Readings from Last 3 Encounters:  04/22/24 103/74  04/19/24 104/68  04/09/24 (!) 120/57   Pulse Readings from Last 3 Encounters:  04/22/24 90  04/09/24 91  01/10/24 93    Medications Ordered Prior to Encounter[1]    Assessment/Plan:   Collaborating with PCP and CPhT to assist patient with applying for patient assistance for Linzess  from [employer]   Encourage patient to continue using weekly pillbox consistently to aid with medication adherence and to also use latest copy of medication list when refilling pillbox.  Advise patient to pick up and start taking new atorvastatin  and levothyroxine  prescriptions (in place of previous strengths) today   Hypertension: - Reviewed long term cardiovascular and renal outcomes of uncontrolled  blood pressure - Recommend to obtain a new upper arm blood pressure monitor using health plan over the counter benefit, restart monitoring home blood pressure, keep log of results and have this record to review during upcoming medical appointments   COPD: - Patient does not currently meet criteria to apply for Trelegy patient assistance for [employer] as has not met the $600 out of pocket spend requirement for  2026 - However, patient would meet financial criteria for Breztri  patient assistance program through [employer]. Collaborate with PCP to recommend change from Trelegy to Breztri  for affordability/assistance program eligibility. Provider agrees to plan to switch to Breztri . Will collaborate with provider, CPhT, and patient to pursue assistance.   PCP confirms sample of Breztri  available and will have sample available for pickup   Follow up with patient to provide this update     Follow Up Plan: Clinical Pharmacist will follow up with patient by telephone on 05/20/2024 at 11:30 AM    Sharyle Sia, PharmD, BCACP Clinical Pharmacist [facility] [phone]      [1]  Current Outpatient Medications on File Prior to Visit  Medication Sig Dispense Refill   esomeprazole  (NEXIUM ) 40 MG capsule Take 1 capsule (40 mg total) by mouth 2 (two) times daily before a meal. 180 capsule 0   lisinopril -hydrochlorothiazide  (ZESTORETIC ) 10-12.5 MG tablet Take 1 tablet by mouth daily. 90 tablet 0   albuterol  (PROAIR  HFA) 108 (90 Base) MCG/ACT inhaler Inhale 1-2 puffs into the lungs every 6 (six) hours as needed for wheezing or shortness of breath. 6.7 g 1   aspirin  EC 81 MG tablet Take 1 tablet (81 mg total) by mouth daily. Swallow whole. 30 tablet 12   budesonide-glycopyrrolate -formoterol (BREZTRI  AEROSPHERE) 160-9-4.8 MCG/ACT AERO inhaler Inhale 2 puffs into the lungs 2 (two) times daily. 10.7 g 11   gabapentin  (NEURONTIN ) 300 MG capsule Take 1-2 capsules (300-600 mg total) by mouth at bedtime. 180 capsule 2   HYDROcodone -acetaminophen  (NORCO) 7.5-325 MG tablet Take 1 tablet by mouth every 6 (six) hours as needed for moderate pain (pain score 4-6). 120 tablet 0   [START ON 05/12/2024] HYDROcodone -acetaminophen  (NORCO) 7.5-325 MG tablet Take 1 tablet by mouth every 6 (six) hours as needed for moderate pain (pain score 4-6). 120 tablet 0   [START ON 06/11/2024] HYDROcodone -acetaminophen   (NORCO) 7.5-325 MG tablet Take 1 tablet by mouth every 6 (six) hours as needed for moderate pain (pain score 4-6). 120 tablet 0   linaclotide  (LINZESS ) 145 MCG CAPS capsule Take 1 capsule (145 mcg total) by mouth daily before breakfast. As needed. (Patient not taking: Reported on 04/19/2024) 90 capsule 1   naloxone  (NARCAN ) nasal spray 4 mg/0.1 mL Place 1 spray into the nose as needed for up to 365 doses (for opioid-induced respiratory depresssion). In case of emergency (overdose), spray once into each nostril. If no response within 3 minutes, repeat application and call 911. 1 each 1   polyethylene glycol powder (GLYCOLAX /MIRALAX ) 17 GM/SCOOP powder Take 17 g by mouth daily. 3350 g 1   rizatriptan  (MAXALT -MLT) 10 MG disintegrating tablet Take 1 tablet (10 mg total) by mouth as needed for migraine. May repeat in 2 hours if needed 10 tablet 2   No current facility-administered medications on file prior to visit.   "

## 2024-04-22 NOTE — Addendum Note (Signed)
 Addended by: ANTONETTE ANGELINE ORN on: 04/22/2024 12:45 PM   Modules accepted: Orders

## 2024-04-22 NOTE — Telephone Encounter (Signed)
 Referrals placed, medication sent to pharmacy.  I would like to see her back in 3 months instead of 6 (40-minute appointment).

## 2024-04-22 NOTE — Progress Notes (Signed)
 Safety precautions to be maintained throughout the outpatient stay will include: orient to surroundings, keep bed in low position, maintain call bell within reach at all times, provide assistance with transfer out of bed and ambulation.

## 2024-04-23 ENCOUNTER — Telehealth: Payer: Self-pay

## 2024-04-23 NOTE — Telephone Encounter (Signed)
 Post procedure follow up.  Patient states she is doing well.   ?

## 2024-04-23 NOTE — Telephone Encounter (Signed)
 PAP: Patient assistance application for Breztri  through [employer] (AZ&Me) has been mailed to pt's home address on file. Provider portion of application will be faxed to provider's office.

## 2024-04-24 NOTE — Patient Instructions (Signed)
"   Goals Addressed             This Visit's Progress    Pharmacy Goals       Please watch the mail for an envelope from [facility] containing the patient assistance program application. Please complete this application and bring to office to have it faxed back to Attention: Suzen Mall at Fax # [phone] along with a copy of your Medicare Part D prescription card and a copy of your proof of income document.  If you need to call Suzen, you can reach her at [phone].  Please use a weekly pillbox to organize your medications.  Check your blood pressure twice weekly, and any time you have concerning symptoms like headache, chest pain, dizziness, shortness of breath, or vision changes.   Our goal is less than 130/80.  To appropriately check your blood pressure, make sure you do the following:  1) Avoid caffeine, exercise, or tobacco products for 30 minutes before checking. Empty your bladder. 2) Sit with your back supported in a flat-backed chair. Rest your arm on something flat (arm of the chair, table, etc). 3) Sit still with your feet flat on the floor, resting, for at least 5 minutes.  4) Check your blood pressure. Take 1-2 readings.  5) Write down these readings and bring with you to any provider appointments.  Bring your home blood pressure machine with you to a provider's office for accuracy comparison at least once a year.   Make sure you take your blood pressure medications before you come to any office visit, even if you were asked to fast for labs.   Sharyle Sia, PharmD, [facility] Clinical Pharmacist [facility] [phone]         "

## 2024-04-25 NOTE — Telephone Encounter (Signed)
 Provider portion PAP application has been received (AZ[employer]ME) Breztri .

## 2024-04-26 ENCOUNTER — Encounter: Payer: Self-pay | Admitting: Oncology

## 2024-04-26 ENCOUNTER — Ambulatory Visit: Payer: Self-pay | Admitting: Oncology

## 2024-04-26 ENCOUNTER — Inpatient Hospital Stay

## 2024-04-26 ENCOUNTER — Inpatient Hospital Stay: Attending: Oncology | Admitting: Oncology

## 2024-04-26 VITALS — BP 112/75 | HR 86 | Resp 18 | Ht 68.5 in | Wt 190.0 lb

## 2024-04-26 DIAGNOSIS — R109 Unspecified abdominal pain: Secondary | ICD-10-CM | POA: Diagnosis not present

## 2024-04-26 DIAGNOSIS — I129 Hypertensive chronic kidney disease with stage 1 through stage 4 chronic kidney disease, or unspecified chronic kidney disease: Secondary | ICD-10-CM | POA: Insufficient documentation

## 2024-04-26 DIAGNOSIS — D649 Anemia, unspecified: Secondary | ICD-10-CM

## 2024-04-26 DIAGNOSIS — N183 Chronic kidney disease, stage 3 unspecified: Secondary | ICD-10-CM | POA: Insufficient documentation

## 2024-04-26 DIAGNOSIS — Z8542 Personal history of malignant neoplasm of other parts of uterus: Secondary | ICD-10-CM | POA: Diagnosis not present

## 2024-04-26 DIAGNOSIS — E538 Deficiency of other specified B group vitamins: Secondary | ICD-10-CM | POA: Insufficient documentation

## 2024-04-26 DIAGNOSIS — Z800 Family history of malignant neoplasm of digestive organs: Secondary | ICD-10-CM | POA: Insufficient documentation

## 2024-04-26 DIAGNOSIS — K5909 Other constipation: Secondary | ICD-10-CM | POA: Insufficient documentation

## 2024-04-26 DIAGNOSIS — Z79890 Hormone replacement therapy (postmenopausal): Secondary | ICD-10-CM | POA: Insufficient documentation

## 2024-04-26 DIAGNOSIS — Z809 Family history of malignant neoplasm, unspecified: Secondary | ICD-10-CM

## 2024-04-26 DIAGNOSIS — D631 Anemia in chronic kidney disease: Secondary | ICD-10-CM | POA: Insufficient documentation

## 2024-04-26 DIAGNOSIS — K219 Gastro-esophageal reflux disease without esophagitis: Secondary | ICD-10-CM | POA: Diagnosis not present

## 2024-04-26 DIAGNOSIS — N1831 Chronic kidney disease, stage 3a: Secondary | ICD-10-CM

## 2024-04-26 DIAGNOSIS — E039 Hypothyroidism, unspecified: Secondary | ICD-10-CM | POA: Insufficient documentation

## 2024-04-26 DIAGNOSIS — Z87891 Personal history of nicotine dependence: Secondary | ICD-10-CM | POA: Diagnosis not present

## 2024-04-26 DIAGNOSIS — G8929 Other chronic pain: Secondary | ICD-10-CM | POA: Insufficient documentation

## 2024-04-26 DIAGNOSIS — Z801 Family history of malignant neoplasm of trachea, bronchus and lung: Secondary | ICD-10-CM | POA: Insufficient documentation

## 2024-04-26 DIAGNOSIS — M1711 Unilateral primary osteoarthritis, right knee: Secondary | ICD-10-CM | POA: Insufficient documentation

## 2024-04-26 DIAGNOSIS — E890 Postprocedural hypothyroidism: Secondary | ICD-10-CM

## 2024-04-26 LAB — CBC WITH DIFFERENTIAL/PLATELET
Abs Immature Granulocytes: 0.07 K/uL (ref 0.00–0.07)
Basophils Absolute: 0.1 K/uL (ref 0.0–0.1)
Basophils Relative: 1 %
Eosinophils Absolute: 0.1 K/uL (ref 0.0–0.5)
Eosinophils Relative: 1 %
HCT: 28.9 % — ABNORMAL LOW (ref 36.0–46.0)
Hemoglobin: 8.4 g/dL — ABNORMAL LOW (ref 12.0–15.0)
Immature Granulocytes: 1 %
Lymphocytes Relative: 26 %
Lymphs Abs: 1.7 K/uL (ref 0.7–4.0)
MCH: 26 pg (ref 26.0–34.0)
MCHC: 29.1 g/dL — ABNORMAL LOW (ref 30.0–36.0)
MCV: 89.5 fL (ref 80.0–100.0)
Monocytes Absolute: 0.5 K/uL (ref 0.1–1.0)
Monocytes Relative: 7 %
Neutro Abs: 4.1 K/uL (ref 1.7–7.7)
Neutrophils Relative %: 64 %
Platelets: 176 K/uL (ref 150–400)
RBC: 3.23 MIL/uL — ABNORMAL LOW (ref 3.87–5.11)
RDW: 16.5 % — ABNORMAL HIGH (ref 11.5–15.5)
WBC: 6.5 K/uL (ref 4.0–10.5)
nRBC: 0 % (ref 0.0–0.2)

## 2024-04-26 LAB — RETIC PANEL
Immature Retic Fract: 24.5 % — ABNORMAL HIGH (ref 2.3–15.9)
RBC.: 3.22 MIL/uL — ABNORMAL LOW (ref 3.87–5.11)
Retic Count, Absolute: 68.3 K/uL (ref 19.0–186.0)
Retic Ct Pct: 2.1 % (ref 0.4–3.1)
Reticulocyte Hemoglobin: 27.2 pg — ABNORMAL LOW

## 2024-04-26 LAB — LACTATE DEHYDROGENASE: LDH: 207 U/L (ref 105–235)

## 2024-04-26 LAB — VITAMIN B12: Vitamin B-12: 214 pg/mL (ref 180–914)

## 2024-04-26 LAB — FOLATE: Folate: 3.2 ng/mL — ABNORMAL LOW

## 2024-04-26 MED ORDER — FOLIC ACID 1 MG PO TABS: 1.0000 mg | ORAL_TABLET | Freq: Every day | ORAL | 0 refills | Status: AC

## 2024-04-26 MED ORDER — VITAMIN B-12 1000 MCG PO TABS: 1000.0000 ug | ORAL_TABLET | Freq: Every day | ORAL | 1 refills | Status: AC

## 2024-04-26 NOTE — Assessment & Plan Note (Signed)
 Status post hysterectomy in 2011.

## 2024-04-26 NOTE — Assessment & Plan Note (Addendum)
 Anemia is likely secondary to chronic kidney disease.  Rule out other etiologies. Check CBC, smear, iron TIBC ferritin, B12, folate, multiple myeloma panel, light chain ratio, LDH, haptoglobin.  Lab Results  Component Value Date   HGB 8.4 (L) 04/26/2024   TIBC 316 09/01/2022   IRONPCTSAT 16 09/01/2022   FERRITIN 20 09/01/2022    Ferritin is less than 200.  Recommend IV Venofer weekly x 4 to improve iron stores.  Rationale and side effects were reviewed and discussed with patient.  Patient agrees with the plan.   If hemoglobin persistently remains low despite improvement of iron panel, consider erythropoietin replacement therapy.

## 2024-04-26 NOTE — Addendum Note (Signed)
 Addended by: BABARA CALL on: 04/26/2024 07:59 PM   Modules accepted: Orders

## 2024-04-26 NOTE — Progress Notes (Signed)
 Losing weight without trying Getting full fast Unable to afford some medications Constipated due to pain meds some nausea due to constipation has been told she has urinary issues seeing urologist next week  Has bad left knee- takes pain meds

## 2024-04-26 NOTE — Assessment & Plan Note (Signed)
 Recommend folic acid 1mg  daily

## 2024-04-26 NOTE — Assessment & Plan Note (Signed)
 Patient is on levothyroxine  replacement therapy.

## 2024-04-26 NOTE — Progress Notes (Signed)
 " Hematology/Oncology Consult note [phone] [phone]        REFERRING PROVIDER: Antonette Angeline ORN, NP   CHIEF COMPLAINTS/REASON FOR VISIT:  Evaluation of anemia    ASSESSMENT & PLAN:   Anemia in chronic kidney disease (CKD) Anemia is likely secondary to chronic kidney disease.  Rule out other etiologies. Check CBC, smear, iron TIBC ferritin, B12, folate, multiple myeloma panel, light chain ratio, LDH, haptoglobin.  Lab Results  Component Value Date   HGB 8.4 (L) 04/26/2024   TIBC 316 09/01/2022   IRONPCTSAT 16 09/01/2022   FERRITIN 20 09/01/2022    Ferritin is less than 200.  Recommend IV Venofer weekly x 4 to improve iron stores.  Rationale and side effects were reviewed and discussed with patient.  Patient agrees with the plan.   If hemoglobin persistently remains low despite improvement of iron panel, consider erythropoietin replacement therapy.  Chronic pain of left knee Patient takes pain medication for chronic knee pain due to arthritis.  CKD (chronic kidney disease) stage 3, GFR 30-59 ml/min (HCC) Encourage oral hydration and avoid nephrotoxins.  Check multiple myeloma panel, light chain ratio.  Folate deficiency Recommend folic acid  1 mg daily.  Hypothyroidism Patient is on levothyroxine  replacement therapy.  History of endometrial cancer Status post hysterectomy in 2011.  B12 deficiency B12 level is less than 400.  Recommend patient to start B12 1000 mcg daily.   Orders Placed This Encounter  Procedures   CBC with Differential/Platelet    Standing Status:   Future    Number of Occurrences:   1    Expected Date:   04/26/2024    Expiration Date:   07/25/2024   Multiple Myeloma Panel (SPEP&IFE w/QIG)    Standing Status:   Future    Number of Occurrences:   1    Expected Date:   04/26/2024    Expiration Date:   07/25/2024   Kappa/lambda light chains    Standing Status:   Future    Number of Occurrences:   1    Expected Date:    04/26/2024    Expiration Date:   07/25/2024   Lactate dehydrogenase    Standing Status:   Future    Number of Occurrences:   1    Expected Date:   04/26/2024    Expiration Date:   07/25/2024   Haptoglobin    Standing Status:   Future    Number of Occurrences:   1    Expected Date:   04/26/2024    Expiration Date:   07/25/2024   Folate    Standing Status:   Future    Number of Occurrences:   1    Expected Date:   04/26/2024    Expiration Date:   07/25/2024   Vitamin B12    Standing Status:   Future    Number of Occurrences:   1    Expected Date:   04/26/2024    Expiration Date:   07/25/2024   Retic Panel    Standing Status:   Future    Number of Occurrences:   1    Expected Date:   04/26/2024    Expiration Date:   07/25/2024   Ambulatory referral to Genetics    Referral Priority:   Routine    Referral Type:   Consultation    Referral Reason:   Specialty Services Required    Number of Visits Requested:   1   Follow-up in a few weeks to review  results and management plan. All questions were answered. The patient knows to call the clinic with any problems, questions or concerns.  Zelphia Cap, MD, PhD [facility] Hematology Oncology 04/26/2024   HISTORY OF PRESENTING ILLNESS:   Pamela Romero is a  74 y.o.  female with PMH listed below was seen in consultation at the request of  Antonette Angeline ORN, NP  for evaluation of anemia.   Discussed the use of AI scribe software for clinical note transcription with the patient, who gave verbal consent to proceed.   Patient has anemia with hemoglobin declining from 10.8 g/dL in January 2025 to 8.4 g/dL in January 7973. Hemoglobin was previously within normal limits in 2021 and most of 2022, with gradual decline over the past two years. She endorses significant fatigue and tiredness. She denies melena, chest pain, chest heaviness, lightheadedness, and dyspnea.  She has stage 3 chronic kidney disease. She has no diabetes and her hypertension has been  difficult to control, with recent episodes of hypotension.  She has chronic constipation with daily abdominal pain, which she attributes to her constipation. She is on a bowel regimen but finds it ineffective. She also has gastroesophageal reflux disease managed with twice daily medication, and underwent colonoscopy and upper endoscopy in 2024.  She has chronic right knee pain and swelling due to osteoarthritis, attends a pain clinic monthly, and is on pain medication. She has not pursued knee replacement surgery due to concerns about persistent postoperative pain, as experienced by family members.  She expresses significant concern regarding her strong family history of cancer, with multiple brothers deceased from pancreatic, bone, and stomach cancers. She is interested in genetic counseling and further evaluation for hereditary cancer syndromes   MEDICAL HISTORY:  Past Medical History:  Diagnosis Date   Abnormal EKG    CAD (coronary artery disease)    non critical, by cardiac cath 2006   Cancer [facility])    endometrial Ca   COPD (chronic obstructive pulmonary disease) (HCC)    Endometrial adenocarcinoma (HCC) 2011   Hyperlipidemia    Hypertension    Osteoarthritis    Right foot, Left knee (Dr. Edie)   Reflux gastritis    Thyroid  disease    tobacco abuse    Vaginal delivery    x 2    SURGICAL HISTORY: Past Surgical History:  Procedure Laterality Date   BALLOON DILATION  11/07/2022   Procedure: BALLOON DILATION;  Surgeon: Therisa Bi, MD;  Location: [facility] ENDOSCOPY;  Service: Gastroenterology;;   BIOPSY  11/07/2022   Procedure: BIOPSY;  Surgeon: Therisa Bi, MD;  Location: [facility] ENDOSCOPY;  Service: Gastroenterology;;   BREAST CYST ASPIRATION Right 2014   neg   BREAST SURGERY Right 06/2012   benign Byrnett   CARDIAC CATHETERIZATION  2006   40% LAD, EF 60%   ESOPHAGOGASTRODUODENOSCOPY N/A 04/07/2020   Procedure: ESOPHAGOGASTRODUODENOSCOPY (EGD);  Surgeon: Therisa Bi, MD;   Location: [facility] ENDOSCOPY;  Service: Gastroenterology;  Laterality: N/A;   ESOPHAGOGASTRODUODENOSCOPY (EGD) WITH PROPOFOL  N/A 11/07/2022   Procedure: ESOPHAGOGASTRODUODENOSCOPY (EGD) WITH PROPOFOL ;  Surgeon: Therisa Bi, MD;  Location: [facility] ENDOSCOPY;  Service: Gastroenterology;  Laterality: N/A;   POLYPECTOMY  11/07/2022   Procedure: POLYPECTOMY;  Surgeon: Therisa Bi, MD;  Location: [facility] ENDOSCOPY;  Service: Gastroenterology;;   THYROIDECTOMY  2005   partial at [facility], non malignant tumor   TOTAL ABDOMINAL HYSTERECTOMY     with BSO    SOCIAL HISTORY: Social History   Socioeconomic History   Marital status: Married  Spouse name: Not on file   Number of children: 0   Years of education: Not on file   Highest education level: Not on file  Occupational History   Occupation: retired   Tobacco Use   Smoking status: Former    Current packs/day: 0.00    Average packs/day: 0.5 packs/day for 50.0 years (25.0 ttl pk-yrs)    Types: Cigarettes    Start date: 05/11/1968    Quit date: 05/11/2018    Years since quitting: 5.9   Smokeless tobacco: Former  Building Services Engineer status: Never Used  Substance and Sexual Activity   Alcohol use: No   Drug use: No   Sexual activity: Not on file  Other Topics Concern   Not on file  Social History Narrative   Lives with spouse, takes care of her mother.   Had 2 children, one died at birth, the other given up for adoption (was unmarried).   Social Drivers of Health   Tobacco Use: Medium Risk (04/26/2024)   Patient History    Smoking Tobacco Use: Former    Smokeless Tobacco Use: Former    Passive Exposure: Not on Actuary Strain: Low Risk (04/29/2022)   Overall Financial Resource Strain (CARDIA)    Difficulty of Paying Living Expenses: Not very hard  Food Insecurity: No Food Insecurity (04/29/2022)   Hunger Vital Sign    Worried About Running Out of Food in the Last Year: Never true    Ran Out of Food in the Last Year: Never true   Transportation Needs: No Transportation Needs (04/29/2022)   PRAPARE - Administrator, Civil Service (Medical): No    Lack of Transportation (Non-Medical): No  Physical Activity: Insufficiently Active (04/29/2022)   Exercise Vital Sign    Days of Exercise per Week: 3 days    Minutes of Exercise per Session: 30 min  Stress: No Stress Concern Present (04/29/2022)   [employer] of Occupational Health - Occupational Stress Questionnaire    Feeling of Stress : Only a little  Social Connections: Moderately Integrated (04/29/2022)   Social Connection and Isolation Panel    Frequency of Communication with Friends and Family: Three times a week    Frequency of Social Gatherings with Friends and Family: Twice a week    Attends Religious Services: More than 4 times per year    Active Member of [employer] or Organizations: No    Attends Banker Meetings: Never    Marital Status: Married  Catering Manager Violence: Not At Risk (04/29/2022)   Humiliation, Afraid, Rape, and Kick questionnaire    Fear of Current or Ex-Partner: No    Emotionally Abused: No    Physically Abused: No    Sexually Abused: No  Depression (PHQ2-9): Low Risk (04/22/2024)   Depression (PHQ2-9)    PHQ-2 Score: 2  Alcohol Screen: Low Risk (04/29/2022)   Alcohol Screen    Last Alcohol Screening Score (AUDIT): 0  Housing: Low Risk (04/29/2022)   Housing    Last Housing Risk Score: 0  Utilities: Not At Risk (04/29/2022)   AHC Utilities    Threatened with loss of utilities: No  Health Literacy: Not on file    FAMILY HISTORY: Family History  Problem Relation Age of Onset   Alcohol abuse Father    Cancer Maternal Aunt        stomach   Alcohol abuse Maternal Aunt    Alcohol abuse Mother    Heart disease  Brother        MI age 21- non smoker   Cancer Brother        Pancreatic   Cancer Brother        colon, dx'd age 27   Cancer Brother        stomach, paternal half brother   Breast cancer Neg Hx      ALLERGIES:  has no known allergies.  MEDICATIONS:  Current Outpatient Medications  Medication Sig Dispense Refill   albuterol  (PROAIR  HFA) 108 (90 Base) MCG/ACT inhaler Inhale 1-2 puffs into the lungs every 6 (six) hours as needed for wheezing or shortness of breath. 6.7 g 1   aspirin  EC 81 MG tablet Take 1 tablet (81 mg total) by mouth daily. Swallow whole. 30 tablet 12   atorvastatin  (LIPITOR) 20 MG tablet Take 1 tablet (20 mg total) by mouth daily. 90 tablet 1   budesonide-glycopyrrolate -formoterol (BREZTRI  AEROSPHERE) 160-9-4.8 MCG/ACT AERO inhaler Inhale 2 puffs into the lungs 2 (two) times daily. 10.7 g 11   esomeprazole  (NEXIUM ) 40 MG capsule Take 1 capsule (40 mg total) by mouth 2 (two) times daily before a meal. 180 capsule 0   gabapentin  (NEURONTIN ) 300 MG capsule Take 1-2 capsules (300-600 mg total) by mouth at bedtime. 180 capsule 2   HYDROcodone -acetaminophen  (NORCO) 7.5-325 MG tablet Take 1 tablet by mouth every 6 (six) hours as needed for moderate pain (pain score 4-6). 120 tablet 0   [START ON 05/12/2024] HYDROcodone -acetaminophen  (NORCO) 7.5-325 MG tablet Take 1 tablet by mouth every 6 (six) hours as needed for moderate pain (pain score 4-6). 120 tablet 0   [START ON 06/11/2024] HYDROcodone -acetaminophen  (NORCO) 7.5-325 MG tablet Take 1 tablet by mouth every 6 (six) hours as needed for moderate pain (pain score 4-6). 120 tablet 0   levothyroxine  (SYNTHROID ) 175 MCG tablet Take 1 tablet (175 mcg total) by mouth daily. 90 tablet 0   lisinopril -hydrochlorothiazide  (ZESTORETIC ) 10-12.5 MG tablet Take 1 tablet by mouth daily. 90 tablet 0   naloxone  (NARCAN ) nasal spray 4 mg/0.1 mL Place 1 spray into the nose as needed for up to 365 doses (for opioid-induced respiratory depresssion). In case of emergency (overdose), spray once into each nostril. If no response within 3 minutes, repeat application and call 911. 1 each 1   polyethylene glycol powder (GLYCOLAX /MIRALAX ) 17 GM/SCOOP powder  Take 17 g by mouth daily. 3350 g 1   rizatriptan  (MAXALT -MLT) 10 MG disintegrating tablet Take 1 tablet (10 mg total) by mouth as needed for migraine. May repeat in 2 hours if needed 10 tablet 2   linaclotide  (LINZESS ) 145 MCG CAPS capsule Take 1 capsule (145 mcg total) by mouth daily before breakfast. As needed. (Patient not taking: Reported on 04/19/2024) 90 capsule 1   No current facility-administered medications for this visit.    Review of Systems  Constitutional:  Positive for appetite change, fatigue and unexpected weight change. Negative for chills and fever.  HENT:   Negative for hearing loss and voice change.   Eyes:  Negative for eye problems.  Respiratory:  Negative for chest tightness and cough.   Cardiovascular:  Negative for chest pain.  Gastrointestinal:  Positive for nausea. Negative for abdominal distention, abdominal pain and blood in stool.  Endocrine: Negative for hot flashes.  Genitourinary:  Negative for difficulty urinating and frequency.   Musculoskeletal:  Positive for arthralgias.  Skin:  Negative for itching and rash.  Neurological:  Negative for extremity weakness.  Hematological:  Negative for adenopathy.  Psychiatric/Behavioral:  Negative for confusion.    PHYSICAL EXAMINATION:  Vitals:   04/26/24 0929  BP: 112/75  Pulse: 86  Resp: 18  SpO2: 100%   Filed Weights   04/26/24 0912  Weight: 190 lb (86.2 kg)    Physical Exam Constitutional:      General: She is not in acute distress. HENT:     Head: Normocephalic and atraumatic.  Eyes:     General: No scleral icterus. Cardiovascular:     Rate and Rhythm: Normal rate and regular rhythm.  Pulmonary:     Effort: Pulmonary effort is normal. No respiratory distress.     Breath sounds: Normal breath sounds. No wheezing.  Abdominal:     General: Bowel sounds are normal. There is no distension.     Palpations: Abdomen is soft.  Musculoskeletal:        General: No deformity. Normal range of motion.      Cervical back: Normal range of motion and neck supple.  Skin:    General: Skin is warm and dry.     Findings: No erythema or rash.  Neurological:     Mental Status: She is alert and oriented to person, place, and time. Mental status is at baseline.  Psychiatric:        Mood and Affect: Mood normal.     LABORATORY DATA:  I have reviewed the data as listed    Latest Ref Rng & Units 04/26/2024   10:04 AM 04/19/2024    9:19 AM 05/09/2023    2:59 PM  CBC  WBC 4.0 - 10.5 K/uL 6.5  5.5  7.1   Hemoglobin 12.0 - 15.0 g/dL 8.4  8.4  89.1   Hematocrit 36.0 - 46.0 % 28.9  28.5  34.8   Platelets 150 - 400 K/uL 176  190  194       Latest Ref Rng & Units 04/19/2024    9:19 AM 05/09/2023    2:59 PM 11/28/2022   11:45 AM  CMP  Glucose 65 - 99 mg/dL 73  69  76   BUN 7 - 25 mg/dL 23  20  23    Creatinine 0.60 - 1.00 mg/dL 8.86  8.82  8.75   Sodium 135 - 146 mmol/L 142  140  140   Potassium 3.5 - 5.3 mmol/L 4.0  4.0  4.0   Chloride 98 - 110 mmol/L 106  102  103   CO2 20 - 32 mmol/L 30  28  30    Calcium  8.6 - 10.4 mg/dL 9.3  89.6  9.3   Total Protein 6.1 - 8.1 g/dL 5.9  6.4    Total Bilirubin 0.2 - 1.2 mg/dL 0.2  0.4    AST 10 - 35 U/L 13  11    ALT 6 - 29 U/L 6  6        RADIOGRAPHIC STUDIES: I have personally reviewed the radiological images as listed and agreed with the findings in the report. No results found.       "

## 2024-04-26 NOTE — Assessment & Plan Note (Signed)
 Patient takes pain medication for chronic knee pain due to arthritis.

## 2024-04-26 NOTE — Assessment & Plan Note (Signed)
Encourage oral hydration and avoid nephrotoxins.  Check multiple myeloma panel, light chain ratio.

## 2024-04-26 NOTE — Assessment & Plan Note (Signed)
 B12 level is less than 400.  Recommend patient to start B12 1000 mcg daily.

## 2024-04-27 LAB — HAPTOGLOBIN: Haptoglobin: 127 mg/dL (ref 42–346)

## 2024-04-29 ENCOUNTER — Encounter: Payer: Self-pay | Admitting: Oncology

## 2024-04-29 LAB — MULTIPLE MYELOMA PANEL, SERUM
Albumin SerPl Elph-Mcnc: 3.5 g/dL (ref 2.9–4.4)
Albumin/Glob SerPl: 1.1 (ref 0.7–1.7)
Alpha 1: 0.3 g/dL (ref 0.0–0.4)
Alpha2 Glob SerPl Elph-Mcnc: 0.8 g/dL (ref 0.4–1.0)
B-Globulin SerPl Elph-Mcnc: 1.3 g/dL (ref 0.7–1.3)
Gamma Glob SerPl Elph-Mcnc: 0.9 g/dL (ref 0.4–1.8)
Globulin, Total: 3.2 g/dL (ref 2.2–3.9)
IgA: 430 mg/dL — ABNORMAL HIGH (ref 64–422)
IgG (Immunoglobin G), Serum: 897 mg/dL (ref 586–1602)
IgM (Immunoglobulin M), Srm: 104 mg/dL (ref 26–217)
Total Protein ELP: 6.7 g/dL (ref 6.0–8.5)

## 2024-04-29 LAB — KAPPA/LAMBDA LIGHT CHAINS
Kappa free light chain: 53.3 mg/L — ABNORMAL HIGH (ref 3.3–19.4)
Kappa, lambda light chain ratio: 1.36 (ref 0.26–1.65)
Lambda free light chains: 39.3 mg/L — ABNORMAL HIGH (ref 5.7–26.3)

## 2024-04-29 NOTE — Progress Notes (Signed)
 Spoke to pt and informed her of MD recommendations. Pt verbalized understanding. Call transferred to [facility] for scheduling.

## 2024-04-29 NOTE — Progress Notes (Signed)
Called pt, no answer.  Will try again.

## 2024-04-30 ENCOUNTER — Other Ambulatory Visit: Payer: Self-pay | Admitting: Nephrology

## 2024-04-30 DIAGNOSIS — N1831 Chronic kidney disease, stage 3a: Secondary | ICD-10-CM

## 2024-04-30 DIAGNOSIS — D638 Anemia in other chronic diseases classified elsewhere: Secondary | ICD-10-CM

## 2024-05-01 ENCOUNTER — Telehealth: Payer: Self-pay | Admitting: Oncology

## 2024-05-01 NOTE — Telephone Encounter (Signed)
 Pt called to confirm appt dates - [facility]

## 2024-05-06 ENCOUNTER — Inpatient Hospital Stay

## 2024-05-07 NOTE — Telephone Encounter (Signed)
 Reached out to [employer] for PAP application [employer]ess  and it is still under review. A letter was sent for income verification to patient- spoke to patient and she will provide document to forward to [employer] soon.

## 2024-05-08 ENCOUNTER — Other Ambulatory Visit: Payer: Self-pay | Admitting: Licensed Clinical Social Worker

## 2024-05-08 ENCOUNTER — Encounter: Payer: Self-pay | Admitting: Licensed Clinical Social Worker

## 2024-05-08 ENCOUNTER — Inpatient Hospital Stay

## 2024-05-08 ENCOUNTER — Inpatient Hospital Stay: Admitting: Licensed Clinical Social Worker

## 2024-05-08 DIAGNOSIS — Z1379 Encounter for other screening for genetic and chromosomal anomalies: Secondary | ICD-10-CM

## 2024-05-08 DIAGNOSIS — Z8542 Personal history of malignant neoplasm of other parts of uterus: Secondary | ICD-10-CM

## 2024-05-08 DIAGNOSIS — Z800 Family history of malignant neoplasm of digestive organs: Secondary | ICD-10-CM

## 2024-05-08 DIAGNOSIS — Z801 Family history of malignant neoplasm of trachea, bronchus and lung: Secondary | ICD-10-CM

## 2024-05-08 DIAGNOSIS — I129 Hypertensive chronic kidney disease with stage 1 through stage 4 chronic kidney disease, or unspecified chronic kidney disease: Secondary | ICD-10-CM | POA: Diagnosis not present

## 2024-05-08 LAB — GENETIC SCREENING ORDER

## 2024-05-08 NOTE — Progress Notes (Signed)
 REFERRING PROVIDER: Babara Call, MD [address] [place],  [place] [address]  PRIMARY PROVIDER:  Antonette Angeline ORN, NP  PRIMARY REASON FOR VISIT:  1. History of uterine cancer   2. Family history of pancreatic cancer   3. Family history of stomach cancer   4. Family history of lung cancer   5. Family history of liver cancer      HISTORY OF PRESENT ILLNESS:   Pamela Romero, a 74 y.o. female, was seen for a [facility] cancer genetics consultation at the request of Dr. Babara due to a personal and family history of cancer.  Pamela Romero presents to clinic today to discuss the possibility of a hereditary predisposition to cancer, genetic testing, and to further clarify her future cancer risks, as well as potential cancer risks for family members.   CANCER HISTORY:  In 2011, at the age of 82, Pamela Romero was diagnosed with endometrial cancer. This was treated with TAH-BSO and radiation.   RELEVANT MEDICAL HISTORY:  Menarche was at age 17.  Ovaries intact: no.  Hysterectomy: yes.  Menopausal status: postmenopausal.  Colonoscopy: no; patient unable to do. Number of breast biopsies: 1.  Past Medical History:  Diagnosis Date   Abnormal EKG    CAD (coronary artery disease)    non critical, by cardiac cath 2006   Cancer [facility])    endometrial Ca   COPD (chronic obstructive pulmonary disease) (HCC)    Endometrial adenocarcinoma (HCC) 2011   Hyperlipidemia    Hypertension    Osteoarthritis    Right foot, Left knee (Dr. Edie)   Reflux gastritis    Thyroid  disease    tobacco abuse    Vaginal delivery    x 2    Past Surgical History:  Procedure Laterality Date   BALLOON DILATION  11/07/2022   Procedure: BALLOON DILATION;  Surgeon: Therisa Bi, MD;  Location: [facility] ENDOSCOPY;  Service: Gastroenterology;;   BIOPSY  11/07/2022   Procedure: BIOPSY;  Surgeon: Therisa Bi, MD;  Location: [facility] ENDOSCOPY;  Service: Gastroenterology;;   BREAST CYST ASPIRATION Right 2014   neg   BREAST  SURGERY Right 06/2012   benign [place]   CARDIAC CATHETERIZATION  2006   40% LAD, EF 60%   ESOPHAGOGASTRODUODENOSCOPY N/A 04/07/2020   Procedure: ESOPHAGOGASTRODUODENOSCOPY (EGD);  Surgeon: Therisa Bi, MD;  Location: [facility] ENDOSCOPY;  Service: Gastroenterology;  Laterality: N/A;   ESOPHAGOGASTRODUODENOSCOPY (EGD) WITH PROPOFOL  N/A 11/07/2022   Procedure: ESOPHAGOGASTRODUODENOSCOPY (EGD) WITH PROPOFOL ;  Surgeon: Therisa Bi, MD;  Location: [facility] ENDOSCOPY;  Service: Gastroenterology;  Laterality: N/A;   POLYPECTOMY  11/07/2022   Procedure: POLYPECTOMY;  Surgeon: Therisa Bi, MD;  Location: [facility] ENDOSCOPY;  Service: Gastroenterology;;   THYROIDECTOMY  2005   partial at [facility], non malignant tumor   TOTAL ABDOMINAL HYSTERECTOMY     with BSO    FAMILY HISTORY:  We obtained a detailed, 4-generation family history.  Significant diagnoses are listed below: Family History  Problem Relation Age of Onset   Alcohol abuse Mother    Alcohol abuse Father    Heart disease Brother        MI age 67- non smoker   Pancreatic cancer Brother        Pancreatic   Liver cancer Brother        + bone   Liver cancer Brother    Stomach cancer Maternal Aunt        stomach   Alcohol abuse Maternal Aunt    Cancer Maternal Uncle  unk type   Stomach cancer Paternal Aunt    Lung cancer Paternal Aunt    Stomach cancer Paternal Uncle    Stomach cancer Half-Brother    Breast cancer Neg Hx      Brother - pancreatic cancer Brother - liver cancer and bone cancer Brother (living) - liver cancer Pat half brother - stomach cancer Maternal aunt- stomach cancer Maternal uncle- cancer, unknown type Maternal cousin - liver cancer Paternal aunt- stomach cancer Paternal uncle- stomach cancer Paternal aunt- lung cancer  Pamela Romero is unaware of previous family history of genetic testing for hereditary cancer risks. There is no reported Ashkenazi Jewish ancestry. There is no known consanguinity.  GENETIC  COUNSELING ASSESSMENT: Pamela Romero is a 74 y.o. female with a personal and family history of cancer which is somewhat suggestive of a hereditary cancer syndrome and predisposition to cancer. We, therefore, discussed and recommended the following at today's visit.   DISCUSSION: We discussed that, in general, most cancer is not inherited in families, but instead is sporadic or familial. We discussed that approximately 10% of cancer is hereditary. Most cases of hereditary endometrial cancer are associated with Lynch genes, which can also increase risk for pancreatic and stomach cancer, although there are other genes associated with hereditary cancer as well. Cancers and risks are gene specific. We discussed that testing is beneficial for several reasons including knowing about cancer risks, identifying potential screening and risk-reduction options that may be appropriate, and to understand if other family members could be at risk for cancer and allow them to undergo genetic testing.   We reviewed the characteristics, features and inheritance patterns of hereditary cancer syndromes. We also discussed genetic testing, including the appropriate family members to test, the process of testing, insurance coverage and turn-around-time for results. We discussed the implications of a negative, positive and/or variant of uncertain significant result. We recommended Ms. Boisselle pursue genetic testing for the [facility] Common Hereditary Cancers+RNA gene panel.   The Common Hereditary Cancers Panel + RNA offered by [facility] includes sequencing and/or deletion duplication testing of the following 48 genes: APC*, ATM*, AXIN2, BAP1, BARD1, BMPR1A, BRCA1, BRCA2, BRIP1, CDH1, CDK4, CDKN2A (p14ARF), CDKN2A (p16INK4a), CHEK2, CTNNA1, DICER1*, EPCAM*, FH*, GREM1*, HOXB13, KIT, MBD4, MEN1*, MLH1*, MSH2*, MSH3*, MSH6*, MUTYH, NF1*, NTHL1, PALB2, PDGFRA, PMS2*, POLD1*, POLE, PTEN*, RAD51C, RAD51D, SDHA*, SDHB, SDHC*, SDHD, SMAD4,  SMARCA4, STK11, TP53, TSC1*, TSC2, VHL.   Based on Ms. Aarons's personal and family history of cancer, she meets medical criteria for genetic testing. Despite that she meets criteria, she may still have an out of pocket cost.   PLAN: After considering the risks, benefits, and limitations, Ms. Diss provided informed consent to pursue genetic testing and the blood sample was sent to [facility] for analysis of the Common Hereditary Cancers+RNA Panel. Results should be available within approximately 2-3 weeks' time, at which point they will be disclosed by telephone to Ms. Slay, as will any additional recommendations warranted by these results. Ms. Baruch will receive a summary of her genetic counseling visit and a copy of her results once available. This information will also be available in Epic.   Ms. Nunley questions were answered to her satisfaction today. Our contact information was provided should additional questions or concerns arise. Thank you for the referral and allowing us  to share in the care of your patient.   Dena Cary, MS, [facility] Genetic Counselor [email or web address] Phone: [phone]  I personally spent a total of 45 minutes in the care of the  patient today including getting/reviewing separately obtained history, counseling and educating, placing orders, and documenting clinical information in the EHR. Dr. Delinda was available for discussion regarding this case.   _______________________________________________________________________ For Office Staff:  Number of people involved in session: 1 Was an Intern/ student involved with case: no

## 2024-05-08 NOTE — Progress Notes (Signed)
 Genetic

## 2024-05-09 ENCOUNTER — Inpatient Hospital Stay

## 2024-05-10 ENCOUNTER — Ambulatory Visit: Admission: RE | Admit: 2024-05-10 | Source: Ambulatory Visit

## 2024-05-10 ENCOUNTER — Ambulatory Visit
Admission: RE | Admit: 2024-05-10 | Discharge: 2024-05-10 | Disposition: A | Source: Ambulatory Visit | Attending: Nephrology

## 2024-05-10 DIAGNOSIS — M150 Primary generalized (osteo)arthritis: Secondary | ICD-10-CM | POA: Insufficient documentation

## 2024-05-10 DIAGNOSIS — E785 Hyperlipidemia, unspecified: Secondary | ICD-10-CM | POA: Diagnosis not present

## 2024-05-10 DIAGNOSIS — I2510 Atherosclerotic heart disease of native coronary artery without angina pectoris: Secondary | ICD-10-CM | POA: Diagnosis not present

## 2024-05-10 DIAGNOSIS — I129 Hypertensive chronic kidney disease with stage 1 through stage 4 chronic kidney disease, or unspecified chronic kidney disease: Secondary | ICD-10-CM | POA: Insufficient documentation

## 2024-05-10 DIAGNOSIS — K219 Gastro-esophageal reflux disease without esophagitis: Secondary | ICD-10-CM | POA: Diagnosis not present

## 2024-05-10 DIAGNOSIS — D631 Anemia in chronic kidney disease: Secondary | ICD-10-CM | POA: Insufficient documentation

## 2024-05-10 DIAGNOSIS — J449 Chronic obstructive pulmonary disease, unspecified: Secondary | ICD-10-CM | POA: Insufficient documentation

## 2024-05-10 DIAGNOSIS — N1831 Chronic kidney disease, stage 3a: Secondary | ICD-10-CM | POA: Diagnosis present

## 2024-05-10 DIAGNOSIS — D638 Anemia in other chronic diseases classified elsewhere: Secondary | ICD-10-CM

## 2024-05-12 ENCOUNTER — Telehealth: Payer: Self-pay | Admitting: Oncology

## 2024-05-12 NOTE — Telephone Encounter (Signed)
 Called patient to reschedule due to clinic closed- phone rang to hang up- unable to leave voicemail

## 2024-05-13 ENCOUNTER — Inpatient Hospital Stay: Attending: Oncology

## 2024-05-14 ENCOUNTER — Telehealth: Payer: Self-pay | Admitting: Oncology

## 2024-05-14 NOTE — Telephone Encounter (Signed)
 Pt spouse called to confirm next appt date - [facility]

## 2024-05-16 ENCOUNTER — Inpatient Hospital Stay

## 2024-05-20 ENCOUNTER — Inpatient Hospital Stay

## 2024-05-20 ENCOUNTER — Other Ambulatory Visit

## 2024-05-22 ENCOUNTER — Inpatient Hospital Stay

## 2024-05-27 ENCOUNTER — Inpatient Hospital Stay

## 2024-05-29 ENCOUNTER — Inpatient Hospital Stay

## 2024-07-01 ENCOUNTER — Encounter: Admitting: Nurse Practitioner

## 2024-07-22 ENCOUNTER — Ambulatory Visit: Admitting: Internal Medicine

## 2024-07-23 ENCOUNTER — Inpatient Hospital Stay

## 2024-07-25 ENCOUNTER — Inpatient Hospital Stay

## 2024-07-25 ENCOUNTER — Inpatient Hospital Stay: Admitting: Oncology

## 2024-10-17 ENCOUNTER — Encounter: Admitting: Internal Medicine
